# Patient Record
Sex: Female | Born: 1935 | Race: Black or African American | Hispanic: No | Marital: Single | Smoking: Never smoker
Health system: Southern US, Community
[De-identification: ages and names within clinical notes are randomized; demographics above are authoritative.]

## PROBLEM LIST (undated history)

## (undated) DIAGNOSIS — M5430 Sciatica, unspecified side: Secondary | ICD-10-CM

## (undated) DIAGNOSIS — E119 Type 2 diabetes mellitus without complications: Secondary | ICD-10-CM

## (undated) DIAGNOSIS — E785 Hyperlipidemia, unspecified: Secondary | ICD-10-CM

## (undated) DIAGNOSIS — M79606 Pain in leg, unspecified: Secondary | ICD-10-CM

## (undated) DIAGNOSIS — F0390 Unspecified dementia without behavioral disturbance: Secondary | ICD-10-CM

## (undated) DIAGNOSIS — E039 Hypothyroidism, unspecified: Secondary | ICD-10-CM

## (undated) DIAGNOSIS — I10 Essential (primary) hypertension: Secondary | ICD-10-CM

## (undated) DIAGNOSIS — I639 Cerebral infarction, unspecified: Secondary | ICD-10-CM

## (undated) DIAGNOSIS — K219 Gastro-esophageal reflux disease without esophagitis: Secondary | ICD-10-CM

## (undated) DIAGNOSIS — K449 Diaphragmatic hernia without obstruction or gangrene: Secondary | ICD-10-CM

## (undated) DIAGNOSIS — G5600 Carpal tunnel syndrome, unspecified upper limb: Secondary | ICD-10-CM

## (undated) DIAGNOSIS — R1310 Dysphagia, unspecified: Secondary | ICD-10-CM

## (undated) DIAGNOSIS — Z86010 Personal history of colonic polyps: Secondary | ICD-10-CM

## (undated) DIAGNOSIS — R42 Dizziness and giddiness: Secondary | ICD-10-CM

## (undated) DIAGNOSIS — B351 Tinea unguium: Principal | ICD-10-CM

## (undated) HISTORY — DX: Dizziness and giddiness: R42

## (undated) HISTORY — DX: Gastro-esophageal reflux disease without esophagitis: K21.9

## (undated) HISTORY — DX: Hyperlipidemia, unspecified: E78.5

## (undated) HISTORY — DX: Carpal tunnel syndrome, unspecified upper limb: G56.00

## (undated) HISTORY — DX: Pain in leg, unspecified: M79.606

## (undated) HISTORY — DX: Personal history of colonic polyps: Z86.010

## (undated) HISTORY — DX: Unspecified dementia, unspecified severity, without behavioral disturbance, psychotic disturbance, mood disturbance, and anxiety: F03.90

## (undated) HISTORY — DX: Sciatica, unspecified side: M54.30

## (undated) HISTORY — DX: Dysphagia, unspecified: R13.10

## (undated) HISTORY — PX: BLADDER SURGERY: SHX569

## (undated) HISTORY — PX: LARYNX SURGERY: SHX692

## (undated) HISTORY — DX: Tinea unguium: B35.1

## (undated) HISTORY — DX: Hypothyroidism, unspecified: E03.9

## (undated) HISTORY — PX: COLONOSCOPY: SHX174

---

## 1974-01-12 HISTORY — PX: VESICOVAGINAL FISTULA CLOSURE W/ TAH: SUR271

## 1976-01-13 HISTORY — PX: PARTIAL HYSTERECTOMY: SHX80

## 1997-03-29 ENCOUNTER — Ambulatory Visit (HOSPITAL_COMMUNITY): Admission: RE | Admit: 1997-03-29 | Discharge: 1997-03-29 | Payer: Self-pay | Admitting: Internal Medicine

## 1998-11-05 ENCOUNTER — Encounter: Payer: Self-pay | Admitting: Internal Medicine

## 1998-11-05 ENCOUNTER — Ambulatory Visit (HOSPITAL_COMMUNITY): Admission: RE | Admit: 1998-11-05 | Discharge: 1998-11-05 | Payer: Self-pay | Admitting: Internal Medicine

## 2000-07-03 ENCOUNTER — Encounter: Payer: Self-pay | Admitting: Emergency Medicine

## 2000-07-03 ENCOUNTER — Emergency Department (HOSPITAL_COMMUNITY): Admission: EM | Admit: 2000-07-03 | Discharge: 2000-07-03 | Payer: Self-pay

## 2001-02-09 ENCOUNTER — Ambulatory Visit (HOSPITAL_COMMUNITY): Admission: RE | Admit: 2001-02-09 | Discharge: 2001-02-09 | Payer: Self-pay | Admitting: Family Medicine

## 2001-02-09 ENCOUNTER — Encounter: Payer: Self-pay | Admitting: Family Medicine

## 2001-06-08 ENCOUNTER — Encounter: Payer: Self-pay | Admitting: Internal Medicine

## 2001-06-08 ENCOUNTER — Ambulatory Visit: Admission: RE | Admit: 2001-06-08 | Discharge: 2001-06-08 | Payer: Self-pay | Admitting: Internal Medicine

## 2001-07-02 ENCOUNTER — Ambulatory Visit (HOSPITAL_COMMUNITY): Admission: RE | Admit: 2001-07-02 | Discharge: 2001-07-02 | Payer: Self-pay | Admitting: Internal Medicine

## 2001-07-02 ENCOUNTER — Encounter: Payer: Self-pay | Admitting: Internal Medicine

## 2002-01-07 ENCOUNTER — Emergency Department (HOSPITAL_COMMUNITY): Admission: EM | Admit: 2002-01-07 | Discharge: 2002-01-07 | Payer: Self-pay | Admitting: *Deleted

## 2002-01-09 ENCOUNTER — Emergency Department (HOSPITAL_COMMUNITY): Admission: EM | Admit: 2002-01-09 | Discharge: 2002-01-09 | Payer: Self-pay | Admitting: Emergency Medicine

## 2002-01-09 ENCOUNTER — Encounter: Payer: Self-pay | Admitting: Emergency Medicine

## 2002-04-03 ENCOUNTER — Emergency Department (HOSPITAL_COMMUNITY): Admission: EM | Admit: 2002-04-03 | Discharge: 2002-04-03 | Payer: Self-pay | Admitting: Emergency Medicine

## 2002-06-09 ENCOUNTER — Emergency Department (HOSPITAL_COMMUNITY): Admission: EM | Admit: 2002-06-09 | Discharge: 2002-06-09 | Payer: Self-pay | Admitting: Emergency Medicine

## 2003-06-06 ENCOUNTER — Emergency Department (HOSPITAL_COMMUNITY): Admission: EM | Admit: 2003-06-06 | Discharge: 2003-06-07 | Payer: Self-pay | Admitting: Emergency Medicine

## 2003-11-22 ENCOUNTER — Ambulatory Visit: Payer: Self-pay | Admitting: Family Medicine

## 2003-12-03 ENCOUNTER — Ambulatory Visit (HOSPITAL_COMMUNITY): Admission: RE | Admit: 2003-12-03 | Discharge: 2003-12-03 | Payer: Self-pay | Admitting: Internal Medicine

## 2003-12-03 ENCOUNTER — Ambulatory Visit: Payer: Self-pay | Admitting: Family Medicine

## 2004-02-04 ENCOUNTER — Ambulatory Visit: Payer: Self-pay | Admitting: Family Medicine

## 2004-05-19 ENCOUNTER — Ambulatory Visit: Payer: Self-pay | Admitting: Family Medicine

## 2004-06-02 ENCOUNTER — Ambulatory Visit: Payer: Self-pay | Admitting: Family Medicine

## 2004-06-13 ENCOUNTER — Ambulatory Visit: Payer: Self-pay | Admitting: Family Medicine

## 2004-06-26 ENCOUNTER — Ambulatory Visit (HOSPITAL_COMMUNITY): Admission: RE | Admit: 2004-06-26 | Discharge: 2004-06-26 | Payer: Self-pay | Admitting: Family Medicine

## 2004-07-04 ENCOUNTER — Encounter: Admission: RE | Admit: 2004-07-04 | Discharge: 2004-07-04 | Payer: Self-pay | Admitting: Family Medicine

## 2004-08-27 ENCOUNTER — Emergency Department (HOSPITAL_COMMUNITY): Admission: EM | Admit: 2004-08-27 | Discharge: 2004-08-27 | Payer: Self-pay | Admitting: Family Medicine

## 2004-09-10 ENCOUNTER — Ambulatory Visit: Payer: Self-pay | Admitting: Family Medicine

## 2004-09-29 ENCOUNTER — Ambulatory Visit: Payer: Self-pay | Admitting: Nurse Practitioner

## 2004-10-09 ENCOUNTER — Ambulatory Visit: Payer: Self-pay | Admitting: Family Medicine

## 2005-01-27 ENCOUNTER — Ambulatory Visit: Payer: Self-pay | Admitting: Internal Medicine

## 2005-02-26 ENCOUNTER — Encounter: Admission: RE | Admit: 2005-02-26 | Discharge: 2005-02-26 | Payer: Self-pay | Admitting: Family Medicine

## 2005-03-05 ENCOUNTER — Ambulatory Visit: Payer: Self-pay | Admitting: Family Medicine

## 2005-03-30 ENCOUNTER — Ambulatory Visit: Payer: Self-pay | Admitting: Family Medicine

## 2005-06-02 ENCOUNTER — Ambulatory Visit: Payer: Self-pay | Admitting: Family Medicine

## 2005-08-25 ENCOUNTER — Encounter: Admission: RE | Admit: 2005-08-25 | Discharge: 2005-08-25 | Payer: Self-pay | Admitting: Family Medicine

## 2005-10-28 ENCOUNTER — Ambulatory Visit: Payer: Self-pay | Admitting: Family Medicine

## 2005-11-05 ENCOUNTER — Ambulatory Visit: Payer: Self-pay | Admitting: Family Medicine

## 2005-12-01 ENCOUNTER — Emergency Department (HOSPITAL_COMMUNITY): Admission: EM | Admit: 2005-12-01 | Discharge: 2005-12-01 | Payer: Self-pay | Admitting: Family Medicine

## 2006-02-24 ENCOUNTER — Ambulatory Visit: Payer: Self-pay | Admitting: Family Medicine

## 2006-06-23 DIAGNOSIS — K589 Irritable bowel syndrome without diarrhea: Secondary | ICD-10-CM | POA: Insufficient documentation

## 2006-06-23 DIAGNOSIS — E785 Hyperlipidemia, unspecified: Secondary | ICD-10-CM | POA: Insufficient documentation

## 2006-06-23 DIAGNOSIS — E119 Type 2 diabetes mellitus without complications: Secondary | ICD-10-CM | POA: Insufficient documentation

## 2006-06-23 DIAGNOSIS — E039 Hypothyroidism, unspecified: Secondary | ICD-10-CM | POA: Insufficient documentation

## 2006-06-23 DIAGNOSIS — I10 Essential (primary) hypertension: Secondary | ICD-10-CM | POA: Insufficient documentation

## 2006-06-23 DIAGNOSIS — K219 Gastro-esophageal reflux disease without esophagitis: Secondary | ICD-10-CM | POA: Insufficient documentation

## 2006-09-10 ENCOUNTER — Emergency Department (HOSPITAL_COMMUNITY): Admission: EM | Admit: 2006-09-10 | Discharge: 2006-09-10 | Payer: Self-pay | Admitting: Family Medicine

## 2006-09-29 ENCOUNTER — Ambulatory Visit: Payer: Self-pay | Admitting: Internal Medicine

## 2006-10-05 ENCOUNTER — Encounter: Admission: RE | Admit: 2006-10-05 | Discharge: 2006-10-05 | Payer: Self-pay | Admitting: Family Medicine

## 2007-03-15 ENCOUNTER — Emergency Department (HOSPITAL_COMMUNITY): Admission: EM | Admit: 2007-03-15 | Discharge: 2007-03-15 | Payer: Self-pay | Admitting: Emergency Medicine

## 2007-10-06 ENCOUNTER — Encounter: Admission: RE | Admit: 2007-10-06 | Discharge: 2007-10-06 | Payer: Self-pay | Admitting: Internal Medicine

## 2007-10-24 ENCOUNTER — Encounter: Admission: RE | Admit: 2007-10-24 | Discharge: 2007-10-24 | Payer: Self-pay | Admitting: Internal Medicine

## 2008-02-07 ENCOUNTER — Emergency Department (HOSPITAL_BASED_OUTPATIENT_CLINIC_OR_DEPARTMENT_OTHER): Admission: EM | Admit: 2008-02-07 | Discharge: 2008-02-07 | Payer: Self-pay | Admitting: Emergency Medicine

## 2008-02-07 ENCOUNTER — Ambulatory Visit: Payer: Self-pay | Admitting: Radiology

## 2008-09-09 ENCOUNTER — Emergency Department (HOSPITAL_COMMUNITY): Admission: EM | Admit: 2008-09-09 | Discharge: 2008-09-09 | Payer: Self-pay | Admitting: Emergency Medicine

## 2008-09-21 ENCOUNTER — Emergency Department (HOSPITAL_COMMUNITY): Admission: EM | Admit: 2008-09-21 | Discharge: 2008-09-21 | Payer: Self-pay | Admitting: Emergency Medicine

## 2008-10-15 ENCOUNTER — Encounter: Admission: RE | Admit: 2008-10-15 | Discharge: 2008-10-15 | Payer: Self-pay | Admitting: Internal Medicine

## 2009-07-08 ENCOUNTER — Emergency Department (HOSPITAL_COMMUNITY): Admission: EM | Admit: 2009-07-08 | Discharge: 2009-07-08 | Payer: Self-pay | Admitting: Emergency Medicine

## 2009-10-16 ENCOUNTER — Encounter: Admission: RE | Admit: 2009-10-16 | Discharge: 2009-10-16 | Payer: Self-pay | Admitting: Internal Medicine

## 2010-03-08 ENCOUNTER — Emergency Department (HOSPITAL_BASED_OUTPATIENT_CLINIC_OR_DEPARTMENT_OTHER)
Admission: EM | Admit: 2010-03-08 | Discharge: 2010-03-08 | Disposition: A | Discharge status: Home / Self Care | Payer: Medicare Other | Attending: Emergency Medicine | Admitting: Emergency Medicine

## 2010-03-08 DIAGNOSIS — R197 Diarrhea, unspecified: Secondary | ICD-10-CM | POA: Insufficient documentation

## 2010-03-08 DIAGNOSIS — K219 Gastro-esophageal reflux disease without esophagitis: Secondary | ICD-10-CM | POA: Insufficient documentation

## 2010-03-08 DIAGNOSIS — R6883 Chills (without fever): Secondary | ICD-10-CM | POA: Insufficient documentation

## 2010-03-08 DIAGNOSIS — E7800 Pure hypercholesterolemia, unspecified: Secondary | ICD-10-CM | POA: Insufficient documentation

## 2010-03-08 DIAGNOSIS — E039 Hypothyroidism, unspecified: Secondary | ICD-10-CM | POA: Insufficient documentation

## 2010-03-08 DIAGNOSIS — K5289 Other specified noninfective gastroenteritis and colitis: Secondary | ICD-10-CM | POA: Insufficient documentation

## 2010-03-08 DIAGNOSIS — I10 Essential (primary) hypertension: Secondary | ICD-10-CM | POA: Insufficient documentation

## 2010-03-08 DIAGNOSIS — R109 Unspecified abdominal pain: Secondary | ICD-10-CM | POA: Insufficient documentation

## 2010-03-08 DIAGNOSIS — E119 Type 2 diabetes mellitus without complications: Secondary | ICD-10-CM | POA: Insufficient documentation

## 2010-03-08 DIAGNOSIS — R1110 Vomiting, unspecified: Secondary | ICD-10-CM | POA: Insufficient documentation

## 2010-03-08 LAB — GLUCOSE, CAPILLARY: Glucose-Capillary: 201 mg/dL — ABNORMAL HIGH (ref 70–99)

## 2010-03-20 ENCOUNTER — Ambulatory Visit (INDEPENDENT_AMBULATORY_CARE_PROVIDER_SITE_OTHER): Payer: Medicare Other

## 2010-03-20 ENCOUNTER — Inpatient Hospital Stay (INDEPENDENT_AMBULATORY_CARE_PROVIDER_SITE_OTHER)
Admission: RE | Admit: 2010-03-20 | Discharge: 2010-03-20 | Disposition: A | Discharge status: Home / Self Care | Payer: Medicare Other | Source: Ambulatory Visit | Attending: Emergency Medicine | Admitting: Emergency Medicine

## 2010-03-20 DIAGNOSIS — T148XXA Other injury of unspecified body region, initial encounter: Secondary | ICD-10-CM

## 2010-04-15 ENCOUNTER — Ambulatory Visit (HOSPITAL_BASED_OUTPATIENT_CLINIC_OR_DEPARTMENT_OTHER)
Admission: RE | Admit: 2010-04-15 | Discharge: 2010-04-15 | Disposition: A | Discharge status: Home / Self Care | Payer: Medicare Other | Source: Ambulatory Visit | Attending: Internal Medicine | Admitting: Internal Medicine

## 2010-04-15 ENCOUNTER — Other Ambulatory Visit (HOSPITAL_BASED_OUTPATIENT_CLINIC_OR_DEPARTMENT_OTHER): Payer: Self-pay | Admitting: Internal Medicine

## 2010-04-15 DIAGNOSIS — M79669 Pain in unspecified lower leg: Secondary | ICD-10-CM

## 2010-04-15 DIAGNOSIS — R609 Edema, unspecified: Secondary | ICD-10-CM | POA: Insufficient documentation

## 2010-04-15 DIAGNOSIS — M79609 Pain in unspecified limb: Secondary | ICD-10-CM

## 2010-04-15 DIAGNOSIS — R252 Cramp and spasm: Secondary | ICD-10-CM | POA: Insufficient documentation

## 2010-09-22 ENCOUNTER — Inpatient Hospital Stay (INDEPENDENT_AMBULATORY_CARE_PROVIDER_SITE_OTHER)
Admission: RE | Admit: 2010-09-22 | Discharge: 2010-09-22 | Disposition: A | Discharge status: Home / Self Care | Payer: Medicare Other | Source: Ambulatory Visit | Attending: Family Medicine | Admitting: Family Medicine

## 2010-09-22 DIAGNOSIS — L259 Unspecified contact dermatitis, unspecified cause: Secondary | ICD-10-CM

## 2010-09-26 ENCOUNTER — Other Ambulatory Visit: Payer: Self-pay | Admitting: Internal Medicine

## 2010-09-26 DIAGNOSIS — Z1231 Encounter for screening mammogram for malignant neoplasm of breast: Secondary | ICD-10-CM

## 2010-10-21 ENCOUNTER — Ambulatory Visit
Admission: RE | Admit: 2010-10-21 | Discharge: 2010-10-21 | Disposition: A | Discharge status: Home / Self Care | Payer: Medicare Other | Source: Ambulatory Visit | Attending: Internal Medicine | Admitting: Internal Medicine

## 2010-10-21 DIAGNOSIS — Z1231 Encounter for screening mammogram for malignant neoplasm of breast: Secondary | ICD-10-CM

## 2010-11-04 ENCOUNTER — Ambulatory Visit: Payer: Medicare Other | Admitting: *Deleted

## 2010-11-11 ENCOUNTER — Encounter: Payer: Medicare Other | Attending: Internal Medicine | Admitting: *Deleted

## 2010-11-11 ENCOUNTER — Encounter: Payer: Self-pay | Admitting: *Deleted

## 2010-11-11 DIAGNOSIS — Z713 Dietary counseling and surveillance: Secondary | ICD-10-CM | POA: Insufficient documentation

## 2010-11-11 DIAGNOSIS — E119 Type 2 diabetes mellitus without complications: Secondary | ICD-10-CM

## 2010-11-11 NOTE — Progress Notes (Signed)
  Medical Nutrition Therapy:  Appt start time: 1130 end time:  1230.   Assessment:  Primary concerns today: Nutrition counseling to review management options for Type 2 Diabetes. Patient states she has has Diabetes for over 20 years but needs refresher. She has a Primary school teacher but doesn't know how to use it. She has a physically active lifestyle but not an exercise routine. She eats 3 meals/day and snacks occasionally. She takes her diabetes meds in the AM but often omits the evening dose, too busy and kind of afraid to take it when heading to bed.  MEDICATIONS: see list. Diabetes med is Glucovance5-500 mg, 2 tabs BID   DIETARY INTAKE:  Usual eating pattern includes 3 meals and 3 snacks per day.  Everyday foods include good variety of all food groups plus regular soda daily.    24-hr recall:  B ( AM): oats or grits, toast and coffee. Enjoys orange juice  Snk ( AM): applesauce  L ( PM): left overs or cereal with milk and a banana Snk ( PM): sardines with vinegar, regular Pepsi D ( PM): meat, starch, vegetable meal, enjoys fish Snk ( PM): grilled cheese sandwich, fruit, regular Pepsi Beverages: regular Pepsi, water, tea, water  Usual physical activity: daily living such as house and yard work  Estimated energy needs: 1600 calories 180 g carbohydrates 120 g protein 44 g fat  Progress Towards Goal(s):  In progress.   Nutritional Diagnosis:  NI-5.8.2 Excessive carbohydrate intake As related to intake of regular soda and multiple high carb foods.  As evidenced by  A1c of 9.9%.    Intervention:  Nutrition counseling on physiology of diabetes, relationship of carbohydrate and insulin to BG numbers and A1c, value of testing BG daily at various times of day and action of her Glucovance encouraging her to take it as prescribed.  Handouts given during visit include:  Where Do I Begin by ADA  Monitoring/Evaluation:  Dietary intake of 3 Carb Choices per meal, exercise as usual,  avoidance of regular soda, and body weight in 2 week(s).

## 2011-02-18 DIAGNOSIS — B351 Tinea unguium: Secondary | ICD-10-CM | POA: Diagnosis not present

## 2011-02-18 DIAGNOSIS — L608 Other nail disorders: Secondary | ICD-10-CM | POA: Diagnosis not present

## 2011-02-18 DIAGNOSIS — E119 Type 2 diabetes mellitus without complications: Secondary | ICD-10-CM | POA: Diagnosis not present

## 2011-02-18 DIAGNOSIS — M79609 Pain in unspecified limb: Secondary | ICD-10-CM | POA: Diagnosis not present

## 2011-02-20 DIAGNOSIS — R3913 Splitting of urinary stream: Secondary | ICD-10-CM | POA: Diagnosis not present

## 2011-02-20 DIAGNOSIS — N8111 Cystocele, midline: Secondary | ICD-10-CM | POA: Diagnosis not present

## 2011-02-20 DIAGNOSIS — N952 Postmenopausal atrophic vaginitis: Secondary | ICD-10-CM | POA: Diagnosis not present

## 2011-04-02 DIAGNOSIS — E1142 Type 2 diabetes mellitus with diabetic polyneuropathy: Secondary | ICD-10-CM | POA: Diagnosis not present

## 2011-04-02 DIAGNOSIS — E1159 Type 2 diabetes mellitus with other circulatory complications: Secondary | ICD-10-CM | POA: Diagnosis not present

## 2011-04-02 DIAGNOSIS — J309 Allergic rhinitis, unspecified: Secondary | ICD-10-CM | POA: Diagnosis not present

## 2011-04-02 DIAGNOSIS — IMO0001 Reserved for inherently not codable concepts without codable children: Secondary | ICD-10-CM | POA: Diagnosis not present

## 2011-04-02 DIAGNOSIS — K219 Gastro-esophageal reflux disease without esophagitis: Secondary | ICD-10-CM | POA: Diagnosis not present

## 2011-04-02 DIAGNOSIS — E785 Hyperlipidemia, unspecified: Secondary | ICD-10-CM | POA: Diagnosis not present

## 2011-04-02 DIAGNOSIS — E039 Hypothyroidism, unspecified: Secondary | ICD-10-CM | POA: Diagnosis not present

## 2011-04-02 DIAGNOSIS — I10 Essential (primary) hypertension: Secondary | ICD-10-CM | POA: Diagnosis not present

## 2011-04-08 DIAGNOSIS — R339 Retention of urine, unspecified: Secondary | ICD-10-CM | POA: Diagnosis not present

## 2011-04-08 DIAGNOSIS — N8111 Cystocele, midline: Secondary | ICD-10-CM | POA: Diagnosis not present

## 2011-04-08 DIAGNOSIS — E119 Type 2 diabetes mellitus without complications: Secondary | ICD-10-CM | POA: Diagnosis not present

## 2011-04-16 DIAGNOSIS — IMO0001 Reserved for inherently not codable concepts without codable children: Secondary | ICD-10-CM | POA: Diagnosis not present

## 2011-04-16 DIAGNOSIS — E785 Hyperlipidemia, unspecified: Secondary | ICD-10-CM | POA: Diagnosis not present

## 2011-04-16 DIAGNOSIS — E1142 Type 2 diabetes mellitus with diabetic polyneuropathy: Secondary | ICD-10-CM | POA: Diagnosis not present

## 2011-04-16 DIAGNOSIS — E039 Hypothyroidism, unspecified: Secondary | ICD-10-CM | POA: Diagnosis not present

## 2011-04-16 DIAGNOSIS — K219 Gastro-esophageal reflux disease without esophagitis: Secondary | ICD-10-CM | POA: Diagnosis not present

## 2011-04-16 DIAGNOSIS — J309 Allergic rhinitis, unspecified: Secondary | ICD-10-CM | POA: Diagnosis not present

## 2011-04-16 DIAGNOSIS — I10 Essential (primary) hypertension: Secondary | ICD-10-CM | POA: Diagnosis not present

## 2011-05-13 DIAGNOSIS — M79609 Pain in unspecified limb: Secondary | ICD-10-CM | POA: Diagnosis not present

## 2011-05-13 DIAGNOSIS — L608 Other nail disorders: Secondary | ICD-10-CM | POA: Diagnosis not present

## 2011-05-13 DIAGNOSIS — L600 Ingrowing nail: Secondary | ICD-10-CM | POA: Diagnosis not present

## 2011-05-13 DIAGNOSIS — E119 Type 2 diabetes mellitus without complications: Secondary | ICD-10-CM | POA: Diagnosis not present

## 2011-05-22 DIAGNOSIS — N8111 Cystocele, midline: Secondary | ICD-10-CM | POA: Diagnosis not present

## 2011-05-22 DIAGNOSIS — R339 Retention of urine, unspecified: Secondary | ICD-10-CM | POA: Diagnosis not present

## 2011-05-22 DIAGNOSIS — N993 Prolapse of vaginal vault after hysterectomy: Secondary | ICD-10-CM | POA: Diagnosis not present

## 2011-06-04 DIAGNOSIS — Z90710 Acquired absence of both cervix and uterus: Secondary | ICD-10-CM | POA: Diagnosis not present

## 2011-06-04 DIAGNOSIS — R32 Unspecified urinary incontinence: Secondary | ICD-10-CM | POA: Insufficient documentation

## 2011-06-04 DIAGNOSIS — K219 Gastro-esophageal reflux disease without esophagitis: Secondary | ICD-10-CM | POA: Diagnosis not present

## 2011-06-04 DIAGNOSIS — F411 Generalized anxiety disorder: Secondary | ICD-10-CM | POA: Diagnosis not present

## 2011-06-04 DIAGNOSIS — Z01818 Encounter for other preprocedural examination: Secondary | ICD-10-CM | POA: Diagnosis not present

## 2011-06-04 DIAGNOSIS — Z79899 Other long term (current) drug therapy: Secondary | ICD-10-CM | POA: Diagnosis not present

## 2011-06-04 DIAGNOSIS — E119 Type 2 diabetes mellitus without complications: Secondary | ICD-10-CM | POA: Diagnosis not present

## 2011-06-04 DIAGNOSIS — E785 Hyperlipidemia, unspecified: Secondary | ICD-10-CM | POA: Diagnosis not present

## 2011-06-04 DIAGNOSIS — R339 Retention of urine, unspecified: Secondary | ICD-10-CM | POA: Diagnosis not present

## 2011-06-04 DIAGNOSIS — E039 Hypothyroidism, unspecified: Secondary | ICD-10-CM | POA: Diagnosis not present

## 2011-06-04 DIAGNOSIS — F329 Major depressive disorder, single episode, unspecified: Secondary | ICD-10-CM | POA: Diagnosis not present

## 2011-06-04 DIAGNOSIS — I10 Essential (primary) hypertension: Secondary | ICD-10-CM | POA: Diagnosis not present

## 2011-06-04 DIAGNOSIS — K5900 Constipation, unspecified: Secondary | ICD-10-CM | POA: Diagnosis not present

## 2011-06-04 DIAGNOSIS — Z885 Allergy status to narcotic agent status: Secondary | ICD-10-CM | POA: Diagnosis not present

## 2011-06-04 DIAGNOSIS — M5430 Sciatica, unspecified side: Secondary | ICD-10-CM | POA: Diagnosis not present

## 2011-06-04 DIAGNOSIS — Z01810 Encounter for preprocedural cardiovascular examination: Secondary | ICD-10-CM | POA: Diagnosis not present

## 2011-06-04 DIAGNOSIS — N8111 Cystocele, midline: Secondary | ICD-10-CM | POA: Diagnosis not present

## 2011-06-04 DIAGNOSIS — Z880 Allergy status to penicillin: Secondary | ICD-10-CM | POA: Diagnosis not present

## 2011-06-04 DIAGNOSIS — F32A Depression, unspecified: Secondary | ICD-10-CM | POA: Insufficient documentation

## 2011-06-04 DIAGNOSIS — F3289 Other specified depressive episodes: Secondary | ICD-10-CM | POA: Diagnosis not present

## 2011-06-09 DIAGNOSIS — J309 Allergic rhinitis, unspecified: Secondary | ICD-10-CM | POA: Diagnosis not present

## 2011-06-09 DIAGNOSIS — R059 Cough, unspecified: Secondary | ICD-10-CM | POA: Diagnosis not present

## 2011-06-09 DIAGNOSIS — E785 Hyperlipidemia, unspecified: Secondary | ICD-10-CM | POA: Diagnosis not present

## 2011-06-09 DIAGNOSIS — E1142 Type 2 diabetes mellitus with diabetic polyneuropathy: Secondary | ICD-10-CM | POA: Diagnosis not present

## 2011-06-09 DIAGNOSIS — I10 Essential (primary) hypertension: Secondary | ICD-10-CM | POA: Diagnosis not present

## 2011-06-09 DIAGNOSIS — K219 Gastro-esophageal reflux disease without esophagitis: Secondary | ICD-10-CM | POA: Diagnosis not present

## 2011-06-09 DIAGNOSIS — E039 Hypothyroidism, unspecified: Secondary | ICD-10-CM | POA: Diagnosis not present

## 2011-06-09 DIAGNOSIS — R05 Cough: Secondary | ICD-10-CM | POA: Diagnosis not present

## 2011-06-09 DIAGNOSIS — IMO0001 Reserved for inherently not codable concepts without codable children: Secondary | ICD-10-CM | POA: Diagnosis not present

## 2011-06-11 DIAGNOSIS — R339 Retention of urine, unspecified: Secondary | ICD-10-CM | POA: Diagnosis not present

## 2011-06-11 DIAGNOSIS — F3289 Other specified depressive episodes: Secondary | ICD-10-CM | POA: Diagnosis not present

## 2011-06-11 DIAGNOSIS — I10 Essential (primary) hypertension: Secondary | ICD-10-CM | POA: Diagnosis not present

## 2011-06-11 DIAGNOSIS — E785 Hyperlipidemia, unspecified: Secondary | ICD-10-CM | POA: Diagnosis not present

## 2011-06-11 DIAGNOSIS — F411 Generalized anxiety disorder: Secondary | ICD-10-CM | POA: Diagnosis not present

## 2011-06-11 DIAGNOSIS — F329 Major depressive disorder, single episode, unspecified: Secondary | ICD-10-CM | POA: Diagnosis not present

## 2011-06-11 DIAGNOSIS — IMO0002 Reserved for concepts with insufficient information to code with codable children: Secondary | ICD-10-CM | POA: Insufficient documentation

## 2011-06-11 DIAGNOSIS — E039 Hypothyroidism, unspecified: Secondary | ICD-10-CM | POA: Diagnosis not present

## 2011-06-11 DIAGNOSIS — K219 Gastro-esophageal reflux disease without esophagitis: Secondary | ICD-10-CM | POA: Diagnosis not present

## 2011-06-11 DIAGNOSIS — E119 Type 2 diabetes mellitus without complications: Secondary | ICD-10-CM | POA: Diagnosis not present

## 2011-06-11 DIAGNOSIS — N8111 Cystocele, midline: Secondary | ICD-10-CM | POA: Diagnosis not present

## 2011-06-12 DIAGNOSIS — I10 Essential (primary) hypertension: Secondary | ICD-10-CM | POA: Diagnosis not present

## 2011-06-12 DIAGNOSIS — K219 Gastro-esophageal reflux disease without esophagitis: Secondary | ICD-10-CM | POA: Diagnosis not present

## 2011-06-12 DIAGNOSIS — R339 Retention of urine, unspecified: Secondary | ICD-10-CM | POA: Diagnosis not present

## 2011-06-12 DIAGNOSIS — N8111 Cystocele, midline: Secondary | ICD-10-CM | POA: Diagnosis not present

## 2011-06-12 DIAGNOSIS — E119 Type 2 diabetes mellitus without complications: Secondary | ICD-10-CM | POA: Diagnosis not present

## 2011-06-12 DIAGNOSIS — F411 Generalized anxiety disorder: Secondary | ICD-10-CM | POA: Diagnosis not present

## 2011-06-16 DIAGNOSIS — N8111 Cystocele, midline: Secondary | ICD-10-CM | POA: Diagnosis not present

## 2011-06-17 DIAGNOSIS — I10 Essential (primary) hypertension: Secondary | ICD-10-CM | POA: Diagnosis not present

## 2011-06-17 DIAGNOSIS — I119 Hypertensive heart disease without heart failure: Secondary | ICD-10-CM | POA: Diagnosis not present

## 2011-06-30 DIAGNOSIS — I10 Essential (primary) hypertension: Secondary | ICD-10-CM | POA: Diagnosis not present

## 2011-06-30 DIAGNOSIS — E1142 Type 2 diabetes mellitus with diabetic polyneuropathy: Secondary | ICD-10-CM | POA: Diagnosis not present

## 2011-06-30 DIAGNOSIS — J309 Allergic rhinitis, unspecified: Secondary | ICD-10-CM | POA: Diagnosis not present

## 2011-06-30 DIAGNOSIS — IMO0001 Reserved for inherently not codable concepts without codable children: Secondary | ICD-10-CM | POA: Diagnosis not present

## 2011-06-30 DIAGNOSIS — E039 Hypothyroidism, unspecified: Secondary | ICD-10-CM | POA: Diagnosis not present

## 2011-06-30 DIAGNOSIS — E785 Hyperlipidemia, unspecified: Secondary | ICD-10-CM | POA: Diagnosis not present

## 2011-06-30 DIAGNOSIS — K219 Gastro-esophageal reflux disease without esophagitis: Secondary | ICD-10-CM | POA: Diagnosis not present

## 2011-07-20 DIAGNOSIS — R32 Unspecified urinary incontinence: Secondary | ICD-10-CM | POA: Diagnosis not present

## 2011-07-20 DIAGNOSIS — N390 Urinary tract infection, site not specified: Secondary | ICD-10-CM | POA: Diagnosis not present

## 2011-07-28 DIAGNOSIS — E039 Hypothyroidism, unspecified: Secondary | ICD-10-CM | POA: Diagnosis not present

## 2011-07-28 DIAGNOSIS — J309 Allergic rhinitis, unspecified: Secondary | ICD-10-CM | POA: Diagnosis not present

## 2011-07-28 DIAGNOSIS — I10 Essential (primary) hypertension: Secondary | ICD-10-CM | POA: Diagnosis not present

## 2011-07-28 DIAGNOSIS — E785 Hyperlipidemia, unspecified: Secondary | ICD-10-CM | POA: Diagnosis not present

## 2011-07-28 DIAGNOSIS — K219 Gastro-esophageal reflux disease without esophagitis: Secondary | ICD-10-CM | POA: Diagnosis not present

## 2011-07-28 DIAGNOSIS — E1142 Type 2 diabetes mellitus with diabetic polyneuropathy: Secondary | ICD-10-CM | POA: Diagnosis not present

## 2011-07-28 DIAGNOSIS — IMO0001 Reserved for inherently not codable concepts without codable children: Secondary | ICD-10-CM | POA: Diagnosis not present

## 2011-08-12 DIAGNOSIS — R32 Unspecified urinary incontinence: Secondary | ICD-10-CM | POA: Diagnosis not present

## 2011-09-15 ENCOUNTER — Other Ambulatory Visit: Payer: Self-pay | Admitting: Internal Medicine

## 2011-09-15 DIAGNOSIS — Z1231 Encounter for screening mammogram for malignant neoplasm of breast: Secondary | ICD-10-CM

## 2011-09-18 DIAGNOSIS — M79609 Pain in unspecified limb: Secondary | ICD-10-CM | POA: Diagnosis not present

## 2011-09-18 DIAGNOSIS — E109 Type 1 diabetes mellitus without complications: Secondary | ICD-10-CM | POA: Diagnosis not present

## 2011-09-18 DIAGNOSIS — L608 Other nail disorders: Secondary | ICD-10-CM | POA: Diagnosis not present

## 2011-09-30 DIAGNOSIS — R339 Retention of urine, unspecified: Secondary | ICD-10-CM | POA: Diagnosis not present

## 2011-09-30 DIAGNOSIS — R351 Nocturia: Secondary | ICD-10-CM | POA: Diagnosis not present

## 2011-10-01 DIAGNOSIS — J309 Allergic rhinitis, unspecified: Secondary | ICD-10-CM | POA: Diagnosis not present

## 2011-10-01 DIAGNOSIS — E785 Hyperlipidemia, unspecified: Secondary | ICD-10-CM | POA: Diagnosis not present

## 2011-10-01 DIAGNOSIS — E039 Hypothyroidism, unspecified: Secondary | ICD-10-CM | POA: Diagnosis not present

## 2011-10-01 DIAGNOSIS — Z23 Encounter for immunization: Secondary | ICD-10-CM | POA: Diagnosis not present

## 2011-10-01 DIAGNOSIS — K219 Gastro-esophageal reflux disease without esophagitis: Secondary | ICD-10-CM | POA: Diagnosis not present

## 2011-10-01 DIAGNOSIS — IMO0001 Reserved for inherently not codable concepts without codable children: Secondary | ICD-10-CM | POA: Diagnosis not present

## 2011-10-01 DIAGNOSIS — I10 Essential (primary) hypertension: Secondary | ICD-10-CM | POA: Diagnosis not present

## 2011-10-01 DIAGNOSIS — E1142 Type 2 diabetes mellitus with diabetic polyneuropathy: Secondary | ICD-10-CM | POA: Diagnosis not present

## 2011-10-22 ENCOUNTER — Ambulatory Visit
Admission: RE | Admit: 2011-10-22 | Discharge: 2011-10-22 | Disposition: A | Discharge status: Home / Self Care | Payer: Medicare Other | Source: Ambulatory Visit | Attending: Internal Medicine | Admitting: Internal Medicine

## 2011-10-22 DIAGNOSIS — Z1231 Encounter for screening mammogram for malignant neoplasm of breast: Secondary | ICD-10-CM | POA: Diagnosis not present

## 2011-11-19 DIAGNOSIS — E119 Type 2 diabetes mellitus without complications: Secondary | ICD-10-CM | POA: Diagnosis not present

## 2011-11-19 DIAGNOSIS — E039 Hypothyroidism, unspecified: Secondary | ICD-10-CM | POA: Diagnosis not present

## 2011-11-19 DIAGNOSIS — E1142 Type 2 diabetes mellitus with diabetic polyneuropathy: Secondary | ICD-10-CM | POA: Diagnosis not present

## 2011-11-19 DIAGNOSIS — K219 Gastro-esophageal reflux disease without esophagitis: Secondary | ICD-10-CM | POA: Diagnosis not present

## 2011-11-19 DIAGNOSIS — I739 Peripheral vascular disease, unspecified: Secondary | ICD-10-CM | POA: Diagnosis not present

## 2011-11-19 DIAGNOSIS — I150 Renovascular hypertension: Secondary | ICD-10-CM | POA: Diagnosis not present

## 2011-11-19 DIAGNOSIS — E559 Vitamin D deficiency, unspecified: Secondary | ICD-10-CM | POA: Diagnosis not present

## 2011-11-19 DIAGNOSIS — E785 Hyperlipidemia, unspecified: Secondary | ICD-10-CM | POA: Diagnosis not present

## 2011-11-19 DIAGNOSIS — IMO0001 Reserved for inherently not codable concepts without codable children: Secondary | ICD-10-CM | POA: Diagnosis not present

## 2011-11-19 DIAGNOSIS — I10 Essential (primary) hypertension: Secondary | ICD-10-CM | POA: Diagnosis not present

## 2011-11-19 DIAGNOSIS — E1159 Type 2 diabetes mellitus with other circulatory complications: Secondary | ICD-10-CM | POA: Diagnosis not present

## 2011-11-19 DIAGNOSIS — I5030 Unspecified diastolic (congestive) heart failure: Secondary | ICD-10-CM | POA: Diagnosis not present

## 2011-11-26 DIAGNOSIS — IMO0001 Reserved for inherently not codable concepts without codable children: Secondary | ICD-10-CM | POA: Diagnosis not present

## 2011-11-26 DIAGNOSIS — J309 Allergic rhinitis, unspecified: Secondary | ICD-10-CM | POA: Diagnosis not present

## 2011-11-26 DIAGNOSIS — I10 Essential (primary) hypertension: Secondary | ICD-10-CM | POA: Diagnosis not present

## 2011-11-26 DIAGNOSIS — K219 Gastro-esophageal reflux disease without esophagitis: Secondary | ICD-10-CM | POA: Diagnosis not present

## 2011-11-26 DIAGNOSIS — E1142 Type 2 diabetes mellitus with diabetic polyneuropathy: Secondary | ICD-10-CM | POA: Diagnosis not present

## 2011-11-26 DIAGNOSIS — E039 Hypothyroidism, unspecified: Secondary | ICD-10-CM | POA: Diagnosis not present

## 2011-11-26 DIAGNOSIS — E785 Hyperlipidemia, unspecified: Secondary | ICD-10-CM | POA: Diagnosis not present

## 2011-12-07 DIAGNOSIS — R339 Retention of urine, unspecified: Secondary | ICD-10-CM | POA: Diagnosis not present

## 2011-12-13 DIAGNOSIS — R911 Solitary pulmonary nodule: Secondary | ICD-10-CM | POA: Diagnosis not present

## 2011-12-13 DIAGNOSIS — R918 Other nonspecific abnormal finding of lung field: Secondary | ICD-10-CM | POA: Diagnosis not present

## 2011-12-13 DIAGNOSIS — R05 Cough: Secondary | ICD-10-CM | POA: Diagnosis not present

## 2011-12-13 DIAGNOSIS — R0602 Shortness of breath: Secondary | ICD-10-CM | POA: Diagnosis not present

## 2011-12-13 DIAGNOSIS — R1310 Dysphagia, unspecified: Secondary | ICD-10-CM | POA: Diagnosis not present

## 2011-12-13 DIAGNOSIS — R059 Cough, unspecified: Secondary | ICD-10-CM | POA: Diagnosis not present

## 2011-12-16 DIAGNOSIS — H25099 Other age-related incipient cataract, unspecified eye: Secondary | ICD-10-CM | POA: Diagnosis not present

## 2011-12-16 DIAGNOSIS — E119 Type 2 diabetes mellitus without complications: Secondary | ICD-10-CM | POA: Diagnosis not present

## 2011-12-16 DIAGNOSIS — H11159 Pinguecula, unspecified eye: Secondary | ICD-10-CM | POA: Diagnosis not present

## 2011-12-23 ENCOUNTER — Encounter: Payer: Self-pay | Admitting: *Deleted

## 2011-12-24 ENCOUNTER — Ambulatory Visit (INDEPENDENT_AMBULATORY_CARE_PROVIDER_SITE_OTHER): Payer: Medicare Other | Admitting: Internal Medicine

## 2011-12-24 ENCOUNTER — Encounter: Payer: Self-pay | Admitting: Internal Medicine

## 2011-12-24 VITALS — BP 124/74 | HR 88 | Temp 97.8°F | Ht 64.0 in | Wt 159.8 lb

## 2011-12-24 DIAGNOSIS — K219 Gastro-esophageal reflux disease without esophagitis: Secondary | ICD-10-CM | POA: Diagnosis not present

## 2011-12-24 DIAGNOSIS — R918 Other nonspecific abnormal finding of lung field: Secondary | ICD-10-CM | POA: Diagnosis not present

## 2011-12-24 DIAGNOSIS — R9389 Abnormal findings on diagnostic imaging of other specified body structures: Secondary | ICD-10-CM

## 2011-12-24 NOTE — Progress Notes (Signed)
  Subjective:    Patient ID: Allison [name], female    DOB: [birth year]  MRN: [id number]  HPI  6 yobf never smoked no significant resp problems although exposed to second smoke and turned positive skin test and [facility] referred by urgent care for eval of abn cxr taken p spell of sob.   12/24/2011 1st pulmonary eval cc abupt onset Dec 13 2011 of choking p supper despite omeprazole 20 mg with breakfast so increased to 2 daily and some better since. No sob x during choking spell, No obvious daytime variabilty or assoc chronic cough or cp or chest tightness, subjective wheeze overt sinus   symptoms. No unusual exp hx or h/o childhood pna/ asthma or premature birth to her knowledge.   Sleeping ok without nocturnal  or early am exacerbation  of respiratory  c/o's or need for noct saba. Also denies any obvious fluctuation of symptoms with weather or environmental changes or other aggravating or alleviating factors except as outlined above   Review of Systems  Constitutional: Positive for unexpected weight change. Negative for fever and chills.  HENT: Positive for trouble swallowing. Negative for ear pain, nosebleeds, congestion, sore throat, rhinorrhea, sneezing, dental problem, voice change, postnasal drip and sinus pressure.   Eyes: Negative for visual disturbance.  Respiratory: Positive for shortness of breath. Negative for cough and choking.   Cardiovascular: Negative for chest pain and leg swelling.  Gastrointestinal: Positive for abdominal pain. Negative for vomiting and diarrhea.  Genitourinary: Negative for difficulty urinating.  Musculoskeletal: Negative for arthralgias.  Skin: Negative for rash.  Neurological: Negative for tremors, syncope and headaches.  Hematological: Does not bruise/bleed easily.       Objective:   Physical Exam Pleasant amb bf nad Wt Readings from Last 3 Encounters:  12/24/11 159 lb 12.8 oz (72.485 kg)  11/11/10 166 lb (75.297 kg)     HEENT: nl dentition, turbinates, and orophanx. Nl external ear canals without cough reflex   NECK :  without JVD/Nodes/TM/ nl carotid upstrokes bilaterally   LUNGS: no acc muscle use, clear to A and P bilaterally without cough on insp or exp maneuvers   CV:  RRR  no s3 or murmur or increase in P2, no edema   ABD:  soft and nontender with nl excursion in the supine position. No bruits or organomegaly, bowel sounds nl  MS:  warm without deformities, calf tenderness, cyanosis or clubbing  SKIN: warm and dry without lesions    NEURO:  alert, approp, no deficits    12/13/11 cxr report/ reviewed scan Possible small nodules at lateral left lung base     Assessment & Plan:

## 2011-12-24 NOTE — Patient Instructions (Addendum)
Change prilosec to 20 mg and take 2 about 30 min to an hour  Before bfast and if you continue to have any problems with swallowing or clearing your throat take one before supper too  GERD (REFLUX)  is an extremely common cause of respiratory symptoms, many times with no significant heartburn at all.    It can be treated with medication, but also with lifestyle changes including avoidance of late meals, excessive alcohol, smoking cessation, and avoid fatty foods, chocolate, peppermint, colas, red wine, and acidic juices such as orange juice.  NO MINT OR MENTHOL PRODUCTS SO NO COUGH DROPS  USE SUGARLESS CANDY INSTEAD (jolley ranchers or Stover's)  NO OIL BASED VITAMINS - use powdered substitutes.  Please schedule a follow up visit in 3 months but call sooner if needed with cxr Late add:  cxr reviewed, needs f/u in 6 weeks.

## 2011-12-25 DIAGNOSIS — K219 Gastro-esophageal reflux disease without esophagitis: Secondary | ICD-10-CM | POA: Insufficient documentation

## 2011-12-25 DIAGNOSIS — R9389 Abnormal findings on diagnostic imaging of other specified body structures: Secondary | ICD-10-CM | POA: Insufficient documentation

## 2011-12-25 NOTE — Assessment & Plan Note (Addendum)
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  Outside films reviewed with no baseline > she has sev vague denisities in LLL inferiorly near the superior sulcus that will need f/u but certainly don't look worrisome or in need for further immediate eval and are not at all likely related to her spells.  Will place in tickle file for recall in 6 weeks

## 2011-12-25 NOTE — Assessment & Plan Note (Signed)
Her choking spell and other upper airway symptoms are typical of extraesophageal gerd and should be treated aggressively with max PPI and diet and then consider formal GI eval p holidays if not improving.

## 2011-12-29 DIAGNOSIS — M79609 Pain in unspecified limb: Secondary | ICD-10-CM | POA: Diagnosis not present

## 2011-12-29 DIAGNOSIS — E109 Type 1 diabetes mellitus without complications: Secondary | ICD-10-CM | POA: Diagnosis not present

## 2011-12-29 DIAGNOSIS — B351 Tinea unguium: Secondary | ICD-10-CM | POA: Diagnosis not present

## 2012-01-12 ENCOUNTER — Telehealth: Payer: Self-pay | Admitting: *Deleted

## 2012-01-12 NOTE — Telephone Encounter (Signed)
Spoke with pt and scheduled ov with MW of 02/01/12

## 2012-01-12 NOTE — Telephone Encounter (Signed)
Message copied by Christen Butter on Tue Jan 12, 2012  1:49 PM ------      Message from: Sandrea Hughs B      Created: Fri Dec 25, 2011  6:41 AM       Move up her cxr / ov to this week

## 2012-02-01 ENCOUNTER — Ambulatory Visit (INDEPENDENT_AMBULATORY_CARE_PROVIDER_SITE_OTHER)
Admission: RE | Admit: 2012-02-01 | Discharge: 2012-02-01 | Disposition: A | Discharge status: Home / Self Care | Payer: Medicare Other | Source: Ambulatory Visit | Attending: Internal Medicine | Admitting: Internal Medicine

## 2012-02-01 ENCOUNTER — Ambulatory Visit (INDEPENDENT_AMBULATORY_CARE_PROVIDER_SITE_OTHER): Payer: Medicare Other | Admitting: Internal Medicine

## 2012-02-01 DIAGNOSIS — R9389 Abnormal findings on diagnostic imaging of other specified body structures: Secondary | ICD-10-CM

## 2012-02-01 DIAGNOSIS — J8410 Pulmonary fibrosis, unspecified: Secondary | ICD-10-CM | POA: Diagnosis not present

## 2012-02-01 DIAGNOSIS — R918 Other nonspecific abnormal finding of lung field: Secondary | ICD-10-CM

## 2012-02-02 NOTE — Progress Notes (Signed)
Quick Note:  Spoke with pt and notified of results per Dr. Wert. Pt verbalized understanding and denied any questions.  ______ 

## 2012-02-11 ENCOUNTER — Telehealth: Payer: Self-pay | Admitting: Internal Medicine

## 2012-02-11 NOTE — Telephone Encounter (Signed)
Notes Recorded by Christen Butter, CMA on 02/02/2012 at 11:07 AM Spoke with pt and notified of results per Dr. Sherene Sires. Pt verbalized understanding and denied any questions. ------ Notes Recorded by Nyoka Cowden, MD on 02/01/2012 at 9:12 PM Call pt: Reviewed cxr and no acute change so no change in recommendations made at ov ---  Pt does not remember talking with Verlon Au. I spoke with patient about results and she verbalized understanding and had no questions

## 2012-02-22 DIAGNOSIS — R339 Retention of urine, unspecified: Secondary | ICD-10-CM | POA: Diagnosis not present

## 2012-03-01 DIAGNOSIS — E119 Type 2 diabetes mellitus without complications: Secondary | ICD-10-CM | POA: Diagnosis not present

## 2012-03-05 ENCOUNTER — Encounter: Payer: Self-pay | Admitting: Podiatry

## 2012-03-05 DIAGNOSIS — I739 Peripheral vascular disease, unspecified: Secondary | ICD-10-CM | POA: Insufficient documentation

## 2012-03-05 DIAGNOSIS — B351 Tinea unguium: Secondary | ICD-10-CM

## 2012-03-05 DIAGNOSIS — M2040 Other hammer toe(s) (acquired), unspecified foot: Secondary | ICD-10-CM | POA: Insufficient documentation

## 2012-03-05 HISTORY — DX: Tinea unguium: B35.1

## 2012-03-22 NOTE — Assessment & Plan Note (Signed)
No show at 6 weeks

## 2012-03-22 NOTE — Progress Notes (Signed)
  Subjective:    Patient ID: Allison [name], female    DOB: Nov 12, 1934  MRN: [id number]  HPI  78 yobf never smoked no significant resp problems although exposed to second smoke and turned positive skin test and [facility] referred by urgent care for eval of abn cxr taken p spell of sob.   12/24/2011 1st pulmonary eval cc abupt onset Dec 13 2011 of choking p supper despite omeprazole 20 mg with breakfast so increased to 2 daily and some better since. No sob x during choking spell,   02/01/12 no show        Objective:   Physical Exam Pleasant amb bf nad Wt Readings from Last 3 Encounters:  12/24/11 159 lb 12.8 oz (72.485 kg)  11/11/10 166 lb (75.297 kg)    HEENT: nl dentition, turbinates, and orophanx. Nl external ear canals without cough reflex   NECK :  without JVD/Nodes/TM/ nl carotid upstrokes bilaterally   LUNGS: no acc muscle use, clear to A and P bilaterally without cough on insp or exp maneuvers   CV:  RRR  no s3 or murmur or increase in P2, no edema   ABD:  soft and nontender with nl excursion in the supine position. No bruits or organomegaly, bowel sounds nl  MS:  warm without deformities, calf tenderness, cyanosis or clubbing  SKIN: warm and dry without lesions    NEURO:  alert, approp, no deficits    12/13/11 cxr report/ reviewed scan Possible small nodules at lateral left lung base     Assessment & Plan:

## 2012-03-23 ENCOUNTER — Ambulatory Visit: Payer: Medicare Other | Admitting: Internal Medicine

## 2012-03-29 ENCOUNTER — Ambulatory Visit: Payer: Self-pay | Admitting: Podiatry

## 2012-04-06 ENCOUNTER — Encounter: Payer: Self-pay | Admitting: Podiatry

## 2012-04-06 ENCOUNTER — Ambulatory Visit (INDEPENDENT_AMBULATORY_CARE_PROVIDER_SITE_OTHER): Payer: Medicare Other | Admitting: Podiatry

## 2012-04-06 VITALS — Ht 64.0 in | Wt 162.0 lb

## 2012-04-06 DIAGNOSIS — M25579 Pain in unspecified ankle and joints of unspecified foot: Secondary | ICD-10-CM | POA: Diagnosis not present

## 2012-04-06 DIAGNOSIS — L600 Ingrowing nail: Secondary | ICD-10-CM | POA: Diagnosis not present

## 2012-04-06 DIAGNOSIS — M25571 Pain in right ankle and joints of right foot: Secondary | ICD-10-CM

## 2012-04-06 NOTE — Progress Notes (Signed)
Subjective: 78 y.o. year old female patient presents complaining of painful nails. Stated that 4th digit on right foot has been hurting.   Patient Summary List & History reviewed for allergies, medications, medical problems and surgical history.  Review of Systems - General ROS: negative for - chills, fatigue, fever, hot flashes, malaise, night sweats, sleep disturbance, weight gain or weight loss  Objective: Dermatologic:  Skin: Mild dry and scaly skin on both heels. No open lesions. Thick yellow deformed nails on the 1st and 2nd digits bilateral. Deformed, ingrown, and thickened nail on right 4th at lateral 1/2, symptomatic without associated edema or erythema.  Vascular: Warm, good capillary refill and normal DP and PT pulses bilateral.  Orthopedic:  normal exam, no swelling, tenderness, instability; ligaments intact, full range of motion of all ankle/foot joints  Neurologic:  Gait normal. Reflexes normal and symmetric. Sensations vibratory, monofilament testings normal bilaterally.  Assessment: Dystrophic mycotic nails 1st and 2nd bilateral. Ingrown symptomatic nail 4th right.  Treatment: All mycotic nails debrided.  4th digit lateral 1/2 debrided.   Reviewed the benefit of partial nail resection to prevent ingrown nail on 4th right. Return in 3 months or sooner as needed.

## 2012-04-06 NOTE — Patient Instructions (Addendum)
Today trimmed thick toe nails. 4th digit right foot has deformed nail on corner which is pressing against bone and gets painful. It can be trimmed in regular base and alleviate pain and also can be cut out to resolve the problem for good. Return in 3 months for diabetic foot care or sooner as needed.

## 2012-04-07 ENCOUNTER — Ambulatory Visit (INDEPENDENT_AMBULATORY_CARE_PROVIDER_SITE_OTHER): Payer: Medicare Other | Admitting: Internal Medicine

## 2012-04-07 ENCOUNTER — Ambulatory Visit (INDEPENDENT_AMBULATORY_CARE_PROVIDER_SITE_OTHER)
Admission: RE | Admit: 2012-04-07 | Discharge: 2012-04-07 | Disposition: A | Discharge status: Home / Self Care | Payer: Medicare Other | Source: Ambulatory Visit | Attending: Internal Medicine | Admitting: Internal Medicine

## 2012-04-07 ENCOUNTER — Encounter: Payer: Self-pay | Admitting: Internal Medicine

## 2012-04-07 VITALS — BP 150/82 | HR 89 | Temp 97.1°F | Ht 64.0 in | Wt 164.0 lb

## 2012-04-07 DIAGNOSIS — R9389 Abnormal findings on diagnostic imaging of other specified body structures: Secondary | ICD-10-CM

## 2012-04-07 DIAGNOSIS — R918 Other nonspecific abnormal finding of lung field: Secondary | ICD-10-CM

## 2012-04-07 DIAGNOSIS — I10 Essential (primary) hypertension: Secondary | ICD-10-CM | POA: Diagnosis not present

## 2012-04-07 NOTE — Patient Instructions (Addendum)
No further pulmonary follow up needed

## 2012-04-07 NOTE — Progress Notes (Signed)
  Subjective:    Patient ID: Allison [name], female    DOB: 05-Apr-1934  MRN: [id number]  HPI  75 yobf never smoked no significant resp problems although exposed to second smoke and turned positive skin test and [facility] referred by urgent care for eval of abn cxr taken p spell of sob.   12/24/2011 1st pulmonary eval cc abupt onset Dec 13 2011 of choking p supper despite omeprazole 20 mg with breakfast so increased to 2 daily and some better since. No sob x during choking spell,  Change prilosec to 20 mg and take 2 about 30 min to an hour  Before bfast and if you continue to have any problems with swallowing or clearing your throat take one before supper too GERD diet. Please schedule a follow up visit in 3 months but call sooner if needed with cxr Late add:  cxr reviewed, needs f/u in 6 weeks.  02/01/12 no show   04/07/2012 f/u ov/[facility] Seever cc breathing all better, no cough or choking   No obvious daytime variabilty or assoc sob or cp or chest tightness, subjective wheeze overt sinus or hb symptoms. No unusual exp hx or h/o childhood pna/ asthma or premature birth to her knowledge.   Sleeping ok without nocturnal  or early am exacerbation  of respiratory  c/o's or need for noct saba. Also denies any obvious fluctuation of symptoms with weather or environmental changes or other aggravating or alleviating factors except as outlined above   ROS  The following are not active complaints unless bolded sore throat, dysphagia, dental problems, itching, sneezing,  nasal congestion or excess/ purulent secretions, ear ache,   fever, chills, sweats, unintended wt loss, pleuritic or exertional cp, hemoptysis,  orthopnea pnd or leg swelling, presyncope, palpitations, heartburn, abdominal pain, anorexia, nausea, vomiting, diarrhea  or change in bowel or urinary habits, change in stools or urine, dysuria,hematuria,  rash, arthralgias, visual complaints, headache, numbness weakness or  ataxia or problems with walking or coordination,  change in mood/affect or memory.           Objective:   Physical Exam  Pleasant amb bf nad  04/07/2012 164  Wt Readings from Last 3 Encounters:  12/24/11 159 lb 12.8 oz (72.485 kg)  11/11/10 166 lb (75.297 kg)    HEENT: nl dentition, turbinates, and orophanx. Nl external ear canals without cough reflex   NECK :  without JVD/Nodes/TM/ nl carotid upstrokes bilaterally   LUNGS: no acc muscle use, clear to A and P bilaterally without cough on insp or exp maneuvers   CV:  RRR  no s3 or murmur or increase in P2, no edema   ABD:  soft and nontender with nl excursion in the supine position. No bruits or organomegaly, bowel sounds nl  MS:  warm without deformities, calf tenderness, cyanosis or clubbing  SKIN: warm and dry without lesions         12/13/11 cxr report/ reviewed scan Possible small nodules at lateral left lung base  CXR  04/07/2012 :  No acute abnormalities seen      Assessment & Plan:

## 2012-04-08 NOTE — Progress Notes (Signed)
Quick Note:  Spoke with pt and notified of results per Dr. Wert. Pt verbalized understanding and denied any questions.  ______ 

## 2012-04-08 NOTE — Assessment & Plan Note (Addendum)
cxr's reviewed with pt, no evidence of significant nodules or reactivation TB and all symptoms resolved so no further pulmonary f/u planned

## 2012-04-12 DIAGNOSIS — E119 Type 2 diabetes mellitus without complications: Secondary | ICD-10-CM | POA: Diagnosis not present

## 2012-04-14 DIAGNOSIS — J309 Allergic rhinitis, unspecified: Secondary | ICD-10-CM | POA: Diagnosis not present

## 2012-04-14 DIAGNOSIS — E785 Hyperlipidemia, unspecified: Secondary | ICD-10-CM | POA: Diagnosis not present

## 2012-04-14 DIAGNOSIS — IMO0001 Reserved for inherently not codable concepts without codable children: Secondary | ICD-10-CM | POA: Diagnosis not present

## 2012-04-14 DIAGNOSIS — E039 Hypothyroidism, unspecified: Secondary | ICD-10-CM | POA: Diagnosis not present

## 2012-04-14 DIAGNOSIS — E1142 Type 2 diabetes mellitus with diabetic polyneuropathy: Secondary | ICD-10-CM | POA: Diagnosis not present

## 2012-04-14 DIAGNOSIS — K219 Gastro-esophageal reflux disease without esophagitis: Secondary | ICD-10-CM | POA: Diagnosis not present

## 2012-04-14 DIAGNOSIS — I10 Essential (primary) hypertension: Secondary | ICD-10-CM | POA: Diagnosis not present

## 2012-05-18 DIAGNOSIS — E119 Type 2 diabetes mellitus without complications: Secondary | ICD-10-CM | POA: Diagnosis not present

## 2012-06-23 DIAGNOSIS — E1142 Type 2 diabetes mellitus with diabetic polyneuropathy: Secondary | ICD-10-CM | POA: Diagnosis not present

## 2012-06-23 DIAGNOSIS — E039 Hypothyroidism, unspecified: Secondary | ICD-10-CM | POA: Diagnosis not present

## 2012-06-23 DIAGNOSIS — E785 Hyperlipidemia, unspecified: Secondary | ICD-10-CM | POA: Diagnosis not present

## 2012-06-23 DIAGNOSIS — K219 Gastro-esophageal reflux disease without esophagitis: Secondary | ICD-10-CM | POA: Diagnosis not present

## 2012-06-23 DIAGNOSIS — I10 Essential (primary) hypertension: Secondary | ICD-10-CM | POA: Diagnosis not present

## 2012-06-23 DIAGNOSIS — IMO0001 Reserved for inherently not codable concepts without codable children: Secondary | ICD-10-CM | POA: Diagnosis not present

## 2012-06-23 DIAGNOSIS — J309 Allergic rhinitis, unspecified: Secondary | ICD-10-CM | POA: Diagnosis not present

## 2012-06-23 DIAGNOSIS — R21 Rash and other nonspecific skin eruption: Secondary | ICD-10-CM | POA: Diagnosis not present

## 2012-06-25 ENCOUNTER — Emergency Department (INDEPENDENT_AMBULATORY_CARE_PROVIDER_SITE_OTHER)
Admission: EM | Admit: 2012-06-25 | Discharge: 2012-06-25 | Disposition: A | Discharge status: Home / Self Care | Payer: Medicare Other | Source: Home / Self Care | Attending: Emergency Medicine | Admitting: Emergency Medicine

## 2012-06-25 ENCOUNTER — Encounter (HOSPITAL_COMMUNITY): Payer: Self-pay | Admitting: Emergency Medicine

## 2012-06-25 DIAGNOSIS — B86 Scabies: Secondary | ICD-10-CM

## 2012-06-25 MED ORDER — PERMETHRIN 5 % EX CREA: TOPICAL_CREAM | CUTANEOUS | Status: DC

## 2012-06-25 MED ORDER — HYDROCORTISONE 2.5 % EX LOTN: TOPICAL_LOTION | Freq: Two times a day (BID) | CUTANEOUS | Status: DC

## 2012-06-25 NOTE — ED Provider Notes (Signed)
Medical screening examination/treatment/procedure(s) were performed by non-physician practitioner and as supervising physician I was immediately available for consultation/collaboration.  Ahley Bulls, M.D.  Saagar Tortorella C Tian Mcmurtrey, MD 06/25/12 1852 

## 2012-06-25 NOTE — ED Notes (Signed)
Waiting discharge papers 

## 2012-06-25 NOTE — ED Provider Notes (Signed)
History     CSN: [id number]  Arrival date & time 06/25/12  1112   None     Chief Complaint  Patient presents with  . Rash    rash on arms and legs since thursday.     (Consider location/radiation/quality/duration/timing/severity/associated sxs/prior treatment) Patient is a 78 y.o. female presenting with rash. The history is provided by the patient. No language interpreter was used.  Rash Pain location:  Generalized Pain radiates to:  Does not radiate Pain severity:  Moderate Onset quality:  Gradual Timing:  Constant Progression:  Worsening Chronicity:  New Relieved by:  Nothing Worsened by:  Nothing tried Ineffective treatments:  None tried Pt reports rash after staying with a friend who had a death in the family.  Pt reports rash on hands trunk and legs  Past Medical History  Diagnosis Date  . SOB (shortness of breath)   . Cough   . Dysphagia     Past Surgical History  Procedure Laterality Date  . Vesicovaginal fistula closure w/ tah  1976    Family History  Problem Relation Age of Onset  . Asthma Sister   . Sleep apnea Sister   . Heart disease Sister   . Heart disease Father     History  Substance Use Topics  . Smoking status: Never Smoker   . Smokeless tobacco: Never Used  . Alcohol Use: No    OB History   Grav Para Term Preterm Abortions TAB SAB Ect Mult Living                  Review of Systems  Skin: Positive for rash.  All other systems reviewed and are negative.    Allergies  Codeine; Penicillins; and Rofecoxib  Home Medications   Current Outpatient Rx  Name  Route  Sig  Dispense  Refill  . amLODipine-olmesartan (AZOR) 10-40 MG per tablet   Oral   Take 1 tablet by mouth daily.         Marland Kitchen aspirin 81 MG tablet   Oral   Take 81 mg by mouth daily.           . chlorthalidone (HYGROTON) 25 MG tablet   Oral   Take 25 mg by mouth daily.         Marland Kitchen glyBURIDE-metformin (GLUCOVANCE) 5-500 MG per tablet   Oral   Take 2 tablets  by mouth 2 (two) times daily with a meal.           . levothyroxine (SYNTHROID, LEVOTHROID) 150 MCG tablet   Oral   Take 150 mcg by mouth daily.         Marland Kitchen omeprazole (PRILOSEC) 20 MG capsule   Oral   Take 20 mg by mouth daily.          Bertram Gala Glycol-Propyl Glycol (SYSTANE) 0.4-0.3 % SOLN      As needed         . rosuvastatin (CRESTOR) 40 MG tablet   Oral   Take 40 mg by mouth daily.           . vitamin E 1000 UNIT capsule   Oral   Take 1,000 Units by mouth 3 (three) times a week.            BP 137/78  Pulse 108  Temp(Src) 98.8 F (37.1 C) (Oral)  Resp 18  SpO2 97%  Physical Exam  Nursing note and vitals reviewed. Constitutional: She is oriented to person, place, and time. She  appears well-developed and well-nourished.  HENT:  Head: Normocephalic.  Eyes: Pupils are equal, round, and reactive to light.  Neck: Normal range of motion.  Cardiovascular: Normal rate.   Pulmonary/Chest: Effort normal.  Musculoskeletal: Normal range of motion.  Neurological: She is alert and oriented to person, place, and time. She has normal reflexes.  Skin: Rash noted.  Possible burrows fingers and hands,   Large bites trunk and legs,   (insect, possibly bed bugs)  Psychiatric: She has a normal mood and affect.    ED Course  Procedures (including critical care time)  Labs Reviewed - No data to display No results found.   1. Scabies       MDM  Pt advised chesk for bedbugs.   I will cover for scabies     Pt given rx for elemite.   I will treat bug bites with hydrocortisone    Elson Areas, PA-C 06/25/12 1154  Lonia Skinner [place], [place] 06/25/12 1156  Lonia Skinner [place], PA-C 06/25/12 1336  Lonia Skinner [place], [place] 06/25/12 1357

## 2012-06-25 NOTE — ED Notes (Signed)
Pt c/o rash on arms and legs since Thursday.  Pt states that there was a recent change in detergents but everything else still the same. Denies any recent hotel stays.  Pt has been using a anti itch cream with no relief in symptoms.

## 2012-07-02 ENCOUNTER — Emergency Department (HOSPITAL_COMMUNITY)
Admission: EM | Admit: 2012-07-02 | Discharge: 2012-07-02 | Disposition: A | Discharge status: Home / Self Care | Payer: Medicare Other | Attending: Emergency Medicine | Admitting: Emergency Medicine

## 2012-07-02 ENCOUNTER — Encounter (HOSPITAL_COMMUNITY): Payer: Self-pay | Admitting: Emergency Medicine

## 2012-07-02 DIAGNOSIS — Z880 Allergy status to penicillin: Secondary | ICD-10-CM | POA: Diagnosis not present

## 2012-07-02 DIAGNOSIS — Z79899 Other long term (current) drug therapy: Secondary | ICD-10-CM | POA: Insufficient documentation

## 2012-07-02 DIAGNOSIS — Z8709 Personal history of other diseases of the respiratory system: Secondary | ICD-10-CM | POA: Diagnosis not present

## 2012-07-02 DIAGNOSIS — Z8719 Personal history of other diseases of the digestive system: Secondary | ICD-10-CM | POA: Diagnosis not present

## 2012-07-02 DIAGNOSIS — B86 Scabies: Secondary | ICD-10-CM

## 2012-07-02 DIAGNOSIS — Z7982 Long term (current) use of aspirin: Secondary | ICD-10-CM | POA: Insufficient documentation

## 2012-07-02 MED ORDER — PERMETHRIN 5 % EX CREA: TOPICAL_CREAM | CUTANEOUS | Status: DC

## 2012-07-02 NOTE — ED Provider Notes (Signed)
History    This chart was scribed for Glade Nurse, non-physician practitioner working with Raeford Razor, MD by Leone Payor, ED Scribe. This patient was seen in room WTR7/WTR7 and the patient's care was started at 1653.   CSN: [id number]  Arrival date & time 07/02/12  1653   First MD Initiated Contact with Patient 07/02/12 1719      Chief Complaint  Patient presents with  . Rash     Patient is a 78 y.o. female presenting with rash. The history is provided by the patient. No language interpreter was used.  Rash Associated symptoms: no chest pain, no constipation, no diarrhea, no dysuria, no fever, no nausea, no shortness of breath and no vomiting     HPI Comments: Allison [name] is a 78 y.o. female no pertinent past medical history who presents to the Emergency Department complaining of a constant, gradually worsening rash to the back of neck that started about 1 week ago. She came to ED and was prescribed permethrin. Pt states she did not follow up as instructed. States the rash is now moderately itchy and has progressed to between her fingers and toes, back of legs and arms. Pt states no pain, no fever, no nausea, no vomiting.    Past Medical History  Diagnosis Date  . SOB (shortness of breath)   . Cough   . Dysphagia     Past Surgical History  Procedure Laterality Date  . Vesicovaginal fistula closure w/ tah  1976    Family History  Problem Relation Age of Onset  . Asthma Sister   . Sleep apnea Sister   . Heart disease Sister   . Heart disease Father     History  Substance Use Topics  . Smoking status: Never Smoker   . Smokeless tobacco: Never Used  . Alcohol Use: No    OB History   Grav Para Term Preterm Abortions TAB SAB Ect Mult Living                  Review of Systems  Constitutional: Negative for fever and diaphoresis.  HENT: Negative for neck pain and neck stiffness.   Eyes: Negative for visual disturbance.  Respiratory: Negative for apnea,  chest tightness and shortness of breath.   Cardiovascular: Negative for chest pain and palpitations.  Gastrointestinal: Negative for nausea, vomiting, diarrhea and constipation.  Genitourinary: Negative for dysuria.  Musculoskeletal: Negative for gait problem.  Skin: Positive for rash.  Neurological: Negative for dizziness, weakness, light-headedness, numbness and headaches.    Allergies  Codeine; Penicillins; and Rofecoxib  Home Medications   Current Outpatient Rx  Name  Route  Sig  Dispense  Refill  . amLODipine-olmesartan (AZOR) 10-40 MG per tablet   Oral   Take 1 tablet by mouth daily.         Marland Kitchen aspirin 81 MG tablet   Oral   Take 81 mg by mouth daily.           . chlorthalidone (HYGROTON) 25 MG tablet   Oral   Take 25 mg by mouth daily.         Marland Kitchen glyBURIDE-metformin (GLUCOVANCE) 5-500 MG per tablet   Oral   Take 2 tablets by mouth 2 (two) times daily with a meal.           . hydrocortisone 2.5 % lotion   Topical   Apply topically 2 (two) times daily.   59 mL   0   . levothyroxine (SYNTHROID,  LEVOTHROID) 150 MCG tablet   Oral   Take 150 mcg by mouth daily.         Marland Kitchen omeprazole (PRILOSEC) 20 MG capsule   Oral   Take 20 mg by mouth daily.          . permethrin (ELIMITE) 5 % cream      Apply to affected area once   60 g   0   . Polyethyl Glycol-Propyl Glycol (SYSTANE) 0.4-0.3 % SOLN      As needed         . rosuvastatin (CRESTOR) 40 MG tablet   Oral   Take 40 mg by mouth daily.           . vitamin E 1000 UNIT capsule   Oral   Take 1,000 Units by mouth 3 (three) times a week.            There were no vitals taken for this visit.  Physical Exam  Nursing note and vitals reviewed. Constitutional: She is oriented to person, place, and time. She appears well-developed and well-nourished. No distress.  HENT:  Head: Normocephalic and atraumatic.  Eyes: Conjunctivae and EOM are normal.  Neck: Normal range of motion. Neck supple.  No  meningeal signs  Cardiovascular: Normal rate, regular rhythm and normal heart sounds.  Exam reveals no gallop and no friction rub.   No murmur heard. Pulmonary/Chest: Effort normal and breath sounds normal. No respiratory distress. She has no wheezes. She has no rales. She exhibits no tenderness.  Abdominal: Soft. Bowel sounds are normal. She exhibits no distension. There is no tenderness. There is no rebound and no guarding.  Musculoskeletal: Normal range of motion. She exhibits no edema and no tenderness.  Neurological: She is alert and oriented to person, place, and time. No cranial nerve deficit.  Skin: Skin is warm and dry. Rash noted. She is not diaphoretic.  Multiple marks bilateral arms. Multiple marks to lower L leg, between webbing of fingers.     ED Course  Procedures (including critical care time)  DIAGNOSTIC STUDIES: Oxygen Saturation is 97% on RA, adequate by my interpretation.    COORDINATION OF CARE: 5:48 PM Discussed treatment plan with pt at bedside and pt agreed to plan.   Labs Reviewed - No data to display No results found.   1. Scabies       MDM  Pt was seen one week prior for same. Pt did not follow up as discussed at previous visit. Discussed diagnosis & treatment of scabies and reiterated importance of follow up with primary care or dermatologist. Will prescribed premethrin for another application. Directed to use head to toe and to leave on for 8-12 hours before washing. Advised to clean entire household including washing sheets and using R.I.D. spray in the car and on sofa.   Discussed reasons to seek immediate care. Patient expresses understanding and agrees with plan.  I personally performed the services described in this documentation, which was scribed in my presence. The recorded information has been reviewed and is accurate.    Glade Nurse, PA-C 07/02/12 2231

## 2012-07-02 NOTE — ED Notes (Signed)
Pt states she has a rash that started behind her neck. Now rash is itchy and is between her fingers and on the back of her legs. Pt states symptoms started about a week ago. Pt a/o x 4. Skin warm, dry.

## 2012-07-08 ENCOUNTER — Ambulatory Visit (INDEPENDENT_AMBULATORY_CARE_PROVIDER_SITE_OTHER): Payer: Medicare Other | Admitting: Podiatry

## 2012-07-08 DIAGNOSIS — B351 Tinea unguium: Secondary | ICD-10-CM | POA: Diagnosis not present

## 2012-07-08 DIAGNOSIS — M25571 Pain in right ankle and joints of right foot: Secondary | ICD-10-CM

## 2012-07-08 DIAGNOSIS — M25579 Pain in unspecified ankle and joints of unspecified foot: Secondary | ICD-10-CM | POA: Diagnosis not present

## 2012-07-08 NOTE — Progress Notes (Signed)
Subjective: 78 year old female presents requesting toe nails cut. Right foot 5th toe curls under the 4th and nail is pinching the skin. Her blood sugar is under control. Denies having any other problems.  Objective: All pedal pulses are palpable. Thick yellow nails x 10. Pain on 4th and 5th toe right.  Assessment: Onychomycosis x 10. Painful toes. NIDDM under control.  Plan: Debrided all nails. Return as needed.

## 2012-07-09 NOTE — ED Provider Notes (Signed)
Medical screening examination/treatment/procedure(s) were performed by non-physician practitioner and as supervising physician I was immediately available for consultation/collaboration.  Raeford Razor, MD 07/09/12 [phone]

## 2012-07-13 DIAGNOSIS — R339 Retention of urine, unspecified: Secondary | ICD-10-CM | POA: Diagnosis not present

## 2012-07-13 DIAGNOSIS — R351 Nocturia: Secondary | ICD-10-CM | POA: Diagnosis not present

## 2012-09-16 ENCOUNTER — Ambulatory Visit: Payer: Medicare Other | Admitting: Podiatry

## 2012-09-17 ENCOUNTER — Encounter (HOSPITAL_COMMUNITY): Payer: Self-pay | Admitting: Emergency Medicine

## 2012-09-17 ENCOUNTER — Emergency Department (HOSPITAL_COMMUNITY)
Admission: EM | Admit: 2012-09-17 | Discharge: 2012-09-17 | Disposition: A | Discharge status: Home / Self Care | Payer: Medicare Other | Attending: Emergency Medicine | Admitting: Emergency Medicine

## 2012-09-17 ENCOUNTER — Emergency Department (HOSPITAL_COMMUNITY): Payer: Medicare Other

## 2012-09-17 DIAGNOSIS — M25579 Pain in unspecified ankle and joints of unspecified foot: Secondary | ICD-10-CM | POA: Insufficient documentation

## 2012-09-17 DIAGNOSIS — Z79899 Other long term (current) drug therapy: Secondary | ICD-10-CM | POA: Diagnosis not present

## 2012-09-17 DIAGNOSIS — Z7982 Long term (current) use of aspirin: Secondary | ICD-10-CM | POA: Diagnosis not present

## 2012-09-17 DIAGNOSIS — M7989 Other specified soft tissue disorders: Secondary | ICD-10-CM | POA: Diagnosis not present

## 2012-09-17 DIAGNOSIS — Z880 Allergy status to penicillin: Secondary | ICD-10-CM | POA: Insufficient documentation

## 2012-09-17 DIAGNOSIS — M25572 Pain in left ankle and joints of left foot: Secondary | ICD-10-CM

## 2012-09-17 MED ORDER — HYDROCODONE-ACETAMINOPHEN 5-325 MG PO TABS: 1.0000 | ORAL_TABLET | ORAL | Status: DC | PRN

## 2012-09-17 MED ORDER — IBUPROFEN 600 MG PO TABS: 600.0000 mg | ORAL_TABLET | Freq: Three times a day (TID) | ORAL | Status: DC | PRN

## 2012-09-17 MED ORDER — IBUPROFEN 200 MG PO TABS
600.0000 mg | ORAL_TABLET | Freq: Once | ORAL | Status: AC
  Administered 2012-09-17: 600 mg via ORAL
  Filled 2012-09-17: qty 3

## 2012-09-17 NOTE — ED Provider Notes (Signed)
CSN: [id number]     Arrival date & time 09/17/12  1411 History   First MD Initiated Contact with Patient 09/17/12 1508     Chief Complaint  Patient presents with  . Ankle Pain   (Consider location/radiation/quality/duration/timing/severity/associated sxs/prior Treatment) Patient is a 78 y.o. female presenting with ankle pain. The history is provided by the patient.  Ankle Pain Location:  Ankle Ankle location:  L ankle Pain details:    Quality:  Aching   Severity:  Moderate   Onset quality:  Gradual   Duration:  2 days Associated symptoms: no fever   Associated symptoms comment:  Left ankle pain and swelling without known injury. No calf pain, redness or fever. No history of gout in the past. She reports having to use a cane because her balance is unsteady because of the pain in the ankle. No falls.   Past Medical History  Diagnosis Date  . SOB (shortness of breath)   . Cough   . Dysphagia    Past Surgical History  Procedure Laterality Date  . Vesicovaginal fistula closure w/ tah  1976   Family History  Problem Relation Age of Onset  . Asthma Sister   . Sleep apnea Sister   . Heart disease Sister   . Heart disease Father    History  Substance Use Topics  . Smoking status: Never Smoker   . Smokeless tobacco: Never Used  . Alcohol Use: No   OB History   Grav Para Term Preterm Abortions TAB SAB Ect Mult Living                 Review of Systems  Constitutional: Negative for fever and chills.  Respiratory: Negative.  Negative for shortness of breath.   Cardiovascular: Negative.  Negative for chest pain and leg swelling.  Genitourinary: Negative.  Negative for dysuria.  Musculoskeletal:       See HPI.  Skin: Negative.  Negative for color change, rash and wound.  Neurological: Negative.  Negative for weakness and numbness.    Allergies  Pineapple; Codeine; Penicillins; and Rofecoxib  Home Medications   Current Outpatient Rx  Name  Route  Sig  Dispense  Refill   . amLODipine-olmesartan (AZOR) 10-40 MG per tablet   Oral   Take 1 tablet by mouth daily.         Marland Kitchen aspirin 81 MG tablet   Oral   Take 81 mg by mouth every morning.          . glyBURIDE-metformin (GLUCOVANCE) 5-500 MG per tablet   Oral   Take 2 tablets by mouth 2 (two) times daily with a meal.           . levothyroxine (SYNTHROID, LEVOTHROID) 150 MCG tablet   Oral   Take 150 mcg by mouth every morning.          Marland Kitchen omeprazole (PRILOSEC) 20 MG capsule   Oral   Take 20 mg by mouth daily.          . rosuvastatin (CRESTOR) 40 MG tablet   Oral   Take 40 mg by mouth every evening.          . vitamin E 1000 UNIT capsule   Oral   Take 1,000 Units by mouth 3 (three) times a week.          BP 161/73  Pulse 81  Temp(Src) 98 F (36.7 C) (Oral)  Resp 18  SpO2 99% Physical Exam  ED Course  Procedures (  including critical care time) Labs Review Labs Reviewed - No data to display Imaging Review Dg Ankle Complete Left  09/17/2012   *RADIOLOGY REPORT*  Clinical Data: Left ankle pain.  LEFT ANKLE COMPLETE - 3+ VIEW  Comparison: No priors.  Findings: Marked soft tissue swelling overlying the lateral malleolus.  No acute displaced fracture, subluxation or dislocation is noted.  IMPRESSION: 1.  Soft tissue swelling overlying the lateral malleolus without evidence of underlying acute bony trauma.   Original Report Authenticated By: Trudie Reed, M.D.    MDM  No diagnosis found. 1. Arthritis  Negative x-ray for fracture. Suspect isolated arthritis, possible gout given history of diabetes. NO calf tenderness, doubt DVT or vascular source. No redness or warmth, doubt infectious process. Dr. Patria Mane made aware and has seen/evaluated patient.    Arnoldo Hooker, PA-C 09/17/12 1538

## 2012-09-17 NOTE — ED Notes (Signed)
Pt escorted to discharge window. Verbalized understanding discharge instructions. In no acute distress.   

## 2012-09-17 NOTE — ED Provider Notes (Signed)
Medical screening examination/treatment/procedure(s) were conducted as a shared visit with non-physician practitioner(s) and myself.  I personally evaluated the patient during the encounter  Patient with some left ankle pain.  Doubt septic arthritis.  This is likely left ankle sprain/strain.  Could represent gout.  Discharge home in good condition.  Orthopedic followup.  Overall well appearing.  Ambulatory.  Lyanne Co, MD 09/17/12 [phone]

## 2012-09-17 NOTE — ED Notes (Signed)
Pt c/o lt ankle pain x 2 days.  States that she did not do anything to hurt it.  But states that she feels unsteady due to the pain and is afraid of falling.

## 2012-09-20 DIAGNOSIS — I10 Essential (primary) hypertension: Secondary | ICD-10-CM | POA: Diagnosis not present

## 2012-09-20 DIAGNOSIS — K219 Gastro-esophageal reflux disease without esophagitis: Secondary | ICD-10-CM | POA: Diagnosis not present

## 2012-09-20 DIAGNOSIS — E1142 Type 2 diabetes mellitus with diabetic polyneuropathy: Secondary | ICD-10-CM | POA: Diagnosis not present

## 2012-09-20 DIAGNOSIS — Z23 Encounter for immunization: Secondary | ICD-10-CM | POA: Diagnosis not present

## 2012-09-20 DIAGNOSIS — E039 Hypothyroidism, unspecified: Secondary | ICD-10-CM | POA: Diagnosis not present

## 2012-09-20 DIAGNOSIS — IMO0001 Reserved for inherently not codable concepts without codable children: Secondary | ICD-10-CM | POA: Diagnosis not present

## 2012-09-20 DIAGNOSIS — E785 Hyperlipidemia, unspecified: Secondary | ICD-10-CM | POA: Diagnosis not present

## 2012-09-20 DIAGNOSIS — J309 Allergic rhinitis, unspecified: Secondary | ICD-10-CM | POA: Diagnosis not present

## 2012-09-22 DIAGNOSIS — J309 Allergic rhinitis, unspecified: Secondary | ICD-10-CM | POA: Diagnosis not present

## 2012-10-03 ENCOUNTER — Other Ambulatory Visit: Payer: Self-pay

## 2012-10-03 DIAGNOSIS — Z1231 Encounter for screening mammogram for malignant neoplasm of breast: Secondary | ICD-10-CM

## 2012-10-11 ENCOUNTER — Ambulatory Visit (INDEPENDENT_AMBULATORY_CARE_PROVIDER_SITE_OTHER): Payer: Medicare Other | Admitting: Podiatry

## 2012-10-11 ENCOUNTER — Encounter: Payer: Self-pay | Admitting: Podiatry

## 2012-10-11 VITALS — BP 124/68 | HR 90 | Ht 64.0 in | Wt 157.0 lb

## 2012-10-11 DIAGNOSIS — B351 Tinea unguium: Secondary | ICD-10-CM

## 2012-10-11 DIAGNOSIS — M25579 Pain in unspecified ankle and joints of unspecified foot: Secondary | ICD-10-CM

## 2012-10-11 NOTE — Progress Notes (Signed)
Subjective:  78 year old female presents requesting toe nails cut. Her blood sugar is under control.  Stated that her left foot itches along the arch area.  Objective: All pedal pulses are palpable.  Thick yellow nails x 10.  No visible skin lesions or bumps, or discolored lesions noted.  Assessment: Onychomycosis x 10.  NIDDM under control.  Possible Tinea pedis left foot.  Plan: Debrided all nails.  May use Vinegar soak for itch skin and continue with vic's vapor rub as needed. Return as needed.

## 2012-10-11 NOTE — Patient Instructions (Addendum)
Seen for hypertrophic nails. Debrided all nails. Soak in Vinegar water for itch skin and continue to use Vic's vapor rub.  Return as needed.

## 2012-10-18 ENCOUNTER — Encounter (HOSPITAL_COMMUNITY): Payer: Self-pay | Admitting: Emergency Medicine

## 2012-10-18 ENCOUNTER — Emergency Department (HOSPITAL_COMMUNITY)
Admission: EM | Admit: 2012-10-18 | Discharge: 2012-10-18 | Disposition: A | Discharge status: Home / Self Care | Payer: Medicare Other | Attending: Emergency Medicine | Admitting: Emergency Medicine

## 2012-10-18 DIAGNOSIS — J029 Acute pharyngitis, unspecified: Secondary | ICD-10-CM

## 2012-10-18 DIAGNOSIS — Z7982 Long term (current) use of aspirin: Secondary | ICD-10-CM | POA: Insufficient documentation

## 2012-10-18 DIAGNOSIS — Z880 Allergy status to penicillin: Secondary | ICD-10-CM | POA: Diagnosis not present

## 2012-10-18 DIAGNOSIS — Z79899 Other long term (current) drug therapy: Secondary | ICD-10-CM | POA: Insufficient documentation

## 2012-10-18 DIAGNOSIS — E119 Type 2 diabetes mellitus without complications: Secondary | ICD-10-CM | POA: Diagnosis not present

## 2012-10-18 DIAGNOSIS — R059 Cough, unspecified: Secondary | ICD-10-CM | POA: Diagnosis not present

## 2012-10-18 DIAGNOSIS — R05 Cough: Secondary | ICD-10-CM | POA: Insufficient documentation

## 2012-10-18 MED ORDER — HYDROCODONE-ACETAMINOPHEN 7.5-325 MG/15ML PO SOLN
10.0000 mL | Freq: Once | ORAL | Status: AC
  Administered 2012-10-18: 10 mL via ORAL
  Filled 2012-10-18: qty 15

## 2012-10-18 MED ORDER — HYDROCODONE-ACETAMINOPHEN 7.5-325 MG/15ML PO SOLN: 10.0000 mL | Freq: Three times a day (TID) | ORAL | Status: DC | PRN

## 2012-10-18 NOTE — ED Notes (Signed)
Pt c/o of sore throat x3 days, unable to swallow, pain 10/10. Denies fever.

## 2012-10-18 NOTE — ED Provider Notes (Signed)
CSN: [id number]     Arrival date & time 10/18/12  3474 History   First MD Initiated Contact with Patient 10/18/12 1004     Chief Complaint  Patient presents with  . Sore Throat  . Cough   (Consider location/radiation/quality/duration/timing/severity/associated sxs/prior Treatment) HPI  This a 78 year old female who presents with sore throat. Patient has a history of diabetes. Patient presents with 3 days of worsening short-term. Patient reports dysphagia of solids. She endorses good by mouth intake of liquids. She has had no known strep sick contacts.  She endorses course this and a cough productive of white phlegm. She denies any shortness of breath. She denies any fevers. She further denies any symptoms including headache, chest pain, abdominal pain, nausea, vomiting, urinary symptoms, focal weakness or numbness.  Past Medical History  Diagnosis Date  . SOB (shortness of breath)   . Cough   . Dysphagia    Past Surgical History  Procedure Laterality Date  . Vesicovaginal fistula closure w/ tah  1976   Family History  Problem Relation Age of Onset  . Asthma Sister   . Sleep apnea Sister   . Heart disease Sister   . Heart disease Father    History  Substance Use Topics  . Smoking status: Never Smoker   . Smokeless tobacco: Never Used  . Alcohol Use: No   OB History   Grav Para Term Preterm Abortions TAB SAB Ect Mult Living                 Review of Systems  Constitutional: Negative for fever.  HENT: Positive for sore throat and trouble swallowing.   Respiratory: Positive for cough. Negative for chest tightness and shortness of breath.   Cardiovascular: Negative for chest pain.  Gastrointestinal: Negative for nausea, vomiting and abdominal pain.  Genitourinary: Negative for dysuria.  Skin: Negative for rash.  Neurological: Negative for headaches.  All other systems reviewed and are negative.    Allergies  Pineapple; Codeine; Penicillins; and Rofecoxib  Home  Medications   Current Outpatient Rx  Name  Route  Sig  Dispense  Refill  . amLODipine-olmesartan (AZOR) 10-40 MG per tablet   Oral   Take 1 tablet by mouth daily.         Marland Kitchen aspirin 81 MG tablet   Oral   Take 81 mg by mouth every morning.          . chlorthalidone (HYGROTON) 25 MG tablet   Oral   Take 25 mg by mouth daily.          Marland Kitchen glyBURIDE-metformin (GLUCOVANCE) 5-500 MG per tablet   Oral   Take 2 tablets by mouth 2 (two) times daily with a meal.           . ibuprofen (ADVIL,MOTRIN) 600 MG tablet   Oral   Take 1 tablet (600 mg total) by mouth every 8 (eight) hours as needed for pain.   15 tablet   0   . levothyroxine (SYNTHROID, LEVOTHROID) 150 MCG tablet   Oral   Take 150 mcg by mouth every morning.          Marland Kitchen omeprazole (PRILOSEC) 20 MG capsule   Oral   Take 20 mg by mouth daily.          . pseudoephedrine-guaifenesin (TUSSIN PE) 30-100 MG/5ML SYRP   Oral   Take 5 mLs by mouth every 4 (four) hours as needed (cough).         . rosuvastatin (  CRESTOR) 40 MG tablet   Oral   Take 40 mg by mouth every evening.          Marland Kitchen HYDROcodone-acetaminophen (HYCET) 7.5-325 mg/15 ml solution   Oral   Take 10 mLs by mouth every 8 (eight) hours as needed for pain.   120 mL   0    BP 158/87  Pulse 92  Temp(Src) 98.5 F (36.9 C) (Oral)  Resp 20  SpO2 97% Physical Exam  Nursing note and vitals reviewed. Constitutional: She is oriented to person, place, and time. She appears well-developed and well-nourished. No distress.  Mild hoarse voice noted  HENT:  Head: Normocephalic and atraumatic.  Mouth/Throat: Oropharynx is clear and moist. No oropharyngeal exudate.  Non-purulent fluid behind bilateral TMs without erythema and with normal light reflex  Eyes: Pupils are equal, round, and reactive to light.  Neck: Neck supple.  Cardiovascular: Normal rate, regular rhythm and normal heart sounds.   Pulmonary/Chest: Effort normal and breath sounds normal. No  respiratory distress. She has no wheezes.  Abdominal: Soft. There is no tenderness.  Lymphadenopathy:    She has cervical adenopathy.  Neurological: She is alert and oriented to person, place, and time.  Skin: Skin is warm and dry. No rash noted.  Psychiatric: She has a normal mood and affect.    ED Course  Procedures (including critical care time) Labs Review Labs Reviewed  RAPID STREP SCREEN  CULTURE, GROUP A STREP   Imaging Review No results found.  MDM   1. Viral pharyngitis     This a 78 year old female who presents with sore throat, cough, and dysphasia. She is nontoxic-appearing on exam and her vital signs are within normal limits. She's been afebrile but has had known strep sick contacts. Strep swab was sent. She's currently Centor 1/4 positive.  Patient was given Norco elixir for pain management. She has a history of diabetes and hesitate to give her steroids. Patient is tolerating her secretions.  Patient had improvement of pain with norco and was able to PO challenge.  WIll be discharged home.  GIven return precautions.  After history, exam, and medical workup I feel the patient has been appropriately medically screened and is safe for discharge home. Pertinent diagnoses were discussed with the patient. Patient was given return precautions.  Shon Baton, MD 10/18/12 [phone]

## 2012-10-20 LAB — CULTURE, GROUP A STREP

## 2012-10-22 ENCOUNTER — Emergency Department (HOSPITAL_COMMUNITY)
Admission: EM | Admit: 2012-10-22 | Discharge: 2012-10-22 | Disposition: A | Discharge status: Home / Self Care | Payer: Medicare Other | Attending: Emergency Medicine | Admitting: Emergency Medicine

## 2012-10-22 ENCOUNTER — Encounter (HOSPITAL_COMMUNITY): Payer: Self-pay | Admitting: Emergency Medicine

## 2012-10-22 ENCOUNTER — Emergency Department (HOSPITAL_COMMUNITY): Payer: Medicare Other

## 2012-10-22 DIAGNOSIS — I10 Essential (primary) hypertension: Secondary | ICD-10-CM | POA: Insufficient documentation

## 2012-10-22 DIAGNOSIS — Z880 Allergy status to penicillin: Secondary | ICD-10-CM | POA: Insufficient documentation

## 2012-10-22 DIAGNOSIS — E119 Type 2 diabetes mellitus without complications: Secondary | ICD-10-CM | POA: Insufficient documentation

## 2012-10-22 DIAGNOSIS — R141 Gas pain: Secondary | ICD-10-CM | POA: Diagnosis not present

## 2012-10-22 DIAGNOSIS — J029 Acute pharyngitis, unspecified: Secondary | ICD-10-CM | POA: Diagnosis not present

## 2012-10-22 DIAGNOSIS — R05 Cough: Secondary | ICD-10-CM | POA: Diagnosis not present

## 2012-10-22 DIAGNOSIS — Z79899 Other long term (current) drug therapy: Secondary | ICD-10-CM | POA: Diagnosis not present

## 2012-10-22 DIAGNOSIS — R142 Eructation: Secondary | ICD-10-CM | POA: Diagnosis not present

## 2012-10-22 DIAGNOSIS — Z8719 Personal history of other diseases of the digestive system: Secondary | ICD-10-CM | POA: Insufficient documentation

## 2012-10-22 DIAGNOSIS — J209 Acute bronchitis, unspecified: Secondary | ICD-10-CM | POA: Diagnosis not present

## 2012-10-22 DIAGNOSIS — J40 Bronchitis, not specified as acute or chronic: Secondary | ICD-10-CM

## 2012-10-22 DIAGNOSIS — B309 Viral conjunctivitis, unspecified: Secondary | ICD-10-CM

## 2012-10-22 DIAGNOSIS — Z7982 Long term (current) use of aspirin: Secondary | ICD-10-CM | POA: Diagnosis not present

## 2012-10-22 DIAGNOSIS — R059 Cough, unspecified: Secondary | ICD-10-CM | POA: Diagnosis not present

## 2012-10-22 HISTORY — DX: Diaphragmatic hernia without obstruction or gangrene: K44.9

## 2012-10-22 HISTORY — DX: Essential (primary) hypertension: I10

## 2012-10-22 HISTORY — DX: Type 2 diabetes mellitus without complications: E11.9

## 2012-10-22 MED ORDER — DEXAMETHASONE 4 MG PO TABS
4.0000 mg | ORAL_TABLET | Freq: Once | ORAL | Status: AC
  Administered 2012-10-22: 4 mg via ORAL
  Filled 2012-10-22: qty 1

## 2012-10-22 NOTE — ED Provider Notes (Signed)
CSN: [id number]     Arrival date & time 10/22/12  0719 History   First MD Initiated Contact with Patient 10/22/12 [phone]     Chief Complaint  Patient presents with  . Sore Throat  . Eye Drainage   (Consider location/radiation/quality/duration/timing/severity/associated sxs/prior Treatment) HPI   This is a 78 year old female who presents with sore throat, cough, and bilateral eye drainage. Patient was seen 3 days ago by me. At that time she was diagnosed with viral pharyngitis. Patient reports continued sore throat. She now reports cough productive of sputum. She states "things get stuck in my throat." She denies any fevers. This morning she states that she had to use warm compresses to open up her eyes and noted drainage from both. This is new.  Patient had known sick contacts. Patient continues to report difficulty swallowing but states that she is staying hydrated and drinking a lot of Pepsi.  She states the Lortab has been helping.  Patient had a negative strep screen on Wednesday.  Patient denies any shortness of breath or chest pain. Past Medical History  Diagnosis Date  . SOB (shortness of breath)   . Cough   . Dysphagia   . Hiatal hernia   . Diabetes mellitus without complication   . Hypertension    Past Surgical History  Procedure Laterality Date  . Vesicovaginal fistula closure w/ tah  1976   Family History  Problem Relation Age of Onset  . Asthma Sister   . Sleep apnea Sister   . Heart disease Sister   . Heart disease Father    History  Substance Use Topics  . Smoking status: Never Smoker   . Smokeless tobacco: Never Used  . Alcohol Use: No   OB History   Grav Para Term Preterm Abortions TAB SAB Ect Mult Living                 Review of Systems  Constitutional: Negative for fever.  HENT: Positive for sore throat.   Eyes: Positive for discharge, redness and itching.  Respiratory: Positive for cough. Negative for chest tightness and shortness of breath.    Cardiovascular: Negative for chest pain.  Gastrointestinal: Negative for nausea, vomiting and abdominal pain.  Genitourinary: Negative for dysuria.  Musculoskeletal: Negative for back pain and myalgias.  Skin: Negative for rash.  Neurological: Negative for headaches.  All other systems reviewed and are negative.    Allergies  Pineapple; Codeine; Penicillins; and Rofecoxib  Home Medications   Current Outpatient Rx  Name  Route  Sig  Dispense  Refill  . amLODipine-olmesartan (AZOR) 10-40 MG per tablet   Oral   Take 1 tablet by mouth daily.         Marland Kitchen aspirin 81 MG tablet   Oral   Take 81 mg by mouth every morning.          . chlorthalidone (HYGROTON) 25 MG tablet   Oral   Take 25 mg by mouth daily.          Marland Kitchen glyBURIDE-metformin (GLUCOVANCE) 5-500 MG per tablet   Oral   Take 2 tablets by mouth 2 (two) times daily with a meal.           . HYDROcodone-acetaminophen (HYCET) 7.5-325 mg/15 ml solution   Oral   Take 10 mLs by mouth every 8 (eight) hours as needed for pain.   120 mL   0   . levothyroxine (SYNTHROID, LEVOTHROID) 150 MCG tablet   Oral   Take  150 mcg by mouth every morning.          Marland Kitchen omeprazole (PRILOSEC) 20 MG capsule   Oral   Take 20 mg by mouth daily.          . pseudoephedrine-guaifenesin (TUSSIN PE) 30-100 MG/5ML SYRP   Oral   Take 5 mLs by mouth every 4 (four) hours as needed (cough).         . rosuvastatin (CRESTOR) 40 MG tablet   Oral   Take 40 mg by mouth every evening.           BP 181/90  Pulse 90  Temp(Src) 99.4 F (37.4 C) (Oral)  Resp 18  Ht 5\' 4"  (1.626 m)  Wt 159 lb (72.122 kg)  BMI 27.28 kg/m2  SpO2 99% Physical Exam  Nursing note and vitals reviewed. Constitutional: She is oriented to person, place, and time.  Elderly, no acute distress  HENT:  Head: Normocephalic and atraumatic.  Right Ear: External ear normal.  Left Ear: External ear normal.  Mouth/Throat: Oropharynx is clear and moist. No  oropharyngeal exudate.  No trismus, no tonsillar swelling  Eyes: Pupils are equal, round, and reactive to light. Right eye exhibits discharge. Left eye exhibits discharge.  Bilateral injected conjunctiva with drainage noted  Neck: Neck supple.  Cardiovascular: Normal rate, regular rhythm and normal heart sounds.   No murmur heard. Pulmonary/Chest: Effort normal and breath sounds normal. No respiratory distress. She has no wheezes.  Abdominal: Soft. Bowel sounds are normal. She exhibits distension. There is no tenderness.  Neurological: She is alert and oriented to person, place, and time.  Skin: Skin is warm and dry. No rash noted.  Psychiatric: She has a normal mood and affect.    ED Course  Procedures (including critical care time) Labs Review Labs Reviewed - No data to display Imaging Review Dg Chest 2 View  10/22/2012   CLINICAL DATA:  Progressive cough.  EXAM: CHEST  2 VIEW  COMPARISON:  04/07/2012  FINDINGS: There is lung base bronchial wall thickening and reticular opacities, left greater than right, which is similar to the prior exam. This may be chronic bronchitic change. An acute bronchitis or interstitial infiltrate should be considered likely in the proper clinical setting.  The lungs are otherwise clear. No pleural effusion or pneumothorax.  Cardiac silhouette is borderline enlarged. The aorta is mildly uncoiled. No mediastinal or hilar masses.  IMPRESSION: 1. Left greater than right basilar opacity as detailed above may reflect chronic bronchitic change or a more acute bronchitis/interstitial infiltrate. 2. No other evidence of an acute abnormality.   Electronically Signed   By: Amie Portland M.D.   On: 10/22/2012 08:19    EKG Interpretation   None       MDM   1. Viral conjunctivitis   2. Bronchitis    This is a 54 rolled female who presents with sore throat, cough, and bilateral eye drainage. She is nontoxic-appearing on exam. Vital signs are notable for a blood  pressure of 181/90. Physical exam is notable for bilateral conjunctivitis. Oropharynx is clear without exudate or swelling. Patient does appear to have a lot of postnasal drip. X-ray was obtained given cough and chronicity of symptoms. X-ray shows left greater than right basilar opacity which is similar to prior exams and may show acute bronchitis versus infiltrate. My suspicion is this is bronchitis as the patient has been afebrile. Patient was given 4 mg of Decadron for sore throat and instructed to monitor her sugars.  She was encouraged to use saline eyedrops for comfort. She was again given return precautions. She was by mouth challenge prior to discharge. Again I feel her constellation of symptoms are most likely viral in nature. She's to followup with her primary care Dr. on Monday.  After history, exam, and medical workup I feel the patient has been appropriately medically screened and is safe for discharge home. Pertinent diagnoses were discussed with the patient. Patient was given return precautions.     Shon Baton, MD 10/22/12 [phone]

## 2012-10-22 NOTE — ED Notes (Signed)
Pt given 8 ounces of water; pt able to tolerate water.

## 2012-10-22 NOTE — ED Notes (Signed)
Per pt was seen here on Wednesday diagnosed with viral laryngitis. Pt reports sore throat, coughing up yellow sputum, and new onset of eye drainage since yesterday.

## 2012-10-22 NOTE — ED Notes (Signed)
Patient transported to X-ray 

## 2012-11-02 ENCOUNTER — Ambulatory Visit: Payer: Medicare Other

## 2012-11-15 DIAGNOSIS — H16229 Keratoconjunctivitis sicca, not specified as Sjogren's, unspecified eye: Secondary | ICD-10-CM | POA: Diagnosis not present

## 2012-11-15 DIAGNOSIS — H40039 Anatomical narrow angle, unspecified eye: Secondary | ICD-10-CM | POA: Diagnosis not present

## 2012-11-18 ENCOUNTER — Ambulatory Visit
Admission: RE | Admit: 2012-11-18 | Discharge: 2012-11-18 | Disposition: A | Discharge status: Home / Self Care | Payer: Medicare Other | Source: Ambulatory Visit

## 2012-11-18 DIAGNOSIS — Z1231 Encounter for screening mammogram for malignant neoplasm of breast: Secondary | ICD-10-CM | POA: Diagnosis not present

## 2012-12-19 ENCOUNTER — Emergency Department (HOSPITAL_COMMUNITY): Payer: Medicare Other

## 2012-12-19 ENCOUNTER — Encounter (HOSPITAL_COMMUNITY): Payer: Self-pay | Admitting: Emergency Medicine

## 2012-12-19 ENCOUNTER — Emergency Department (HOSPITAL_COMMUNITY)
Admission: EM | Admit: 2012-12-19 | Discharge: 2012-12-19 | Disposition: A | Discharge status: Home / Self Care | Payer: Medicare Other | Attending: Emergency Medicine | Admitting: Emergency Medicine

## 2012-12-19 DIAGNOSIS — S8990XA Unspecified injury of unspecified lower leg, initial encounter: Secondary | ICD-10-CM | POA: Diagnosis not present

## 2012-12-19 DIAGNOSIS — R4789 Other speech disturbances: Secondary | ICD-10-CM | POA: Insufficient documentation

## 2012-12-19 DIAGNOSIS — Z79899 Other long term (current) drug therapy: Secondary | ICD-10-CM | POA: Diagnosis not present

## 2012-12-19 DIAGNOSIS — M79609 Pain in unspecified limb: Secondary | ICD-10-CM | POA: Diagnosis not present

## 2012-12-19 DIAGNOSIS — E119 Type 2 diabetes mellitus without complications: Secondary | ICD-10-CM | POA: Diagnosis not present

## 2012-12-19 DIAGNOSIS — Z8719 Personal history of other diseases of the digestive system: Secondary | ICD-10-CM | POA: Diagnosis not present

## 2012-12-19 DIAGNOSIS — IMO0002 Reserved for concepts with insufficient information to code with codable children: Secondary | ICD-10-CM | POA: Diagnosis not present

## 2012-12-19 DIAGNOSIS — W010XXA Fall on same level from slipping, tripping and stumbling without subsequent striking against object, initial encounter: Secondary | ICD-10-CM | POA: Insufficient documentation

## 2012-12-19 DIAGNOSIS — Z880 Allergy status to penicillin: Secondary | ICD-10-CM | POA: Diagnosis not present

## 2012-12-19 DIAGNOSIS — Z7982 Long term (current) use of aspirin: Secondary | ICD-10-CM | POA: Insufficient documentation

## 2012-12-19 DIAGNOSIS — S8010XA Contusion of unspecified lower leg, initial encounter: Secondary | ICD-10-CM | POA: Insufficient documentation

## 2012-12-19 DIAGNOSIS — M79604 Pain in right leg: Secondary | ICD-10-CM

## 2012-12-19 DIAGNOSIS — R490 Dysphonia: Secondary | ICD-10-CM

## 2012-12-19 DIAGNOSIS — W19XXXA Unspecified fall, initial encounter: Secondary | ICD-10-CM

## 2012-12-19 DIAGNOSIS — I10 Essential (primary) hypertension: Secondary | ICD-10-CM | POA: Diagnosis not present

## 2012-12-19 DIAGNOSIS — Y9389 Activity, other specified: Secondary | ICD-10-CM | POA: Insufficient documentation

## 2012-12-19 DIAGNOSIS — M25569 Pain in unspecified knee: Secondary | ICD-10-CM | POA: Diagnosis not present

## 2012-12-19 DIAGNOSIS — Y92009 Unspecified place in unspecified non-institutional (private) residence as the place of occurrence of the external cause: Secondary | ICD-10-CM | POA: Insufficient documentation

## 2012-12-19 NOTE — ED Provider Notes (Signed)
CSN: [id number]     Arrival date & time 12/19/12  1204 History   First MD Initiated Contact with Patient 12/19/12 1419     Chief Complaint  Patient presents with  . Leg Pain  . Fall   (Consider location/radiation/quality/duration/timing/severity/associated sxs/prior Treatment) The history is provided by the patient and medical records.   This is a 78 year old female with past medical history significant for diabetes, hypertension, presenting to the ED s/p fall at home PTA.  Pt states she tripped over an extension cord on her front porch causing her to fall onto the concrete steps, impact on right shin.  Denies any head trauma or loss of consciousness. Patient has been ambulatory since incident with only minimal pain.  Pt currently on ASA, no other anti-coagulants.  No meds taken PTA.  Pt also complains of hoarseness.  States this has been intermittent for the past several months.  Denies any sore throat, pain or difficulty with swallowing.   No recent sick contacts.  No fevers, sweats, or chills.  States she has talked to PCP about this without plan for further evaluation.   Past Medical History  Diagnosis Date  . SOB (shortness of breath)   . Cough   . Dysphagia   . Hiatal hernia   . Diabetes mellitus without complication   . Hypertension    Past Surgical History  Procedure Laterality Date  . Vesicovaginal fistula closure w/ tah  1976   Family History  Problem Relation Age of Onset  . Asthma Sister   . Sleep apnea Sister   . Heart disease Sister   . Heart disease Father    History  Substance Use Topics  . Smoking status: Never Smoker   . Smokeless tobacco: Never Used  . Alcohol Use: No   OB History   Grav Para Term Preterm Abortions TAB SAB Ect Mult Living                 Review of Systems  Musculoskeletal: Positive for arthralgias.  All other systems reviewed and are negative.    Allergies  Pineapple; Codeine; Penicillins; and Rofecoxib  Home Medications    Current Outpatient Rx  Name  Route  Sig  Dispense  Refill  . amLODipine-olmesartan (AZOR) 10-40 MG per tablet   Oral   Take 1 tablet by mouth daily.         Marland Kitchen aspirin 81 MG tablet   Oral   Take 81 mg by mouth every morning.          . chlorthalidone (HYGROTON) 25 MG tablet   Oral   Take 25 mg by mouth daily.          Marland Kitchen glyBURIDE-metformin (GLUCOVANCE) 5-500 MG per tablet   Oral   Take 2 tablets by mouth 2 (two) times daily with a meal.           . HYDROcodone-acetaminophen (HYCET) 7.5-325 mg/15 ml solution   Oral   Take 10 mLs by mouth every 8 (eight) hours as needed for pain.   120 mL   0   . levothyroxine (SYNTHROID, LEVOTHROID) 150 MCG tablet   Oral   Take 150 mcg by mouth every morning.          Marland Kitchen omeprazole (PRILOSEC) 20 MG capsule   Oral   Take 20 mg by mouth daily.          . pseudoephedrine-guaifenesin (TUSSIN PE) 30-100 MG/5ML SYRP   Oral   Take 5 mLs by  mouth every 4 (four) hours as needed (cough).         . rosuvastatin (CRESTOR) 40 MG tablet   Oral   Take 40 mg by mouth every evening.           BP 145/92  Pulse 88  Temp(Src) 98.4 F (36.9 C) (Oral)  Resp 18  Ht 5\' 4"  (1.626 m)  Wt 158 lb (71.668 kg)  BMI 27.11 kg/m2  SpO2 100%  Physical Exam  Nursing note and vitals reviewed. Constitutional: She is oriented to person, place, and time. She appears well-developed and well-nourished. No distress.  HENT:  Head: Normocephalic and atraumatic. Head is without raccoon's eyes, without Battle's sign, without abrasion, without contusion and without laceration.  Right Ear: Tympanic membrane and ear canal normal.  Left Ear: Tympanic membrane and ear canal normal.  Nose: Nose normal.  Mouth/Throat: Uvula is midline, oropharynx is clear and moist and mucous membranes are normal. No uvula swelling. No oropharyngeal exudate, posterior oropharyngeal edema, posterior oropharyngeal erythema or tonsillar abscesses.  No visible signs of head  trauma  Eyes: Conjunctivae and EOM are normal. Pupils are equal, round, and reactive to light.  Neck: Normal range of motion.  Cardiovascular: Normal rate, regular rhythm and normal heart sounds.   Pulmonary/Chest: Effort normal and breath sounds normal. No respiratory distress. She has no wheezes.  Musculoskeletal: Normal range of motion. She exhibits no edema.  2 cm abrasion to right shin that is TTP with associated hematoma and bruising; strong distal pulse and cap refill; sensation intact; normal gait  Neurological: She is alert and oriented to person, place, and time.  Skin: Skin is warm and dry. She is not diaphoretic.  Psychiatric: She has a normal mood and affect.    ED Course  Procedures (including critical care time) Labs Review Labs Reviewed - No data to display Imaging Review Dg Tibia/fibula Right  12/19/2012   CLINICAL DATA:  Status post fall. Abrasion anterior right lower leg.  EXAM: RIGHT TIBIA AND FIBULA - 2 VIEW  COMPARISON:  Plain films right lower leg 03/20/2010.  FINDINGS: No fracture or foreign body is identified. There appears to be soft tissue swelling anterior to proximal tibia. Soft tissue calcifications anterior aspect of the superior right lower leg are unchanged.  IMPRESSION: Soft tissue swelling without fracture or foreign body.   Electronically Signed   By: Drusilla Kanner M.D.   On: 12/19/2012 14:41    EKG Interpretation   None       MDM   1. Fall, initial encounter   2. Leg pain, anterior, right   3. Hoarseness    X-ray negative for acute fx.  HEENT exam unremarkable today.  Intermittent hoarseness of unknown etiology.  Will refer to ENT for further evaluation.  Instructed to take OTC tylenol/motrin as needed for pain as well as follow RICE routine to help with swelling.  Discussed plan with pt, she agreed.  Return precautions advised.  Garlon Hatchet, PA-C 12/19/12 [phone]

## 2012-12-19 NOTE — ED Provider Notes (Signed)
Medical screening examination/treatment/procedure(s) were conducted as a shared visit with non-physician practitioner(s) and myself.  I personally evaluated the patient during the encounter.  EKG Interpretation   None        Well appearing. Mechanical fall. Imaging negative. Dc home in good condition  Lyanne Co, MD 12/19/12 [phone]

## 2012-12-19 NOTE — ED Notes (Signed)
Patient reports that she tripped on an extension cord on a concrete porch and fell landing on a concrete step. Patient c/o right leg pain and has a hematoma with a small abrasion present. Patient states her right calf is sore as well. Patient denies taking blood thinners.

## 2012-12-23 DIAGNOSIS — E785 Hyperlipidemia, unspecified: Secondary | ICD-10-CM | POA: Diagnosis not present

## 2012-12-23 DIAGNOSIS — IMO0001 Reserved for inherently not codable concepts without codable children: Secondary | ICD-10-CM | POA: Diagnosis not present

## 2012-12-23 DIAGNOSIS — R498 Other voice and resonance disorders: Secondary | ICD-10-CM | POA: Diagnosis not present

## 2012-12-23 DIAGNOSIS — J309 Allergic rhinitis, unspecified: Secondary | ICD-10-CM | POA: Diagnosis not present

## 2012-12-23 DIAGNOSIS — E039 Hypothyroidism, unspecified: Secondary | ICD-10-CM | POA: Diagnosis not present

## 2012-12-23 DIAGNOSIS — L0291 Cutaneous abscess, unspecified: Secondary | ICD-10-CM | POA: Diagnosis not present

## 2012-12-23 DIAGNOSIS — E1142 Type 2 diabetes mellitus with diabetic polyneuropathy: Secondary | ICD-10-CM | POA: Diagnosis not present

## 2012-12-23 DIAGNOSIS — I10 Essential (primary) hypertension: Secondary | ICD-10-CM | POA: Diagnosis not present

## 2013-01-10 ENCOUNTER — Encounter: Payer: Self-pay | Admitting: Podiatry

## 2013-01-10 ENCOUNTER — Ambulatory Visit (INDEPENDENT_AMBULATORY_CARE_PROVIDER_SITE_OTHER): Payer: Medicare Other | Admitting: Podiatry

## 2013-01-10 VITALS — BP 139/74 | HR 84 | Ht 64.0 in | Wt 155.0 lb

## 2013-01-10 DIAGNOSIS — IMO0001 Reserved for inherently not codable concepts without codable children: Secondary | ICD-10-CM | POA: Diagnosis not present

## 2013-01-10 DIAGNOSIS — E039 Hypothyroidism, unspecified: Secondary | ICD-10-CM | POA: Diagnosis not present

## 2013-01-10 DIAGNOSIS — M79609 Pain in unspecified limb: Secondary | ICD-10-CM

## 2013-01-10 DIAGNOSIS — L600 Ingrowing nail: Secondary | ICD-10-CM | POA: Diagnosis not present

## 2013-01-10 DIAGNOSIS — L0291 Cutaneous abscess, unspecified: Secondary | ICD-10-CM | POA: Diagnosis not present

## 2013-01-10 DIAGNOSIS — M79676 Pain in unspecified toe(s): Secondary | ICD-10-CM | POA: Insufficient documentation

## 2013-01-10 DIAGNOSIS — K113 Abscess of salivary gland: Secondary | ICD-10-CM | POA: Diagnosis not present

## 2013-01-10 DIAGNOSIS — E1142 Type 2 diabetes mellitus with diabetic polyneuropathy: Secondary | ICD-10-CM | POA: Diagnosis not present

## 2013-01-10 DIAGNOSIS — J309 Allergic rhinitis, unspecified: Secondary | ICD-10-CM | POA: Diagnosis not present

## 2013-01-10 DIAGNOSIS — B351 Tinea unguium: Secondary | ICD-10-CM

## 2013-01-10 DIAGNOSIS — E785 Hyperlipidemia, unspecified: Secondary | ICD-10-CM | POA: Diagnosis not present

## 2013-01-10 DIAGNOSIS — I10 Essential (primary) hypertension: Secondary | ICD-10-CM | POA: Diagnosis not present

## 2013-01-10 NOTE — Patient Instructions (Signed)
Seen for hypertrophic nails. All nails debrided. May benefit from Vitamin A cream for dry skin.  Return in 3 months or as needed.  

## 2013-01-10 NOTE — Progress Notes (Signed)
Subjective:  78 year old female presents requesting toe nails cut. Her blood sugar is under control.  Patient complained of both heels being crack and dry. Stated that she has had un accident and hurt her right lower limb. There was no bleeding but had many areas with black and blue mark.   Objective: All pedal pulses are palpable.  Thick yellow nails x 10.  No visible skin lesions or bumps, or discolored lesions noted.   Assessment: Onychomycosis x 10.  NIDDM under control.  Xerotic skin without deep fissure.    Plan: Debrided all nails.  Advised to use Vitamin A cream for dry skin.  Return as needed.

## 2013-01-11 DIAGNOSIS — E785 Hyperlipidemia, unspecified: Secondary | ICD-10-CM | POA: Diagnosis not present

## 2013-01-11 DIAGNOSIS — J309 Allergic rhinitis, unspecified: Secondary | ICD-10-CM | POA: Diagnosis not present

## 2013-01-11 DIAGNOSIS — E1142 Type 2 diabetes mellitus with diabetic polyneuropathy: Secondary | ICD-10-CM | POA: Diagnosis not present

## 2013-01-11 DIAGNOSIS — K113 Abscess of salivary gland: Secondary | ICD-10-CM | POA: Diagnosis not present

## 2013-01-11 DIAGNOSIS — E039 Hypothyroidism, unspecified: Secondary | ICD-10-CM | POA: Diagnosis not present

## 2013-01-11 DIAGNOSIS — L0291 Cutaneous abscess, unspecified: Secondary | ICD-10-CM | POA: Diagnosis not present

## 2013-01-11 DIAGNOSIS — I10 Essential (primary) hypertension: Secondary | ICD-10-CM | POA: Diagnosis not present

## 2013-01-11 DIAGNOSIS — IMO0001 Reserved for inherently not codable concepts without codable children: Secondary | ICD-10-CM | POA: Diagnosis not present

## 2013-01-12 LAB — HM DEXA SCAN: HM Dexa Scan: NORMAL

## 2013-01-17 DIAGNOSIS — R498 Other voice and resonance disorders: Secondary | ICD-10-CM | POA: Diagnosis not present

## 2013-01-17 DIAGNOSIS — K219 Gastro-esophageal reflux disease without esophagitis: Secondary | ICD-10-CM | POA: Diagnosis not present

## 2013-01-26 DIAGNOSIS — IMO0001 Reserved for inherently not codable concepts without codable children: Secondary | ICD-10-CM | POA: Diagnosis not present

## 2013-01-26 DIAGNOSIS — L0291 Cutaneous abscess, unspecified: Secondary | ICD-10-CM | POA: Diagnosis not present

## 2013-01-26 DIAGNOSIS — J309 Allergic rhinitis, unspecified: Secondary | ICD-10-CM | POA: Diagnosis not present

## 2013-01-26 DIAGNOSIS — I10 Essential (primary) hypertension: Secondary | ICD-10-CM | POA: Diagnosis not present

## 2013-01-26 DIAGNOSIS — E039 Hypothyroidism, unspecified: Secondary | ICD-10-CM | POA: Diagnosis not present

## 2013-01-26 DIAGNOSIS — E1142 Type 2 diabetes mellitus with diabetic polyneuropathy: Secondary | ICD-10-CM | POA: Diagnosis not present

## 2013-01-26 DIAGNOSIS — L0390 Cellulitis, unspecified: Secondary | ICD-10-CM | POA: Diagnosis not present

## 2013-01-26 DIAGNOSIS — K113 Abscess of salivary gland: Secondary | ICD-10-CM | POA: Diagnosis not present

## 2013-01-26 DIAGNOSIS — E785 Hyperlipidemia, unspecified: Secondary | ICD-10-CM | POA: Diagnosis not present

## 2013-01-27 ENCOUNTER — Other Ambulatory Visit: Payer: Self-pay | Admitting: Otolaryngology

## 2013-01-27 DIAGNOSIS — K219 Gastro-esophageal reflux disease without esophagitis: Secondary | ICD-10-CM | POA: Diagnosis not present

## 2013-01-27 DIAGNOSIS — J383 Other diseases of vocal cords: Secondary | ICD-10-CM | POA: Diagnosis not present

## 2013-01-27 DIAGNOSIS — R1310 Dysphagia, unspecified: Secondary | ICD-10-CM | POA: Diagnosis not present

## 2013-01-27 DIAGNOSIS — J381 Polyp of vocal cord and larynx: Secondary | ICD-10-CM | POA: Diagnosis not present

## 2013-02-17 DIAGNOSIS — IMO0001 Reserved for inherently not codable concepts without codable children: Secondary | ICD-10-CM | POA: Diagnosis not present

## 2013-02-17 DIAGNOSIS — J309 Allergic rhinitis, unspecified: Secondary | ICD-10-CM | POA: Diagnosis not present

## 2013-02-17 DIAGNOSIS — E785 Hyperlipidemia, unspecified: Secondary | ICD-10-CM | POA: Diagnosis not present

## 2013-02-17 DIAGNOSIS — I10 Essential (primary) hypertension: Secondary | ICD-10-CM | POA: Diagnosis not present

## 2013-02-17 DIAGNOSIS — L0390 Cellulitis, unspecified: Secondary | ICD-10-CM | POA: Diagnosis not present

## 2013-02-17 DIAGNOSIS — K219 Gastro-esophageal reflux disease without esophagitis: Secondary | ICD-10-CM | POA: Diagnosis not present

## 2013-02-17 DIAGNOSIS — L0291 Cutaneous abscess, unspecified: Secondary | ICD-10-CM | POA: Diagnosis not present

## 2013-02-17 DIAGNOSIS — E039 Hypothyroidism, unspecified: Secondary | ICD-10-CM | POA: Diagnosis not present

## 2013-02-17 DIAGNOSIS — E1142 Type 2 diabetes mellitus with diabetic polyneuropathy: Secondary | ICD-10-CM | POA: Diagnosis not present

## 2013-04-13 DIAGNOSIS — H00029 Hordeolum internum unspecified eye, unspecified eyelid: Secondary | ICD-10-CM | POA: Diagnosis not present

## 2013-04-14 ENCOUNTER — Ambulatory Visit: Payer: Medicare Other | Admitting: Podiatry

## 2013-04-18 ENCOUNTER — Encounter: Payer: Self-pay | Admitting: Podiatry

## 2013-04-18 ENCOUNTER — Ambulatory Visit (INDEPENDENT_AMBULATORY_CARE_PROVIDER_SITE_OTHER): Payer: Medicare Other | Admitting: Podiatry

## 2013-04-18 VITALS — BP 152/70 | HR 90

## 2013-04-18 DIAGNOSIS — L600 Ingrowing nail: Secondary | ICD-10-CM | POA: Diagnosis not present

## 2013-04-18 DIAGNOSIS — B351 Tinea unguium: Secondary | ICD-10-CM | POA: Diagnosis not present

## 2013-04-18 DIAGNOSIS — M79606 Pain in leg, unspecified: Secondary | ICD-10-CM

## 2013-04-18 DIAGNOSIS — M79609 Pain in unspecified limb: Secondary | ICD-10-CM

## 2013-04-18 HISTORY — DX: Pain in leg, unspecified: M79.606

## 2013-04-18 NOTE — Patient Instructions (Signed)
Seen for hypertrophic nails. All nails debrided. Return in 3 months or as needed.  

## 2013-04-18 NOTE — Progress Notes (Signed)
Subjective:  79 year old female presents requesting toe nails cut.   Objective: All pedal pulses are palpable.  Thick yellow nails x 10.  Severely deformed discolored nails both great toes.  No visible skin lesions or bumps, or discolored lesions noted.   Assessment: Onychomycosis x 10.  NIDDM under control.   Plan: Debrided all nails.  Return as needed

## 2013-04-20 DIAGNOSIS — H00029 Hordeolum internum unspecified eye, unspecified eyelid: Secondary | ICD-10-CM | POA: Diagnosis not present

## 2013-04-25 DIAGNOSIS — H00029 Hordeolum internum unspecified eye, unspecified eyelid: Secondary | ICD-10-CM | POA: Diagnosis not present

## 2013-04-26 DIAGNOSIS — H10439 Chronic follicular conjunctivitis, unspecified eye: Secondary | ICD-10-CM | POA: Diagnosis not present

## 2013-05-16 DIAGNOSIS — M545 Low back pain, unspecified: Secondary | ICD-10-CM | POA: Diagnosis not present

## 2013-05-16 DIAGNOSIS — H1045 Other chronic allergic conjunctivitis: Secondary | ICD-10-CM | POA: Diagnosis not present

## 2013-05-18 ENCOUNTER — Other Ambulatory Visit: Payer: Self-pay | Admitting: Orthopaedic Surgery

## 2013-05-18 DIAGNOSIS — IMO0001 Reserved for inherently not codable concepts without codable children: Secondary | ICD-10-CM | POA: Diagnosis not present

## 2013-05-18 DIAGNOSIS — M545 Low back pain, unspecified: Secondary | ICD-10-CM

## 2013-05-18 DIAGNOSIS — R972 Elevated prostate specific antigen [PSA]: Secondary | ICD-10-CM | POA: Diagnosis not present

## 2013-05-18 DIAGNOSIS — R5383 Other fatigue: Secondary | ICD-10-CM | POA: Diagnosis not present

## 2013-05-18 DIAGNOSIS — J309 Allergic rhinitis, unspecified: Secondary | ICD-10-CM | POA: Diagnosis not present

## 2013-05-18 DIAGNOSIS — R5381 Other malaise: Secondary | ICD-10-CM | POA: Diagnosis not present

## 2013-05-18 DIAGNOSIS — K219 Gastro-esophageal reflux disease without esophagitis: Secondary | ICD-10-CM | POA: Diagnosis not present

## 2013-05-18 DIAGNOSIS — E785 Hyperlipidemia, unspecified: Secondary | ICD-10-CM | POA: Diagnosis not present

## 2013-05-18 DIAGNOSIS — E039 Hypothyroidism, unspecified: Secondary | ICD-10-CM | POA: Diagnosis not present

## 2013-05-18 DIAGNOSIS — E1142 Type 2 diabetes mellitus with diabetic polyneuropathy: Secondary | ICD-10-CM | POA: Diagnosis not present

## 2013-05-18 DIAGNOSIS — I10 Essential (primary) hypertension: Secondary | ICD-10-CM | POA: Diagnosis not present

## 2013-05-27 ENCOUNTER — Ambulatory Visit
Admission: RE | Admit: 2013-05-27 | Discharge: 2013-05-27 | Disposition: A | Discharge status: Home / Self Care | Payer: Medicare Other | Source: Ambulatory Visit | Attending: Orthopaedic Surgery | Admitting: Orthopaedic Surgery

## 2013-05-27 DIAGNOSIS — M5126 Other intervertebral disc displacement, lumbar region: Secondary | ICD-10-CM | POA: Diagnosis not present

## 2013-05-27 DIAGNOSIS — M545 Low back pain, unspecified: Secondary | ICD-10-CM

## 2013-05-27 DIAGNOSIS — M48061 Spinal stenosis, lumbar region without neurogenic claudication: Secondary | ICD-10-CM | POA: Diagnosis not present

## 2013-06-07 ENCOUNTER — Encounter: Payer: Self-pay | Admitting: Podiatry

## 2013-06-07 ENCOUNTER — Ambulatory Visit (INDEPENDENT_AMBULATORY_CARE_PROVIDER_SITE_OTHER): Payer: Medicare Other | Admitting: Podiatry

## 2013-06-07 VITALS — BP 153/80 | HR 90

## 2013-06-07 DIAGNOSIS — M79606 Pain in leg, unspecified: Secondary | ICD-10-CM

## 2013-06-07 DIAGNOSIS — M79609 Pain in unspecified limb: Secondary | ICD-10-CM | POA: Diagnosis not present

## 2013-06-07 DIAGNOSIS — L600 Ingrowing nail: Secondary | ICD-10-CM

## 2013-06-07 DIAGNOSIS — M48061 Spinal stenosis, lumbar region without neurogenic claudication: Secondary | ICD-10-CM | POA: Diagnosis not present

## 2013-06-07 DIAGNOSIS — M4310 Spondylolisthesis, site unspecified: Secondary | ICD-10-CM | POA: Diagnosis not present

## 2013-06-07 NOTE — Progress Notes (Signed)
States that she has Sciatica and is schedules to have surgery on the nerve. Also has ingrown nail on right great toe want to be looked at.  Noted of abnormal thick nail growth through medial border of right great toe nail.   Objective: Ingrown nail right great toe nail without infection.  Assessment: Ingrown mycotic nail right hallux medial border.  Plan: Debrided ingrown nail right hallux.  Return as needed.

## 2013-06-07 NOTE — Patient Instructions (Signed)
Painful ingrown nail right great toe medial border. Nail debrided. No infection or drainage noted. Return as needed.

## 2013-06-13 DIAGNOSIS — E1142 Type 2 diabetes mellitus with diabetic polyneuropathy: Secondary | ICD-10-CM | POA: Diagnosis not present

## 2013-06-13 DIAGNOSIS — M4800 Spinal stenosis, site unspecified: Secondary | ICD-10-CM | POA: Diagnosis not present

## 2013-06-13 DIAGNOSIS — K219 Gastro-esophageal reflux disease without esophagitis: Secondary | ICD-10-CM | POA: Diagnosis not present

## 2013-06-13 DIAGNOSIS — J309 Allergic rhinitis, unspecified: Secondary | ICD-10-CM | POA: Diagnosis not present

## 2013-06-13 DIAGNOSIS — I10 Essential (primary) hypertension: Secondary | ICD-10-CM | POA: Diagnosis not present

## 2013-06-13 DIAGNOSIS — E785 Hyperlipidemia, unspecified: Secondary | ICD-10-CM | POA: Diagnosis not present

## 2013-06-13 DIAGNOSIS — IMO0001 Reserved for inherently not codable concepts without codable children: Secondary | ICD-10-CM | POA: Diagnosis not present

## 2013-06-13 DIAGNOSIS — E039 Hypothyroidism, unspecified: Secondary | ICD-10-CM | POA: Diagnosis not present

## 2013-07-17 DIAGNOSIS — R339 Retention of urine, unspecified: Secondary | ICD-10-CM | POA: Diagnosis not present

## 2013-07-18 ENCOUNTER — Ambulatory Visit: Payer: Medicare Other | Admitting: Podiatry

## 2013-07-26 DIAGNOSIS — E1142 Type 2 diabetes mellitus with diabetic polyneuropathy: Secondary | ICD-10-CM | POA: Diagnosis not present

## 2013-07-26 DIAGNOSIS — M4800 Spinal stenosis, site unspecified: Secondary | ICD-10-CM | POA: Diagnosis not present

## 2013-07-26 DIAGNOSIS — I10 Essential (primary) hypertension: Secondary | ICD-10-CM | POA: Diagnosis not present

## 2013-07-26 DIAGNOSIS — E785 Hyperlipidemia, unspecified: Secondary | ICD-10-CM | POA: Diagnosis not present

## 2013-07-26 DIAGNOSIS — Z01810 Encounter for preprocedural cardiovascular examination: Secondary | ICD-10-CM | POA: Diagnosis not present

## 2013-07-26 DIAGNOSIS — E039 Hypothyroidism, unspecified: Secondary | ICD-10-CM | POA: Diagnosis not present

## 2013-07-26 DIAGNOSIS — IMO0001 Reserved for inherently not codable concepts without codable children: Secondary | ICD-10-CM | POA: Diagnosis not present

## 2013-07-26 DIAGNOSIS — Z01811 Encounter for preprocedural respiratory examination: Secondary | ICD-10-CM | POA: Diagnosis not present

## 2013-08-05 ENCOUNTER — Emergency Department (HOSPITAL_COMMUNITY): Payer: Medicare Other

## 2013-08-05 ENCOUNTER — Observation Stay (HOSPITAL_COMMUNITY)
Admission: EM | Admit: 2013-08-05 | Discharge: 2013-08-07 | Disposition: A | Discharge status: Home / Self Care | Payer: Medicare Other | Attending: Internal Medicine | Admitting: Internal Medicine

## 2013-08-05 ENCOUNTER — Encounter (HOSPITAL_COMMUNITY): Payer: Self-pay | Admitting: Emergency Medicine

## 2013-08-05 DIAGNOSIS — R1084 Generalized abdominal pain: Secondary | ICD-10-CM | POA: Diagnosis present

## 2013-08-05 DIAGNOSIS — Z79899 Other long term (current) drug therapy: Secondary | ICD-10-CM | POA: Insufficient documentation

## 2013-08-05 DIAGNOSIS — E1129 Type 2 diabetes mellitus with other diabetic kidney complication: Secondary | ICD-10-CM | POA: Diagnosis not present

## 2013-08-05 DIAGNOSIS — E119 Type 2 diabetes mellitus without complications: Secondary | ICD-10-CM | POA: Diagnosis not present

## 2013-08-05 DIAGNOSIS — E1165 Type 2 diabetes mellitus with hyperglycemia: Secondary | ICD-10-CM | POA: Diagnosis present

## 2013-08-05 DIAGNOSIS — K219 Gastro-esophageal reflux disease without esophagitis: Secondary | ICD-10-CM | POA: Diagnosis not present

## 2013-08-05 DIAGNOSIS — I129 Hypertensive chronic kidney disease with stage 1 through stage 4 chronic kidney disease, or unspecified chronic kidney disease: Secondary | ICD-10-CM | POA: Diagnosis present

## 2013-08-05 DIAGNOSIS — E785 Hyperlipidemia, unspecified: Secondary | ICD-10-CM | POA: Insufficient documentation

## 2013-08-05 DIAGNOSIS — E039 Hypothyroidism, unspecified: Secondary | ICD-10-CM | POA: Diagnosis not present

## 2013-08-05 DIAGNOSIS — Z7982 Long term (current) use of aspirin: Secondary | ICD-10-CM | POA: Diagnosis not present

## 2013-08-05 DIAGNOSIS — I498 Other specified cardiac arrhythmias: Secondary | ICD-10-CM | POA: Diagnosis not present

## 2013-08-05 DIAGNOSIS — I10 Essential (primary) hypertension: Secondary | ICD-10-CM | POA: Diagnosis present

## 2013-08-05 DIAGNOSIS — K5900 Constipation, unspecified: Secondary | ICD-10-CM | POA: Diagnosis not present

## 2013-08-05 DIAGNOSIS — IMO0002 Reserved for concepts with insufficient information to code with codable children: Secondary | ICD-10-CM | POA: Diagnosis present

## 2013-08-05 DIAGNOSIS — D72829 Elevated white blood cell count, unspecified: Secondary | ICD-10-CM | POA: Insufficient documentation

## 2013-08-05 DIAGNOSIS — E860 Dehydration: Secondary | ICD-10-CM | POA: Diagnosis not present

## 2013-08-05 DIAGNOSIS — N179 Acute kidney failure, unspecified: Secondary | ICD-10-CM | POA: Diagnosis not present

## 2013-08-05 DIAGNOSIS — N058 Unspecified nephritic syndrome with other morphologic changes: Secondary | ICD-10-CM | POA: Insufficient documentation

## 2013-08-05 DIAGNOSIS — E118 Type 2 diabetes mellitus with unspecified complications: Secondary | ICD-10-CM

## 2013-08-05 DIAGNOSIS — R1011 Right upper quadrant pain: Secondary | ICD-10-CM | POA: Diagnosis not present

## 2013-08-05 DIAGNOSIS — K56609 Unspecified intestinal obstruction, unspecified as to partial versus complete obstruction: Secondary | ICD-10-CM | POA: Diagnosis not present

## 2013-08-05 LAB — CBC WITH DIFFERENTIAL/PLATELET
BASOS ABS: 0 10*3/uL (ref 0.0–0.1)
Basophils Relative: 0 % (ref 0–1)
EOS PCT: 1 % (ref 0–5)
Eosinophils Absolute: 0.1 10*3/uL (ref 0.0–0.7)
HEMATOCRIT: 42.8 % (ref 36.0–46.0)
Hemoglobin: 15.1 g/dL — ABNORMAL HIGH (ref 12.0–15.0)
LYMPHS ABS: 2.9 10*3/uL (ref 0.7–4.0)
LYMPHS PCT: 25 % (ref 12–46)
MCH: 27.9 pg (ref 26.0–34.0)
MCHC: 35.3 g/dL (ref 30.0–36.0)
MCV: 79.1 fL (ref 78.0–100.0)
MONOS PCT: 10 % (ref 3–12)
Monocytes Absolute: 1.2 10*3/uL — ABNORMAL HIGH (ref 0.1–1.0)
Neutro Abs: 7.4 10*3/uL (ref 1.7–7.7)
Neutrophils Relative %: 64 % (ref 43–77)
PLATELETS: 305 10*3/uL (ref 150–400)
RBC: 5.41 MIL/uL — AB (ref 3.87–5.11)
RDW: 13.1 % (ref 11.5–15.5)
WBC: 11.6 10*3/uL — AB (ref 4.0–10.5)

## 2013-08-05 LAB — URINALYSIS, ROUTINE W REFLEX MICROSCOPIC
Glucose, UA: >1000 mg/dL — AB
HGB URINE DIPSTICK: NEGATIVE
Ketones, ur: 15 mg/dL — AB
Nitrite: NEGATIVE
PH: 5 (ref 5.0–8.0)
Protein, ur: NEGATIVE mg/dL
Specific Gravity, Urine: 1.025 (ref 1.005–1.030)
UROBILINOGEN UA: 0.2 mg/dL (ref 0.0–1.0)

## 2013-08-05 LAB — COMPREHENSIVE METABOLIC PANEL
ALBUMIN: 4.4 g/dL (ref 3.5–5.2)
ALT: 15 U/L (ref 0–35)
ANION GAP: 22 — AB (ref 5–15)
AST: 15 U/L (ref 0–37)
Alkaline Phosphatase: 80 U/L (ref 39–117)
BUN: 36 mg/dL — AB (ref 6–23)
CALCIUM: 10.7 mg/dL — AB (ref 8.4–10.5)
CO2: 25 mEq/L (ref 19–32)
CREATININE: 3.15 mg/dL — AB (ref 0.50–1.10)
Chloride: 87 mEq/L — ABNORMAL LOW (ref 96–112)
GFR calc Af Amer: 15 mL/min — ABNORMAL LOW (ref >90)
GFR, EST NON AFRICAN AMERICAN: 13 mL/min — AB (ref >90)
Glucose, Bld: 334 mg/dL — ABNORMAL HIGH (ref 70–99)
Potassium: 3.9 mEq/L (ref 3.7–5.3)
Sodium: 134 mEq/L — ABNORMAL LOW (ref 137–147)
Total Bilirubin: 0.6 mg/dL (ref 0.3–1.2)
Total Protein: 8.4 g/dL — ABNORMAL HIGH (ref 6.0–8.3)

## 2013-08-05 LAB — URINE MICROSCOPIC-ADD ON

## 2013-08-05 LAB — GLUCOSE, CAPILLARY
GLUCOSE-CAPILLARY: 293 mg/dL — AB (ref 70–99)
Glucose-Capillary: 382 mg/dL — ABNORMAL HIGH (ref 70–99)

## 2013-08-05 LAB — LIPASE, BLOOD: Lipase: 37 U/L (ref 11–59)

## 2013-08-05 MED ORDER — SODIUM CHLORIDE 0.9 % IV SOLN
INTRAVENOUS | Status: DC
  Administered 2013-08-05 – 2013-08-06 (×2): 125 mL/h via INTRAVENOUS
  Administered 2013-08-06: 07:00:00 via INTRAVENOUS

## 2013-08-05 MED ORDER — VITAMIN E 45 MG (100 UNIT) PO CAPS
1000.0000 [IU] | ORAL_CAPSULE | Freq: Every day | ORAL | Status: DC
  Administered 2013-08-05 – 2013-08-07 (×3): 1000 [IU] via ORAL
  Filled 2013-08-05 (×3): qty 2

## 2013-08-05 MED ORDER — ASPIRIN EC 81 MG PO TBEC
81.0000 mg | DELAYED_RELEASE_TABLET | Freq: Every morning | ORAL | Status: DC
  Administered 2013-08-05 – 2013-08-07 (×3): 81 mg via ORAL
  Filled 2013-08-05 (×3): qty 1

## 2013-08-05 MED ORDER — HEPARIN SODIUM (PORCINE) 5000 UNIT/ML IJ SOLN
5000.0000 [IU] | Freq: Three times a day (TID) | INTRAMUSCULAR | Status: DC
  Administered 2013-08-05 – 2013-08-07 (×6): 5000 [IU] via SUBCUTANEOUS
  Filled 2013-08-05 (×8): qty 1

## 2013-08-05 MED ORDER — ONDANSETRON HCL 4 MG/2ML IJ SOLN: 4.0000 mg | Freq: Four times a day (QID) | INTRAMUSCULAR | Status: DC | PRN

## 2013-08-05 MED ORDER — INSULIN ASPART 100 UNIT/ML ~~LOC~~ SOLN
0.0000 [IU] | SUBCUTANEOUS | Status: DC
  Administered 2013-08-05: 5 [IU] via SUBCUTANEOUS

## 2013-08-05 MED ORDER — PANTOPRAZOLE SODIUM 40 MG PO TBEC
40.0000 mg | DELAYED_RELEASE_TABLET | Freq: Every day | ORAL | Status: DC
  Administered 2013-08-05 – 2013-08-07 (×3): 40 mg via ORAL
  Filled 2013-08-05 (×3): qty 1

## 2013-08-05 MED ORDER — ONDANSETRON HCL 4 MG PO TABS: 4.0000 mg | ORAL_TABLET | Freq: Four times a day (QID) | ORAL | Status: DC | PRN

## 2013-08-05 MED ORDER — SODIUM CHLORIDE 0.9 % IV BOLUS (SEPSIS)
750.0000 mL | Freq: Once | INTRAVENOUS | Status: AC
  Administered 2013-08-06: 750 mL via INTRAVENOUS

## 2013-08-05 NOTE — H&P (Addendum)
Hospitalist Admission History and Physical  Patient name: Allison [name] Medical record number: [id number] Date of birth: [birth year] Age: 79 y.o. Gender: female  Primary Care Provider: Benito Mccreedy, MD  Chief Complaint: abd pain, dehydration, AKI   History of Present Illness:This is a 79 y.o. year old female with significant past medical history of type 2 DM, HTN, GERD presenting with abd pain, dehydration, AKI. Pt states that she was recently started on invokana for improved diabetic control. Was started 4-5 days ago. Pt states that she took 2 tablets and since this point, has had persisent generalized abd pain, decreased po intake and malaise. Denies any fever or chills. States that she was in otherwise normal state of health prior to starting new medication. Denies any CP. Had some mild intermittent SOB that self resolved.  Presented to ER because of worsening sxs. Afebrile. Hemodynamically stable. WBC 11.6, Hgb 15, Cr 3.15, BUN 36, bicarb 25, Ca 10.7, Glu 334. UA pending. Lipase WNL. KUB w/ chest WNL. F/u RUQ abd u/s also WNL. No GB disease seen.   Assessment and Plan: Allison [name] is a 79 y.o. year old female presenting with abdominal pain, AKI, dehydration   Active Problems:   Abdominal pain, generalized   AKI (acute kidney injury)   Dehydration   Hyperglycemia   Hypercalcemia   Leukocytosis, unspecified   Abd pain: broad ddx for sxs including GB disease, pancreatitis, gastritis among others. In review of adverse SE w/ invokana, nausea, constipation, pancreatitis among common reactions. Suspect medication as predominant culprit in otherwise relatively healthy pt. Check lactate. Consider CT abd and pelvis if pain worsens. Hold offending agent. Fairly benign exam today. Hydrate. Continue to follow.   AKI: likely prerenal with concominant medication induced etiology. Renal impairment also on invokana adverse reaction profile. Hydrate pt. Avoid nephrotoxic agents. Renal u/s  if not improved w/in 24 hours.   DM: hyperglycemic today in setting of above. No metabolic acidosis. UA pending for ? Ketones. SSI. A1C. Hydrate. Continue to follow.   Leukocytosis: Suspect elevated WBC 2/2 dehydration. Majority of cell lines elevated/ULN. No overt signs of infection currently. Pan culture. Continue to follow.   HTN: Stable. Continue to follow. Holding thiazide in setting of elevated calcium and AKI.   Hypercalcemia: ? Dehydration and thiazide use. Hold offending agent. Hydrate. Ionized Ca, intact PTH, vit D.   FEN/GI: heart healthty carb modified diet.  Prophylaxis: sub heparin Disposition: pending further evaluatin Code Status: Full code   Patient Active Problem List   Diagnosis Date Noted  . Abdominal pain, generalized 08/05/2013  . AKI (acute kidney injury) 08/05/2013  . Dehydration 08/05/2013  . Hyperglycemia 08/05/2013  . Hypercalcemia 08/05/2013  . Pain in lower limb 04/18/2013  . Ingrown nail 01/10/2013  . Pain of toe 01/10/2013  . Pain in joint, ankle and foot 07/08/2012  . PVD (peripheral vascular disease) 03/05/2012  . Onychomycosis 03/05/2012  . Other hammer toe (acquired) 03/05/2012  . GERD (gastroesophageal reflux disease) 12/25/2011  . Abnormal CXR 12/25/2011  . HYPOTHYROIDISM 06/23/2006  . DM 06/23/2006  . DYSLIPIDEMIA 06/23/2006  . HYPERTENSION, ESSENTIAL NOS 06/23/2006  . GERD 06/23/2006  . IBS 06/23/2006   Past Medical History: Past Medical History  Diagnosis Date  . SOB (shortness of breath)   . Cough   . Dysphagia   . Hiatal hernia   . Diabetes mellitus without complication   . Hypertension     Past Surgical History: Past Surgical History  Procedure Laterality Date  . Vesicovaginal fistula  closure w/ tah  1976    Social History: History   Social History  . Marital Status: Single    Spouse Name: N/A    Number of Children: 2  . Years of Education: N/A   Occupational History  . Retired    Social History Main Topics   . Smoking status: Never Smoker   . Smokeless tobacco: Never Used  . Alcohol Use: No  . Drug Use: No  . Sexual Activity: No   Other Topics Concern  . None   Social History Narrative  . None    Family History: Family History  Problem Relation Age of Onset  . Asthma Sister   . Sleep apnea Sister   . Heart disease Sister   . Heart disease Father     Allergies: Allergies  Allergen Reactions  . Pineapple Anaphylaxis  . Codeine Other (See Comments)    unknown  . Invokana [Canagliflozin] Other (See Comments)    Constipation, nightmares  . Penicillins Other (See Comments)    Unknown   . Rofecoxib Other (See Comments)    unknown    Current Facility-Administered Medications  Medication Dose Route Frequency Provider Last Rate Last Dose  . 0.9 %  sodium chloride infusion   Intravenous Continuous Shanda Howells, MD      . Derrill Memo ON 08/06/2013] aspirin tablet 81 mg  81 mg Oral q morning - 10a Shanda Howells, MD      . heparin injection 5,000 Units  5,000 Units Subcutaneous 3 times per day Shanda Howells, MD      . insulin aspart (novoLOG) injection 0-9 Units  0-9 Units Subcutaneous 6 times per day Shanda Howells, MD      . ondansetron [facility]) tablet 4 mg  4 mg Oral Q6H PRN Shanda Howells, MD       Or  . ondansetron [facility]) injection 4 mg  4 mg Intravenous Q6H PRN Shanda Howells, MD      . pantoprazole (PROTONIX) EC tablet 40 mg  40 mg Oral Daily Shanda Howells, MD      . sodium chloride 0.9 % bolus 750 mL  750 mL Intravenous Once Shanda Howells, MD      . vitamin E capsule 1,000 Units  1,000 Units Oral Daily Shanda Howells, MD       Current Outpatient Prescriptions  Medication Sig Dispense Refill  . aspirin 81 MG tablet Take 81 mg by mouth every morning.       . bisacodyl (DULCOLAX) 5 MG EC tablet Take 5 mg by mouth daily as needed for moderate constipation.      . chlorthalidone (HYGROTON) 25 MG tablet Take 25 mg by mouth daily.       . cyclobenzaprine (FLEXERIL) 5 MG tablet Take 5  mg by mouth 3 (three) times daily as needed for muscle spasms.      . diphenhydrAMINE (BENADRYL) 25 MG tablet Take 25 mg by mouth daily as needed for allergies.      Marland Kitchen glyBURIDE-metformin (GLUCOVANCE) 5-500 MG per tablet Take 2 tablets by mouth 2 (two) times daily with a meal.        . HYDROcodone-acetaminophen (NORCO/VICODIN) 5-325 MG per tablet Take 1 tablet by mouth every 8 (eight) hours as needed for moderate pain.      Marland Kitchen levothyroxine (SYNTHROID, LEVOTHROID) 150 MCG tablet Take 150 mcg by mouth every morning.       Marland Kitchen omeprazole (PRILOSEC) 40 MG capsule Take 40 mg by mouth daily.      Marland Kitchen  RESTASIS 0.05 % ophthalmic emulsion Place 1 drop into both eyes 2 (two) times daily.       . vitamin E (VITAMIN E) 1000 UNIT capsule Take 1,000 Units by mouth daily.       Review Of Systems: 12 point ROS negative except as noted above in HPI.  Physical Exam: Filed Vitals:   08/05/13 1800  BP: 125/71  Pulse:   Temp:   Resp: 19    General: alert and cooperative HEENT: PERRLA and extra ocular movement intact Heart: S1, S2 normal, no murmur, rub or gallop, regular rate and rhythm Lungs: clear to auscultation, no wheezes or rales and unlabored breathing Abdomen: abdomen is soft without significant tenderness, masses, organomegaly or guarding Extremities: extremities normal, atraumatic, no cyanosis or edema Skin:no rashes, no ecchymoses Neurology: normal without focal findings  Labs and Imaging: Lab Results  Component Value Date/Time   NA 134* 08/05/2013  3:40 PM   K 3.9 08/05/2013  3:40 PM   CL 87* 08/05/2013  3:40 PM   CO2 25 08/05/2013  3:40 PM   BUN 36* 08/05/2013  3:40 PM   CREATININE 3.15* 08/05/2013  3:40 PM   GLUCOSE 334* 08/05/2013  3:40 PM   Lab Results  Component Value Date   WBC 11.6* 08/05/2013   HGB 15.1* 08/05/2013   HCT 42.8 08/05/2013   MCV 79.1 08/05/2013   PLT 305 08/05/2013    US Abdomen Complete  08/05/2013   CLINICAL DATA:  Right upper quadrant abdominal pain.  EXAM:  ULTRASOUND ABDOMEN COMPLETE  COMPARISON:  No priors.  FINDINGS: Gallbladder:  No gallstones or wall thickening visualized. No sonographic Murphy sign noted.  Common bile duct:  Diameter: 3.1 mm in the porta hepatis.  Liver:  No focal lesion identified. Within normal limits in parenchymal echogenicity.  IVC:  No abnormality visualized.  Pancreas:  Visualized portion unremarkable.  Spleen:  Size and appearance within normal limits.  6.1 cm in length.  Right Kidney:  Length: 12.2 cm. Echogenicity within normal limits. No mass or hydronephrosis visualized.  Left Kidney:  Length: 12.6 cm. Echogenicity within normal limits. No mass or hydronephrosis visualized.  Abdominal aorta:  No aneurysm visualized.  Other findings:  None.  IMPRESSION: 1. No acute findings to account for the patient's symptoms. 2. Specifically, no evidence of gallstones and no findings to suggest an acute cholecystitis at this time.   Electronically Signed   By: Vinnie Langton M.D.   On: 08/05/2013 17:30   Dg Abd Acute W/chest  08/05/2013   CLINICAL DATA:  79 year old female with abdominal pain, constipation, nausea and vomiting.  EXAM: ACUTE ABDOMEN SERIES (ABDOMEN 2 VIEW & CHEST 1 VIEW)  COMPARISON:  10/22/2012 and prior radiographs  FINDINGS: The cardiomediastinal silhouette is unremarkable.  The lungs are clear.  There is no evidence of airspace disease, pleural effusion or pneumothorax.  The bowel gas pattern is unremarkable.  There is no evidence of bowel obstruction or pneumoperitoneum.  No acute bony abnormalities are present.  No suspicious calcifications are identified.  IMPRESSION: No acute abnormalities.  Unremarkable bowel gas pattern.   Electronically Signed   By: Hassan Rowan M.D.   On: 08/05/2013 17:13           Shanda Howells MD  Pager: [phone]

## 2013-08-05 NOTE — ED Notes (Signed)
Pt in stating she a new medication, Invokana for her diabetes, and since that time she has been having trouble swallowing, constipation, abd pain and distention- states she doesn't have an appetite, also reports vomiting, pt thinks she is having a reaction to this new medication. No distress noted at this time.

## 2013-08-05 NOTE — ED Notes (Signed)
Pt remains in XR

## 2013-08-05 NOTE — Progress Notes (Signed)
Received report from Megan, RN

## 2013-08-05 NOTE — ED Provider Notes (Signed)
CSN: [id number]     Arrival date & time 08/05/13  1348 History   First MD Initiated Contact with Patient 08/05/13 1434     Chief Complaint  Patient presents with  . Medication Reaction  . Abdominal Pain      HPI Pt in stating she a new medication, Invokana for her diabetes, and since that time she has been having trouble swallowing, constipation, abd pain and distention- states she doesn't have an appetite, also reports vomiting, pt thinks she is having a reaction to this new medication. No distress noted at this time.  Past Medical History  Diagnosis Date  . SOB (shortness of breath)   . Cough   . Dysphagia   . Hiatal hernia   . Diabetes mellitus without complication   . Hypertension    Past Surgical History  Procedure Laterality Date  . Vesicovaginal fistula closure w/ tah  1976   Family History  Problem Relation Age of Onset  . Asthma Sister   . Sleep apnea Sister   . Heart disease Sister   . Heart disease Father    History  Substance Use Topics  . Smoking status: Never Smoker   . Smokeless tobacco: Never Used  . Alcohol Use: No   OB History   Grav Para Term Preterm Abortions TAB SAB Ect Mult Living                 Review of Systems  All other systems reviewed and are negative  Allergies  Pineapple; Codeine; Invokana; Penicillins; and Rofecoxib  Home Medications   Prior to Admission medications   Medication Sig Start Date End Date Taking? Authorizing Provider  aspirin 81 MG tablet Take 81 mg by mouth every morning.    Yes Historical Provider, MD  bisacodyl (DULCOLAX) 5 MG EC tablet Take 5 mg by mouth daily as needed for moderate constipation.   Yes Historical Provider, MD  chlorthalidone (HYGROTON) 25 MG tablet Take 25 mg by mouth daily.  10/10/12  Yes Historical Provider, MD  cyclobenzaprine (FLEXERIL) 5 MG tablet Take 5 mg by mouth 3 (three) times daily as needed for muscle spasms.   Yes Historical Provider, MD  diphenhydrAMINE (BENADRYL) 25 MG tablet  Take 25 mg by mouth daily as needed for allergies.   Yes Historical Provider, MD  glyBURIDE-metformin (GLUCOVANCE) 5-500 MG per tablet Take 2 tablets by mouth 2 (two) times daily with a meal.     Yes Historical Provider, MD  HYDROcodone-acetaminophen (NORCO/VICODIN) 5-325 MG per tablet Take 1 tablet by mouth every 8 (eight) hours as needed for moderate pain.   Yes Historical Provider, MD  levothyroxine (SYNTHROID, LEVOTHROID) 150 MCG tablet Take 150 mcg by mouth every morning.    Yes Historical Provider, MD  omeprazole (PRILOSEC) 40 MG capsule Take 40 mg by mouth daily.   Yes Historical Provider, MD  RESTASIS 0.05 % ophthalmic emulsion Place 1 drop into both eyes 2 (two) times daily.  01/09/13  Yes Historical Provider, MD  vitamin E (VITAMIN E) 1000 UNIT capsule Take 1,000 Units by mouth daily.   Yes Historical Provider, MD   BP 134/80  Pulse 98  Temp(Src) 97.7 F (36.5 C) (Oral)  Resp 16  Ht 5\' 4"  (1.626 m)  Wt 152 lb 9.6 oz (69.219 kg)  BMI 26.18 kg/m2  SpO2 98% Physical Exam Physical Exam  Nursing note and vitals reviewed. Constitutional: She is oriented to person, place, and time. She appears well-developed and well-nourished. No distress.  HENT:  Head: Normocephalic and atraumatic.  Eyes: Pupils are equal, round, and reactive to light.  Neck: Normal range of motion.  Cardiovascular: Normal rate and intact distal pulses.   Pulmonary/Chest: No respiratory distress.  Abdominal: Normal appearance. She exhibits no distension.  some mild tenderness to palpation right upper quadrant noted.  No masses.  No splenomegaly.  No rebound no guarding tenderness.  Bowel sounds active. Musculoskeletal: Normal range of motion.  Neurological: She is alert and oriented to person, place, and time. No cranial nerve deficit.  Skin: Skin is warm and dry. No rash noted.  Psychiatric: She has a normal mood and affect. Her behavior is normal.   ED Course  Procedures (including critical care time) Labs  Review Labs Reviewed  COMPREHENSIVE METABOLIC PANEL - Abnormal; Notable for the following:    Sodium 134 (*)    Chloride 87 (*)    Glucose, Bld 334 (*)    BUN 36 (*)    Creatinine, Ser 3.15 (*)    Calcium 10.7 (*)    Total Protein 8.4 (*)    GFR calc non Af Amer 13 (*)    GFR calc Af Amer 15 (*)    Anion gap 22 (*)    All other components within normal limits  CBC WITH DIFFERENTIAL - Abnormal; Notable for the following:    WBC 11.6 (*)    RBC 5.41 (*)    Hemoglobin 15.1 (*)    Monocytes Absolute 1.2 (*)    All other components within normal limits  COMPREHENSIVE METABOLIC PANEL - Abnormal; Notable for the following:    Glucose, Bld 100 (*)    BUN 35 (*)    Creatinine, Ser 2.20 (*)    GFR calc non Af Amer 21 (*)    GFR calc Af Amer 24 (*)    Anion gap 17 (*)    All other components within normal limits  CBC WITH DIFFERENTIAL - Abnormal; Notable for the following:    WBC 11.5 (*)    Lymphs Abs 4.1 (*)    Monocytes Absolute 1.2 (*)    All other components within normal limits  URINALYSIS, ROUTINE W REFLEX MICROSCOPIC - Abnormal; Notable for the following:    APPearance CLOUDY (*)    Glucose, UA >1000 (*)    Bilirubin Urine SMALL (*)    Ketones, ur 15 (*)    Leukocytes, UA MODERATE (*)    All other components within normal limits  URINE MICROSCOPIC-ADD ON - Abnormal; Notable for the following:    Squamous Epithelial / LPF FEW (*)    All other components within normal limits  GLUCOSE, CAPILLARY - Abnormal; Notable for the following:    Glucose-Capillary 382 (*)    All other components within normal limits  GLUCOSE, CAPILLARY - Abnormal; Notable for the following:    Glucose-Capillary 293 (*)    All other components within normal limits  GLUCOSE, CAPILLARY - Abnormal; Notable for the following:    Glucose-Capillary 113 (*)    All other components within normal limits  URINE CULTURE  CULTURE, BLOOD (ROUTINE X 2)  CULTURE, BLOOD (ROUTINE X 2)  LIPASE, BLOOD  GLUCOSE,  CAPILLARY  HEMOGLOBIN A1C  PTH, INTACT AND CALCIUM  SODIUM, URINE, RANDOM  CREATININE, URINE, RANDOM    Imaging Review US Abdomen Complete  08/05/2013   CLINICAL DATA:  Right upper quadrant abdominal pain.  EXAM: ULTRASOUND ABDOMEN COMPLETE  COMPARISON:  No priors.  FINDINGS: Gallbladder:  No gallstones or wall thickening visualized. No sonographic  Murphy sign noted.  Common bile duct:  Diameter: 3.1 mm in the porta hepatis.  Liver:  No focal lesion identified. Within normal limits in parenchymal echogenicity.  IVC:  No abnormality visualized.  Pancreas:  Visualized portion unremarkable.  Spleen:  Size and appearance within normal limits.  6.1 cm in length.  Right Kidney:  Length: 12.2 cm. Echogenicity within normal limits. No mass or hydronephrosis visualized.  Left Kidney:  Length: 12.6 cm. Echogenicity within normal limits. No mass or hydronephrosis visualized.  Abdominal aorta:  No aneurysm visualized.  Other findings:  None.  IMPRESSION: 1. No acute findings to account for the patient's symptoms. 2. Specifically, no evidence of gallstones and no findings to suggest an acute cholecystitis at this time.   Electronically Signed   By: Vinnie Langton M.D.   On: 08/05/2013 17:30   Dg Abd Acute W/chest  08/05/2013   CLINICAL DATA:  79 year old female with abdominal pain, constipation, nausea and vomiting.  EXAM: ACUTE ABDOMEN SERIES (ABDOMEN 2 VIEW & CHEST 1 VIEW)  COMPARISON:  10/22/2012 and prior radiographs  FINDINGS: The cardiomediastinal silhouette is unremarkable.  The lungs are clear.  There is no evidence of airspace disease, pleural effusion or pneumothorax.  The bowel gas pattern is unremarkable.  There is no evidence of bowel obstruction or pneumoperitoneum.  No acute bony abnormalities are present.  No suspicious calcifications are identified.  IMPRESSION: No acute abnormalities.  Unremarkable bowel gas pattern.   Electronically Signed   By: Hassan Rowan M.D.   On: 08/05/2013 17:13     EKG  Interpretation   Date/Time:  Saturday August 05 2013 14:05:40 EDT Ventricular Rate:  114 PR Interval:  148 QRS Duration: 62 QT Interval:  332 QTC Calculation: 457 R Axis:   -10 Text Interpretation:  Sinus tachycardia Possible Left atrial enlargement  Borderline ECG Confirmed by Aalani Aikens  MD, Juell Radney ([id number]) on 08/05/2013  4:05:16 PM      MDM   Final diagnoses:  Acute renal failure, unspecified acute renal failure type  Type 2 diabetes mellitus with complication        Dot Lanes, MD 08/06/13 1122

## 2013-08-06 ENCOUNTER — Encounter (HOSPITAL_COMMUNITY): Payer: Self-pay | Admitting: *Deleted

## 2013-08-06 DIAGNOSIS — E1165 Type 2 diabetes mellitus with hyperglycemia: Secondary | ICD-10-CM

## 2013-08-06 DIAGNOSIS — E1129 Type 2 diabetes mellitus with other diabetic kidney complication: Secondary | ICD-10-CM | POA: Diagnosis not present

## 2013-08-06 DIAGNOSIS — R1084 Generalized abdominal pain: Secondary | ICD-10-CM | POA: Diagnosis not present

## 2013-08-06 DIAGNOSIS — E860 Dehydration: Secondary | ICD-10-CM | POA: Diagnosis not present

## 2013-08-06 DIAGNOSIS — N179 Acute kidney failure, unspecified: Secondary | ICD-10-CM | POA: Diagnosis not present

## 2013-08-06 LAB — CBC WITH DIFFERENTIAL/PLATELET
BASOS PCT: 1 % (ref 0–1)
Basophils Absolute: 0.1 10*3/uL (ref 0.0–0.1)
EOS PCT: 3 % (ref 0–5)
Eosinophils Absolute: 0.3 10*3/uL (ref 0.0–0.7)
HEMATOCRIT: 38.9 % (ref 36.0–46.0)
HEMOGLOBIN: 13.2 g/dL (ref 12.0–15.0)
LYMPHS ABS: 4.1 10*3/uL — AB (ref 0.7–4.0)
Lymphocytes Relative: 36 % (ref 12–46)
MCH: 26.8 pg (ref 26.0–34.0)
MCHC: 33.9 g/dL (ref 30.0–36.0)
MCV: 79.1 fL (ref 78.0–100.0)
Monocytes Absolute: 1.2 10*3/uL — ABNORMAL HIGH (ref 0.1–1.0)
Monocytes Relative: 10 % (ref 3–12)
Neutro Abs: 5.8 10*3/uL (ref 1.7–7.7)
Neutrophils Relative %: 50 % (ref 43–77)
Platelets: 258 10*3/uL (ref 150–400)
RBC: 4.92 MIL/uL (ref 3.87–5.11)
RDW: 13.2 % (ref 11.5–15.5)
WBC: 11.5 10*3/uL — AB (ref 4.0–10.5)

## 2013-08-06 LAB — GLUCOSE, CAPILLARY
GLUCOSE-CAPILLARY: 135 mg/dL — AB (ref 70–99)
Glucose-Capillary: 113 mg/dL — ABNORMAL HIGH (ref 70–99)
Glucose-Capillary: 94 mg/dL (ref 70–99)
Glucose-Capillary: 161 mg/dL — ABNORMAL HIGH (ref 70–99)
Glucose-Capillary: 178 mg/dL — ABNORMAL HIGH (ref 70–99)

## 2013-08-06 LAB — COMPREHENSIVE METABOLIC PANEL
ALBUMIN: 3.6 g/dL (ref 3.5–5.2)
ALT: 11 U/L (ref 0–35)
AST: 13 U/L (ref 0–37)
Alkaline Phosphatase: 64 U/L (ref 39–117)
Anion gap: 17 — ABNORMAL HIGH (ref 5–15)
BUN: 35 mg/dL — ABNORMAL HIGH (ref 6–23)
CALCIUM: 9.3 mg/dL (ref 8.4–10.5)
CO2: 25 mEq/L (ref 19–32)
CREATININE: 2.2 mg/dL — AB (ref 0.50–1.10)
Chloride: 99 mEq/L (ref 96–112)
GFR calc Af Amer: 24 mL/min — ABNORMAL LOW (ref >90)
GFR calc non Af Amer: 21 mL/min — ABNORMAL LOW (ref >90)
Glucose, Bld: 100 mg/dL — ABNORMAL HIGH (ref 70–99)
Potassium: 3.7 mEq/L (ref 3.7–5.3)
SODIUM: 141 meq/L (ref 137–147)
TOTAL PROTEIN: 6.9 g/dL (ref 6.0–8.3)
Total Bilirubin: 0.7 mg/dL (ref 0.3–1.2)

## 2013-08-06 LAB — HEMOGLOBIN A1C
HEMOGLOBIN A1C: 10.8 % — AB (ref <5.7)
Mean Plasma Glucose: 263 mg/dL — ABNORMAL HIGH (ref <117)

## 2013-08-06 LAB — CREATININE, URINE, RANDOM: Creatinine, Urine: 126.64 mg/dL

## 2013-08-06 LAB — SODIUM, URINE, RANDOM: Sodium, Ur: 67 mEq/L

## 2013-08-06 MED ORDER — INSULIN ASPART 100 UNIT/ML ~~LOC~~ SOLN
0.0000 [IU] | Freq: Three times a day (TID) | SUBCUTANEOUS | Status: DC
  Administered 2013-08-06: 1 [IU] via SUBCUTANEOUS
  Administered 2013-08-06: 2 [IU] via SUBCUTANEOUS
  Administered 2013-08-07: 3 [IU] via SUBCUTANEOUS
  Administered 2013-08-07: 2 [IU] via SUBCUTANEOUS

## 2013-08-06 MED ORDER — SENNA 8.6 MG PO TABS
2.0000 | ORAL_TABLET | Freq: Every day | ORAL | Status: DC
  Administered 2013-08-06: 17.2 mg via ORAL
  Filled 2013-08-06 (×2): qty 2

## 2013-08-06 MED ORDER — INSULIN ASPART 100 UNIT/ML ~~LOC~~ SOLN: 0.0000 [IU] | Freq: Every day | SUBCUTANEOUS | Status: DC

## 2013-08-06 MED ORDER — ACETAMINOPHEN 325 MG PO TABS: 650.0000 mg | ORAL_TABLET | Freq: Four times a day (QID) | ORAL | Status: DC | PRN

## 2013-08-06 MED ORDER — POLYETHYLENE GLYCOL 3350 17 G PO PACK: 17.0000 g | PACK | Freq: Every day | ORAL | Status: DC

## 2013-08-06 MED ORDER — SODIUM CHLORIDE 0.9 % IV SOLN
INTRAVENOUS | Status: AC
  Administered 2013-08-06 – 2013-08-07 (×2): via INTRAVENOUS

## 2013-08-06 MED ORDER — BISACODYL 5 MG PO TBEC
10.0000 mg | DELAYED_RELEASE_TABLET | Freq: Once | ORAL | Status: AC
  Administered 2013-08-06: 10 mg via ORAL
  Filled 2013-08-06: qty 2

## 2013-08-06 MED ORDER — BISACODYL 10 MG RE SUPP: 10.0000 mg | Freq: Every day | RECTAL | Status: DC | PRN

## 2013-08-06 NOTE — Progress Notes (Signed)
Utilization review completed.  

## 2013-08-06 NOTE — Progress Notes (Signed)
2000 insulin not given. Orders acknowledged by ED RN prior to patient's arrival on unit. Unit RN did not see insulin orders. Resume q 4 h scheduled at 0000 dose.  Will continue to monitor patient.

## 2013-08-06 NOTE — Progress Notes (Signed)
Patient admitted to the unit at 2011. Patient is alert, verbal, and oriented x 4. Patient oriented to room, staff, and call bell. Skin is intact. Full assessment charted in [facility]. Call bell within reach. Visitor guidelines reviewed with patient and/or family.  Fall prevention discussed with patient.  Medications and valuables sent home with family.

## 2013-08-06 NOTE — Progress Notes (Signed)
PROGRESS NOTE    Allison [name] UEA:[id number] DOB: September 02, 1934 DOA: 08/05/2013 PCP: Benito Mccreedy, MD  HPI/Brief narrative 79 year old female patient with history of type II DM, HTN, GERD, recently started on Invokana for uncontrolled DM (A1c 11). According to patient, she took 2 tablets and since then has had generalized abdominal pain, poor oral intake and malaise. Denies fever or chills. Has constipation without p.m. for 4 days. Presented to ER because of worsening sxs. Afebrile. Hemodynamically stable. WBC 11.6, Hgb 15, Cr 3.15, BUN 36, bicarb 25, Ca 10.7, Glu 334. UA not suggestive of UTI.. Lipase WNL. KUB w/ chest WNL. F/u RUQ abd u/s also WNL. No GB disease seen.    Assessment/Plan:  1. Abdominal pain: Unclear etiology.? Secondary to Invokana. Abdominal x-ray, RUQ ultrasound, lipase and UA unremarkable. Constipation may also be contributing. Benign abdominal exam. Clinically improved. Monitor with diet. Bowel regimen. 2. Acute on chronic kidney disease: Baseline creatinine is unknown. Possibly from dehydration. Creatinine is improved from 3.15 > 2.2 with IV hydration. Continue IV fluids and follow BMP. Not on ACE inhibitors or ARB. May consider renal ultrasound if creatinine doesn't continue to improve. 3. Dehydration: IV fluids. 4. Uncontrolled type II DM with renal complications: Hold oral medications. Continue sliding scale insulin. Will need alternative agent as an outpatient versus insulin. 5. Hypertension: She controlled off medications currently. HCTZ held secondary to acute renal failure and hypercalcemia. 6. Mild hypercalcemia: Resolved  Code Status: Full Family Communication: None at bedside Disposition Plan: Home possibly 7/27.   Consultants:  None  Procedures:  None  Antibiotics:  None   Subjective: Feels much better. Abdominal pain minimal and intermittent. No N/V. Constipation- no BM in 4 days.  Objective: Filed Vitals:   08/05/13 1930 08/05/13  2014 08/05/13 2027 08/06/13 0439  BP: 135/71  134/80   Pulse: 92  98   Temp:   97.7 F (36.5 C)   TempSrc:   Oral   Resp: 15  16   Height:  5\' 4"  (1.626 m)    Weight:  67.087 kg (147 lb 14.4 oz)  69.219 kg (152 lb 9.6 oz)  SpO2: 99%  98%     Intake/Output Summary (Last 24 hours) at 08/06/13 1013 Last data filed at 08/06/13 4782  Gross per 24 hour  Intake 1243.75 ml  Output      0 ml  Net 1243.75 ml   Filed Weights   08/05/13 2014 08/06/13 0439  Weight: 67.087 kg (147 lb 14.4 oz) 69.219 kg (152 lb 9.6 oz)     Exam:  General exam: Pleasant elderly female lying comfortably in bed.  Respiratory system: Clear. No increased work of breathing. Cardiovascular system: S1 & S2 heard, RRR. No JVD, murmurs, gallops, clicks or pedal edema. Gastrointestinal system: Abdomen is nondistended, soft and nontender. Normal bowel sounds heard. Central nervous system: Alert and oriented. No focal neurological deficits. Extremities: Symmetric 5 x 5 power.   Data Reviewed: Basic Metabolic Panel:  Recent Labs Lab 08/05/13 1540 08/06/13 0449  NA 134* 141  K 3.9 3.7  CL 87* 99  CO2 25 25  GLUCOSE 334* 100*  BUN 36* 35*  CREATININE 3.15* 2.20*  CALCIUM 10.7* 9.3   Liver Function Tests:  Recent Labs Lab 08/05/13 1540 08/06/13 0449  AST 15 13  ALT 15 11  ALKPHOS 80 64  BILITOT 0.6 0.7  PROT 8.4* 6.9  ALBUMIN 4.4 3.6    Recent Labs Lab 08/05/13 1540  LIPASE 37   No results found  for this basename: AMMONIA,  in the last 168 hours CBC:  Recent Labs Lab 08/05/13 1540 08/06/13 0449  WBC 11.6* 11.5*  NEUTROABS 7.4 5.8  HGB 15.1* 13.2  HCT 42.8 38.9  MCV 79.1 79.1  PLT 305 258   Cardiac Enzymes: No results found for this basename: CKTOTAL, CKMB, CKMBINDEX, TROPONINI,  in the last 168 hours BNP (last 3 results) No results found for this basename: PROBNP,  in the last 8760 hours CBG:  Recent Labs Lab 08/05/13 2024 08/05/13 2329 08/06/13 0423 08/06/13 0801    GLUCAP 382* 293* 113* 94    No results found for this or any previous visit (from the past 240 hour(s)).      Studies: US Abdomen Complete  08/05/2013   CLINICAL DATA:  Right upper quadrant abdominal pain.  EXAM: ULTRASOUND ABDOMEN COMPLETE  COMPARISON:  No priors.  FINDINGS: Gallbladder:  No gallstones or wall thickening visualized. No sonographic Murphy sign noted.  Common bile duct:  Diameter: 3.1 mm in the porta hepatis.  Liver:  No focal lesion identified. Within normal limits in parenchymal echogenicity.  IVC:  No abnormality visualized.  Pancreas:  Visualized portion unremarkable.  Spleen:  Size and appearance within normal limits.  6.1 cm in length.  Right Kidney:  Length: 12.2 cm. Echogenicity within normal limits. No mass or hydronephrosis visualized.  Left Kidney:  Length: 12.6 cm. Echogenicity within normal limits. No mass or hydronephrosis visualized.  Abdominal aorta:  No aneurysm visualized.  Other findings:  None.  IMPRESSION: 1. No acute findings to account for the patient's symptoms. 2. Specifically, no evidence of gallstones and no findings to suggest an acute cholecystitis at this time.   Electronically Signed   By: Vinnie Langton M.D.   On: 08/05/2013 17:30   Dg Abd Acute W/chest  08/05/2013   CLINICAL DATA:  79 year old female with abdominal pain, constipation, nausea and vomiting.  EXAM: ACUTE ABDOMEN SERIES (ABDOMEN 2 VIEW & CHEST 1 VIEW)  COMPARISON:  10/22/2012 and prior radiographs  FINDINGS: The cardiomediastinal silhouette is unremarkable.  The lungs are clear.  There is no evidence of airspace disease, pleural effusion or pneumothorax.  The bowel gas pattern is unremarkable.  There is no evidence of bowel obstruction or pneumoperitoneum.  No acute bony abnormalities are present.  No suspicious calcifications are identified.  IMPRESSION: No acute abnormalities.  Unremarkable bowel gas pattern.   Electronically Signed   By: Hassan Rowan M.D.   On: 08/05/2013 17:13         Scheduled Meds: . aspirin EC  81 mg Oral q morning - 10a  . heparin  5,000 Units Subcutaneous 3 times per day  . insulin aspart  0-5 Units Subcutaneous QHS  . insulin aspart  0-9 Units Subcutaneous TID WC  . pantoprazole  40 mg Oral Daily  . senna  2 tablet Oral Daily  . vitamin E  1,000 Units Oral Daily   Continuous Infusions: . sodium chloride 100 mL/hr at 08/06/13 0737    Active Problems:   Abdominal pain, generalized   AKI (acute kidney injury)   Dehydration   Hyperglycemia   Hypercalcemia   Leukocytosis, unspecified    Time spent: 25 minutes.    Vernell Leep, MD, FACP, FHM. [facility] Pager [phone]  If 7PM-7AM, please contact night-coverage [email or web address] Password [email or web address] 08/06/2013, 10:13 AM    LOS: 1 day

## 2013-08-07 ENCOUNTER — Emergency Department (HOSPITAL_COMMUNITY)
Admission: EM | Admit: 2013-08-07 | Discharge: 2013-08-08 | Disposition: A | Discharge status: Home / Self Care | Payer: Medicare Other | Attending: Emergency Medicine | Admitting: Emergency Medicine

## 2013-08-07 ENCOUNTER — Encounter (HOSPITAL_COMMUNITY): Payer: Self-pay | Admitting: Emergency Medicine

## 2013-08-07 DIAGNOSIS — R1084 Generalized abdominal pain: Secondary | ICD-10-CM | POA: Diagnosis not present

## 2013-08-07 DIAGNOSIS — Z7982 Long term (current) use of aspirin: Secondary | ICD-10-CM | POA: Insufficient documentation

## 2013-08-07 DIAGNOSIS — IMO0002 Reserved for concepts with insufficient information to code with codable children: Secondary | ICD-10-CM | POA: Insufficient documentation

## 2013-08-07 DIAGNOSIS — E119 Type 2 diabetes mellitus without complications: Secondary | ICD-10-CM | POA: Insufficient documentation

## 2013-08-07 DIAGNOSIS — Y838 Other surgical procedures as the cause of abnormal reaction of the patient, or of later complication, without mention of misadventure at the time of the procedure: Secondary | ICD-10-CM | POA: Diagnosis not present

## 2013-08-07 DIAGNOSIS — Z79899 Other long term (current) drug therapy: Secondary | ICD-10-CM | POA: Insufficient documentation

## 2013-08-07 DIAGNOSIS — E1129 Type 2 diabetes mellitus with other diabetic kidney complication: Secondary | ICD-10-CM | POA: Diagnosis not present

## 2013-08-07 DIAGNOSIS — R58 Hemorrhage, not elsewhere classified: Secondary | ICD-10-CM

## 2013-08-07 DIAGNOSIS — E860 Dehydration: Secondary | ICD-10-CM | POA: Diagnosis not present

## 2013-08-07 DIAGNOSIS — N179 Acute kidney failure, unspecified: Secondary | ICD-10-CM | POA: Diagnosis not present

## 2013-08-07 DIAGNOSIS — I10 Essential (primary) hypertension: Secondary | ICD-10-CM | POA: Diagnosis not present

## 2013-08-07 DIAGNOSIS — Z880 Allergy status to penicillin: Secondary | ICD-10-CM | POA: Diagnosis not present

## 2013-08-07 DIAGNOSIS — Z8719 Personal history of other diseases of the digestive system: Secondary | ICD-10-CM | POA: Insufficient documentation

## 2013-08-07 DIAGNOSIS — E1165 Type 2 diabetes mellitus with hyperglycemia: Secondary | ICD-10-CM | POA: Diagnosis not present

## 2013-08-07 LAB — URINE CULTURE: Colony Count: >=100000

## 2013-08-07 LAB — PTH, INTACT AND CALCIUM
Calcium, Total (PTH): 10.7 mg/dL — ABNORMAL HIGH (ref 8.4–10.5)
PTH: 71.8 pg/mL (ref 14.0–72.0)

## 2013-08-07 LAB — CBC
HCT: 35.5 % — ABNORMAL LOW (ref 36.0–46.0)
HEMOGLOBIN: 12.1 g/dL (ref 12.0–15.0)
MCH: 26.9 pg (ref 26.0–34.0)
MCHC: 34.1 g/dL (ref 30.0–36.0)
MCV: 79.1 fL (ref 78.0–100.0)
Platelets: 218 10*3/uL (ref 150–400)
RBC: 4.49 MIL/uL (ref 3.87–5.11)
RDW: 13.3 % (ref 11.5–15.5)
WBC: 6.4 10*3/uL (ref 4.0–10.5)

## 2013-08-07 LAB — BASIC METABOLIC PANEL
ANION GAP: 13 (ref 5–15)
BUN: 18 mg/dL (ref 6–23)
CALCIUM: 9 mg/dL (ref 8.4–10.5)
CO2: 24 meq/L (ref 19–32)
Chloride: 103 mEq/L (ref 96–112)
Creatinine, Ser: 0.94 mg/dL (ref 0.50–1.10)
GFR, EST AFRICAN AMERICAN: 67 mL/min — AB (ref >90)
GFR, EST NON AFRICAN AMERICAN: 57 mL/min — AB (ref >90)
Glucose, Bld: 159 mg/dL — ABNORMAL HIGH (ref 70–99)
Potassium: 3.7 mEq/L (ref 3.7–5.3)
SODIUM: 140 meq/L (ref 137–147)

## 2013-08-07 LAB — GLUCOSE, CAPILLARY
Glucose-Capillary: 158 mg/dL — ABNORMAL HIGH (ref 70–99)
Glucose-Capillary: 203 mg/dL — ABNORMAL HIGH (ref 70–99)

## 2013-08-07 MED ORDER — MILK AND MOLASSES ENEMA
1.0000 | Freq: Once | RECTAL | Status: DC
  Filled 2013-08-07: qty 250

## 2013-08-07 NOTE — ED Notes (Signed)
D/c'd today from hospital. Was given a heparin injection in LLQ. C/o still bleeding at the sight. Scant blood noted, minimal bruising noted. Pt alert, NAD, calm, steady gait.

## 2013-08-07 NOTE — Discharge Summary (Addendum)
Physician Discharge Summary  MARRIANNE SICA LYY:[id number] DOB: 12-26-1934 DOA: 08/05/2013  PCP: Benito Mccreedy, MD  Admit date: 08/05/2013 Discharge date: 08/07/2013  Time spent: Less than 30 minutes  Recommendations for Outpatient Follow-up:  1. Dr. Benito Mccreedy, PCP in 1 week with repeat labs (BMP). Follow final blood culture results- sent from hospital.  Discharge Diagnoses:  Active Problems:   HYPERTENSION, ESSENTIAL NOS   Abdominal pain, generalized   AKI (acute kidney injury)   Dehydration   DM type 2, uncontrolled, with renal complications   Hypercalcemia   Leukocytosis, unspecified   Discharge Condition: Improved & Stable  Diet recommendation: Heart Healthy & Diabetic diet.  Filed Weights   08/05/13 2014 08/06/13 0439 08/07/13 0539  Weight: 67.087 kg (147 lb 14.4 oz) 69.219 kg (152 lb 9.6 oz) 70.58 kg (155 lb 9.6 oz)    History of present illness:  79 year old female patient with history of type II DM, HTN, GERD, recently started on Invokana for uncontrolled DM (A1c 11). According to patient, she took 2 tablets and since then has had generalized abdominal pain, poor oral intake and malaise. Denies fever or chills. Has constipation without p.m. for 4 days.  Presented to ER because of worsening sxs. Afebrile. Hemodynamically stable. WBC 11.6, Hgb 15, Cr 3.15, BUN 36, bicarb 25, Ca 10.7, Glu 334. UA not suggestive of UTI.. Lipase WNL. KUB w/ chest WNL. F/u RUQ abd u/s also WNL. No GB disease seen.    Hospital Course:  1. Abdominal pain: Unclear etiology.? Secondary to Invokana. Abdominal x-ray, RUQ ultrasound, lipase and UA unremarkable. Constipation may also be contributing. Benign abdominal exam. Resolved.  2. Acute on chronic kidney disease: Baseline creatinine is unknown. Possibly from dehydration. Not on ACE inhibitors or ARB. Resolved after IV hydration. 3. Dehydration: IV fluids. 4. Uncontrolled type II DM with renal complications: Held oral medications in  patient. Treated with sliding scale insulin. Invokana DC'ed. Continue other PTA PO meds. FU with PCP re further Mx. 5. Hypertension: diuretics were held- resume at DC. 6. Mild hypercalcemia: Resolved. Likely related to dehydration.   Consultations:  None  Procedures:  None    Discharge Exam:  Complaints:  2 large BM's this am. Denies abdominal pain, nausea or vomiting. Tolerating diet.  Filed Vitals:   08/06/13 1300 08/06/13 1417 08/06/13 2043 08/07/13 0539  BP:  99/61 139/80 134/78  Pulse:  84 89 78  Temp: 97.7 F (36.5 C) 97.7 F (36.5 C) 97.7 F (36.5 C) 97.8 F (36.6 C)  TempSrc:  Oral Oral Oral  Resp:  17 18 20   Height:      Weight:    70.58 kg (155 lb 9.6 oz)  SpO2:  100% 98% 98%    General exam: Pleasant elderly female lying comfortably in bed.  Respiratory system: Clear. No increased work of breathing.  Cardiovascular system: S1 & S2 heard, RRR. No JVD, murmurs, gallops, clicks or pedal edema.  Gastrointestinal system: Abdomen is nondistended, soft and nontender. Normal bowel sounds heard.  Central nervous system: Alert and oriented. No focal neurological deficits.  Extremities: Symmetric 5 x 5 power.   Discharge Instructions      Discharge Instructions   Call MD for:  persistant nausea and vomiting    Complete by:  As directed      Call MD for:  severe uncontrolled pain    Complete by:  As directed      Diet - low sodium heart healthy    Complete by:  As directed  Diet Carb Modified    Complete by:  As directed      Increase activity slowly    Complete by:  As directed             Medication List         aspirin 81 MG tablet  Take 81 mg by mouth every morning.     bisacodyl 5 MG EC tablet  Commonly known as:  DULCOLAX  Take 5 mg by mouth daily as needed for moderate constipation.     chlorthalidone 25 MG tablet  Commonly known as:  HYGROTON  Take 25 mg by mouth daily.     cyclobenzaprine 5 MG tablet  Commonly known as:   FLEXERIL  Take 5 mg by mouth 3 (three) times daily as needed for muscle spasms.     diphenhydrAMINE 25 MG tablet  Commonly known as:  BENADRYL  Take 25 mg by mouth daily as needed for allergies.     glyBURIDE-metformin 5-500 MG per tablet  Commonly known as:  GLUCOVANCE  Take 2 tablets by mouth 2 (two) times daily with a meal.     HYDROcodone-acetaminophen 5-325 MG per tablet  Commonly known as:  NORCO/VICODIN  Take 1 tablet by mouth every 8 (eight) hours as needed for moderate pain.     levothyroxine 150 MCG tablet  Commonly known as:  SYNTHROID, LEVOTHROID  Take 150 mcg by mouth every morning.     omeprazole 40 MG capsule  Commonly known as:  PRILOSEC  Take 40 mg by mouth daily.     RESTASIS 0.05 % ophthalmic emulsion  Generic drug:  cycloSPORINE  Place 1 drop into both eyes 2 (two) times daily.     vitamin E 1000 UNIT capsule  Generic drug:  vitamin E  Take 1,000 Units by mouth daily.       Follow-up Information   Follow up with OSEI-BONSU,GEORGE, MD. Schedule an appointment as soon as possible for a visit in 1 week. (To be seen with repeat labs (BMP).)    Specialty:  Internal Medicine   Contact information:   [address] [place] [address] [phone]        The results of significant diagnostics from this hospitalization (including imaging, microbiology, ancillary and laboratory) are listed below for reference.    Significant Diagnostic Studies: US Abdomen Complete  08/05/2013   CLINICAL DATA:  Right upper quadrant abdominal pain.  EXAM: ULTRASOUND ABDOMEN COMPLETE  COMPARISON:  No priors.  FINDINGS: Gallbladder:  No gallstones or wall thickening visualized. No sonographic Murphy sign noted.  Common bile duct:  Diameter: 3.1 mm in the porta hepatis.  Liver:  No focal lesion identified. Within normal limits in parenchymal echogenicity.  IVC:  No abnormality visualized.  Pancreas:  Visualized portion unremarkable.  Spleen:  Size and appearance  within normal limits.  6.1 cm in length.  Right Kidney:  Length: 12.2 cm. Echogenicity within normal limits. No mass or hydronephrosis visualized.  Left Kidney:  Length: 12.6 cm. Echogenicity within normal limits. No mass or hydronephrosis visualized.  Abdominal aorta:  No aneurysm visualized.  Other findings:  None.  IMPRESSION: 1. No acute findings to account for the patient's symptoms. 2. Specifically, no evidence of gallstones and no findings to suggest an acute cholecystitis at this time.   Electronically Signed   By: Vinnie Langton M.D.   On: 08/05/2013 17:30   Dg Abd Acute W/chest  08/05/2013   CLINICAL DATA:  79 year old female with  abdominal pain, constipation, nausea and vomiting.  EXAM: ACUTE ABDOMEN SERIES (ABDOMEN 2 VIEW & CHEST 1 VIEW)  COMPARISON:  10/22/2012 and prior radiographs  FINDINGS: The cardiomediastinal silhouette is unremarkable.  The lungs are clear.  There is no evidence of airspace disease, pleural effusion or pneumothorax.  The bowel gas pattern is unremarkable.  There is no evidence of bowel obstruction or pneumoperitoneum.  No acute bony abnormalities are present.  No suspicious calcifications are identified.  IMPRESSION: No acute abnormalities.  Unremarkable bowel gas pattern.   Electronically Signed   By: Hassan Rowan M.D.   On: 08/05/2013 17:13    Microbiology: Recent Results (from the past 240 hour(s))  URINE CULTURE     Status: None   Collection Time    08/05/13  7:20 PM      Result Value Ref Range Status   Specimen Description URINE, CLEAN CATCH   Final   Special Requests NONE   Final   Culture  Setup Time     Final   Value: 08/06/2013 15:52     Performed at [facility]     Final   Value: >=100,000 COLONIES/ML     Performed at [facility]   Culture     Final   Value: Multiple bacterial morphotypes present, none predominant. Suggest appropriate recollection if clinically indicated.     Performed at [facility]    Report Status 08/07/2013 FINAL   Final  CULTURE, BLOOD (ROUTINE X 2)     Status: None   Collection Time    08/05/13  8:53 PM      Result Value Ref Range Status   Specimen Description BLOOD RIGHT ARM   Final   Special Requests     Final   Value: BOTTLES DRAWN AEROBIC AND ANAEROBIC 10CC BLUE,5CC RED   Culture  Setup Time     Final   Value: 08/06/2013 14:49     Performed at [facility]   Culture     Final   Value:        BLOOD CULTURE RECEIVED NO GROWTH TO DATE CULTURE WILL BE HELD FOR 5 DAYS BEFORE ISSUING A FINAL NEGATIVE REPORT     Performed at [facility]   Report Status PENDING   Incomplete  CULTURE, BLOOD (ROUTINE X 2)     Status: None   Collection Time    08/05/13  9:07 PM      Result Value Ref Range Status   Specimen Description BLOOD RIGHT FOREARM   Final   Special Requests     Final   Value: BOTTLES DRAWN AEROBIC AND ANAEROBIC 10CC BLUE,5CC RED   Culture  Setup Time     Final   Value: 08/06/2013 14:49     Performed at [facility]   Culture     Final   Value:        BLOOD CULTURE RECEIVED NO GROWTH TO DATE CULTURE WILL BE HELD FOR 5 DAYS BEFORE ISSUING A FINAL NEGATIVE REPORT     Performed at [facility]   Report Status PENDING   Incomplete     Labs: Basic Metabolic Panel:  Recent Labs Lab 08/05/13 1540 08/05/13 2053 08/06/13 0449 08/07/13 0750  NA 134*  --  141 140  K 3.9  --  3.7 3.7  CL 87*  --  99 103  CO2 25  --  25 24  GLUCOSE 334*  --  100* 159*  BUN 36*  --  35* 18  CREATININE 3.15*  --  2.20* 0.94  CALCIUM 10.7* 10.7* 9.3 9.0   Liver Function Tests:  Recent Labs Lab 08/05/13 1540 08/06/13 0449  AST 15 13  ALT 15 11  ALKPHOS 80 64  BILITOT 0.6 0.7  PROT 8.4* 6.9  ALBUMIN 4.4 3.6    Recent Labs Lab 08/05/13 1540  LIPASE 37   No results found for this basename: AMMONIA,  in the last 168 hours CBC:  Recent Labs Lab 08/05/13 1540 08/06/13 0449 08/07/13 0626  WBC 11.6* 11.5* 6.4  NEUTROABS 7.4  5.8  --   HGB 15.1* 13.2 12.1  HCT 42.8 38.9 35.5*  MCV 79.1 79.1 79.1  PLT 305 258 218   Cardiac Enzymes: No results found for this basename: CKTOTAL, CKMB, CKMBINDEX, TROPONINI,  in the last 168 hours BNP: BNP (last 3 results) No results found for this basename: PROBNP,  in the last 8760 hours CBG:  Recent Labs Lab 08/06/13 1155 08/06/13 1742 08/06/13 2125 08/07/13 0755 08/07/13 1219  GLUCAP 161* 135* 178* 158* 203*    Additional labs: 1. A1C: 10.8 2. PTH 71.8   Signed:  Vernell Leep, MD, FACP, FHM. [facility] Pager [phone]  If 7PM-7AM, please contact night-coverage [email or web address] Password [email or web address] 08/07/2013, 1:46 PM

## 2013-08-07 NOTE — Progress Notes (Signed)
Utilization review completed.  

## 2013-08-07 NOTE — Progress Notes (Signed)
Patient discharge teaching given, including activity, diet, follow-up appoints, and medications. Patient verbalized understanding of all discharge instructions. IV access was d/c'd. Vitals are stable. Skin is intact except as charted in most recent assessments. Pt to be escorted out by RN, to be driven home by family.  

## 2013-08-07 NOTE — Progress Notes (Signed)
PT Cancellation Note  Patient Details Name: Allison [name] MRN: [id number] DOB: 1934/11/18   Cancelled Treatment:    Reason Eval/Treat Not Completed: PT screened, no needs identified, will sign off (pt moving in room independently, walking and performing ADLs and denies any deficits or needs at present.)   Melford Aase 08/07/2013, 11:07 AM Elwyn Reach, [place]

## 2013-08-08 NOTE — ED Provider Notes (Signed)
Medical screening examination/treatment/procedure(s) were performed by non-physician practitioner and as supervising physician I was immediately available for consultation/collaboration.   EKG Interpretation None       Kalman Drape, MD 08/08/13 [phone]

## 2013-08-08 NOTE — ED Provider Notes (Signed)
CSN: [id number]     Arrival date & time 08/07/13  2351 History   First MD Initiated Contact with Patient 08/08/13 0105     Chief Complaint  Patient presents with  . Wound Check     (Consider location/radiation/quality/duration/timing/severity/associated sxs/prior Treatment) Patient is a 79 y.o. female presenting with wound check. The history is provided by the patient and medical records. No language interpreter was used.  Wound Check Pertinent negatives include no abdominal pain, chest pain, coughing, diaphoresis, fatigue, fever, headaches, nausea, rash or vomiting.    Allison [name] is a 79 y.o. female  with a hx of niddm, HTN, sciatica presents to the Emergency Department complaining of gradual, persistent, bleeding from the site of her heparin injection onset this after.  Record review shows that pt was admitted this weekend for AKI and she reports she was given a heparin around 3pm today before discharge at 5pm.  She reports that it has been bleeding since she got home.  Nothing makes it better and nothing makes it worse.  Pt denies fever, chills, headache, neck pain, chest pain, SOB, abd pain, N/V/D, weakness, dizziness, syncope, dysuria, hematuria.     Past Medical History  Diagnosis Date  . SOB (shortness of breath)   . Cough   . Dysphagia   . Hiatal hernia   . Diabetes mellitus without complication   . Hypertension    Past Surgical History  Procedure Laterality Date  . Vesicovaginal fistula closure w/ tah  1976   Family History  Problem Relation Age of Onset  . Asthma Sister   . Sleep apnea Sister   . Heart disease Sister   . Heart disease Father    History  Substance Use Topics  . Smoking status: Never Smoker   . Smokeless tobacco: Never Used  . Alcohol Use: No   OB History   Grav Para Term Preterm Abortions TAB SAB Ect Mult Living                 Review of Systems  Constitutional: Negative for fever, diaphoresis, appetite change, fatigue and unexpected  weight change.  HENT: Negative for mouth sores.   Eyes: Negative for visual disturbance.  Respiratory: Negative for cough, chest tightness, shortness of breath and wheezing.   Cardiovascular: Negative for chest pain.  Gastrointestinal: Negative for nausea, vomiting, abdominal pain, diarrhea and constipation.  Endocrine: Negative for polydipsia, polyphagia and polyuria.  Genitourinary: Negative for dysuria, urgency, frequency and hematuria.  Musculoskeletal: Negative for back pain and neck stiffness.  Skin: Negative for rash.       Bleeding from heparin injection site  Allergic/Immunologic: Negative for immunocompromised state.  Neurological: Negative for syncope, light-headedness and headaches.  Hematological: Does not bruise/bleed easily.  Psychiatric/Behavioral: Negative for sleep disturbance. The patient is not nervous/anxious.       Allergies  Pineapple; Codeine; Invokana; Penicillins; and Rofecoxib  Home Medications   Prior to Admission medications   Medication Sig Start Date End Date Taking? Authorizing Provider  aspirin 81 MG tablet Take 81 mg by mouth every morning.    Yes Historical Provider, MD  chlorthalidone (HYGROTON) 25 MG tablet Take 25 mg by mouth daily.  10/10/12  Yes Historical Provider, MD  glyBURIDE-metformin (GLUCOVANCE) 5-500 MG per tablet Take 2 tablets by mouth 2 (two) times daily with a meal.     Yes Historical Provider, MD  levothyroxine (SYNTHROID, LEVOTHROID) 150 MCG tablet Take 150 mcg by mouth every morning.    Yes Historical Provider, MD  omeprazole (PRILOSEC) 40 MG capsule Take 40 mg by mouth daily.   Yes Historical Provider, MD  vitamin E (VITAMIN E) 1000 UNIT capsule Take 1,000 Units by mouth daily.   Yes Historical Provider, MD  bisacodyl (DULCOLAX) 5 MG EC tablet Take 5 mg by mouth daily as needed for moderate constipation.    Historical Provider, MD  cyclobenzaprine (FLEXERIL) 5 MG tablet Take 5 mg by mouth 3 (three) times daily as needed for  muscle spasms.    Historical Provider, MD  diphenhydrAMINE (BENADRYL) 25 MG tablet Take 25 mg by mouth daily as needed for allergies.    Historical Provider, MD  HYDROcodone-acetaminophen (NORCO/VICODIN) 5-325 MG per tablet Take 1 tablet by mouth every 8 (eight) hours as needed for moderate pain.    Historical Provider, MD  RESTASIS 0.05 % ophthalmic emulsion Place 1 drop into both eyes 2 (two) times daily.  01/09/13   Historical Provider, MD   BP 157/82  Pulse 97  Temp(Src) 98.3 F (36.8 C) (Oral)  Resp 16  Wt 155 lb (70.308 kg)  SpO2 100% Physical Exam  Nursing note and vitals reviewed. Constitutional: She appears well-developed and well-nourished. No distress.  Awake, alert, nontoxic appearance  HENT:  Head: Normocephalic and atraumatic.  Mouth/Throat: Oropharynx is clear and moist. No oropharyngeal exudate.  Eyes: Conjunctivae are normal. No scleral icterus.  Neck: Normal range of motion. Neck supple.  Cardiovascular: Normal rate, regular rhythm, normal heart sounds and intact distal pulses.   No murmur heard. Pulmonary/Chest: Effort normal and breath sounds normal. No respiratory distress. She has no wheezes.  Abdominal: Soft. Bowel sounds are normal. She exhibits no mass. There is no tenderness. There is no rebound and no guarding.  Musculoskeletal: Normal range of motion. She exhibits no edema.  Neurological: She is alert.  Speech is clear and goal oriented Moves extremities without ataxia  Skin: Skin is warm and dry. She is not diaphoretic. No erythema.  Quarter sized hematoma on the skin in the RLQ of the abd with bleeding from the injection site - punctate lesion with bleeding.  Psychiatric: She has a normal mood and affect.    ED Course  LACERATION REPAIR Date/Time: 08/08/2013 1:49 AM Performed by: Abigail Butts Authorized by: Abigail Butts Consent: Verbal consent obtained. Risks and benefits: risks, benefits and alternatives were discussed Consent  given by: patient Patient understanding: patient states understanding of the procedure being performed Patient consent: the patient's understanding of the procedure matches consent given Procedure consent: procedure consent matches procedure scheduled Relevant documents: relevant documents present and verified Site marked: the operative site was marked Required items: required blood products, implants, devices, and special equipment available Patient identity confirmed: verbally with patient and arm band Time out: Immediately prior to procedure a "time out" was called to verify the correct patient, procedure, equipment, support staff and site/side marked as required. Body area: trunk Location details: abdomen Wound length (cm): punctate. Tendon involvement: none Nerve involvement: none Vascular damage: no Anesthesia: local infiltration Local anesthetic: lidocaine 2% with epinephrine Anesthetic total: 2 ml Patient sedated: no Skin closure: glue Comments: Punctate hole noted to the RLQ where SQ heparin injection was give; persistent blood leakage from the site.  Bleeding controlled with Lidocaine with Epi and sealed with dermabond.  Pt tolerated procedure without complication and with hemostasis.     (including critical care time) Labs Review Labs Reviewed - No data to display  Imaging Review No results found.   EKG Interpretation None  MDM   Final diagnoses:  Bleeding   Helmut Muster presents with persistently bleeding SQ site of heparin injection earlier today.  Bleeding controlled with with Lidocaine with Epi injection and sealed with dermabond.  Pt tolerated procedure without complication and with hemostasis.  Record review shows that pt was only receiving heparin in the hospital and will not be taking it at home.    She is to return to the ED for persistent bleeding and should follow-up with her PCP in 3 days.  BP 157/82  Pulse 97  Temp(Src) 98.3 F (36.8 C)  (Oral)  Resp 16  Wt 155 lb (70.308 kg)  SpO2 100%   Abigail Butts, PA-C 08/08/13 0210

## 2013-08-08 NOTE — Discharge Instructions (Signed)
1. Medications: usual home medications 2. Treatment: rest, drink plenty of fluids,  3. Follow Up: Please followup with your primary doctor for discussion of your diagnoses and further evaluation after today's visit; if you do not have a primary care doctor use the resource guide provided to find one;    Anticoagulation, Generic Anticoagulants are medicines used to prevent clots from developing in your veins. These medicine are also known as blood thinners. If blood clots are untreated, they could travel to your lungs. This is called a pulmonary embolus. A blood clot in your lungs can be fatal.  Health care providers often use anticoagulants to prevent clots following surgery. Anticoagulants are also used along with aspirin when the heart is not getting enough blood. Another anticoagulant called warfarin is started 2 to 3 days after a rapid-acting injectable anticoagulant is started. The rapid-acting anticoagulants are usually continued until warfarin has begun to work. Your health care provider will judge this length of time by blood tests known as the prothrombin time (PT) and International Normalization Ratio (INR). This means that your blood is at the necessary and best level to prevent clots. RISKS AND COMPLICATIONS  If you have received recent epidural anesthesia, spinal anesthesia, or a spinal tap while receiving anticoagulants, you are at risk for developing a blood clot in or around the spine. This condition could result in long-term or permanent paralysis.  Because anticoagulants thin your blood, severe bleeding may occur from any tissue or organ. Symptoms of the blood being too thin may include:  Bleeding from the nose or gums that does not stop quickly.  Blood in bowel movements which may appear as bright red, dark, or black tarry stools.  Blood in the urine which may appear as pink, red, or brown urine.  Unusual bruising or bruising easily.  A cut that does not stop bleeding within  10 minutes.  Vomiting blood or continuous nausea for more than 1 day.  Coughing up blood.  Broken blood vessels in your eye (subconjunctival hemorrhage).  Abdominal or back pain with or without flank bruising.  Sudden, severe headache.  Sudden weakness or numbness of the face, arm, or leg, especially on one side of the body.  Sudden confusion.  Trouble speaking (aphasia) or understanding.  Sudden trouble seeing in one or both eyes.  Sudden trouble walking.  Dizziness.  Loss of balance or coordination.  Vaginal bleeding.  Swelling or pain at an injection site.  Superficial fat tissue death (necrosis) which may cause skin scarring. This is more common in women and may first present as pain in the waist, thighs, or buttocks.  Fever.  Too little anticoagulation continues to allow the risk for blood clots. HOME CARE INSTRUCTIONS   Due to the complications of anticoagulants, it is very important that you take your anticoagulant as directed by your health care provider. Anticoagulants need to be taken exactly as instructed. Be sure you understand all your anticoagulant instructions.  Keep all follow-up appointments with your health care provider as directed. It is very important to keep your appointments. Not keeping appointments could result in a chronic or permanent injury, pain, or disability.  Warfarin. Your health care provider will advise you on the length of treatment (usually 3-6 months, sometimes lifelong).  Take warfarin exactly as directed by your health care provider. It is recommended that you take your warfarin dose at the same time of the day. It is preferred that you take warfarin in the late afternoon. If you have  been told to stop taking warfarin, do not resume taking warfarin until directed to do so by your health care provider. Follow your health care provider's instructions if you accidentally take an extra dose or miss a dose of warfarin. It is very important  to take warfarin as directed since bleeding or blood clots could result in chronic or permanent injury, pain, or disability.  Too much and too little warfarin are both dangerous. Too much warfarin increases the risk of bleeding. Too little warfarin continues to allow the risk for blood clots. While taking warfarin, you will need to have regular blood tests to measure your blood clotting time. These blood tests usually include both the prothrombin time (PT) and International Normalized Ratio (INR) tests. The PT and INR results allow your health care provider to adjust your dose of warfarin. The dose can change for many reasons. It is critically important that you have your PT and INR levels drawn exactly as directed. Your warfarin dose may stay the same or change depending on what the PT and INR results are. Be sure to follow up with your health care provider regarding your PT and INR test results and what your warfarin dosage should be.  Many medicines can interfere with warfarin and affect the PT and INR results. You must tell your health care provider about any and all medicines you take, this includes all vitamins and supplements. Ask your health care provider before taking these. Prescription and over-the-counter medicine consistency is critical to warfarin management. It is important that potential interactions are checked before you start a new medicine. Be especially cautious with aspirin and anti-inflammatory medicines. Ask your health care provider before taking these. Medicines such as antibiotics and acid-reducing medicine can interact with warfarin and can cause an increased warfarin effect. Warfarin can also interfere with the effectiveness of medicines you are taking. Do not take or discontinue any prescribed or over-the-counter medicine except on the advice of your health care provider or pharmacist.  Some vitamins, supplements, and herbal products interfere with the effectiveness of warfarin.  Vitamin E may increase the anticoagulant effects of warfarin. Vitamin K may can cause warfarin to be less effective. Do not take or discontinue any vitamin, supplement, or herbal product except on the advice of your health care provider or pharmacist.  Eat what you normally eat and keep the vitamin K content of your diet consistent. Avoid major changes in your diet, or notify your health care provider before changing your diet. Suddenly getting a lot more vitamin K could cause your blood to clot too quickly. A sudden decrease in vitamin K intake could cause your blood to clot too slowly. These changes in vitamin K intake could lead to dangerous blood clotsor to bleeding. To keep your vitamin K intake consistent, you must be aware of which foods contain moderate or high amounts of vitamin K. Some foods high in vitamin K include spinach, kale, broccoli, cabbage, greens, Brussels sprouts, asparagus, [place]ok [place]oy, coleslaw, parsley, and green tea. Arrange a visit with a dietitian to answer your questions.  If you have a loss of appetite or get the stomach flu (viral gastroenteritis), talk to your health care provider as soon as possible. A decrease in your normal vitamin K intake can make you more sensitive to your usual dose of warfarin.  Some medical conditions may increase your risk for bleeding while you are taking warfarin. A fever, diarrhea lasting more than a day, worsening heart failure, or worsening liver function  are some medical conditions that could affect warfarin. Contact your health care provider if you have any of these medical conditions.  Alcohol can change the body's ability to handle warfarin. It is best to avoid alcoholic drinks or consume only very small amounts while taking warfarin. Notify your health care provider if you change your alcohol intake. A sudden increase in alcohol use can increase your risk of bleeding. Chronic alcohol use can cause warfarin to be less effective.  Be careful  not to cut yourself when using sharp objects or while shaving.  Inform all your health care providers and your dentist that you take an anticoagulant.  Limit physical activities or sports that could result in a fall or cause injury. Avoid contact sports.  Wear medical alert jewelry or carry a medical alert card. SEEK IMMEDIATE MEDICAL CARE IF:  You cough up blood.  You have dark or black stools or there is bright red blood coming from your rectum.  You vomit blood or have nausea for more than 1 day.  You have blood in the urine or pink colored urine.  You have unusual bruising or have increased bruising.  You have bleeding from the nose or gums that does not stop quickly.  You have a cut that does not stop bleeding within a 2-3 minutes.  You have sudden weakness or numbness of the face, arm, or leg, especially on one side of the body.  You have sudden confusion.  You have trouble speaking (aphasia) or understanding.  You have sudden trouble seeing in one or both eyes.  You have sudden trouble walking.  You have dizziness.  You have a loss of balance or coordination.  You have a sudden, severe headache.  You have a serious fall or head injury, even if you are not bleeding.  You have swelling or pain at an injection site.  You have unexplained tenderness or pain in the abdomen, back, waist, thighs or buttocks.  You have a fever. Any of these symptoms may represent a serious problem that is an emergency. Do not wait to see if the symptoms will go away. Get medical help right away. Call your local emergency services (911 in [place] Do not drive yourself to the hospital. Document Released: 12/29/2004 Document Revised: 01/03/2013 Document Reviewed: 08/03/2007 [facility] Patient Information 2015 [place], [place]. This information is not intended to replace advice given to you by your health care provider. Make sure you discuss any questions you have with your health care  provider.

## 2013-08-08 NOTE — ED Notes (Signed)
Patient discharged with all personal belongings. 

## 2013-08-12 LAB — CULTURE, BLOOD (ROUTINE X 2)
CULTURE: NO GROWTH
Culture: NO GROWTH

## 2013-09-08 ENCOUNTER — Ambulatory Visit (INDEPENDENT_AMBULATORY_CARE_PROVIDER_SITE_OTHER): Payer: Medicare Other | Admitting: Podiatry

## 2013-09-08 ENCOUNTER — Encounter: Payer: Self-pay | Admitting: Podiatry

## 2013-09-08 VITALS — BP 146/77 | HR 91 | Ht 64.0 in | Wt 152.0 lb

## 2013-09-08 DIAGNOSIS — B351 Tinea unguium: Secondary | ICD-10-CM | POA: Diagnosis not present

## 2013-09-08 DIAGNOSIS — M79609 Pain in unspecified limb: Secondary | ICD-10-CM | POA: Diagnosis not present

## 2013-09-08 DIAGNOSIS — M79606 Pain in leg, unspecified: Secondary | ICD-10-CM

## 2013-09-08 NOTE — Progress Notes (Signed)
Subjective: 79 year old female presents stating her toes are painful from thick long toe nails.   Objective: Ingrown nail right great toe without infection.  Thick dystrophic nails x 10.  Pedal pulses are not palpable.  No edema or erythema noted. No abnormal skin lesions noted.   Assessment: Ingrown mycotic nail right hallux medial border.   Plan: Debrided ingrown nail right hallux.  Return as needed.

## 2013-09-08 NOTE — Patient Instructions (Signed)
Seen for hypertrophic nails. All nails debrided. Return in 3 months or as needed.  

## 2013-09-27 ENCOUNTER — Telehealth: Payer: Self-pay | Admitting: *Deleted

## 2013-09-27 MED ORDER — CLOTRIMAZOLE-BETAMETHASONE 1-0.05 % EX CREA: 1.0000 "application " | TOPICAL_CREAM | Freq: Two times a day (BID) | CUTANEOUS | Status: DC

## 2013-09-27 NOTE — Telephone Encounter (Signed)
Patient called and stated her feet are extremely itchy and dry. Requested any topical cream or ointment to call in to her pharmacy.

## 2013-10-04 ENCOUNTER — Encounter: Payer: Self-pay | Admitting: Internal Medicine

## 2013-10-04 ENCOUNTER — Ambulatory Visit (INDEPENDENT_AMBULATORY_CARE_PROVIDER_SITE_OTHER): Payer: Medicare Other | Admitting: Internal Medicine

## 2013-10-04 VITALS — BP 140/86 | HR 86 | Temp 97.9°F | Resp 10 | Ht 63.5 in | Wt 159.0 lb

## 2013-10-04 DIAGNOSIS — E1129 Type 2 diabetes mellitus with other diabetic kidney complication: Secondary | ICD-10-CM

## 2013-10-04 DIAGNOSIS — I10 Essential (primary) hypertension: Secondary | ICD-10-CM

## 2013-10-04 DIAGNOSIS — E039 Hypothyroidism, unspecified: Secondary | ICD-10-CM | POA: Diagnosis not present

## 2013-10-04 DIAGNOSIS — K219 Gastro-esophageal reflux disease without esophagitis: Secondary | ICD-10-CM

## 2013-10-04 DIAGNOSIS — E785 Hyperlipidemia, unspecified: Secondary | ICD-10-CM

## 2013-10-04 DIAGNOSIS — IMO0002 Reserved for concepts with insufficient information to code with codable children: Secondary | ICD-10-CM

## 2013-10-04 DIAGNOSIS — E1165 Type 2 diabetes mellitus with hyperglycemia: Secondary | ICD-10-CM

## 2013-10-04 NOTE — Progress Notes (Signed)
Patient ID: Allison [name], female   DOB: 07-21-1934, 79 y.o.   MRN: [id number]    Chief Complaint  Patient presents with  . Establish Care    New patient establish care   . Medication Management    Patient would like to discuss medications. Patient has 3 pill bottles for Glimepiride 4 mg  BID   Allergies  Allergen Reactions  . Pineapple Anaphylaxis  . Codeine Other (See Comments)    unknown  . Invokana [Canagliflozin] Other (See Comments)    Constipation, nightmares  . Penicillins Other (See Comments)    Unknown   . Rofecoxib Other (See Comments)    unknown   HPI 79 y/o female patient is here to establish care. She was being seen at [facility] by Dr Doristine Section Bonsu - last seen in [place] 2015. She has history of dm type 2, HTN, GERD, PVD, hypothyroidism, hyperlipidemia and hypercalcemia Blood pressure has been stable on lotrel, chlorthalidone Patient has some confusion about her medications with her having multiple pill bottles of same medication She was in the hospital from 08/05/13- 08/07/13 with abdominal pain and constipation though to be from invokana  Wt Readings from Last 3 Encounters:  10/04/13 159 lb (72.122 kg)  09/08/13 152 lb (68.947 kg)  08/07/13 155 lb (70.308 kg)    Review of Systems  Constitutional: Negative for fever, chills, weight loss, diaphoresis. gets tired sometimes as she is taking care of her aunt who is under hospice care HENT: Negative for congestion, hearing loss and sore throat.   Eyes: Negative for blurred vision, double vision and discharge. wears glasses. Last eye exam 12/14/12 Respiratory: Negative for cough, sputum production, shortness of breath and wheezing.   Cardiovascular: Negative for chest pain, palpitations, leg swelling.  Gastrointestinal: has heartburn. Negative for nausea, vomiting, abdominal pain, diarrhea, rectal bleeed or melena and constipation.  Genitourinary: Negative for dysuria, urgency, frequency and flank  pain.  Musculoskeletal: Negative for back pain, falls. History of sciatica and mobic helps. Sees a podiatry Skin: Negative for itching and rash.  Neurological: negative for dizziness, tingling, focal weakness and headaches.  Psychiatric/Behavioral: Negative for depression and memory loss. The patient is not nervous/anxious.     Past Medical History  Diagnosis Date  . SOB (shortness of breath)   . Cough   . Dysphagia   . Hiatal hernia   . Diabetes mellitus without complication   . Hypertension   . Onychomycosis 03/05/2012  . Pain in lower limb 04/18/2013  . Vertigo   . Sciatic nerve pain    Past Surgical History  Procedure Laterality Date  . Vesicovaginal fistula closure w/ tah  1976  . Partial hysterectomy  1978   Family History  Problem Relation Age of Onset  . Asthma Sister   . Sleep apnea Sister   . Heart disease Sister   . Heart disease Father   . Allergies Sister   . Allergies Sister   . Alzheimer's disease Mother   . Alzheimer's disease Father    History   Social History  . Marital Status: Single    Spouse Name: N/A    Number of Children: 2  . Years of Education: N/A   Occupational History  . Retired    Social History Main Topics  . Smoking status: Never Smoker   . Smokeless tobacco: Never Used  . Alcohol Use: No  . Drug Use: No  . Sexual Activity: No   Other Topics Concern  . Not on  file   Social History Narrative  . No narrative on file   Current Outpatient Prescriptions on File Prior to Visit  Medication Sig Dispense Refill  . aspirin 81 MG tablet Take 81 mg by mouth every other day.       . chlorthalidone (HYGROTON) 25 MG tablet Take 25 mg by mouth daily.       . diphenhydrAMINE (BENADRYL) 25 MG tablet Take 25 mg by mouth daily as needed for allergies.      Marland Kitchen glyBURIDE-metformin (GLUCOVANCE) 5-500 MG per tablet Take 2 tablets by mouth 2 (two) times daily with a meal.        . levothyroxine (SYNTHROID, LEVOTHROID) 150 MCG tablet Take 150 mcg by  mouth every morning.       Marland Kitchen omeprazole (PRILOSEC) 40 MG capsule Take 40 mg by mouth daily. For Acid Reflux      . RESTASIS 0.05 % ophthalmic emulsion Place 1 drop into both eyes 2 (two) times daily.       . vitamin E (VITAMIN E) 1000 UNIT capsule Take 1,000 Units by mouth daily.       No current facility-administered medications on file prior to visit.   Physical exam BP 140/86  Pulse 86  Temp(Src) 97.9 F (36.6 C) (Oral)  Resp 10  Ht 5' 3.5" (1.613 m)  Wt 159 lb (72.122 kg)  BMI 27.72 kg/m2  SpO2 99%  General- elderly female in no acute distress Head- atraumatic, normocephalic Eyes- PERRLA, EOMI, no pallor, no icterus, no discharge Neck- no lymphadenopathy, no thyromegaly Cardiovascular- normal s1,s2, no murmurs Respiratory- bilateral clear to auscultation, no wheeze, no rhonchi, no crackles Abdomen- bowel sounds present, soft, non tender Musculoskeletal- able to move all 4 extremities, normal range of motion, no leg edema Neurological- no focal deficit Skin- warm and dry Psychiatry- alert and oriented to person, place and time, normal mood and affect  Labs- Lab Results  Component Value Date   WBC 6.4 08/07/2013   HGB 12.1 08/07/2013   HCT 35.5* 08/07/2013   MCV 79.1 08/07/2013   PLT 218 08/07/2013   CMP     Component Value Date/Time   NA 140 08/07/2013 0750   K 3.7 08/07/2013 0750   CL 103 08/07/2013 0750   CO2 24 08/07/2013 0750   GLUCOSE 159* 08/07/2013 0750   BUN 18 08/07/2013 0750   CREATININE 0.94 08/07/2013 0750   CALCIUM 9.0 08/07/2013 0750   CALCIUM 10.7* 08/05/2013 2053   PROT 6.9 08/06/2013 0449   ALBUMIN 3.6 08/06/2013 0449   AST 13 08/06/2013 0449   ALT 11 08/06/2013 0449   ALKPHOS 64 08/06/2013 0449   BILITOT 0.7 08/06/2013 0449   GFRNONAA 57* 08/07/2013 0750   GFRAA 67* 08/07/2013 0750   Lab Results  Component Value Date   HGBA1C 10.8* 08/05/2013   Lab Results  Component Value Date   PTH 71.8 08/05/2013   CALCIUM 9.0 08/07/2013   Imaging US Abdomen  Complete  08/05/2013   CLINICAL DATA:  Right upper quadrant abdominal pain.  EXAM: ULTRASOUND ABDOMEN COMPLETE  COMPARISON:  No priors.  FINDINGS: Gallbladder:  No gallstones or wall thickening visualized. No sonographic Murphy sign noted.  Common bile duct:  Diameter: 3.1 mm in the porta hepatis.  Liver:  No focal lesion identified. Within normal limits in parenchymal echogenicity.  IVC:  No abnormality visualized.  Pancreas:  Visualized portion unremarkable.  Spleen:  Size and appearance within normal limits.  6.1 cm in length.  Right Kidney:  Length: 12.2 cm. Echogenicity within normal limits. No mass or hydronephrosis visualized.  Left Kidney:  Length: 12.6 cm. Echogenicity within normal limits. No mass or hydronephrosis visualized.  Abdominal aorta:  No aneurysm visualized.  Other findings:  None.  IMPRESSION: 1. No acute findings to account for the patient's symptoms. 2. Specifically, no evidence of gallstones and no findings to suggest an acute cholecystitis at this time.   Electronically Signed   By: Vinnie Langton M.D.   On: 08/05/2013 17:30   Dg Abd Acute W/chest  08/05/2013   CLINICAL DATA:  79 year old female with abdominal pain, constipation, nausea and vomiting.  EXAM: ACUTE ABDOMEN SERIES (ABDOMEN 2 VIEW & CHEST 1 VIEW)  COMPARISON:  10/22/2012 and prior radiographs  FINDINGS: The cardiomediastinal silhouette is unremarkable.  The lungs are clear.  There is no evidence of airspace disease, pleural effusion or pneumothorax.  The bowel gas pattern is unremarkable.  There is no evidence of bowel obstruction or pneumoperitoneum.  No acute bony abnormalities are present.  No suspicious calcifications are identified.  IMPRESSION: No acute abnormalities.  Unremarkable bowel gas pattern.   Electronically Signed   By: Hassan Rowan M.D.   On: 08/05/2013 17:13    Assessment/plan  1. HYPERTENSION, ESSENTIAL NOS Stable bp reading, continue lotrel 10-40 mg daily and chlorthalidone - CBC with Differential;  Future - Lipid Panel; Future - Vitamin D, 1,25-dihydroxy; Future  2. Gastroesophageal reflux disease, esophagitis presence not specified Continue prilosec 40 mg daily  3. DM type 2, uncontrolled, with renal complications Continue glyburide- metformin 5-500 mg 2 tab bid. glimeperide was not in her prior med list or in hospital, advised pt not totake it for now until next a1c check. Monitor cbg. Continue benazepril and asa. Not on any statin, check lipid panel next visit - Hemoglobin A1c; Future  4. HYPOTHYROIDISM Continue synthroid, current regmine - CMP; Future - TSH; Future - Hemoglobin A1c; Future  5. DYSLIPIDEMIA Not on any statin - Lipid Panel; Future  6. Hypercalcemia Check vit d and pth level  - Vitamin D, 1,25-dihydroxy; Future - PTH, Intact and Calcium; Future

## 2013-10-16 ENCOUNTER — Other Ambulatory Visit: Payer: Self-pay

## 2013-10-16 DIAGNOSIS — Z1231 Encounter for screening mammogram for malignant neoplasm of breast: Secondary | ICD-10-CM

## 2013-11-14 DIAGNOSIS — H40033 Anatomical narrow angle, bilateral: Secondary | ICD-10-CM | POA: Diagnosis not present

## 2013-11-14 DIAGNOSIS — E119 Type 2 diabetes mellitus without complications: Secondary | ICD-10-CM | POA: Diagnosis not present

## 2013-11-15 ENCOUNTER — Other Ambulatory Visit: Payer: Medicare Other

## 2013-11-15 DIAGNOSIS — E1165 Type 2 diabetes mellitus with hyperglycemia: Secondary | ICD-10-CM | POA: Diagnosis not present

## 2013-11-15 DIAGNOSIS — E039 Hypothyroidism, unspecified: Secondary | ICD-10-CM | POA: Diagnosis not present

## 2013-11-15 DIAGNOSIS — K589 Irritable bowel syndrome without diarrhea: Secondary | ICD-10-CM | POA: Diagnosis not present

## 2013-11-15 DIAGNOSIS — I10 Essential (primary) hypertension: Secondary | ICD-10-CM

## 2013-11-15 DIAGNOSIS — E1129 Type 2 diabetes mellitus with other diabetic kidney complication: Secondary | ICD-10-CM | POA: Diagnosis not present

## 2013-11-15 DIAGNOSIS — E785 Hyperlipidemia, unspecified: Secondary | ICD-10-CM | POA: Diagnosis not present

## 2013-11-15 DIAGNOSIS — E119 Type 2 diabetes mellitus without complications: Secondary | ICD-10-CM | POA: Diagnosis not present

## 2013-11-15 DIAGNOSIS — IMO0002 Reserved for concepts with insufficient information to code with codable children: Secondary | ICD-10-CM

## 2013-11-16 LAB — COMPREHENSIVE METABOLIC PANEL
ALT: 14 IU/L (ref 0–32)
AST: 13 IU/L (ref 0–40)
Albumin/Globulin Ratio: 1.7 (ref 1.1–2.5)
Albumin: 4.2 g/dL (ref 3.5–4.8)
Alkaline Phosphatase: 63 IU/L (ref 39–117)
BUN/Creatinine Ratio: 12 (ref 11–26)
BUN: 9 mg/dL (ref 8–27)
CALCIUM: 9.9 mg/dL (ref 8.7–10.3)
CO2: 28 mmol/L (ref 18–29)
CREATININE: 0.75 mg/dL (ref 0.57–1.00)
Chloride: 98 mmol/L (ref 97–108)
GFR calc Af Amer: 90 mL/min/{1.73_m2} (ref >59)
GFR calc non Af Amer: 78 mL/min/{1.73_m2} (ref >59)
GLOBULIN, TOTAL: 2.5 g/dL (ref 1.5–4.5)
GLUCOSE: 120 mg/dL — AB (ref 65–99)
Potassium: 3.6 mmol/L (ref 3.5–5.2)
Sodium: 140 mmol/L (ref 134–144)
Total Bilirubin: 0.6 mg/dL (ref 0.0–1.2)
Total Protein: 6.7 g/dL (ref 6.0–8.5)

## 2013-11-16 LAB — CBC WITH DIFFERENTIAL/PLATELET
BASOS ABS: 0.1 10*3/uL (ref 0.0–0.2)
Basos: 1 %
EOS ABS: 0.3 10*3/uL (ref 0.0–0.4)
Eos: 4 %
HEMATOCRIT: 36.7 % (ref 34.0–46.6)
Hemoglobin: 12.4 g/dL (ref 11.1–15.9)
IMMATURE GRANULOCYTES: 0 %
Immature Grans (Abs): 0 10*3/uL (ref 0.0–0.1)
LYMPHS ABS: 2.3 10*3/uL (ref 0.7–3.1)
Lymphs: 26 %
MCH: 27.3 pg (ref 26.6–33.0)
MCHC: 33.8 g/dL (ref 31.5–35.7)
MCV: 81 fL (ref 79–97)
MONOCYTES: 8 %
Monocytes Absolute: 0.7 10*3/uL (ref 0.1–0.9)
Neutrophils Absolute: 5.4 10*3/uL (ref 1.4–7.0)
Neutrophils Relative %: 61 %
RBC: 4.54 x10E6/uL (ref 3.77–5.28)
RDW: 15.1 % (ref 12.3–15.4)
WBC: 8.9 10*3/uL (ref 3.4–10.8)

## 2013-11-16 LAB — VITAMIN D 1,25 DIHYDROXY: Vit D, 1,25-Dihydroxy: 29.4 pg/mL (ref 19.9–79.3)

## 2013-11-16 LAB — LIPID PANEL
Chol/HDL Ratio: 5.8 ratio units — ABNORMAL HIGH (ref 0.0–4.4)
Cholesterol, Total: 282 mg/dL — ABNORMAL HIGH (ref 100–199)
HDL: 49 mg/dL (ref >39)
LDL Calculated: 205 mg/dL — ABNORMAL HIGH (ref 0–99)
Triglycerides: 141 mg/dL (ref 0–149)
VLDL Cholesterol Cal: 28 mg/dL (ref 5–40)

## 2013-11-16 LAB — PTH, INTACT AND CALCIUM: PTH: 27 pg/mL (ref 15–65)

## 2013-11-16 LAB — HEMOGLOBIN A1C
ESTIMATED AVERAGE GLUCOSE: 171 mg/dL
Hgb A1c MFr Bld: 7.6 % — ABNORMAL HIGH (ref 4.8–5.6)

## 2013-11-16 LAB — TSH: TSH: 6.54 u[IU]/mL — AB (ref 0.450–4.500)

## 2013-11-20 ENCOUNTER — Ambulatory Visit
Admission: RE | Admit: 2013-11-20 | Discharge: 2013-11-20 | Disposition: A | Discharge status: Home / Self Care | Payer: Medicare Other | Source: Ambulatory Visit

## 2013-11-20 DIAGNOSIS — Z1231 Encounter for screening mammogram for malignant neoplasm of breast: Secondary | ICD-10-CM | POA: Diagnosis not present

## 2013-11-21 DIAGNOSIS — H43813 Vitreous degeneration, bilateral: Secondary | ICD-10-CM | POA: Diagnosis not present

## 2013-11-29 ENCOUNTER — Telehealth: Payer: Self-pay | Admitting: *Deleted

## 2013-11-29 NOTE — Telephone Encounter (Signed)
Patient called and stated that she wants her A1C number for Dr. Lorin Mercy because she is having a time with her Siatica and Dr. Lorin Mercy wants to know what her lab number for A1C is. Gave her her lab results.

## 2013-12-05 ENCOUNTER — Encounter: Payer: Self-pay | Admitting: Internal Medicine

## 2013-12-05 ENCOUNTER — Ambulatory Visit: Payer: Medicare Other | Admitting: *Deleted

## 2013-12-05 ENCOUNTER — Ambulatory Visit (INDEPENDENT_AMBULATORY_CARE_PROVIDER_SITE_OTHER): Payer: Medicare Other

## 2013-12-05 ENCOUNTER — Ambulatory Visit (INDEPENDENT_AMBULATORY_CARE_PROVIDER_SITE_OTHER): Payer: Medicare Other | Admitting: Internal Medicine

## 2013-12-05 VITALS — BP 160/84 | HR 86 | Temp 97.9°F | Resp 18 | Ht 63.5 in | Wt 156.2 lb

## 2013-12-05 DIAGNOSIS — D72829 Elevated white blood cell count, unspecified: Secondary | ICD-10-CM

## 2013-12-05 DIAGNOSIS — K219 Gastro-esophageal reflux disease without esophagitis: Secondary | ICD-10-CM

## 2013-12-05 DIAGNOSIS — Z1211 Encounter for screening for malignant neoplasm of colon: Secondary | ICD-10-CM

## 2013-12-05 DIAGNOSIS — Z23 Encounter for immunization: Secondary | ICD-10-CM

## 2013-12-05 DIAGNOSIS — E1165 Type 2 diabetes mellitus with hyperglycemia: Secondary | ICD-10-CM

## 2013-12-05 DIAGNOSIS — E039 Hypothyroidism, unspecified: Secondary | ICD-10-CM | POA: Diagnosis not present

## 2013-12-05 DIAGNOSIS — I10 Essential (primary) hypertension: Secondary | ICD-10-CM | POA: Diagnosis not present

## 2013-12-05 DIAGNOSIS — IMO0002 Reserved for concepts with insufficient information to code with codable children: Secondary | ICD-10-CM

## 2013-12-05 DIAGNOSIS — Z0000 Encounter for general adult medical examination without abnormal findings: Secondary | ICD-10-CM

## 2013-12-05 DIAGNOSIS — E1129 Type 2 diabetes mellitus with other diabetic kidney complication: Secondary | ICD-10-CM

## 2013-12-05 DIAGNOSIS — E785 Hyperlipidemia, unspecified: Secondary | ICD-10-CM | POA: Diagnosis not present

## 2013-12-05 MED ORDER — LEVOTHYROXINE SODIUM 175 MCG PO TABS: 175.0000 ug | ORAL_TABLET | Freq: Every morning | ORAL | Status: DC

## 2013-12-05 MED ORDER — ATORVASTATIN CALCIUM 10 MG PO TABS: 10.0000 mg | ORAL_TABLET | Freq: Every day | ORAL | Status: DC

## 2013-12-05 NOTE — Progress Notes (Signed)
Patient ID: Allison [name], female   DOB: 1934/02/06, 79 y.o.   MRN: [id number]     Allergies  Allergen Reactions  . Pineapple Anaphylaxis  . Codeine Other (See Comments)    unknown  . Invokana [Canagliflozin] Other (See Comments)    Constipation, nightmares  . Penicillins Other (See Comments)    Unknown   . Rofecoxib Other (See Comments)    unknown    Chief Complaint  Patient presents with  . Annual Exam     HPI:  79 y/o female patient is here for annual exam.  She has history of dm type 2, HTN, GERD, PVD, hypothyroidism, hyperlipidemia and hypercalcemia She sees urology, orthopedics, podiatry among others  Health maintenance Normal mammogram 11/15/2013 Colonoscopy: normal almost 10 years back.  Due for influenza vaccine Has never had pneumococcal vaccine  Immunization History  Administered Date(s) Administered  . Influenza Whole 09/13/2011  . Td 06/13/2003    Review of Systems:  Constitutional: Negative for fever, chills, weight loss, diaphoresis. Energy level good HENT: Negative for congestion, hearing loss and sore throat.   Eyes: Negative for blurred vision, double vision and discharge. wears glasses. Last eye exam 10/15 Respiratory: Negative for cough, sputum production, shortness of breath and wheezing.   Cardiovascular: Negative for chest pain, palpitations, orthopnea, leg swelling.  Gastrointestinal: has occasional heartburn. Negative for nausea, vomiting, abdominal pain, diarrhea, rectal bleeed or melena. Has occasional constipation with her being on tramadol. Takes OTC stool softener occasionally and prunes Genitourinary: Negative for dysuria, urgency, frequency and flank pain. Bladder repair with mesh for bladder prolapse in 2014. Follows with Dr Davis Gourd from [facility] Musculoskeletal: Negative for back pain, falls. History of sciatica, follows with orthopedics Dr Lorin Mercy and tramadol helps. Sees a podiatry. Uses a cane Skin: Negative for itching and rash.   Neurological: negative for dizziness, focal weakness and headaches. Has occasional numbness in both her hands Psychiatric/Behavioral: Negative for depression and memory loss. The patient is not nervous/anxious.  Wt Readings from Last 3 Encounters:  12/05/13 156 lb 3.2 oz (70.852 kg)  10/04/13 159 lb (72.122 kg)  09/08/13 152 lb (68.947 kg)      Past Medical History  Diagnosis Date  . SOB (shortness of breath)   . Cough   . Dysphagia   . Hiatal hernia   . Diabetes mellitus without complication   . Hypertension   . Onychomycosis 03/05/2012  . Pain in lower limb 04/18/2013  . Vertigo   . Sciatic nerve pain    Past Surgical History  Procedure Laterality Date  . Vesicovaginal fistula closure w/ tah  1976  . Partial hysterectomy  1978   Social History:   reports that she has never smoked. She has never used smokeless tobacco. She reports that she does not drink alcohol or use illicit drugs.  Family History  Problem Relation Age of Onset  . Asthma Sister   . Sleep apnea Sister   . Heart disease Sister   . Heart disease Father   . Allergies Sister   . Allergies Sister   . Alzheimer's disease Mother   . Alzheimer's disease Father     Medications: Patient's Medications  New Prescriptions   No medications on file  Previous Medications   AMLODIPINE-BENAZEPRIL (LOTREL) 10-40 MG PER CAPSULE    Take 1 capsule by mouth daily. For elevated blood pressure   ASPIRIN 81 MG TABLET    Take 81 mg by mouth every other day.    CHLORTHALIDONE (HYGROTON) 25 MG TABLET  Take 25 mg by mouth daily.    DIPHENHYDRAMINE (BENADRYL) 25 MG TABLET    Take 25 mg by mouth daily as needed for allergies.   GLYBURIDE-METFORMIN (GLUCOVANCE) 5-500 MG PER TABLET    Take 2 tablets by mouth 2 (two) times daily with a meal.     LEVOTHYROXINE (SYNTHROID, LEVOTHROID) 150 MCG TABLET    Take 150 mcg by mouth every morning.    MELOXICAM (MOBIC) 15 MG TABLET    Take 15 mg by mouth daily.   OMEPRAZOLE (PRILOSEC) 40  MG CAPSULE    Take 40 mg by mouth daily. For Acid Reflux   RESTASIS 0.05 % OPHTHALMIC EMULSION    Place 1 drop into both eyes 2 (two) times daily.    TRAMADOL (ULTRAM) 50 MG TABLET    Take 50 mg by mouth 2 (two) times daily.    VITAMIN E (VITAMIN E) 1000 UNIT CAPSULE    Take 1,000 Units by mouth daily.  Modified Medications   No medications on file  Discontinued Medications   No medications on file     Physical Exam:  Filed Vitals:   12/05/13 1158  BP: 160/84  Pulse: 86  Temp: 97.9 F (36.6 C)  TempSrc: Oral  Resp: 18  Height: 5' 3.5" (1.613 m)  Weight: 156 lb 3.2 oz (70.852 kg)  SpO2: 98%    General- elderly female in no acute distress Head- atraumatic, normocephalic Eyes- PERRLA, EOMI, no pallor, no icterus, no discharge Neck- no cervical lymphadenopathy, no thyromegaly, no jugular vein distension, no carotid bruit Ears- left ear normal tympanic membrane and normal external ear canal , right ear normal tympanic membrane and normal external ear canal Throat- moist mucus membrane, normal oropharynx, upper and lower dentures  Nose- normal nasal mucosa, no maxillary or frontal sinus tenderness Chest- no chest wall deformities, no chest wall tenderness Breast- uptodate Cardiovascular- normal s1,s2, no murmurs/ rubs/ gallops, dorsalis pedis palpable Respiratory- bilateral clear to auscultation, no wheeze, no rhonchi, no crackles, no use of accessory muscles Abdomen- bowel sounds present, soft, non tender, no organomegaly, no abdominal bruits, no guarding or rigidity, no CVA tenderness Pelvic exam- refused Musculoskeletal- able to move all 4 extremities, no spinal and paraspinal tenderness, steady gait, no use of assistive device, normal range of motion, no leg edema Neurological- no focal deficit, normal reflexes, normal muscle strength, normal sensation to fine touch and vibration Skin- warm and dry Psychiatry- alert and oriented to person, place and time, normal mood and  affect    Labs reviewed: Basic Metabolic Panel:  Recent Labs  08/06/13 0449 08/07/13 0750 11/15/13 0812  NA 141 140 140  K 3.7 3.7 3.6  CL 99 103 98  CO2 25 24 28   GLUCOSE 100* 159* 120*  BUN 35* 18 9  CREATININE 2.20* 0.94 0.75  CALCIUM 9.3 9.0 9.9   Liver Function Tests:  Recent Labs  08/05/13 1540 08/06/13 0449 11/15/13 0812  AST 15 13 13   ALT 15 11 14   ALKPHOS 80 64 63  BILITOT 0.6 0.7 0.6  PROT 8.4* 6.9 6.7  ALBUMIN 4.4 3.6  --     Recent Labs  08/05/13 1540  LIPASE 37   No results for input(s): AMMONIA in the last 8760 hours. CBC:  Recent Labs  08/05/13 1540 08/06/13 0449 08/07/13 0626 11/15/13 0812  WBC 11.6* 11.5* 6.4 8.9  NEUTROABS 7.4 5.8  --  5.4  HGB 15.1* 13.2 12.1 12.4  HCT 42.8 38.9 35.5* 36.7  MCV 79.1 79.1  79.1 81  PLT 305 258 218  --    CBG:  Recent Labs  08/06/13 2125 08/07/13 0755 08/07/13 1219  GLUCAP 178* 158* 203*   Lab Results  Component Value Date   HGBA1C 7.6* 11/15/2013   Lipid Panel     Component Value Date/Time   TRIG 141 11/15/2013 0812   HDL 49 11/15/2013 0812   CHOLHDL 5.8* 11/15/2013 0812   LDLCALC 205* 11/15/2013 0812   Lab Results  Component Value Date   TSH 6.540* 11/15/2013   Normal vitamin d  PTH    Component Value Date/Time   PTH 27 11/15/2013 0812   PTH Comment 11/15/2013 0812   12/05/13 mmse 29/30, passed clock draw  Assessment/Plan  1. Essential hypertension Stable, continue chlorthalidone and lotrel. Monitor bp. Continue aspirin - CMP; Future - Lipid Panel; Future - Hemoglobin A1c; Future  2. Gastroesophageal reflux disease, esophagitis presence not specified Continue prilosec for now  3. DM type 2, uncontrolled, with renal complications Continue current regimen of glyburide- metformin with aspirin. On benazepril. Started statin today. Normal foot exam. uptodate on eye exam - Microalbumin/Creatinine Ratio, Urine - Lipid Panel; Future - Hemoglobin A1c; Future  4.  Hypothyroidism, unspecified hypothyroidism type With deranged tsh, increase levothyroxine to 175 mcg daily for now - TSH; Future  5. Hypercalcemia Repeat check has normal calcium level  6. Leukocytosis Resolved, normal wbc count  7. Hyperlipidemia LDL goal <130 Start lipitor 10 mg daily and recheck lipid panel in 3 months.  - Lipid Panel; Future  8. Annual exam the patient was counseled regarding regular self-examination of the breasts on a monthly basis, prevention of dental and periodontal disease, diet, regular sustained exercise for at least 30 minutes 5 times per week, routine screening interval for mammogram as recommended by the [place] and ACOG, the proper use of sunscreen and protective clothing, tobacco use, and recommended schedule for GI hemoccult testing, colonoscopy, cholesterol, thyroid and diabetes screening. FOBT card provided. Copy of lab work provided for her records. Reading material on advance directive provided. MMSE score 29/30, independent with her ADLs. Monitor clinically   Blanchie Serve, MD  [facility] [phone] (Monday-Friday 8 am - 5 pm) [phone] (afterhours)

## 2013-12-06 LAB — MICROALBUMIN / CREATININE URINE RATIO
Creatinine, Ur: 65.2 mg/dL (ref 15.0–278.0)
MICROALB/CREAT RATIO: 26.1 mg/g creat (ref 0.0–30.0)
Microalbumin, Urine: 17 ug/mL (ref 0.0–17.0)

## 2013-12-12 ENCOUNTER — Encounter: Payer: Self-pay | Admitting: Podiatry

## 2013-12-12 ENCOUNTER — Ambulatory Visit (INDEPENDENT_AMBULATORY_CARE_PROVIDER_SITE_OTHER): Payer: Medicare Other | Admitting: Podiatry

## 2013-12-12 VITALS — BP 161/81 | HR 92

## 2013-12-12 DIAGNOSIS — B351 Tinea unguium: Secondary | ICD-10-CM

## 2013-12-12 DIAGNOSIS — M79606 Pain in leg, unspecified: Secondary | ICD-10-CM | POA: Diagnosis not present

## 2013-12-12 NOTE — Progress Notes (Signed)
Subjective: 79 year old female presents stating her toes are painful from thick long toe nails.   Objective: Ingrown nail right great toe without infection.  Thick dystrophic nails x 10.  Pedal pulses are not palpable.  No edema or erythema noted. No abnormal skin lesions noted.   Assessment: Ingrown mycotic nail right hallux medial border.   Plan: Debrided ingrown nail right hallux.  Return as needed.

## 2013-12-12 NOTE — Patient Instructions (Signed)
Seen for hypertrophic nails. All nails debrided. Return in 3 months or as needed.  

## 2013-12-13 DIAGNOSIS — M5137 Other intervertebral disc degeneration, lumbosacral region: Secondary | ICD-10-CM | POA: Diagnosis not present

## 2013-12-13 DIAGNOSIS — M5431 Sciatica, right side: Secondary | ICD-10-CM | POA: Diagnosis not present

## 2013-12-13 DIAGNOSIS — M9904 Segmental and somatic dysfunction of sacral region: Secondary | ICD-10-CM | POA: Diagnosis not present

## 2013-12-13 DIAGNOSIS — M5415 Radiculopathy, thoracolumbar region: Secondary | ICD-10-CM | POA: Diagnosis not present

## 2013-12-13 DIAGNOSIS — M9903 Segmental and somatic dysfunction of lumbar region: Secondary | ICD-10-CM | POA: Diagnosis not present

## 2013-12-13 DIAGNOSIS — M9902 Segmental and somatic dysfunction of thoracic region: Secondary | ICD-10-CM | POA: Diagnosis not present

## 2013-12-14 ENCOUNTER — Other Ambulatory Visit: Payer: Medicare Other

## 2013-12-14 DIAGNOSIS — Z1211 Encounter for screening for malignant neoplasm of colon: Secondary | ICD-10-CM

## 2013-12-17 LAB — FECAL OCCULT BLOOD, IMMUNOCHEMICAL: Fecal Occult Bld: POSITIVE — AB

## 2013-12-21 ENCOUNTER — Telehealth: Payer: Self-pay

## 2013-12-21 DIAGNOSIS — M9903 Segmental and somatic dysfunction of lumbar region: Secondary | ICD-10-CM | POA: Diagnosis not present

## 2013-12-21 DIAGNOSIS — R195 Other fecal abnormalities: Secondary | ICD-10-CM

## 2013-12-21 DIAGNOSIS — M5137 Other intervertebral disc degeneration, lumbosacral region: Secondary | ICD-10-CM | POA: Diagnosis not present

## 2013-12-21 DIAGNOSIS — M9904 Segmental and somatic dysfunction of sacral region: Secondary | ICD-10-CM | POA: Diagnosis not present

## 2013-12-21 DIAGNOSIS — M9902 Segmental and somatic dysfunction of thoracic region: Secondary | ICD-10-CM | POA: Diagnosis not present

## 2013-12-21 DIAGNOSIS — M5431 Sciatica, right side: Secondary | ICD-10-CM | POA: Diagnosis not present

## 2013-12-21 DIAGNOSIS — M5415 Radiculopathy, thoracolumbar region: Secondary | ICD-10-CM | POA: Diagnosis not present

## 2013-12-21 NOTE — Telephone Encounter (Signed)
-----   Message from Blanchie Serve, MD sent at 12/20/2013 10:06 AM EST ----- Your stool test is positive for blood. i will need you to see GI doctor for colonoscopy to evaluate further. Please schedule an appointment for pt with gi

## 2013-12-21 NOTE — Telephone Encounter (Signed)
Spoke with patient's son, ok to place GI Referral

## 2013-12-22 ENCOUNTER — Telehealth: Payer: Self-pay | Admitting: Internal Medicine

## 2013-12-22 NOTE — Telephone Encounter (Signed)
Pt called the office regarding the lab test discussed with her son.  Told pt blood was in stool, and Dr. Bubba Camp is referring her to a GI Spec for a colonscophic. Told pt someone would be in contact with her.  Says its ok to contact her son at [phone]..Carolin Coy

## 2013-12-23 DIAGNOSIS — M9902 Segmental and somatic dysfunction of thoracic region: Secondary | ICD-10-CM | POA: Diagnosis not present

## 2013-12-23 DIAGNOSIS — M5415 Radiculopathy, thoracolumbar region: Secondary | ICD-10-CM | POA: Diagnosis not present

## 2013-12-23 DIAGNOSIS — M5137 Other intervertebral disc degeneration, lumbosacral region: Secondary | ICD-10-CM | POA: Diagnosis not present

## 2013-12-23 DIAGNOSIS — M5431 Sciatica, right side: Secondary | ICD-10-CM | POA: Diagnosis not present

## 2013-12-23 DIAGNOSIS — M9904 Segmental and somatic dysfunction of sacral region: Secondary | ICD-10-CM | POA: Diagnosis not present

## 2013-12-23 DIAGNOSIS — M9903 Segmental and somatic dysfunction of lumbar region: Secondary | ICD-10-CM | POA: Diagnosis not present

## 2013-12-25 DIAGNOSIS — M9904 Segmental and somatic dysfunction of sacral region: Secondary | ICD-10-CM | POA: Diagnosis not present

## 2013-12-25 DIAGNOSIS — M9903 Segmental and somatic dysfunction of lumbar region: Secondary | ICD-10-CM | POA: Diagnosis not present

## 2013-12-25 DIAGNOSIS — M5137 Other intervertebral disc degeneration, lumbosacral region: Secondary | ICD-10-CM | POA: Diagnosis not present

## 2013-12-25 DIAGNOSIS — M9902 Segmental and somatic dysfunction of thoracic region: Secondary | ICD-10-CM | POA: Diagnosis not present

## 2013-12-25 DIAGNOSIS — M5431 Sciatica, right side: Secondary | ICD-10-CM | POA: Diagnosis not present

## 2013-12-25 DIAGNOSIS — M5415 Radiculopathy, thoracolumbar region: Secondary | ICD-10-CM | POA: Diagnosis not present

## 2013-12-27 ENCOUNTER — Telehealth: Payer: Self-pay | Admitting: *Deleted

## 2013-12-27 DIAGNOSIS — M5137 Other intervertebral disc degeneration, lumbosacral region: Secondary | ICD-10-CM | POA: Diagnosis not present

## 2013-12-27 DIAGNOSIS — M5415 Radiculopathy, thoracolumbar region: Secondary | ICD-10-CM | POA: Diagnosis not present

## 2013-12-27 DIAGNOSIS — M9904 Segmental and somatic dysfunction of sacral region: Secondary | ICD-10-CM | POA: Diagnosis not present

## 2013-12-27 DIAGNOSIS — M5431 Sciatica, right side: Secondary | ICD-10-CM | POA: Diagnosis not present

## 2013-12-27 DIAGNOSIS — M9902 Segmental and somatic dysfunction of thoracic region: Secondary | ICD-10-CM | POA: Diagnosis not present

## 2013-12-27 DIAGNOSIS — M9903 Segmental and somatic dysfunction of lumbar region: Secondary | ICD-10-CM | POA: Diagnosis not present

## 2013-12-27 NOTE — Telephone Encounter (Signed)
Patient dropped off Handicap Placard. Placard filled out and Dr. Bubba Camp signed and mailed to patient. Copy sent for scanning.

## 2013-12-30 DIAGNOSIS — M5415 Radiculopathy, thoracolumbar region: Secondary | ICD-10-CM | POA: Diagnosis not present

## 2013-12-30 DIAGNOSIS — M9903 Segmental and somatic dysfunction of lumbar region: Secondary | ICD-10-CM | POA: Diagnosis not present

## 2013-12-30 DIAGNOSIS — M9902 Segmental and somatic dysfunction of thoracic region: Secondary | ICD-10-CM | POA: Diagnosis not present

## 2013-12-30 DIAGNOSIS — M5431 Sciatica, right side: Secondary | ICD-10-CM | POA: Diagnosis not present

## 2013-12-30 DIAGNOSIS — M9904 Segmental and somatic dysfunction of sacral region: Secondary | ICD-10-CM | POA: Diagnosis not present

## 2013-12-30 DIAGNOSIS — M5137 Other intervertebral disc degeneration, lumbosacral region: Secondary | ICD-10-CM | POA: Diagnosis not present

## 2014-01-01 DIAGNOSIS — M9903 Segmental and somatic dysfunction of lumbar region: Secondary | ICD-10-CM | POA: Diagnosis not present

## 2014-01-01 DIAGNOSIS — M5137 Other intervertebral disc degeneration, lumbosacral region: Secondary | ICD-10-CM | POA: Diagnosis not present

## 2014-01-01 DIAGNOSIS — M9902 Segmental and somatic dysfunction of thoracic region: Secondary | ICD-10-CM | POA: Diagnosis not present

## 2014-01-01 DIAGNOSIS — M9904 Segmental and somatic dysfunction of sacral region: Secondary | ICD-10-CM | POA: Diagnosis not present

## 2014-01-01 DIAGNOSIS — M5431 Sciatica, right side: Secondary | ICD-10-CM | POA: Diagnosis not present

## 2014-01-01 DIAGNOSIS — M5415 Radiculopathy, thoracolumbar region: Secondary | ICD-10-CM | POA: Diagnosis not present

## 2014-01-03 DIAGNOSIS — M9902 Segmental and somatic dysfunction of thoracic region: Secondary | ICD-10-CM | POA: Diagnosis not present

## 2014-01-03 DIAGNOSIS — M9903 Segmental and somatic dysfunction of lumbar region: Secondary | ICD-10-CM | POA: Diagnosis not present

## 2014-01-03 DIAGNOSIS — M9904 Segmental and somatic dysfunction of sacral region: Secondary | ICD-10-CM | POA: Diagnosis not present

## 2014-01-03 DIAGNOSIS — M5415 Radiculopathy, thoracolumbar region: Secondary | ICD-10-CM | POA: Diagnosis not present

## 2014-01-03 DIAGNOSIS — M5137 Other intervertebral disc degeneration, lumbosacral region: Secondary | ICD-10-CM | POA: Diagnosis not present

## 2014-01-03 DIAGNOSIS — M5431 Sciatica, right side: Secondary | ICD-10-CM | POA: Diagnosis not present

## 2014-01-15 DIAGNOSIS — M9902 Segmental and somatic dysfunction of thoracic region: Secondary | ICD-10-CM | POA: Diagnosis not present

## 2014-01-15 DIAGNOSIS — M5431 Sciatica, right side: Secondary | ICD-10-CM | POA: Diagnosis not present

## 2014-01-15 DIAGNOSIS — M5415 Radiculopathy, thoracolumbar region: Secondary | ICD-10-CM | POA: Diagnosis not present

## 2014-01-15 DIAGNOSIS — M5137 Other intervertebral disc degeneration, lumbosacral region: Secondary | ICD-10-CM | POA: Diagnosis not present

## 2014-01-15 DIAGNOSIS — M9903 Segmental and somatic dysfunction of lumbar region: Secondary | ICD-10-CM | POA: Diagnosis not present

## 2014-01-15 DIAGNOSIS — M9904 Segmental and somatic dysfunction of sacral region: Secondary | ICD-10-CM | POA: Diagnosis not present

## 2014-01-19 DIAGNOSIS — M5431 Sciatica, right side: Secondary | ICD-10-CM | POA: Diagnosis not present

## 2014-01-19 DIAGNOSIS — M9904 Segmental and somatic dysfunction of sacral region: Secondary | ICD-10-CM | POA: Diagnosis not present

## 2014-01-19 DIAGNOSIS — M5415 Radiculopathy, thoracolumbar region: Secondary | ICD-10-CM | POA: Diagnosis not present

## 2014-01-19 DIAGNOSIS — M9903 Segmental and somatic dysfunction of lumbar region: Secondary | ICD-10-CM | POA: Diagnosis not present

## 2014-01-19 DIAGNOSIS — M9902 Segmental and somatic dysfunction of thoracic region: Secondary | ICD-10-CM | POA: Diagnosis not present

## 2014-01-19 DIAGNOSIS — M5137 Other intervertebral disc degeneration, lumbosacral region: Secondary | ICD-10-CM | POA: Diagnosis not present

## 2014-02-10 DIAGNOSIS — M5137 Other intervertebral disc degeneration, lumbosacral region: Secondary | ICD-10-CM | POA: Diagnosis not present

## 2014-02-10 DIAGNOSIS — M5415 Radiculopathy, thoracolumbar region: Secondary | ICD-10-CM | POA: Diagnosis not present

## 2014-02-10 DIAGNOSIS — M9904 Segmental and somatic dysfunction of sacral region: Secondary | ICD-10-CM | POA: Diagnosis not present

## 2014-02-10 DIAGNOSIS — M5431 Sciatica, right side: Secondary | ICD-10-CM | POA: Diagnosis not present

## 2014-02-10 DIAGNOSIS — M9903 Segmental and somatic dysfunction of lumbar region: Secondary | ICD-10-CM | POA: Diagnosis not present

## 2014-02-10 DIAGNOSIS — M9902 Segmental and somatic dysfunction of thoracic region: Secondary | ICD-10-CM | POA: Diagnosis not present

## 2014-02-14 ENCOUNTER — Encounter: Payer: Medicare Other | Admitting: Internal Medicine

## 2014-02-14 NOTE — Patient Instructions (Signed)
Opened in error, patient no showed today.

## 2014-02-15 DIAGNOSIS — M9904 Segmental and somatic dysfunction of sacral region: Secondary | ICD-10-CM | POA: Diagnosis not present

## 2014-02-15 DIAGNOSIS — M5415 Radiculopathy, thoracolumbar region: Secondary | ICD-10-CM | POA: Diagnosis not present

## 2014-02-15 DIAGNOSIS — M9902 Segmental and somatic dysfunction of thoracic region: Secondary | ICD-10-CM | POA: Diagnosis not present

## 2014-02-15 DIAGNOSIS — M5431 Sciatica, right side: Secondary | ICD-10-CM | POA: Diagnosis not present

## 2014-02-15 DIAGNOSIS — M5137 Other intervertebral disc degeneration, lumbosacral region: Secondary | ICD-10-CM | POA: Diagnosis not present

## 2014-02-15 DIAGNOSIS — M9903 Segmental and somatic dysfunction of lumbar region: Secondary | ICD-10-CM | POA: Diagnosis not present

## 2014-02-20 DIAGNOSIS — M5415 Radiculopathy, thoracolumbar region: Secondary | ICD-10-CM | POA: Diagnosis not present

## 2014-02-20 DIAGNOSIS — M5431 Sciatica, right side: Secondary | ICD-10-CM | POA: Diagnosis not present

## 2014-02-20 DIAGNOSIS — M9902 Segmental and somatic dysfunction of thoracic region: Secondary | ICD-10-CM | POA: Diagnosis not present

## 2014-02-20 DIAGNOSIS — M9904 Segmental and somatic dysfunction of sacral region: Secondary | ICD-10-CM | POA: Diagnosis not present

## 2014-02-20 DIAGNOSIS — M5137 Other intervertebral disc degeneration, lumbosacral region: Secondary | ICD-10-CM | POA: Diagnosis not present

## 2014-02-20 DIAGNOSIS — M9903 Segmental and somatic dysfunction of lumbar region: Secondary | ICD-10-CM | POA: Diagnosis not present

## 2014-02-22 DIAGNOSIS — M9904 Segmental and somatic dysfunction of sacral region: Secondary | ICD-10-CM | POA: Diagnosis not present

## 2014-02-22 DIAGNOSIS — M5415 Radiculopathy, thoracolumbar region: Secondary | ICD-10-CM | POA: Diagnosis not present

## 2014-02-22 DIAGNOSIS — M9902 Segmental and somatic dysfunction of thoracic region: Secondary | ICD-10-CM | POA: Diagnosis not present

## 2014-02-22 DIAGNOSIS — M5137 Other intervertebral disc degeneration, lumbosacral region: Secondary | ICD-10-CM | POA: Diagnosis not present

## 2014-02-22 DIAGNOSIS — M5431 Sciatica, right side: Secondary | ICD-10-CM | POA: Diagnosis not present

## 2014-02-22 DIAGNOSIS — M9903 Segmental and somatic dysfunction of lumbar region: Secondary | ICD-10-CM | POA: Diagnosis not present

## 2014-02-27 ENCOUNTER — Ambulatory Visit: Payer: Medicare Other | Admitting: Internal Medicine

## 2014-02-28 ENCOUNTER — Encounter: Payer: Self-pay | Admitting: Internal Medicine

## 2014-02-28 DIAGNOSIS — R3919 Other difficulties with micturition: Secondary | ICD-10-CM | POA: Diagnosis not present

## 2014-02-28 DIAGNOSIS — R339 Retention of urine, unspecified: Secondary | ICD-10-CM | POA: Diagnosis not present

## 2014-02-28 NOTE — Progress Notes (Unsigned)
Patient ID: Allison [name], female   DOB: July 10, 1934, 80 y.o.   MRN: [id number] The patient's chart has been reviewed by Dr. Carlean Purl  and the recommendations are noted below.     Follow-up advised. Schedule patient for next available appointment. Outcome of communication with the patient: Letter mailed

## 2014-03-03 DIAGNOSIS — M5415 Radiculopathy, thoracolumbar region: Secondary | ICD-10-CM | POA: Diagnosis not present

## 2014-03-03 DIAGNOSIS — M9903 Segmental and somatic dysfunction of lumbar region: Secondary | ICD-10-CM | POA: Diagnosis not present

## 2014-03-03 DIAGNOSIS — M5431 Sciatica, right side: Secondary | ICD-10-CM | POA: Diagnosis not present

## 2014-03-03 DIAGNOSIS — M5137 Other intervertebral disc degeneration, lumbosacral region: Secondary | ICD-10-CM | POA: Diagnosis not present

## 2014-03-03 DIAGNOSIS — M9904 Segmental and somatic dysfunction of sacral region: Secondary | ICD-10-CM | POA: Diagnosis not present

## 2014-03-03 DIAGNOSIS — M9902 Segmental and somatic dysfunction of thoracic region: Secondary | ICD-10-CM | POA: Diagnosis not present

## 2014-03-05 ENCOUNTER — Ambulatory Visit (INDEPENDENT_AMBULATORY_CARE_PROVIDER_SITE_OTHER): Payer: Medicare Other | Admitting: Internal Medicine

## 2014-03-05 ENCOUNTER — Encounter: Payer: Self-pay | Admitting: Internal Medicine

## 2014-03-05 VITALS — BP 152/88 | HR 85 | Temp 97.5°F | Ht 64.0 in | Wt 157.0 lb

## 2014-03-05 DIAGNOSIS — G5601 Carpal tunnel syndrome, right upper limb: Secondary | ICD-10-CM | POA: Diagnosis not present

## 2014-03-05 DIAGNOSIS — E1165 Type 2 diabetes mellitus with hyperglycemia: Secondary | ICD-10-CM

## 2014-03-05 DIAGNOSIS — G5603 Carpal tunnel syndrome, bilateral upper limbs: Secondary | ICD-10-CM

## 2014-03-05 DIAGNOSIS — M5431 Sciatica, right side: Secondary | ICD-10-CM | POA: Diagnosis not present

## 2014-03-05 DIAGNOSIS — R195 Other fecal abnormalities: Secondary | ICD-10-CM

## 2014-03-05 DIAGNOSIS — IMO0002 Reserved for concepts with insufficient information to code with codable children: Secondary | ICD-10-CM

## 2014-03-05 DIAGNOSIS — G5602 Carpal tunnel syndrome, left upper limb: Secondary | ICD-10-CM

## 2014-03-05 DIAGNOSIS — E1129 Type 2 diabetes mellitus with other diabetic kidney complication: Secondary | ICD-10-CM | POA: Diagnosis not present

## 2014-03-05 MED ORDER — GABAPENTIN 100 MG PO CAPS: 100.0000 mg | ORAL_CAPSULE | Freq: Three times a day (TID) | ORAL | Status: DC

## 2014-03-05 NOTE — Progress Notes (Signed)
Patient ID: Allison [name], female   DOB: 09-28-1934, 80 y.o.   MRN: [id number]   Location:  [facility]  Allergies  Allergen Reactions  . Pineapple Anaphylaxis  . Codeine Other (See Comments)    unknown  . Invokana [Canagliflozin] Other (See Comments)    Constipation, nightmares  . Penicillins Other (See Comments)    Unknown   . Rofecoxib Other (See Comments)    unknown    Chief Complaint  Patient presents with  . Acute Visit    Bilateral numbness. left hand is off and on. right hand has been constant for 6-8 weeks    HPI: Patient is a 80 y.o. black female seen in the office today for acute visit due to numbness and tingling of hands.    Is trying to get over sciatica.  Says it will jump on her at times.  Does not want surgery.  Carrying a cane.  Dr. Lorin Mercy follows her.  She is going to a [employer], fast food.    Had allergic reaction to invokana.  Was in hospital 3-4 days.    Hands are going to sleep all of the time.  Doesn't want to lose her hand sensation.  Right middle finger dead at time.  Right one feels like grit in finger tips.  Also some numbness in left.  Sugars have been up and down.  Last hba1c 3 mos ago was 7.6.  Does not have numbness or tingling in her feet.  Sees podiatry regularly every 2-3 mos.  She has upcoming labs 3/11 before her appt with Dr. Bubba Camp.  Says she once used carpal tunnel braces, but thinks she gave them away.    Worked a [employer], then went back to school for education.  Didn't do a lot of typing or small hand work.    Forgot her appt with Dr. Carlean Purl about her blood in her stool.  Thinks it's from cranberry juice and beets.    Review of Systems:  Review of Systems  Musculoskeletal: Positive for back pain.       Sciatica on right  Neurological: Positive for tingling and sensory change.       Bilateral hands left worse than right see hpi     Past Medical History  Diagnosis Date  . SOB (shortness of  breath)   . Cough   . Dysphagia   . Hiatal hernia   . Diabetes mellitus without complication   . Hypertension   . Onychomycosis 03/05/2012  . Pain in lower limb 04/18/2013  . Vertigo   . Sciatic nerve pain     Past Surgical History  Procedure Laterality Date  . Vesicovaginal fistula closure w/ tah  1976  . Partial hysterectomy  1978    Social History:   reports that she has never smoked. She has never used smokeless tobacco. She reports that she does not drink alcohol or use illicit drugs.  Family History  Problem Relation Age of Onset  . Asthma Sister   . Sleep apnea Sister   . Heart disease Sister   . Heart disease Father   . Allergies Sister   . Allergies Sister   . Alzheimer's disease Mother   . Alzheimer's disease Father     Medications: Patient's Medications  New Prescriptions   No medications on file  Previous Medications   AMLODIPINE-BENAZEPRIL (LOTREL) 10-40 MG PER CAPSULE    Take 1 capsule by mouth daily. For elevated blood pressure   ASPIRIN  81 MG TABLET    Take 81 mg by mouth every other day.    ATORVASTATIN (LIPITOR) 10 MG TABLET    Take 1 tablet (10 mg total) by mouth daily.   CHLORTHALIDONE (HYGROTON) 25 MG TABLET    Take 25 mg by mouth daily.    GLYBURIDE-METFORMIN (GLUCOVANCE) 5-500 MG PER TABLET    Take 2 tablets by mouth 2 (two) times daily with a meal.     LEVOTHYROXINE (SYNTHROID, LEVOTHROID) 175 MCG TABLET    Take 1 tablet (175 mcg total) by mouth every morning.   OMEPRAZOLE (PRILOSEC) 40 MG CAPSULE    Take 40 mg by mouth daily. For Acid Reflux   RESTASIS 0.05 % OPHTHALMIC EMULSION    Place 1 drop into both eyes 2 (two) times daily.    VITAMIN E (VITAMIN E) 1000 UNIT CAPSULE    Take 1,000 Units by mouth daily.  Modified Medications   No medications on file  Discontinued Medications   DIPHENHYDRAMINE (BENADRYL) 25 MG TABLET    Take 25 mg by mouth daily as needed for allergies.   TRAMADOL (ULTRAM) 50 MG TABLET    Take 50 mg by mouth 2 (two) times  daily.      Physical Exam: Filed Vitals:   03/05/14 1110  BP: 152/88  Pulse: 85  Temp: 97.5 F (36.4 C)  TempSrc: Oral  Height: 5\' 4"  (1.626 m)  Weight: 157 lb (71.215 kg)  SpO2: 99%  Physical Exam  Constitutional: She is oriented to person, place, and time. She appears well-developed and well-nourished.  Cardiovascular: Normal rate, regular rhythm, normal heart sounds and intact distal pulses.   Pulmonary/Chest: Effort normal and breath sounds normal.  Neurological: She is alert and oriented to person, place, and time.  Positive prayer sign, positive tinel, positive phalen sign    Labs reviewed: Basic Metabolic Panel:  Recent Labs  08/06/13 0449 08/07/13 0750 11/15/13 0812  NA 141 140 140  K 3.7 3.7 3.6  CL 99 103 98  CO2 25 24 28   GLUCOSE 100* 159* 120*  BUN 35* 18 9  CREATININE 2.20* 0.94 0.75  CALCIUM 9.3 9.0 9.9  TSH  --   --  6.540*   Liver Function Tests:  Recent Labs  08/05/13 1540 08/06/13 0449 11/15/13 0812  AST 15 13 13   ALT 15 11 14   ALKPHOS 80 64 63  BILITOT 0.6 0.7 0.6  PROT 8.4* 6.9 6.7  ALBUMIN 4.4 3.6  --     Recent Labs  08/05/13 1540  LIPASE 37   No results for input(s): AMMONIA in the last 8760 hours. CBC:  Recent Labs  08/05/13 1540 08/06/13 0449 08/07/13 0626 11/15/13 0812  WBC 11.6* 11.5* 6.4 8.9  NEUTROABS 7.4 5.8  --  5.4  HGB 15.1* 13.2 12.1 12.4  HCT 42.8 38.9 35.5* 36.7  MCV 79.1 79.1 79.1 81  PLT 305 258 218  --    Lipid Panel:  Recent Labs  11/15/13 0812  HDL 49  LDLCALC 205*  TRIG 141  CHOLHDL 5.8*   Lab Results  Component Value Date   HGBA1C 7.6* 11/15/2013    Assessment/Plan: 1. Bilateral carpal tunnel syndrome -previously used braces years back -suspect this has worsened with poorly controlled diabetes--need hba1c recheck as planned next month -will try her on some gabapentin -does not want nerve conduction study (hates needles) and does not want surgery -may need to get new braces if  ineffective  2. DM type 2, uncontrolled, with renal  complications -cont ace, asa, glucovance, atorvastatin -await labs  3. Positive occult stool blood test -is to reschedule her GI appt with Dr. Carlean Purl that she missed when there was a death in her family  4. Sciatica, right -see if gabapentin helps this  Labs/tests ordered: keep labs as scheduled before appt with Dr. Bubba Camp  Next appt:  Keep in March   Louvenia Golomb L. Alenna Russell, D.O. [facility] [address], [place] [address] Cell Phone (Mon-Fri 8am-5pm):  [phone] On Call:  [phone] & follow prompts after 5pm & weekends Office Phone:  [phone] Office Fax:  [phone]

## 2014-03-05 NOTE — Patient Instructions (Signed)
Carpal Tunnel Syndrome The carpal tunnel is a narrow area located on the palm side of your wrist. The tunnel is formed by the wrist bones and ligaments. Nerves, blood vessels, and tendons pass through the carpal tunnel. Repeated wrist motion or certain diseases may cause swelling within the tunnel. This swelling pinches the main nerve in the wrist (median nerve) and causes the painful hand and arm condition called carpal tunnel syndrome. CAUSES   Repeated wrist motions.  Wrist injuries.  Certain diseases like arthritis, diabetes, alcoholism, hyperthyroidism, and kidney failure.  Obesity.  Pregnancy. SYMPTOMS   A "pins and needles" feeling in your fingers or hand, especially in your thumb, index and middle fingers.  Tingling or numbness in your fingers or hand.  An aching feeling in your entire arm, especially when your wrist and elbow are bent for long periods of time.  Wrist pain that goes up your arm to your shoulder.  Pain that goes down into your palm or fingers.  A weak feeling in your hands. DIAGNOSIS  Your health care provider will take your history and perform a physical exam. An electromyography test may be needed. This test measures electrical signals sent out by your nerves into the muscles. The electrical signals are usually slowed by carpal tunnel syndrome. You may also need X-rays. TREATMENT  Carpal tunnel syndrome may clear up by itself. Your health care provider may recommend a wrist splint or medicine such as a nonsteroidal anti-inflammatory medicine. Cortisone injections may help. Sometimes, surgery may be needed to free the pinched nerve.  HOME CARE INSTRUCTIONS   Take all medicine as directed by your health care provider. Only take over-the-counter or prescription medicines for pain, discomfort, or fever as directed by your health care provider.  If you were given a splint to keep your wrist from bending, wear it as directed. It is important to wear the splint at  night. Wear the splint for as long as you have pain or numbness in your hand, arm, or wrist. This may take 1 to 2 months.  Rest your wrist from any activity that may be causing your pain. If your symptoms are work-related, you may need to talk to your employer about changing to a job that does not require using your wrist.  Put ice on your wrist after long periods of wrist activity.  Put ice in a plastic bag.  Place a towel between your skin and the bag.  Leave the ice on for 15-20 minutes, 03-04 times a day.  Keep all follow-up visits as directed by your health care provider. This includes any orthopedic referrals, physical therapy, and rehabilitation. Any delay in getting necessary care could result in a delay or failure of your condition to heal. SEEK IMMEDIATE MEDICAL CARE IF:   You have new, unexplained symptoms.  Your symptoms get worse and are not helped or controlled with medicines. MAKE SURE YOU:   Understand these instructions.  Will watch your condition.  Will get help right away if you are not doing well or get worse. Document Released: 12/27/1999 Document Revised: 05/15/2013 Document Reviewed: 11/14/2010 ExitCare Patient Information 2015 [employer]. This information is not intended to replace advice given to you by your health care provider. Make sure you discuss any questions you have with your health care provider.  

## 2014-03-08 ENCOUNTER — Encounter: Payer: Self-pay | Admitting: Internal Medicine

## 2014-03-13 ENCOUNTER — Encounter: Payer: Self-pay | Admitting: Podiatry

## 2014-03-13 ENCOUNTER — Ambulatory Visit (INDEPENDENT_AMBULATORY_CARE_PROVIDER_SITE_OTHER): Payer: Medicare Other | Admitting: Podiatry

## 2014-03-13 VITALS — BP 180/95 | HR 82

## 2014-03-13 DIAGNOSIS — M79606 Pain in leg, unspecified: Secondary | ICD-10-CM

## 2014-03-13 DIAGNOSIS — M79673 Pain in unspecified foot: Secondary | ICD-10-CM

## 2014-03-13 DIAGNOSIS — B351 Tinea unguium: Secondary | ICD-10-CM | POA: Diagnosis not present

## 2014-03-13 NOTE — Progress Notes (Signed)
Subjective: 80 year old female presents stating her toes are painful from thick long toe nails.  Right 3,4,5 digits painful and numb. Having problem with Carpal tunnel and Sciatica. Also having spinal problem.   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 Thick dystrophic nails x 10.  Pedal pulses are not palpable.  No edema or erythema noted. No abnormal skin lesions noted.   Assessment: Mycotic nails bilateral. Diabetic Neuropathy. 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 PVD.  Plan: Debrided all nails.  May benefit from diabetic shoes. Return as needed.

## 2014-03-13 NOTE — Patient Instructions (Signed)
Debrided all nails. No new problems noted. Return in 3 months.

## 2014-03-17 DIAGNOSIS — M9903 Segmental and somatic dysfunction of lumbar region: Secondary | ICD-10-CM | POA: Diagnosis not present

## 2014-03-17 DIAGNOSIS — M5415 Radiculopathy, thoracolumbar region: Secondary | ICD-10-CM | POA: Diagnosis not present

## 2014-03-17 DIAGNOSIS — M5431 Sciatica, right side: Secondary | ICD-10-CM | POA: Diagnosis not present

## 2014-03-17 DIAGNOSIS — M9902 Segmental and somatic dysfunction of thoracic region: Secondary | ICD-10-CM | POA: Diagnosis not present

## 2014-03-17 DIAGNOSIS — M5137 Other intervertebral disc degeneration, lumbosacral region: Secondary | ICD-10-CM | POA: Diagnosis not present

## 2014-03-17 DIAGNOSIS — M9904 Segmental and somatic dysfunction of sacral region: Secondary | ICD-10-CM | POA: Diagnosis not present

## 2014-03-23 ENCOUNTER — Other Ambulatory Visit: Payer: Medicare Other

## 2014-03-23 DIAGNOSIS — I10 Essential (primary) hypertension: Secondary | ICD-10-CM

## 2014-03-23 DIAGNOSIS — E1129 Type 2 diabetes mellitus with other diabetic kidney complication: Secondary | ICD-10-CM | POA: Diagnosis not present

## 2014-03-23 DIAGNOSIS — E1165 Type 2 diabetes mellitus with hyperglycemia: Secondary | ICD-10-CM | POA: Diagnosis not present

## 2014-03-23 DIAGNOSIS — E039 Hypothyroidism, unspecified: Secondary | ICD-10-CM

## 2014-03-23 DIAGNOSIS — E785 Hyperlipidemia, unspecified: Secondary | ICD-10-CM | POA: Diagnosis not present

## 2014-03-23 DIAGNOSIS — IMO0002 Reserved for concepts with insufficient information to code with codable children: Secondary | ICD-10-CM

## 2014-03-24 DIAGNOSIS — M5431 Sciatica, right side: Secondary | ICD-10-CM | POA: Diagnosis not present

## 2014-03-24 DIAGNOSIS — M9903 Segmental and somatic dysfunction of lumbar region: Secondary | ICD-10-CM | POA: Diagnosis not present

## 2014-03-24 DIAGNOSIS — M9904 Segmental and somatic dysfunction of sacral region: Secondary | ICD-10-CM | POA: Diagnosis not present

## 2014-03-24 DIAGNOSIS — M5415 Radiculopathy, thoracolumbar region: Secondary | ICD-10-CM | POA: Diagnosis not present

## 2014-03-24 DIAGNOSIS — M5137 Other intervertebral disc degeneration, lumbosacral region: Secondary | ICD-10-CM | POA: Diagnosis not present

## 2014-03-24 DIAGNOSIS — M9902 Segmental and somatic dysfunction of thoracic region: Secondary | ICD-10-CM | POA: Diagnosis not present

## 2014-03-24 LAB — COMPREHENSIVE METABOLIC PANEL
ALT: 11 IU/L (ref 0–32)
AST: 13 IU/L (ref 0–40)
Albumin/Globulin Ratio: 1.7 (ref 1.1–2.5)
Albumin: 4.3 g/dL (ref 3.5–4.8)
Alkaline Phosphatase: 74 IU/L (ref 39–117)
BILIRUBIN TOTAL: 0.5 mg/dL (ref 0.0–1.2)
BUN / CREAT RATIO: 14 (ref 11–26)
BUN: 12 mg/dL (ref 8–27)
CALCIUM: 9.7 mg/dL (ref 8.7–10.3)
CHLORIDE: 98 mmol/L (ref 97–108)
CO2: 25 mmol/L (ref 18–29)
CREATININE: 0.85 mg/dL (ref 0.57–1.00)
GFR, EST AFRICAN AMERICAN: 76 mL/min/{1.73_m2} (ref >59)
GFR, EST NON AFRICAN AMERICAN: 66 mL/min/{1.73_m2} (ref >59)
GLOBULIN, TOTAL: 2.6 g/dL (ref 1.5–4.5)
Glucose: 187 mg/dL — ABNORMAL HIGH (ref 65–99)
POTASSIUM: 3.6 mmol/L (ref 3.5–5.2)
Sodium: 141 mmol/L (ref 134–144)
TOTAL PROTEIN: 6.9 g/dL (ref 6.0–8.5)

## 2014-03-24 LAB — LIPID PANEL
CHOL/HDL RATIO: 6.2 ratio — AB (ref 0.0–4.4)
Cholesterol, Total: 297 mg/dL — ABNORMAL HIGH (ref 100–199)
HDL: 48 mg/dL (ref >39)
LDL Calculated: 215 mg/dL — ABNORMAL HIGH (ref 0–99)
Triglycerides: 168 mg/dL — ABNORMAL HIGH (ref 0–149)
VLDL CHOLESTEROL CAL: 34 mg/dL (ref 5–40)

## 2014-03-24 LAB — HEMOGLOBIN A1C
Est. average glucose Bld gHb Est-mCnc: 217 mg/dL
Hgb A1c MFr Bld: 9.2 % — ABNORMAL HIGH (ref 4.8–5.6)

## 2014-03-24 LAB — TSH: TSH: 4.96 u[IU]/mL — AB (ref 0.450–4.500)

## 2014-03-27 ENCOUNTER — Ambulatory Visit (INDEPENDENT_AMBULATORY_CARE_PROVIDER_SITE_OTHER): Payer: Medicare Other | Admitting: Internal Medicine

## 2014-03-27 ENCOUNTER — Encounter: Payer: Self-pay | Admitting: Internal Medicine

## 2014-03-27 VITALS — BP 126/68 | HR 97 | Temp 97.8°F | Ht 64.0 in | Wt 152.4 lb

## 2014-03-27 DIAGNOSIS — I10 Essential (primary) hypertension: Secondary | ICD-10-CM

## 2014-03-27 DIAGNOSIS — M792 Neuralgia and neuritis, unspecified: Secondary | ICD-10-CM | POA: Diagnosis not present

## 2014-03-27 DIAGNOSIS — E039 Hypothyroidism, unspecified: Secondary | ICD-10-CM

## 2014-03-27 DIAGNOSIS — E1140 Type 2 diabetes mellitus with diabetic neuropathy, unspecified: Secondary | ICD-10-CM

## 2014-03-27 DIAGNOSIS — E1165 Type 2 diabetes mellitus with hyperglycemia: Secondary | ICD-10-CM | POA: Insufficient documentation

## 2014-03-27 DIAGNOSIS — G5602 Carpal tunnel syndrome, left upper limb: Secondary | ICD-10-CM

## 2014-03-27 DIAGNOSIS — K219 Gastro-esophageal reflux disease without esophagitis: Secondary | ICD-10-CM

## 2014-03-27 DIAGNOSIS — Z23 Encounter for immunization: Secondary | ICD-10-CM | POA: Diagnosis not present

## 2014-03-27 DIAGNOSIS — IMO0002 Reserved for concepts with insufficient information to code with codable children: Secondary | ICD-10-CM

## 2014-03-27 DIAGNOSIS — E785 Hyperlipidemia, unspecified: Secondary | ICD-10-CM | POA: Diagnosis not present

## 2014-03-27 DIAGNOSIS — G5601 Carpal tunnel syndrome, right upper limb: Secondary | ICD-10-CM | POA: Diagnosis not present

## 2014-03-27 DIAGNOSIS — G5603 Carpal tunnel syndrome, bilateral upper limbs: Secondary | ICD-10-CM

## 2014-03-27 MED ORDER — ATORVASTATIN CALCIUM 20 MG PO TABS: 20.0000 mg | ORAL_TABLET | Freq: Every day | ORAL | Status: DC

## 2014-03-27 MED ORDER — ASPIRIN EC 81 MG PO TBEC: 81.0000 mg | DELAYED_RELEASE_TABLET | Freq: Every day | ORAL | Status: DC

## 2014-03-27 MED ORDER — AMBULATORY NON FORMULARY MEDICATION: Status: DC

## 2014-03-27 MED ORDER — LINAGLIPTIN 5 MG PO TABS: 5.0000 mg | ORAL_TABLET | Freq: Every day | ORAL | Status: DC

## 2014-03-27 MED ORDER — OMEPRAZOLE 20 MG PO CPDR: 20.0000 mg | DELAYED_RELEASE_CAPSULE | Freq: Every day | ORAL | Status: DC

## 2014-03-27 NOTE — Progress Notes (Signed)
Patient ID: Allison [name], female   DOB: 01-15-1934, 80 y.o.   MRN: [id number]    Chief Complaint  Patient presents with  . Medical Management of Chronic Issues    3 Month follow-up, discuss labs (copy printed). Disucss Chlorthalidone (question if she can d/c)   . Carpal Tunnel    Bilateral, ongoing carpal tunnel concerns    Allergies  Allergen Reactions  . Pineapple Anaphylaxis  . Codeine Other (See Comments)    unknown  . Invokana [Canagliflozin] Other (See Comments)    Constipation, nightmares  . Penicillins Other (See Comments)    Unknown   . Rofecoxib Other (See Comments)    unknown   HPI 80 y/o female patient is here for routine visit.  DM- Did not check her blood sugar this morning. Last checked by her last week was 140. Does not remember her sugar reading but feels her reading are mostly under 200. Taking her tradjenta and glucovance  HTN- complaint with her lotrel and chlorthalidone  gerd- symptom controlled. On prilosec  Hypothyroidism- on levothyroxine and taking it as directed  Carpal tunnel- numbness somewhat better but not resolved, not using braces at present  ROS Constitutional: Negative for fever, chills, weight loss, diaphoresis. Energy level good HENT: Negative for congestion, hearing loss and sore throat.    Eyes: Negative for blurred vision, double vision and discharge. wears glasses. Respiratory: Negative for cough, sputum production, shortness of breath and wheezing.    Cardiovascular: Negative for chest pain, palpitations, orthopnea, leg swelling.   Gastrointestinal: has occasional heartburn. Negative for nausea, vomiting, abdominal pain Genitourinary: Negative for dysuria, urgency, frequency and flank pain. Bladder repair with mesh for bladder prolapse in 2014. Follows with Dr Davis Gourd from [facility] Musculoskeletal: Negative for back pain, falls. History of sciatica, follows with orthopedics Dr Lorin Mercy and tramadol helps.  Skin: Negative for  itching and rash.   Neurological: negative for dizziness, focal weakness and headaches.  Psychiatric/Behavioral: Negative for depression and memory loss. The patient is not nervous/anxious.  Past Medical History  Diagnosis Date  . SOB (shortness of breath)   . Cough   . Dysphagia   . Hiatal hernia   . Diabetes mellitus without complication   . Hypertension   . Onychomycosis 03/05/2012  . Pain in lower limb 04/18/2013  . Vertigo   . Sciatic nerve pain    Current Outpatient Prescriptions on File Prior to Visit  Medication Sig Dispense Refill  . amLODipine-benazepril (LOTREL) 10-40 MG per capsule Take 1 capsule by mouth daily. For elevated blood pressure    . chlorthalidone (HYGROTON) 25 MG tablet Take 25 mg by mouth daily.     Marland Kitchen gabapentin (NEURONTIN) 100 MG capsule Take 1 capsule (100 mg total) by mouth 3 (three) times daily. 90 capsule 3  . glyBURIDE-metformin (GLUCOVANCE) 5-500 MG per tablet Take 2 tablets by mouth 2 (two) times daily with a meal.      . levothyroxine (SYNTHROID, LEVOTHROID) 175 MCG tablet Take 1 tablet (175 mcg total) by mouth every morning. 30 tablet 3  . RESTASIS 0.05 % ophthalmic emulsion Place 1 drop into both eyes 2 (two) times daily.     . vitamin E (VITAMIN E) 1000 UNIT capsule Take 1,000 Units by mouth daily.     No current facility-administered medications on file prior to visit.    Physical exam BP 126/68 mmHg  Pulse 97  Temp(Src) 97.8 F (36.6 C) (Oral)  Ht 5\' 4"  (1.626 m)  Wt 152 lb 6.4 oz (  69.128 kg)  BMI 26.15 kg/m2  SpO2 97%  Wt Readings from Last 3 Encounters:  03/27/14 152 lb 6.4 oz (69.128 kg)  03/05/14 157 lb (71.215 kg)  12/05/13 156 lb 3.2 oz (70.852 kg)   General- elderly female in no acute distress Head- atraumatic, normocephalic Eyes- PERRLA, EOMI, no pallor, no icterus, no discharge Neck- no cervical lymphadenopathy Throat- moist mucus membrane, normal oropharynx, upper and lower dentures   Nose- normal nasal mucosa, no  maxillary or frontal sinus tenderness Cardiovascular- normal s1,s2, no murmurs/ rubs/ gallops, dorsalis pedis palpable Respiratory- bilateral clear to auscultation, no wheeze, no rhonchi, no crackles, no use of accessory muscles Abdomen- bowel sounds present, soft, non tender, no organomegaly, no abdominal bruits, no guarding or rigidity, no CVA tenderness Musculoskeletal- able to move all 4 extremities, no spinal and paraspinal tenderness, steady gait, no use of assistive device, normal range of motion, no leg edema Neurological- no focal deficit, normal foot exam, normal pinprick and vibration Skin- warm and dry Psychiatry- alert and oriented to person, place and time, normal mood and affect  Lab Results  Component Value Date   HGBA1C 9.2* 03/23/2014   CMP Latest Ref Rng 03/23/2014 11/15/2013 08/07/2013  Glucose 65 - 99 mg/dL 187(H) 120(H) 159(H)  BUN 8 - 27 mg/dL 12 9 18   Creatinine 0.57 - 1.00 mg/dL 0.85 0.75 0.94  Sodium 134 - 144 mmol/L 141 140 140  Potassium 3.5 - 5.2 mmol/L 3.6 3.6 3.7  Chloride 97 - 108 mmol/L 98 98 103  CO2 18 - 29 mmol/L 25 28 24   Calcium 8.7 - 10.3 mg/dL 9.7 9.9 9.0  Total Protein 6.0 - 8.5 g/dL 6.9 6.7 -  Albumin 3.5 - 4.8 g/dL 4.3 4.2 -  Total Bilirubin 0.0 - 1.2 mg/dL 0.5 0.6 -  Alkaline Phos 39 - 117 IU/L 74 63 -  AST 0 - 40 IU/L 13 13 -  ALT 0 - 32 IU/L 11 14 -   Lipid Panel     Component Value Date/Time   CHOL 297* 03/23/2014 0851   TRIG 168* 03/23/2014 0851   HDL 48 03/23/2014 0851   CHOLHDL 6.2* 03/23/2014 0851   LDLCALC 215* 03/23/2014 0851   Lab Results  Component Value Date   TSH 4.960* 03/23/2014    Assessment/plan  1. Essential hypertension Continue lotrel 10-40 mg daily and chlorthalidone 25 mg daily. on baby aspirin 81 mg qod, change to EC aspirin 81 mg daily for now - CMP; Future - Hemoglobin A1c; Future  2. Gastroesophageal reflux disease, esophagitis presence not specified Controlled, decrease prilosec to 20 mg daily.  Pending gi appointment for guaiac positive stool for screening colonoscopy in april  3. Hypothyroidism, unspecified hypothyroidism type Improved tsh, continue levothyroxine 175 mcg daily  4. Uncontrolled type 2 diabetes with neuropathy Continue glyburide- metformin 10-998 bid. Add tradjenta 5 mg daily, advised on higher risk of hypoglycemia and thus need to monitor cbg closely. Advised to come to office with her log book for glucose next visit. Continue lipitor increased dosing and aspirin. Negative for microalbuminuria. uptodate on foot exam. uptodate on eye exam - CMP; Future - Hemoglobin A1c; Future  5. Neuropathic pain Gabapentin has been helpful, continue 100 mg tid  6. Hyperlipidemia LDL goal <100 Worsened cholesterol level. Change lipitor to 20 mg daily  7. Carpel tunnel syndrome Discomfort and numbness better with gabapentin, encouraged to use wrist brace. Concern of some from diabetic neuropathy

## 2014-03-27 NOTE — Patient Instructions (Signed)
Check your blood sugar twice a day  You have to call GI office and make your appointment for blood test being positive in your stool  Take your cholesterol pill lipitor every night  Take new diabetes medication once a day  Take baby aspirin every day  Make sure you see your eye doctor once a year for diabetic eye exam

## 2014-03-29 ENCOUNTER — Telehealth: Payer: Self-pay

## 2014-03-29 ENCOUNTER — Emergency Department (HOSPITAL_COMMUNITY)
Admission: EM | Admit: 2014-03-29 | Discharge: 2014-03-29 | Disposition: A | Discharge status: Home / Self Care | Payer: Medicare Other | Attending: Emergency Medicine | Admitting: Emergency Medicine

## 2014-03-29 ENCOUNTER — Ambulatory Visit: Payer: Medicare Other | Admitting: Nurse Practitioner

## 2014-03-29 ENCOUNTER — Encounter (HOSPITAL_COMMUNITY): Payer: Self-pay | Admitting: *Deleted

## 2014-03-29 DIAGNOSIS — I10 Essential (primary) hypertension: Secondary | ICD-10-CM | POA: Diagnosis not present

## 2014-03-29 DIAGNOSIS — B9689 Other specified bacterial agents as the cause of diseases classified elsewhere: Secondary | ICD-10-CM | POA: Diagnosis not present

## 2014-03-29 DIAGNOSIS — K1379 Other lesions of oral mucosa: Secondary | ICD-10-CM | POA: Insufficient documentation

## 2014-03-29 DIAGNOSIS — Z7982 Long term (current) use of aspirin: Secondary | ICD-10-CM | POA: Diagnosis not present

## 2014-03-29 DIAGNOSIS — N390 Urinary tract infection, site not specified: Secondary | ICD-10-CM | POA: Diagnosis not present

## 2014-03-29 DIAGNOSIS — Z0289 Encounter for other administrative examinations: Secondary | ICD-10-CM

## 2014-03-29 DIAGNOSIS — Z880 Allergy status to penicillin: Secondary | ICD-10-CM | POA: Diagnosis not present

## 2014-03-29 DIAGNOSIS — Z79899 Other long term (current) drug therapy: Secondary | ICD-10-CM | POA: Insufficient documentation

## 2014-03-29 DIAGNOSIS — Z8619 Personal history of other infectious and parasitic diseases: Secondary | ICD-10-CM | POA: Diagnosis not present

## 2014-03-29 DIAGNOSIS — K1370 Unspecified lesions of oral mucosa: Secondary | ICD-10-CM

## 2014-03-29 DIAGNOSIS — Z8719 Personal history of other diseases of the digestive system: Secondary | ICD-10-CM | POA: Insufficient documentation

## 2014-03-29 DIAGNOSIS — E119 Type 2 diabetes mellitus without complications: Secondary | ICD-10-CM | POA: Insufficient documentation

## 2014-03-29 LAB — URINE MICROSCOPIC-ADD ON

## 2014-03-29 LAB — URINALYSIS, ROUTINE W REFLEX MICROSCOPIC
Bilirubin Urine: NEGATIVE
Glucose, UA: NEGATIVE mg/dL
Hgb urine dipstick: NEGATIVE
KETONES UR: NEGATIVE mg/dL
Nitrite: NEGATIVE
Protein, ur: NEGATIVE mg/dL
Specific Gravity, Urine: 1.014 (ref 1.005–1.030)
Urobilinogen, UA: 0.2 mg/dL (ref 0.0–1.0)
pH: 5 (ref 5.0–8.0)

## 2014-03-29 MED ORDER — CEPHALEXIN 500 MG PO CAPS: 500.0000 mg | ORAL_CAPSULE | Freq: Two times a day (BID) | ORAL | Status: DC

## 2014-03-29 MED ORDER — DIPHENHYDRAMINE HCL 25 MG PO CAPS
25.0000 mg | ORAL_CAPSULE | Freq: Once | ORAL | Status: AC
  Administered 2014-03-29: 25 mg via ORAL
  Filled 2014-03-29: qty 1

## 2014-03-29 NOTE — ED Notes (Signed)
PT placed in gown and in bed. Pt monitored by pulse ox and bp cuff. 

## 2014-03-29 NOTE — Discharge Instructions (Signed)
Please follow up with your primary care physician in 1-2 days. If you do not have one please call the [facility] number listed above. Please take your antibiotic until completion. Please read all discharge instructions and return precautions.   Urinary Tract Infection Urinary tract infections (UTIs) can develop anywhere along your urinary tract. Your urinary tract is your body's drainage system for removing wastes and extra water. Your urinary tract includes two kidneys, two ureters, a bladder, and a urethra. Your kidneys are a pair of bean-shaped organs. Each kidney is about the size of your fist. They are located below your ribs, one on each side of your spine. CAUSES Infections are caused by microbes, which are microscopic organisms, including fungi, viruses, and bacteria. These organisms are so small that they can only be seen through a microscope. Bacteria are the microbes that most commonly cause UTIs. SYMPTOMS  Symptoms of UTIs may vary by age and gender of the patient and by the location of the infection. Symptoms in young women typically include a frequent and intense urge to urinate and a painful, burning feeling in the bladder or urethra during urination. Older women and men are more likely to be tired, shaky, and weak and have muscle aches and abdominal pain. A fever may mean the infection is in your kidneys. Other symptoms of a kidney infection include pain in your back or sides below the ribs, nausea, and vomiting. DIAGNOSIS To diagnose a UTI, your caregiver will ask you about your symptoms. Your caregiver also will ask to provide a urine sample. The urine sample will be tested for bacteria and white blood cells. White blood cells are made by your body to help fight infection. TREATMENT  Typically, UTIs can be treated with medication. Because most UTIs are caused by a bacterial infection, they usually can be treated with the use of antibiotics. The choice of antibiotic  and length of treatment depend on your symptoms and the type of bacteria causing your infection. HOME CARE INSTRUCTIONS  If you were prescribed antibiotics, take them exactly as your caregiver instructs you. Finish the medication even if you feel better after you have only taken some of the medication.  Drink enough water and fluids to keep your urine clear or pale yellow.  Avoid caffeine, tea, and carbonated beverages. They tend to irritate your bladder.  Empty your bladder often. Avoid holding urine for long periods of time.  Empty your bladder before and after sexual intercourse.  After a bowel movement, women should cleanse from front to back. Use each tissue only once. SEEK MEDICAL CARE IF:   You have back pain.  You develop a fever.  Your symptoms do not begin to resolve within 3 days. SEEK IMMEDIATE MEDICAL CARE IF:   You have severe back pain or lower abdominal pain.  You develop chills.  You have nausea or vomiting.  You have continued burning or discomfort with urination. MAKE SURE YOU:   Understand these instructions.  Will watch your condition.  Will get help right away if you are not doing well or get worse. Document Released: 10/08/2004 Document Revised: 06/30/2011 Document Reviewed: 02/06/2011 [facility] Patient Information 2015 [place], [place]. This information is not intended to replace advice given to you by your health care provider. Make sure you discuss any questions you have with your health care provider. Sore or Dry Mouth Care A sore or dry mouth may happen for many different reasons. Sometimes, treatment for other health problems may  have to stop until your sore or dry mouth gets better.  HOME CARE  Do not smoke or chew tobacco.  Use fake (artificial) saliva when your mouth feels dry.  Use a humidifier in your bedroom at night.  Eat small meals and snacks.  Eat food cold or at room temperature.  Suck on ice-chips or try frozen ice pops or  juice bars, ice-cream, and watermelon. Do not have citrus flavors.  Suck on hard, sugarless, sour candy, or chew sugarless gum to help make more saliva.  Eat soft foods such as yogurt, bananas, canned fruit, mashed potatoes, oatmeal, rice, eggs, cottage cheese, macaroni and cheese, jello, and pudding.  Microwave vegetables and fruits to soften them.  Puree cooked food in a blender if needed.  Make dry food moist by using olive oil, gravy, or mild sauces. Dip foods in liquids.  Keep a glass of water or squirt bottle nearby. Take sips often throughout the day.  Limit caffeine.  Avoid:  Pop or fizzy drinks.  Alcohol.  Citrus juices.  Acidic food.  Salty or spicy food.  Foods or drinks that are very hot.  Hard or crunchy food. Mouth Care  Wash your hands well with soap and water before doing mouth care.  Use fake saliva as told by your doctor.  Use medicine on the sore places.  Brush your teeth at least 2 times a day. Brush after each meal if possible. Rinse your mouth with water after each meal and after drinking a sweet drink.  Brush slowly and gently in small circles. Do not brush side-to-side.  Use regular toothpastes, but stay away from ones that have sodium laurel sulfate in them.  Gargle with a baking soda mouthwash ( teaspoon baking soda mixed in with 4 cups of water).  Gargle with medicated mouthwash.  Use dental floss or dental tape to clean between your teeth every day.  Use a lanolin-based lip balm to keep your lips from getting dry.  If you wear dentures or bridges:  You may need to leave them out until your doctor tells you to start wearing them again.  Take them out at night if you wear them daily. Soak them in warm water or denture solution. Take your dentures out as much as you can during the day. Take them out when you use mouthwash.  After each meal, brush your gums gently with a soft brush and rinse your mouth with water.  If your dentures  rub on your gums and cause a sore spot, have your dentist check and fix your dentures right away. GET HELP RIGHT AWAY IF:   Your mouth gets more painful or dry.  You have questions. MAKE SURE YOU:  Understand these instructions.  Will watch your condition.  Will get help right away if you are not doing well or get worse. Document Released: 10/26/2008 Document Revised: 03/23/2011 Document Reviewed: 10/26/2008 [facility] Patient Information 2015 [place], [place]. This information is not intended to replace advice given to you by your health care provider. Make sure you discuss any questions you have with your health care provider.

## 2014-03-29 NOTE — Telephone Encounter (Signed)
Patient arrived at the office 30 minutes late for her appointment today, patient was told she could still be seen, there would be a wait due to her late arrival, patient refused to wait and be seen.   Patient was offered a later appointment at 2:00 pm, urgent care or emergency room.  Patient indicated she will take benadryl and go home.   This situation was handled by Rudene Re (front desk registrar) and Sherrie Mustache, NP

## 2014-03-29 NOTE — ED Notes (Signed)
Pt in from home c/o L lower lip swelling & dry mouth onset today, pt reports taking new DM oral medicine unable to state name of medicine, pt reports receiving pna vx x 2 days ago, pt denies SOB, airway intact, pt speaking in complete sentences, NAD

## 2014-03-29 NOTE — ED Notes (Signed)
Wheelchair brought to room for pt's discharge

## 2014-03-29 NOTE — ED Provider Notes (Signed)
CSN: [id number]     Arrival date & time 03/29/14  6606 History   First MD Initiated Contact with Patient 03/29/14 [phone]     Chief Complaint  Patient presents with  . Oral Swelling  . Allergic Reaction     (Consider location/radiation/quality/duration/timing/severity/associated sxs/prior Treatment) HPI Comments: Patient is a 80 yo F presenting to the ED for two complaints from [facility]. Patient states she awoke this morning at 4:47AM and noticed her left lower lip was sore and swollen. She was unsure if this was from starting her new diabetic medication or perhaps from receiving her pneumococcal vaccination two days ago. She did not take any medications for her symptoms. Patient is also complaining about dark and smelling urine after the last few days. She denies any SOB, sensation her throat is closing, tongue swelling, nausea, vomiting, diarrhea, abdominal pain, CP.   Patient is a 80 y.o. female presenting with allergic reaction.  Allergic Reaction   Past Medical History  Diagnosis Date  . SOB (shortness of breath)   . Cough   . Dysphagia   . Hiatal hernia   . Diabetes mellitus without complication   . Hypertension   . Onychomycosis 03/05/2012  . Pain in lower limb 04/18/2013  . Vertigo   . Sciatic nerve pain    Past Surgical History  Procedure Laterality Date  . Vesicovaginal fistula closure w/ tah  1976  . Partial hysterectomy  1978   Family History  Problem Relation Age of Onset  . Asthma Sister   . Sleep apnea Sister   . Heart disease Sister   . Heart disease Father   . Allergies Sister   . Allergies Sister   . Alzheimer's disease Mother   . Alzheimer's disease Father    History  Substance Use Topics  . Smoking status: Never Smoker   . Smokeless tobacco: Never Used  . Alcohol Use: No   OB History    No data available     Review of Systems  HENT: Positive for facial swelling.   Genitourinary:       Dark urine  All other systems reviewed and are  negative.     Allergies  Pineapple; Codeine; Invokana; Penicillins; and Rofecoxib  Home Medications   Prior to Admission medications   Medication Sig Start Date End Date Taking? Authorizing Provider  AMBULATORY NON FORMULARY MEDICATION Apply to your wrist daily 03/27/14   Blanchie Serve, MD  amLODipine-benazepril (LOTREL) 10-40 MG per capsule Take 1 capsule by mouth daily. For elevated blood pressure    Historical Provider, MD  aspirin EC 81 MG tablet Take 1 tablet (81 mg total) by mouth daily. 03/27/14   Blanchie Serve, MD  atorvastatin (LIPITOR) 20 MG tablet Take 1 tablet (20 mg total) by mouth daily. 03/27/14   Blanchie Serve, MD  cephALEXin (KEFLEX) 500 MG capsule Take 1 capsule (500 mg total) by mouth 2 (two) times daily. 03/29/14   Esdras Delair, PA-C  chlorthalidone (HYGROTON) 25 MG tablet Take 25 mg by mouth daily.  10/10/12   Historical Provider, MD  gabapentin (NEURONTIN) 100 MG capsule Take 1 capsule (100 mg total) by mouth 3 (three) times daily. 03/05/14   [name] Reed, DO  glyBURIDE-metformin (GLUCOVANCE) 5-500 MG per tablet Take 2 tablets by mouth 2 (two) times daily with a meal.      Historical Provider, MD  levothyroxine (SYNTHROID, LEVOTHROID) 175 MCG tablet Take 1 tablet (175 mcg total) by mouth every morning. 12/05/13   Blanchie Serve, MD  linagliptin (TRADJENTA) 5 MG TABS tablet Take 1 tablet (5 mg total) by mouth daily. 03/27/14   Blanchie Serve, MD  omeprazole (PRILOSEC) 20 MG capsule Take 1 capsule (20 mg total) by mouth daily. 03/27/14   Mahima Pandey, MD  RESTASIS 0.05 % ophthalmic emulsion Place 1 drop into both eyes 2 (two) times daily.  01/09/13   Historical Provider, MD  vitamin E (VITAMIN E) 1000 UNIT capsule Take 1,000 Units by mouth daily.    Historical Provider, MD   BP 122/64 mmHg  Pulse 68  Temp(Src) 97.7 F (36.5 C) (Oral)  Resp 18  Ht 5\' 4"  (1.626 m)  Wt 152 lb (68.947 kg)  BMI 26.08 kg/m2  SpO2 98% Physical Exam  Constitutional: She is oriented to  person, place, and time. She appears well-developed and well-nourished. No distress.  HENT:  Head: Normocephalic and atraumatic.  Right Ear: External ear normal.  Left Ear: External ear normal.  Nose: Nose normal.  Mouth/Throat: Uvula is midline and oropharynx is clear and moist. No uvula swelling. No oropharyngeal exudate.    Eyes: Conjunctivae are normal.  Neck: Normal range of motion. Neck supple.  Cardiovascular: Normal rate, regular rhythm and normal heart sounds.   Pulmonary/Chest: Effort normal and breath sounds normal. No respiratory distress. She has no wheezes.  Abdominal: Soft.  Musculoskeletal: Normal range of motion.  Neurological: She is alert and oriented to person, place, and time.  Skin: Skin is warm and dry. She is not diaphoretic.  Psychiatric: She has a normal mood and affect.  Nursing note and vitals reviewed.   ED Course  Procedures (including critical care time) Medications  diphenhydrAMINE (BENADRYL) capsule 25 mg (25 mg Oral Given 03/29/14 1043)    Labs Review Labs Reviewed  URINALYSIS, ROUTINE W REFLEX MICROSCOPIC - Abnormal; Notable for the following:    APPearance CLOUDY (*)    Leukocytes, UA MODERATE (*)    All other components within normal limits  URINE MICROSCOPIC-ADD ON    Imaging Review No results found.   EKG Interpretation None      Patient requesting benadryl.  MDM   Final diagnoses:  Oral lesion  UTI (lower urinary tract infection)    Filed Vitals:   03/29/14 1154  BP:   Pulse:   Temp: 97.7 F (36.5 C)  Resp:    Afebrile, NAD, non-toxic appearing, AAOx4.   1) Oral lesion: Patient presenting with left lower lip swelling. Ulceration noted on mucosa of left lower lip, mildly swollen, mildly TTP. No bleeding or drainage. Patient re-evaluated prior to dc, is hemodynamically stable, in no respiratory distress, and denies the feeling of throat closing.   2) UTI: Pt has been diagnosed with a UTI. Pt is afebrile, no CVA  tenderness, normotensive, and denies N/V. Pt to be dc home with antibiotics.  Return precautions discussed. Pt is to follow up with their PCP. Pt is agreeable with plan & verbalizes understanding.  Patient d/w with Dr. Johnney Killian, agrees with plan.     Baron Sane, PA-C 03/29/14 [place], MD 03/31/14 1544

## 2014-04-03 ENCOUNTER — Telehealth: Payer: Self-pay

## 2014-04-03 NOTE — Telephone Encounter (Signed)
Patient mailed in letter addressed to Dr.Pandey:  " I want to know if I am suppose to take these diabetic medicines"  1.) Glyburide-metformin 5-500 mg (currently taking) 2.) Glimepiride 4 mg (not taking) 3.) Tradjenta (currently taking)  Last A1c 9.2 (03/23/14), Last OV 03/27/14  I spoke with patient, patient aware per medication list she is to take #1 and  #3, patient verbalized understanding.

## 2014-04-03 NOTE — Telephone Encounter (Signed)
Reviewed chart, to take glyburide-metformin 5-500 mg 2 tab twice a day. Is patient taking it this way?

## 2014-04-04 NOTE — Telephone Encounter (Signed)
Yes patient is taking correctly she stated.

## 2014-04-16 DIAGNOSIS — H40033 Anatomical narrow angle, bilateral: Secondary | ICD-10-CM | POA: Diagnosis not present

## 2014-04-16 DIAGNOSIS — H2513 Age-related nuclear cataract, bilateral: Secondary | ICD-10-CM | POA: Diagnosis not present

## 2014-04-30 ENCOUNTER — Encounter: Payer: Self-pay | Admitting: Internal Medicine

## 2014-04-30 ENCOUNTER — Ambulatory Visit (INDEPENDENT_AMBULATORY_CARE_PROVIDER_SITE_OTHER): Payer: Medicare Other | Admitting: Internal Medicine

## 2014-04-30 VITALS — BP 128/70 | HR 100 | Ht 62.25 in | Wt 150.5 lb

## 2014-04-30 DIAGNOSIS — R195 Other fecal abnormalities: Secondary | ICD-10-CM

## 2014-04-30 NOTE — Patient Instructions (Signed)
You have been scheduled for a colonoscopy. Please follow written instructions given to you at your visit today.  Please pick up your prep supplies at the pharmacy within the next 1-3 days. If you use inhalers (even only as needed), please bring them with you on the day of your procedure. Your physician has requested that you go to www.startemmi.com and enter the access code given to you at your visit today. This web site gives a general overview about your procedure. However, you should still follow specific instructions given to you by our office regarding your preparation for the procedure. CC:  Mahima Bubba Camp

## 2014-04-30 NOTE — Progress Notes (Signed)
  Referred by Dr. Bubba Camp Subjective:    Patient ID: Allison [name], female    DOB: 09/21/1934, 80 y.o.   MRN: [id number] Cc: heme + stool HPI The patient is here to be evaluated because of iFOBT + stool. She says stools are sometimes dark after eating greens but no clear melena and no hematochezia.  Takes omeprazole for GERD with good relief. She has a hx of hoarseness relieved by vocal cord surgery. She was unable to shout BINGO and lost out on a $400 prize before that surgery. GI ROS o/w negative.  Medications, allergies, past medical history, past surgical history, family history and social history are reviewed and updated in the EMR.  Review of Systems As per HPI + sciatica, occasional night sweats, bilateral wrist pain from carpal tunnel syndrome. All other ROS negative    Objective:   Physical Exam @BP  128/70 mmHg  Pulse 100  Ht 5' 2.25" (1.581 m)  Wt 150 lb 8 oz (68.266 kg)  BMI 27.31 kg/m2@  General:  Well-developed, well-nourished and in no acute distress Eyes:  anicteric.  Lungs: Clear to auscultation bilaterally. Heart:  S1S2, no rubs, murmurs, gallops. Abdomen:  soft, non-tender, no hepatosplenomegaly, hernia, or mass and BS+.  Rectal: Deferred until colonoscopy Extremities:   no edema, cyanosis or clubbing. Psych:  appropriate mood and  Affect.   Data Reviewed:  PCP [phone] Labs in EMR 2015-16 NL CBC 11/2013 Hgb A1c 9.2%      Assessment & Plan:  Heme + stool - Plan: Ambulatory referral to Gastroenterology   Will schedule colonoscopy to evaluate iFOBT + stool. The risks and benefits as well as alternatives of endoscopic procedure(s) have been discussed and reviewed. All questions answered. The patient agrees to proceed.  I appreciate the opportunity to care for her. OP:FYTWKM, MAHIMA, MD

## 2014-05-01 ENCOUNTER — Encounter: Payer: Self-pay | Admitting: Internal Medicine

## 2014-05-08 ENCOUNTER — Encounter: Payer: Self-pay | Admitting: Internal Medicine

## 2014-05-11 ENCOUNTER — Encounter: Payer: Self-pay | Admitting: Internal Medicine

## 2014-05-11 ENCOUNTER — Ambulatory Visit (INDEPENDENT_AMBULATORY_CARE_PROVIDER_SITE_OTHER): Payer: Medicare Other | Admitting: Internal Medicine

## 2014-05-11 VITALS — BP 138/84 | HR 87 | Temp 97.7°F | Resp 20 | Ht 62.0 in | Wt 150.0 lb

## 2014-05-11 DIAGNOSIS — J209 Acute bronchitis, unspecified: Secondary | ICD-10-CM

## 2014-05-11 DIAGNOSIS — E1129 Type 2 diabetes mellitus with other diabetic kidney complication: Secondary | ICD-10-CM | POA: Diagnosis not present

## 2014-05-11 DIAGNOSIS — IMO0002 Reserved for concepts with insufficient information to code with codable children: Secondary | ICD-10-CM

## 2014-05-11 DIAGNOSIS — E1165 Type 2 diabetes mellitus with hyperglycemia: Secondary | ICD-10-CM

## 2014-05-11 DIAGNOSIS — J301 Allergic rhinitis due to pollen: Secondary | ICD-10-CM

## 2014-05-11 MED ORDER — ALBUTEROL SULFATE (2.5 MG/3ML) 0.083% IN NEBU
2.5000 mg | INHALATION_SOLUTION | Freq: Once | RESPIRATORY_TRACT | Status: AC
  Administered 2014-05-11: 2.5 mg via RESPIRATORY_TRACT

## 2014-05-11 MED ORDER — AZITHROMYCIN 250 MG PO TABS: 250.0000 mg | ORAL_TABLET | Freq: Every day | ORAL | Status: DC

## 2014-05-11 MED ORDER — ALBUTEROL SULFATE HFA 108 (90 BASE) MCG/ACT IN AERS: 2.0000 | INHALATION_SPRAY | RESPIRATORY_TRACT | Status: DC | PRN

## 2014-05-11 NOTE — Progress Notes (Signed)
Patient ID: Allison [name], female   DOB: [birth year], 80 y.o.   MRN: [id number]    Location:     [facility]   Place of Service:   OFFICE    Chief Complaint  Patient presents with  . Acute Visit    Paitent c/osore throat, discuss labs ( copy printed)            HPI:  80 yo female seen today for sore throat. She had associated ear pain and chest congestion. She has cough with thick white sputum pdtn. (+) rhinorrhea, sinus pressure, hot flashes at night, interruption of sleep. No f/c, HA, dizziness, SOB, change inappetite. Sick contacts with similar sx's. She has grandchildren with URI.  She has hx vertigo but sx's are different  She has DM. Last A1c 9.2% in March 2016. Takes metformin. invokana caused allergic reactions. She has a fear of needles and refuses insulin  Past Medical History  Diagnosis Date  . SOB (shortness of breath)   . Cough   . Dysphagia   . Hiatal hernia   . Diabetes mellitus without complication   . Hypertension   . Onychomycosis 03/05/2012  . Pain in lower limb 04/18/2013  . Vertigo   . Sciatic nerve pain   . GERD (gastroesophageal reflux disease)   . HLD (hyperlipidemia)   . Hypothyroidism   . Carpal tunnel syndrome     Past Surgical History  Procedure Laterality Date  . Vesicovaginal fistula closure w/ tah  1976  . Partial hysterectomy  1978  . Bladder surgery      Mesh implant  . Colonoscopy    . Larynx surgery      vocal cords    Patient Care Team: Gildardo Cranker, DO as PCP - General (Internal Medicine)  History   Social History  . Marital Status: Single    Spouse Name: N/A  . Number of Children: 2  . Years of Education: N/A   Occupational History  . Retired    Social History Main Topics  . Smoking status: Never Smoker   . Smokeless tobacco: Never Used  . Alcohol Use: No  . Drug Use: No  . Sexual Activity: No   Other Topics Concern  . Not on file   Social History Narrative   Single   2 sons   Retired   2 cups caffeine daily     05/01/2014        reports that she has never smoked. She has never used smokeless tobacco. She reports that she does not drink alcohol or use illicit drugs.  Allergies  Allergen Reactions  . Pineapple Anaphylaxis  . Codeine Other (See Comments)    unknown  . Invokana [Canagliflozin] Other (See Comments)    Constipation, nightmares  . Penicillins Other (See Comments)    Unknown   . Vioxx [Rofecoxib] Other (See Comments)    unknown    Medications: Patient's Medications  New Prescriptions   No medications on file  Previous Medications   AMBULATORY NON FORMULARY MEDICATION    Apply to your wrist daily   AMLODIPINE-BENAZEPRIL (LOTREL) 10-40 MG PER CAPSULE    Take 1 capsule by mouth daily. For elevated blood pressure   ASPIRIN EC 81 MG TABLET    Take 1 tablet (81 mg total) by mouth daily.   ATORVASTATIN (LIPITOR) 20 MG TABLET    Take 1 tablet (20 mg total) by mouth daily.   CHLORTHALIDONE (HYGROTON) 25 MG TABLET    Take 25 mg by  mouth daily.    GABAPENTIN (NEURONTIN) 100 MG CAPSULE    Take 1 capsule (100 mg total) by mouth 3 (three) times daily.   GLYBURIDE-METFORMIN (GLUCOVANCE) 5-500 MG PER TABLET    Take 2 tablets by mouth 2 (two) times daily with a meal.     LEVOTHYROXINE (SYNTHROID, LEVOTHROID) 175 MCG TABLET    Take 1 tablet (175 mcg total) by mouth every morning.   LINAGLIPTIN (TRADJENTA) 5 MG TABS TABLET    Take 1 tablet (5 mg total) by mouth daily.   OMEPRAZOLE (PRILOSEC) 20 MG CAPSULE    Take 1 capsule (20 mg total) by mouth daily.   RESTASIS 0.05 % OPHTHALMIC EMULSION    Place 1 drop into both eyes 2 (two) times daily.    VITAMIN E (VITAMIN E) 1000 UNIT CAPSULE    Take 1,000 Units by mouth daily.  Modified Medications   No medications on file  Discontinued Medications   CEPHALEXIN (KEFLEX) 500 MG CAPSULE    Take 1 capsule (500 mg total) by mouth 2 (two) times daily.    Review of Systems  Constitutional: Negative for fever, chills, diaphoresis, activity change,  appetite change and fatigue.  HENT: Positive for ear pain, facial swelling, postnasal drip, rhinorrhea, sinus pressure, sneezing and voice change. Negative for sore throat.   Eyes: Negative for visual disturbance.  Respiratory: Positive for cough. Negative for chest tightness, shortness of breath and wheezing.   Cardiovascular: Negative for chest pain, palpitations and leg swelling.  Gastrointestinal: Negative for nausea, vomiting, abdominal pain, diarrhea, constipation and blood in stool.  Genitourinary: Negative for dysuria.  Musculoskeletal: Negative for arthralgias.  Neurological: Negative for dizziness, tremors, numbness and headaches.  Psychiatric/Behavioral: Negative for sleep disturbance. The patient is not nervous/anxious.     Filed Vitals:   05/11/14 1515  BP: 138/84  Pulse: 87  Temp: 97.7 F (36.5 C)  TempSrc: Oral  Resp: 20  Height: 5\' 2"  (1.575 m)  Weight: 150 lb (68.04 kg)  SpO2: 99%   Body mass index is 27.43 kg/(m^2).  Physical Exam  Constitutional: She appears well-developed and well-nourished. No distress.  Looks ill in NAD  HENT:  TMs intact b/l but no redness or bulging. No sinus TTP. Nares with enlarged grey dry turbinates. Oropharynx cobblestoning and red but no exudate  Eyes: Pupils are equal, round, and reactive to light. Right eye exhibits no discharge. Left eye exhibits no discharge. No scleral icterus.  Neck: Neck supple.   NT  Cardiovascular: Normal rate, regular rhythm and intact distal pulses.   Murmur (1/6SEM) heard. Pulmonary/Chest: She has wheezes (end expiratory  with cough).  Lymphadenopathy:       Head (right side): No submandibular, no posterior auricular and no occipital adenopathy present.       Head (left side): No submandibular, no posterior auricular and no occipital adenopathy present.    She has cervical adenopathy (R>L).       Right: No supraclavicular adenopathy present.       Left: No supraclavicular adenopathy present.      Labs reviewed: Admission on 03/29/2014, Discharged on 03/29/2014  Component Date Value Ref Range Status  . Color, Urine 03/29/2014 YELLOW  YELLOW Final  . APPearance 03/29/2014 CLOUDY* CLEAR Final  . Specific Gravity, Urine 03/29/2014 1.014  1.005 - 1.030 Final  . pH 03/29/2014 5.0  5.0 - 8.0 Final  . Glucose, UA 03/29/2014 NEGATIVE  NEGATIVE mg/dL Final  . Hgb urine dipstick 03/29/2014 NEGATIVE  NEGATIVE Final  . Bilirubin  Urine 03/29/2014 NEGATIVE  NEGATIVE Final  . Ketones, ur 03/29/2014 NEGATIVE  NEGATIVE mg/dL Final  . Protein, ur 03/29/2014 NEGATIVE  NEGATIVE mg/dL Final  . Urobilinogen, UA 03/29/2014 0.2  0.0 - 1.0 mg/dL Final  . Nitrite 03/29/2014 NEGATIVE  NEGATIVE Final  . Leukocytes, UA 03/29/2014 MODERATE* NEGATIVE Final  . Squamous Epithelial / LPF 03/29/2014 RARE  RARE Final  . WBC, UA 03/29/2014 11-20  <3 WBC/hpf Final  . Bacteria, UA 03/29/2014 RARE  RARE Final  Appointment on 03/23/2014  Component Date Value Ref Range Status  . Glucose 03/23/2014 187* 65 - 99 mg/dL Final  . BUN 03/23/2014 12  8 - 27 mg/dL Final  . Creatinine, Ser 03/23/2014 0.85  0.57 - 1.00 mg/dL Final  . GFR calc non Af Amer 03/23/2014 66  >59 mL/min/1.73 Final  . GFR calc Af Amer 03/23/2014 76  >59 mL/min/1.73 Final  . BUN/Creatinine Ratio 03/23/2014 14  11 - 26 Final  . Sodium 03/23/2014 141  134 - 144 mmol/L Final  . Potassium 03/23/2014 3.6  3.5 - 5.2 mmol/L Final  . Chloride 03/23/2014 98  97 - 108 mmol/L Final  . CO2 03/23/2014 25  18 - 29 mmol/L Final  . Calcium 03/23/2014 9.7  8.7 - 10.3 mg/dL Final  . Total Protein 03/23/2014 6.9  6.0 - 8.5 g/dL Final  . Albumin 03/23/2014 4.3  3.5 - 4.8 g/dL Final  . Globulin, Total 03/23/2014 2.6  1.5 - 4.5 g/dL Final  . Albumin/Globulin Ratio 03/23/2014 1.7  1.1 - 2.5 Final  . Bilirubin Total 03/23/2014 0.5  0.0 - 1.2 mg/dL Final  . Alkaline Phosphatase 03/23/2014 74  39 - 117 IU/L Final  . AST 03/23/2014 13  0 - 40 IU/L Final  . ALT  03/23/2014 11  0 - 32 IU/L Final  . Cholesterol, Total 03/23/2014 297* 100 - 199 mg/dL Final  . Triglycerides 03/23/2014 168* 0 - 149 mg/dL Final  . HDL 03/23/2014 48  >39 mg/dL Final   Comment: According to ATP-III Guidelines, HDL-C >59 mg/dL is considered a negative risk factor for CHD.   Marland Kitchen VLDL Cholesterol Cal 03/23/2014 34  5 - 40 mg/dL Final  . LDL Calculated 03/23/2014 215* 0 - 99 mg/dL Final  . Chol/HDL Ratio 03/23/2014 6.2* 0.0 - 4.4 ratio units Final   Comment:                                   T. Chol/HDL Ratio                                             Men  Women                               1/2 Avg.Risk  3.4    3.3                                   Avg.Risk  5.0    4.4                                2X Avg.Risk  9.6  7.1                                3X Avg.Risk 23.4   11.0   . Hgb A1c MFr Bld 03/23/2014 9.2* 4.8 - 5.6 % Final   Comment:          Pre-diabetes: 5.7 - 6.4          Diabetes: >6.4          Glycemic control for adults with diabetes: <7.0   . Est. average glucose Bld gHb Est-m* 03/23/2014 217   Final  . TSH 03/23/2014 4.960* 0.450 - 4.500 uIU/mL Final    No results found.   Assessment/Plan   ICD-9-CM ICD-10-CM   1. Acute bronchitis, unspecified organism 466.0 J20.9 albuterol (PROVENTIL) (2.5 MG/3ML) 0.083% nebulizer solution 2.5 mg  2. DM type 2, uncontrolled, with renal complications 110.21 R17.35     E11.65   3. Allergic rhinitis due to pollen 477.0 J30.1      --Albuterol neb x 1   --Recommend plain OTC claritin, zyrtec or allegra daily for seasonal allergy  --Use saline nasal spray as needed to keep nose moist.  --Push fluids and rest  --Follow up as scheduled  Orley Lawry S. Perlie Gold  [facility] [address] [place], [place] [address] [phone] Cell (Monday-Friday 8 AM - 5 PM) [phone] After 5 PM and follow prompts

## 2014-05-11 NOTE — Patient Instructions (Signed)
Recommend plain OTC claritin, zyrtec or allegra daily for seasonal allergy  Use saline nasal spray as needed to keep nose moist.  Push fluids and rest  Follow up as scheduled

## 2014-06-01 ENCOUNTER — Other Ambulatory Visit: Payer: Self-pay | Admitting: *Deleted

## 2014-06-03 ENCOUNTER — Emergency Department (HOSPITAL_COMMUNITY)
Admission: EM | Admit: 2014-06-03 | Discharge: 2014-06-03 | Disposition: A | Discharge status: Home / Self Care | Payer: Medicare Other | Attending: Emergency Medicine | Admitting: Emergency Medicine

## 2014-06-03 ENCOUNTER — Emergency Department (HOSPITAL_COMMUNITY): Payer: Medicare Other

## 2014-06-03 ENCOUNTER — Encounter (HOSPITAL_COMMUNITY): Payer: Self-pay | Admitting: Emergency Medicine

## 2014-06-03 ENCOUNTER — Emergency Department (HOSPITAL_BASED_OUTPATIENT_CLINIC_OR_DEPARTMENT_OTHER)
Admit: 2014-06-03 | Discharge: 2014-06-03 | Disposition: A | Discharge status: Home / Self Care | Payer: Medicare Other | Attending: Emergency Medicine | Admitting: Emergency Medicine

## 2014-06-03 DIAGNOSIS — I10 Essential (primary) hypertension: Secondary | ICD-10-CM | POA: Diagnosis not present

## 2014-06-03 DIAGNOSIS — M2550 Pain in unspecified joint: Secondary | ICD-10-CM | POA: Diagnosis not present

## 2014-06-03 DIAGNOSIS — Z792 Long term (current) use of antibiotics: Secondary | ICD-10-CM | POA: Diagnosis not present

## 2014-06-03 DIAGNOSIS — M79661 Pain in right lower leg: Secondary | ICD-10-CM | POA: Diagnosis not present

## 2014-06-03 DIAGNOSIS — K219 Gastro-esophageal reflux disease without esophagitis: Secondary | ICD-10-CM | POA: Diagnosis not present

## 2014-06-03 DIAGNOSIS — Z8709 Personal history of other diseases of the respiratory system: Secondary | ICD-10-CM | POA: Insufficient documentation

## 2014-06-03 DIAGNOSIS — Z87440 Personal history of urinary (tract) infections: Secondary | ICD-10-CM | POA: Diagnosis not present

## 2014-06-03 DIAGNOSIS — M79609 Pain in unspecified limb: Secondary | ICD-10-CM | POA: Diagnosis not present

## 2014-06-03 DIAGNOSIS — Z79899 Other long term (current) drug therapy: Secondary | ICD-10-CM | POA: Insufficient documentation

## 2014-06-03 DIAGNOSIS — Z880 Allergy status to penicillin: Secondary | ICD-10-CM | POA: Diagnosis not present

## 2014-06-03 DIAGNOSIS — E785 Hyperlipidemia, unspecified: Secondary | ICD-10-CM | POA: Diagnosis not present

## 2014-06-03 DIAGNOSIS — E039 Hypothyroidism, unspecified: Secondary | ICD-10-CM | POA: Insufficient documentation

## 2014-06-03 DIAGNOSIS — Z8619 Personal history of other infectious and parasitic diseases: Secondary | ICD-10-CM | POA: Diagnosis not present

## 2014-06-03 DIAGNOSIS — M79662 Pain in left lower leg: Secondary | ICD-10-CM | POA: Diagnosis not present

## 2014-06-03 DIAGNOSIS — E119 Type 2 diabetes mellitus without complications: Secondary | ICD-10-CM | POA: Insufficient documentation

## 2014-06-03 DIAGNOSIS — Z7982 Long term (current) use of aspirin: Secondary | ICD-10-CM | POA: Diagnosis not present

## 2014-06-03 DIAGNOSIS — M79605 Pain in left leg: Secondary | ICD-10-CM

## 2014-06-03 LAB — BASIC METABOLIC PANEL
Anion gap: 8 (ref 5–15)
BUN: 7 mg/dL (ref 6–20)
CALCIUM: 9.6 mg/dL (ref 8.9–10.3)
CO2: 27 mmol/L (ref 22–32)
CREATININE: 0.77 mg/dL (ref 0.44–1.00)
Chloride: 104 mmol/L (ref 101–111)
GFR calc Af Amer: >60 mL/min (ref >60)
GFR calc non Af Amer: >60 mL/min (ref >60)
Glucose, Bld: 193 mg/dL — ABNORMAL HIGH (ref 65–99)
Potassium: 3.7 mmol/L (ref 3.5–5.1)
Sodium: 139 mmol/L (ref 135–145)

## 2014-06-03 LAB — CBC
HCT: 38.8 % (ref 36.0–46.0)
HEMOGLOBIN: 12.9 g/dL (ref 12.0–15.0)
MCH: 26.2 pg (ref 26.0–34.0)
MCHC: 33.2 g/dL (ref 30.0–36.0)
MCV: 78.7 fL (ref 78.0–100.0)
Platelets: 266 10*3/uL (ref 150–400)
RBC: 4.93 MIL/uL (ref 3.87–5.11)
RDW: 13.7 % (ref 11.5–15.5)
WBC: 8.4 10*3/uL (ref 4.0–10.5)

## 2014-06-03 MED ORDER — TRAMADOL HCL 50 MG PO TABS: 50.0000 mg | ORAL_TABLET | Freq: Four times a day (QID) | ORAL | Status: DC | PRN

## 2014-06-03 NOTE — Discharge Instructions (Signed)

## 2014-06-03 NOTE — ED Notes (Signed)
Pt c/o pain to right lower leg since January. Pt has history of sciatica but this pain is different. Pt also c/o wheezing, pt is currently being treated for bronchitis.

## 2014-06-03 NOTE — ED Provider Notes (Signed)
CSN: [id number]     Arrival date & time 06/03/14  1223 History   First MD Initiated Contact with Patient 06/03/14 1234     Chief Complaint  Patient presents with  . Leg Pain     (Consider location/radiation/quality/duration/timing/severity/associated sxs/prior Treatment) HPI Comments: Patient presents with right lower leg pain. She states it's been going on for about the last 2 months. It's worse when she is walking on it. It's on the lateral aspect of her right calf. She states it's a throbbing pain. She does have a history of sciatica on the right side but the pain in her leg does not seem to radiate from her sciatic pain. She denies any injury to the area other than several years ago she had a hematoma to the anterior portion of that leg that was drained and she has some scar tissue overlying the anterior lower leg. She denies any swelling of the leg. She denies any numbness or weakness to the leg. She denies any recent injuries. She takes Neurontin at home for pain but says it's not helping this pain. She's also recently been treated for bronchitis and UTI but says the symptoms have improved and she only wants to be evaluated for her leg pain.   Past Medical History  Diagnosis Date  . SOB (shortness of breath)   . Cough   . Dysphagia   . Hiatal hernia   . Diabetes mellitus without complication   . Hypertension   . Onychomycosis 03/05/2012  . Pain in lower limb 04/18/2013  . Vertigo   . Sciatic nerve pain   . GERD (gastroesophageal reflux disease)   . HLD (hyperlipidemia)   . Hypothyroidism   . Carpal tunnel syndrome    Past Surgical History  Procedure Laterality Date  . Vesicovaginal fistula closure w/ tah  1976  . Partial hysterectomy  1978  . Bladder surgery      Mesh implant  . Colonoscopy    . Larynx surgery      vocal cords   Family History  Problem Relation Age of Onset  . Asthma Sister   . Sleep apnea Sister   . Heart disease Sister   . Heart disease Father   .  Allergies Sister   . Allergies Sister   . Alzheimer's disease Mother   . Alzheimer's disease Father   . Breast cancer Maternal Aunt   . Pancreatic cancer Maternal Aunt   . Prostate cancer Maternal Uncle   . Prostate cancer Paternal Uncle   . Alcoholism Maternal Uncle   . Glaucoma Mother   . Kidney disease Maternal Aunt   . Heart disease Maternal Aunt    History  Substance Use Topics  . Smoking status: Never Smoker   . Smokeless tobacco: Never Used  . Alcohol Use: No   OB History    No data available     Review of Systems  Constitutional: Negative for fever.  Gastrointestinal: Negative for nausea and vomiting.  Musculoskeletal: Positive for myalgias and arthralgias. Negative for back pain, joint swelling and neck pain.  Skin: Negative for wound.  Neurological: Negative for weakness, numbness and headaches.      Allergies  Pineapple; Codeine; Invokana; Penicillins; and Vioxx  Home Medications   Prior to Admission medications   Medication Sig Start Date End Date Taking? Authorizing Provider  albuterol (PROVENTIL HFA;VENTOLIN HFA) 108 (90 BASE) MCG/ACT inhaler Inhale 2 puffs into the lungs every 4 (four) hours as needed for wheezing or shortness of breath.  05/11/14   Gildardo Cranker, DO  AMBULATORY NON FORMULARY MEDICATION Apply to your wrist daily 03/27/14   Blanchie Serve, MD  amLODipine-benazepril (LOTREL) 10-40 MG per capsule Take 1 capsule by mouth daily. For elevated blood pressure    Historical Provider, MD  aspirin EC 81 MG tablet Take 1 tablet (81 mg total) by mouth daily. 03/27/14   Blanchie Serve, MD  atorvastatin (LIPITOR) 20 MG tablet Take 1 tablet (20 mg total) by mouth daily. 03/27/14   Blanchie Serve, MD  azithromycin (ZITHROMAX) 250 MG tablet Take 1 tablet (250 mg total) by mouth daily. Take 2 tabs po on day 1 then 1 tab po daily on days 2-5 05/11/14   Gildardo Cranker, DO  chlorthalidone (HYGROTON) 25 MG tablet Take 25 mg by mouth daily.  10/10/12   Historical Provider,  MD  gabapentin (NEURONTIN) 100 MG capsule Take 1 capsule (100 mg total) by mouth 3 (three) times daily. 03/05/14   [name] Reed, DO  glyBURIDE-metformin (GLUCOVANCE) 5-500 MG per tablet Take 2 tablets by mouth 2 (two) times daily with a meal.      Historical Provider, MD  levothyroxine (SYNTHROID, LEVOTHROID) 175 MCG tablet Take 1 tablet (175 mcg total) by mouth every morning. 12/05/13   Blanchie Serve, MD  linagliptin (TRADJENTA) 5 MG TABS tablet Take 1 tablet (5 mg total) by mouth daily. 03/27/14   Blanchie Serve, MD  omeprazole (PRILOSEC) 20 MG capsule Take 1 capsule (20 mg total) by mouth daily. 03/27/14   Mahima Pandey, MD  RESTASIS 0.05 % ophthalmic emulsion Place 1 drop into both eyes 2 (two) times daily.  01/09/13   Historical Provider, MD  traMADol (ULTRAM) 50 MG tablet Take 1 tablet (50 mg total) by mouth every 6 (six) hours as needed. 06/03/14   Malvin Johns, MD  vitamin E (VITAMIN E) 1000 UNIT capsule Take 1,000 Units by mouth daily.    Historical Provider, MD   BP 158/70 mmHg  Pulse 74  Temp(Src) 97.9 F (36.6 C) (Oral)  Resp 16  Ht 5\' 2"  (1.575 m)  Wt 149 lb (67.586 kg)  BMI 27.25 kg/m2  SpO2 100% Physical Exam  Constitutional: She is oriented to person, place, and time. She appears well-developed and well-nourished.  HENT:  Head: Normocephalic and atraumatic.  Neck: Normal range of motion. Neck supple.  Cardiovascular: Normal rate.   Pulmonary/Chest: Effort normal.  Musculoskeletal: She exhibits tenderness. She exhibits no edema.  Patient has tenderness to the lateral aspect of her right lower leg. It's overlying the fibula area. She also has some mild tenderness to the posterior calf area. She has no pain to the knee or ankle. She has normal sensation in the foot. Normal motor function in the foot. Pedal pulses are intact. No wounds are noted. No warmth or erythema.  Neurological: She is alert and oriented to person, place, and time.  Skin: Skin is warm and dry.   Psychiatric: She has a normal mood and affect.    ED Course  Procedures (including critical care time) Labs Review Labs Reviewed  BASIC METABOLIC PANEL - Abnormal; Notable for the following:    Glucose, Bld 193 (*)    All other components within normal limits  CBC    Imaging Review Dg Tibia/fibula Right  06/03/2014   CLINICAL DATA:  Anterior mid to lower right leg pain since February. Pain is worsening. No known injury.  EXAM: RIGHT TIBIA AND FIBULA - 2 VIEW  COMPARISON:  12/19/2012  FINDINGS: No acute bony  abnormality. Specifically, no fracture, subluxation, or dislocation. Soft tissues are intact. Small soft tissue calcifications noted in the anterior soft tissues along the proximal tibia. Soft tissues otherwise unremarkable.  IMPRESSION: No bony abnormality.   Electronically Signed   By: Rolm Baptise M.D.   On: 06/03/2014 14:09     EKG Interpretation None      MDM   Final diagnoses:  Pain of left lower extremity    Patient has no evidence of bony injuries. There is no evidence of DVT on vascular ultrasound. This is likely musculoskeletal type pain. It does not seem to be radicular from her back. She was given a prescription for tramadol and advised to follow-up with her orthopedist.    Malvin Johns, MD 06/03/14 1446

## 2014-06-03 NOTE — Progress Notes (Signed)
*  Preliminary Results* Right lower extremity venous duplex completed. Right lower extremity is negative for deep vein thrombosis. There is no evidence of right Baker's cyst.  06/03/2014 1:35 PM  Maudry Mayhew, RVT, RDCS, RDMS

## 2014-06-05 ENCOUNTER — Other Ambulatory Visit: Payer: Self-pay | Admitting: Internal Medicine

## 2014-06-07 ENCOUNTER — Ambulatory Visit (AMBULATORY_SURGERY_CENTER): Payer: Medicare Other | Admitting: Internal Medicine

## 2014-06-07 ENCOUNTER — Encounter: Payer: Self-pay | Admitting: Internal Medicine

## 2014-06-07 VITALS — BP 136/80 | HR 63 | Temp 97.6°F | Resp 16 | Ht 62.0 in | Wt 150.0 lb

## 2014-06-07 DIAGNOSIS — R195 Other fecal abnormalities: Secondary | ICD-10-CM | POA: Diagnosis not present

## 2014-06-07 DIAGNOSIS — D124 Benign neoplasm of descending colon: Secondary | ICD-10-CM

## 2014-06-07 DIAGNOSIS — D125 Benign neoplasm of sigmoid colon: Secondary | ICD-10-CM | POA: Diagnosis not present

## 2014-06-07 DIAGNOSIS — E669 Obesity, unspecified: Secondary | ICD-10-CM | POA: Diagnosis not present

## 2014-06-07 DIAGNOSIS — K5730 Diverticulosis of large intestine without perforation or abscess without bleeding: Secondary | ICD-10-CM

## 2014-06-07 DIAGNOSIS — R42 Dizziness and giddiness: Secondary | ICD-10-CM | POA: Diagnosis not present

## 2014-06-07 DIAGNOSIS — E039 Hypothyroidism, unspecified: Secondary | ICD-10-CM | POA: Diagnosis not present

## 2014-06-07 DIAGNOSIS — E119 Type 2 diabetes mellitus without complications: Secondary | ICD-10-CM | POA: Diagnosis not present

## 2014-06-07 DIAGNOSIS — I10 Essential (primary) hypertension: Secondary | ICD-10-CM | POA: Diagnosis not present

## 2014-06-07 LAB — GLUCOSE, CAPILLARY
Glucose-Capillary: 201 mg/dL — ABNORMAL HIGH (ref 65–99)
Glucose-Capillary: 176 mg/dL — ABNORMAL HIGH (ref 65–99)

## 2014-06-07 MED ORDER — SODIUM CHLORIDE 0.9 % IV SOLN: 500.0000 mL | INTRAVENOUS | Status: DC

## 2014-06-07 NOTE — Op Note (Signed)
[place]  [employer]. [place], [id number]   COLONOSCOPY PROCEDURE REPORT  PATIENT: Allison, [name]  MR#: [id number] BIRTHDATE: [birth year] , 79  yrs. old GENDER: female ENDOSCOPIST: Gatha Mayer, MD, [facility] PROCEDURE DATE:  06/07/2014 PROCEDURE:   Colonoscopy, diagnostic and Colonoscopy with snare polypectomy First Screening Colonoscopy - Avg.  risk and is 50 yrs.  old or older - No.  Prior Negative Screening - Now for repeat screening. N/A  History of Adenoma - Now for follow-up colonoscopy & has been > or = to 3 yrs.  N/A  Polyps removed today? Yes ASA CLASS:   Class III INDICATIONS:Evaluation of unexplained GI bleeding and Patient is not applicable for Colorectal Neoplasm Risk Assessment for this procedure. MEDICATIONS: Propofol 160 mg IV and Monitored anesthesia care  DESCRIPTION OF PROCEDURE:   After the risks benefits and alternatives of the procedure were thoroughly explained, informed consent was obtained.  The digital rectal exam revealed no abnormalities of the rectum.   The LB [id number]  endoscope was introduced through the anus and advanced to the cecum, which was identified by both the appendix and ileocecal valve. No adverse events experienced.   The quality of the prep was excellent. (MiraLax was used)  The instrument was then slowly withdrawn as the colon was fully examined. Estimated blood loss is zero unless otherwise noted in this procedure report.   COLON FINDINGS: Three sessile polyps ranging from 4 to 48mm in size were found in the descending colon and sigmoid colon. Polypectomies were performed with a cold snare.  The resection was complete, the polyp tissue was completely retrieved and sent to histology.   There was mild diverticulosis noted in the sigmoid colon.   The examination was otherwise normal.  Retroflexed views revealed no abnormalities. The time to cecum = 2.8 Withdrawal time = 12.1   The scope was withdrawn and  the procedure completed. COMPLICATIONS: There were no immediate complications.  ENDOSCOPIC IMPRESSION: 1.   Three sessile polyps ranging from 4 to 22mm in size were found in the descending colon and sigmoid colon; polypectomies were performed with a cold snare 2.   Mild diverticulosis was noted in the sigmoid colon 3.   The examination was otherwise normal- excellent prep  RECOMMENDATIONS: Routine repeat colonoscopy screening not necessary.  See me/GI as needed.  eSigned:  Gatha Mayer, MD, [facility] 06/07/2014 3:26 PM   cc: The Patient and Gildardo Cranker, DO

## 2014-06-07 NOTE — Progress Notes (Signed)
Called to room to assist during endoscopic procedure.  Patient ID and intended procedure confirmed with present staff. Received instructions for my participation in the procedure from the performing physician.  

## 2014-06-07 NOTE — Patient Instructions (Addendum)
I found and removed 3 polyps - all look benign.  You also have a condition called diverticulosis - common and not usually a problem. Please read the handout provided.  You should not need any more testing.  Hope you win big at [facility].  I appreciate the opportunity to care for you. Gatha Mayer, MD, FACG   YOU HAD AN ENDOSCOPIC PROCEDURE TODAY AT [facility]:   Refer to the procedure report that was given to you for any specific questions about what was found during the examination.  If the procedure report does not answer your questions, please call your gastroenterologist to clarify.  If you requested that your care partner not be given the details of your procedure findings, then the procedure report has been included in a sealed envelope for you to review at your convenience later.  YOU SHOULD EXPECT: Some feelings of bloating in the abdomen. Passage of more gas than usual.  Walking can help get rid of the air that was put into your GI tract during the procedure and reduce the bloating. If you had a lower endoscopy (such as a colonoscopy or flexible sigmoidoscopy) you may notice spotting of blood in your stool or on the toilet paper. If you underwent a bowel prep for your procedure, you may not have a normal bowel movement for a few days.  Please Note:  You might notice some irritation and congestion in your nose or some drainage.  This is from the oxygen used during your procedure.  There is no need for concern and it should clear up in a day or so.  SYMPTOMS TO REPORT IMMEDIATELY:   Following lower endoscopy (colonoscopy or flexible sigmoidoscopy):  Excessive amounts of blood in the stool  Significant tenderness or worsening of abdominal pains  Swelling of the abdomen that is new, acute  Fever of 100F or higher   For urgent or emergent issues, a gastroenterologist can be reached at any hour by calling [phone].   DIET: Your first meal following  the procedure should be a small meal and then it is ok to progress to your normal diet. Heavy or fried foods are harder to digest and may make you feel nauseous or bloated.  Likewise, meals heavy in dairy and vegetables can increase bloating.  Drink plenty of fluids but you should avoid alcoholic beverages for 24 hours.  ACTIVITY:  You should plan to take it easy for the rest of today and you should NOT DRIVE or use heavy machinery until tomorrow (because of the sedation medicines used during the test).    FOLLOW UP: Our staff will call the number listed on your records the next business day following your procedure to check on you and address any questions or concerns that you may have regarding the information given to you following your procedure. If we do not reach you, we will leave a message.  However, if you are feeling well and you are not experiencing any problems, there is no need to return our call.  We will assume that you have returned to your regular daily activities without incident.  If any biopsies were taken you will be contacted by phone or by letter within the next 1-3 weeks.  Please call us at [phone] if you have not heard about the biopsies in 3 weeks.    SIGNATURES/CONFIDENTIALITY: You and/or your care partner have signed paperwork which will be entered into your electronic medical record.  These  signatures attest to the fact that that the information above on your After Visit Summary has been reviewed and is understood.  Full responsibility of the confidentiality of this discharge information lies with you and/or your care-partner. 

## 2014-06-07 NOTE — Progress Notes (Signed)
Report to PACU, RN, vss, BBS= Clear.  

## 2014-06-08 ENCOUNTER — Telehealth: Payer: Self-pay | Admitting: *Deleted

## 2014-06-08 NOTE — Telephone Encounter (Signed)
  Follow up Call-  Call back number 06/07/2014  Post procedure Call Back phone  # [phone]     No answer at # given.  Left message on voicemail.

## 2014-06-13 ENCOUNTER — Ambulatory Visit (INDEPENDENT_AMBULATORY_CARE_PROVIDER_SITE_OTHER): Payer: Medicare Other | Admitting: Podiatry

## 2014-06-13 ENCOUNTER — Other Ambulatory Visit: Payer: Self-pay | Admitting: *Deleted

## 2014-06-13 ENCOUNTER — Encounter: Payer: Self-pay | Admitting: Podiatry

## 2014-06-13 DIAGNOSIS — M79606 Pain in leg, unspecified: Secondary | ICD-10-CM | POA: Diagnosis not present

## 2014-06-13 DIAGNOSIS — B351 Tinea unguium: Secondary | ICD-10-CM

## 2014-06-13 MED ORDER — LEVOTHYROXINE SODIUM 175 MCG PO TABS: 175.0000 ug | ORAL_TABLET | Freq: Every morning | ORAL | Status: DC

## 2014-06-13 MED ORDER — AMLODIPINE BESY-BENAZEPRIL HCL 10-40 MG PO CAPS: ORAL_CAPSULE | ORAL | Status: DC

## 2014-06-13 MED ORDER — CHLORTHALIDONE 25 MG PO TABS: 25.0000 mg | ORAL_TABLET | Freq: Every day | ORAL | Status: DC

## 2014-06-13 MED ORDER — OMEPRAZOLE 20 MG PO CPDR: 20.0000 mg | DELAYED_RELEASE_CAPSULE | Freq: Every day | ORAL | Status: DC

## 2014-06-13 MED ORDER — GLYBURIDE-METFORMIN 5-500 MG PO TABS: 2.0000 | ORAL_TABLET | Freq: Two times a day (BID) | ORAL | Status: DC

## 2014-06-13 MED ORDER — ATORVASTATIN CALCIUM 20 MG PO TABS: 20.0000 mg | ORAL_TABLET | Freq: Every day | ORAL | Status: DC

## 2014-06-13 NOTE — Patient Instructions (Addendum)
Seen for hypertrophic nails. All nails debrided. Return in 3 months or as needed.    m

## 2014-06-13 NOTE — Progress Notes (Signed)
Subjective: 80 year old female presents stating her toes are painful from thick long toe nails.  Having itching sensation on left back of heel.   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 Thick dystrophic nails x 10.  Pedal pulses are not palpable.  No edema or erythema noted. No abnormal skin lesions noted.   Assessment: Mycotic nails bilateral. Diabetic Neuropathy. 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 PVD.  Plan: Debrided all nails.  May benefit from diabetic shoes. Return as needed.

## 2014-06-13 NOTE — Telephone Encounter (Signed)
Patient requested refills. Faxed to pharmacy.  

## 2014-06-19 ENCOUNTER — Encounter: Payer: Self-pay | Admitting: Internal Medicine

## 2014-06-19 DIAGNOSIS — Z860101 Personal history of adenomatous and serrated colon polyps: Secondary | ICD-10-CM | POA: Insufficient documentation

## 2014-06-19 DIAGNOSIS — Z86010 Personal history of colonic polyps: Secondary | ICD-10-CM

## 2014-06-19 HISTORY — DX: Personal history of adenomatous and serrated colon polyps: Z86.0101

## 2014-06-19 HISTORY — DX: Personal history of colonic polyps: Z86.010

## 2014-06-19 NOTE — Progress Notes (Signed)
Quick Note:  3 diminutive adenomas No recall due to age ______

## 2014-06-20 ENCOUNTER — Other Ambulatory Visit: Payer: Self-pay | Admitting: *Deleted

## 2014-06-25 ENCOUNTER — Other Ambulatory Visit: Payer: Medicare Other

## 2014-06-26 ENCOUNTER — Telehealth: Payer: Self-pay | Admitting: *Deleted

## 2014-06-26 ENCOUNTER — Other Ambulatory Visit: Payer: Medicare Other

## 2014-06-26 ENCOUNTER — Other Ambulatory Visit: Payer: Self-pay | Admitting: *Deleted

## 2014-06-26 DIAGNOSIS — E1165 Type 2 diabetes mellitus with hyperglycemia: Secondary | ICD-10-CM

## 2014-06-26 DIAGNOSIS — I10 Essential (primary) hypertension: Secondary | ICD-10-CM

## 2014-06-26 DIAGNOSIS — IMO0002 Reserved for concepts with insufficient information to code with codable children: Secondary | ICD-10-CM

## 2014-06-26 DIAGNOSIS — E1140 Type 2 diabetes mellitus with diabetic neuropathy, unspecified: Secondary | ICD-10-CM

## 2014-06-26 DIAGNOSIS — E118 Type 2 diabetes mellitus with unspecified complications: Secondary | ICD-10-CM

## 2014-06-26 MED ORDER — METFORMIN HCL 500 MG PO TABS: 1000.0000 mg | ORAL_TABLET | Freq: Two times a day (BID) | ORAL | Status: DC

## 2014-06-26 MED ORDER — GLYBURIDE 5 MG PO TABS: 10.0000 mg | ORAL_TABLET | Freq: Two times a day (BID) | ORAL | Status: DC

## 2014-06-26 NOTE — Telephone Encounter (Signed)
Called patient to inform her that Dr. Eulas Post ordered her medication separately, to please call the office if she has any problems or questions.

## 2014-06-27 ENCOUNTER — Ambulatory Visit (INDEPENDENT_AMBULATORY_CARE_PROVIDER_SITE_OTHER): Payer: Medicare Other | Admitting: Internal Medicine

## 2014-06-27 ENCOUNTER — Encounter: Payer: Self-pay | Admitting: Internal Medicine

## 2014-06-27 VITALS — BP 138/80 | HR 88 | Temp 97.9°F | Resp 20 | Ht 62.0 in | Wt 154.4 lb

## 2014-06-27 DIAGNOSIS — E1165 Type 2 diabetes mellitus with hyperglycemia: Secondary | ICD-10-CM | POA: Diagnosis not present

## 2014-06-27 DIAGNOSIS — E039 Hypothyroidism, unspecified: Secondary | ICD-10-CM

## 2014-06-27 DIAGNOSIS — I10 Essential (primary) hypertension: Secondary | ICD-10-CM | POA: Diagnosis not present

## 2014-06-27 DIAGNOSIS — E785 Hyperlipidemia, unspecified: Secondary | ICD-10-CM

## 2014-06-27 DIAGNOSIS — M5431 Sciatica, right side: Secondary | ICD-10-CM | POA: Diagnosis not present

## 2014-06-27 DIAGNOSIS — IMO0002 Reserved for concepts with insufficient information to code with codable children: Secondary | ICD-10-CM

## 2014-06-27 DIAGNOSIS — E1129 Type 2 diabetes mellitus with other diabetic kidney complication: Secondary | ICD-10-CM

## 2014-06-27 LAB — COMPREHENSIVE METABOLIC PANEL
ALBUMIN: 3.9 g/dL (ref 3.5–4.8)
ALT: 14 IU/L (ref 0–32)
AST: 16 IU/L (ref 0–40)
Albumin/Globulin Ratio: 1.5 (ref 1.1–2.5)
Alkaline Phosphatase: 67 IU/L (ref 39–117)
BILIRUBIN TOTAL: 0.4 mg/dL (ref 0.0–1.2)
BUN/Creatinine Ratio: 10 — ABNORMAL LOW (ref 11–26)
BUN: 8 mg/dL (ref 8–27)
CHLORIDE: 99 mmol/L (ref 97–108)
CO2: 22 mmol/L (ref 18–29)
Calcium: 9.6 mg/dL (ref 8.7–10.3)
Creatinine, Ser: 0.83 mg/dL (ref 0.57–1.00)
GFR calc non Af Amer: 68 mL/min/{1.73_m2} (ref >59)
GFR, EST AFRICAN AMERICAN: 79 mL/min/{1.73_m2} (ref >59)
GLOBULIN, TOTAL: 2.6 g/dL (ref 1.5–4.5)
Glucose: 171 mg/dL — ABNORMAL HIGH (ref 65–99)
Potassium: 4.1 mmol/L (ref 3.5–5.2)
Sodium: 140 mmol/L (ref 134–144)
TOTAL PROTEIN: 6.5 g/dL (ref 6.0–8.5)

## 2014-06-27 LAB — HEMOGLOBIN A1C
ESTIMATED AVERAGE GLUCOSE: 194 mg/dL
Hgb A1c MFr Bld: 8.4 % — ABNORMAL HIGH (ref 4.8–5.6)

## 2014-06-27 NOTE — Patient Instructions (Addendum)
Watch complex carbs to reduce blood sugars. Will recheck in 3 months  Continue current medications as ordered  follow up in 3 mos for routine visit

## 2014-06-27 NOTE — Progress Notes (Signed)
Patient ID: Helmut Muster, female   DOB: [birth year], 80 y.o.   MRN: [id number]    Location:    [facility]   Place of Service:   OFFICE  Chief Complaint  Patient presents with  . Medical Management of Chronic Issues    HPI:  80 yo female seen today for f/u. she had her colonoscopy last week and one polyp removed that was benign. She also had diverticular disease. No need for future colonoscopies. She has not picked up new rx for glyburide and metformin yet. She is completing glucovance bottle. Dietary choices poor recently due to attending various graduation parties/dinners. She has increased pepsi intake. Noticed she gained a few pounds. Last eye exam at [facility] in Jan 2016. No falls  BP elevated due to poor dietary choices. She does not take chlorthalidone as she thought the med was too old. She is taking lotrel as Rx  Omeprazole controls GERD sx's  She continues to have right sciatic pain and takes tramadol prn  She is taking lipitor for cholesterol. Also on an ASA daily  Thyroid stable on levothyroxine   Past Medical History  Diagnosis Date  . SOB (shortness of breath)   . Cough   . Dysphagia   . Hiatal hernia   . Diabetes mellitus without complication   . Hypertension   . Onychomycosis 03/05/2012  . Pain in lower limb 04/18/2013  . Vertigo   . Sciatic nerve pain   . GERD (gastroesophageal reflux disease)   . HLD (hyperlipidemia)   . Hypothyroidism   . Carpal tunnel syndrome   . Hx of adenomatous colonic polyps 06/19/2014    Past Surgical History  Procedure Laterality Date  . Vesicovaginal fistula closure w/ tah  1976  . Partial hysterectomy  1978  . Bladder surgery      Mesh implant  . Colonoscopy    . Larynx surgery      vocal cords    Patient Care Team: Gildardo Cranker, DO as PCP - General (Internal Medicine)  History   Social History  . Marital Status: Single    Spouse Name: N/A  . Number of Children: 2  . Years of Education: N/A   Occupational History    . Retired    Social History Main Topics  . Smoking status: Never Smoker   . Smokeless tobacco: Never Used  . Alcohol Use: No  . Drug Use: No  . Sexual Activity: No   Other Topics Concern  . Not on file   Social History Narrative   Single   2 sons   Retired   2 cups caffeine daily   05/01/2014        reports that she has never smoked. She has never used smokeless tobacco. She reports that she does not drink alcohol or use illicit drugs.  Allergies  Allergen Reactions  . Pineapple Anaphylaxis  . Codeine Other (See Comments)    unknown  . Invokana [Canagliflozin] Other (See Comments)    Constipation, nightmares  . Penicillins Other (See Comments)    Unknown   . Vioxx [Rofecoxib] Other (See Comments)    unknown    Medications: Patient's Medications  New Prescriptions   No medications on file  Previous Medications   ALBUTEROL (PROVENTIL HFA;VENTOLIN HFA) 108 (90 BASE) MCG/ACT INHALER    Inhale 2 puffs into the lungs every 4 (four) hours as needed for wheezing or shortness of breath.   AMBULATORY NON FORMULARY MEDICATION    Apply to your  wrist daily   AMLODIPINE-BENAZEPRIL (LOTREL) 10-40 MG PER CAPSULE    Take one capsule by mouth once daily for blood pressure   ASPIRIN EC 81 MG TABLET    Take 1 tablet (81 mg total) by mouth daily.   ATORVASTATIN (LIPITOR) 20 MG TABLET    Take 1 tablet (20 mg total) by mouth daily.   CHLORTHALIDONE (HYGROTON) 25 MG TABLET    Take 1 tablet (25 mg total) by mouth daily.   GABAPENTIN (NEURONTIN) 100 MG CAPSULE    Take 1 capsule (100 mg total) by mouth 3 (three) times daily.   GLYBURIDE (DIABETA) 5 MG TABLET    Take 2 tablets (10 mg total) by mouth 2 (two) times daily with a meal.   LEVOTHYROXINE (SYNTHROID, LEVOTHROID) 175 MCG TABLET    Take 1 tablet (175 mcg total) by mouth every morning.   METFORMIN (GLUCOPHAGE) 500 MG TABLET    Take 2 tablets (1,000 mg total) by mouth 2 (two) times daily with a meal.   OMEPRAZOLE (PRILOSEC) 20 MG  CAPSULE    Take 1 capsule (20 mg total) by mouth daily.   RESTASIS 0.05 % OPHTHALMIC EMULSION    Place 1 drop into both eyes 2 (two) times daily.    TRAMADOL (ULTRAM) 50 MG TABLET    Take 1 tablet (50 mg total) by mouth every 6 (six) hours as needed.   VITAMIN E (VITAMIN E) 1000 UNIT CAPSULE    Take 1,000 Units by mouth daily.  Modified Medications   No medications on file  Discontinued Medications   GLYBURIDE-METFORMIN (GLUCOVANCE) 5-500 MG PER TABLET    Take 2 tablets by mouth 2 (two) times daily with a meal.    Review of Systems  Constitutional: Negative for fever, chills, diaphoresis, activity change, appetite change and fatigue.  HENT: Negative for ear pain and sore throat.   Eyes: Negative for visual disturbance.  Respiratory: Negative for cough, chest tightness and shortness of breath.   Cardiovascular: Negative for chest pain, palpitations and leg swelling.  Gastrointestinal: Negative for nausea, vomiting, abdominal pain, diarrhea, constipation and blood in stool.  Genitourinary: Negative for dysuria.  Musculoskeletal: Positive for back pain, arthralgias and gait problem.       Right sciatica pain  Neurological: Positive for numbness (right wrist/hand). Negative for dizziness, tremors and headaches.  Psychiatric/Behavioral: Negative for sleep disturbance. The patient is not nervous/anxious.     Filed Vitals:   06/27/14 1052  BP: 138/80  Pulse: 88  Temp: 97.9 F (36.6 C)  TempSrc: Oral  Resp: 20  Height: '5\' 2"'  (1.575 m)  Weight: 154 lb 6.4 oz (70.035 kg)  SpO2: 96%   Body mass index is 28.23 kg/(m^2).  Physical Exam  Constitutional: She is oriented to person, place, and time. She appears well-developed and well-nourished.  HENT:  Mouth/Throat: Oropharynx is clear and moist. No oropharyngeal exudate.  Eyes: Pupils are equal, round, and reactive to light. No scleral icterus.  Neck: Neck supple. Carotid bruit is not present. No tracheal deviation present. No thyromegaly  present.  Cardiovascular: Normal rate, regular rhythm, normal heart sounds and intact distal pulses.  Exam reveals no gallop and no friction rub.   No murmur heard. No LE edema b/l. no calf TTP.   Pulmonary/Chest: Effort normal and breath sounds normal. No stridor. No respiratory distress. She has no wheezes. She has no rales.  Abdominal: Soft. Bowel sounds are normal. She exhibits no distension and no mass. There is no tenderness. There is no  rebound and no guarding.  Musculoskeletal: She exhibits tenderness.  Lymphadenopathy:    She has no cervical adenopathy.  Neurological: She is alert and oriented to person, place, and time. She has normal reflexes.  Skin: Skin is warm and dry. No rash noted.  Psychiatric: She has a normal mood and affect. Her behavior is normal. Judgment and thought content normal.   Diabetic Foot Exam - Simple   Simple Foot Form  Diabetic Foot exam was performed with the following findings:  Yes 06/27/2014 11:55 AM  Visual Inspection  See comments:  Yes  Sensation Testing  Intact to touch and monofilament testing bilaterally:  Yes  Pulse Check  Posterior Tibialis and Dorsalis pulse intact bilaterally:  Yes  Comments  Bunion b/l. No calluses/ulcerations       Labs reviewed: Appointment on 06/26/2014  Component Date Value Ref Range Status  . Glucose 06/26/2014 171* 65 - 99 mg/dL Final  . BUN 06/26/2014 8  8 - 27 mg/dL Final  . Creatinine, Ser 06/26/2014 0.83  0.57 - 1.00 mg/dL Final  . GFR calc non Af Amer 06/26/2014 68  >59 mL/min/1.73 Final  . GFR calc Af Amer 06/26/2014 79  >59 mL/min/1.73 Final  . BUN/Creatinine Ratio 06/26/2014 10* 11 - 26 Final  . Sodium 06/26/2014 140  134 - 144 mmol/L Final  . Potassium 06/26/2014 4.1  3.5 - 5.2 mmol/L Final  . Chloride 06/26/2014 99  97 - 108 mmol/L Final  . CO2 06/26/2014 22  18 - 29 mmol/L Final  . Calcium 06/26/2014 9.6  8.7 - 10.3 mg/dL Final  . Total Protein 06/26/2014 6.5  6.0 - 8.5 g/dL Final  . Albumin  06/26/2014 3.9  3.5 - 4.8 g/dL Final  . Globulin, Total 06/26/2014 2.6  1.5 - 4.5 g/dL Final  . Albumin/Globulin Ratio 06/26/2014 1.5  1.1 - 2.5 Final  . Bilirubin Total 06/26/2014 0.4  0.0 - 1.2 mg/dL Final  . Alkaline Phosphatase 06/26/2014 67  39 - 117 IU/L Final  . AST 06/26/2014 16  0 - 40 IU/L Final  . ALT 06/26/2014 14  0 - 32 IU/L Final  . Hgb A1c MFr Bld 06/26/2014 8.4* 4.8 - 5.6 % Final   Comment:          Pre-diabetes: 5.7 - 6.4          Diabetes: >6.4          Glycemic control for adults with diabetes: <7.0   . Est. average glucose Bld gHb Est-m* 06/26/2014 194   Final  Procedure visit on 06/07/2014  Component Date Value Ref Range Status  . Glucose-Capillary 06/07/2014 201* 65 - 99 mg/dL Final  . Glucose-Capillary 06/07/2014 176* 65 - 99 mg/dL Final  Admission on 06/03/2014, Discharged on 06/03/2014  Component Date Value Ref Range Status  . WBC 06/03/2014 8.4  4.0 - 10.5 K/uL Final  . RBC 06/03/2014 4.93  3.87 - 5.11 MIL/uL Final  . Hemoglobin 06/03/2014 12.9  12.0 - 15.0 g/dL Final  . HCT 06/03/2014 38.8  36.0 - 46.0 % Final  . MCV 06/03/2014 78.7  78.0 - 100.0 fL Final  . MCH 06/03/2014 26.2  26.0 - 34.0 pg Final  . MCHC 06/03/2014 33.2  30.0 - 36.0 g/dL Final  . RDW 06/03/2014 13.7  11.5 - 15.5 % Final  . Platelets 06/03/2014 266  150 - 400 K/uL Final  . Sodium 06/03/2014 139  135 - 145 mmol/L Final  . Potassium 06/03/2014 3.7  3.5 - 5.1  mmol/L Final  . Chloride 06/03/2014 104  101 - 111 mmol/L Final  . CO2 06/03/2014 27  22 - 32 mmol/L Final  . Glucose, Bld 06/03/2014 193* 65 - 99 mg/dL Final  . BUN 06/03/2014 7  6 - 20 mg/dL Final  . Creatinine, Ser 06/03/2014 0.77  0.44 - 1.00 mg/dL Final  . Calcium 06/03/2014 9.6  8.9 - 10.3 mg/dL Final  . GFR calc non Af Amer 06/03/2014 >60  >60 mL/min Final  . GFR calc Af Amer 06/03/2014 >60  >60 mL/min Final   Comment: (NOTE) The eGFR has been calculated using the CKD EPI equation. This calculation has not been validated  in all clinical situations. eGFR's persistently <60 mL/min signify possible Chronic Kidney Disease.   . Anion gap 06/03/2014 8  5 - 15 Final  Admission on 03/29/2014, Discharged on 03/29/2014  Component Date Value Ref Range Status  . Color, Urine 03/29/2014 YELLOW  YELLOW Final  . APPearance 03/29/2014 CLOUDY* CLEAR Final  . Specific Gravity, Urine 03/29/2014 1.014  1.005 - 1.030 Final  . pH 03/29/2014 5.0  5.0 - 8.0 Final  . Glucose, UA 03/29/2014 NEGATIVE  NEGATIVE mg/dL Final  . Hgb urine dipstick 03/29/2014 NEGATIVE  NEGATIVE Final  . Bilirubin Urine 03/29/2014 NEGATIVE  NEGATIVE Final  . Ketones, ur 03/29/2014 NEGATIVE  NEGATIVE mg/dL Final  . Protein, ur 03/29/2014 NEGATIVE  NEGATIVE mg/dL Final  . Urobilinogen, UA 03/29/2014 0.2  0.0 - 1.0 mg/dL Final  . Nitrite 03/29/2014 NEGATIVE  NEGATIVE Final  . Leukocytes, UA 03/29/2014 MODERATE* NEGATIVE Final  . Squamous Epithelial / LPF 03/29/2014 RARE  RARE Final  . WBC, UA 03/29/2014 11-20  <3 WBC/hpf Final  . Bacteria, UA 03/29/2014 RARE  RARE Final    Dg Tibia/fibula Right  06/03/2014   CLINICAL DATA:  Anterior mid to lower right leg pain since February. Pain is worsening. No known injury.  EXAM: RIGHT TIBIA AND FIBULA - 2 VIEW  COMPARISON:  12/19/2012  FINDINGS: No acute bony abnormality. Specifically, no fracture, subluxation, or dislocation. Soft tissues are intact. Small soft tissue calcifications noted in the anterior soft tissues along the proximal tibia. Soft tissues otherwise unremarkable.  IMPRESSION: No bony abnormality.   Electronically Signed   By: Rolm Baptise M.D.   On: 06/03/2014 14:09     Assessment/Plan   ICD-9-CM ICD-10-CM   1. DM type 2, uncontrolled, with renal complications - improving BS 250.42 V74.82 Basic Metabolic Panel    L07.86 Hemoglobin A1c     ALT  2. Essential hypertension - stable 401.9 I10   3. Hypothyroidism, unspecified hypothyroidism type - stable 244.9 E03.9 TSH  4. Sciatica, right - stable  724.3 M54.31   5. Hyperlipidemia LDL goal <100 - stable 272.4 E78.5 Lipid Panel   --resume chlorthalidone for better BP control  --Watch complex carbs to reduce blood sugars. Will recheck in 3 months  --Continue current medications as ordered  --follow up in 3 mos for routine visit  Tyjanae Bartek S. Perlie Gold  [facility] [address] [place], [place] [address] [phone] Cell (Monday-Friday 8 AM - 5 PM) [phone] After 5 PM and follow prompts

## 2014-07-27 DIAGNOSIS — N390 Urinary tract infection, site not specified: Secondary | ICD-10-CM | POA: Diagnosis not present

## 2014-07-27 DIAGNOSIS — R339 Retention of urine, unspecified: Secondary | ICD-10-CM | POA: Diagnosis not present

## 2014-08-09 ENCOUNTER — Encounter (HOSPITAL_COMMUNITY): Payer: Self-pay | Admitting: Vascular Surgery

## 2014-08-09 ENCOUNTER — Emergency Department (HOSPITAL_COMMUNITY)
Admission: EM | Admit: 2014-08-09 | Discharge: 2014-08-09 | Disposition: A | Discharge status: Home / Self Care | Payer: Medicare Other | Attending: Emergency Medicine | Admitting: Emergency Medicine

## 2014-08-09 ENCOUNTER — Emergency Department (HOSPITAL_COMMUNITY): Payer: Medicare Other

## 2014-08-09 DIAGNOSIS — Z79899 Other long term (current) drug therapy: Secondary | ICD-10-CM | POA: Insufficient documentation

## 2014-08-09 DIAGNOSIS — E119 Type 2 diabetes mellitus without complications: Secondary | ICD-10-CM | POA: Diagnosis not present

## 2014-08-09 DIAGNOSIS — K219 Gastro-esophageal reflux disease without esophagitis: Secondary | ICD-10-CM | POA: Insufficient documentation

## 2014-08-09 DIAGNOSIS — I10 Essential (primary) hypertension: Secondary | ICD-10-CM | POA: Insufficient documentation

## 2014-08-09 DIAGNOSIS — M25441 Effusion, right hand: Secondary | ICD-10-CM

## 2014-08-09 DIAGNOSIS — M79644 Pain in right finger(s): Secondary | ICD-10-CM

## 2014-08-09 DIAGNOSIS — Z8659 Personal history of other mental and behavioral disorders: Secondary | ICD-10-CM | POA: Diagnosis not present

## 2014-08-09 DIAGNOSIS — Z8669 Personal history of other diseases of the nervous system and sense organs: Secondary | ICD-10-CM | POA: Insufficient documentation

## 2014-08-09 DIAGNOSIS — M2548 Effusion, other site: Secondary | ICD-10-CM | POA: Diagnosis not present

## 2014-08-09 DIAGNOSIS — M898X4 Other specified disorders of bone, hand: Secondary | ICD-10-CM | POA: Diagnosis not present

## 2014-08-09 DIAGNOSIS — E039 Hypothyroidism, unspecified: Secondary | ICD-10-CM | POA: Diagnosis not present

## 2014-08-09 DIAGNOSIS — Z880 Allergy status to penicillin: Secondary | ICD-10-CM | POA: Diagnosis not present

## 2014-08-09 DIAGNOSIS — Z7982 Long term (current) use of aspirin: Secondary | ICD-10-CM | POA: Insufficient documentation

## 2014-08-09 DIAGNOSIS — Z8619 Personal history of other infectious and parasitic diseases: Secondary | ICD-10-CM | POA: Diagnosis not present

## 2014-08-09 DIAGNOSIS — E785 Hyperlipidemia, unspecified: Secondary | ICD-10-CM | POA: Diagnosis not present

## 2014-08-09 DIAGNOSIS — M7989 Other specified soft tissue disorders: Secondary | ICD-10-CM | POA: Diagnosis not present

## 2014-08-09 DIAGNOSIS — Z8719 Personal history of other diseases of the digestive system: Secondary | ICD-10-CM | POA: Diagnosis not present

## 2014-08-09 DIAGNOSIS — Z86018 Personal history of other benign neoplasm: Secondary | ICD-10-CM | POA: Diagnosis not present

## 2014-08-09 DIAGNOSIS — M79641 Pain in right hand: Secondary | ICD-10-CM | POA: Diagnosis not present

## 2014-08-09 LAB — CBG MONITORING, ED: Glucose-Capillary: 185 mg/dL — ABNORMAL HIGH (ref 65–99)

## 2014-08-09 MED ORDER — ACETAMINOPHEN 500 MG PO TABS: 500.0000 mg | ORAL_TABLET | Freq: Four times a day (QID) | ORAL | Status: DC | PRN

## 2014-08-09 MED ORDER — ACETAMINOPHEN 500 MG PO TABS
1000.0000 mg | ORAL_TABLET | Freq: Once | ORAL | Status: AC
  Administered 2014-08-09: 1000 mg via ORAL
  Filled 2014-08-09: qty 2

## 2014-08-09 NOTE — ED Notes (Signed)
Pt reports to the ED for eval of right ring finger pain x 3-4 days. Swelling, erythema, and increased pain noted to finger. She has hx of DM and carpal tunnel. Denies any injury to the finger. Pt A&Ox4, resp e/u, and skin warm and dry.

## 2014-08-09 NOTE — Discharge Instructions (Signed)
1. Medications: Tylenol, usual home medications 2. Treatment: rest, drink plenty of fluids, ice finger 3. Follow Up: Please followup with Dr. Fredna Dow of Hand Surgery in 3 days for discussion of your diagnoses and further evaluation after today's visit; if you do not have a primary care doctor use the resource guide provided to find one; Please return to the ER for worsening swelling, redness or pain

## 2014-08-09 NOTE — ED Provider Notes (Signed)
CSN: [id number]     Arrival date & time 08/09/14  1453 History  This chart is scribed for non-physician practitioner, Abigail Butts, PA-C, working with Dorie Rank, MD by Chester Holstein, ED Scribe.  This patient was seen in room TR07C/TR07C and the patient's care was started 4:16 PM.        Chief Complaint  Patient presents with  . Hand Pain     The history is provided by the patient and medical records. No language interpreter was used.  HPI Comments: Allison [name] is a 80 y.o. female with PMHx of sciatica, HTN, DM, HLD, carpal tunnel, and hypothyroidism who presents to the Emergency Department complaining of right ring finger pain with onset >1 week ago, which she reports has partially resolved today.  She reports finger is swollen, red, and crooked at the DIP joint.  She reports months of the "crooked" but the pain and swelling began 1.5 weeks ago. She is able to bend finger. She states she drinks a lot of regular Pepsi, but has not drank any today. She denies h/o gout. She has not taken any medication for relief. She denies known injury, numbness and tingling. Pt denies fever, chills, nausea, vomiting. Pt's PCP is Dr. Eulas Post.   4:21 PM CBG= 185   Past Medical History  Diagnosis Date  . SOB (shortness of breath)   . Cough   . Dysphagia   . Hiatal hernia   . Diabetes mellitus without complication   . Hypertension   . Onychomycosis 03/05/2012  . Pain in lower limb 04/18/2013  . Vertigo   . Sciatic nerve pain   . GERD (gastroesophageal reflux disease)   . HLD (hyperlipidemia)   . Hypothyroidism   . Carpal tunnel syndrome   . Hx of adenomatous colonic polyps 06/19/2014   Past Surgical History  Procedure Laterality Date  . Vesicovaginal fistula closure w/ tah  1976  . Partial hysterectomy  1978  . Bladder surgery      Mesh implant  . Colonoscopy    . Larynx surgery      vocal cords   Family History  Problem Relation Age of Onset  . Asthma Sister   . Sleep apnea Sister    . Heart disease Sister   . Heart disease Father   . Allergies Sister   . Allergies Sister   . Alzheimer's disease Mother   . Alzheimer's disease Father   . Breast cancer Maternal Aunt   . Pancreatic cancer Maternal Aunt   . Prostate cancer Maternal Uncle   . Prostate cancer Paternal Uncle   . Alcoholism Maternal Uncle   . Glaucoma Mother   . Kidney disease Maternal Aunt   . Heart disease Maternal Aunt    History  Substance Use Topics  . Smoking status: Never Smoker   . Smokeless tobacco: Never Used  . Alcohol Use: No   OB History    No data available     Review of Systems  Constitutional: Negative for fever and chills.  Gastrointestinal: Negative for nausea and vomiting.  Musculoskeletal: Positive for joint swelling and arthralgias. Negative for back pain, neck pain and neck stiffness.  Skin: Negative for wound.  Neurological: Negative for weakness and numbness.  Hematological: Does not bruise/bleed easily.  Psychiatric/Behavioral: The patient is not nervous/anxious.   All other systems reviewed and are negative.     Allergies  Pineapple; Codeine; Invokana; Penicillins; and Vioxx  Home Medications   Prior to Admission medications   Medication  Sig Start Date End Date Taking? Authorizing Provider  albuterol (PROVENTIL HFA;VENTOLIN HFA) 108 (90 BASE) MCG/ACT inhaler Inhale 2 puffs into the lungs every 4 (four) hours as needed for wheezing or shortness of breath. 05/11/14  Yes Gildardo Cranker, DO  amLODipine-benazepril (LOTREL) 10-40 MG per capsule Take one capsule by mouth once daily for blood pressure 06/13/14  Yes Gildardo Cranker, DO  aspirin EC 81 MG tablet Take 1 tablet (81 mg total) by mouth daily. 03/27/14  Yes Mahima Pandey, MD  atorvastatin (LIPITOR) 20 MG tablet Take 1 tablet (20 mg total) by mouth daily. 06/13/14  Yes Gildardo Cranker, DO  chlorthalidone (HYGROTON) 25 MG tablet Take 1 tablet (25 mg total) by mouth daily. 06/13/14  Yes Gildardo Cranker, DO  gabapentin  (NEURONTIN) 100 MG capsule Take 1 capsule (100 mg total) by mouth 3 (three) times daily. 03/05/14  Yes [name] Reed, DO  glyBURIDE (DIABETA) 5 MG tablet Take 2 tablets (10 mg total) by mouth 2 (two) times daily with a meal. 06/26/14  Yes Gildardo Cranker, DO  levothyroxine (SYNTHROID, LEVOTHROID) 175 MCG tablet Take 1 tablet (175 mcg total) by mouth every morning. 06/13/14  Yes Gildardo Cranker, DO  metFORMIN (GLUCOPHAGE) 500 MG tablet Take 2 tablets (1,000 mg total) by mouth 2 (two) times daily with a meal. 06/26/14  Yes Gildardo Cranker, DO  omeprazole (PRILOSEC) 20 MG capsule Take 1 capsule (20 mg total) by mouth daily. 06/13/14  Yes Gildardo Cranker, DO  vitamin E (VITAMIN E) 1000 UNIT capsule Take 1,000 Units by mouth daily.   Yes Historical Provider, MD  acetaminophen (TYLENOL) 500 MG tablet Take 1-2 tablets (500-1,000 mg total) by mouth every 6 (six) hours as needed (pain in your finger). 08/09/14   Gerianne Simonet, PA-C  AMBULATORY NON FORMULARY MEDICATION Apply to your wrist daily 03/27/14   Blanchie Serve, MD  traMADol (ULTRAM) 50 MG tablet Take 1 tablet (50 mg total) by mouth every 6 (six) hours as needed. Patient not taking: Reported on 08/09/2014 06/03/14   Malvin Johns, MD   BP 182/82 mmHg  Pulse 72  Temp(Src) 97.6 F (36.4 C) (Oral)  Resp 16  SpO2 100% Physical Exam  Constitutional: She appears well-developed and well-nourished. No distress.  HENT:  Head: Normocephalic and atraumatic.  Eyes: Conjunctivae are normal.  Neck: Normal range of motion.  Cardiovascular: Normal rate, regular rhythm, normal heart sounds and intact distal pulses.   No murmur heard. Capillary refill < 3 sec  Pulmonary/Chest: Effort normal and breath sounds normal.  Musculoskeletal: She exhibits tenderness. She exhibits no edema.  ROM: slightly decreased of the DIP of the right ring finger with mild erythema and TTP of the joint lines Full ROM of PIP and MCP no paronychia, pad of the finger is soft, non-tender and  without erythema  Neurological: She is alert. Coordination normal.  Sensation intact to dull and sharp Strength 5/5 in the right ring finger Strong grip strength  Skin: Skin is warm and dry. She is not diaphoretic.  No tenting of the skin  Psychiatric: She has a normal mood and affect.  Nursing note and vitals reviewed.   ED Course  Procedures (including critical care time) DIAGNOSTIC STUDIES: Oxygen Saturation is 95% on room air, normal by my interpretation.    COORDINATION OF CARE: 4:21 PM Discussed treatment plan with patient at beside, the patient agrees with the plan and has no further questions at this time.   Labs Review Labs Reviewed  CBG MONITORING, ED - Abnormal;  Notable for the following:    Glucose-Capillary 185 (*)    All other components within normal limits    Imaging Review Dg Finger Ring Right  08/09/2014   CLINICAL DATA:  Right ring finger pain and purple color for the past week. Small bump on the ventral aspect of the finger just proximal to the DIP joint.  EXAM: RIGHT RING FINGER 2+V  COMPARISON:  Right hand dated 03/15/2007.  FINDINGS: Interval multiple calcifications along the ulnar aspect of the fourth DIP joint. No bone destruction seen.  IMPRESSION: Periarticular calcifications along the long ulnar aspect of the fourth DIP joint. 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   Electronically Signed   By: Claudie Revering M.D.   On: 08/09/2014 15:39     EKG Interpretation None      MDM   Final diagnoses:  Pain in finger of right hand  Type 2 diabetes mellitus without complication  Finger joint swelling, right    Helmut Muster presents with right ring finger DIP joint pain for > 1 week.  Patient X-Ray negative for obvious fracture or dislocation. Periarticular calcifications noted.  Mild erythema of the joint and TTP noted but no increased warmth.  No evidence of paronychia or felon.  No discrete cellulitis.  Pain managed in ED. Pt advised to follow up  with Hand surgery for further evaluation of symptoms. Conservative therapy recommended and discussed. Patient will be dc home & is agreeable with above plan.  BP 182/82 mmHg  Pulse 72  Temp(Src) 97.6 F (36.4 C) (Oral)  Resp 16  SpO2 100%  The patient was discussed with and seen by Dr. Dorie Rank who agrees with the treatment plan.    Jarrett Soho Asher Torpey, PA-C 08/09/14 1928  Dorie Rank, MD 08/11/14 [phone]

## 2014-08-30 ENCOUNTER — Other Ambulatory Visit: Payer: Self-pay | Admitting: *Deleted

## 2014-09-14 ENCOUNTER — Ambulatory Visit: Payer: Medicare Other | Admitting: Podiatry

## 2014-09-19 ENCOUNTER — Ambulatory Visit (INDEPENDENT_AMBULATORY_CARE_PROVIDER_SITE_OTHER): Payer: Medicare Other | Admitting: Podiatry

## 2014-09-19 ENCOUNTER — Encounter: Payer: Self-pay | Admitting: Podiatry

## 2014-09-19 VITALS — BP 153/74 | HR 88

## 2014-09-19 DIAGNOSIS — B351 Tinea unguium: Secondary | ICD-10-CM

## 2014-09-19 DIAGNOSIS — M79606 Pain in leg, unspecified: Secondary | ICD-10-CM | POA: Diagnosis not present

## 2014-09-19 NOTE — Patient Instructions (Signed)
Seen for hypertrophic nails. All nails debrided. Return in 3 months or as needed.  

## 2014-09-19 NOTE — Progress Notes (Signed)
Subjective: 80 year old female presents complaining of painful toe nails.  Having itching sensation on left back of heel with dry scaly skin.   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 Thick dystrophic nails x 10.  Pedal pulses are not palpable.  No edema or erythema noted. No abnormal skin lesions noted.  Dry peeling skin plantar heels bilateral.   Assessment: Mycotic nails bilateral. Diabetic Neuropathy. 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 PVD. Tinea pedis bilateral.   Plan: Debrided all nails.  Reviewed scrubbing dry skin with Salsun blue shampoo.  Return in 3 months or as needed.

## 2014-09-28 ENCOUNTER — Ambulatory Visit: Payer: Medicare Other | Admitting: Internal Medicine

## 2014-10-03 ENCOUNTER — Ambulatory Visit: Payer: Medicare Other | Admitting: Internal Medicine

## 2014-10-08 ENCOUNTER — Ambulatory Visit (INDEPENDENT_AMBULATORY_CARE_PROVIDER_SITE_OTHER): Payer: Medicare Other | Admitting: Internal Medicine

## 2014-10-08 ENCOUNTER — Encounter: Payer: Self-pay | Admitting: Internal Medicine

## 2014-10-08 VITALS — BP 118/70 | HR 89 | Temp 97.4°F | Resp 20 | Ht 62.0 in | Wt 150.8 lb

## 2014-10-08 DIAGNOSIS — L22 Diaper dermatitis: Secondary | ICD-10-CM

## 2014-10-08 DIAGNOSIS — K0889 Other specified disorders of teeth and supporting structures: Secondary | ICD-10-CM

## 2014-10-08 DIAGNOSIS — R1310 Dysphagia, unspecified: Secondary | ICD-10-CM

## 2014-10-08 DIAGNOSIS — Z972 Presence of dental prosthetic device (complete) (partial): Secondary | ICD-10-CM

## 2014-10-08 NOTE — Patient Instructions (Signed)
I recommend you use a Barrier cream on your vaginal area and buttocks.  An example is aloe vesta cream.  They should have this or a similar cream at the pharmacy.  The redness of your gums may have been yeast also, but what remains, appears to be irritation from your dentures.

## 2014-10-08 NOTE — Progress Notes (Signed)
Patient ID: Allison [name], female   DOB: [birth year], 80 y.o.   MRN: [id number]   Location:  [facility]   Chief Complaint  Patient presents with  . Acute Visit    yeast infection, itching, pain x 1 week, fungus in her mouth x 1 week    HPI: Patient is a 80 y.o. black female seen in the office today for an acute visit with a couple of concerns.   1) irritation, redness in her vaginal area for a couple of days.  Has treated some with neosporin.  Thinks it came from wearing a depend that was too large for her.  Didn't realize how damp it had gotten until she took it off 12 hrs later.  Has not had any odor or discharge.  Not sexually active.  Is diabetic.  2)  Irritation, redness of gums, lips for several days.  Wears dentures and notes this has improved on its own, but the area in the inner aspect of her lower lip is still sore and she could hardly wear them.  She has not had any white film that she's noticed.    Review of Systems:  Review of Systems  Constitutional: Positive for weight loss. Negative for fever, chills and malaise/fatigue.  HENT:       Sore mouth, gums  Gastrointestinal: Negative for abdominal pain.  Genitourinary: Positive for dysuria and urgency.       Urinary incontinence, wears depends at times  Skin: Positive for itching and rash.       Of vaginal area, buttocks  Neurological: Negative for weakness.    Past Medical History  Diagnosis Date  . SOB (shortness of breath)   . Cough   . Dysphagia   . Hiatal hernia   . Diabetes mellitus without complication   . Hypertension   . Onychomycosis 03/05/2012  . Pain in lower limb 04/18/2013  . Vertigo   . Sciatic nerve pain   . GERD (gastroesophageal reflux disease)   . HLD (hyperlipidemia)   . Hypothyroidism   . Carpal tunnel syndrome   . Hx of adenomatous colonic polyps 06/19/2014    Past Surgical History  Procedure Laterality Date  . Vesicovaginal fistula closure w/  [place]  1976  . Partial hysterectomy  1978  . Bladder surgery      Mesh implant  . Colonoscopy    . Larynx surgery      vocal cords    Allergies  Allergen Reactions  . Pineapple Anaphylaxis  . Codeine Other (See Comments)    unknown  . Invokana [Canagliflozin] Other (See Comments)    Constipation, nightmares  . Penicillins Other (See Comments)    Unknown   . Vioxx [Rofecoxib] Other (See Comments)    unknown   Medications: Patient's Medications  New Prescriptions   No medications on file  Previous Medications   ACETAMINOPHEN (TYLENOL) 500 MG TABLET    Take 1-2 tablets (500-1,000 mg total) by mouth every 6 (six) hours as needed (pain in your finger).   ALBUTEROL (PROVENTIL HFA;VENTOLIN HFA) 108 (90 BASE) MCG/ACT INHALER    Inhale 2 puffs into the lungs every 4 (four) hours as needed for wheezing or shortness of breath.   AMBULATORY NON FORMULARY MEDICATION    Apply to your wrist daily   AMLODIPINE-BENAZEPRIL (LOTREL) 10-40 MG PER CAPSULE    Take one capsule by mouth once daily for blood pressure   ASPIRIN EC 81 MG TABLET  Take 1 tablet (81 mg total) by mouth daily.   ATORVASTATIN (LIPITOR) 20 MG TABLET    Take 1 tablet (20 mg total) by mouth daily.   CHLORTHALIDONE (HYGROTON) 25 MG TABLET    Take 1 tablet (25 mg total) by mouth daily.   GABAPENTIN (NEURONTIN) 100 MG CAPSULE    Take 1 capsule (100 mg total) by mouth 3 (three) times daily.   GLYBURIDE (DIABETA) 5 MG TABLET    Take 2 tablets (10 mg total) by mouth 2 (two) times daily with a meal.   LEVOTHYROXINE (SYNTHROID, LEVOTHROID) 175 MCG TABLET    Take 1 tablet (175 mcg total) by mouth every morning.   METFORMIN (GLUCOPHAGE) 500 MG TABLET    Take 2 tablets (1,000 mg total) by mouth 2 (two) times daily with a meal.   OMEPRAZOLE (PRILOSEC) 20 MG CAPSULE    Take 1 capsule (20 mg total) by mouth daily.   TRAMADOL (ULTRAM) 50 MG TABLET    Take 1 tablet (50 mg total) by mouth every 6 (six) hours as needed.   VITAMIN E (VITAMIN E)  1000 UNIT CAPSULE    Take 1,000 Units by mouth daily.  Modified Medications   No medications on file  Discontinued Medications   No medications on file    Physical Exam: Filed Vitals:   10/08/14 0931  BP: 118/70  Pulse: 89  Temp: 97.4 F (36.3 C)  TempSrc: Oral  Resp: 20  Height: 5\' 2"  (1.575 m)  Weight: 150 lb 12.8 oz (68.402 kg)  SpO2: 98%   Physical Exam  Constitutional: She is oriented to person, place, and time. She appears well-developed and well-nourished. No distress.  HENT:  Lower inner lip with erythema where dentures rub  Abdominal: Bowel sounds are normal. She exhibits no distension. There is no tenderness.  Musculoskeletal: Normal range of motion. She exhibits no tenderness.  Neurological: She is alert and oriented to person, place, and time.  Skin: Skin is warm and dry. Rash noted. There is erythema.  Vaginal area, labia, inner gluteal fold all with shiny erythema, no discharge, is tender, no odor  Psychiatric: She has a normal mood and affect.    Labs reviewed: Basic Metabolic Panel:  Recent Labs  11/15/13 0812 03/23/14 0851 06/03/14 1244 06/26/14 0841  NA 140 141 139 140  K 3.6 3.6 3.7 4.1  CL 98 98 104 99  CO2 28 25 27 22   GLUCOSE 120* 187* 193* 171*  BUN 9 12 7 8   CREATININE 0.75 0.85 0.77 0.83  CALCIUM 9.9 9.7 9.6 9.6  TSH 6.540* 4.960*  --   --    Liver Function Tests:  Recent Labs  11/15/13 0812 03/23/14 0851 06/26/14 0841  AST 13 13 16   ALT 14 11 14   ALKPHOS 63 74 67  BILITOT 0.6 0.5 0.4  PROT 6.7 6.9 6.5   No results for input(s): LIPASE, AMYLASE in the last 8760 hours. No results for input(s): AMMONIA in the last 8760 hours. CBC:  Recent Labs  11/15/13 0812 06/03/14 1244  WBC 8.9 8.4  NEUTROABS 5.4  --   HGB 12.4 12.9  HCT 36.7 38.8  MCV 81 78.7  PLT  --  266   Lipid Panel:  Recent Labs  11/15/13 0812 03/23/14 0851  CHOL 282* 297*  HDL 49 48  LDLCALC 205* 215*  TRIG 141 168*  CHOLHDL 5.8* 6.2*   Lab  Results  Component Value Date   HGBA1C 8.4* 06/26/2014   Assessment/Plan 1.  Diaper rash -due to depends with urinary incontinence -advised to change them regularly, use the proper size and apply barrier cream currently to treat erythematous areas  -also may use when wearing depends  2. Ill-fitting dentures -seems to be cause of erythema that remain in her lower lip -mentions she's lost a lot of weight so may need new dentures  Labs/tests ordered:  No new Next appt:  Keep as scheduled with Dr. Eulas Post next month  Yoshiko Keleher L. Sharyah Bostwick, D.O. [facility] [address],  [address] Cell Phone (Mon-Fri 8am-5pm):  [phone] On Call:  [phone] & follow prompts after 5pm & weekends Office Phone:  [phone] Office Fax:  [phone]

## 2014-10-19 ENCOUNTER — Encounter: Payer: Self-pay | Admitting: Internal Medicine

## 2014-10-19 ENCOUNTER — Ambulatory Visit (INDEPENDENT_AMBULATORY_CARE_PROVIDER_SITE_OTHER): Payer: Medicare Other | Admitting: Internal Medicine

## 2014-10-19 VITALS — BP 110/76 | HR 84 | Temp 98.0°F | Resp 20 | Ht 62.0 in | Wt 151.8 lb

## 2014-10-19 DIAGNOSIS — E1121 Type 2 diabetes mellitus with diabetic nephropathy: Secondary | ICD-10-CM | POA: Diagnosis not present

## 2014-10-19 DIAGNOSIS — E039 Hypothyroidism, unspecified: Secondary | ICD-10-CM | POA: Diagnosis not present

## 2014-10-19 DIAGNOSIS — K219 Gastro-esophageal reflux disease without esophagitis: Secondary | ICD-10-CM | POA: Diagnosis not present

## 2014-10-19 DIAGNOSIS — E1129 Type 2 diabetes mellitus with other diabetic kidney complication: Secondary | ICD-10-CM | POA: Diagnosis not present

## 2014-10-19 DIAGNOSIS — E1140 Type 2 diabetes mellitus with diabetic neuropathy, unspecified: Secondary | ICD-10-CM

## 2014-10-19 DIAGNOSIS — I10 Essential (primary) hypertension: Secondary | ICD-10-CM

## 2014-10-19 DIAGNOSIS — E1165 Type 2 diabetes mellitus with hyperglycemia: Secondary | ICD-10-CM | POA: Diagnosis not present

## 2014-10-19 DIAGNOSIS — E785 Hyperlipidemia, unspecified: Secondary | ICD-10-CM | POA: Diagnosis not present

## 2014-10-19 DIAGNOSIS — IMO0002 Reserved for concepts with insufficient information to code with codable children: Secondary | ICD-10-CM

## 2014-10-19 MED ORDER — GABAPENTIN 100 MG PO CAPS: 100.0000 mg | ORAL_CAPSULE | Freq: Three times a day (TID) | ORAL | Status: DC

## 2014-10-19 NOTE — Patient Instructions (Signed)
Needs flu shot in November (pt preference)  Restart gabapentin 3 caps daily for neuropathy  Continue other medications as ordered  Follow up in 4 mos for routine visit. Fasting labs 2-3 days prior to appt

## 2014-10-19 NOTE — Progress Notes (Signed)
Patient ID: Allison [name], female   DOB: August 27, 1934, 80 y.o.   MRN: [id number]    Location:   [facility]   Place of Service:  OFFICE  Chief Complaint  Patient presents with  . Medical Management of Chronic Issues    3 month folow-up for DM, Hyperlipidemia, Hypercalcemia  . OTHER    discuss Avanced Directive    HPI:  80 yo female seen today for f/u. She c/o numbness in finger tips. Se has not taken any gabapentin in several mos. Her sciatica resolved and she stopped taking it  She had intermittent vaginal itching x several  But none since weather cooled down and she increased water intake and reduced pepsi use. No d/c. She has occasional urinary leakage and sees urology. She saw Dr Mariea Clonts for vaginal sx's and was instructed to change depends more often and apply barrier cream which has helped.  HTN - stable on lotrel.   Hyperlipidemia - stable on statin. No myalgias  Thyroid - stable on levothyroxine. No constipation, diarrhea, increased anxiety/depression, palpitations or wt fluctuations  DM - BS stable at home (100s). She takes glyburide and metformin. Numbness in fingers  GERD - reflux sx's stable on omeprazole   Past Medical History  Diagnosis Date  . SOB (shortness of breath)   . Cough   . Dysphagia   . Hiatal hernia   . Diabetes mellitus without complication ([place])   . Hypertension   . Onychomycosis 03/05/2012  . Pain in lower limb 04/18/2013  . Vertigo   . Sciatic nerve pain   . GERD (gastroesophageal reflux disease)   . HLD (hyperlipidemia)   . Hypothyroidism   . Carpal tunnel syndrome   . Hx of adenomatous colonic polyps 06/19/2014    Past Surgical History  Procedure Laterality Date  . Vesicovaginal fistula closure w/ [place]  1976  . Partial hysterectomy  1978  . Bladder surgery      Mesh implant  . Colonoscopy    . Larynx surgery      vocal cords    Patient Care Team: Gildardo Cranker, DO as PCP - General (Internal Medicine)  Social History   Social History    . Marital Status: Single    Spouse Name: N/A  . Number of Children: 2  . Years of Education: N/A   Occupational History  . Retired    Social History Main Topics  . Smoking status: Never Smoker   . Smokeless tobacco: Never Used  . Alcohol Use: No  . Drug Use: No  . Sexual Activity: No   Other Topics Concern  . Not on file   Social History Narrative   Single   2 sons   Retired   2 cups caffeine daily   05/01/2014        reports that she has never smoked. She has never used smokeless tobacco. She reports that she does not drink alcohol or use illicit drugs.  Allergies  Allergen Reactions  . Pineapple Anaphylaxis  . Codeine Other (See Comments)    unknown  . Invokana [Canagliflozin] Other (See Comments)    Constipation, nightmares  . Penicillins Other (See Comments)    Unknown   . Vioxx [Rofecoxib] Other (See Comments)    unknown    Medications: Patient's Medications  New Prescriptions   No medications on file  Previous Medications   ACETAMINOPHEN (TYLENOL) 500 MG TABLET    Take 1-2 tablets (500-1,000 mg total) by mouth every 6 (six) hours as needed (  pain in your finger).   ALBUTEROL (PROVENTIL HFA;VENTOLIN HFA) 108 (90 BASE) MCG/ACT INHALER    Inhale 2 puffs into the lungs every 4 (four) hours as needed for wheezing or shortness of breath.   AMBULATORY NON FORMULARY MEDICATION    Apply to your wrist daily   AMLODIPINE-BENAZEPRIL (LOTREL) 10-40 MG PER CAPSULE    Take one capsule by mouth once daily for blood pressure   ASPIRIN EC 81 MG TABLET    Take 1 tablet (81 mg total) by mouth daily.   ATORVASTATIN (LIPITOR) 20 MG TABLET    Take 1 tablet (20 mg total) by mouth daily.   CHLORTHALIDONE (HYGROTON) 25 MG TABLET    Take 1 tablet (25 mg total) by mouth daily.   GABAPENTIN (NEURONTIN) 100 MG CAPSULE    Take 1 capsule (100 mg total) by mouth 3 (three) times daily.   GLYBURIDE (DIABETA) 5 MG TABLET    Take 2 tablets (10 mg total) by mouth 2 (two) times daily with a  meal.   LEVOTHYROXINE (SYNTHROID, LEVOTHROID) 175 MCG TABLET    Take 1 tablet (175 mcg total) by mouth every morning.   METFORMIN (GLUCOPHAGE) 500 MG TABLET    Take 2 tablets (1,000 mg total) by mouth 2 (two) times daily with a meal.   OMEPRAZOLE (PRILOSEC) 20 MG CAPSULE    Take 1 capsule (20 mg total) by mouth daily.   VITAMIN E (VITAMIN E) 1000 UNIT CAPSULE    Take 1,000 Units by mouth daily.  Modified Medications   No medications on file  Discontinued Medications   TRAMADOL (ULTRAM) 50 MG TABLET    Take 1 tablet (50 mg total) by mouth every 6 (six) hours as needed.    Review of Systems  Constitutional: Negative for fever, chills, diaphoresis, activity change, appetite change and fatigue.  HENT: Negative for ear pain and sore throat.   Eyes: Negative for visual disturbance.  Respiratory: Negative for cough, chest tightness and shortness of breath.   Cardiovascular: Negative for chest pain, palpitations and leg swelling.  Gastrointestinal: Negative for nausea, vomiting, abdominal pain, diarrhea, constipation and blood in stool.  Genitourinary: Negative for dysuria.  Musculoskeletal: Positive for arthralgias.  Neurological: Positive for numbness. Negative for dizziness, tremors and headaches.  Psychiatric/Behavioral: Negative for sleep disturbance. The patient is not nervous/anxious.     Filed Vitals:   10/19/14 1023  BP: 110/76  Pulse: 84  Temp: 98 F (36.7 C)  TempSrc: Oral  Resp: 20  Height: 5\' 2"  (1.575 m)  Weight: 151 lb 12.8 oz (68.856 kg)  SpO2: 98%   Body mass index is 27.76 kg/(m^2).  Physical Exam  Constitutional: She is oriented to person, place, and time. She appears well-developed and well-nourished.  HENT:  Mouth/Throat: Oropharynx is clear and moist. No oropharyngeal exudate.  Eyes: Pupils are equal, round, and reactive to light. No scleral icterus.  Neck: Neck supple. Carotid bruit is not present. No tracheal deviation present. No thyromegaly present.    Cardiovascular: Normal rate, regular rhythm, normal heart sounds and intact distal pulses.  Exam reveals no gallop and no friction rub.   No murmur heard. No LE edema b/l. no calf TTP.   Pulmonary/Chest: Effort normal and breath sounds normal. No stridor. No respiratory distress. She has no wheezes. She has no rales.  Abdominal: Soft. Bowel sounds are normal. She exhibits no distension and no mass. There is no hepatomegaly. There is tenderness (epigastric TTP). There is no rebound and no guarding.  Musculoskeletal:  She exhibits edema and tenderness.  Neg Tinel's test b/l  Lymphadenopathy:    She has no cervical adenopathy.  Neurological: She is alert and oriented to person, place, and time. She has normal reflexes.  Skin: Skin is warm and dry. No rash noted.  No foot lesions  Psychiatric: She has a normal mood and affect. Her behavior is normal. Judgment and thought content normal.     Labs reviewed: Admission on 08/09/2014, Discharged on 08/09/2014  Component Date Value Ref Range Status  . Glucose-Capillary 08/09/2014 185* 65 - 99 mg/dL Final   Lab Results  Component Value Date   HGBA1C 8.4* 06/26/2014   CBC Latest Ref Rng 06/03/2014 11/15/2013 08/07/2013  WBC 4.0 - 10.5 K/uL 8.4 8.9 6.4  Hemoglobin 12.0 - 15.0 g/dL 12.9 12.4 12.1  Hematocrit 36.0 - 46.0 % 38.8 36.7 35.5(L)  Platelets 150 - 400 K/uL 266 - 218    CMP Latest Ref Rng 06/26/2014 06/03/2014 03/23/2014  Glucose 65 - 99 mg/dL 171(H) 193(H) 187(H)  BUN 8 - 27 mg/dL 8 7 12   Creatinine 0.57 - 1.00 mg/dL 0.83 0.77 0.85  Sodium 134 - 144 mmol/L 140 139 141  Potassium 3.5 - 5.2 mmol/L 4.1 3.7 3.6  Chloride 97 - 108 mmol/L 99 104 98  CO2 18 - 29 mmol/L 22 27 25   Calcium 8.7 - 10.3 mg/dL 9.6 9.6 9.7  Total Protein 6.0 - 8.5 g/dL 6.5 - 6.9  Total Bilirubin 0.0 - 1.2 mg/dL 0.4 - 0.5  Alkaline Phos 39 - 117 IU/L 67 - 74  AST 0 - 40 IU/L 16 - 13  ALT 0 - 32 IU/L 14 - 11   Lipid Panel     Component Value Date/Time   CHOL  297* 03/23/2014 0851   TRIG 168* 03/23/2014 0851   HDL 48 03/23/2014 0851   CHOLHDL 6.2* 03/23/2014 0851   LDLCALC 215* 03/23/2014 0851       No results found.   Assessment/Plan   ICD-9-CM ICD-10-CM   1. DM type 2, uncontrolled, with renal complications ([place]) - improved home BS 250.42 N82.95 Basic Metabolic Panel    A21.30 Hemoglobin A1c     ALT  2. Hyperlipidemia LDL goal <100 - not at goal 272.4 E78.5 Lipid Panel  3. Hypothyroidism, unspecified hypothyroidism type - stable 244.9 E03.9 TSH  4. Uncontrolled type 2 diabetes with neuropathy ([facility]) 250.62 E11.40 gabapentin (NEURONTIN) 100 MG capsule   357.2 E11.65   5. Essential hypertension - stable 401.9 I10   6. Gastroesophageal reflux disease, esophagitis presence not specified - stable 530.81 K21.9     Needs flu shot in November (pt preference)  Restart gabapentin 3 caps daily for neuropathy  Continue other medications as ordered  Follow up in 4 mos for routine visit. Fasting labs 2-3 days prior to appt  [place] S. Perlie Gold  [facility] and [facility] [address] [place], [place] [address] [phone] Cell (Monday-Friday 8 AM - 5 PM) [phone] After 5 PM and follow prompts

## 2014-10-20 LAB — LIPID PANEL
CHOL/HDL RATIO: 4.8 ratio — AB (ref 0.0–4.4)
Cholesterol, Total: 261 mg/dL — ABNORMAL HIGH (ref 100–199)
HDL: 54 mg/dL (ref >39)
LDL Calculated: 186 mg/dL — ABNORMAL HIGH (ref 0–99)
Triglycerides: 105 mg/dL (ref 0–149)
VLDL Cholesterol Cal: 21 mg/dL (ref 5–40)

## 2014-10-20 LAB — BASIC METABOLIC PANEL
BUN / CREAT RATIO: 11 (ref 11–26)
BUN: 11 mg/dL (ref 8–27)
CO2: 27 mmol/L (ref 18–29)
Calcium: 9.9 mg/dL (ref 8.7–10.3)
Chloride: 98 mmol/L (ref 97–108)
Creatinine, Ser: 0.98 mg/dL (ref 0.57–1.00)
GFR calc Af Amer: 64 mL/min/{1.73_m2} (ref >59)
GFR calc non Af Amer: 56 mL/min/{1.73_m2} — ABNORMAL LOW (ref >59)
Glucose: 114 mg/dL — ABNORMAL HIGH (ref 65–99)
Potassium: 4.1 mmol/L (ref 3.5–5.2)
SODIUM: 143 mmol/L (ref 134–144)

## 2014-10-20 LAB — HEMOGLOBIN A1C
Est. average glucose Bld gHb Est-mCnc: 263 mg/dL
HEMOGLOBIN A1C: 10.8 % — AB (ref 4.8–5.6)

## 2014-10-20 LAB — ALT: ALT: 14 IU/L (ref 0–32)

## 2014-10-20 LAB — TSH: TSH: 6.95 u[IU]/mL — AB (ref 0.450–4.500)

## 2014-10-24 ENCOUNTER — Encounter: Payer: Self-pay | Admitting: *Deleted

## 2014-10-24 ENCOUNTER — Other Ambulatory Visit: Payer: Self-pay | Admitting: *Deleted

## 2014-10-24 MED ORDER — ATORVASTATIN CALCIUM 40 MG PO TABS: 40.0000 mg | ORAL_TABLET | Freq: Every day | ORAL | Status: DC

## 2014-10-29 ENCOUNTER — Ambulatory Visit (INDEPENDENT_AMBULATORY_CARE_PROVIDER_SITE_OTHER): Payer: Medicare Other | Admitting: Pharmacotherapy

## 2014-10-29 ENCOUNTER — Encounter: Payer: Self-pay | Admitting: Pharmacotherapy

## 2014-10-29 VITALS — BP 118/62 | HR 92 | Temp 97.8°F | Resp 98 | Ht 62.0 in | Wt 152.2 lb

## 2014-10-29 DIAGNOSIS — I10 Essential (primary) hypertension: Secondary | ICD-10-CM

## 2014-10-29 DIAGNOSIS — IMO0002 Reserved for concepts with insufficient information to code with codable children: Secondary | ICD-10-CM

## 2014-10-29 DIAGNOSIS — E1165 Type 2 diabetes mellitus with hyperglycemia: Secondary | ICD-10-CM | POA: Diagnosis not present

## 2014-10-29 DIAGNOSIS — E1121 Type 2 diabetes mellitus with diabetic nephropathy: Secondary | ICD-10-CM

## 2014-10-29 MED ORDER — INSULIN GLARGINE 100 UNIT/ML SOLOSTAR PEN: 10.0000 [IU] | PEN_INJECTOR | Freq: Every day | SUBCUTANEOUS | Status: DC

## 2014-10-29 MED ORDER — INSULIN PEN NEEDLE 32G X 4 MM MISC: 1.0000 | Freq: Every day | Status: DC

## 2014-10-29 NOTE — Patient Instructions (Signed)
Stop Glyburide Continue Metformin Start Lantus 10 units once daily

## 2014-10-29 NOTE — Progress Notes (Signed)
  Subjective:    Allison [name] is a 80 y.o.African American female who presents for follow-up of Type 2 diabetes mellitus.   Her DM is poorly controlled with an A1C of 10.8%. Needs to start insulin therapy - but very reluctant. She is scared of injections - dates back to getting vaccinations as a child.  Her peak weight was 190lb She has had DM x 10 years or more.  Trying to eat healthy, but she will not give up [facility] for exercise. Wears corrective lenses. Sees a podiatrist regularly.  Has peripheral neuropathy. Nocturia at least twice per night. No peripheral edema.   Review of Systems A comprehensive review of systems was negative except for: Eyes: positive for contacts/glasses Genitourinary: positive for nocturia Integument/breast: positive for dry skin and hair Endocrine: positive for diabetic symptoms including increased fatigue, skin dryness, weight loss and peipheral neuropathy    Objective:    BP 118/62 mmHg  Pulse 92  Temp(Src) 97.8 F (36.6 C) (Oral)  Resp 98  Ht 5' 2"$  (1.575 m)  Wt 152 lb 3.2 oz (69.037 kg)  BMI 27.83 kg/m2  SpO2 98%  General:  alert, cooperative and no distress  Oropharynx: normal findings: lips normal without lesions and gums healthy   Eyes:  negative findings: lids and lashes normal and conjunctivae and sclerae normal   Ears:  external ears normal        Lung: clear to auscultation bilaterally  Heart:  regular rate and rhythm     Extremities: no edema, but dry skin  Skin: dry     Neuro: mental status, speech normal, alert and oriented x3 and gait and station normal   Lab Review GLUCOSE (mg/dL)  Date Value  10/19/2014 114*  06/26/2014 171*  03/23/2014 187*   GLUCOSE, BLD (mg/dL)  Date Value  06/03/2014 193*  08/07/2013 159*  08/06/2013 100*   CO2 (mmol/L)  Date Value  10/19/2014 27  06/26/2014 22  06/03/2014 27   BUN (mg/dL)  Date Value  10/19/2014 11  06/26/2014 8  06/03/2014 7  03/23/2014 12   08/07/2013 18  08/06/2013 35*   CREATININE, SER (mg/dL)  Date Value  10/19/2014 0.98  06/26/2014 0.83  06/03/2014 0.77       Assessment:    Diabetes Mellitus type II, under poor control.   BP at goal <140/90   Plan:    1.  Rx changes: stop glyburide - doubt it is working.  Start Lantus 10 units daily.  2.  Counseled on risk / benefit of insulin.  Taught her how to self inject.  First dose given in office. 3.  Counseled on progression of DM and need for exogenous insulin. 4.  Counseled on meal planning and nutrition goals.  Written information given to supplement education. 5.  Counseled on benefit of routine exercise.  Goal is 30-45 minutes 5 x week. 6.  Counseled on foot care. 7.  Needs to SMBG daily and bring BGM to each OV. 8.  Continue Metformin. 9. BP at goal <140/90 10. RTC in 1 month

## 2014-11-19 ENCOUNTER — Ambulatory Visit: Payer: Medicare Other | Admitting: Pharmacotherapy

## 2014-11-26 ENCOUNTER — Other Ambulatory Visit: Payer: Self-pay

## 2014-11-26 DIAGNOSIS — Z1231 Encounter for screening mammogram for malignant neoplasm of breast: Secondary | ICD-10-CM

## 2014-12-03 ENCOUNTER — Encounter: Payer: Self-pay | Admitting: Pharmacotherapy

## 2014-12-03 ENCOUNTER — Ambulatory Visit (INDEPENDENT_AMBULATORY_CARE_PROVIDER_SITE_OTHER): Payer: Medicare Other | Admitting: Pharmacotherapy

## 2014-12-03 VITALS — BP 110/72 | HR 91 | Temp 97.6°F | Resp 20 | Ht 62.0 in | Wt 145.0 lb

## 2014-12-03 DIAGNOSIS — IMO0002 Reserved for concepts with insufficient information to code with codable children: Secondary | ICD-10-CM

## 2014-12-03 DIAGNOSIS — I10 Essential (primary) hypertension: Secondary | ICD-10-CM

## 2014-12-03 DIAGNOSIS — E1121 Type 2 diabetes mellitus with diabetic nephropathy: Secondary | ICD-10-CM | POA: Diagnosis not present

## 2014-12-03 DIAGNOSIS — E1165 Type 2 diabetes mellitus with hyperglycemia: Secondary | ICD-10-CM

## 2014-12-03 MED ORDER — INSULIN GLARGINE 100 UNIT/ML SOLOSTAR PEN: 20.0000 [IU] | PEN_INJECTOR | Freq: Every day | SUBCUTANEOUS | Status: DC

## 2014-12-03 NOTE — Progress Notes (Signed)
  Subjective:    Allison [name] is a 80 y.o.African American female who presents for follow-up of Type 2 diabetes mellitus.   She was started on Lantus last month, but has allowed the sample to run out and did not get it filled at the drug store.  SMBG:  236-373m/dl NO hypoglycemia  Has what looks like bug bites on her arms.  She has polyphagia She is complaining of a vaginal yeast infection. Walks for exercise. Wears corrective lenses. No peripheral edema. Nocturia at least once per night. Denies problems with feet.  Has dry skin.  Review of Systems A comprehensive review of systems was negative except for: Eyes: positive for contacts/glasses Gastrointestinal: positive for diarrhea Genitourinary: positive for nocturia Integument/breast: positive for dryness and pruritus Endocrine: positive for diabetic symptoms including polyphagia    Objective:    BP 110/72 mmHg  Pulse 91  Temp(Src) 97.6 F (36.4 C) (Oral)  Resp 20  Ht 5' 2"$  (1.575 m)  Wt 145 lb (65.772 kg)  BMI 26.51 kg/m2  SpO2 98%  General:  alert, cooperative and no distress  Oropharynx: normal findings: lips normal without lesions and gums healthy   Eyes:  negative findings: lids and lashes normal and conjunctivae and sclerae normal   Ears:  external ears normal        Lung: clear to auscultation bilaterally  Heart:  regular rate and rhythm     Extremities: extremities normal, atraumatic, no cyanosis or edema  Skin: dry     Neuro: mental status, speech normal, alert and oriented x3 and gait and station normal   Lab Review GLUCOSE (mg/dL)  Date Value  10/19/2014 114*  06/26/2014 171*  03/23/2014 187*   GLUCOSE, BLD (mg/dL)  Date Value  06/03/2014 193*  08/07/2013 159*  08/06/2013 100*   CO2 (mmol/L)  Date Value  10/19/2014 27  06/26/2014 22  06/03/2014 27   BUN (mg/dL)  Date Value  10/19/2014 11  06/26/2014 8  06/03/2014 7  03/23/2014 12  08/07/2013 18  08/06/2013 35*    CREATININE, SER (mg/dL)  Date Value  10/19/2014 0.98  06/26/2014 0.83  06/03/2014 0.77       Assessment:    Diabetes Mellitus type II, under poor control.   BP at goal <140/90    Plan:    1.  Rx changes: Increase Lantus 20 units daily 2.  Counseled on nutrition goals.  Stop with the daily cranberry juice. 3.  Continue metformin. 4.  Try OTC Monistat for vaginal yeast.  If no improvement, call back. 5.  Exercise goal is 30-45 minutes 5 x week. 6.  Counseled on skin care. 7.  BP at goal <140/90

## 2014-12-03 NOTE — Patient Instructions (Signed)
Increase Lantus 20 units daily Try over the counter Monistat for yeast infection.  If no improvement, call back.

## 2014-12-04 ENCOUNTER — Ambulatory Visit: Payer: Medicare Other | Admitting: Nurse Practitioner

## 2014-12-12 ENCOUNTER — Ambulatory Visit (INDEPENDENT_AMBULATORY_CARE_PROVIDER_SITE_OTHER): Payer: Medicare Other | Admitting: Internal Medicine

## 2014-12-12 ENCOUNTER — Encounter: Payer: Self-pay | Admitting: Internal Medicine

## 2014-12-12 VITALS — BP 150/80 | HR 81 | Temp 97.6°F | Resp 20 | Ht 62.0 in | Wt 145.4 lb

## 2014-12-12 DIAGNOSIS — E1165 Type 2 diabetes mellitus with hyperglycemia: Secondary | ICD-10-CM

## 2014-12-12 DIAGNOSIS — E1140 Type 2 diabetes mellitus with diabetic neuropathy, unspecified: Secondary | ICD-10-CM | POA: Diagnosis not present

## 2014-12-12 DIAGNOSIS — Z794 Long term (current) use of insulin: Secondary | ICD-10-CM

## 2014-12-12 DIAGNOSIS — E1101 Type 2 diabetes mellitus with hyperosmolarity with coma: Secondary | ICD-10-CM

## 2014-12-12 DIAGNOSIS — I10 Essential (primary) hypertension: Secondary | ICD-10-CM | POA: Diagnosis not present

## 2014-12-12 DIAGNOSIS — B029 Zoster without complications: Secondary | ICD-10-CM | POA: Diagnosis not present

## 2014-12-12 DIAGNOSIS — IMO0002 Reserved for concepts with insufficient information to code with codable children: Secondary | ICD-10-CM

## 2014-12-12 MED ORDER — VALACYCLOVIR HCL 1 G PO TABS: 1000.0000 mg | ORAL_TABLET | Freq: Three times a day (TID) | ORAL | Status: DC

## 2014-12-12 MED ORDER — GABAPENTIN 100 MG PO CAPS: 200.0000 mg | ORAL_CAPSULE | Freq: Three times a day (TID) | ORAL | Status: DC

## 2014-12-12 NOTE — Patient Instructions (Addendum)
Increase gabapentin to 2 tabs 3 times daily  May use cool compresses as needed for itching  Start valtrex 1 gram 3 times daily for 7 days  Follow up as scheduled

## 2014-12-12 NOTE — Progress Notes (Signed)
Patient ID: Allison [name], female   DOB: 05/10/1934, 80 y.o.   MRN: [id number]    Location:    [facility]   Place of Service:  OFFICE   Chief Complaint  Patient presents with  . Acute Visit    Patient has a painful ichy rash on her right  arm and on her left arm and swelling on both        HPI:  80 yo female seen today for painful itchy rash x few weeks. Rash waxing and waning and appearing in b/l upper and lower extremity, ear lobes and neck. blisters ooze occasionally. She reports increased stressors at home and has insomnia.  DM - CBGs 200-400s. She is followed by Tivis Ringer. She is on insulin. Last A1c 10.8%. Neuropathy worse due to rash   Past Medical History  Diagnosis Date  . SOB (shortness of breath)   . Cough   . Dysphagia   . Hiatal hernia   . Diabetes mellitus without complication ([place])   . Hypertension   . Onychomycosis 03/05/2012  . Pain in lower limb 04/18/2013  . Vertigo   . Sciatic nerve pain   . GERD (gastroesophageal reflux disease)   . HLD (hyperlipidemia)   . Hypothyroidism   . Carpal tunnel syndrome   . Hx of adenomatous colonic polyps 06/19/2014    Past Surgical History  Procedure Laterality Date  . Vesicovaginal fistula closure w/ tah  1976  . Partial hysterectomy  1978  . Bladder surgery      Mesh implant  . Colonoscopy    . Larynx surgery      vocal cords    Patient Care Team: Gildardo Cranker, DO as PCP - General (Internal Medicine)  Social History   Social History  . Marital Status: Single    Spouse Name: N/A  . Number of Children: 2  . Years of Education: N/A   Occupational History  . Retired    Social History Main Topics  . Smoking status: Never Smoker   . Smokeless tobacco: Never Used  . Alcohol Use: No  . Drug Use: No  . Sexual Activity: No   Other Topics Concern  . Not on file   Social History Narrative   Single   2 sons   Retired   2 cups caffeine daily   05/01/2014        reports that she has never smoked. She  has never used smokeless tobacco. She reports that she does not drink alcohol or use illicit drugs.  Allergies  Allergen Reactions  . Pineapple Anaphylaxis  . Codeine Other (See Comments)    unknown  . Invokana [Canagliflozin] Other (See Comments)    Constipation, nightmares  . Penicillins Other (See Comments)    Unknown   . Tradjenta [Linagliptin] Other (See Comments)  . Vioxx [Rofecoxib] Other (See Comments)    unknown    Medications: Patient's Medications  New Prescriptions   No medications on file  Previous Medications   ALBUTEROL (PROVENTIL HFA;VENTOLIN HFA) 108 (90 BASE) MCG/ACT INHALER    Inhale 2 puffs into the lungs every 4 (four) hours as needed for wheezing or shortness of breath.   AMBULATORY NON FORMULARY MEDICATION    Apply to your wrist daily   AMLODIPINE-BENAZEPRIL (LOTREL) 10-40 MG PER CAPSULE    Take one capsule by mouth once daily for blood pressure   ASPIRIN EC 81 MG TABLET    Take 1 tablet (81 mg total) by mouth daily.  ATORVASTATIN (LIPITOR) 40 MG TABLET    Take 1 tablet (40 mg total) by mouth at bedtime.   CHLORTHALIDONE (HYGROTON) 25 MG TABLET    Take 1 tablet (25 mg total) by mouth daily.   GABAPENTIN (NEURONTIN) 100 MG CAPSULE    Take 1 capsule (100 mg total) by mouth 3 (three) times daily. For neuropathy   GLYBURIDE (DIABETA) 5 MG TABLET    Take 2 tablets (10 mg total) by mouth 2 (two) times daily with a meal.   INSULIN GLARGINE (LANTUS SOLOSTAR) 100 UNIT/ML SOLOSTAR PEN    Inject 20 Units into the skin daily at 10 pm.   INSULIN PEN NEEDLE 32G X 4 MM MISC    1 each by Does not apply route daily.   LEVOTHYROXINE (SYNTHROID, LEVOTHROID) 175 MCG TABLET    Take 1 tablet (175 mcg total) by mouth every morning.   METFORMIN (GLUCOPHAGE) 500 MG TABLET    Take 2 tablets (1,000 mg total) by mouth 2 (two) times daily with a meal.   OMEPRAZOLE (PRILOSEC) 20 MG CAPSULE    Take 1 capsule (20 mg total) by mouth daily.   VITAMIN E (VITAMIN E) 1000 UNIT CAPSULE    Take  1,000 Units by mouth daily.  Modified Medications   No medications on file  Discontinued Medications   No medications on file    Review of Systems  Skin: Positive for rash.  Neurological: Positive for numbness.  Psychiatric/Behavioral: Positive for sleep disturbance. The patient is nervous/anxious.     Filed Vitals:   12/12/14 1128  BP: 150/80  Pulse: 81  Temp: 97.6 F (36.4 C)  TempSrc: Oral  Resp: 20  Height: 5\' 2"  (1.575 m)  Weight: 145 lb 6.4 oz (65.953 kg)  SpO2: 98%   Body mass index is 26.59 kg/(m^2).  Physical Exam  Constitutional: She is oriented to person, place, and time. She appears well-developed and well-nourished. No distress.  Neurological: She is alert and oriented to person, place, and time.  Skin: Skin is warm, dry and intact. Rash (vesicular rash with red base, b/l upper extremity, posterior neck, scabbing  on lower extremity. no secondary signs of infection. ) noted.  Psychiatric: She has a normal mood and affect. Her behavior is normal.     Labs reviewed: Office Visit on 10/19/2014  Component Date Value Ref Range Status  . Glucose 10/19/2014 114* 65 - 99 mg/dL Final  . BUN 10/19/2014 11  8 - 27 mg/dL Final  . Creatinine, Ser 10/19/2014 0.98  0.57 - 1.00 mg/dL Final  . GFR calc non Af Amer 10/19/2014 56* >59 mL/min/1.73 Final  . GFR calc Af Amer 10/19/2014 64  >59 mL/min/1.73 Final  . BUN/Creatinine Ratio 10/19/2014 11  11 - 26 Final  . Sodium 10/19/2014 143  134 - 144 mmol/L Final   Comment: **Effective October 29, 2014 the reference interval**   for Sodium, Serum will be changing to:                                             136 - 144   . Potassium 10/19/2014 4.1  3.5 - 5.2 mmol/L Final   Comment: **Effective October 29, 2014 the reference interval**   for Potassium, Serum will be changing to:  0 -  7 days        3.7 - 5.2                          8 - 30 days        3.7 - 6.4                          1 -  6 months       3.8 - 6.0                   7 months -  1 year        3.8 - 5.3                              >1 year        3.5 - 5.2   . Chloride 10/19/2014 98  97 - 108 mmol/L Final   Comment: **Effective October 29, 2014 the reference interval**   for Chloride, Serum will be changing to:                                              97 - 106   . CO2 10/19/2014 27  18 - 29 mmol/L Final  . Calcium 10/19/2014 9.9  8.7 - 10.3 mg/dL Final  . Hgb A1c MFr Bld 10/19/2014 10.8* 4.8 - 5.6 % Final   Comment:          Pre-diabetes: 5.7 - 6.4          Diabetes: >6.4          Glycemic control for adults with diabetes: <7.0   . Est. average glucose Bld gHb Est-m* 10/19/2014 263   Final  . Cholesterol, Total 10/19/2014 261* 100 - 199 mg/dL Final  . Triglycerides 10/19/2014 105  0 - 149 mg/dL Final  . HDL 10/19/2014 54  >39 mg/dL Final   Comment: According to ATP-III Guidelines, HDL-C >59 mg/dL is considered a negative risk factor for CHD.   Marland Kitchen VLDL Cholesterol Cal 10/19/2014 21  5 - 40 mg/dL Final  . LDL Calculated 10/19/2014 186* 0 - 99 mg/dL Final  . Chol/HDL Ratio 10/19/2014 4.8* 0.0 - 4.4 ratio units Final   Comment:                                   T. Chol/HDL Ratio                                             Men  Women                               1/2 Avg.Risk  3.4    3.3                                   Avg.Risk  5.0    4.4  2X Avg.Risk  9.6    7.1                                3X Avg.Risk 23.4   11.0   . TSH 10/19/2014 6.950* 0.450 - 4.500 uIU/mL Final  . ALT 10/19/2014 14  0 - 32 IU/L Final    No results found.   Assessment/Plan   ICD-9-CM ICD-10-CM   1. Herpes zoster - recurrent; failing to change as expected 053.9 B02.9 valACYclovir (VALTREX) 1000 MG tablet  2. Uncontrolled type 2 diabetes mellitus with hyperosmolar coma, with long-term current use of insulin (HCC) 250.22 E11.01    V58.67 Z79.4   3. Essential hypertension - BP elevated due to #1 401.9  I10   4. Uncontrolled type 2 diabetes with neuropathy (HCC) 250.62 E11.40 gabapentin (NEURONTIN) 100 MG capsule   357.2 E11.65    Increase gabapentin to 2 tabs 3 times daily  May use cool compresses as needed for itching  Start valtrex 1 gram 3 times daily for 7 days as rash is generalized and she is having frequent eruptions  Follow up as scheduled  Angel Weedon S. Perlie Gold  [facility] [address] [place], [place] [address] [phone] Cell (Monday-Friday 8 AM - 5 PM) [phone] After 5 PM and follow prompts

## 2014-12-13 ENCOUNTER — Ambulatory Visit: Payer: Medicare Other

## 2014-12-17 ENCOUNTER — Other Ambulatory Visit: Payer: Self-pay | Admitting: Internal Medicine

## 2014-12-19 ENCOUNTER — Ambulatory Visit (INDEPENDENT_AMBULATORY_CARE_PROVIDER_SITE_OTHER): Payer: Medicare Other | Admitting: Podiatry

## 2014-12-19 ENCOUNTER — Encounter: Payer: Self-pay | Admitting: Podiatry

## 2014-12-19 VITALS — BP 166/76 | HR 83

## 2014-12-19 DIAGNOSIS — M79606 Pain in leg, unspecified: Secondary | ICD-10-CM | POA: Diagnosis not present

## 2014-12-19 DIAGNOSIS — B351 Tinea unguium: Secondary | ICD-10-CM

## 2014-12-19 DIAGNOSIS — H04123 Dry eye syndrome of bilateral lacrimal glands: Secondary | ICD-10-CM | POA: Diagnosis not present

## 2014-12-19 NOTE — Progress Notes (Signed)
Subjective: 80 year old female presents complaining of painful toe nails.  Stated that she is taking insulin now.  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 Thick dystrophic nails x 10.  Pedal pulses are not palpable.  No edema or erythema noted. No abnormal skin lesions noted.  Dry peeling skin plantar heels bilateral.   Assessment: Mycotic nails bilateral. Diabetic Neuropathy. 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 PVD. Tinea pedis bilateral.   Plan: Debrided all nails.  Return in 3 months or as needed.

## 2014-12-19 NOTE — Patient Instructions (Signed)
Seen for hypertrophic nails. All nails debrided. Return in 3 months or as needed.  

## 2014-12-28 ENCOUNTER — Ambulatory Visit: Payer: Medicare Other

## 2014-12-31 ENCOUNTER — Ambulatory Visit (INDEPENDENT_AMBULATORY_CARE_PROVIDER_SITE_OTHER): Payer: Medicare Other | Admitting: Pharmacotherapy

## 2014-12-31 ENCOUNTER — Encounter: Payer: Self-pay | Admitting: Pharmacotherapy

## 2014-12-31 VITALS — BP 130/82 | HR 81 | Temp 97.6°F | Resp 20 | Ht 62.0 in | Wt 149.6 lb

## 2014-12-31 DIAGNOSIS — Z794 Long term (current) use of insulin: Secondary | ICD-10-CM | POA: Diagnosis not present

## 2014-12-31 DIAGNOSIS — E1165 Type 2 diabetes mellitus with hyperglycemia: Secondary | ICD-10-CM

## 2014-12-31 DIAGNOSIS — Z23 Encounter for immunization: Secondary | ICD-10-CM | POA: Diagnosis not present

## 2014-12-31 DIAGNOSIS — E1121 Type 2 diabetes mellitus with diabetic nephropathy: Secondary | ICD-10-CM

## 2014-12-31 DIAGNOSIS — E1101 Type 2 diabetes mellitus with hyperosmolarity with coma: Secondary | ICD-10-CM | POA: Diagnosis not present

## 2014-12-31 DIAGNOSIS — I10 Essential (primary) hypertension: Secondary | ICD-10-CM

## 2014-12-31 DIAGNOSIS — IMO0002 Reserved for concepts with insufficient information to code with codable children: Secondary | ICD-10-CM

## 2014-12-31 MED ORDER — INSULIN GLARGINE 100 UNIT/ML SOLOSTAR PEN: 30.0000 [IU] | PEN_INJECTOR | Freq: Every day | SUBCUTANEOUS | Status: DC

## 2014-12-31 NOTE — Progress Notes (Signed)
  Subjective:    Allison [name] is a 80 y.o.African American female who presents for follow-up of Type 2 diabetes mellitus.   Current A1C is 10.8% Continues to miss doses of insulin.  Says she falls asleep and forgets to take it.  BG: 152-402 No hypoglycemia.  Trying to make healthy food choices. Drinking water. She is walking for exercise. Some blurry vision.  Has dry eyes.  Wears glasses. Denies problems with feet.  Saw podiatrist. Nocturia each night. No peripheral edema. Complains of vaginal itching   Review of Systems A comprehensive review of systems was negative except for: Eyes: positive for contacts/glasses and dry eyes Genitourinary: positive for vaginal itching and nocturia Endocrine: positive for diabetic symptoms including blurry vision, increased fatigue, pruritus and skin dryness    Objective:    BP 130/82 mmHg  Pulse 81  Temp(Src) 97.6 F (36.4 C) (Oral)  Resp 20  Ht 5\' 2"  (1.575 m)  Wt 149 lb 9.6 oz (67.858 kg)  BMI 27.36 kg/m2  SpO2 98%  General:  alert, cooperative and no distress  Oropharynx: normal findings: lips normal without lesions and gums healthy   Eyes:  negative findings: lids and lashes normal and conjunctivae and sclerae normal   Ears:  external ears normal        Lung: clear to auscultation bilaterally  Heart:  regular rate and rhythm     Extremities: extremities normal, atraumatic, no cyanosis or edema  Skin: dry     Neuro: mental status, speech normal, alert and oriented x3 and gait and station normal   Lab Review GLUCOSE (mg/dL)  Date Value  10/19/2014 114*  06/26/2014 171*  03/23/2014 187*   GLUCOSE, BLD (mg/dL)  Date Value  06/03/2014 193*  08/07/2013 159*  08/06/2013 100*   CO2 (mmol/L)  Date Value  10/19/2014 27  06/26/2014 22  06/03/2014 27   BUN (mg/dL)  Date Value  10/19/2014 11  06/26/2014 8  06/03/2014 7  03/23/2014 12  08/07/2013 18  08/06/2013 35*   CREATININE, SER (mg/dL)  Date Value   10/19/2014 0.98  06/26/2014 0.83  06/03/2014 0.77       Assessment:    Diabetes Mellitus type II, under poor control.   BP at goal <140/90    Plan:    1.  Rx changes: Increase Lantus 30 units daily  2.  Continue metformin.  3.  Counseled on complications of uncontrolled DM and importance of taking insulin as prescribed. 4.  Exercise goal is 30-45 minutes 5 x week 5.  BP at goal <140/90

## 2014-12-31 NOTE — Patient Instructions (Signed)
Increase Lantus 30 units daily

## 2015-01-09 ENCOUNTER — Telehealth: Payer: Self-pay | Admitting: *Deleted

## 2015-01-09 MED ORDER — ONETOUCH ULTRASOFT LANCETS MISC: Status: DC

## 2015-01-09 MED ORDER — GLUCOSE BLOOD VI STRP: ORAL_STRIP | Status: DC

## 2015-01-09 NOTE — Telephone Encounter (Signed)
Called and spoke with pharmacist and called in test supplies

## 2015-01-09 NOTE — Telephone Encounter (Signed)
Patient called requesting Diabetic supplies for blood sugar machine. Called and LMOM needing to know what type of Machine and how often she test blood sugar.

## 2015-01-11 ENCOUNTER — Other Ambulatory Visit: Payer: Self-pay

## 2015-01-11 MED ORDER — GLUCOSE BLOOD VI STRP: ORAL_STRIP | Status: DC

## 2015-01-11 MED ORDER — ONETOUCH ULTRASOFT LANCETS MISC: Status: DC

## 2015-01-15 ENCOUNTER — Telehealth: Payer: Self-pay | Admitting: *Deleted

## 2015-01-15 NOTE — Telephone Encounter (Signed)
Received fax from [facility] for One Touch Ultrasoft Lancets. Filled out and given to Dr. Eulas Post to sign. To be faxed back to [facility]

## 2015-02-11 ENCOUNTER — Encounter: Payer: Self-pay | Admitting: Internal Medicine

## 2015-02-11 ENCOUNTER — Ambulatory Visit: Payer: Medicare Other | Admitting: Pharmacotherapy

## 2015-02-14 ENCOUNTER — Other Ambulatory Visit: Payer: Self-pay | Admitting: *Deleted

## 2015-02-14 DIAGNOSIS — E118 Type 2 diabetes mellitus with unspecified complications: Secondary | ICD-10-CM

## 2015-02-14 MED ORDER — LEVOTHYROXINE SODIUM 175 MCG PO TABS: 175.0000 ug | ORAL_TABLET | Freq: Every morning | ORAL | Status: DC

## 2015-02-14 MED ORDER — METFORMIN HCL 500 MG PO TABS: 1000.0000 mg | ORAL_TABLET | Freq: Two times a day (BID) | ORAL | Status: DC

## 2015-02-14 MED ORDER — AMLODIPINE BESY-BENAZEPRIL HCL 10-40 MG PO CAPS: ORAL_CAPSULE | ORAL | Status: DC

## 2015-02-18 ENCOUNTER — Encounter: Payer: Self-pay | Admitting: Internal Medicine

## 2015-02-18 ENCOUNTER — Ambulatory Visit: Payer: Medicare Other | Admitting: Pharmacotherapy

## 2015-02-20 ENCOUNTER — Other Ambulatory Visit: Payer: Medicare Other

## 2015-02-20 DIAGNOSIS — E785 Hyperlipidemia, unspecified: Secondary | ICD-10-CM | POA: Diagnosis not present

## 2015-02-20 DIAGNOSIS — E1165 Type 2 diabetes mellitus with hyperglycemia: Secondary | ICD-10-CM

## 2015-02-20 DIAGNOSIS — E039 Hypothyroidism, unspecified: Secondary | ICD-10-CM | POA: Diagnosis not present

## 2015-02-20 DIAGNOSIS — IMO0002 Reserved for concepts with insufficient information to code with codable children: Secondary | ICD-10-CM

## 2015-02-20 DIAGNOSIS — I10 Essential (primary) hypertension: Secondary | ICD-10-CM

## 2015-02-20 DIAGNOSIS — E1140 Type 2 diabetes mellitus with diabetic neuropathy, unspecified: Secondary | ICD-10-CM

## 2015-02-21 LAB — CBC WITH DIFFERENTIAL/PLATELET
BASOS ABS: 0.1 10*3/uL (ref 0.0–0.2)
Basos: 1 %
EOS (ABSOLUTE): 0.4 10*3/uL (ref 0.0–0.4)
Eos: 5 %
HEMOGLOBIN: 12.6 g/dL (ref 11.1–15.9)
Hematocrit: 38.2 % (ref 34.0–46.6)
Immature Grans (Abs): 0 10*3/uL (ref 0.0–0.1)
Immature Granulocytes: 0 %
LYMPHS ABS: 2.8 10*3/uL (ref 0.7–3.1)
Lymphs: 35 %
MCH: 26.7 pg (ref 26.6–33.0)
MCHC: 33 g/dL (ref 31.5–35.7)
MCV: 81 fL (ref 79–97)
MONOCYTES: 9 %
Monocytes Absolute: 0.7 10*3/uL (ref 0.1–0.9)
NEUTROS ABS: 4.1 10*3/uL (ref 1.4–7.0)
Neutrophils: 50 %
Platelets: 251 10*3/uL (ref 150–379)
RBC: 4.72 x10E6/uL (ref 3.77–5.28)
RDW: 14.2 % (ref 12.3–15.4)
WBC: 8.1 10*3/uL (ref 3.4–10.8)

## 2015-02-21 LAB — COMPREHENSIVE METABOLIC PANEL
ALBUMIN: 3.8 g/dL (ref 3.5–4.8)
ALK PHOS: 81 IU/L (ref 39–117)
ALT: 10 IU/L (ref 0–32)
AST: 9 IU/L (ref 0–40)
Albumin/Globulin Ratio: 1.4 (ref 1.1–2.5)
BUN / CREAT RATIO: 15 (ref 11–26)
BUN: 12 mg/dL (ref 8–27)
Bilirubin Total: 0.3 mg/dL (ref 0.0–1.2)
CHLORIDE: 99 mmol/L (ref 96–106)
CO2: 26 mmol/L (ref 18–29)
Calcium: 9.7 mg/dL (ref 8.7–10.3)
Creatinine, Ser: 0.8 mg/dL (ref 0.57–1.00)
GFR calc Af Amer: 82 mL/min/{1.73_m2} (ref >59)
GFR calc non Af Amer: 71 mL/min/{1.73_m2} (ref >59)
GLOBULIN, TOTAL: 2.7 g/dL (ref 1.5–4.5)
GLUCOSE: 266 mg/dL — AB (ref 65–99)
Potassium: 3.7 mmol/L (ref 3.5–5.2)
SODIUM: 141 mmol/L (ref 134–144)
Total Protein: 6.5 g/dL (ref 6.0–8.5)

## 2015-02-21 LAB — LIPID PANEL
CHOL/HDL RATIO: 5.4 ratio — AB (ref 0.0–4.4)
Cholesterol, Total: 260 mg/dL — ABNORMAL HIGH (ref 100–199)
HDL: 48 mg/dL (ref >39)
LDL CALC: 189 mg/dL — AB (ref 0–99)
TRIGLYCERIDES: 117 mg/dL (ref 0–149)
VLDL Cholesterol Cal: 23 mg/dL (ref 5–40)

## 2015-02-21 LAB — TSH: TSH: 3.17 u[IU]/mL (ref 0.450–4.500)

## 2015-02-21 LAB — HEMOGLOBIN A1C
ESTIMATED AVERAGE GLUCOSE: 283 mg/dL
HEMOGLOBIN A1C: 11.5 % — AB (ref 4.8–5.6)

## 2015-02-22 ENCOUNTER — Encounter: Payer: Self-pay | Admitting: Internal Medicine

## 2015-02-22 ENCOUNTER — Ambulatory Visit (INDEPENDENT_AMBULATORY_CARE_PROVIDER_SITE_OTHER): Payer: Medicare Other | Admitting: Internal Medicine

## 2015-02-22 VITALS — BP 118/76 | HR 81 | Temp 97.8°F | Resp 20 | Wt 146.8 lb

## 2015-02-22 DIAGNOSIS — Z91148 Patient's other noncompliance with medication regimen for other reason: Secondary | ICD-10-CM

## 2015-02-22 DIAGNOSIS — E039 Hypothyroidism, unspecified: Secondary | ICD-10-CM | POA: Diagnosis not present

## 2015-02-22 DIAGNOSIS — E785 Hyperlipidemia, unspecified: Secondary | ICD-10-CM | POA: Diagnosis not present

## 2015-02-22 DIAGNOSIS — E1121 Type 2 diabetes mellitus with diabetic nephropathy: Secondary | ICD-10-CM | POA: Diagnosis not present

## 2015-02-22 DIAGNOSIS — E1165 Type 2 diabetes mellitus with hyperglycemia: Secondary | ICD-10-CM | POA: Diagnosis not present

## 2015-02-22 DIAGNOSIS — K219 Gastro-esophageal reflux disease without esophagitis: Secondary | ICD-10-CM | POA: Diagnosis not present

## 2015-02-22 DIAGNOSIS — I10 Essential (primary) hypertension: Secondary | ICD-10-CM | POA: Diagnosis not present

## 2015-02-22 DIAGNOSIS — IMO0002 Reserved for concepts with insufficient information to code with codable children: Secondary | ICD-10-CM

## 2015-02-22 DIAGNOSIS — Z9114 Patient's other noncompliance with medication regimen: Secondary | ICD-10-CM

## 2015-02-22 DIAGNOSIS — Z794 Long term (current) use of insulin: Secondary | ICD-10-CM

## 2015-02-22 MED ORDER — INSULIN GLARGINE 100 UNIT/ML SOLOSTAR PEN: 35.0000 [IU] | PEN_INJECTOR | Freq: Every morning | SUBCUTANEOUS | Status: DC

## 2015-02-22 NOTE — Patient Instructions (Addendum)
Increase lantus 35 units. Inject in AM  Check blood sugars 3 times daily  Take atorvastatin at bedtime  Continue other medications as ordered  Follow up with Tivis Ringer as scheduled  Follow up in 3 mos for routine visit. Fasting labs prior to appt

## 2015-02-22 NOTE — Progress Notes (Signed)
Patient ID: Allison [name], female   DOB: 1934/09/17, 81 y.o.   MRN: [id number]    Location:    [facility]   Place of Service:   OFFICE  Chief Complaint  Patient presents with  . Medical Management of Chronic Issues    4 month follow-up for routine visit, Labs printed    HPI:  81 yo female seen today for f/u. She admits to noncompliance with medication and diet. Has polydipsia. She drinks pepsi's frequently along with russian tea and coffee. She has increased frequency at night most nights. Followed by [facility]rologist at [facility]  HTN - stable on lotrel.   Hyperlipidemia - LDL 189 (not at goal) on current dose of statin. No myalgias  Thyroid - stable on levothyroxine. No constipation, diarrhea, increased anxiety/depression, palpitations or wt fluctuations  DM - uncontrolled with A1c 11.5%. BS fluctuating 300-400s . She takes lantus 20 units and metformin. Numbness in fingers. Takes gabapentin. She has an eye appt next week. 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 Uses magnifying glass to read at times  GERD - reflux sx's stable on omeprazole  Past Medical History  Diagnosis Date  . SOB (shortness of breath)   . Cough   . Dysphagia   . Hiatal hernia   . Diabetes mellitus without complication ([place])   . Hypertension   . Onychomycosis 03/05/2012  . Pain in lower limb 04/18/2013  . Vertigo   . Sciatic nerve pain   . GERD (gastroesophageal reflux disease)   . HLD (hyperlipidemia)   . Hypothyroidism   . Carpal tunnel syndrome   . Hx of adenomatous colonic polyps 06/19/2014    Past Surgical History  Procedure Laterality Date  . Vesicovaginal fistula closure w/ tah  1976  . Partial hysterectomy  1978  . Bladder surgery      Mesh implant  . Colonoscopy    . Larynx surgery      vocal cords    Patient Care Team: Gildardo Cranker, DO as PCP - General (Internal Medicine)  Social History   Social History  . Marital Status: Single    Spouse Name: N/A  . Number of Children: 2  . Years of Education: N/A    Occupational History  . Retired    Social History Main Topics  . Smoking status: Never Smoker   . Smokeless tobacco: Never Used  . Alcohol Use: No  . Drug Use: No  . Sexual Activity: No   Other Topics Concern  . Not on file   Social History Narrative   Single   2 sons   Retired   2 cups caffeine daily   05/01/2014        reports that she has never smoked. She has never used smokeless tobacco. She reports that she does not drink alcohol or use illicit drugs.  Allergies  Allergen Reactions  . Pineapple Anaphylaxis  . Codeine Other (See Comments)    unknown  . Invokana [Canagliflozin] Other (See Comments)    Constipation, nightmares  . Penicillins Other (See Comments)    Unknown   . Tradjenta [Linagliptin] Other (See Comments)  . Vioxx [Rofecoxib] Other (See Comments)    unknown    Medications: Patient's Medications  New Prescriptions   No medications on file  Previous Medications   ALBUTEROL (PROVENTIL HFA;VENTOLIN HFA) 108 (90 BASE) MCG/ACT INHALER    Inhale 2 puffs into the lungs every 4 (four) hours as needed for wheezing or shortness of breath.   AMBULATORY NON FORMULARY MEDICATION  Apply to your wrist daily   AMLODIPINE-BENAZEPRIL (LOTREL) 10-40 MG CAPSULE    Take one capsule by mouth once daily for blood pressure   ASPIRIN EC 81 MG TABLET    Take 1 tablet (81 mg total) by mouth daily.   ATORVASTATIN (LIPITOR) 40 MG TABLET    Take 1 tablet (40 mg total) by mouth at bedtime.   CHLORTHALIDONE (HYGROTON) 25 MG TABLET    take 1 tablet by mouth once daily   GABAPENTIN (NEURONTIN) 100 MG CAPSULE    Take 2 capsules (200 mg total) by mouth 3 (three) times daily. For neuropathy   GLUCOSE BLOOD TEST STRIP    Use to test blood sugar three times daily.  Dx: E11.21   INSULIN GLARGINE (LANTUS SOLOSTAR) 100 UNIT/ML SOLOSTAR PEN    Inject 30 Units into the skin daily at 10 pm.   INSULIN PEN NEEDLE 32G X 4 MM MISC    1 each by Does not apply route daily.   LANCETS  (ONETOUCH ULTRASOFT) LANCETS    Use to test blood sugar three times daily. Dx: E11.21   LEVOTHYROXINE (SYNTHROID, LEVOTHROID) 175 MCG TABLET    Take 1 tablet (175 mcg total) by mouth every morning.   METFORMIN (GLUCOPHAGE) 500 MG TABLET    Take 2 tablets (1,000 mg total) by mouth 2 (two) times daily with a meal.   OMEPRAZOLE (PRILOSEC) 20 MG CAPSULE    Take 1 capsule (20 mg total) by mouth daily.   VITAMIN E (VITAMIN E) 1000 UNIT CAPSULE    Take 1,000 Units by mouth daily.  Modified Medications   No medications on file  Discontinued Medications   No medications on file    Review of Systems  Endocrine: Positive for polydipsia and polyuria.  Neurological: Positive for numbness.  All other systems reviewed and are negative.   Filed Vitals:   02/22/15 0820  BP: 118/76  Pulse: 81  Temp: 97.8 F (36.6 C)  TempSrc: Oral  Resp: 20  Weight: 146 lb 12.8 oz (66.588 kg)  SpO2: 98%   Body mass index is 26.84 kg/(m^2).  Physical Exam  Constitutional: She is oriented to person, place, and time. She appears well-developed and well-nourished. No distress.  HENT:  Mouth/Throat: Oropharynx is clear and moist. No oropharyngeal exudate.  Eyes: Pupils are equal, round, and reactive to light. No scleral icterus.  Neck: Neck supple. Carotid bruit is not present. No tracheal deviation present. No thyromegaly present.  Cardiovascular: Normal rate, regular rhythm, normal heart sounds and intact distal pulses.  Exam reveals no gallop and no friction rub.   No murmur heard. No LE edema b/l. no calf TTP.   Pulmonary/Chest: Effort normal and breath sounds normal. No stridor. No respiratory distress. She has no wheezes. She has no rales.  Abdominal: Soft. Bowel sounds are normal. She exhibits no distension and no mass. There is no hepatomegaly. There is no tenderness. There is no rebound and no guarding.  Musculoskeletal: She exhibits edema.  Lymphadenopathy:    She has no cervical adenopathy.   Neurological: She is alert and oriented to person, place, and time.  Skin: Skin is warm and dry. No rash noted.  Psychiatric: She has a normal mood and affect. Her behavior is normal. Judgment and thought content normal.     Labs reviewed: Lab on 02/20/2015  Component Date Value Ref Range Status  . Cholesterol, Total 02/20/2015 260* 100 - 199 mg/dL Final  . Triglycerides 02/20/2015 117  0 - 149 mg/dL  Final  . HDL 02/20/2015 48  >39 mg/dL Final  . VLDL Cholesterol Cal 02/20/2015 23  5 - 40 mg/dL Final  . LDL Calculated 02/20/2015 189* 0 - 99 mg/dL Final  . Chol/HDL Ratio 02/20/2015 5.4* 0.0 - 4.4 ratio units Final   Comment:                                   T. Chol/HDL Ratio                                             Men  Women                               1/2 Avg.Risk  3.4    3.3                                   Avg.Risk  5.0    4.4                                2X Avg.Risk  9.6    7.1                                3X Avg.Risk 23.4   11.0   . Hgb A1c MFr Bld 02/20/2015 11.5* 4.8 - 5.6 % Final   Comment:          Pre-diabetes: 5.7 - 6.4          Diabetes: >6.4          Glycemic control for adults with diabetes: <7.0   . Est. average glucose Bld gHb Est-m* 02/20/2015 283   Final  . TSH 02/20/2015 3.170  0.450 - 4.500 uIU/mL Final  . WBC 02/20/2015 8.1  3.4 - 10.8 x10E3/uL Final  . RBC 02/20/2015 4.72  3.77 - 5.28 x10E6/uL Final  . Hemoglobin 02/20/2015 12.6  11.1 - 15.9 g/dL Final  . Hematocrit 02/20/2015 38.2  34.0 - 46.6 % Final  . MCV 02/20/2015 81  79 - 97 fL Final  . MCH 02/20/2015 26.7  26.6 - 33.0 pg Final  . MCHC 02/20/2015 33.0  31.5 - 35.7 g/dL Final  . RDW 02/20/2015 14.2  12.3 - 15.4 % Final  . Platelets 02/20/2015 251  150 - 379 x10E3/uL Final  . Neutrophils 02/20/2015 50   Final  . Lymphs 02/20/2015 35   Final  . Monocytes 02/20/2015 9   Final  . Eos 02/20/2015 5   Final  . Basos 02/20/2015 1   Final  . Neutrophils Absolute 02/20/2015 4.1  1.4 - 7.0  x10E3/uL Final  . Lymphocytes Absolute 02/20/2015 2.8  0.7 - 3.1 x10E3/uL Final  . Monocytes Absolute 02/20/2015 0.7  0.1 - 0.9 x10E3/uL Final  . EOS (ABSOLUTE) 02/20/2015 0.4  0.0 - 0.4 x10E3/uL Final  . Basophils Absolute 02/20/2015 0.1  0.0 - 0.2 x10E3/uL Final  . Immature Granulocytes 02/20/2015 0   Final  . Immature Grans (Abs) 02/20/2015 0.0  0.0 - 0.1 x10E3/uL Final  . Glucose 02/20/2015 266* 65 - 99  mg/dL Final  . BUN 02/20/2015 12  8 - 27 mg/dL Final  . Creatinine, Ser 02/20/2015 0.80  0.57 - 1.00 mg/dL Final  . GFR calc non Af Amer 02/20/2015 71  >59 mL/min/1.73 Final  . GFR calc Af Amer 02/20/2015 82  >59 mL/min/1.73 Final  . BUN/Creatinine Ratio 02/20/2015 15  11 - 26 Final  . Sodium 02/20/2015 141  134 - 144 mmol/L Final  . Potassium 02/20/2015 3.7  3.5 - 5.2 mmol/L Final  . Chloride 02/20/2015 99  96 - 106 mmol/L Final  . CO2 02/20/2015 26  18 - 29 mmol/L Final  . Calcium 02/20/2015 9.7  8.7 - 10.3 mg/dL Final  . Total Protein 02/20/2015 6.5  6.0 - 8.5 g/dL Final  . Albumin 02/20/2015 3.8  3.5 - 4.8 g/dL Final  . Globulin, Total 02/20/2015 2.7  1.5 - 4.5 g/dL Final  . Albumin/Globulin Ratio 02/20/2015 1.4  1.1 - 2.5 Final  . Bilirubin Total 02/20/2015 0.3  0.0 - 1.2 mg/dL Final  . Alkaline Phosphatase 02/20/2015 81  39 - 117 IU/L Final  . AST 02/20/2015 9  0 - 40 IU/L Final  . ALT 02/20/2015 10  0 - 32 IU/L Final    No results found.   Assessment/Plan   ICD-9-CM ICD-10-CM   1. Uncontrolled type 2 diabetes mellitus with diabetic nephropathy, with long-term current use of insulin (HCC) 250.42 E11.21    583.81 E11.65    V58.67 Z79.4   2. Hyperlipidemia LDL goal <100 272.4 E78.5   3. Essential hypertension 401.9 I10   4. Hypothyroidism, unspecified hypothyroidism type 244.9 E03.9   5. Gastroesophageal reflux disease, esophagitis presence not specified 530.81 K21.9   6. Uncontrolled type 2 diabetes mellitus with diabetic nephropathy, without long-term current use of  insulin (HCC) 250.42 E11.21 Insulin Glargine (LANTUS SOLOSTAR) 100 UNIT/ML Solostar Pen   583.81 E11.65   7       Noncompliance with medications   Increase lantus 35 units. Inject in AM  Check blood sugars 3 times daily  Avoid complex CHO  Take atorvastatin at bedtime  Continue other medications as ordered  Follow up with Tivis Ringer as scheduled  Follow up in 3 mos for routine visit. Fasting labs prior to appt   Morton S. Perlie Gold  [facility] and [facility] [address] [place], [place] [address] [phone] Cell (Monday-Friday 8 AM - 5 PM) [phone] After 5 PM and follow prompts

## 2015-03-04 ENCOUNTER — Ambulatory Visit: Payer: Medicare Other

## 2015-03-11 DIAGNOSIS — Z87448 Personal history of other diseases of urinary system: Secondary | ICD-10-CM | POA: Diagnosis not present

## 2015-03-11 DIAGNOSIS — R339 Retention of urine, unspecified: Secondary | ICD-10-CM | POA: Diagnosis not present

## 2015-03-11 DIAGNOSIS — R350 Frequency of micturition: Secondary | ICD-10-CM | POA: Diagnosis not present

## 2015-03-15 ENCOUNTER — Ambulatory Visit
Admission: RE | Admit: 2015-03-15 | Discharge: 2015-03-15 | Disposition: A | Discharge status: Home / Self Care | Payer: Medicare Other | Source: Ambulatory Visit

## 2015-03-15 DIAGNOSIS — Z1231 Encounter for screening mammogram for malignant neoplasm of breast: Secondary | ICD-10-CM

## 2015-03-18 ENCOUNTER — Ambulatory Visit (INDEPENDENT_AMBULATORY_CARE_PROVIDER_SITE_OTHER): Payer: Medicare Other | Admitting: Pharmacotherapy

## 2015-03-18 ENCOUNTER — Encounter: Payer: Self-pay | Admitting: Pharmacotherapy

## 2015-03-18 VITALS — BP 138/60 | HR 73 | Temp 96.9°F | Wt 148.0 lb

## 2015-03-18 DIAGNOSIS — Z794 Long term (current) use of insulin: Secondary | ICD-10-CM | POA: Diagnosis not present

## 2015-03-18 DIAGNOSIS — E1165 Type 2 diabetes mellitus with hyperglycemia: Secondary | ICD-10-CM | POA: Diagnosis not present

## 2015-03-18 DIAGNOSIS — IMO0002 Reserved for concepts with insufficient information to code with codable children: Secondary | ICD-10-CM

## 2015-03-18 DIAGNOSIS — E1121 Type 2 diabetes mellitus with diabetic nephropathy: Secondary | ICD-10-CM

## 2015-03-18 DIAGNOSIS — I10 Essential (primary) hypertension: Secondary | ICD-10-CM | POA: Diagnosis not present

## 2015-03-18 NOTE — Progress Notes (Signed)
  Subjective:    Allison [name] is a 81 y.o. African American  female who presents for follow-up of Type 2 diabetes mellitus.  Her A1C is up to 11.5%.  Dr. Eulas Post advised her to increase her insulin to 35 units daily.  She says when she does take it - she only takes 20 units. Says she can't take her insulin when she gets busy. She admits to missing some doses.  Looking at her medications, her insulin was last filled 10/30/14.  She still has 2 pens left. She has not been taking insulin.  Trying to make healthy food choices.  Drinks Ginger Ale . No routine exercise.  "I do a lot of moving". Wears glasses.  Has blurry vision, dry eyes. Some peripheral edema. Her feet are dry. Nocturia at least twice a night.  Her average BG is >400  Review of Systems A comprehensive review of systems was negative except for: Eyes: positive for contacts/glasses and dry eyes Cardiovascular: positive for lower extremity edema Genitourinary: positive for nocturia Integument/breast: positive for dryness Endocrine: positive for diabetic symptoms including blurry vision, polydipsia, polyuria and skin dryness    Objective:    BP 138/60 mmHg  Pulse 73  Temp(Src) 96.9 F (36.1 C) (Oral)  Wt 148 lb (67.132 kg)  SpO2 99%  General:  alert, cooperative and no distress  Oropharynx: normal findings: lips normal without lesions and gums healthy   Eyes:  negative findings: lids and lashes normal and conjunctivae and sclerae normal   Ears:  external ears normal        Lung: clear to auscultation bilaterally  Heart:  regular rate and rhythm     Extremities: edema trace bilaterally  Skin: dry     Neuro: mental status, speech normal, alert and oriented x3 and gait and station normal   Lab Review GLUCOSE (mg/dL)  Date Value  02/20/2015 266*  10/19/2014 114*  06/26/2014 171*   GLUCOSE, BLD (mg/dL)  Date Value  06/03/2014 193*  08/07/2013 159*  08/06/2013 100*   CO2 (mmol/L)  Date Value   02/20/2015 26  10/19/2014 27  06/26/2014 22   BUN (mg/dL)  Date Value  02/20/2015 12  10/19/2014 11  06/26/2014 8  06/03/2014 7  08/07/2013 18  08/06/2013 35*   CREATININE, SER (mg/dL)  Date Value  02/20/2015 0.80  10/19/2014 0.98  06/26/2014 0.83       Assessment:    Diabetes Mellitus type II, under poor control. A1C above goal <7%.  A1C actually higher than before - now 11.4% BP at goal <140/90   Plan:    1.  Rx changes: Needs to take Lantus DAILY at 35 units 2.  Counseled at length on why she needs to take medication exactly as prescribed.  Had her give herself an injection of Lantus in the office today.  Observed technique and advised her to hold it in for a 5 count before removing the needle from her abdomen. 3.  Counseled on complications of uncontrolled DM.  Explained that many of her complaints - dry mouth, dry eyes, dry skin, polyuria, nocturia, peripheral neuropathy, "foggy brain" are all a result of hyperglycemia. 4.  Continue Metformin 1000mg  BID. 5.  BP at goal <140/90. 6.  RTC in 6 weeks.

## 2015-03-18 NOTE — Patient Instructions (Signed)
You MUST take your insulin - 35 units every morning.

## 2015-03-19 ENCOUNTER — Encounter: Payer: Self-pay | Admitting: Podiatry

## 2015-03-19 ENCOUNTER — Ambulatory Visit (INDEPENDENT_AMBULATORY_CARE_PROVIDER_SITE_OTHER): Payer: Medicare Other | Admitting: Podiatry

## 2015-03-19 VITALS — BP 159/75 | HR 86

## 2015-03-19 DIAGNOSIS — M79606 Pain in leg, unspecified: Secondary | ICD-10-CM | POA: Diagnosis not present

## 2015-03-19 DIAGNOSIS — B351 Tinea unguium: Secondary | ICD-10-CM | POA: Diagnosis not present

## 2015-03-19 NOTE — Patient Instructions (Signed)
Seen for hypertrophic nails. All nails debrided. Return in 3 months or as needed.  

## 2015-03-19 NOTE — Progress Notes (Signed)
Subjective: 81 year old female presents complaining of painful toe nails.  Stated that she is taking insulin now.  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 Thick dystrophic nails x 10.  Pedal pulses are not palpable.  No edema or erythema noted. No abnormal skin lesions noted.  Dry peeling skin plantar heels bilateral.   Assessment: Mycotic nails bilateral. Diabetic Neuropathy. 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 PVD. Tinea pedis bilateral.   Plan: Debrided all nails.  Return in 3 months or as needed.

## 2015-04-08 ENCOUNTER — Encounter: Payer: Self-pay | Admitting: Pharmacotherapy

## 2015-04-08 ENCOUNTER — Ambulatory Visit (INDEPENDENT_AMBULATORY_CARE_PROVIDER_SITE_OTHER): Payer: Medicare Other | Admitting: Pharmacotherapy

## 2015-04-08 VITALS — BP 110/76 | HR 90 | Temp 97.6°F | Resp 20 | Ht 62.0 in | Wt 151.0 lb

## 2015-04-08 DIAGNOSIS — E1121 Type 2 diabetes mellitus with diabetic nephropathy: Secondary | ICD-10-CM | POA: Diagnosis not present

## 2015-04-08 DIAGNOSIS — IMO0002 Reserved for concepts with insufficient information to code with codable children: Secondary | ICD-10-CM

## 2015-04-08 DIAGNOSIS — E1165 Type 2 diabetes mellitus with hyperglycemia: Secondary | ICD-10-CM | POA: Diagnosis not present

## 2015-04-08 DIAGNOSIS — I10 Essential (primary) hypertension: Secondary | ICD-10-CM | POA: Diagnosis not present

## 2015-04-08 DIAGNOSIS — Z794 Long term (current) use of insulin: Secondary | ICD-10-CM

## 2015-04-08 NOTE — Progress Notes (Signed)
  Subjective:    Allison [name] is a 81 y.o.African American female who presents for follow-up of Type 2 diabetes mellitus.  Her last A1C was high at 11.5% She has poor glycemic control due to long standing non-compliance with her medications.  She needs to take insulin, but finds reasons why she cannot.  She has exhausted any and all oral agents.  We have had many discussions on complications of uncontrolled DM.  Today her complaint is that she "bends the needle" every time she attempts to give herself an injection.  Her injection technique is poor.  She is hesitating actually putting the needle in the skin - bounces several times before she actually injects the insulin.  Logbook shows BG:  98-308.  When she actually takes her Lantus, has a fasting BG of <156m/dl No hypoglycemia noted.   Review of Systems A comprehensive review of systems was negative except for: Eyes: positive for contacts/glasses Cardiovascular: positive for lower extremity edema Genitourinary: positive for nocturia    Objective:    BP 110/76 mmHg  Pulse 90  Temp(Src) 97.6 F (36.4 C) (Oral)  Resp 20  Ht 5' 2"$  (1.575 m)  Wt 151 lb (68.493 kg)  BMI 27.61 kg/m2  SpO2 98%  General:  alert, cooperative and no distress  Oropharynx: normal findings: lips normal without lesions and gums healthy   Eyes:  negative findings: lids and lashes normal and conjunctivae and sclerae normal   Ears:  external ears normal        Lung: clear to auscultation bilaterally  Heart:  regular rate and rhythm     Extremities: edema trace edema both lower extremities  Skin: dry     Neuro: mental status, speech normal, alert and oriented x3 and gait and station normal   Lab Review GLUCOSE (mg/dL)  Date Value  02/20/2015 266*  10/19/2014 114*  06/26/2014 171*   GLUCOSE, BLD (mg/dL)  Date Value  06/03/2014 193*  08/07/2013 159*  08/06/2013 100*   CO2 (mmol/L)  Date Value  02/20/2015 26  10/19/2014 27  06/26/2014 22    BUN (mg/dL)  Date Value  02/20/2015 12  10/19/2014 11  06/26/2014 8  06/03/2014 7  08/07/2013 18  08/06/2013 35*   CREATININE, SER (mg/dL)  Date Value  02/20/2015 0.80  10/19/2014 0.98  06/26/2014 0.83       Assessment:    Diabetes Mellitus type II, under poor control. A1C above goal <7% BP at goal <140/90   Plan:    1.  Rx changes: none 2.  Education:  Instructed on proper injection technique.  She was able to deliver a dose of Lantus in office without problems. 3.  Continue Lantus 35 units daily. 4.  Continue metformin 10055mdaily. 5.  Counseled on hypoglycemia. 6.  Counseled on complications of uncontrolled DM. 7.  Keep scheduled routine follow up.

## 2015-04-15 DIAGNOSIS — H1013 Acute atopic conjunctivitis, bilateral: Secondary | ICD-10-CM | POA: Diagnosis not present

## 2015-04-29 ENCOUNTER — Ambulatory Visit: Payer: Medicare Other | Admitting: Pharmacotherapy

## 2015-05-06 ENCOUNTER — Encounter: Payer: Self-pay | Admitting: Pharmacotherapy

## 2015-05-06 ENCOUNTER — Ambulatory Visit (INDEPENDENT_AMBULATORY_CARE_PROVIDER_SITE_OTHER): Payer: Medicare Other | Admitting: Pharmacotherapy

## 2015-05-06 VITALS — BP 140/80 | HR 89 | Temp 97.9°F | Ht 62.0 in | Wt 153.0 lb

## 2015-05-06 DIAGNOSIS — Z794 Long term (current) use of insulin: Secondary | ICD-10-CM

## 2015-05-06 DIAGNOSIS — I10 Essential (primary) hypertension: Secondary | ICD-10-CM | POA: Diagnosis not present

## 2015-05-06 DIAGNOSIS — IMO0002 Reserved for concepts with insufficient information to code with codable children: Secondary | ICD-10-CM

## 2015-05-06 DIAGNOSIS — E1121 Type 2 diabetes mellitus with diabetic nephropathy: Secondary | ICD-10-CM

## 2015-05-06 DIAGNOSIS — E1165 Type 2 diabetes mellitus with hyperglycemia: Secondary | ICD-10-CM | POA: Diagnosis not present

## 2015-05-06 NOTE — Progress Notes (Signed)
Subjective:    Allison [name] is a 81 y.o.African American female who presents for follow-up of Type 2 diabetes mellitus.   Her last A1C was 11.5%. Last month, again, we reviewed how to take insulin. She has not taken her insulin in 2 days "because my sugar was good in the morning" We are having a very difficult time in understanding the importance of DAILY insulin.  She continues to develop reasons why she doesn't take her medications as prescribed.  I have explained, multiple times, why she needs to take her medications as prescribed.  She has exhausted any and all oral medications for her diabetes mgmt.  She did not bring her blood glucose meter. She stopped taking the insulin if she saw a "good" blood sugar. She says, when she does take it, she does "fine" with taking her insulin.  Had 1 episode of night sweats.  Did not check her blood glucose. Current dose of Lantus is 35 units daily. Making healthier food choices.  Drinking less Pepsi. No routine exercise.  "I walk all the time" Complains of some nasal congestion.  Wears glasses.  Has dry eyes. Some peripheral edema. Some stasis dermatitis on ankles. Nocturia each night.   Review of Systems A comprehensive review of systems was negative except for: Eyes: positive for contacts/glasses and dry eyes Ears, nose, mouth, throat, and face: positive for nasal congestion Cardiovascular: positive for lower extremity edema Genitourinary: positive for nocturia Integument/breast: positive for dry skin Endocrine: positive for diabetic symptoms including blurry vision, pruritus and skin dryness    Objective:    BP 140/80 mmHg  Pulse 89  Temp(Src) 97.9 F (36.6 C) (Oral)  Ht 5' 2"$  (1.575 m)  Wt 153 lb (69.4 kg)  BMI 27.98 kg/m2  SpO2 98%  General:  alert, cooperative and no distress  Oropharynx: normal findings: lips normal without lesions and gums healthy   Eyes:  negative findings: lids and lashes normal and conjunctivae  and sclerae normal   Ears:  external ears normal        Lung: clear to auscultation bilaterally  Heart:  regular rate and rhythm     Extremities: edema bilateral lower extremities  Skin: dry and stasis dermatitis around ankles     Neuro: mental status, speech normal, alert and oriented x3 and sensation grossly normal   Lab Review GLUCOSE (mg/dL)  Date Value  02/20/2015 266*  10/19/2014 114*  06/26/2014 171*   GLUCOSE, BLD (mg/dL)  Date Value  06/03/2014 193*  08/07/2013 159*  08/06/2013 100*   CO2 (mmol/L)  Date Value  02/20/2015 26  10/19/2014 27  06/26/2014 22   BUN (mg/dL)  Date Value  02/20/2015 12  10/19/2014 11  06/26/2014 8  06/03/2014 7  08/07/2013 18  08/06/2013 35*   CREATININE, SER (mg/dL)  Date Value  02/20/2015 0.80  10/19/2014 0.98  06/26/2014 0.83       Assessment:    Diabetes Mellitus type II, under poor control.   BP at goal <140/90    Plan:    1.  Rx changes: none  2.  Explained, again,  Why she must take her insulin as prescribed.  IF she has fasting BG <70 she is to call office for dosage adjustment. 3.  Lantus 35 units daily. 4.  Counseled on complications of uncontrolled DM. 5.  Counseled on hypoglycemia. 6.  Counseled on skin care. 7.  Reviewed nutrition goals. 8.  Exercise goal is 30-45 minutes 5 x week. 9.  BP  at goal <140/90 10.  F/U A1C in 6 weeks.

## 2015-05-06 NOTE — Telephone Encounter (Signed)
This encounter was created in error - please disregard.

## 2015-05-06 NOTE — Patient Instructions (Signed)
Must take insulin every day. Call for dosage adjustment if sugar <70

## 2015-05-08 ENCOUNTER — Telehealth: Payer: Self-pay | Admitting: *Deleted

## 2015-05-08 MED ORDER — INSULIN DETEMIR 100 UNIT/ML FLEXPEN: PEN_INJECTOR | SUBCUTANEOUS | Status: DC

## 2015-05-08 NOTE — Telephone Encounter (Signed)
She probably is having a reaction to Lantus - recommend change to levemir same dose. F/u as scheduled

## 2015-05-08 NOTE — Telephone Encounter (Signed)
Patient called and stated that she saw Oretha Ellis on 05/06/2015 and she is thinking she is having an allergic reaction to the insulin prescribed. She moved her insulin from 20 to 35. Stated that she is sweating at night and changing cloths, going through 3 shirts a night. Blood sugar has been running fine between 100-112. Her hair is coming out by the hand fulls. Patient thinks she is on too much insulin. Please advise.

## 2015-05-08 NOTE — Telephone Encounter (Signed)
Patient notified and agreed. Medication list/Allergy List updated and medication faxed to pharmacy.

## 2015-05-22 ENCOUNTER — Other Ambulatory Visit: Payer: Medicare Other

## 2015-05-22 DIAGNOSIS — E039 Hypothyroidism, unspecified: Secondary | ICD-10-CM | POA: Diagnosis not present

## 2015-05-22 DIAGNOSIS — E1121 Type 2 diabetes mellitus with diabetic nephropathy: Secondary | ICD-10-CM

## 2015-05-22 DIAGNOSIS — IMO0002 Reserved for concepts with insufficient information to code with codable children: Secondary | ICD-10-CM

## 2015-05-22 DIAGNOSIS — E1165 Type 2 diabetes mellitus with hyperglycemia: Principal | ICD-10-CM

## 2015-05-22 DIAGNOSIS — Z794 Long term (current) use of insulin: Principal | ICD-10-CM

## 2015-05-22 DIAGNOSIS — E785 Hyperlipidemia, unspecified: Secondary | ICD-10-CM | POA: Diagnosis not present

## 2015-05-23 LAB — COMPREHENSIVE METABOLIC PANEL
ALT: 10 IU/L (ref 0–32)
AST: 11 IU/L (ref 0–40)
Albumin/Globulin Ratio: 1.3 (ref 1.2–2.2)
Albumin: 4 g/dL (ref 3.5–4.8)
Alkaline Phosphatase: 83 IU/L (ref 39–117)
BUN/Creatinine Ratio: 13 (ref 12–28)
BUN: 10 mg/dL (ref 8–27)
Bilirubin Total: 0.5 mg/dL (ref 0.0–1.2)
CO2: 27 mmol/L (ref 18–29)
Calcium: 9.7 mg/dL (ref 8.7–10.3)
Chloride: 96 mmol/L (ref 96–106)
Creatinine, Ser: 0.79 mg/dL (ref 0.57–1.00)
GFR calc Af Amer: 83 mL/min/{1.73_m2} (ref >59)
GFR calc non Af Amer: 72 mL/min/{1.73_m2} (ref >59)
Globulin, Total: 3 g/dL (ref 1.5–4.5)
Glucose: 231 mg/dL — ABNORMAL HIGH (ref 65–99)
Potassium: 4.3 mmol/L (ref 3.5–5.2)
Sodium: 139 mmol/L (ref 134–144)
Total Protein: 7 g/dL (ref 6.0–8.5)

## 2015-05-23 LAB — HEMOGLOBIN A1C
Est. average glucose Bld gHb Est-mCnc: 220 mg/dL
Hgb A1c MFr Bld: 9.3 % — ABNORMAL HIGH (ref 4.8–5.6)

## 2015-05-24 ENCOUNTER — Encounter: Payer: Self-pay | Admitting: Internal Medicine

## 2015-05-24 ENCOUNTER — Ambulatory Visit (INDEPENDENT_AMBULATORY_CARE_PROVIDER_SITE_OTHER): Payer: Medicare Other | Admitting: Internal Medicine

## 2015-05-24 VITALS — BP 134/78 | HR 91 | Temp 97.5°F | Resp 20 | Ht 62.0 in | Wt 152.0 lb

## 2015-05-24 DIAGNOSIS — L309 Dermatitis, unspecified: Secondary | ICD-10-CM

## 2015-05-24 DIAGNOSIS — I10 Essential (primary) hypertension: Secondary | ICD-10-CM | POA: Diagnosis not present

## 2015-05-24 DIAGNOSIS — E785 Hyperlipidemia, unspecified: Secondary | ICD-10-CM

## 2015-05-24 DIAGNOSIS — Z794 Long term (current) use of insulin: Secondary | ICD-10-CM | POA: Diagnosis not present

## 2015-05-24 DIAGNOSIS — E1142 Type 2 diabetes mellitus with diabetic polyneuropathy: Secondary | ICD-10-CM | POA: Diagnosis not present

## 2015-05-24 DIAGNOSIS — IMO0002 Reserved for concepts with insufficient information to code with codable children: Secondary | ICD-10-CM

## 2015-05-24 DIAGNOSIS — K219 Gastro-esophageal reflux disease without esophagitis: Secondary | ICD-10-CM

## 2015-05-24 DIAGNOSIS — E039 Hypothyroidism, unspecified: Secondary | ICD-10-CM

## 2015-05-24 DIAGNOSIS — D1724 Benign lipomatous neoplasm of skin and subcutaneous tissue of left leg: Secondary | ICD-10-CM

## 2015-05-24 DIAGNOSIS — E1165 Type 2 diabetes mellitus with hyperglycemia: Secondary | ICD-10-CM

## 2015-05-24 DIAGNOSIS — E1121 Type 2 diabetes mellitus with diabetic nephropathy: Secondary | ICD-10-CM

## 2015-05-24 MED ORDER — GLIPIZIDE ER 10 MG PO TB24: 10.0000 mg | ORAL_TABLET | Freq: Every day | ORAL | Status: DC

## 2015-05-24 MED ORDER — CLOTRIMAZOLE-BETAMETHASONE 1-0.05 % EX CREA: 1.0000 "application " | TOPICAL_CREAM | Freq: Two times a day (BID) | CUTANEOUS | Status: DC

## 2015-05-24 NOTE — Progress Notes (Signed)
Patient ID: Allison [name], female   DOB: 07-28-1934, 81 y.o.   MRN: [id number]    Location:   [facility]    Place of Service:   OFFICE  Chief Complaint  Patient presents with  . Medical Management of Chronic Issues    check feet for itching and pain,    HPI:  81 yo female seen today for f/u. She is having trouble with insulin and has had reaction to both levemir and lantus (swelling in lips/mouth, fatigue and itching at injection site). She has taken glipizide and actos in the past without a problem. She would like to try one of them again.  HTN - stable on lotrel.   Hyperlipidemia - LDL 189 (not at goal) on current dose of statin. No myalgias  Thyroid - stable on levothyroxine. No constipation, diarrhea, increased anxiety/depression, palpitations or wt fluctuations  DM - uncontrolled with A1c 9.3% (improved from 11.5%). BS fluctuating 200-300s. She takes lantus 35 units and metformin. Numbness in fingers. Takes gabapentin. She saw eye specialist in Feb 2017. No diabetic changes seen. 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 Uses magnifying glass to read at times  GERD - reflux sx's stable on omeprazole  Past Medical History  Diagnosis Date  . SOB (shortness of breath)   . Cough   . Dysphagia   . Hiatal hernia   . Diabetes mellitus without complication ([place])   . Hypertension   . Onychomycosis 03/05/2012  . Pain in lower limb 04/18/2013  . Vertigo   . Sciatic nerve pain   . GERD (gastroesophageal reflux disease)   . HLD (hyperlipidemia)   . Hypothyroidism   . Carpal tunnel syndrome   . Hx of adenomatous colonic polyps 06/19/2014    Past Surgical History  Procedure Laterality Date  . Vesicovaginal fistula closure w/ tah  1976  . Partial hysterectomy  1978  . Bladder surgery      Mesh implant  . Colonoscopy    . Larynx surgery      vocal cords    Patient Care Team: Gildardo Cranker, DO as PCP - General (Internal Medicine)  Social History   Social History  . Marital Status: Single   Spouse Name: N/A  . Number of Children: 2  . Years of Education: N/A   Occupational History  . Retired    Social History Main Topics  . Smoking status: Never Smoker   . Smokeless tobacco: Never Used  . Alcohol Use: No  . Drug Use: No  . Sexual Activity: No   Other Topics Concern  . Not on file   Social History Narrative   Single   2 sons   Retired   2 cups caffeine daily   05/01/2014        reports that she has never smoked. She has never used smokeless tobacco. She reports that she does not drink alcohol or use illicit drugs.  Allergies  Allergen Reactions  . Pineapple Anaphylaxis  . Codeine Other (See Comments)    unknown  . Invokana [Canagliflozin] Other (See Comments)    Constipation, nightmares  . Lantus [Insulin Glargine]   . Penicillins Other (See Comments)    Unknown   . Tradjenta [Linagliptin] Other (See Comments)  . Vioxx [Rofecoxib] Other (See Comments)    unknown    Medications: Patient's Medications  New Prescriptions   No medications on file  Previous Medications   ALBUTEROL (PROVENTIL HFA;VENTOLIN HFA) 108 (90 BASE) MCG/ACT INHALER    Inhale 2 puffs into  the lungs every 4 (four) hours as needed for wheezing or shortness of breath.   AMBULATORY NON FORMULARY MEDICATION    Apply to your wrist daily   AMLODIPINE-BENAZEPRIL (LOTREL) 10-40 MG CAPSULE    Take one capsule by mouth once daily for blood pressure   ASPIRIN EC 81 MG TABLET    Take 1 tablet (81 mg total) by mouth daily.   CHLORTHALIDONE (HYGROTON) 25 MG TABLET    take 1 tablet by mouth once daily   GABAPENTIN (NEURONTIN) 100 MG CAPSULE    Take 2 capsules (200 mg total) by mouth 3 (three) times daily. For neuropathy   GLUCOSE BLOOD TEST STRIP    Use to test blood sugar three times daily.  Dx: E11.21   INSULIN DETEMIR (LEVEMIR FLEXPEN) 100 UNIT/ML PEN    Inject 35 units into skin daily   INSULIN PEN NEEDLE 32G X 4 MM MISC    1 each by Does not apply route daily.   LANCETS (ONETOUCH ULTRASOFT)  LANCETS    Use to test blood sugar three times daily. Dx: E11.21   LEVOTHYROXINE (SYNTHROID, LEVOTHROID) 175 MCG TABLET    Take 1 tablet (175 mcg total) by mouth every morning.   METFORMIN (GLUCOPHAGE) 500 MG TABLET    Take 2 tablets (1,000 mg total) by mouth 2 (two) times daily with a meal.   OMEPRAZOLE (PRILOSEC) 20 MG CAPSULE    Take 1 capsule (20 mg total) by mouth daily.   VITAMIN E (VITAMIN E) 1000 UNIT CAPSULE    Take 1,000 Units by mouth daily.  Modified Medications   No medications on file  Discontinued Medications   No medications on file    Review of Systems  Constitutional: Positive for fatigue.  Musculoskeletal: Positive for arthralgias.  Skin: Positive for rash (on right foot x unknown duration, itchy).  Neurological: Positive for numbness.  All other systems reviewed and are negative.   Filed Vitals:   05/24/15 0924  BP: 134/78  Pulse: 91  Temp: 97.5 F (36.4 C)  TempSrc: Oral  Resp: 20  Height: 5\' 2"  (1.575 m)  Weight: 152 lb (68.947 kg)  SpO2: 97%   Body mass index is 27.79 kg/(m^2).  Physical Exam  Constitutional: She is oriented to person, place, and time. She appears well-developed and well-nourished. No distress.  HENT:  Mouth/Throat: Oropharynx is clear and moist. No oropharyngeal exudate.  Eyes: Pupils are equal, round, and reactive to light. No scleral icterus.  Neck: Neck supple. Carotid bruit is not present. No tracheal deviation present. No thyromegaly present.  Cardiovascular: Normal rate, regular rhythm, normal heart sounds and intact distal pulses.  Exam reveals no gallop and no friction rub.   No murmur heard. No LE edema b/l. no calf TTP.   Pulmonary/Chest: Effort normal and breath sounds normal. No stridor. No respiratory distress. She has no wheezes. She has no rales.  Abdominal: Soft. Bowel sounds are normal. She exhibits no distension and no mass. There is no hepatomegaly. There is no tenderness. There is no rebound and no guarding.    Musculoskeletal: She exhibits edema.  Lymphadenopathy:    She has no cervical adenopathy.  Neurological: She is alert and oriented to person, place, and time.  Skin: Skin is warm and dry. Rash (R>L lateral heel patchy papulosquamous rash. no vesicular formation) noted.  Psychiatric: She has a normal mood and affect. Her behavior is normal. Thought content normal.   Diabetic Foot Exam - Simple   Simple Foot Form  Diabetic Foot exam was performed with the following findings:  Yes 05/24/2015 10:21 AM  Visual Inspection  See comments:  Yes  Sensation Testing  Intact to touch and monofilament testing bilaterally:  Yes  Pulse Check  Posterior Tibialis and Dorsalis pulse intact bilaterally:  Yes  Comments  Grape sized freely mobile lipoma on right medial foot; toenail dystrophic changes; eczematous rash on heel b/l. No ulcerations.       Labs reviewed: Appointment on 05/22/2015  Component Date Value Ref Range Status  . Hgb A1c MFr Bld 05/22/2015 9.3* 4.8 - 5.6 % Final   Comment:          Pre-diabetes: 5.7 - 6.4          Diabetes: >6.4          Glycemic control for adults with diabetes: <7.0   . Est. average glucose Bld gHb Est-m* 05/22/2015 220   Final  . Glucose 05/22/2015 231* 65 - 99 mg/dL Final  . BUN 05/22/2015 10  8 - 27 mg/dL Final  . Creatinine, Ser 05/22/2015 0.79  0.57 - 1.00 mg/dL Final  . GFR calc non Af Amer 05/22/2015 72  >59 mL/min/1.73 Final  . GFR calc Af Amer 05/22/2015 83  >59 mL/min/1.73 Final  . BUN/Creatinine Ratio 05/22/2015 13  12 - 28 Final  . Sodium 05/22/2015 139  134 - 144 mmol/L Final  . Potassium 05/22/2015 4.3  3.5 - 5.2 mmol/L Final  . Chloride 05/22/2015 96  96 - 106 mmol/L Final  . CO2 05/22/2015 27  18 - 29 mmol/L Final  . Calcium 05/22/2015 9.7  8.7 - 10.3 mg/dL Final  . Total Protein 05/22/2015 7.0  6.0 - 8.5 g/dL Final  . Albumin 05/22/2015 4.0  3.5 - 4.8 g/dL Final  . Globulin, Total 05/22/2015 3.0  1.5 - 4.5 g/dL Final  . Albumin/Globulin  Ratio 05/22/2015 1.3  1.2 - 2.2 Final  . Bilirubin Total 05/22/2015 0.5  0.0 - 1.2 mg/dL Final  . Alkaline Phosphatase 05/22/2015 83  39 - 117 IU/L Final  . AST 05/22/2015 11  0 - 40 IU/L Final  . ALT 05/22/2015 10  0 - 32 IU/L Final    No results found.   Assessment/Plan   ICD-9-CM ICD-10-CM   1. Eczematous dermatitis 692.9 L30.9 clotrimazole-betamethasone (LOTRISONE) cream  2. Uncontrolled type 2 diabetes mellitus with diabetic nephropathy, with long-term current use of insulin (HCC) 250.42 E11.21 glipiZIDE (GLUCOTROL XL) 10 MG 24 hr tablet   583.81 E11.65    V58.67 Z79.4   3. Hyperlipidemia LDL goal <100 272.4 E78.5   4. Essential hypertension 401.9 I10   5. Hypothyroidism, unspecified hypothyroidism type 244.9 E03.9   6. Gastroesophageal reflux disease, esophagitis presence not specified 530.81 K21.9   7. Diabetic polyneuropathy associated with type 2 diabetes mellitus (HCC) 250.60 E11.42    357.2    8. Lipoma of left lower extremity 214.1 D17.24    grape sized on foot; benign   Reassurance given for lipoma. She is asymptomatic   Will add lipid panel and TSH to recent lab draw  STOP insulin due to side effects. lantus and levemir added to allergy list  START glipizide ER daily  Continue other medications as ordered  Reduce complex carbohydrates  Follow up in 3 mos for routine visit. Check fasting labs prior to appt (BMP, ALT, lipid panel, a1c, TSH)  [facility] [address] [place], [place] [address] [phone] Cell (Monday-Friday 8 AM - 5 PM) [phone] After 5 PM and follow prompts  

## 2015-05-24 NOTE — Patient Instructions (Addendum)
STOP insulin due to side effects  START glipizide ER daily  Continue other medications as ordered  Reduce complex carbohydrates  Follow up in 3 mos for routine visit. Check fasting labs prior to appt

## 2015-05-25 LAB — LIPID PANEL W/O CHOL/HDL RATIO
Cholesterol, Total: 273 mg/dL — ABNORMAL HIGH (ref 100–199)
HDL: 54 mg/dL (ref >39)
LDL Calculated: 198 mg/dL — ABNORMAL HIGH (ref 0–99)
Triglycerides: 103 mg/dL (ref 0–149)
VLDL Cholesterol Cal: 21 mg/dL (ref 5–40)

## 2015-05-25 LAB — SPECIMEN STATUS REPORT

## 2015-05-25 LAB — TSH: TSH: 3.47 u[IU]/mL (ref 0.450–4.500)

## 2015-05-30 ENCOUNTER — Telehealth: Payer: Self-pay | Admitting: *Deleted

## 2015-05-30 NOTE — Telephone Encounter (Signed)
Patient called and stated that the Lotrisone Cream you gave her for her feet cost too much $61.19 and she needs something cheaper. Please advise.

## 2015-05-30 NOTE — Telephone Encounter (Signed)
Called pharmacist at [facility] and he stated that there is not alternative listed just a phone number to call: #[phone] Member ID: [id number] Called and spoke with pharmacist with the insurance company and she stated they will cover Clotirimazole or Cetamethasone bipriopionate Cream

## 2015-05-30 NOTE — Telephone Encounter (Signed)
What will insurance cover that is similar?

## 2015-05-30 NOTE — Telephone Encounter (Signed)
Ok. Rx nystatin cream to rash BID #30gm with 1rf  AND betamethasone cream 0.05% #30gm to rash BID with 1RF

## 2015-05-31 MED ORDER — NYSTATIN 100000 UNIT/GM EX CREA: 1.0000 "application " | TOPICAL_CREAM | Freq: Two times a day (BID) | CUTANEOUS | Status: DC

## 2015-05-31 MED ORDER — BETAMETHASONE DIPROPIONATE 0.05 % EX CREA: TOPICAL_CREAM | Freq: Two times a day (BID) | CUTANEOUS | Status: DC

## 2015-05-31 NOTE — Telephone Encounter (Deleted)
Patient Notified and Rx's fa

## 2015-05-31 NOTE — Telephone Encounter (Signed)
Deleted previous note due to [facility] logged me out in mid sentence.  Patient notified and Rx's faxed to pharmacy.

## 2015-06-17 ENCOUNTER — Encounter: Payer: Self-pay | Admitting: Internal Medicine

## 2015-06-17 ENCOUNTER — Ambulatory Visit: Payer: Medicare Other | Admitting: Pharmacotherapy

## 2015-06-19 ENCOUNTER — Encounter: Payer: Self-pay | Admitting: Podiatry

## 2015-06-19 ENCOUNTER — Ambulatory Visit (INDEPENDENT_AMBULATORY_CARE_PROVIDER_SITE_OTHER): Payer: Medicare Other | Admitting: Podiatry

## 2015-06-19 VITALS — BP 155/78 | HR 87

## 2015-06-19 DIAGNOSIS — M79606 Pain in leg, unspecified: Secondary | ICD-10-CM

## 2015-06-19 DIAGNOSIS — B351 Tinea unguium: Secondary | ICD-10-CM

## 2015-06-19 NOTE — Progress Notes (Signed)
Subjective: 81 year old female presents complaining of painful toe nails.  Stated that she is not on insulin now. Using Betamethasone .05% Cream and Mycostatin(Nystatin) Cream for her itch skin condition.   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 Thick dystrophic nails x 10 with yellow fungal debris under nail plate of both great toes.  Pedal pulses are not palpable.  No edema or erythema noted. No abnormal skin lesions noted.  Dry peeling skin plantar heels bilateral.   Assessment: Mycotic nails bilateral. Diabetic Neuropathy. 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 PVD. Tinea pedis bilateral.   Plan: Debrided all nails.  Return in 3 months or as needed.

## 2015-06-19 NOTE — Patient Instructions (Signed)
Seen for hypertrophic nails. All nails debrided. May use Vitamin A cream for dry skin.  Return in 3 months or as needed.

## 2015-07-11 ENCOUNTER — Other Ambulatory Visit: Payer: Self-pay | Admitting: Internal Medicine

## 2015-08-21 NOTE — Addendum Note (Signed)
Addended by: Logan Bores on: 08/21/2015 02:53 PM   Modules accepted: Orders

## 2015-08-26 ENCOUNTER — Other Ambulatory Visit: Payer: Medicare Other

## 2015-09-03 ENCOUNTER — Other Ambulatory Visit: Payer: Medicare Other

## 2015-09-03 DIAGNOSIS — E785 Hyperlipidemia, unspecified: Secondary | ICD-10-CM

## 2015-09-03 DIAGNOSIS — E1165 Type 2 diabetes mellitus with hyperglycemia: Principal | ICD-10-CM

## 2015-09-03 DIAGNOSIS — E039 Hypothyroidism, unspecified: Secondary | ICD-10-CM | POA: Diagnosis not present

## 2015-09-03 DIAGNOSIS — IMO0002 Reserved for concepts with insufficient information to code with codable children: Secondary | ICD-10-CM

## 2015-09-03 DIAGNOSIS — E1121 Type 2 diabetes mellitus with diabetic nephropathy: Secondary | ICD-10-CM | POA: Diagnosis not present

## 2015-09-03 DIAGNOSIS — Z794 Long term (current) use of insulin: Secondary | ICD-10-CM | POA: Diagnosis not present

## 2015-09-03 LAB — ALT: ALT: 14 U/L (ref 6–29)

## 2015-09-03 LAB — LIPID PANEL
CHOL/HDL RATIO: 5.9 ratio — AB (ref <=5.0)
CHOLESTEROL: 266 mg/dL — AB (ref 125–200)
HDL: 45 mg/dL — AB (ref >=46)
LDL Cholesterol: 178 mg/dL — ABNORMAL HIGH (ref <130)
TRIGLYCERIDES: 217 mg/dL — AB (ref <150)
VLDL: 43 mg/dL — AB (ref <30)

## 2015-09-03 LAB — BASIC METABOLIC PANEL
BUN: 12 mg/dL (ref 7–25)
CHLORIDE: 100 mmol/L (ref 98–110)
CO2: 28 mmol/L (ref 20–31)
Calcium: 9.5 mg/dL (ref 8.6–10.4)
Creat: 0.8 mg/dL (ref 0.60–0.93)
Glucose, Bld: 216 mg/dL — ABNORMAL HIGH (ref 65–99)
POTASSIUM: 4.1 mmol/L (ref 3.5–5.3)
SODIUM: 138 mmol/L (ref 135–146)

## 2015-09-04 ENCOUNTER — Encounter: Payer: Self-pay | Admitting: Internal Medicine

## 2015-09-04 ENCOUNTER — Ambulatory Visit (INDEPENDENT_AMBULATORY_CARE_PROVIDER_SITE_OTHER): Payer: Medicare Other | Admitting: Internal Medicine

## 2015-09-04 VITALS — BP 138/84 | HR 98 | Temp 97.5°F | Ht 62.0 in | Wt 148.2 lb

## 2015-09-04 DIAGNOSIS — F411 Generalized anxiety disorder: Secondary | ICD-10-CM

## 2015-09-04 DIAGNOSIS — E1165 Type 2 diabetes mellitus with hyperglycemia: Secondary | ICD-10-CM

## 2015-09-04 DIAGNOSIS — Z9114 Patient's other noncompliance with medication regimen: Secondary | ICD-10-CM

## 2015-09-04 DIAGNOSIS — E1142 Type 2 diabetes mellitus with diabetic polyneuropathy: Secondary | ICD-10-CM

## 2015-09-04 DIAGNOSIS — K219 Gastro-esophageal reflux disease without esophagitis: Secondary | ICD-10-CM | POA: Diagnosis not present

## 2015-09-04 DIAGNOSIS — E034 Atrophy of thyroid (acquired): Secondary | ICD-10-CM | POA: Diagnosis not present

## 2015-09-04 DIAGNOSIS — E038 Other specified hypothyroidism: Secondary | ICD-10-CM

## 2015-09-04 DIAGNOSIS — I10 Essential (primary) hypertension: Secondary | ICD-10-CM | POA: Diagnosis not present

## 2015-09-04 DIAGNOSIS — E1121 Type 2 diabetes mellitus with diabetic nephropathy: Secondary | ICD-10-CM | POA: Diagnosis not present

## 2015-09-04 DIAGNOSIS — E785 Hyperlipidemia, unspecified: Secondary | ICD-10-CM | POA: Diagnosis not present

## 2015-09-04 DIAGNOSIS — IMO0002 Reserved for concepts with insufficient information to code with codable children: Secondary | ICD-10-CM

## 2015-09-04 LAB — TSH: TSH: 0.35 mIU/L — ABNORMAL LOW

## 2015-09-04 LAB — HEMOGLOBIN A1C
Hgb A1c MFr Bld: 10.5 % — ABNORMAL HIGH (ref <5.7)
Mean Plasma Glucose: 255 mg/dL

## 2015-09-04 MED ORDER — CITALOPRAM HYDROBROMIDE 10 MG PO TABS: 5.0000 mg | ORAL_TABLET | Freq: Every day | ORAL | 3 refills | Status: DC

## 2015-09-04 MED ORDER — AMLODIPINE BESYLATE 10 MG PO TABS: 10.0000 mg | ORAL_TABLET | Freq: Every day | ORAL | 3 refills | Status: DC

## 2015-09-04 MED ORDER — LEVOTHYROXINE SODIUM 150 MCG PO TABS: 150.0000 ug | ORAL_TABLET | Freq: Every morning | ORAL | 3 refills | Status: DC

## 2015-09-04 MED ORDER — PIOGLITAZONE HCL 15 MG PO TABS: 15.0000 mg | ORAL_TABLET | Freq: Every day | ORAL | 3 refills | Status: DC

## 2015-09-04 MED ORDER — LOSARTAN POTASSIUM 100 MG PO TABS: 100.0000 mg | ORAL_TABLET | Freq: Every day | ORAL | 3 refills | Status: DC

## 2015-09-04 NOTE — Progress Notes (Signed)
Patient ID: Allison [name], female   DOB: January 18, 1934, 81 y.o.   MRN: [id number]    Location:  [facility] Place of Service: OFFICE  Chief Complaint  Patient presents with  . Medical Management of Chronic Issues    3 month follow up  . Medication Management    patient stated that a medication is making lips swell    HPI:  81 yo female seen today for f/u. She reports increased stressors at home. She does not sleep well at night as she has been sleeping in the den on a love seat because there is a TV in there. She is eating out 3 meals per day.  HTN - BP stable on lotrel and chlorthalidone. BP 147/90 yesterday at home.  Hyperlipidemia - LDL 178 (not at goal)/Tchol 266/HDL 45. She stopped taking her statin as it caused interruption of sleep. No myalgias  hypothyroid - she takes levothyroxine.TSH 0.35. No constipation or diarrhea but does have increased anxiety/depression, palpitations. She reports weight loss 4 lbs since last OV  DM - uncontrolled with A1c 10.5%. BS fluctuating high 200 - upper 300s. She takes glipizide ER and metformin. Numbness in fingers. Takes gabapentin. She saw eye specialist in Feb 2017. No diabetic changes seen. 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 Uses magnifying glass to read at times. She reported angioedema with lantus and levemir  GERD - reflux sx's stable on omeprazole   Past Medical History:  Diagnosis Date  . Carpal tunnel syndrome   . Cough   . Diabetes mellitus without complication ([place])   . Dysphagia   . GERD (gastroesophageal reflux disease)   . Hiatal hernia   . HLD (hyperlipidemia)   . Hx of adenomatous colonic polyps 06/19/2014  . Hypertension   . Hypothyroidism   . Onychomycosis 03/05/2012  . Pain in lower limb 04/18/2013  . Sciatic nerve pain   . SOB (shortness of breath)   . Vertigo     Past Surgical History:  Procedure Laterality Date  . BLADDER SURGERY     Mesh implant  . COLONOSCOPY    . LARYNX SURGERY     vocal cords  . PARTIAL HYSTERECTOMY   1978  . VESICOVAGINAL FISTULA CLOSURE W/ TAH  1976    Patient Care Team: Gildardo Cranker, DO as PCP - General (Internal Medicine)  Social History   Social History  . Marital status: Single    Spouse name: N/A  . Number of children: 2  . Years of education: N/A   Occupational History  . Retired    Social History Main Topics  . Smoking status: Never Smoker  . Smokeless tobacco: Never Used  . Alcohol use No  . Drug use: No  . Sexual activity: No   Other Topics Concern  . Not on file   Social History Narrative   Single   2 sons   Retired   2 cups caffeine daily   05/01/2014        reports that she has never smoked. She has never used smokeless tobacco. She reports that she does not drink alcohol or use drugs.  Family History  Problem Relation Age of Onset  . Alzheimer's disease Mother   . Glaucoma Mother   . Heart disease Father   . Alzheimer's disease Father   . Asthma Sister   . Allergies Sister   . Allergies Sister   . Sleep apnea Sister   . Heart disease Sister   . Breast cancer Maternal Aunt   .  Pancreatic cancer Maternal Aunt   . Prostate cancer Maternal Uncle   . Prostate cancer Paternal Uncle   . Alcoholism Maternal Uncle   . Kidney disease Maternal Aunt   . Heart disease Maternal Aunt    Family Status  Relation Status  . Mother Deceased  . Father Deceased  . Sister Alive  . Sister Alive  . Son Alive  . Son Alive  . Sister   . Sister   . Maternal Aunt   . Maternal Aunt   . Maternal Uncle   . Paternal Uncle   . Maternal Uncle   . Maternal Aunt   . Maternal Aunt      Allergies  Allergen Reactions  . Pineapple Anaphylaxis  . Codeine Other (See Comments)    unknown  . Invokana [Canagliflozin] Other (See Comments)    Constipation, nightmares  . Lantus [Insulin Glargine] Itching and Swelling    angioedema  . Levemir [Insulin Detemir] Itching and Swelling    angioedema  . Penicillins Other (See Comments)    Unknown   . Tradjenta  [Linagliptin] Other (See Comments)  . Vioxx [Rofecoxib] Other (See Comments)    unknown    Medications: Patient's Medications  New Prescriptions   No medications on file  Previous Medications   ALBUTEROL (PROVENTIL HFA;VENTOLIN HFA) 108 (90 BASE) MCG/ACT INHALER    Inhale 2 puffs into the lungs every 4 (four) hours as needed for wheezing or shortness of breath.   AMBULATORY NON FORMULARY MEDICATION    Apply to your wrist daily   AMLODIPINE-BENAZEPRIL (LOTREL) 10-40 MG CAPSULE    Take one capsule by mouth once daily for blood pressure   ASPIRIN EC 81 MG TABLET    Take 1 tablet (81 mg total) by mouth daily.   ATORVASTATIN (LIPITOR) 40 MG TABLET    take 1 tablet by mouth at bedtime   BETAMETHASONE DIPROPIONATE (DIPROLENE) 0.05 % CREAM    Apply topically 2 (two) times daily.   CHLORTHALIDONE (HYGROTON) 25 MG TABLET    take 1 tablet by mouth once daily   GABAPENTIN (NEURONTIN) 100 MG CAPSULE    Take 2 capsules (200 mg total) by mouth 3 (three) times daily. For neuropathy   GLIPIZIDE (GLUCOTROL XL) 10 MG 24 HR TABLET    Take 1 tablet (10 mg total) by mouth daily with breakfast.   GLUCOSE BLOOD TEST STRIP    Use to test blood sugar three times daily.  Dx: E11.21   LANCETS (ONETOUCH ULTRASOFT) LANCETS    Use to test blood sugar three times daily. Dx: E11.21   LEVOTHYROXINE (SYNTHROID, LEVOTHROID) 175 MCG TABLET    Take 1 tablet (175 mcg total) by mouth every morning.   METFORMIN (GLUCOPHAGE) 500 MG TABLET    Take 2 tablets (1,000 mg total) by mouth 2 (two) times daily with a meal.   NYSTATIN CREAM (MYCOSTATIN)    Apply 1 application topically 2 (two) times daily.   OMEPRAZOLE (PRILOSEC) 20 MG CAPSULE    take 1 capsule by mouth once daily   VITAMIN E (VITAMIN E) 1000 UNIT CAPSULE    Take 1,000 Units by mouth daily.  Modified Medications   No medications on file  Discontinued Medications   No medications on file    Review of Systems  Constitutional: Positive for unexpected weight change.    HENT: Positive for drooling.   Cardiovascular: Positive for palpitations.  Musculoskeletal: Positive for arthralgias and joint swelling.  Skin: Positive for rash.  itchy dry scalp  Psychiatric/Behavioral: Positive for dysphoric mood and sleep disturbance. The patient is nervous/anxious.   All other systems reviewed and are negative.   Vitals:   09/04/15 0927  BP: 138/84  Pulse: 98  Temp: 97.5 F (36.4 C)  TempSrc: Oral  SpO2: 98%  Weight: 148 lb 3.2 oz (67.2 kg)  Height: 5\' 2"  (1.575 m)   Body mass index is 27.11 kg/m.  Physical Exam  Constitutional: She is oriented to person, place, and time. She appears well-developed and well-nourished. No distress.  HENT:  Mouth/Throat: Oropharynx is clear and moist. No oropharyngeal exudate.  Eyes: Pupils are equal, round, and reactive to light. No scleral icterus.  Neck: Neck supple. Carotid bruit is not present. No tracheal deviation present. No thyromegaly present.  Cardiovascular: Regular rhythm, normal heart sounds and intact distal pulses.  Tachycardia present.  Exam reveals no gallop and no friction rub.   No murmur heard. Trace LE edema b/l. no calf TTP.   Pulmonary/Chest: Effort normal and breath sounds normal. No stridor. No respiratory distress. She has no wheezes. She has no rales.  Abdominal: Soft. Bowel sounds are normal. She exhibits no distension and no mass. There is no hepatomegaly. There is no tenderness. There is no rebound and no guarding.  Musculoskeletal: She exhibits edema.  Lymphadenopathy:    She has no cervical adenopathy.  Neurological: She is alert and oriented to person, place, and time.  Skin: Skin is warm and dry. Rash (R>L lateral heel patchy papulosquamous rash. no vesicular formation) noted.  Left lateral malleolus lipoma egg sized, NT  Psychiatric: She has a normal mood and affect. Her behavior is normal. Thought content normal.   Diabetic Foot Exam - Simple   Simple Foot Form Diabetic Foot  exam was performed with the following findings:  Yes 09/04/2015 10:20 AM  Visual Inspection See comments:  Yes Sensation Testing Intact to touch and monofilament testing bilaterally:  Yes Pulse Check Posterior Tibialis and Dorsalis pulse intact bilaterally:  Yes Comments Thick dystrophic appearing toenails. No calluses or ulceration. Skin hyperpigmentation at heels      Labs reviewed: Appointment on 09/03/2015  Component Date Value Ref Range Status  . Sodium 09/03/2015 138  135 - 146 mmol/L Final  . Potassium 09/03/2015 4.1  3.5 - 5.3 mmol/L Final  . Chloride 09/03/2015 100  98 - 110 mmol/L Final  . CO2 09/03/2015 28  20 - 31 mmol/L Final  . Glucose, Bld 09/03/2015 216* 65 - 99 mg/dL Final  . BUN 09/03/2015 12  7 - 25 mg/dL Final  . Creat 09/03/2015 0.80  0.60 - 0.93 mg/dL Final   Comment:   For patients > or = 79 years of age: The upper reference limit for Creatinine is approximately 13% higher for people identified as African-American.     . Calcium 09/03/2015 9.5  8.6 - 10.4 mg/dL Final  . ALT 09/03/2015 14  6 - 29 U/L Final  . Cholesterol 09/03/2015 266* 125 - 200 mg/dL Final  . Triglycerides 09/03/2015 217* <150 mg/dL Final  . HDL 09/03/2015 45* >=46 mg/dL Final  . Total CHOL/HDL Ratio 09/03/2015 5.9* <=5.0 Ratio Final  . VLDL 09/03/2015 43* <30 mg/dL Final  . LDL Cholesterol 09/03/2015 178* <130 mg/dL Final   Comment:   Total Cholesterol/HDL Ratio:CHD Risk                        Coronary Heart Disease Risk Table  Men       Women          1/2 Average Risk              3.4        3.3              Average Risk              5.0        4.4           2X Average Risk              9.6        7.1           3X Average Risk             23.4       11.0 Use the calculated Patient Ratio above and the CHD Risk table  to determine the patient's CHD Risk.   . Hgb A1c MFr Bld 09/04/2015 10.5* <5.7 % Final   Comment:   For someone without  known diabetes, a hemoglobin A1c value of 6.5% or greater indicates that they may have diabetes and this should be confirmed with a follow-up test.   For someone with known diabetes, a value <7% indicates that their diabetes is well controlled and a value greater than or equal to 7% indicates suboptimal control. A1c targets should be individualized based on duration of diabetes, age, comorbid conditions, and other considerations.   Currently, no consensus exists for use of hemoglobin A1c for diagnosis of diabetes for children.     . Mean Plasma Glucose 09/04/2015 255  mg/dL Final  . TSH 09/04/2015 0.35* mIU/L Final   Comment:   Reference Range   > or = 20 Years  0.40-4.50   Pregnancy Range First trimester  0.26-2.66 Second trimester 0.55-2.73 Third trimester  0.43-2.91       No results found.   Assessment/Plan   ICD-9-CM ICD-10-CM   1. Anxiety state 300.00 F41.1 citalopram (CELEXA) 10 MG tablet  2. Uncontrolled type 2 diabetes mellitus with diabetic nephropathy, without long-term current use of insulin (HCC) 250.42 E11.21 pioglitazone (ACTOS) 15 MG tablet   583.81 E11.65 Ambulatory referral to Endocrinology  3. Essential hypertension 401.9 I10 amLODipine (NORVASC) 10 MG tablet     losartan (COZAAR) 100 MG tablet  4. Diabetic polyneuropathy associated with type 2 diabetes mellitus (HCC) 250.60 E11.42    357.2    5. Noncompliance with medication regimen V15.81 Z91.14   6. Hypothyroidism due to acquired atrophy of thyroid 244.8 E03.8 levothyroxine (SYNTHROID, LEVOTHROID) 150 MCG tablet   246.8 E03.4 TSH  7. Hyperlipidemia LDL goal <100 272.4 E78.5   8. Gastroesophageal reflux disease, esophagitis presence not specified 530.81 K21.9    Resume cholesterol medication. May take in the AM if unable to tolerate night dosing  Continue glipizide ER, metformin. START Actos daily for uncontrolled blood sugars  Follow up for lab in 4 weeks and will call with results  Will call  with referral to Endocrinology (diabetes specialist)  STOP LOTREL (amlodipine/benazepril). Start new blood pressure medication (losartan) daily and amlodipine daily  Take mood pill daily  Continue other medications as ordered  Reduce soda intake. Push water intake.  Follow up in 6 weeks for anxiety.    Jalexa Pifer S. Perlie Gold  [facility] [address] [place], [place] [address] [phone] Cell (Monday-Friday 8 AM - 5  PM) [phone] After 5 PM and follow prompts

## 2015-09-04 NOTE — Patient Instructions (Addendum)
Resume cholesterol medication. May take in the AM if unable to tolerate night dosing  Continue glipizide ER, metformin. START Actos daily for uncontrolled blood sugars  Follow up for lab in 4 weeks and will call with results  Will call with referral to Endocrinology (diabetes specialist)  STOP LOTREL (amlodipine/benazepril). Start new blood pressure medication (losartan) daily and amlodipine daily  Take mood pill daily  Continue other medications as ordered  Reduce soda intake. Push water intake.  Follow up in 6 weeks for anxiety.

## 2015-09-19 ENCOUNTER — Ambulatory Visit (INDEPENDENT_AMBULATORY_CARE_PROVIDER_SITE_OTHER): Payer: Medicare Other | Admitting: Podiatry

## 2015-09-19 ENCOUNTER — Encounter: Payer: Self-pay | Admitting: Podiatry

## 2015-09-19 VITALS — BP 160/79 | HR 86

## 2015-09-19 DIAGNOSIS — B351 Tinea unguium: Secondary | ICD-10-CM

## 2015-09-19 DIAGNOSIS — M79606 Pain in leg, unspecified: Secondary | ICD-10-CM

## 2015-09-19 NOTE — Progress Notes (Signed)
Subjective: 81 year old female presents complaining of painful toe nails.  Concerned with dark and itch skin on right heel at posterior surface.   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 Thick dystrophic nails x 10 with yellow fungal debris under nail plate of both great toes.  Pedal pulses are not palpable.  No edema or erythema noted. No abnormal skin lesions noted.  Dry peeling skin posterior right heel.  Assessment: Mycotic nails bilateral. Diabetic Neuropathy. 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 PVD. Tinea pedis right posterior heel.  Plan: Debrided all nails.  Advised to use antifungal cream for right itch heel. Stay off of closed in shoes till area clears off.  Return in 3 months or as needed.

## 2015-09-19 NOTE — Patient Instructions (Signed)
Seen for hypertrophic nails. Noted of mild fungal infection on right posterior heel. All nails debrided. May benefit from OTC antifungal cream. Return in 3 months or as needed.

## 2015-10-02 ENCOUNTER — Other Ambulatory Visit: Payer: Medicare Other

## 2015-10-04 ENCOUNTER — Other Ambulatory Visit: Payer: Self-pay | Admitting: *Deleted

## 2015-10-04 MED ORDER — ONETOUCH ULTRASOFT LANCETS MISC: 12 refills | Status: DC

## 2015-10-04 NOTE — Telephone Encounter (Signed)
Patient requested to be faxed to pharmacy 

## 2015-10-06 ENCOUNTER — Emergency Department (HOSPITAL_COMMUNITY)
Admission: EM | Admit: 2015-10-06 | Discharge: 2015-10-06 | Disposition: A | Discharge status: Home / Self Care | Payer: Medicare Other | Attending: Emergency Medicine | Admitting: Emergency Medicine

## 2015-10-06 ENCOUNTER — Encounter (HOSPITAL_COMMUNITY): Payer: Self-pay | Admitting: *Deleted

## 2015-10-06 DIAGNOSIS — I10 Essential (primary) hypertension: Secondary | ICD-10-CM | POA: Diagnosis not present

## 2015-10-06 DIAGNOSIS — E1140 Type 2 diabetes mellitus with diabetic neuropathy, unspecified: Secondary | ICD-10-CM | POA: Diagnosis not present

## 2015-10-06 DIAGNOSIS — IMO0001 Reserved for inherently not codable concepts without codable children: Secondary | ICD-10-CM

## 2015-10-06 DIAGNOSIS — E039 Hypothyroidism, unspecified: Secondary | ICD-10-CM | POA: Insufficient documentation

## 2015-10-06 DIAGNOSIS — Z79899 Other long term (current) drug therapy: Secondary | ICD-10-CM | POA: Insufficient documentation

## 2015-10-06 DIAGNOSIS — R42 Dizziness and giddiness: Secondary | ICD-10-CM | POA: Diagnosis present

## 2015-10-06 DIAGNOSIS — R739 Hyperglycemia, unspecified: Secondary | ICD-10-CM

## 2015-10-06 DIAGNOSIS — E1129 Type 2 diabetes mellitus with other diabetic kidney complication: Secondary | ICD-10-CM | POA: Insufficient documentation

## 2015-10-06 DIAGNOSIS — E1165 Type 2 diabetes mellitus with hyperglycemia: Secondary | ICD-10-CM | POA: Diagnosis not present

## 2015-10-06 DIAGNOSIS — Z7984 Long term (current) use of oral hypoglycemic drugs: Secondary | ICD-10-CM | POA: Insufficient documentation

## 2015-10-06 DIAGNOSIS — Z7982 Long term (current) use of aspirin: Secondary | ICD-10-CM | POA: Insufficient documentation

## 2015-10-06 DIAGNOSIS — R030 Elevated blood-pressure reading, without diagnosis of hypertension: Secondary | ICD-10-CM

## 2015-10-06 LAB — BASIC METABOLIC PANEL WITH GFR
Anion gap: 10 (ref 5–15)
BUN: <5 mg/dL — ABNORMAL LOW (ref 6–20)
CO2: 28 mmol/L (ref 22–32)
Calcium: 9.5 mg/dL (ref 8.9–10.3)
Chloride: 101 mmol/L (ref 101–111)
Creatinine, Ser: 0.74 mg/dL (ref 0.44–1.00)
GFR calc Af Amer: >60 mL/min
GFR calc non Af Amer: >60 mL/min
Glucose, Bld: 306 mg/dL — ABNORMAL HIGH (ref 65–99)
Potassium: 3.9 mmol/L (ref 3.5–5.1)
Sodium: 139 mmol/L (ref 135–145)

## 2015-10-06 LAB — CBC
HCT: 40 % (ref 36.0–46.0)
HEMOGLOBIN: 13.2 g/dL (ref 12.0–15.0)
MCH: 26.8 pg (ref 26.0–34.0)
MCHC: 33 g/dL (ref 30.0–36.0)
MCV: 81.3 fL (ref 78.0–100.0)
PLATELETS: 284 10*3/uL (ref 150–400)
RBC: 4.92 MIL/uL (ref 3.87–5.11)
RDW: 13.4 % (ref 11.5–15.5)
WBC: 7.3 10*3/uL (ref 4.0–10.5)

## 2015-10-06 LAB — CBG MONITORING, ED
GLUCOSE-CAPILLARY: 269 mg/dL — AB (ref 65–99)
Glucose-Capillary: 286 mg/dL — ABNORMAL HIGH (ref 65–99)

## 2015-10-06 NOTE — ED Notes (Signed)
Pt's CBG result: 269, informed PA.

## 2015-10-06 NOTE — ED Triage Notes (Signed)
Pt has multiple complaints. Reports unable to check her cbg in several days. Reports recent cold/URI, feeling "off balance" since then, occ dizziness. Reports feeling like her blood sugar and blood pressure are high.

## 2015-10-06 NOTE — ED Notes (Signed)
Pt refuses iv at this time. Sitting on bed. MAEW,. REsp even ans non  Labored.

## 2015-10-06 NOTE — ED Provider Notes (Signed)
[facility] DEPT Provider Note   CSN: [id number] Arrival date & time: 10/06/15  1119     History   Chief Complaint Chief Complaint  Patient presents with  . Dizziness    HPI Allison [name] is a 81 y.o. female who presents with feeling "off balance". PMH significant for DM Type 2 on oral meds, HTN, HLD, hypothyroidism, vertigo. She states that she has been out of her diabetic lancets for the past 4 days and has been unable to test her BG. This morning she felt off balance and was upset she couldn't test her sugar. She is also worried about her blood pressure being high but hasn't taken any of her meds today. She has been having night sweats and chills for a couple of weeks. Currently she denies any symptoms and is asking to go home. Denies fever, chills, HA, syncope, near syncope, unilateral weakness, acute vision change, chest pain, SOB, cough, abdominal pain, N/V, dysuria.  HPI  Past Medical History:  Diagnosis Date  . Carpal tunnel syndrome   . Diabetes mellitus without complication ([place])   . Dysphagia   . GERD (gastroesophageal reflux disease)   . Hiatal hernia   . HLD (hyperlipidemia)   . Hx of adenomatous colonic polyps 06/19/2014  . Hypertension   . Hypothyroidism   . Onychomycosis 03/05/2012  . Pain in lower limb 04/18/2013  . Sciatic nerve pain   . Vertigo     Patient Active Problem List   Diagnosis Date Noted  . Hx of adenomatous colonic polyps 06/19/2014  . Neuropathic pain 03/27/2014  . Uncontrolled type 2 diabetes with neuropathy ([place]) 03/27/2014  . Bilateral carpal tunnel syndrome 03/27/2014  . AKI (acute kidney injury) ([place]) 08/05/2013  . DM type 2, uncontrolled, with renal complications ([place]) 0000000  . Hypercalcemia 08/05/2013  . PVD (peripheral vascular disease) ([place]) 03/05/2012  . Onychomycosis 03/05/2012  . Other hammer toe (acquired) 03/05/2012  . Abnormal CXR 12/25/2011  . Hypothyroidism 06/23/2006  . DYSLIPIDEMIA 06/23/2006  . Essential  hypertension 06/23/2006  . GERD 06/23/2006  . IBS 06/23/2006    Past Surgical History:  Procedure Laterality Date  . BLADDER SURGERY     Mesh implant  . COLONOSCOPY    . LARYNX SURGERY     vocal cords  . PARTIAL HYSTERECTOMY  1978  . VESICOVAGINAL FISTULA CLOSURE W/ TAH  1976    OB History    No data available       Home Medications    Prior to Admission medications   Medication Sig Start Date End Date Taking? Authorizing Provider  AMBULATORY NON FORMULARY MEDICATION Apply to your wrist daily 03/27/14  Yes Mahima Bubba Camp, MD  amLODipine (NORVASC) 10 MG tablet Take 1 tablet (10 mg total) by mouth daily. For blood pressure 09/04/15  Yes Gildardo Cranker, DO  aspirin EC 81 MG tablet Take 1 tablet (81 mg total) by mouth daily. 03/27/14  Yes Mahima Bubba Camp, MD  atorvastatin (LIPITOR) 40 MG tablet take 1 tablet by mouth at bedtime 07/11/15  Yes Gildardo Cranker, DO  betamethasone dipropionate (DIPROLENE) 0.05 % cream Apply topically 2 (two) times daily. 05/31/15  Yes Gildardo Cranker, DO  chlorthalidone (HYGROTON) 25 MG tablet take 1 tablet by mouth once daily 12/17/14  Yes Gildardo Cranker, DO  gabapentin (NEURONTIN) 100 MG capsule Take 2 capsules (200 mg total) by mouth 3 (three) times daily. For neuropathy 12/12/14  Yes Gildardo Cranker, DO  glipiZIDE (GLUCOTROL XL) 10 MG 24 hr tablet Take 1 tablet (10  mg total) by mouth daily with breakfast. 05/24/15  Yes Gildardo Cranker, DO  levothyroxine (SYNTHROID, LEVOTHROID) 150 MCG tablet Take 1 tablet (150 mcg total) by mouth every morning. For thyroid 09/04/15  Yes Gildardo Cranker, DO  losartan (COZAAR) 100 MG tablet Take 1 tablet (100 mg total) by mouth daily. For blood pressure 09/04/15  Yes Gildardo Cranker, DO  metFORMIN (GLUCOPHAGE) 500 MG tablet Take 2 tablets (1,000 mg total) by mouth 2 (two) times daily with a meal. 02/14/15  Yes Gildardo Cranker, DO  nystatin cream (MYCOSTATIN) Apply 1 application topically 2 (two) times daily. 05/31/15  Yes Gildardo Cranker, DO    omeprazole (PRILOSEC) 20 MG capsule take 1 capsule by mouth once daily 07/11/15  Yes Gildardo Cranker, DO  vitamin E (VITAMIN E) 1000 UNIT capsule Take 1,000 Units by mouth daily.   Yes Historical Provider, MD  albuterol (PROVENTIL HFA;VENTOLIN HFA) 108 (90 BASE) MCG/ACT inhaler Inhale 2 puffs into the lungs every 4 (four) hours as needed for wheezing or shortness of breath. 05/11/14   Gildardo Cranker, DO  citalopram (CELEXA) 10 MG tablet Take 0.5 tablets (5 mg total) by mouth daily. For mood Patient not taking: Reported on 10/06/2015 09/04/15   Gildardo Cranker, DO  glucose blood test strip Use to test blood sugar three times daily.  Dx: E11.21 Patient not taking: Reported on 10/06/2015 01/11/15   Gildardo Cranker, DO  Lancets [facility] ULTRASOFT) lancets Use to test blood sugar three times daily. Dx: E11.21 Patient not taking: Reported on 10/06/2015 10/04/15   Gildardo Cranker, DO  pioglitazone (ACTOS) 15 MG tablet Take 1 tablet (15 mg total) by mouth daily. For diabetes Patient not taking: Reported on 10/06/2015 09/04/15   Gildardo Cranker, DO    Family History Family History  Problem Relation Age of Onset  . Alzheimer's disease Mother   . Glaucoma Mother   . Heart disease Father   . Alzheimer's disease Father   . Asthma Sister   . Allergies Sister   . Allergies Sister   . Sleep apnea Sister   . Heart disease Sister   . Breast cancer Maternal Aunt   . Pancreatic cancer Maternal Aunt   . Prostate cancer Maternal Uncle   . Prostate cancer Paternal Uncle   . Alcoholism Maternal Uncle   . Kidney disease Maternal Aunt   . Heart disease Maternal Aunt     Social History Social History  Substance Use Topics  . Smoking status: Never Smoker  . Smokeless tobacco: Never Used  . Alcohol use No     Allergies   Pineapple; Codeine; Invokana [canagliflozin]; Lantus [insulin glargine]; Levemir [insulin detemir]; Penicillins; Tradjenta [linagliptin]; and Vioxx [rofecoxib]   Review of Systems Review of  Systems  Constitutional: Negative for chills and fever.  Respiratory: Negative for shortness of breath.   Cardiovascular: Negative for chest pain.  Gastrointestinal: Negative for abdominal pain, nausea and vomiting.  Genitourinary: Negative for dysuria.  Neurological: Negative for dizziness, syncope, weakness, light-headedness, numbness and headaches.       "Off balance"     Physical Exam Updated Vital Signs BP 171/94 (BP Location: Left Arm)   Pulse 68   Temp 97.9 F (36.6 C) (Oral)   Resp 18   SpO2 98%   Physical Exam  Constitutional: She is oriented to person, place, and time. She appears well-developed and well-nourished. No distress.  HENT:  Head: Normocephalic and atraumatic.  Eyes: Conjunctivae are normal. Pupils are equal, round, and reactive to light. Right eye exhibits no  discharge. Left eye exhibits no discharge. No scleral icterus.  Neck: Normal range of motion. Neck supple.  Cardiovascular: Normal rate and regular rhythm.  Exam reveals no gallop and no friction rub.   No murmur heard. Pulmonary/Chest: Effort normal and breath sounds normal. No respiratory distress. She has no wheezes. She has no rales. She exhibits no tenderness.  Abdominal: Soft. She exhibits no distension.  Musculoskeletal: She exhibits no edema.  Neurological: She is alert and oriented to person, place, and time.  Mental Status:  Alert, oriented, thought content appropriate, able to give a coherent history. Speech fluent without evidence of aphasia. Able to follow 2 step commands without difficulty.  Cranial Nerves:  II:  Peripheral visual fields grossly normal, pupils equal, round, reactive to light III,IV, VI: ptosis not present, extra-ocular motions intact bilaterally  V,VII: smile symmetric, facial light touch sensation equal VIII: hearing grossly normal to voice  X: uvula elevates symmetrically  XI: bilateral shoulder shrug symmetric and strong XII: midline tongue extension without  fassiculations Motor:  Normal tone. 5/5 in upper and lower extremities bilaterally including strong and equal grip strength and dorsiflexion/plantar flexion Sensory: Pinprick and light touch normal in all extremities.  Deep Tendon Reflexes: 2+ and symmetric in the biceps and patella Cerebellar: normal finger-to-nose with bilateral upper extremities Gait: normal gait and balance CV: distal pulses palpable throughout    Skin: Skin is warm and dry.  Psychiatric: She has a normal mood and affect. Her behavior is normal.  Nursing note and vitals reviewed.    ED Treatments / Results  Labs (all labs ordered are listed, but only abnormal results are displayed) Labs Reviewed  BASIC METABOLIC PANEL - Abnormal; Notable for the following:       Result Value   Glucose, Bld 306 (*)    BUN <5 (*)    All other components within normal limits  CBG MONITORING, ED - Abnormal; Notable for the following:    Glucose-Capillary 286 (*)    All other components within normal limits  CBG MONITORING, ED - Abnormal; Notable for the following:    Glucose-Capillary 269 (*)    All other components within normal limits  CBC  URINALYSIS, ROUTINE W REFLEX MICROSCOPIC (NOT AT [facility])    EKG  EKG Interpretation None       Radiology No results found.  Procedures Procedures (including critical care time)  Medications Ordered in ED Medications - No data to display   Initial Impression / Assessment and Plan / ED Course  I have reviewed the triage vital signs and the nursing notes.  Pertinent labs & imaging results that were available during my care of the patient were reviewed by me and considered in my medical decision making (see chart for details).  Clinical Course   81 year old female presents with intermittent feeling off balance. Her BP is noted to be elevated today. She has not taken her meds and endorses that her BP is usually high. All other vitals are WNL and stable. She is asymptomatic  presently in the ED. Neuro exam is without ataxia and she has a normal finger to nose. Additionally her glucose is elevated as well. CBG is 286. A1c on 8/22 was 10.5. She is currently on Metformin and Glipizide and is unable to tolerate insulin. CBC is unremarkable. BMP is unremarkable other than glucose. EKG is NSR. Shared visit with Dr. Leonette Monarch. Will have her follow up with PCP. Patient is NAD, non-toxic, with stable VS. Patient is informed of  clinical course, understands medical decision making process, and agrees with plan. Opportunity for questions provided and all questions answered. Return precautions given.   Final Clinical Impressions(s) / ED Diagnoses   Final diagnoses:  Hyperglycemia  Elevated blood pressure    New Prescriptions New Prescriptions   No medications on file     Recardo Evangelist, PA-C 10/06/15 [place], MD 10/11/15 [phone]

## 2015-10-08 ENCOUNTER — Other Ambulatory Visit: Payer: Self-pay | Admitting: *Deleted

## 2015-10-08 MED ORDER — ONETOUCH DELICA LANCETS FINE MISC: 3 refills | Status: DC

## 2015-10-08 NOTE — Telephone Encounter (Signed)
Patient requested to be faxed to pharmacy. Will call back to schedule an appointment.

## 2015-10-16 ENCOUNTER — Ambulatory Visit: Payer: Medicare Other | Admitting: Internal Medicine

## 2015-10-22 DIAGNOSIS — H40033 Anatomical narrow angle, bilateral: Secondary | ICD-10-CM | POA: Diagnosis not present

## 2015-10-22 DIAGNOSIS — E119 Type 2 diabetes mellitus without complications: Secondary | ICD-10-CM | POA: Diagnosis not present

## 2015-10-26 ENCOUNTER — Encounter (HOSPITAL_BASED_OUTPATIENT_CLINIC_OR_DEPARTMENT_OTHER): Payer: Self-pay | Admitting: *Deleted

## 2015-10-26 ENCOUNTER — Emergency Department (HOSPITAL_BASED_OUTPATIENT_CLINIC_OR_DEPARTMENT_OTHER)
Admission: EM | Admit: 2015-10-26 | Discharge: 2015-10-26 | Disposition: A | Discharge status: Home / Self Care | Payer: Medicare Other | Source: Home / Self Care | Attending: Emergency Medicine | Admitting: Emergency Medicine

## 2015-10-26 DIAGNOSIS — R1310 Dysphagia, unspecified: Secondary | ICD-10-CM | POA: Diagnosis not present

## 2015-10-26 DIAGNOSIS — I638 Other cerebral infarction: Secondary | ICD-10-CM | POA: Diagnosis not present

## 2015-10-26 DIAGNOSIS — R51 Headache: Secondary | ICD-10-CM | POA: Diagnosis not present

## 2015-10-26 DIAGNOSIS — I10 Essential (primary) hypertension: Secondary | ICD-10-CM | POA: Insufficient documentation

## 2015-10-26 DIAGNOSIS — R443 Hallucinations, unspecified: Secondary | ICD-10-CM | POA: Diagnosis not present

## 2015-10-26 DIAGNOSIS — E039 Hypothyroidism, unspecified: Secondary | ICD-10-CM

## 2015-10-26 DIAGNOSIS — E1129 Type 2 diabetes mellitus with other diabetic kidney complication: Secondary | ICD-10-CM | POA: Diagnosis not present

## 2015-10-26 DIAGNOSIS — Z7982 Long term (current) use of aspirin: Secondary | ICD-10-CM | POA: Insufficient documentation

## 2015-10-26 DIAGNOSIS — E1165 Type 2 diabetes mellitus with hyperglycemia: Secondary | ICD-10-CM

## 2015-10-26 DIAGNOSIS — R739 Hyperglycemia, unspecified: Secondary | ICD-10-CM

## 2015-10-26 DIAGNOSIS — Z7984 Long term (current) use of oral hypoglycemic drugs: Secondary | ICD-10-CM

## 2015-10-26 DIAGNOSIS — I6501 Occlusion and stenosis of right vertebral artery: Secondary | ICD-10-CM | POA: Diagnosis not present

## 2015-10-26 DIAGNOSIS — E1121 Type 2 diabetes mellitus with diabetic nephropathy: Secondary | ICD-10-CM | POA: Diagnosis not present

## 2015-10-26 DIAGNOSIS — Z79899 Other long term (current) drug therapy: Secondary | ICD-10-CM

## 2015-10-26 DIAGNOSIS — R260 Ataxic gait: Secondary | ICD-10-CM | POA: Diagnosis not present

## 2015-10-26 LAB — CBC WITH DIFFERENTIAL/PLATELET
BASOS PCT: 1 %
Basophils Absolute: 0 10*3/uL (ref 0.0–0.1)
EOS ABS: 0.2 10*3/uL (ref 0.0–0.7)
Eosinophils Relative: 3 %
HCT: 40.8 % (ref 36.0–46.0)
HEMOGLOBIN: 13.9 g/dL (ref 12.0–15.0)
Lymphocytes Relative: 30 %
Lymphs Abs: 2.3 10*3/uL (ref 0.7–4.0)
MCH: 27.3 pg (ref 26.0–34.0)
MCHC: 34.1 g/dL (ref 30.0–36.0)
MCV: 80.2 fL (ref 78.0–100.0)
Monocytes Absolute: 0.9 10*3/uL (ref 0.1–1.0)
Monocytes Relative: 12 %
NEUTROS PCT: 54 %
Neutro Abs: 4.2 10*3/uL (ref 1.7–7.7)
Platelets: 248 10*3/uL (ref 150–400)
RBC: 5.09 MIL/uL (ref 3.87–5.11)
RDW: 13.8 % (ref 11.5–15.5)
WBC: 7.7 10*3/uL (ref 4.0–10.5)

## 2015-10-26 LAB — URINALYSIS, ROUTINE W REFLEX MICROSCOPIC
BILIRUBIN URINE: NEGATIVE
Glucose, UA: >1000 mg/dL — AB
Hgb urine dipstick: NEGATIVE
KETONES UR: NEGATIVE mg/dL
Leukocytes, UA: NEGATIVE
NITRITE: NEGATIVE
PROTEIN: NEGATIVE mg/dL
SPECIFIC GRAVITY, URINE: 1.028 (ref 1.005–1.030)
pH: 5.5 (ref 5.0–8.0)

## 2015-10-26 LAB — I-STAT VENOUS BLOOD GAS, ED
ACID-BASE EXCESS: 8 mmol/L — AB (ref 0.0–2.0)
Bicarbonate: 34.9 mmol/L — ABNORMAL HIGH (ref 20.0–28.0)
O2 SAT: 28 %
PCO2 VEN: 58 mmHg (ref 44.0–60.0)
PH VEN: 7.386 (ref 7.250–7.430)
TCO2: 37 mmol/L (ref 0–100)
pO2, Ven: 19 mmHg — CL (ref 32.0–45.0)

## 2015-10-26 LAB — URINE MICROSCOPIC-ADD ON

## 2015-10-26 LAB — BASIC METABOLIC PANEL
Anion gap: 9 (ref 5–15)
BUN: 12 mg/dL (ref 6–20)
CHLORIDE: 94 mmol/L — AB (ref 101–111)
CO2: 30 mmol/L (ref 22–32)
CREATININE: 1.16 mg/dL — AB (ref 0.44–1.00)
Calcium: 9.8 mg/dL (ref 8.9–10.3)
GFR calc non Af Amer: 44 mL/min — ABNORMAL LOW (ref >60)
GFR, EST AFRICAN AMERICAN: 51 mL/min — AB (ref >60)
Glucose, Bld: 600 mg/dL (ref 65–99)
Potassium: 4.1 mmol/L (ref 3.5–5.1)
SODIUM: 133 mmol/L — AB (ref 135–145)

## 2015-10-26 LAB — CBG MONITORING, ED: Glucose-Capillary: 459 mg/dL — ABNORMAL HIGH (ref 65–99)

## 2015-10-26 MED ORDER — LACTATED RINGERS IV BOLUS (SEPSIS)
2000.0000 mL | Freq: Once | INTRAVENOUS | Status: AC
  Administered 2015-10-26: 2000 mL via INTRAVENOUS

## 2015-10-26 NOTE — Discharge Instructions (Signed)
If you are unable to insulin what you eat is very important.  Having elevated blood sugar puts you at increased risk for heart attacks, strokes and infections.  Discuss with your family doctor your current blood sugar regimen.

## 2015-10-26 NOTE — ED Notes (Signed)
Pts blood glucose was checked in triage. It was 563.

## 2015-10-26 NOTE — ED Triage Notes (Addendum)
Pt states she has not eaten like she was supposed to today and does not remember if she took her meds. Also states she was drooling about a week ago for 4-5 days and then stopped and it did it again. Staggering. "Mind wasn't right."

## 2015-10-26 NOTE — ED Provider Notes (Signed)
[place] [facility] DEPT [facility] Provider Note   CSN: [id number] Arrival date & time: 10/26/15  2101  By signing my name below, I, Reola Mosher, attest that this documentation has been prepared under the direction and in the presence of Deno Etienne, DO. Electronically Signed: Reola Mosher, ED Scribe. 10/26/15. 9:49 PM.  History   Chief Complaint Chief Complaint  Patient presents with  . Hyperglycemia   The history is provided by the patient. No language interpreter was used.  Hyperglycemia  Blood sugar level PTA:  High Severity:  Moderate Onset quality:  Gradual Timing:  Constant Progression:  Worsening Chronicity:  Recurrent Diabetes status:  Controlled with insulin and controlled with oral medications Current diabetic therapy:  Metformin and Glipizide Context: noncompliance   Relieved by:  Nothing Ineffective treatments:  Insulin and oral agents Associated symptoms: no chest pain, no dizziness, no dysuria, no fever, no nausea, no shortness of breath and no vomiting    HPI Comments: Allison [name] is a 81 y.o. female with a PMHx of DM, who presents to the Emergency Department complaining of hyperglycemia over the past several days. Pt states that she has been taking her CBG at home which she notes has been reading as high, and that she has been unable to control this. She reports associated difficulty with ambulation secondary to her her current episode. Pt notes that she has had difficulty with PO intake and has recently been noncompliant w/ her rx'd DM medication since the onset of her episode. She reports that she is unable to tolerate insulin recently secondary to complications. Pt has also been taking Metformin and Glipizide at home as well with minimal control of her blood sugar levels. Denies fever, cough, congestion, CP, SOB, or any other associated symptoms.   Past Medical History:  Diagnosis Date  . Carpal tunnel syndrome   . Diabetes mellitus without  complication ([place])   . Dysphagia   . GERD (gastroesophageal reflux disease)   . Hiatal hernia   . HLD (hyperlipidemia)   . Hx of adenomatous colonic polyps 06/19/2014  . Hypertension   . Hypothyroidism   . Onychomycosis 03/05/2012  . Pain in lower limb 04/18/2013  . Sciatic nerve pain   . Vertigo    Patient Active Problem List   Diagnosis Date Noted  . Hx of adenomatous colonic polyps 06/19/2014  . Neuropathic pain 03/27/2014  . Uncontrolled type 2 diabetes with neuropathy ([place]) 03/27/2014  . Bilateral carpal tunnel syndrome 03/27/2014  . AKI (acute kidney injury) ([place]) 08/05/2013  . DM type 2, uncontrolled, with renal complications ([place]) 0000000  . Hypercalcemia 08/05/2013  . PVD (peripheral vascular disease) ([place]) 03/05/2012  . Onychomycosis 03/05/2012  . Other hammer toe (acquired) 03/05/2012  . Abnormal CXR 12/25/2011  . Hypothyroidism 06/23/2006  . DYSLIPIDEMIA 06/23/2006  . Essential hypertension 06/23/2006  . GERD 06/23/2006  . IBS 06/23/2006   Past Surgical History:  Procedure Laterality Date  . BLADDER SURGERY     Mesh implant  . COLONOSCOPY    . LARYNX SURGERY     vocal cords  . PARTIAL HYSTERECTOMY  1978  . VESICOVAGINAL FISTULA CLOSURE W/ TAH  1976   OB History    No data available     Home Medications    Prior to Admission medications   Medication Sig Start Date End Date Taking? Authorizing Provider  AMBULATORY NON FORMULARY MEDICATION Apply to your wrist daily 03/27/14   Blanchie Serve, MD  amLODipine (NORVASC) 10 MG tablet Take 1  tablet (10 mg total) by mouth daily. For blood pressure 09/04/15   Gildardo Cranker, DO  aspirin EC 81 MG tablet Take 1 tablet (81 mg total) by mouth daily. 03/27/14   Blanchie Serve, MD  atorvastatin (LIPITOR) 40 MG tablet take 1 tablet by mouth at bedtime 07/11/15   Gildardo Cranker, DO  betamethasone dipropionate (DIPROLENE) 0.05 % cream Apply topically 2 (two) times daily. 05/31/15   Gildardo Cranker, DO  chlorthalidone (HYGROTON) 25  MG tablet take 1 tablet by mouth once daily 12/17/14   Gildardo Cranker, DO  gabapentin (NEURONTIN) 100 MG capsule Take 2 capsules (200 mg total) by mouth 3 (three) times daily. For neuropathy 12/12/14   Gildardo Cranker, DO  glipiZIDE (GLUCOTROL XL) 10 MG 24 hr tablet Take 1 tablet (10 mg total) by mouth daily with breakfast. 05/24/15   Gildardo Cranker, DO  levothyroxine (SYNTHROID, LEVOTHROID) 150 MCG tablet Take 1 tablet (150 mcg total) by mouth every morning. For thyroid 09/04/15   Gildardo Cranker, DO  losartan (COZAAR) 100 MG tablet Take 1 tablet (100 mg total) by mouth daily. For blood pressure 09/04/15   Gildardo Cranker, DO  metFORMIN (GLUCOPHAGE) 500 MG tablet Take 2 tablets (1,000 mg total) by mouth 2 (two) times daily with a meal. 02/14/15   Gildardo Cranker, DO  nystatin cream (MYCOSTATIN) Apply 1 application topically 2 (two) times daily. 05/31/15   Gildardo Cranker, DO  omeprazole (PRILOSEC) 20 MG capsule take 1 capsule by mouth once daily 07/11/15   Gildardo Cranker, DO  [facility] DELICA LANCETS FINE MISC Use to test blood sugar once daily. Dx: E11.9 10/08/15   Gildardo Cranker, DO  vitamin E (VITAMIN E) 1000 UNIT capsule Take 1,000 Units by mouth daily.    Historical Provider, MD   Family History Family History  Problem Relation Age of Onset  . Alzheimer's disease Mother   . Glaucoma Mother   . Heart disease Father   . Alzheimer's disease Father   . Asthma Sister   . Allergies Sister   . Allergies Sister   . Sleep apnea Sister   . Heart disease Sister   . Breast cancer Maternal Aunt   . Pancreatic cancer Maternal Aunt   . Prostate cancer Maternal Uncle   . Prostate cancer Paternal Uncle   . Alcoholism Maternal Uncle   . Kidney disease Maternal Aunt   . Heart disease Maternal Aunt    Social History Social History  Substance Use Topics  . Smoking status: Never Smoker  . Smokeless tobacco: Never Used  . Alcohol use No   Allergies   Pineapple; Codeine; Invokana [canagliflozin]; Lantus [insulin  glargine]; Levemir [insulin detemir]; Penicillins; Tradjenta [linagliptin]; and Vioxx [rofecoxib]  Review of Systems Review of Systems  Constitutional: Positive for appetite change. Negative for chills and fever.  HENT: Negative for congestion and rhinorrhea.   Eyes: Negative for redness and visual disturbance.  Respiratory: Negative for cough, shortness of breath and wheezing.   Cardiovascular: Negative for chest pain and palpitations.  Gastrointestinal: Negative for nausea and vomiting.  Genitourinary: Negative for dysuria and urgency.  Musculoskeletal: Positive for gait problem. Negative for arthralgias and myalgias.  Skin: Negative for pallor and wound.  Neurological: Negative for dizziness and headaches.  All other systems reviewed and are negative.  Physical Exam Updated Vital Signs BP 163/88 (BP Location: Left Arm)   Pulse 88   Temp 98 F (36.7 C) (Oral)   Resp 18   Ht 5\' 4"  (1.626 m)   Wt 146 lb  1.6 oz (66.3 kg)   SpO2 99%   BMI 25.08 kg/m   Physical Exam  Constitutional: She is oriented to person, place, and time. She appears well-developed and well-nourished. No distress.  HENT:  Head: Normocephalic and atraumatic.  Eyes: EOM are normal. Pupils are equal, round, and reactive to light.  Neck: Normal range of motion. Neck supple.  Cardiovascular: Normal rate and regular rhythm.  Exam reveals no gallop and no friction rub.   No murmur heard. Pulmonary/Chest: Effort normal. She has no wheezes. She has no rales.  Abdominal: Soft. She exhibits no distension. There is no tenderness.  Musculoskeletal: She exhibits no edema or tenderness.  Neurological: She is alert and oriented to person, place, and time.  Skin: Skin is warm and dry. She is not diaphoretic.  Psychiatric: She has a normal mood and affect. Her behavior is normal.  Nursing note and vitals reviewed.  ED Treatments / Results  DIAGNOSTIC STUDIES: Oxygen Saturation is 99% on RA, normal by my interpretation.    COORDINATION OF CARE: 9:49 PM-Discussed next steps with pt. Pt verbalized understanding and is agreeable with the plan.   Labs (all labs ordered are listed, but only abnormal results are displayed) Labs Reviewed  BASIC METABOLIC PANEL - Abnormal; Notable for the following:       Result Value   Sodium 133 (*)    Chloride 94 (*)    Glucose, Bld 600 (*)    Creatinine, Ser 1.16 (*)    GFR calc non Af Amer 44 (*)    GFR calc Af Amer 51 (*)    All other components within normal limits  URINALYSIS, ROUTINE W REFLEX MICROSCOPIC (NOT AT [facility]) - Abnormal; Notable for the following:    Glucose, UA >1000 (*)    All other components within normal limits  URINE MICROSCOPIC-ADD ON - Abnormal; Notable for the following:    Squamous Epithelial / LPF 0-5 (*)    Bacteria, UA RARE (*)    All other components within normal limits  I-STAT VENOUS BLOOD GAS, ED - Abnormal; Notable for the following:    pO2, Ven 19.0 (*)    Bicarbonate 34.9 (*)    Acid-Base Excess 8.0 (*)    All other components within normal limits  CBC WITH DIFFERENTIAL/PLATELET   EKG  EKG Interpretation None      Radiology No results found.  Procedures Procedures   Medications Ordered in ED Medications  lactated ringers bolus 2,000 mL (2,000 mLs Intravenous New Bag/Given 10/26/15 2206)   Initial Impression / Assessment and Plan / ED Course  I have reviewed the triage vital signs and the nursing notes.  Pertinent labs & imaging results that were available during my care of the patient were reviewed by me and considered in my medical decision making (see chart for details).  Clinical Course   81 yo F With a chief complaint of hyperglycemia. It sounds likely diet related secondary to history. Patient is actively chewing gum that is not sugar-free. She also has been gaining a lot of Pepsi today. Shoulders that she also has a coconut cream pie at home.  Will give fluids, rule out dka.   Labs with hyperglycemia  without dka.  D/c home.   11:06 PM:  I have discussed the diagnosis/risks/treatment options with the patient and family and believe the pt to be eligible for discharge home to follow-up with PCP. We also discussed returning to the ED immediately if new or worsening sx occur. We discussed  the sx which are most concerning (e.g., sudden worsening pain, fever, inability to tolerate by mouth) that necessitate immediate return. Medications administered to the patient during their visit and any new prescriptions provided to the patient are listed below.  Medications given during this visit Medications  lactated ringers bolus 2,000 mL (2,000 mLs Intravenous New Bag/Given 10/26/15 2206)     The patient appears reasonably screen and/or stabilized for discharge and I doubt any other medical condition or other [facility] requiring further screening, evaluation, or treatment in the ED at this time prior to discharge.    Final Clinical Impressions(s) / ED Diagnoses   Final diagnoses:  Hyperglycemia   New Prescriptions New Prescriptions   No medications on file   I personally performed the services described in this documentation, which was scribed in my presence. The recorded information has been reviewed and is accurate.     Deno Etienne, DO 10/26/15 2306

## 2015-10-27 LAB — CBG MONITORING, ED: Glucose-Capillary: 563 mg/dL (ref 65–99)

## 2015-10-28 ENCOUNTER — Inpatient Hospital Stay (HOSPITAL_COMMUNITY): Payer: Medicare Other

## 2015-10-28 ENCOUNTER — Inpatient Hospital Stay (HOSPITAL_COMMUNITY)
Admission: EM | Admit: 2015-10-28 | Discharge: 2015-11-01 | DRG: 066 | Disposition: A | Discharge status: Home Health | Payer: Medicare Other | Attending: Family Medicine | Admitting: Family Medicine

## 2015-10-28 ENCOUNTER — Emergency Department (HOSPITAL_COMMUNITY): Payer: Medicare Other

## 2015-10-28 ENCOUNTER — Encounter (HOSPITAL_COMMUNITY): Payer: Self-pay

## 2015-10-28 DIAGNOSIS — I6501 Occlusion and stenosis of right vertebral artery: Secondary | ICD-10-CM | POA: Diagnosis present

## 2015-10-28 DIAGNOSIS — I639 Cerebral infarction, unspecified: Secondary | ICD-10-CM

## 2015-10-28 DIAGNOSIS — Z880 Allergy status to penicillin: Secondary | ICD-10-CM

## 2015-10-28 DIAGNOSIS — I638 Other cerebral infarction: Principal | ICD-10-CM

## 2015-10-28 DIAGNOSIS — Z79899 Other long term (current) drug therapy: Secondary | ICD-10-CM

## 2015-10-28 DIAGNOSIS — E118 Type 2 diabetes mellitus with unspecified complications: Secondary | ICD-10-CM

## 2015-10-28 DIAGNOSIS — E785 Hyperlipidemia, unspecified: Secondary | ICD-10-CM | POA: Diagnosis not present

## 2015-10-28 DIAGNOSIS — R441 Visual hallucinations: Secondary | ICD-10-CM | POA: Diagnosis not present

## 2015-10-28 DIAGNOSIS — I10 Essential (primary) hypertension: Secondary | ICD-10-CM

## 2015-10-28 DIAGNOSIS — R1310 Dysphagia, unspecified: Secondary | ICD-10-CM | POA: Diagnosis present

## 2015-10-28 DIAGNOSIS — Z888 Allergy status to other drugs, medicaments and biological substances status: Secondary | ICD-10-CM

## 2015-10-28 DIAGNOSIS — R2990 Unspecified symptoms and signs involving the nervous system: Secondary | ICD-10-CM

## 2015-10-28 DIAGNOSIS — E1129 Type 2 diabetes mellitus with other diabetic kidney complication: Secondary | ICD-10-CM | POA: Diagnosis present

## 2015-10-28 DIAGNOSIS — R51 Headache: Secondary | ICD-10-CM | POA: Diagnosis not present

## 2015-10-28 DIAGNOSIS — R260 Ataxic gait: Secondary | ICD-10-CM | POA: Diagnosis present

## 2015-10-28 DIAGNOSIS — I63011 Cerebral infarction due to thrombosis of right vertebral artery: Secondary | ICD-10-CM | POA: Diagnosis not present

## 2015-10-28 DIAGNOSIS — R443 Hallucinations, unspecified: Secondary | ICD-10-CM

## 2015-10-28 DIAGNOSIS — R410 Disorientation, unspecified: Secondary | ICD-10-CM | POA: Diagnosis not present

## 2015-10-28 DIAGNOSIS — Z8249 Family history of ischemic heart disease and other diseases of the circulatory system: Secondary | ICD-10-CM | POA: Diagnosis not present

## 2015-10-28 DIAGNOSIS — K219 Gastro-esophageal reflux disease without esophagitis: Secondary | ICD-10-CM | POA: Diagnosis present

## 2015-10-28 DIAGNOSIS — Z9114 Patient's other noncompliance with medication regimen: Secondary | ICD-10-CM | POA: Diagnosis not present

## 2015-10-28 DIAGNOSIS — Z7984 Long term (current) use of oral hypoglycemic drugs: Secondary | ICD-10-CM | POA: Diagnosis not present

## 2015-10-28 DIAGNOSIS — IMO0002 Reserved for concepts with insufficient information to code with codable children: Secondary | ICD-10-CM | POA: Diagnosis present

## 2015-10-28 DIAGNOSIS — I63031 Cerebral infarction due to thrombosis of right carotid artery: Secondary | ICD-10-CM | POA: Diagnosis not present

## 2015-10-28 DIAGNOSIS — E1165 Type 2 diabetes mellitus with hyperglycemia: Secondary | ICD-10-CM | POA: Diagnosis present

## 2015-10-28 DIAGNOSIS — Z794 Long term (current) use of insulin: Secondary | ICD-10-CM

## 2015-10-28 DIAGNOSIS — E1121 Type 2 diabetes mellitus with diabetic nephropathy: Secondary | ICD-10-CM

## 2015-10-28 DIAGNOSIS — R29700 NIHSS score 0: Secondary | ICD-10-CM | POA: Diagnosis present

## 2015-10-28 DIAGNOSIS — E039 Hypothyroidism, unspecified: Secondary | ICD-10-CM | POA: Diagnosis present

## 2015-10-28 DIAGNOSIS — I6300 Cerebral infarction due to thrombosis of unspecified precerebral artery: Secondary | ICD-10-CM | POA: Diagnosis not present

## 2015-10-28 DIAGNOSIS — Z91018 Allergy to other foods: Secondary | ICD-10-CM

## 2015-10-28 DIAGNOSIS — I63019 Cerebral infarction due to thrombosis of unspecified vertebral artery: Secondary | ICD-10-CM | POA: Diagnosis not present

## 2015-10-28 DIAGNOSIS — H5316 Psychophysical visual disturbances: Secondary | ICD-10-CM | POA: Diagnosis not present

## 2015-10-28 DIAGNOSIS — I739 Peripheral vascular disease, unspecified: Secondary | ICD-10-CM | POA: Diagnosis not present

## 2015-10-28 HISTORY — DX: Unspecified symptoms and signs involving the nervous system: R29.90

## 2015-10-28 HISTORY — DX: Cerebral infarction, unspecified: I63.9

## 2015-10-28 LAB — BASIC METABOLIC PANEL
Anion gap: 10 (ref 5–15)
BUN: 9 mg/dL (ref 6–20)
CALCIUM: 9.9 mg/dL (ref 8.9–10.3)
CHLORIDE: 98 mmol/L — AB (ref 101–111)
CO2: 27 mmol/L (ref 22–32)
CREATININE: 0.77 mg/dL (ref 0.44–1.00)
GFR calc Af Amer: >60 mL/min (ref >60)
GFR calc non Af Amer: >60 mL/min (ref >60)
GLUCOSE: 293 mg/dL — AB (ref 65–99)
Potassium: 3.5 mmol/L (ref 3.5–5.1)
Sodium: 135 mmol/L (ref 135–145)

## 2015-10-28 LAB — URINALYSIS, ROUTINE W REFLEX MICROSCOPIC
BILIRUBIN URINE: NEGATIVE
HGB URINE DIPSTICK: NEGATIVE
Ketones, ur: NEGATIVE mg/dL
Nitrite: NEGATIVE
PROTEIN: NEGATIVE mg/dL
Specific Gravity, Urine: 1.02 (ref 1.005–1.030)
pH: 7.5 (ref 5.0–8.0)

## 2015-10-28 LAB — CBC
HCT: 38.5 % (ref 36.0–46.0)
Hemoglobin: 13.1 g/dL (ref 12.0–15.0)
MCH: 27 pg (ref 26.0–34.0)
MCHC: 34 g/dL (ref 30.0–36.0)
MCV: 79.4 fL (ref 78.0–100.0)
PLATELETS: 252 10*3/uL (ref 150–400)
RBC: 4.85 MIL/uL (ref 3.87–5.11)
RDW: 13.4 % (ref 11.5–15.5)
WBC: 10.1 10*3/uL (ref 4.0–10.5)

## 2015-10-28 LAB — VITAMIN B12: VITAMIN B 12: 630 pg/mL (ref 180–914)

## 2015-10-28 LAB — URINE MICROSCOPIC-ADD ON

## 2015-10-28 LAB — CBG MONITORING, ED
GLUCOSE-CAPILLARY: 304 mg/dL — AB (ref 65–99)
Glucose-Capillary: 244 mg/dL — ABNORMAL HIGH (ref 65–99)
Glucose-Capillary: 277 mg/dL — ABNORMAL HIGH (ref 65–99)
Glucose-Capillary: 295 mg/dL — ABNORMAL HIGH (ref 65–99)

## 2015-10-28 LAB — GLUCOSE, CAPILLARY
GLUCOSE-CAPILLARY: 218 mg/dL — AB (ref 65–99)
Glucose-Capillary: 314 mg/dL — ABNORMAL HIGH (ref 65–99)

## 2015-10-28 LAB — TSH: TSH: 1.885 u[IU]/mL (ref 0.350–4.500)

## 2015-10-28 MED ORDER — SENNOSIDES-DOCUSATE SODIUM 8.6-50 MG PO TABS: 1.0000 | ORAL_TABLET | Freq: Every evening | ORAL | Status: DC | PRN

## 2015-10-28 MED ORDER — PANTOPRAZOLE SODIUM 40 MG PO TBEC
40.0000 mg | DELAYED_RELEASE_TABLET | Freq: Every day | ORAL | Status: DC
  Administered 2015-10-28 – 2015-11-01 (×5): 40 mg via ORAL
  Filled 2015-10-28 (×5): qty 1

## 2015-10-28 MED ORDER — VITAMIN E 45 MG (100 UNIT) PO CAPS
1000.0000 [IU] | ORAL_CAPSULE | Freq: Every day | ORAL | Status: DC
  Administered 2015-10-29 – 2015-11-01 (×4): 1000 [IU] via ORAL
  Filled 2015-10-28 (×6): qty 2

## 2015-10-28 MED ORDER — LIVING WELL WITH DIABETES BOOK
Freq: Once | Status: AC
  Administered 2015-10-28: 11:00:00
  Filled 2015-10-28: qty 1

## 2015-10-28 MED ORDER — LEVOTHYROXINE SODIUM 75 MCG PO TABS
150.0000 ug | ORAL_TABLET | Freq: Every day | ORAL | Status: DC
  Administered 2015-10-29 – 2015-11-01 (×4): 150 ug via ORAL
  Filled 2015-10-28 (×5): qty 2

## 2015-10-28 MED ORDER — CLOPIDOGREL BISULFATE 75 MG PO TABS
75.0000 mg | ORAL_TABLET | Freq: Every day | ORAL | Status: DC
  Administered 2015-10-28 – 2015-11-01 (×5): 75 mg via ORAL
  Filled 2015-10-28 (×5): qty 1

## 2015-10-28 MED ORDER — INSULIN ASPART 100 UNIT/ML ~~LOC~~ SOLN
0.0000 [IU] | Freq: Three times a day (TID) | SUBCUTANEOUS | Status: DC
  Administered 2015-10-29: 5 [IU] via SUBCUTANEOUS
  Administered 2015-10-29 (×2): 8 [IU] via SUBCUTANEOUS
  Administered 2015-10-30 (×2): 5 [IU] via SUBCUTANEOUS
  Administered 2015-10-30: 3 [IU] via SUBCUTANEOUS
  Administered 2015-10-31: 8 [IU] via SUBCUTANEOUS
  Administered 2015-10-31 (×2): 5 [IU] via SUBCUTANEOUS
  Administered 2015-11-01: 3 [IU] via SUBCUTANEOUS
  Administered 2015-11-01: 5 [IU] via SUBCUTANEOUS

## 2015-10-28 MED ORDER — ACETAMINOPHEN 650 MG RE SUPP: 650.0000 mg | RECTAL | Status: DC | PRN

## 2015-10-28 MED ORDER — SODIUM CHLORIDE 0.9 % IV SOLN: INTRAVENOUS | Status: AC

## 2015-10-28 MED ORDER — GABAPENTIN 100 MG PO CAPS
200.0000 mg | ORAL_CAPSULE | Freq: Three times a day (TID) | ORAL | Status: DC
  Administered 2015-10-28 – 2015-10-29 (×3): 200 mg via ORAL
  Filled 2015-10-28 (×4): qty 2

## 2015-10-28 MED ORDER — ENOXAPARIN SODIUM 40 MG/0.4ML ~~LOC~~ SOLN
40.0000 mg | SUBCUTANEOUS | Status: DC
  Administered 2015-10-28 – 2015-10-31 (×4): 40 mg via SUBCUTANEOUS
  Filled 2015-10-28 (×4): qty 0.4

## 2015-10-28 MED ORDER — ATORVASTATIN CALCIUM 40 MG PO TABS
40.0000 mg | ORAL_TABLET | Freq: Every day | ORAL | Status: DC
  Administered 2015-10-28 – 2015-10-31 (×4): 40 mg via ORAL
  Filled 2015-10-28 (×4): qty 1

## 2015-10-28 MED ORDER — INSULIN ASPART 100 UNIT/ML ~~LOC~~ SOLN
0.0000 [IU] | Freq: Every day | SUBCUTANEOUS | Status: DC
  Administered 2015-10-28: 4 [IU] via SUBCUTANEOUS
  Administered 2015-10-31: 2 [IU] via SUBCUTANEOUS

## 2015-10-28 MED ORDER — LEVOTHYROXINE SODIUM 150 MCG PO TABS: 150.0000 ug | ORAL_TABLET | Freq: Every morning | ORAL | Status: DC

## 2015-10-28 MED ORDER — STROKE: EARLY STAGES OF RECOVERY BOOK
Freq: Once | Status: AC
  Administered 2015-10-28: 12:00:00
  Filled 2015-10-28: qty 1

## 2015-10-28 MED ORDER — ACETAMINOPHEN 325 MG PO TABS: 650.0000 mg | ORAL_TABLET | ORAL | Status: DC | PRN

## 2015-10-28 NOTE — Progress Notes (Signed)
Patient gave permission to give her earring to her son.  Earrings removed and given to her son.

## 2015-10-28 NOTE — ED Notes (Signed)
Pt taken to MRI  

## 2015-10-28 NOTE — ED Triage Notes (Signed)
Pt states seen at hospital yesterday for glucose > 600. Pt states sent home today. Has been "seeing her mother all day long." States mother has been dead for many years. Pt denies any verbal or auditory hallucinations at this time. Pt denies any pain. CBG = 266 at triage.

## 2015-10-28 NOTE — ED Notes (Signed)
Pt taken to EEG  

## 2015-10-28 NOTE — Progress Notes (Signed)
Inpatient Diabetes Program Recommendations  AACE/ADA: New Consensus Statement on Inpatient Glycemic Control (2015)  Target Ranges:  Prepandial:   less than 140 mg/dL      Peak postprandial:   less than 180 mg/dL (1-2 hours)      Critically ill patients:  140 - 180 mg/dL   Lab Results  Component Value Date   GLUCAP 304 (H) 10/28/2015   HGBA1C 10.5 (H) 09/03/2015   Review of Glycemic Control  Diabetes history: DM 2 Outpatient Diabetes medications: Metformin 1000 mg BID, Glipizide 10 mg Daily Current orders for Inpatient glycemic control: None  A1c 10.5% on 09/03/15 uncontrolled  Inpatient Diabetes Program Recommendations:   Glucose elevated while here. Consider Novolog Moderate Correction TID + HS scale. May also want to order A1c level while here. Ordered Living well with DM booklet, DM educational videos while waiting in ED, Dietitian consult. Patient has insurance, if appropriate please order Ambulatory refferal for DM education at the [facility] (has to be ordered and signed by MD and select DM diagnosis when placing order).  Thanks,  Tama Headings RN, MSN, [facility] Inpatient Diabetes Coordinator Team Pager [phone] (8a-5p)

## 2015-10-28 NOTE — ED Notes (Signed)
Pt passed swallow screen this morning, took some meds. Tray ordered for her. Pt in MRI now. VSS, alert, a&ox4, denies pain

## 2015-10-28 NOTE — H&P (Signed)
History and Physical    Allison [name] [id number] DOB: 08-23-1934 DOA: 10/28/2015  PCP: Gildardo Cranker, DO Patient coming from: home  Chief Complaint: abnormal gait/hallucinations  HPI: Allison [name] is a very pleasant 81 y.o. female with medical history significant for diabetes, hyperlipidemia, hypertension, hypothyroidism, GERD, noncompliance presents emergency Department chief complaint mid and visual hallucinations. Initial workup in the emergency department includes a CT of the head revealing right lacunar infarct at called at head.  Information is obtained from the patient and the sister who is at the bedside and with whom the patient resides. Patient reports about a week ago she had sudden onset headache and drooling. Sister reports patient also demonstrated slurred speech and some difficulty swallowing. She describes the headache as sharp constant located across "the top of my head". She reports she took some Tylenol and went to bed. The symptoms resolved but the sister reports she has demonstrated an unsteady gait since that time. Since reports she appeared to be "tripping over her right foot". Stated with symptoms during this timeframe is under mitten visual hallucinations. The patient reports seeing her mother in the kitchen and her mother died in 05-10-09. The hallucinations persisted the sister decided to bring the patient to the emergency department. Of note patient was in the emergency department 2 days ago with an elevated serum glucose. She was treated and discharged. Patient denies having seen her mother since she came to the emergency department. She denies any visual disturbances numbness tingling of extremities perceived weakness of her arms or legs. She denies chest pain palpitations shortness of breath abdominal pain nausea vomiting. She denies any fever chills dysuria hematuria frequency. She does endorse some urinary urgency. She denies any lower extremity edema or  orthopnea    ED Course: Emergency department she's afebrile hemodynamically stable and not hypoxic.  Review of Systems: As per HPI otherwise 10 point review of systems negative.   Ambulatory Status: Prior to onset of symptoms patient ambulated independently with a steady gait no recent falls  Past Medical History:  Diagnosis Date  . Carpal tunnel syndrome   . Diabetes mellitus without complication ([place])   . Dysphagia   . GERD (gastroesophageal reflux disease)   . Hiatal hernia   . HLD (hyperlipidemia)   . Hx of adenomatous colonic polyps 06/19/2014  . Hypertension   . Hypothyroidism   . Onychomycosis 03/05/2012  . Pain in lower limb 04/18/2013  . Sciatic nerve pain   . Vertigo     Past Surgical History:  Procedure Laterality Date  . BLADDER SURGERY     Mesh implant  . COLONOSCOPY    . LARYNX SURGERY     vocal cords  . PARTIAL HYSTERECTOMY  1978  . VESICOVAGINAL FISTULA CLOSURE W/ TAH  1976    Social History   Social History  . Marital status: Single    Spouse name: N/A  . Number of children: 2  . Years of education: N/A   Occupational History  . Retired    Social History Main Topics  . Smoking status: Never Smoker  . Smokeless tobacco: Never Used  . Alcohol use No  . Drug use: No  . Sexual activity: No   Other Topics Concern  . Not on file   Social History Narrative   Single   2 sons   Retired   2 cups caffeine daily   05/01/2014       Allergies  Allergen Reactions  . Pineapple Anaphylaxis  .  Codeine Other (See Comments)    unknown  . Invokana [Canagliflozin] Other (See Comments)    Constipation, nightmares  . Lantus [Insulin Glargine] Itching and Swelling    angioedema  . Levemir [Insulin Detemir] Itching and Swelling    angioedema  . Penicillins Other (See Comments)    Unknown   . Tradjenta [Linagliptin] Other (See Comments)  . Vioxx [Rofecoxib] Other (See Comments)    unknown    Family History  Problem Relation Age of Onset  .  Alzheimer's disease Mother   . Glaucoma Mother   . Heart disease Father   . Alzheimer's disease Father   . Asthma Sister   . Allergies Sister   . Allergies Sister   . Sleep apnea Sister   . Heart disease Sister   . Breast cancer Maternal Aunt   . Pancreatic cancer Maternal Aunt   . Prostate cancer Maternal Uncle   . Prostate cancer Paternal Uncle   . Alcoholism Maternal Uncle   . Kidney disease Maternal Aunt   . Heart disease Maternal Aunt     Prior to Admission medications   Medication Sig Start Date End Date Taking? Authorizing Provider  AMBULATORY NON FORMULARY MEDICATION Apply to your wrist daily 03/27/14   Blanchie Serve, MD  amLODipine (NORVASC) 10 MG tablet Take 1 tablet (10 mg total) by mouth daily. For blood pressure 09/04/15   Gildardo Cranker, DO  aspirin EC 81 MG tablet Take 1 tablet (81 mg total) by mouth daily. 03/27/14   Blanchie Serve, MD  atorvastatin (LIPITOR) 40 MG tablet take 1 tablet by mouth at bedtime 07/11/15   Gildardo Cranker, DO  betamethasone dipropionate (DIPROLENE) 0.05 % cream Apply topically 2 (two) times daily. 05/31/15   Gildardo Cranker, DO  chlorthalidone (HYGROTON) 25 MG tablet take 1 tablet by mouth once daily 12/17/14   Gildardo Cranker, DO  gabapentin (NEURONTIN) 100 MG capsule Take 2 capsules (200 mg total) by mouth 3 (three) times daily. For neuropathy 12/12/14   Gildardo Cranker, DO  glipiZIDE (GLUCOTROL XL) 10 MG 24 hr tablet Take 1 tablet (10 mg total) by mouth daily with breakfast. 05/24/15   Gildardo Cranker, DO  levothyroxine (SYNTHROID, LEVOTHROID) 150 MCG tablet Take 1 tablet (150 mcg total) by mouth every morning. For thyroid 09/04/15   Gildardo Cranker, DO  losartan (COZAAR) 100 MG tablet Take 1 tablet (100 mg total) by mouth daily. For blood pressure 09/04/15   Gildardo Cranker, DO  metFORMIN (GLUCOPHAGE) 500 MG tablet Take 2 tablets (1,000 mg total) by mouth 2 (two) times daily with a meal. 02/14/15   Gildardo Cranker, DO  nystatin cream (MYCOSTATIN) Apply 1 application  topically 2 (two) times daily. 05/31/15   Gildardo Cranker, DO  omeprazole (PRILOSEC) 20 MG capsule take 1 capsule by mouth once daily 07/11/15   Gildardo Cranker, DO  [facility] DELICA LANCETS FINE MISC Use to test blood sugar once daily. Dx: E11.9 10/08/15   Gildardo Cranker, DO  vitamin E (VITAMIN E) 1000 UNIT capsule Take 1,000 Units by mouth daily.    Historical Provider, MD    Physical Exam: Vitals:   10/28/15 0930 10/28/15 0945 10/28/15 1000 10/28/15 1015  BP: 119/61 105/57 117/61 120/64  Pulse: 92 88 89 90  Resp:      Temp:      TempSrc:      SpO2: 99% 100% 99% 99%     General:  Appears calm and comfortable, no acute distress Eyes:  PERRL, EOMI, normal lids, iris ENT:  grossly normal hearing, lips & tongue, mucous membranes of her mouth are moist and pink Neck:  no LAD, masses or thyromegaly Cardiovascular:  RRR, no m/r/g. No LE edema. Pedal pulses present and palpable just nontender to palpation Respiratory:  CTA bilaterally, no w/r/r. Normal respiratory effort. Abdomen:  soft, ntnd, positive bowel sounds throughout no guarding or rebounding Skin:  no rash or induration seen on limited exam Musculoskeletal:  grossly normal tone BUE/BLE, good ROM, no bony abnormality Psychiatric:  grossly normal mood and affect, speech fluent and appropriate, AOx3 Neurologic:  CN 2-12 grossly intact, moves all extremities in coordinated fashion, sensation intact. Speech clear facial symmetry. Lateral grip 5 out of 5 lower extremity strength 5 out of 5 bilaterally tongue midline  Labs on Admission: I have personally reviewed following labs and imaging studies  CBC:  Recent Labs Lab 10/26/15 2157 10/28/15 0029  WBC 7.7 10.1  NEUTROABS 4.2  --   HGB 13.9 13.1  HCT 40.8 38.5  MCV 80.2 79.4  PLT 248 [id number]   Basic Metabolic Panel:  Recent Labs Lab 10/26/15 2157 10/28/15 0029  NA 133* 135  K 4.1 3.5  CL 94* 98*  CO2 30 27  GLUCOSE 600* 293*  BUN 12 9  CREATININE 1.16* 0.77  CALCIUM 9.8 9.9     GFR: Estimated Creatinine Clearance: 54.3 mL/min (by C-G formula based on SCr of 0.77 mg/dL). Liver Function Tests: No results for input(s): AST, ALT, ALKPHOS, BILITOT, PROT, ALBUMIN in the last 168 hours. No results for input(s): LIPASE, AMYLASE in the last 168 hours. No results for input(s): AMMONIA in the last 168 hours. Coagulation Profile: No results for input(s): INR, PROTIME in the last 168 hours. Cardiac Enzymes: No results for input(s): CKTOTAL, CKMB, CKMBINDEX, TROPONINI in the last 168 hours. BNP (last 3 results) No results for input(s): PROBNP in the last 8760 hours. HbA1C: No results for input(s): HGBA1C in the last 72 hours. CBG:  Recent Labs Lab 10/26/15 2109 10/26/15 2334 10/28/15 0025 10/28/15 0603 10/28/15 1005  GLUCAP 563* 459* 277* 304* 295*   Lipid Profile: No results for input(s): CHOL, HDL, LDLCALC, TRIG, CHOLHDL, LDLDIRECT in the last 72 hours. Thyroid Function Tests:  Recent Labs  10/28/15 0721  TSH 1.885   Anemia Panel: No results for input(s): VITAMINB12, FOLATE, FERRITIN, TIBC, IRON, RETICCTPCT in the last 72 hours. Urine analysis:    Component Value Date/Time   COLORURINE AMBER (A) 10/28/2015 0021   APPEARANCEUR CLEAR 10/28/2015 0021   LABSPEC 1.020 10/28/2015 0021   PHURINE 7.5 10/28/2015 0021   GLUCOSEU >1000 (A) 10/28/2015 0021   HGBUR NEGATIVE 10/28/2015 0021   BILIRUBINUR NEGATIVE 10/28/2015 0021   KETONESUR NEGATIVE 10/28/2015 0021   PROTEINUR NEGATIVE 10/28/2015 0021   UROBILINOGEN 0.2 03/29/2014 1055   NITRITE NEGATIVE 10/28/2015 0021   LEUKOCYTESUR SMALL (A) 10/28/2015 0021    Creatinine Clearance: Estimated Creatinine Clearance: 54.3 mL/min (by C-G formula based on SCr of 0.77 mg/dL).  Sepsis Labs: @LABRCNTIP (procalcitonin:4,lacticidven:4) )No results found for this or any previous visit (from the past 240 hour(s)).   Radiological Exams on Admission: Ct Head Wo Contrast  Result Date: 10/28/2015 CLINICAL DATA:   81 year old female with history of headache for the past 3 months. Recent hallucinations. EXAM: CT HEAD WITHOUT CONTRAST TECHNIQUE: Contiguous axial images were obtained from the base of the skull through the vertex without intravenous contrast. COMPARISON:  Head CT 06/06/2003. FINDINGS: Brain: Relatively well-defined area of low attenuation involving imaging of the right internal capsule as well  as the posterior aspect of the head of the right caudate nucleus, new compared to the prior examination; this is relatively well-defined, favored to represent an old lacunar infarct, however, lateral aspect of the lesion is slightly indistinct (best appreciated on image 12 of series 201). Patchy and confluent areas of decreased attenuation are noted throughout the deep and periventricular white matter of the cerebral hemispheres bilaterally, compatible with chronic microvascular ischemic disease. No acute intracranial abnormalities. Specifically, no acute intracranial hemorrhage, no mass, mass effect, hydrocephalus or abnormal intra or extra-axial fluid collections. Vascular: No hyperdense vessel or unexpected calcification. Skull: Normal. Negative for fracture or focal lesion. Sinuses/Orbits: No acute finding. Other: None. IMPRESSION: 1. New infarct involving the new right genu of the internal capsule and posterior aspect of the head of the right caudate nucleus. This is predominantly well-defined and very low-attenuation, suggesting that this is been present for a while and is likely old, however, the lateral borders of the lesion are slightly indistinct. The possibility of a late subacute infarction is not excluded. 2. Mild chronic microvascular ischemic changes in the cerebral white matter. These results were called by telephone at the time of interpretation on 10/28/2015 at 8:16 am to [place], who verbally acknowledged these results. Electronically Signed   By: Vinnie Langton M.D.   On: 10/28/2015 08:19     EKG: pending  Assessment/Plan Principal Problem:   CVA (cerebral vascular accident) [facility]) Active Problems:   Hypothyroidism   Dyslipidemia   Essential hypertension   GERD   PVD (peripheral vascular disease) ([place])   DM type 2, uncontrolled, with renal complications ([place])   Hallucinations   #1. CVA. Patient with slurred speech and drooling unsteady gait for 5 days. Also intermittent visual hallucinations. No History of psych issues. Risk factors include diabetes, hypertension, hyperlipidemia, noncompliance CT of the head right lacunar infarct at the caudate head. CT report indicates infarct well-defined likely old however concerned that something might be subacute. Evaluated by neurology who recommended admission for stroke workup -Admit to telemetry -Obtain a hemoglobin A1c fasting lipid panel -MRI/MRA of the brain -Echocardiogram -Rodded Dopplers -Plavix 75 mg per neuro -Continue statin -Home meds include low-dose aspirin will continue for now for dual therapy.  -Frequent neuro checks -Nothing by mouth until she passes the stroke swallow screen and then carb modified -EEG -PT and OT  #2. Hallucinations. Etiology unclear. no history of psych diagnosis. Medications same benign for this. No further hallucinations while in the emergency department. May be related to #1 -EEG per neuro -Appreciate neuro assistance -If continues and no physiologic etiology discovered, consider BH consult  #3. Diabetes. Poor control. Serum glucose 293 on admission. Of note she was in the emergency department 2 days ago with serum glucose of 600. Family reports history of noncompliance with diet and medications. Home medications include glipizide and metformin -Obtain a hemoglobin A1c -Hold oral agents for now -Use sliding scale insulin for optimal control  #4. Hypertension. Controlled in the emergency department. Home medications include amlodipine, [place], losartan -We'll hold this for  now -Monitor closely -We'll resume home meds as indicated  5. Hypothyroidism. Home medications include Synthroid -TSH -Continue home meds  #6. GERD. Appears stable at baseline. -Continue PPI    DVT prophylaxis: scd  Code Status: full  Family Communication: sister and son at bedside  Disposition Plan: home hopefully 24 hours  Consults called:  Neuro Dr Shon Hale Admission status: inpatient    Radene Gunning MD [facility]  If 7PM-7AM, please  contact night-coverage [email or web address] Password [email or web address]  10/28/2015, 10:57 AM

## 2015-10-28 NOTE — Consult Note (Signed)
Requesting Physician: Dr. Ashok Cordia    Chief Complaint: stroke on CT  History obtained from:  Patient    HPI:                                                                                                                                         Allison [name] is an 81 y.o. female with stroke risk factors of hypertension diabetes. Patient has no previous head CTs dating back to 2011. Patient presents to the ED with a one-week history of intermittent visual hallucinations. Per notes patient reports that she has been seeing her mother in the kitchen he's been deceased for many years. Patient has visual but denies any auditory hallucinations. Although patient's blood glucose has been elevated with need to be seen at [facility] [place] on 10/26/2015 patient has had no other medical issues. She states that although her blood glucoses have been managed patient continues to have visual hallucinations but denies auditory hallucinations. While in the ED patient obtain CT of head. CT of head returned with right lacunar infarct at the caudate head. This lacunar infarct is well-defined and likely very old however there is some areas that concerned radiologist that some might be subacute.  Patient denies any symptoms of stroke such as numbness tingling, weakness, lack of vision, word finding problems. She clearly states that she sees her mother in her kitchen but knows that she is not there. Her son who is next to her states that he does not feel that she has any neurocognitive decline. Patient still drives, cooks, does her own bills. Patient denies ever being lost with driving or having any problems with her bills.  Date last known well: Unable to determine Time last known well: Unable to determine tPA Given: No: no symptoms   Past Medical History:  Diagnosis Date  . Carpal tunnel syndrome   . Diabetes mellitus without complication ([place])   . Dysphagia   . GERD (gastroesophageal reflux disease)   . Hiatal  hernia   . HLD (hyperlipidemia)   . Hx of adenomatous colonic polyps 06/19/2014  . Hypertension   . Hypothyroidism   . Onychomycosis 03/05/2012  . Pain in lower limb 04/18/2013  . Sciatic nerve pain   . Vertigo     Past Surgical History:  Procedure Laterality Date  . BLADDER SURGERY     Mesh implant  . COLONOSCOPY    . LARYNX SURGERY     vocal cords  . PARTIAL HYSTERECTOMY  1978  . VESICOVAGINAL FISTULA CLOSURE W/ TAH  1976    Family History  Problem Relation Age of Onset  . Alzheimer's disease Mother   . Glaucoma Mother   . Heart disease Father   . Alzheimer's disease Father   . Asthma Sister   . Allergies Sister   . Allergies Sister   . Sleep apnea Sister   . Heart disease  Sister   . Breast cancer Maternal Aunt   . Pancreatic cancer Maternal Aunt   . Prostate cancer Maternal Uncle   . Prostate cancer Paternal Uncle   . Alcoholism Maternal Uncle   . Kidney disease Maternal Aunt   . Heart disease Maternal Aunt    Social History:  reports that she has never smoked. She has never used smokeless tobacco. She reports that she does not drink alcohol or use drugs.  Allergies:  Allergies  Allergen Reactions  . Pineapple Anaphylaxis  . Codeine Other (See Comments)    unknown  . Invokana [Canagliflozin] Other (See Comments)    Constipation, nightmares  . Lantus [Insulin Glargine] Itching and Swelling    angioedema  . Levemir [Insulin Detemir] Itching and Swelling    angioedema  . Penicillins Other (See Comments)    Unknown   . Tradjenta [Linagliptin] Other (See Comments)  . Vioxx [Rofecoxib] Other (See Comments)    unknown    Medications:                                                                                                                          No current facility-administered medications for this encounter.    Current Outpatient Prescriptions  Medication Sig Dispense Refill  . AMBULATORY NON FORMULARY MEDICATION Apply to your wrist daily 2 each 0   . amLODipine (NORVASC) 10 MG tablet Take 1 tablet (10 mg total) by mouth daily. For blood pressure 30 tablet 3  . aspirin EC 81 MG tablet Take 1 tablet (81 mg total) by mouth daily. 30 tablet 3  . atorvastatin (LIPITOR) 40 MG tablet take 1 tablet by mouth at bedtime 30 tablet 3  . betamethasone dipropionate (DIPROLENE) 0.05 % cream Apply topically 2 (two) times daily. 30 g 1  . chlorthalidone (HYGROTON) 25 MG tablet take 1 tablet by mouth once daily 30 tablet 3  . gabapentin (NEURONTIN) 100 MG capsule Take 2 capsules (200 mg total) by mouth 3 (three) times daily. For neuropathy 180 capsule 6  . glipiZIDE (GLUCOTROL XL) 10 MG 24 hr tablet Take 1 tablet (10 mg total) by mouth daily with breakfast. 30 tablet 6  . levothyroxine (SYNTHROID, LEVOTHROID) 150 MCG tablet Take 1 tablet (150 mcg total) by mouth every morning. For thyroid 30 tablet 3  . losartan (COZAAR) 100 MG tablet Take 1 tablet (100 mg total) by mouth daily. For blood pressure 30 tablet 3  . metFORMIN (GLUCOPHAGE) 500 MG tablet Take 2 tablets (1,000 mg total) by mouth 2 (two) times daily with a meal. 180 tablet 3  . nystatin cream (MYCOSTATIN) Apply 1 application topically 2 (two) times daily. 30 g 1  . omeprazole (PRILOSEC) 20 MG capsule take 1 capsule by mouth once daily 30 capsule 3  . ONETOUCH DELICA LANCETS FINE MISC Use to test blood sugar once daily. Dx: E11.9 100 each 3  . vitamin E (VITAMIN E) 1000 UNIT capsule Take 1,000 Units by mouth daily.  ROS:                                                                                                                                       History obtained from the patient  General ROS: negative for - chills, fatigue, fever, night sweats, weight gain or weight loss Psychological ROS: negative for - behavioral disorder, hallucinations, memory difficulties, mood swings or suicidal ideation Ophthalmic ROS: negative for - blurry vision, double vision, eye pain or loss of vision ENT  ROS: negative for - epistaxis, nasal discharge, oral lesions, sore throat, tinnitus or vertigo Allergy and Immunology ROS: negative for - hives or itchy/watery eyes Hematological and Lymphatic ROS: negative for - bleeding problems, bruising or swollen lymph nodes Endocrine ROS: negative for - galactorrhea, hair pattern changes, polydipsia/polyuria or temperature intolerance Respiratory ROS: negative for - cough, hemoptysis, shortness of breath or wheezing Cardiovascular ROS: negative for - chest pain, dyspnea on exertion, edema or irregular heartbeat Gastrointestinal ROS: negative for - abdominal pain, diarrhea, hematemesis, nausea/vomiting or stool incontinence Genito-Urinary ROS: negative for - dysuria, hematuria, incontinence or urinary frequency/urgency Musculoskeletal ROS: negative for - joint swelling or muscular weakness Neurological ROS: as noted in HPI Dermatological ROS: negative for rash and skin lesion changes  Neurologic Examination:                                                                                                      Blood pressure 144/78, pulse 63, temperature 97.9 F (36.6 C), temperature source Oral, resp. rate 18, SpO2 99 %.  HEENT-  Normocephalic, no lesions, without obvious abnormality.  Normal external eye and conjunctiva.  Normal TM's bilaterally.  Normal auditory canals and external ears. Normal external nose, mucus membranes and septum.  Normal pharynx. Cardiovascular- S1, S2 normal, pulses palpable throughout   Lungs- chest clear, no wheezing, rales, normal symmetric air entry Abdomen- normal findings: bowel sounds normal Extremities- no edema Lymph-no adenopathy palpable Musculoskeletal-no joint tenderness, deformity or swelling Skin-warm and dry, no hyperpigmentation, vitiligo, or suspicious lesions  Neurological Examination Mental Status: Alert, oriented, thought content appropriate.  Speech fluent without evidence of aphasia.  Able to follow 3  step commands without difficulty. Cranial Nerves: II: Discs flat bilaterally; Visual fields grossly normal, pupils equal, round, reactive to light and accommodation III,IV, VI: ptosis not present, extra-ocular motions intact bilaterally V,VII: smile symmetric, facial light touch sensation normal bilaterally VIII: hearing normal bilaterally IX,X: uvula rises symmetrically XI: bilateral shoulder shrug XII: midline tongue extension Motor:  Right : Upper extremity   5/5    Left:     Upper extremity   4/5  Lower extremity   5/5     Lower extremity   5/5 --Mild pronation drift on the left Tone and bulk:normal tone throughout; no atrophy noted Sensory: Pinprick and light touch intact throughout, bilaterally Deep Tendon Reflexes: 2+ and symmetric throughout in the upper extremities with 1+ knee jerk and no ankle jerk Plantars: Right: downgoing   Left: downgoing Cerebellar: normal finger-to-nose, and normal heel-to-shin test Gait: Not tested       Lab Results: Basic Metabolic Panel:  Recent Labs Lab 10/26/15 2157 10/28/15 0029  NA 133* 135  K 4.1 3.5  CL 94* 98*  CO2 30 27  GLUCOSE 600* 293*  BUN 12 9  CREATININE 1.16* 0.77  CALCIUM 9.8 9.9    Liver Function Tests: No results for input(s): AST, ALT, ALKPHOS, BILITOT, PROT, ALBUMIN in the last 168 hours. No results for input(s): LIPASE, AMYLASE in the last 168 hours. No results for input(s): AMMONIA in the last 168 hours.  CBC:  Recent Labs Lab 10/26/15 2157 10/28/15 0029  WBC 7.7 10.1  NEUTROABS 4.2  --   HGB 13.9 13.1  HCT 40.8 38.5  MCV 80.2 79.4  PLT 248 252    Cardiac Enzymes: No results for input(s): CKTOTAL, CKMB, CKMBINDEX, TROPONINI in the last 168 hours.  Lipid Panel: No results for input(s): CHOL, TRIG, HDL, CHOLHDL, VLDL, LDLCALC in the last 168 hours.  CBG:  Recent Labs Lab 10/26/15 2109 10/26/15 2334 10/28/15 0025 10/28/15 0603  GLUCAP 563* 459* 277* 304*    Microbiology: Results for  orders placed or performed in visit on 12/14/13  Fecal occult blood, imunochemical     Status: Abnormal   Collection Time: 12/14/13  9:34 AM  Result Value Ref Range Status   Fecal Occult Bld Positive (A) Negative Final    Coagulation Studies: No results for input(s): LABPROT, INR in the last 72 hours.  Imaging: Ct Head Wo Contrast  Result Date: 10/28/2015 CLINICAL DATA:  80 year old female with history of headache for the past 3 months. Recent hallucinations. EXAM: CT HEAD WITHOUT CONTRAST TECHNIQUE: Contiguous axial images were obtained from the base of the skull through the vertex without intravenous contrast. COMPARISON:  Head CT 06/06/2003. FINDINGS: Brain: Relatively well-defined area of low attenuation involving imaging of the right internal capsule as well as the posterior aspect of the head of the right caudate nucleus, new compared to the prior examination; this is relatively well-defined, favored to represent an old lacunar infarct, however, lateral aspect of the lesion is slightly indistinct (best appreciated on image 12 of series 201). Patchy and confluent areas of decreased attenuation are noted throughout the deep and periventricular white matter of the cerebral hemispheres bilaterally, compatible with chronic microvascular ischemic disease. No acute intracranial abnormalities. Specifically, no acute intracranial hemorrhage, no mass, mass effect, hydrocephalus or abnormal intra or extra-axial fluid collections. Vascular: No hyperdense vessel or unexpected calcification. Skull: Normal. Negative for fracture or focal lesion. Sinuses/Orbits: No acute finding. Other: None. IMPRESSION: 1. New infarct involving the new right genu of the internal capsule and posterior aspect of the head of the right caudate nucleus. This is predominantly well-defined and very low-attenuation, suggesting that this is been present for a while and is likely old, however, the lateral borders of the lesion are  slightly indistinct. The possibility of a late subacute infarction is not excluded. 2. Mild chronic microvascular ischemic changes  in the cerebral white matter. These results were called by telephone at the time of interpretation on 10/28/2015 at 8:16 am to [place], who verbally acknowledged these results. Electronically Signed   By: Vinnie Langton M.D.   On: 10/28/2015 08:19       Assessment and plan discussed with with attending physician and they are in agreement.    Etta Quill PA-C [facility] [phone]  10/28/2015, 8:49 AM   Assessment: 81 y.o. female with 1 week history of visual hallucinations with no auditory component. In speaking with patient's son and patient she has no history of neurocognitive decline. Patient does have anxiety but no other psych issues. Patient denies any abnormal taste or sensations while this is occurring. Patient came to the emergency department for further evaluation and CT of head showed punctate old right lacunar infarct with concern for possible subacute areas. Neuro was consultation for stroke.  1. HgbA1c, fasting lipid panel 2. MRI, MRA  of the brain without contrast 3. PT consult, OT consult, Speech consult 4. Echocardiogram 5. Carotid dopplers 6. Prophylactic therapy-Antiplatelet med: Plavix - dose 75 mg daily 7. Risk factor modification 8. Telemetry monitoring 9. Frequent neuro checks 10 NPO until passes stroke swallow screen 11. EEG to evaluate for possible seizures causing visual hallucinations 12 please page stroke NP  Or  PA  Or MD from 8am -4 pm  as this patient from this time will be  followed by the stroke.   You can look them up on [email or web address]  Password [email or web address]      Stroke Risk Factors - diabetes mellitus and hypertension    Personally examined patient and images, and have participated in and made any corrections needed to history, physical, neuro exam,assessment and plan as stated above.  I have personally  obtained the history, evaluated lab date, reviewed imaging studies and agree with radiology interpretations.    Sarina Ill, MD [facility] [phone]

## 2015-10-28 NOTE — Progress Notes (Signed)
EEG completed, results pending. 

## 2015-10-28 NOTE — ED Provider Notes (Signed)
[facility] DEPT Provider Note   CSN: [id number] Arrival date & time: 10/28/15  0001     History   Chief Complaint Chief Complaint  Patient presents with  . Hallucinations  . Hyperglycemia    HPI Allison [name] is a 81 y.o. female with history of diabetes, hypothyroidism who presents with a one-week history of intermittent visual hallucinations. Patient reports that she has been seeing her mother in her kitchen who has been deceased for many years. Patient denies auditory hallucinations. Patient has had elevated blood glucose over the past week and was seen at [facility] on 10/26/14 for hyperglycemia, given fluids and discharged. This was after eating many foods high in sugar. Patient has been improving her diet over the past 2 days. She has had improving blood sugars. However, patient continues to have visual hallucinations. She denies any at this time. She denies SI/HI. She denies taking any medications outside her normal regimen. However, she does state that many of her pills look similar. Patient denies any pain at this time. She states that she has had intermittent headache over the past 2-3 days. She denies any headache at this time. She states that it would become stronger and then resolves. She also denies any chest pain, shortness of breath, abdominal pain, nausea, vomiting, urinary symptoms.  HPI  Past Medical History:  Diagnosis Date  . Carpal tunnel syndrome   . Diabetes mellitus without complication ([place])   . Dysphagia   . GERD (gastroesophageal reflux disease)   . Hiatal hernia   . HLD (hyperlipidemia)   . Hx of adenomatous colonic polyps 06/19/2014  . Hypertension   . Hypothyroidism   . Onychomycosis 03/05/2012  . Pain in lower limb 04/18/2013  . Sciatic nerve pain   . Vertigo     Patient Active Problem List   Diagnosis Date Noted  . Hallucinations 10/28/2015  . Stroke-like symptoms 10/28/2015  . Hx of adenomatous colonic polyps 06/19/2014  .  Neuropathic pain 03/27/2014  . Uncontrolled type 2 diabetes with neuropathy ([place]) 03/27/2014  . Bilateral carpal tunnel syndrome 03/27/2014  . AKI (acute kidney injury) ([place]) 08/05/2013  . DM type 2, uncontrolled, with renal complications ([place]) 0000000  . Hypercalcemia 08/05/2013  . PVD (peripheral vascular disease) ([place]) 03/05/2012  . Onychomycosis 03/05/2012  . Other hammer toe (acquired) 03/05/2012  . Abnormal CXR 12/25/2011  . Hypothyroidism 06/23/2006  . Dyslipidemia 06/23/2006  . Essential hypertension 06/23/2006  . GERD 06/23/2006  . IBS 06/23/2006    Past Surgical History:  Procedure Laterality Date  . BLADDER SURGERY     Mesh implant  . COLONOSCOPY    . LARYNX SURGERY     vocal cords  . PARTIAL HYSTERECTOMY  1978  . VESICOVAGINAL FISTULA CLOSURE W/ TAH  1976    OB History    No data available       Home Medications    Prior to Admission medications   Medication Sig Start Date End Date Taking? Authorizing Provider  AMBULATORY NON FORMULARY MEDICATION Apply to your wrist daily 03/27/14   Blanchie Serve, MD  amLODipine (NORVASC) 10 MG tablet Take 1 tablet (10 mg total) by mouth daily. For blood pressure 09/04/15   Gildardo Cranker, DO  aspirin EC 81 MG tablet Take 1 tablet (81 mg total) by mouth daily. 03/27/14   Blanchie Serve, MD  atorvastatin (LIPITOR) 40 MG tablet take 1 tablet by mouth at bedtime 07/11/15   Gildardo Cranker, DO  betamethasone dipropionate (DIPROLENE) 0.05 % cream  Apply topically 2 (two) times daily. 05/31/15   Gildardo Cranker, DO  chlorthalidone (HYGROTON) 25 MG tablet take 1 tablet by mouth once daily 12/17/14   Gildardo Cranker, DO  gabapentin (NEURONTIN) 100 MG capsule Take 2 capsules (200 mg total) by mouth 3 (three) times daily. For neuropathy 12/12/14   Gildardo Cranker, DO  glipiZIDE (GLUCOTROL XL) 10 MG 24 hr tablet Take 1 tablet (10 mg total) by mouth daily with breakfast. 05/24/15   Gildardo Cranker, DO  levothyroxine (SYNTHROID, LEVOTHROID) 150 MCG tablet  Take 1 tablet (150 mcg total) by mouth every morning. For thyroid 09/04/15   Gildardo Cranker, DO  losartan (COZAAR) 100 MG tablet Take 1 tablet (100 mg total) by mouth daily. For blood pressure 09/04/15   Gildardo Cranker, DO  metFORMIN (GLUCOPHAGE) 500 MG tablet Take 2 tablets (1,000 mg total) by mouth 2 (two) times daily with a meal. 02/14/15   Gildardo Cranker, DO  nystatin cream (MYCOSTATIN) Apply 1 application topically 2 (two) times daily. 05/31/15   Gildardo Cranker, DO  omeprazole (PRILOSEC) 20 MG capsule take 1 capsule by mouth once daily 07/11/15   Gildardo Cranker, DO  [facility] DELICA LANCETS FINE MISC Use to test blood sugar once daily. Dx: E11.9 10/08/15   Gildardo Cranker, DO  vitamin E (VITAMIN E) 1000 UNIT capsule Take 1,000 Units by mouth daily.    Historical Provider, MD    Family History Family History  Problem Relation Age of Onset  . Alzheimer's disease Mother   . Glaucoma Mother   . Heart disease Father   . Alzheimer's disease Father   . Asthma Sister   . Allergies Sister   . Allergies Sister   . Sleep apnea Sister   . Heart disease Sister   . Breast cancer Maternal Aunt   . Pancreatic cancer Maternal Aunt   . Prostate cancer Maternal Uncle   . Prostate cancer Paternal Uncle   . Alcoholism Maternal Uncle   . Kidney disease Maternal Aunt   . Heart disease Maternal Aunt     Social History Social History  Substance Use Topics  . Smoking status: Never Smoker  . Smokeless tobacco: Never Used  . Alcohol use No     Allergies   Pineapple; Codeine; Invokana [canagliflozin]; Lantus [insulin glargine]; Levemir [insulin detemir]; Penicillins; Tradjenta [linagliptin]; and Vioxx [rofecoxib]   Review of Systems Review of Systems  Constitutional: Negative for chills and fever.  HENT: Negative for facial swelling and sore throat.   Respiratory: Negative for shortness of breath.   Cardiovascular: Negative for chest pain.  Gastrointestinal: Negative for abdominal pain, nausea and  vomiting.  Genitourinary: Negative for dysuria.  Musculoskeletal: Negative for back pain.  Skin: Negative for rash and wound.  Neurological: Positive for headaches.  Psychiatric/Behavioral: Positive for hallucinations. The patient is not nervous/anxious.      Physical Exam Updated Vital Signs BP 120/64   Pulse 90   Temp 97.9 F (36.6 C) (Oral)   Resp 18   SpO2 99%   Physical Exam  Constitutional: She appears well-developed and well-nourished. No distress.  HENT:  Head: Normocephalic and atraumatic.  Mouth/Throat: Oropharynx is clear and moist. No oropharyngeal exudate.  Eyes: Conjunctivae and EOM are normal. Pupils are equal, round, and reactive to light. Right eye exhibits no discharge. Left eye exhibits no discharge. No scleral icterus.  Neck: Normal range of motion. Neck supple. No thyromegaly present.  Cardiovascular: Normal rate, regular rhythm, normal heart sounds and intact distal pulses.  Exam reveals no  gallop and no friction rub.   No murmur heard. Pulmonary/Chest: Effort normal and breath sounds normal. No stridor. No respiratory distress. She has no wheezes. She has no rales.  Abdominal: Soft. Bowel sounds are normal. She exhibits no distension. There is no tenderness. There is no rebound and no guarding.  Musculoskeletal: She exhibits no edema.  Lymphadenopathy:    She has no cervical adenopathy.  Neurological: She is alert. Coordination normal.  CN 3-12 intact; normal sensation throughout; 5/5 strength in all 4 extremities; equal bilateral grip strength; no ataxia on finger to nose   Skin: Skin is warm and dry. No rash noted. She is not diaphoretic. No pallor.  Psychiatric: She has a normal mood and affect. Her speech is normal and behavior is normal. She is not actively hallucinating. She expresses no homicidal and no suicidal ideation.  Nursing note and vitals reviewed.    ED Treatments / Results  Labs (all labs ordered are listed, but only abnormal results  are displayed) Labs Reviewed  BASIC METABOLIC PANEL - Abnormal; Notable for the following:       Result Value   Chloride 98 (*)    Glucose, Bld 293 (*)    All other components within normal limits  URINALYSIS, ROUTINE W REFLEX MICROSCOPIC (NOT AT [facility]) - Abnormal; Notable for the following:    Color, Urine AMBER (*)    Glucose, UA >1000 (*)    Leukocytes, UA SMALL (*)    All other components within normal limits  URINE MICROSCOPIC-ADD ON - Abnormal; Notable for the following:    Squamous Epithelial / LPF 0-5 (*)    Bacteria, UA RARE (*)    All other components within normal limits  CBG MONITORING, ED - Abnormal; Notable for the following:    Glucose-Capillary 277 (*)    All other components within normal limits  CBG MONITORING, ED - Abnormal; Notable for the following:    Glucose-Capillary 304 (*)    All other components within normal limits  CBG MONITORING, ED - Abnormal; Notable for the following:    Glucose-Capillary 295 (*)    All other components within normal limits  CBC  TSH    EKG  EKG Interpretation None       Radiology Ct Head Wo Contrast  Result Date: 10/28/2015 CLINICAL DATA:  81 year old female with history of headache for the past 3 months. Recent hallucinations. EXAM: CT HEAD WITHOUT CONTRAST TECHNIQUE: Contiguous axial images were obtained from the base of the skull through the vertex without intravenous contrast. COMPARISON:  Head CT 06/06/2003. FINDINGS: Brain: Relatively well-defined area of low attenuation involving imaging of the right internal capsule as well as the posterior aspect of the head of the right caudate nucleus, new compared to the prior examination; this is relatively well-defined, favored to represent an old lacunar infarct, however, lateral aspect of the lesion is slightly indistinct (best appreciated on image 12 of series 201). Patchy and confluent areas of decreased attenuation are noted throughout the deep and periventricular white  matter of the cerebral hemispheres bilaterally, compatible with chronic microvascular ischemic disease. No acute intracranial abnormalities. Specifically, no acute intracranial hemorrhage, no mass, mass effect, hydrocephalus or abnormal intra or extra-axial fluid collections. Vascular: No hyperdense vessel or unexpected calcification. Skull: Normal. Negative for fracture or focal lesion. Sinuses/Orbits: No acute finding. Other: None. IMPRESSION: 1. New infarct involving the new right genu of the internal capsule and posterior aspect of the head of the right caudate nucleus. This is predominantly well-defined  and very low-attenuation, suggesting that this is been present for a while and is likely old, however, the lateral borders of the lesion are slightly indistinct. The possibility of a late subacute infarction is not excluded. 2. Mild chronic microvascular ischemic changes in the cerebral white matter. These results were called by telephone at the time of interpretation on 10/28/2015 at 8:16 am to [place], who verbally acknowledged these results. Electronically Signed   By: Vinnie Langton M.D.   On: 10/28/2015 08:19    Procedures Procedures (including critical care time)  Medications Ordered in ED Medications  living well with diabetes book MISC (not administered)     Initial Impression / Assessment and Plan / ED Course  I have reviewed the triage vital signs and the nursing notes.  Pertinent labs & imaging results that were available during my care of the patient were reviewed by me and considered in my medical decision making (see chart for details).  Clinical Course    CBC Unremarkable. BMP shows chloride 98, glucose 293. TSH 1.885. UA shows small leukocytes, glucose >1000, rare bacteria. CT head shows 1. New infarct involving the new right genu of the internal capsule and posterior aspect of the head of the right caudate nucleus. This is predominantly well-defined and very  low-attenuation, suggesting that this is been present for a while and is likely old, however, the lateral borders of the lesion are slightly indistinct. The possibility of a late subacute infarction is not excluded. 2. Mild chronic microvascular ischemic changes in the cerebral white matter. I consulted neurology to evaluate the patient and would like admission to hospitalist. I spoke with Dyanne Carrel, who will admit the patient to Dr. Marily Memos of [facility] for further evaluation and treatment. I discussed patient case with Dr. Ashok Cordia who guided the patient's management.  Final Clinical Impressions(s) / ED Diagnoses   Final diagnoses:  Hallucinations    New Prescriptions New Prescriptions   No medications on file     Frederica Kuster, PA-C 10/28/15 [place], MD 10/28/15 1445

## 2015-10-28 NOTE — Procedures (Signed)
HPI:  81 y/o with visual hallucinations  TECHNICAL SUMMARY:  A multichannel referential and bipolar montage EEG using the standard international 10-20 system was performed on the patient described as awake, drowsy and asleep.  The dominant background activity consists of 9 hertz activity seen most prominantly over the posterior head region.  The backgound activity is nonreactive to eye opening and closing procedures.  Low voltage fast (beta) activity is distributed symmetrically and maximally over the anterior head regions.  ACTIVATION:  Stepwise photic stimulation and hyperventilation are not performed.  EPILEPTIFORM ACTIVITY:  There were no spikes, sharp waves or paroxysmal activity.  SLEEP:  Stage 1 and 2 sleep architecture are noted.  CARDIAC:  The EKG lead revealed a regular sinus rhythm.  IMPRESSION:  This is a normal EEG for the patients stated age.  There were no focal, hemispheric or lateralizing features.  No epileptiform activity was recorded.  A normal EEG does not exclude the diagnosis of a seizure disorder and if seizure remains high on the list of differential diagnosis, an ambulatory EEG may be of value.  Clinical correlation is required.

## 2015-10-29 ENCOUNTER — Inpatient Hospital Stay (HOSPITAL_COMMUNITY): Payer: Medicare Other

## 2015-10-29 DIAGNOSIS — I639 Cerebral infarction, unspecified: Secondary | ICD-10-CM

## 2015-10-29 DIAGNOSIS — I63031 Cerebral infarction due to thrombosis of right carotid artery: Secondary | ICD-10-CM

## 2015-10-29 DIAGNOSIS — Z794 Long term (current) use of insulin: Secondary | ICD-10-CM

## 2015-10-29 DIAGNOSIS — R443 Hallucinations, unspecified: Secondary | ICD-10-CM

## 2015-10-29 DIAGNOSIS — H5316 Psychophysical visual disturbances: Secondary | ICD-10-CM

## 2015-10-29 DIAGNOSIS — E1165 Type 2 diabetes mellitus with hyperglycemia: Secondary | ICD-10-CM

## 2015-10-29 HISTORY — DX: Cerebral infarction, unspecified: I63.9

## 2015-10-29 LAB — LIPID PANEL
Cholesterol: 290 mg/dL — ABNORMAL HIGH (ref 0–200)
HDL: 41 mg/dL (ref >40)
LDL CALC: 213 mg/dL — AB (ref 0–99)
TRIGLYCERIDES: 180 mg/dL — AB (ref <150)
Total CHOL/HDL Ratio: 7.1 RATIO
VLDL: 36 mg/dL (ref 0–40)

## 2015-10-29 LAB — FOLATE RBC
FOLATE, HEMOLYSATE: 426.8 ng/mL
Folate, RBC: 1036 ng/mL (ref >498)
HEMATOCRIT: 41.2 % (ref 34.0–46.6)

## 2015-10-29 LAB — GLUCOSE, CAPILLARY
GLUCOSE-CAPILLARY: 242 mg/dL — AB (ref 65–99)
GLUCOSE-CAPILLARY: 261 mg/dL — AB (ref 65–99)
Glucose-Capillary: 272 mg/dL — ABNORMAL HIGH (ref 65–99)
Glucose-Capillary: 150 mg/dL — ABNORMAL HIGH (ref 65–99)

## 2015-10-29 LAB — HEMOGLOBIN A1C
HEMOGLOBIN A1C: 11.2 % — AB (ref 4.8–5.6)
Mean Plasma Glucose: 275 mg/dL

## 2015-10-29 MED ORDER — INSULIN ASPART PROT & ASPART (70-30 MIX) 100 UNIT/ML ~~LOC~~ SUSP
6.0000 [IU] | Freq: Two times a day (BID) | SUBCUTANEOUS | Status: DC
  Administered 2015-10-29 – 2015-11-01 (×6): 6 [IU] via SUBCUTANEOUS
  Filled 2015-10-29 (×2): qty 10

## 2015-10-29 MED ORDER — INSULIN STARTER KIT- PEN NEEDLES (ENGLISH)
1.0000 | Freq: Once | Status: AC
  Administered 2015-10-31: 1
  Filled 2015-10-29 (×2): qty 1

## 2015-10-29 MED ORDER — SODIUM CHLORIDE 0.9 % IV BOLUS (SEPSIS)
1000.0000 mL | Freq: Once | INTRAVENOUS | Status: AC
  Administered 2015-10-29: 1000 mL via INTRAVENOUS

## 2015-10-29 MED ORDER — LIVING WELL WITH DIABETES BOOK
Freq: Once | Status: AC
  Administered 2015-10-29: 12:00:00
  Filled 2015-10-29: qty 1

## 2015-10-29 NOTE — Progress Notes (Signed)
  RD consulted for nutrition education regarding diabetes.   Lab Results  Component Value Date   HGBA1C 11.2 (H) 10/28/2015    RD provided "Type 2 Diabetes Nutrition Therapy" handout from the Academy of [employer] Dietetics. Reviewed pt's diet recall. Sometimes she's eating balanced meals and sometimes eating high carb meals with additional 20+ ounces of Pepsi. Discussed different food groups and their effects on blood sugar, emphasizing carbohydrate-containing foods. Provided list of carbohydrates and recommended serving sizes of common foods.  Discussed importance of controlled and consistent carbohydrate intake throughout the day. Provided examples of ways to balance meals/snacks and encouraged intake of high-fiber, whole grain complex carbohydrates. Teach back method used.  Expect good compliance. Pt states that this stroke helped her realize she needs to make a change. She reports plans to decrease carb intake, eating more fruits and vegetables, and decrease intake of Pepsi.   Body mass index is 24.53 kg/m. Pt meets criteria for Normal Weight based on current BMI.  Current diet order is Carb Modified, patient is consuming approximately 75% of meals at this time. Labs and medications reviewed. No further nutrition interventions warranted at this time. RD contact information provided. If additional nutrition issues arise, please re-consult RD.  Scarlette Ar RD, CSP, LDN Inpatient Clinical Dietitian Pager: [phone] After Hours Pager: [phone]

## 2015-10-29 NOTE — Evaluation (Signed)
Occupational Therapy Evaluation Patient Details Name: Allison [name] MRN: [id number] DOB: September 28, 1934 Today's Date: 10/29/2015    History of Present Illness  Allison [name] is a very pleasant 81 y.o. female with medical history significant for diabetes, hyperlipidemia, hypertension, hypothyroidism, GERD, noncompliance presents emergency Department chief complaint mid and visual hallucinations. MRI:  Acute right internal capsule lacunar infarct   Clinical Impression   This 81 yo female admitted with above presents to acute OT with deficits below (see OT problem list) thus affecting her PLOF of Independent with basic ADLs, IADLs and driving. She will benefit from acute OT with follow up OPOT for vision.    Follow Up Recommendations  Outpatient OT    Equipment Recommendations  None recommended by OT       Precautions / Restrictions Precautions Precautions: Fall Restrictions Weight Bearing Restrictions: No      Mobility Bed Mobility Overal bed mobility: Independent                Transfers Overall transfer level: Needs assistance Equipment used: None Transfers: Sit to/from Stand Sit to Stand: Min guard                   ADL Overall ADL's : Needs assistance/impaired Eating/Feeding: Independent;Sitting   Grooming: Min guard;Standing   Upper Body Bathing: Set up;Sitting   Lower Body Bathing: Min guard;Sit to/from stand   Upper Body Dressing : Set up;Supervision/safety;Sitting   Lower Body Dressing: Min guard;Sit to/from stand   Toilet Transfer: Min guard;Ambulation;BSC (over toilet)   Toileting- Clothing Manipulation and Hygiene: Min guard;Sit to/from stand         General ADL Comments: Advised pt and son that pt does not need to drive until cleared by MD     Vision Vision Assessment?: Yes Eye Alignment: Within Functional Limits Ocular Range of Motion: Within Functional Limits Alignment/Gaze Preference: Within Defined  Limits Tracking/Visual Pursuits: Other (comment) (decreased overall tracking) Saccades: Additional eye shifts occurred during testing Convergence: Within functional limits Visual Fields: No apparent deficits          Pertinent Vitals/Pain Pain Assessment: No/denies pain     Hand Dominance Right   Extremity/Trunk Assessment Upper Extremity Assessment Upper Extremity Assessment: Overall WFL for tasks assessed (pt does report she has intermittent tremors of both UEs)           Communication Communication Communication: No difficulties   Cognition Arousal/Alertness: Lethargic ("tired") Behavior During Therapy: WFL for tasks assessed/performed Overall Cognitive Status: Within Functional Limits for tasks assessed                                Home Living Family/patient expects to be discharged to:: Private residence Living Arrangements: Children;Other relatives Available Help at Discharge: Family;Available 24 hours/day Type of Home: House Home Access: Stairs to enter [employer] of Steps: 1 Entrance Stairs-Rails: None Home Layout: One level     Bathroom Shower/Tub: Tub/shower unit;Curtain Shower/tub characteristics: Architectural technologist: Handicapped height     Home Equipment: None          Prior Functioning/Environment Level of Independence: Independent                 OT Problem List: Impaired vision/perception;Impaired balance (sitting and/or standing)   OT Treatment/Interventions: Balance training;Patient/family education;Visual/perceptual remediation/compensation    OT Goals(Current goals can be found in the care plan section) Acute Rehab OT Goals Patient Stated Goal: to get  back home OT Goal Formulation: With patient Time For Goal Achievement: 11/05/15 Potential to Achieve Goals: Good  OT Frequency: Min 2X/week              End of Session Equipment Utilized During Treatment: Gait belt Nurse Communication:  (RN  OK'd for pt to have a cup of coffee)  Activity Tolerance: Patient tolerated treatment well Patient left: in bed;with call bell/phone within reach;with bed alarm set;with family/visitor present   Time: [id number] OT Time Calculation (min): 23 min Charges:  OT General Charges $OT Visit: 1 Procedure OT Evaluation $OT Eval Moderate Complexity: 1 Procedure OT Treatments $Self Care/Home Management : 8-22 mins  Almon Register [id number] 10/29/2015, 2:31 PM

## 2015-10-29 NOTE — Evaluation (Signed)
Physical Therapy Evaluation Patient Details Name: STEFANI GRITTER MRN: [id number] DOB: [birth year] Today's Date: 10/29/2015   History of Present Illness   Allison [name] is a very pleasant 81 y.o. female with medical history significant for diabetes, hyperlipidemia, hypertension, hypothyroidism, GERD, noncompliance presents emergency Department chief complaint mid and visual hallucinations. MRI:  Acute right internal capsule lacunar infarct  Clinical Impression  Pt admitted with above. Pt presenting with mild balance impairment and LE weakness. Pt to benefit from outpt to address these deficits to minimize fall risk.    Follow Up Recommendations Outpatient PT;Supervision/Assistance - 24 hour    Equipment Recommendations  None recommended by PT    Recommendations for Other Services       Precautions / Restrictions Precautions Precautions: Fall Restrictions Weight Bearing Restrictions: No      Mobility  Bed Mobility Overal bed mobility: Independent                Transfers Overall transfer level: Needs assistance Equipment used: None Transfers: Sit to/from Stand Sit to Stand: Min guard         General transfer comment: definite use of hands  Ambulation/Gait Ambulation/Gait assistance: Min guard Ambulation Distance (Feet): 150 Feet Assistive device: None Gait Pattern/deviations: Step-through pattern;Decreased stride length Gait velocity: slow Gait velocity interpretation: Below normal speed for age/gender General Gait Details: mild instability, decreased L LE step length and heigh  Stairs Stairs: Yes Stairs assistance: Min guard Stair Management: Two rails;Alternating pattern Number of Stairs: 2 General stair comments: pt doesn't have railing at home  Wheelchair Mobility    Modified Rankin (Stroke Patients Only) Modified Rankin (Stroke Patients Only) Pre-Morbid Rankin Score: No significant disability Modified Rankin: No significant disability     Balance                                 Standardized Balance Assessment Standardized Balance Assessment : Dynamic Gait Index   Dynamic Gait Index Level Surface: Mild Impairment Change in Gait Speed: Mild Impairment Gait with Horizontal Head Turns: Mild Impairment Gait with Vertical Head Turns: Mild Impairment Gait and Pivot Turn: Mild Impairment Step Over Obstacle: Mild Impairment Step Around Obstacles: Mild Impairment Steps: Mild Impairment Total Score: 16       Pertinent Vitals/Pain Pain Assessment: No/denies pain    Home Living Family/patient expects to be discharged to:: Private residence Living Arrangements: Children;Other relatives Available Help at Discharge: Family;Available 24 hours/day Type of Home: House Home Access: Stairs to enter Entrance Stairs-Rails: None Entrance Stairs-Number of Steps: 1 Home Layout: One level Home Equipment: None      Prior Function Level of Independence: Independent               Hand Dominance   Dominant Hand: Right    Extremity/Trunk Assessment   Upper Extremity Assessment: Defer to OT evaluation           Lower Extremity Assessment: Generalized weakness      Cervical / Trunk Assessment: Normal  Communication   Communication: No difficulties  Cognition Arousal/Alertness: Awake/alert ("tired") Behavior During Therapy: WFL for tasks assessed/performed Overall Cognitive Status: Within Functional Limits for tasks assessed                      General Comments      Exercises     Assessment/Plan    PT Assessment Patient needs continued PT services  PT Problem List Decreased  strength;Decreased range of motion;Decreased activity tolerance;Decreased balance;Decreased mobility          PT Treatment Interventions DME instruction;Gait training;Stair training;Functional mobility training;Therapeutic activities;Therapeutic exercise;Balance training    PT Goals (Current goals can be  found in the Care Plan section)  Acute Rehab PT Goals Patient Stated Goal: home PT Goal Formulation: With patient/family Time For Goal Achievement: 11/05/15 Potential to Achieve Goals: Good Additional Goals Additional Goal #1: Pt to score >19 on DGI to indicate minimal falls risk.    Frequency Min 3X/week   Barriers to discharge        Co-evaluation               End of Session Equipment Utilized During Treatment: Gait belt Activity Tolerance: Patient tolerated treatment well Patient left: in bed;with call bell/phone within reach;with family/visitor present Nurse Communication: Mobility status         Time: 1422-1436 PT Time Calculation (min) (ACUTE ONLY): 14 min   Charges:   PT Evaluation $PT Eval Moderate Complexity: 1 Procedure     PT G CodesKingsley Callander 10/29/2015, 2:56 PM   Kittie Plater, PT, DPT Pager #: [phone] Office #: [phone]

## 2015-10-29 NOTE — Progress Notes (Signed)
STROKE TEAM PROGRESS NOTE   HISTORY OF PRESENT ILLNESS (per record) Allison [name] is an 81 y.o. female with stroke risk factors of hypertension diabetes. Patient has no previous head CTs dating back to 2011. Patient presents to the ED with a one-week history of intermittent visual hallucinations. Per notes patient reports that she has been seeing her mother in the kitchen he's been deceased for many years. Patient has visual but denies any auditory hallucinations. Although patient's blood glucose has been elevated with need to be seen at [facility] [place] on 10/26/2015 patient has had no other medical issues. She states that although her blood glucoses have been managed patient continues to have visual hallucinations but denies auditory hallucinations. While in the ED patient obtain CT of head. CT of head returned with right lacunar infarct at the caudate head. This lacunar infarct is well-defined and likely very old however there is some areas that concerned radiologist that some might be subacute.  Patient denies any symptoms of stroke such as numbness tingling, weakness, lack of vision, word finding problems. She clearly states that she sees her mother in her kitchen but knows that she is not there. Her son who is next to her states that he does not feel that she has any neurocognitive decline. Patient still drives, cooks, does her own bills. Patient denies ever being lost with driving or having any problems with her bills.  Her last known well is unable to be determined. Patient was not administered IV t-PA secondary to Symptom resolution. She was admitted for further evaluation and treatment.   SUBJECTIVE (INTERVAL HISTORY) No one is at the bedside.  Overall she feels her condition is stable. Patient admits to hallucinations (seeing her mohter) for the past 2 weeks. Realized they were abnormal.   OBJECTIVE Temp:  [97.7 F (36.5 C)-98.5 F (36.9 C)] 97.7 F (36.5 C) (10/17 0951) Pulse  Rate:  [73-87] 87 (10/17 0951) Cardiac Rhythm: Normal sinus rhythm (10/17 0742) Resp:  [16-18] 18 (10/17 0951) BP: (108-129)/(59-88) 108/59 (10/17 0951) SpO2:  [92 %-100 %] 99 % (10/17 0951) Weight:  [64.8 kg (142 lb 14.4 oz)] 64.8 kg (142 lb 14.4 oz) (10/16 1630)  CBC:  Recent Labs Lab 10/26/15 2157 10/28/15 0029  WBC 7.7 10.1  NEUTROABS 4.2  --   HGB 13.9 13.1  HCT 40.8 38.5  MCV 80.2 79.4  PLT 248 [id number]    Basic Metabolic Panel:  Recent Labs Lab 10/26/15 2157 10/28/15 0029  NA 133* 135  K 4.1 3.5  CL 94* 98*  CO2 30 27  GLUCOSE 600* 293*  BUN 12 9  CREATININE 1.16* 0.77  CALCIUM 9.8 9.9    Lipid Panel:    Component Value Date/Time   CHOL 290 (H) 10/29/2015 0352   CHOL 273 (H) 05/22/2015 0845   TRIG 180 (H) 10/29/2015 0352   HDL 41 10/29/2015 0352   HDL 54 05/22/2015 0845   CHOLHDL 7.1 10/29/2015 0352   VLDL 36 10/29/2015 0352   LDLCALC 213 (H) 10/29/2015 0352   LDLCALC 198 (H) 05/22/2015 0845   HgbA1c:  Lab Results  Component Value Date   HGBA1C 11.2 (H) 10/28/2015   Urine Drug Screen: No results found for: LABOPIA, COCAINSCRNUR, LABBENZ, AMPHETMU, THCU, LABBARB    IMAGING  Dg Chest 2 View  Result Date: 10/28/2015 CLINICAL DATA:  Patient with history of CVA.  Confusion. EXAM: CHEST  2 VIEW COMPARISON:  Chest radiograph 10/22/2012. FINDINGS: Normal cardiac and mediastinal contours. No consolidative  pulmonary opacities. No pleural effusion or pneumothorax. Mid thoracic spine degenerative changes. IMPRESSION: No active cardiopulmonary disease. Electronically Signed   By: Lovey Newcomer M.D.   On: 10/28/2015 12:21   Ct Head Wo Contrast  Result Date: 10/28/2015 CLINICAL DATA:  81 year old female with history of headache for the past 3 months. Recent hallucinations. EXAM: CT HEAD WITHOUT CONTRAST TECHNIQUE: Contiguous axial images were obtained from the base of the skull through the vertex without intravenous contrast. COMPARISON:  Head CT 06/06/2003.  FINDINGS: Brain: Relatively well-defined area of low attenuation involving imaging of the right internal capsule as well as the posterior aspect of the head of the right caudate nucleus, new compared to the prior examination; this is relatively well-defined, favored to represent an old lacunar infarct, however, lateral aspect of the lesion is slightly indistinct (best appreciated on image 12 of series 201). Patchy and confluent areas of decreased attenuation are noted throughout the deep and periventricular white matter of the cerebral hemispheres bilaterally, compatible with chronic microvascular ischemic disease. No acute intracranial abnormalities. Specifically, no acute intracranial hemorrhage, no mass, mass effect, hydrocephalus or abnormal intra or extra-axial fluid collections. Vascular: No hyperdense vessel or unexpected calcification. Skull: Normal. Negative for fracture or focal lesion. Sinuses/Orbits: No acute finding. Other: None. IMPRESSION: 1. New infarct involving the new right genu of the internal capsule and posterior aspect of the head of the right caudate nucleus. This is predominantly well-defined and very low-attenuation, suggesting that this is been present for a while and is likely old, however, the lateral borders of the lesion are slightly indistinct. The possibility of a late subacute infarction is not excluded. 2. Mild chronic microvascular ischemic changes in the cerebral white matter. These results were called by telephone at the time of interpretation on 10/28/2015 at 8:16 am to [place], who verbally acknowledged these results. Electronically Signed   By: Vinnie Langton M.D.   On: 10/28/2015 08:19   Mr Brain Wo Contrast  Result Date: 10/28/2015 CLINICAL DATA:  81 year old female with headache and recent hallucinations. Right internal capsule lacunar infarct which is new compared to 2005 on head CT today. Initial encounter. EXAM: MRI HEAD WITHOUT CONTRAST MRA HEAD WITHOUT  CONTRAST TECHNIQUE: Multiplanar, multiecho pulse sequences of the brain and surrounding structures were obtained without intravenous contrast. Angiographic images of the head were obtained using MRA technique without contrast. COMPARISON:  Head CT 0801 hours today. FINDINGS: MRI HEAD FINDINGS Brain: 1.7 cm of confluent restricted diffusion along the right genu and posterior limb internal capsule and tracking to the right cauda thalamic groove corresponding to the noncontrast head CT finding today. Associated T2 and FLAIR hyperintensity. Fairly isointense T1 appearance of the lesion. No associated hemorrhage. No significant mass effect. No other restricted diffusion or acute infarction. No midline shift, mass effect, evidence of mass lesion, ventriculomegaly, extra-axial collection or acute intracranial hemorrhage. Cervicomedullary junction and pituitary are within normal limits. Scattered nonspecific cerebral white matter T2 and FLAIR hyperintensity in both hemispheres. Central T2 hyperintensity in the pons, mild. No cortical encephalomalacia. No chronic cerebral blood products identified. Outside of the acute findings, the deep gray matter nuclei are largely unremarkable. Vascular: Appear normal. Skull and upper cervical spine: Widespread cervical spine chronic disc and endplate degeneration. Multilevel mild degenerative cervical spinal stenosis suspected. Normal bone marrow signal. Sinuses/Orbits: Visualized paranasal sinuses and mastoids are stable and well pneumatized. Normal orbits soft tissues. Negative scalp soft tissues. Other: Visible internal auditory structures appear normal. MRA HEAD FINDINGS Antegrade flow in  the posterior circulation. However, there is moderate to severe distal right vertebral artery stenosis beyond the right PICA origin. The distal left vertebral artery has a more normal appearance. The left AICA appears dominant. There is moderate basilar artery irregularity and stenosis, worst in  the distal basilar segment between the left AICA and SCA origins (series 7, image 53). Both SCA origins remain patent. The right PCA origin is patent. Fetal type left PCA. The right posterior communicating artery also is present. There is tandem moderate severe irregularity and stenosis in the right P1 and P2 segments. There is preserved distal right PCA flow. There is only mild left PCA irregularity and stenosis. Antegrade flow in both ICA siphons. There is moderate irregularity in both cavernous ICA segments and in the left supraclinoid ICA. There is mild to moderate stenosis in the distal left ICA just proximal to the terminus (series 703, image 7). No other hemodynamically significant siphon stenosis identified. Probable atherosclerotic pseudo lesion of the posterior aspect of the distal vertical petrous segment on the right (image 10). Patent bilateral MCA and ACA origins. Moderate irregularity and stenosis at the right MCA origin. Mild irregularity and stenosis in the right M1 segment. Early left MCA branching. No left MCA branch occlusion identified. Mild to moderate left ACA origin stenosis. Anterior communicating artery and visualized ACA branches are within normal limits. IMPRESSION: 1. Acute right internal capsule lacunar infarct, corresponding to that on the CT today. No associated hemorrhage or mass effect. 2. Negative for emergent large vessel occlusion. Extensive intracranial atherosclerosis. Up to severe stenosis of the proximal right PCA which could be associated with the acute infarct in #1 (right velum striate artery territory). 3. Severe distal right vertebral artery stenosis. Up to moderate stenoses also in the basilar artery, left ICA supraclinoid segment, left MCA origin, left ACA origin. 4. Comparatively mild for age signal changes elsewhere in the cerebral white matter and brainstem compatible with underlying chronic small vessel disease. Electronically Signed   By: Genevie Ann M.D.   On:  10/28/2015 16:06   Mr Jodene Nam Head/brain [id number] Cm  Result Date: 10/28/2015 CLINICAL DATA:  81 year old female with headache and recent hallucinations. Right internal capsule lacunar infarct which is new compared to 2005 on head CT today. Initial encounter. EXAM: MRI HEAD WITHOUT CONTRAST MRA HEAD WITHOUT CONTRAST TECHNIQUE: Multiplanar, multiecho pulse sequences of the brain and surrounding structures were obtained without intravenous contrast. Angiographic images of the head were obtained using MRA technique without contrast. COMPARISON:  Head CT 0801 hours today. FINDINGS: MRI HEAD FINDINGS Brain: 1.7 cm of confluent restricted diffusion along the right genu and posterior limb internal capsule and tracking to the right cauda thalamic groove corresponding to the noncontrast head CT finding today. Associated T2 and FLAIR hyperintensity. Fairly isointense T1 appearance of the lesion. No associated hemorrhage. No significant mass effect. No other restricted diffusion or acute infarction. No midline shift, mass effect, evidence of mass lesion, ventriculomegaly, extra-axial collection or acute intracranial hemorrhage. Cervicomedullary junction and pituitary are within normal limits. Scattered nonspecific cerebral white matter T2 and FLAIR hyperintensity in both hemispheres. Central T2 hyperintensity in the pons, mild. No cortical encephalomalacia. No chronic cerebral blood products identified. Outside of the acute findings, the deep gray matter nuclei are largely unremarkable. Vascular: Appear normal. Skull and upper cervical spine: Widespread cervical spine chronic disc and endplate degeneration. Multilevel mild degenerative cervical spinal stenosis suspected. Normal bone marrow signal. Sinuses/Orbits: Visualized paranasal sinuses and mastoids are stable and well pneumatized. Normal orbits  soft tissues. Negative scalp soft tissues. Other: Visible internal auditory structures appear normal. MRA HEAD FINDINGS Antegrade  flow in the posterior circulation. However, there is moderate to severe distal right vertebral artery stenosis beyond the right PICA origin. The distal left vertebral artery has a more normal appearance. The left AICA appears dominant. There is moderate basilar artery irregularity and stenosis, worst in the distal basilar segment between the left AICA and SCA origins (series 7, image 53). Both SCA origins remain patent. The right PCA origin is patent. Fetal type left PCA. The right posterior communicating artery also is present. There is tandem moderate severe irregularity and stenosis in the right P1 and P2 segments. There is preserved distal right PCA flow. There is only mild left PCA irregularity and stenosis. Antegrade flow in both ICA siphons. There is moderate irregularity in both cavernous ICA segments and in the left supraclinoid ICA. There is mild to moderate stenosis in the distal left ICA just proximal to the terminus (series 703, image 7). No other hemodynamically significant siphon stenosis identified. Probable atherosclerotic pseudo lesion of the posterior aspect of the distal vertical petrous segment on the right (image 10). Patent bilateral MCA and ACA origins. Moderate irregularity and stenosis at the right MCA origin. Mild irregularity and stenosis in the right M1 segment. Early left MCA branching. No left MCA branch occlusion identified. Mild to moderate left ACA origin stenosis. Anterior communicating artery and visualized ACA branches are within normal limits. IMPRESSION: 1. Acute right internal capsule lacunar infarct, corresponding to that on the CT today. No associated hemorrhage or mass effect. 2. Negative for emergent large vessel occlusion. Extensive intracranial atherosclerosis. Up to severe stenosis of the proximal right PCA which could be associated with the acute infarct in #1 (right velum striate artery territory). 3. Severe distal right vertebral artery stenosis. Up to moderate  stenoses also in the basilar artery, left ICA supraclinoid segment, left MCA origin, left ACA origin. 4. Comparatively mild for age signal changes elsewhere in the cerebral white matter and brainstem compatible with underlying chronic small vessel disease. Electronically Signed   By: Genevie Ann M.D.   On: 10/28/2015 16:06   Carotid Doppler   There is 1-39% bilateral ICA stenosis. Vertebral artery flow is antegrade.    EEG This is a normal EEG for the patients stated age.  There were no focal, hemispheric or lateralizing features.  No epileptiform activity was recorded.  A normal EEG does not exclude the diagnosis of a seizure disorder and if seizure remains high on the list of differential diagnosis, an ambulatory EEG may be of value.  Clinical correlation is required.  PHYSICAL EXAM Frail elderly african american lady not in distress. . Afebrile. Head is nontraumatic. Neck is supple without bruit.    Cardiac exam no murmur or gallop. Lungs are clear to auscultation. Distal pulses are well felt. Neurological Exam ;  Awake  Alert oriented x 3. Normal speech and language.eye movements full without nystagmus.fundi were not visualized. Vision acuity and fields appear normal. Hearing is normal. Palatal movements are normal. Face symmetric. Tongue midline. Normal strength, tone, reflexes and coordination. Normal sensation. Gait deferred.  ASSESSMENT/PLAN Ms. Allison [name] is a 81 y.o. female with history of hypertension and diabetes presenting with visual hallucinations. She did not receive IV t-PA due to delay in arrival.   Stroke:  Right internal capsule infarct, secondary to small vessel disease  from uncontrolled risk factors  MRI  R internal capsule infarct   MRA  no emergent large vessel occlusion. Severe stenosis proximal R PCA, R VA. Moderate stenosis BA, L ICA, L MCA, L ACA.  Carotid Doppler  no significant stenosis  2D Echo  pending   EEG No seizure activity  LDL 213  HgbA1c  11.2  Lovenox 40 mg sq daily for VTE prophylaxis  Diet heart healthy/carb modified Room service appropriate? Yes; Fluid consistency: Thin  aspirin 81 mg daily prior to admission, changed to clopidogrel 75 mg daily  Patient counseled to be compliant with her antithrombotic medications  Ongoing aggressive stroke risk factor management  Therapy recommendations:  OP PT, OT  Disposition:  pending   Hypertension  Stable  Permissive hypertension (OK if < 220/120) but gradually normalize in 5-7 days  Long-term BP goal normotensive  Hyperlipidemia  Home meds:  lipitor 40 - admits to only taking once a week, resumed in hospital  LDL 213, goal < 70  Continue Lipitor daily. Patient educated.   Continue statin at discharge  Diabetes type II  HgbA1c 11.2, goal < 7.0  Uncontrolled  Other Stroke Risk Factors  Advanced age  Other Active Problems  Visual hallucinations  Hypothyroidism  Hospital day # [place] for Pager information 10/29/2015 4:48 PM  I have personally examined this patient, reviewed notes, independently viewed imaging studies, participated in medical decision making and plan of care.ROS completed by me personally and pertinent positives fully documented  I have made any additions or clarifications directly to the above note. Agree with note above. Interesting presentation with hallucinations and confusion without any focal findings but MRI shows scan shows a fairly large 1.5 cm right basal ganglia infarct from small vessel disease. She needs ongoing stroke evaluation and remains at risk for recurrent stroke or TIAs. Greater than 50% time during this 25 minute visit was spent on counseling and coordination of care code stroke risk prevention and treatment  Antony Contras, MD Medical Director [place] Pager: [phone] 10/29/2015 5:18 PM  To contact Stroke Continuity provider, please refer to  [email or web address] After hours, contact General Neurology

## 2015-10-29 NOTE — Progress Notes (Signed)
OT Cancellation Note  Patient Details Name: Allison [name] MRN: [id number] DOB: [birth year]   Cancelled Treatment:    Reason Eval/Treat Not Completed: Patient at procedure or test/ unavailable. Pt currently out of room for test, will check back as schedule allows for evaluation.  Almon Register [id number] 10/29/2015, 12:53 PM

## 2015-10-29 NOTE — Evaluation (Signed)
Speech Language Pathology Evaluation Patient Details Name: Allison [name] MRN: [id number] DOB: 1934/05/20 Today's Date: 10/29/2015 Time: [id number] SLP Time Calculation (min) (ACUTE ONLY): 18 min  Problem List:  Patient Active Problem List   Diagnosis Date Noted  . Visual hallucinations 10/28/2015  . Stroke-like symptoms 10/28/2015  . CVA (cerebral vascular accident) ([place]) 10/28/2015  . Ischemic stroke ([place])   . Diabetes mellitus with complication ([place])   . Hx of adenomatous colonic polyps 06/19/2014  . Neuropathic pain 03/27/2014  . Uncontrolled type 2 diabetes with neuropathy ([place]) 03/27/2014  . Bilateral carpal tunnel syndrome 03/27/2014  . AKI (acute kidney injury) ([place]) 08/05/2013  . DM type 2, uncontrolled, with renal complications ([place]) 0000000  . Hypercalcemia 08/05/2013  . PVD (peripheral vascular disease) ([place]) 03/05/2012  . Onychomycosis 03/05/2012  . Other hammer toe (acquired) 03/05/2012  . Abnormal CXR 12/25/2011  . Hypothyroidism 06/23/2006  . Dyslipidemia 06/23/2006  . Essential hypertension 06/23/2006  . GERD 06/23/2006  . IBS 06/23/2006   Past Medical History:  Past Medical History:  Diagnosis Date  . Carpal tunnel syndrome   . Diabetes mellitus without complication ([place])   . Dysphagia   . GERD (gastroesophageal reflux disease)   . Hiatal hernia   . HLD (hyperlipidemia)   . Hx of adenomatous colonic polyps 06/19/2014  . Hypertension   . Hypothyroidism   . Onychomycosis 03/05/2012  . Pain in lower limb 04/18/2013  . Sciatic nerve pain   . Vertigo    Past Surgical History:  Past Surgical History:  Procedure Laterality Date  . BLADDER SURGERY     Mesh implant  . COLONOSCOPY    . LARYNX SURGERY     vocal cords  . PARTIAL HYSTERECTOMY  1978  . VESICOVAGINAL FISTULA CLOSURE W/ TAH  1978   HPI:  81 year old female with a history of diabetes mellitus, hyperlipidemia, hypertension, hypothyroidism, medical noncompliance presented with sudden onset  of headache and visual hallucinations on 10/28/2015. In addition, the patient's sister states that the patient has had some slurred speech and gait instability for the past week. Sister states that the patient has been "tripping over her right foot". The patient reports seeing her mother in the kitchen. [name] died in 04-26-09. The hallucinations persisted. As a result, the sister decided to bring the patient to the emergency department. CT of the brain showed possible right internal capsule infarct involving the right caudate nucleus. As a result, the patient was admitted for a full stroke workup and neurology was consulted to assist with management.  Of note, patient with recent admission to the emergency department on 10/26/2015 with complaints of dizziness. The patient was found to have serum glucose of 600 withnormal anion gap. The patient was discharged home from the emergency department in stable condition.   Assessment / Plan / Recommendation Clinical Impression  Cognitive-linguistic evaluation complete.  Patient demonstrates impaired sustained attention versus decreased arousal due to fatigue, which resulted in inability to complete the [facility] Cognitive Assessment (MOCA, 8.1).  Patient disoriented to date (off by 2 days) and was unable to initially store 5 given words.  However, with extra reps was able to store 2 words, but following a brief ~1 minute delay she was unable to recall.  Patient required freqent verbal and tactile cuing to maintain arousal and sustain attention to tasks; she reported that she was tired from not sleeping last night.  Expressive and receptive language appear in tact.  Intelligibility was mildly reduce, which SLP suspects was also related  to arousal versus true cognitive deficits.  Recommend acute follow up to further differentiate and make follow up recommendations.       SLP Assessment  Patient needs continued Speech Lanaguage Pathology Services    Follow Up  Recommendations  Other (comment) (TBD)    Frequency and Duration min 2x/week  1 week      SLP Evaluation Cognition  Overall Cognitive Status: Impaired/Different from baseline Arousal/Alertness: Lethargic Orientation Level: Disoriented to time;Oriented to person;Oriented to place;Oriented to situation (stated date was the 15th) Attention: Sustained Sustained Attention: Impaired Sustained Attention Impairment: Verbal basic;Functional basic Memory: Impaired Memory Impairment: Storage deficit (due to arousal vs. impaired sustained attention ) Awareness: Impaired Awareness Impairment: Emergent impairment Problem Solving: Impaired Problem Solving Impairment: Functional basic Executive Function:  (all impaired due to lower level deficits) Safety/Judgment: Impaired       Comprehension  Auditory Comprehension Overall Auditory Comprehension: Appears within functional limits for tasks assessed    Expression Expression Primary Mode of Expression: Verbal Verbal Expression Overall Verbal Expression: Appears within functional limits for tasks assessed Written Expression Dominant Hand: Right Written Expression: Within Functional Limits   Oral / Motor  Oral Motor/Sensory Function Overall Oral Motor/Sensory Function: Generalized oral weakness Motor Speech Overall Motor Speech: Appears within functional limits for tasks assessed Respiration: Within functional limits Phonation: Low vocal intensity Resonance: Within functional limits Articulation: Within functional limitis Intelligibility: Intelligibility reduced Word: 75-100% accurate Phrase: 50-74% accurate Motor Speech Errors: Not applicable Effective Techniques: Increased vocal intensity   GO                   Carmelia Roller., CCC-SLP [phone]  Joanthony Hamza 10/29/2015, 10:03 AM

## 2015-10-29 NOTE — Progress Notes (Signed)
*  PRELIMINARY RESULTS* Vascular Ultrasound Carotid Duplex (Doppler) has been completed.   Findings suggest 1-39% internal carotid artery stenosis bilaterally. Vertebral arteries are patent with antegrade flow.  10/29/2015 1:13 PM Maudry Mayhew, BS, RVT, RDCS, RDMS

## 2015-10-29 NOTE — Progress Notes (Signed)
PROGRESS NOTE  Allison [name] [id number] DOB: 1934/06/18 DOA: 10/28/2015 PCP: Gildardo Cranker, DO  Brief History:  81 year old female with a history of diabetes mellitus, hyperlipidemia, hypertension, hypothyroidism, medical noncompliance presented with sudden onset of headache and visual hallucinations on 10/28/2015.  In addition, the patient's sister states that the patient has had some slurred speech and gait instability for the past week. Sister states that the patient has been "tripping over her right foot".  The patient reports seeing her mother in the kitchen.  Her mother died in 05/09/09. The hallucinations persisted.  As a result,  the sister decided to bring the patient to the emergency department.  In the emergency department, the patient was afebrile and hemodynamically stable. CT of the brain showed possible right internal capsule infarct involving the right caudate nucleus.  As a result, the patient was admitted for a full stroke workup and neurology was consulted to assist with management.  Notably, the patient wasn't emergency department on 10/26/2015 with complaints of dizziness. The patient was found to have serum glucose of 600 with normal anion gap.  The patient was discharged home from the emergency department in stable condition.  Assessment/Plan: Acute Ischemic Stroke -Appreciate Neurology Consult -PT/OT evaluation -Speech therapy eval -CT brain--acute Right IC infarct -MRI brain--acute right internal capsule lacunar infarct -MRA brain--no large vessel occlusion, severe distal right vertebral artery stenosis, moderate basal artery stenosis -Carotid Duplex--pending -Echo--pending -LDL--213 -HbA1C--pending -Antiplatelet--Plavix 75 mg daily  Diabetes mellitus type 2, uncontrolled -10/28/2015 hemoglobin A1c 11.2 -Hold oral agents -NovoLog sliding scale -Patient will need to go home on insulin -??itching to previous lantus per patient but tolerating  novolog -start 70/30 insulin   Visual hallucinations -EEG unremarkable -?uncontrolled DM -Discontinue gabapentin -TSH 1.885 -Serum B12 630 -UA neg for pyuria  Hypertension -Permissive hypertension in the setting of stroke -holding amlodipine, losartan, chlorthalidone -BP remains well controlled  Hypothyroidism -continue synthroid -TSH 1.885  Hyperlipidemia -continue lipitor -LDL 213, but pt noncompliant with meds    Disposition Plan:   Home in 1-2 days (lives with son) Family Communication:   Son updated at bedside--Total time spent 35 minutes.  Greater than 50% spent face to face counseling and coordinating care.   Consultants:  Neurology  Code Status:  FULL  DVT Prophylaxis:   Alpha Lovenox   Procedures: As Listed in Progress Note Above  Antibiotics: None    Subjective: Patient denies fevers, chills, headache, chest pain, dyspnea, nausea, vomiting, diarrhea, abdominal pain, dysuria, hematuria, hematochezia, and melena. Pt states speech is better but remains unsteady on her feet.  Objective: Vitals:   10/29/15 0030 10/29/15 0230 10/29/15 0429 10/29/15 0951  BP: 114/69 123/65 129/67 (!) 108/59  Pulse: 85 81 86 87  Resp: 16 16 16 18   Temp:  98.5 F (36.9 C) 97.8 F (36.6 C) 97.7 F (36.5 C)  TempSrc:  Oral Oral Oral  SpO2: 99% 100% 98% 99%  Weight:      Height:        Intake/Output Summary (Last 24 hours) at 10/29/15 1047 Last data filed at 10/29/15 0900  Gross per 24 hour  Intake              240 ml  Output                0 ml  Net              240 ml   Weight change:  Exam:  General:  Pt is alert, follows commands appropriately, not in acute distress  HEENT: No icterus, No thrush, No neck mass, [place]/AT  Cardiovascular: RRR, S1/S2, no rubs, no gallops  Respiratory: CTA bilaterally, no wheezing, no crackles, no rhonchi  Abdomen: Soft/+BS, non tender, non distended, no guarding  Extremities: No edema, No lymphangitis, No petechiae, No  rashes, no synovitis   Data Reviewed: I have personally reviewed following labs and imaging studies Basic Metabolic Panel:  Recent Labs Lab 10/26/15 2157 10/28/15 0029  NA 133* 135  K 4.1 3.5  CL 94* 98*  CO2 30 27  GLUCOSE 600* 293*  BUN 12 9  CREATININE 1.16* 0.77  CALCIUM 9.8 9.9   Liver Function Tests: No results for input(s): AST, ALT, ALKPHOS, BILITOT, PROT, ALBUMIN in the last 168 hours. No results for input(s): LIPASE, AMYLASE in the last 168 hours. No results for input(s): AMMONIA in the last 168 hours. Coagulation Profile: No results for input(s): INR, PROTIME in the last 168 hours. CBC:  Recent Labs Lab 10/26/15 2157 10/28/15 0029  WBC 7.7 10.1  NEUTROABS 4.2  --   HGB 13.9 13.1  HCT 40.8 38.5  MCV 80.2 79.4  PLT 248 252   Cardiac Enzymes: No results for input(s): CKTOTAL, CKMB, CKMBINDEX, TROPONINI in the last 168 hours. BNP: Invalid input(s): POCBNP CBG:  Recent Labs Lab 10/28/15 1005 10/28/15 1410 10/28/15 1723 10/28/15 2247 10/29/15 0638  GLUCAP 295* 244* 218* 314* 242*   HbA1C:  Recent Labs  10/28/15 1042  HGBA1C 11.2*   Urine analysis:    Component Value Date/Time   COLORURINE AMBER (A) 10/28/2015 0021   APPEARANCEUR CLEAR 10/28/2015 0021   LABSPEC 1.020 10/28/2015 0021   PHURINE 7.5 10/28/2015 0021   GLUCOSEU >1000 (A) 10/28/2015 0021   HGBUR NEGATIVE 10/28/2015 0021   BILIRUBINUR NEGATIVE 10/28/2015 0021   KETONESUR NEGATIVE 10/28/2015 0021   PROTEINUR NEGATIVE 10/28/2015 0021   UROBILINOGEN 0.2 03/29/2014 1055   NITRITE NEGATIVE 10/28/2015 0021   LEUKOCYTESUR SMALL (A) 10/28/2015 0021   Sepsis Labs: @LABRCNTIP (procalcitonin:4,lacticidven:4) )No results found for this or any previous visit (from the past 240 hour(s)).   Scheduled Meds: . atorvastatin  40 mg Oral QHS  . clopidogrel  75 mg Oral Daily  . enoxaparin (LOVENOX) injection  40 mg Subcutaneous Q24H  . insulin aspart  0-15 Units Subcutaneous TID WC  .  insulin aspart  0-5 Units Subcutaneous QHS  . insulin aspart protamine- aspart  6 Units Subcutaneous BID WC  . levothyroxine  150 mcg Oral QAC breakfast  . pantoprazole  40 mg Oral Daily  . vitamin E  1,000 Units Oral Daily   Continuous Infusions:   Procedures/Studies: Dg Chest 2 View  Result Date: 10/28/2015 CLINICAL DATA:  Patient with history of CVA.  Confusion. EXAM: CHEST  2 VIEW COMPARISON:  Chest radiograph 10/22/2012. FINDINGS: Normal cardiac and mediastinal contours. No consolidative pulmonary opacities. No pleural effusion or pneumothorax. Mid thoracic spine degenerative changes. IMPRESSION: No active cardiopulmonary disease. Electronically Signed   By: Lovey Newcomer M.D.   On: 10/28/2015 12:21   Ct Head Wo Contrast  Result Date: 10/28/2015 CLINICAL DATA:  81 year old female with history of headache for the past 3 months. Recent hallucinations. EXAM: CT HEAD WITHOUT CONTRAST TECHNIQUE: Contiguous axial images were obtained from the base of the skull through the vertex without intravenous contrast. COMPARISON:  Head CT 06/06/2003. FINDINGS: Brain: Relatively well-defined area of low attenuation involving imaging of the right internal capsule as well as the  posterior aspect of the head of the right caudate nucleus, new compared to the prior examination; this is relatively well-defined, favored to represent an old lacunar infarct, however, lateral aspect of the lesion is slightly indistinct (best appreciated on image 12 of series 201). Patchy and confluent areas of decreased attenuation are noted throughout the deep and periventricular white matter of the cerebral hemispheres bilaterally, compatible with chronic microvascular ischemic disease. No acute intracranial abnormalities. Specifically, no acute intracranial hemorrhage, no mass, mass effect, hydrocephalus or abnormal intra or extra-axial fluid collections. Vascular: No hyperdense vessel or unexpected calcification. Skull: Normal.  Negative for fracture or focal lesion. Sinuses/Orbits: No acute finding. Other: None. IMPRESSION: 1. New infarct involving the new right genu of the internal capsule and posterior aspect of the head of the right caudate nucleus. This is predominantly well-defined and very low-attenuation, suggesting that this is been present for a while and is likely old, however, the lateral borders of the lesion are slightly indistinct. The possibility of a late subacute infarction is not excluded. 2. Mild chronic microvascular ischemic changes in the cerebral white matter. These results were called by telephone at the time of interpretation on 10/28/2015 at 8:16 am to [place], who verbally acknowledged these results. Electronically Signed   By: Vinnie Langton M.D.   On: 10/28/2015 08:19   Mr Brain Wo Contrast  Result Date: 10/28/2015 CLINICAL DATA:  81 year old female with headache and recent hallucinations. Right internal capsule lacunar infarct which is new compared to 2005 on head CT today. Initial encounter. EXAM: MRI HEAD WITHOUT CONTRAST MRA HEAD WITHOUT CONTRAST TECHNIQUE: Multiplanar, multiecho pulse sequences of the brain and surrounding structures were obtained without intravenous contrast. Angiographic images of the head were obtained using MRA technique without contrast. COMPARISON:  Head CT 0801 hours today. FINDINGS: MRI HEAD FINDINGS Brain: 1.7 cm of confluent restricted diffusion along the right genu and posterior limb internal capsule and tracking to the right cauda thalamic groove corresponding to the noncontrast head CT finding today. Associated T2 and FLAIR hyperintensity. Fairly isointense T1 appearance of the lesion. No associated hemorrhage. No significant mass effect. No other restricted diffusion or acute infarction. No midline shift, mass effect, evidence of mass lesion, ventriculomegaly, extra-axial collection or acute intracranial hemorrhage. Cervicomedullary junction and pituitary are  within normal limits. Scattered nonspecific cerebral white matter T2 and FLAIR hyperintensity in both hemispheres. Central T2 hyperintensity in the pons, mild. No cortical encephalomalacia. No chronic cerebral blood products identified. Outside of the acute findings, the deep gray matter nuclei are largely unremarkable. Vascular: Appear normal. Skull and upper cervical spine: Widespread cervical spine chronic disc and endplate degeneration. Multilevel mild degenerative cervical spinal stenosis suspected. Normal bone marrow signal. Sinuses/Orbits: Visualized paranasal sinuses and mastoids are stable and well pneumatized. Normal orbits soft tissues. Negative scalp soft tissues. Other: Visible internal auditory structures appear normal. MRA HEAD FINDINGS Antegrade flow in the posterior circulation. However, there is moderate to severe distal right vertebral artery stenosis beyond the right PICA origin. The distal left vertebral artery has a more normal appearance. The left AICA appears dominant. There is moderate basilar artery irregularity and stenosis, worst in the distal basilar segment between the left AICA and SCA origins (series 7, image 53). Both SCA origins remain patent. The right PCA origin is patent. Fetal type left PCA. The right posterior communicating artery also is present. There is tandem moderate severe irregularity and stenosis in the right P1 and P2 segments. There is preserved distal right PCA flow.  There is only mild left PCA irregularity and stenosis. Antegrade flow in both ICA siphons. There is moderate irregularity in both cavernous ICA segments and in the left supraclinoid ICA. There is mild to moderate stenosis in the distal left ICA just proximal to the terminus (series 703, image 7). No other hemodynamically significant siphon stenosis identified. Probable atherosclerotic pseudo lesion of the posterior aspect of the distal vertical petrous segment on the right (image 10). Patent bilateral  MCA and ACA origins. Moderate irregularity and stenosis at the right MCA origin. Mild irregularity and stenosis in the right M1 segment. Early left MCA branching. No left MCA branch occlusion identified. Mild to moderate left ACA origin stenosis. Anterior communicating artery and visualized ACA branches are within normal limits. IMPRESSION: 1. Acute right internal capsule lacunar infarct, corresponding to that on the CT today. No associated hemorrhage or mass effect. 2. Negative for emergent large vessel occlusion. Extensive intracranial atherosclerosis. Up to severe stenosis of the proximal right PCA which could be associated with the acute infarct in #1 (right velum striate artery territory). 3. Severe distal right vertebral artery stenosis. Up to moderate stenoses also in the basilar artery, left ICA supraclinoid segment, left MCA origin, left ACA origin. 4. Comparatively mild for age signal changes elsewhere in the cerebral white matter and brainstem compatible with underlying chronic small vessel disease. Electronically Signed   By: Genevie Ann M.D.   On: 10/28/2015 16:06   Mr Jodene Nam Head/brain [id number] Cm  Result Date: 10/28/2015 CLINICAL DATA:  81 year old female with headache and recent hallucinations. Right internal capsule lacunar infarct which is new compared to 2005 on head CT today. Initial encounter. EXAM: MRI HEAD WITHOUT CONTRAST MRA HEAD WITHOUT CONTRAST TECHNIQUE: Multiplanar, multiecho pulse sequences of the brain and surrounding structures were obtained without intravenous contrast. Angiographic images of the head were obtained using MRA technique without contrast. COMPARISON:  Head CT 0801 hours today. FINDINGS: MRI HEAD FINDINGS Brain: 1.7 cm of confluent restricted diffusion along the right genu and posterior limb internal capsule and tracking to the right cauda thalamic groove corresponding to the noncontrast head CT finding today. Associated T2 and FLAIR hyperintensity. Fairly isointense T1  appearance of the lesion. No associated hemorrhage. No significant mass effect. No other restricted diffusion or acute infarction. No midline shift, mass effect, evidence of mass lesion, ventriculomegaly, extra-axial collection or acute intracranial hemorrhage. Cervicomedullary junction and pituitary are within normal limits. Scattered nonspecific cerebral white matter T2 and FLAIR hyperintensity in both hemispheres. Central T2 hyperintensity in the pons, mild. No cortical encephalomalacia. No chronic cerebral blood products identified. Outside of the acute findings, the deep gray matter nuclei are largely unremarkable. Vascular: Appear normal. Skull and upper cervical spine: Widespread cervical spine chronic disc and endplate degeneration. Multilevel mild degenerative cervical spinal stenosis suspected. Normal bone marrow signal. Sinuses/Orbits: Visualized paranasal sinuses and mastoids are stable and well pneumatized. Normal orbits soft tissues. Negative scalp soft tissues. Other: Visible internal auditory structures appear normal. MRA HEAD FINDINGS Antegrade flow in the posterior circulation. However, there is moderate to severe distal right vertebral artery stenosis beyond the right PICA origin. The distal left vertebral artery has a more normal appearance. The left AICA appears dominant. There is moderate basilar artery irregularity and stenosis, worst in the distal basilar segment between the left AICA and SCA origins (series 7, image 53). Both SCA origins remain patent. The right PCA origin is patent. Fetal type left PCA. The right posterior communicating artery also is present. There is tandem  moderate severe irregularity and stenosis in the right P1 and P2 segments. There is preserved distal right PCA flow. There is only mild left PCA irregularity and stenosis. Antegrade flow in both ICA siphons. There is moderate irregularity in both cavernous ICA segments and in the left supraclinoid ICA. There is mild to  moderate stenosis in the distal left ICA just proximal to the terminus (series 703, image 7). No other hemodynamically significant siphon stenosis identified. Probable atherosclerotic pseudo lesion of the posterior aspect of the distal vertical petrous segment on the right (image 10). Patent bilateral MCA and ACA origins. Moderate irregularity and stenosis at the right MCA origin. Mild irregularity and stenosis in the right M1 segment. Early left MCA branching. No left MCA branch occlusion identified. Mild to moderate left ACA origin stenosis. Anterior communicating artery and visualized ACA branches are within normal limits. IMPRESSION: 1. Acute right internal capsule lacunar infarct, corresponding to that on the CT today. No associated hemorrhage or mass effect. 2. Negative for emergent large vessel occlusion. Extensive intracranial atherosclerosis. Up to severe stenosis of the proximal right PCA which could be associated with the acute infarct in #1 (right velum striate artery territory). 3. Severe distal right vertebral artery stenosis. Up to moderate stenoses also in the basilar artery, left ICA supraclinoid segment, left MCA origin, left ACA origin. 4. Comparatively mild for age signal changes elsewhere in the cerebral white matter and brainstem compatible with underlying chronic small vessel disease. Electronically Signed   By: Genevie Ann M.D.   On: 10/28/2015 16:06    Kue Fox, DO  [facility] Pager [phone]  If 7PM-7AM, please contact night-coverage [email or web address] Password [email or web address] 10/29/2015, 10:47 AM   LOS: 1 day

## 2015-10-30 ENCOUNTER — Other Ambulatory Visit (HOSPITAL_COMMUNITY): Payer: Medicare Other

## 2015-10-30 DIAGNOSIS — I739 Peripheral vascular disease, unspecified: Secondary | ICD-10-CM

## 2015-10-30 DIAGNOSIS — I6300 Cerebral infarction due to thrombosis of unspecified precerebral artery: Secondary | ICD-10-CM

## 2015-10-30 DIAGNOSIS — E1121 Type 2 diabetes mellitus with diabetic nephropathy: Secondary | ICD-10-CM

## 2015-10-30 LAB — GLUCOSE, CAPILLARY
GLUCOSE-CAPILLARY: 151 mg/dL — AB (ref 65–99)
Glucose-Capillary: 155 mg/dL — ABNORMAL HIGH (ref 65–99)
Glucose-Capillary: 234 mg/dL — ABNORMAL HIGH (ref 65–99)
Glucose-Capillary: 208 mg/dL — ABNORMAL HIGH (ref 65–99)

## 2015-10-30 LAB — CBC
HCT: 38.8 % (ref 36.0–46.0)
Hemoglobin: 12.9 g/dL (ref 12.0–15.0)
MCH: 26.3 pg (ref 26.0–34.0)
MCHC: 33.2 g/dL (ref 30.0–36.0)
MCV: 79 fL (ref 78.0–100.0)
Platelets: 242 10*3/uL (ref 150–400)
RBC: 4.91 MIL/uL (ref 3.87–5.11)
RDW: 13.7 % (ref 11.5–15.5)
WBC: 10.1 10*3/uL (ref 4.0–10.5)

## 2015-10-30 LAB — BASIC METABOLIC PANEL
Anion gap: 9 (ref 5–15)
BUN: 11 mg/dL (ref 6–20)
CALCIUM: 9.8 mg/dL (ref 8.9–10.3)
CHLORIDE: 103 mmol/L (ref 101–111)
CO2: 26 mmol/L (ref 22–32)
CREATININE: 0.96 mg/dL (ref 0.44–1.00)
GFR calc Af Amer: >60 mL/min (ref >60)
GFR calc non Af Amer: 55 mL/min — ABNORMAL LOW (ref >60)
Glucose, Bld: 130 mg/dL — ABNORMAL HIGH (ref 65–99)
Potassium: 3.3 mmol/L — ABNORMAL LOW (ref 3.5–5.1)
SODIUM: 138 mmol/L (ref 135–145)

## 2015-10-30 MED ORDER — WHITE PETROLATUM GEL
Status: AC
  Filled 2015-10-30: qty 1

## 2015-10-30 NOTE — Progress Notes (Signed)
Inpatient Diabetes Program Recommendations  AACE/ADA: New Consensus Statement on Inpatient Glycemic Control (2015)  Target Ranges:  Prepandial:   less than 140 mg/dL      Peak postprandial:   less than 180 mg/dL (1-2 hours)      Critically ill patients:  140 - 180 mg/dL   Lab Results  Component Value Date   GLUCAP 155 (H) 10/30/2015   HGBA1C 11.2 (H) 10/28/2015                                                LATE ENTRY Review of Glycemic Control Met with pt, son and family members regarding insulin administration. Pt prefers insulin pen for use at home. Demonstrated how insulin pen works, importance of rotating sites, storage of pens, glucose monitoring, hypoglycemia s/s and treatment. Pt has good support system with son and extended family. States she has given herself insulin with vial and syringe in the past. Also discussed importance of diet, exercise and stress management for controlling blood sugars. Pt to f/u with PCP and take sugar log for MD to make any needed adjustments. Blood sugars excellent this am. Discussed HgbA1C results and goals. Pt voiced understanding. Inpatient Diabetes Program Recommendations:    Will f/u with insulin pen and allow pt/son to demonstrate. Will need insulin pen needles along with insulin pens at discharge.  Thank you.  , RD, LDN, CDE Inpatient Diabetes Coordinator [phone]    

## 2015-10-30 NOTE — Consult Note (Signed)
[facility] CM Primary Care Navigator  10/30/2015  Allison [name] May 11, 1934 [id number]  Met with patient at the bedside to identify possible discharge needs. Patient shared having headaches, drooling from mouth, confusion and visual hallucination which led to this admission. She endorses Gildardo Cranker, DO with [facility] as the primary care provider.    Patient shared using [facility] in [place] to obtain medications without difficulty. She states managing her own medicines at home using "pill box" system.   Patient verbalized being able to drive prior to admission. Sister Marzella Schlein) and 2 sons Dellis Filbert and [name]) are able to provide transportation to her doctors' appointments when needed. Her sister and son [name]) lives with patient and are going to be her primary caregivers at home as stated.    Patient's recent A1c is 11.2 with elevated blood sugar results. Patient mentioned daily monitoring and recording of blood sugars at home. She shared being on oral medications/ insulin for diabetes. She states "there's not much food that I don't like to eat and not eating what I am suppose to".  Inpatient DM coordinator started to see patient to assist with diabetes education and management. DM educational materials at bedside were provided by diabetes coordinator according to patient      Patient voiced understanding to call primary care provider's office for a post discharge follow-up appointment within a week or sooner if needs arise, once she discharge home. Patient letter provided as a reminder.  For additional questions please contact:  Edwena Felty A. Sabriyah Wilcher, BSN, RN-BC [facility] PRIMARY CARE Navigator Cell: [phone]

## 2015-10-30 NOTE — Progress Notes (Signed)
PROGRESS NOTE    Allison [name]  WUX:[id number] DOB: [birth year] DOA: 10/28/2015 PCP: Gildardo Cranker, DO   Brief Narrative:  81 year old female with a history of diabetes mellitus, hyperlipidemia, hypertension, hypothyroidism, medical noncompliance presented with sudden onset of headache and visual hallucinations on 10/28/2015.  In addition, the patient's sister states that the patient has had some slurred speech and gait instability for the past week. Sister states that the patient has been "tripping over her right foot".  The patient reports seeing her mother in the kitchen.  Her mother died in March 30, 2009. The hallucinations persisted.  As a result,  the sister decided to bring the patient to the emergency department.  In the emergency department, the patient was afebrile and hemodynamically stable. CT of the brain showed possible right internal capsule infarct involving the right caudate nucleus.  As a result, the patient was admitted for a full stroke workup and neurology was consulted to assist with management.  Notably, the patient wasn't emergency department on 10/26/2015 with complaints of dizziness. The patient was found to have serum glucose of 600 with normal anion gap.  The patient was discharged home from the emergency department in stable condition.  Patient underwent CVA workup.  Official results of carotid ultrasound pending.  Echocardiogram pending.    Assessment & Plan:   Principal Problem:   CVA (cerebral vascular accident) ([place]) Active Problems:   Hypothyroidism   Dyslipidemia   Essential hypertension   GERD   PVD (peripheral vascular disease) ([place])   DM type 2, uncontrolled, with renal complications (HCC)   Visual hallucinations   Hallucinations   Uncontrolled type 2 diabetes mellitus with hyperglycemia, with long-term current use of insulin (HCC)   Acute ischemic stroke ([place])   Acute Ischemic Stroke -Appreciate Neurology Consult -PT/OT evaluation -Speech therapy  eval -CT brain--acute Right IC infarct -MRI brain--acute right internal capsule lacunar infarct -MRA brain--no large vessel occlusion, severe distal right vertebral artery stenosis, moderate basal artery stenosis -Carotid Duplex--pending -Echo--pending -LDL--213 -HgA1C-- 11.2 -Antiplatelet--Plavix 75 mg daily  Diabetes mellitus type 2, uncontrolled -10/28/2015 hemoglobin A1c 11.2 -Hold oral agents -NovoLog sliding scale -Patient will need to go home on insulin -started 70/30 insulin  - patient requested insulin pens  - appreciate diabetes coordinator input  Visual hallucinations -EEG unremarkable - uncontrolled DM -Discontinue gabapentin -TSH 1.885 -Serum B12 630 -UA neg for pyuria  Hypertension -Permissive hypertension in the setting of stroke -holding amlodipine, losartan, chlorthalidone -BP remains well controlled  Hypothyroidism -continue synthroid -TSH 1.885  Hyperlipidemia -continue lipitor -LDL 213, but pt noncompliant with meds    Disposition Plan:   Home in 1-2 days (lives with son) Family Communication: no family bedside Code Status:  FULL DVT Prophylaxis:   [place] Lovenox    Consultants:   Neurology  Procedures:   None  Antimicrobials:   none    Subjective: Patient was laying in bed talking with hospital personal.  She voices that she feels this was a wake up call to needing to take better care of her health.  She understands that she no longer can just take her medications once a week or even less frequently.  She says she is ready to make big changes in terms of medication management as well as compliance.  Has not gotten echocardiogram yet.  She says she has been eating well, drinking well and no chest pain, chest pressure, shortness of breath.  No abdominal pain.  Normal bowel movements and urination. Patient voices that she wants to have  a solid plan for discharge.  Objective: Vitals:   10/29/15 2139 10/30/15 0238 10/30/15 0652  10/30/15 1021  BP: 113/65 (!) 120/56 117/72 (!) 102/59  Pulse: 82 81 86 70  Resp: '18 19 20 20  ' Temp: 98.2 F (36.8 C) 97.3 F (36.3 C) 98 F (36.7 C) 98.2 F (36.8 C)  TempSrc: Oral Oral Oral Oral  SpO2: 99% 100% 100% 100%  Weight:      Height:        Intake/Output Summary (Last 24 hours) at 10/30/15 1433 Last data filed at 10/29/15 1532  Gross per 24 hour  Intake             1000 ml  Output                0 ml  Net             1000 ml   Filed Weights   10/28/15 1630  Weight: 64.8 kg (142 lb 14.4 oz)    Examination:  General exam: Appears calm and comfortable  Respiratory system: Clear to auscultation. Respiratory effort normal. Cardiovascular system: S1 & S2 heard, RRR. No JVD, murmurs, rubs, gallops or clicks. No pedal edema. Gastrointestinal system: Abdomen is nondistended, soft and nontender. No organomegaly or masses felt. Normal bowel sounds heard. Central nervous system: Alert and oriented. No focal neurological deficits. Extremities: Symmetric 5 x 5 power. Skin: No rashes, lesions or ulcers Psychiatry: Judgement and insight appear normal. Mood & affect appropriate.     Data Reviewed: I have personally reviewed following labs and imaging studies  CBC:  Recent Labs Lab 10/26/15 2157 10/28/15 0029 10/28/15 1213 10/30/15 0428  WBC 7.7 10.1  --  10.1  NEUTROABS 4.2  --   --   --   HGB 13.9 13.1  --  12.9  HCT 40.8 38.5 41.2 38.8  MCV 80.2 79.4  --  79.0  PLT 248 252  --  449   Basic Metabolic Panel:  Recent Labs Lab 10/26/15 2157 10/28/15 0029 10/30/15 0428  NA 133* 135 138  K 4.1 3.5 3.3*  CL 94* 98* 103  CO2 '30 27 26  ' GLUCOSE 600* 293* 130*  BUN '12 9 11  ' CREATININE 1.16* 0.77 0.96  CALCIUM 9.8 9.9 9.8   GFR: Estimated Creatinine Clearance: 41.7 mL/min (by C-G formula based on SCr of 0.96 mg/dL). Liver Function Tests: No results for input(s): AST, ALT, ALKPHOS, BILITOT, PROT, ALBUMIN in the last 168 hours. No results for input(s):  LIPASE, AMYLASE in the last 168 hours. No results for input(s): AMMONIA in the last 168 hours. Coagulation Profile: No results for input(s): INR, PROTIME in the last 168 hours. Cardiac Enzymes: No results for input(s): CKTOTAL, CKMB, CKMBINDEX, TROPONINI in the last 168 hours. BNP (last 3 results) No results for input(s): PROBNP in the last 8760 hours. HbA1C:  Recent Labs  10/28/15 1042  HGBA1C 11.2*   CBG:  Recent Labs Lab 10/29/15 1120 10/29/15 1618 10/29/15 2131 10/30/15 0650 10/30/15 1138  GLUCAP 261* 272* 150* 155* 234*   Lipid Profile:  Recent Labs  10/29/15 0352  CHOL 290*  HDL 41  LDLCALC 213*  TRIG 180*  CHOLHDL 7.1   Thyroid Function Tests:  Recent Labs  10/28/15 0721  TSH 1.885   Anemia Panel:  Recent Labs  10/28/15 0721  VITAMINB12 630   Sepsis Labs: No results for input(s): PROCALCITON, LATICACIDVEN in the last 168 hours.  No results found for this or any previous visit (  from the past 240 hour(s)).       Radiology Studies: Mr Brain 57 Contrast  Result Date: 10/28/2015 CLINICAL DATA:  81 year old female with headache and recent hallucinations. Right internal capsule lacunar infarct which is new compared to 2005 on head CT today. Initial encounter. EXAM: MRI HEAD WITHOUT CONTRAST MRA HEAD WITHOUT CONTRAST TECHNIQUE: Multiplanar, multiecho pulse sequences of the brain and surrounding structures were obtained without intravenous contrast. Angiographic images of the head were obtained using MRA technique without contrast. COMPARISON:  Head CT 0801 hours today. FINDINGS: MRI HEAD FINDINGS Brain: 1.7 cm of confluent restricted diffusion along the right genu and posterior limb internal capsule and tracking to the right cauda thalamic groove corresponding to the noncontrast head CT finding today. Associated T2 and FLAIR hyperintensity. Fairly isointense T1 appearance of the lesion. No associated hemorrhage. No significant mass effect. No other  restricted diffusion or acute infarction. No midline shift, mass effect, evidence of mass lesion, ventriculomegaly, extra-axial collection or acute intracranial hemorrhage. Cervicomedullary junction and pituitary are within normal limits. Scattered nonspecific cerebral white matter T2 and FLAIR hyperintensity in both hemispheres. Central T2 hyperintensity in the pons, mild. No cortical encephalomalacia. No chronic cerebral blood products identified. Outside of the acute findings, the deep gray matter nuclei are largely unremarkable. Vascular: Appear normal. Skull and upper cervical spine: Widespread cervical spine chronic disc and endplate degeneration. Multilevel mild degenerative cervical spinal stenosis suspected. Normal bone marrow signal. Sinuses/Orbits: Visualized paranasal sinuses and mastoids are stable and well pneumatized. Normal orbits soft tissues. Negative scalp soft tissues. Other: Visible internal auditory structures appear normal. MRA HEAD FINDINGS Antegrade flow in the posterior circulation. However, there is moderate to severe distal right vertebral artery stenosis beyond the right PICA origin. The distal left vertebral artery has a more normal appearance. The left AICA appears dominant. There is moderate basilar artery irregularity and stenosis, worst in the distal basilar segment between the left AICA and SCA origins (series 7, image 53). Both SCA origins remain patent. The right PCA origin is patent. Fetal type left PCA. The right posterior communicating artery also is present. There is tandem moderate severe irregularity and stenosis in the right P1 and P2 segments. There is preserved distal right PCA flow. There is only mild left PCA irregularity and stenosis. Antegrade flow in both ICA siphons. There is moderate irregularity in both cavernous ICA segments and in the left supraclinoid ICA. There is mild to moderate stenosis in the distal left ICA just proximal to the terminus (series 703,  image 7). No other hemodynamically significant siphon stenosis identified. Probable atherosclerotic pseudo lesion of the posterior aspect of the distal vertical petrous segment on the right (image 10). Patent bilateral MCA and ACA origins. Moderate irregularity and stenosis at the right MCA origin. Mild irregularity and stenosis in the right M1 segment. Early left MCA branching. No left MCA branch occlusion identified. Mild to moderate left ACA origin stenosis. Anterior communicating artery and visualized ACA branches are within normal limits. IMPRESSION: 1. Acute right internal capsule lacunar infarct, corresponding to that on the CT today. No associated hemorrhage or mass effect. 2. Negative for emergent large vessel occlusion. Extensive intracranial atherosclerosis. Up to severe stenosis of the proximal right PCA which could be associated with the acute infarct in #1 (right velum striate artery territory). 3. Severe distal right vertebral artery stenosis. Up to moderate stenoses also in the basilar artery, left ICA supraclinoid segment, left MCA origin, left ACA origin. 4. Comparatively mild for age signal changes  elsewhere in the cerebral white matter and brainstem compatible with underlying chronic small vessel disease. Electronically Signed   By: Genevie Ann M.D.   On: 10/28/2015 16:06   Mr Jodene Nam Head/brain JI Cm  Result Date: 10/28/2015 CLINICAL DATA:  81 year old female with headache and recent hallucinations. Right internal capsule lacunar infarct which is new compared to 2005 on head CT today. Initial encounter. EXAM: MRI HEAD WITHOUT CONTRAST MRA HEAD WITHOUT CONTRAST TECHNIQUE: Multiplanar, multiecho pulse sequences of the brain and surrounding structures were obtained without intravenous contrast. Angiographic images of the head were obtained using MRA technique without contrast. COMPARISON:  Head CT 0801 hours today. FINDINGS: MRI HEAD FINDINGS Brain: 1.7 cm of confluent restricted diffusion along the  right genu and posterior limb internal capsule and tracking to the right cauda thalamic groove corresponding to the noncontrast head CT finding today. Associated T2 and FLAIR hyperintensity. Fairly isointense T1 appearance of the lesion. No associated hemorrhage. No significant mass effect. No other restricted diffusion or acute infarction. No midline shift, mass effect, evidence of mass lesion, ventriculomegaly, extra-axial collection or acute intracranial hemorrhage. Cervicomedullary junction and pituitary are within normal limits. Scattered nonspecific cerebral white matter T2 and FLAIR hyperintensity in both hemispheres. Central T2 hyperintensity in the pons, mild. No cortical encephalomalacia. No chronic cerebral blood products identified. Outside of the acute findings, the deep gray matter nuclei are largely unremarkable. Vascular: Appear normal. Skull and upper cervical spine: Widespread cervical spine chronic disc and endplate degeneration. Multilevel mild degenerative cervical spinal stenosis suspected. Normal bone marrow signal. Sinuses/Orbits: Visualized paranasal sinuses and mastoids are stable and well pneumatized. Normal orbits soft tissues. Negative scalp soft tissues. Other: Visible internal auditory structures appear normal. MRA HEAD FINDINGS Antegrade flow in the posterior circulation. However, there is moderate to severe distal right vertebral artery stenosis beyond the right PICA origin. The distal left vertebral artery has a more normal appearance. The left AICA appears dominant. There is moderate basilar artery irregularity and stenosis, worst in the distal basilar segment between the left AICA and SCA origins (series 7, image 53). Both SCA origins remain patent. The right PCA origin is patent. Fetal type left PCA. The right posterior communicating artery also is present. There is tandem moderate severe irregularity and stenosis in the right P1 and P2 segments. There is preserved distal right  PCA flow. There is only mild left PCA irregularity and stenosis. Antegrade flow in both ICA siphons. There is moderate irregularity in both cavernous ICA segments and in the left supraclinoid ICA. There is mild to moderate stenosis in the distal left ICA just proximal to the terminus (series 703, image 7). No other hemodynamically significant siphon stenosis identified. Probable atherosclerotic pseudo lesion of the posterior aspect of the distal vertical petrous segment on the right (image 10). Patent bilateral MCA and ACA origins. Moderate irregularity and stenosis at the right MCA origin. Mild irregularity and stenosis in the right M1 segment. Early left MCA branching. No left MCA branch occlusion identified. Mild to moderate left ACA origin stenosis. Anterior communicating artery and visualized ACA branches are within normal limits. IMPRESSION: 1. Acute right internal capsule lacunar infarct, corresponding to that on the CT today. No associated hemorrhage or mass effect. 2. Negative for emergent large vessel occlusion. Extensive intracranial atherosclerosis. Up to severe stenosis of the proximal right PCA which could be associated with the acute infarct in #1 (right velum striate artery territory). 3. Severe distal right vertebral artery stenosis. Up to moderate stenoses also in the  basilar artery, left ICA supraclinoid segment, left MCA origin, left ACA origin. 4. Comparatively mild for age signal changes elsewhere in the cerebral white matter and brainstem compatible with underlying chronic small vessel disease. Electronically Signed   By: Genevie Ann M.D.   On: 10/28/2015 16:06        Scheduled Meds: . white petrolatum      . atorvastatin  40 mg Oral QHS  . clopidogrel  75 mg Oral Daily  . enoxaparin (LOVENOX) injection  40 mg Subcutaneous Q24H  . insulin aspart  0-15 Units Subcutaneous TID WC  . insulin aspart  0-5 Units Subcutaneous QHS  . insulin aspart protamine- aspart  6 Units Subcutaneous BID  WC  . insulin starter kit- pen needles  1 kit Other Once  . levothyroxine  150 mcg Oral QAC breakfast  . pantoprazole  40 mg Oral Daily  . vitamin E  1,000 Units Oral Daily   Continuous Infusions:    LOS: 2 days    Time spent: 35 minutes    Newman Pies, MD [facility] Pager [phone]  If 7PM-7AM, please contact night-coverage [email or web address] Password [email or web address] 10/30/2015, 2:33 PM

## 2015-10-30 NOTE — Progress Notes (Signed)
STROKE TEAM PROGRESS NOTE   SUBJECTIVE (INTERVAL HISTORY) No one is at the bedside.  She states he mind is clear. She thinks she is in the hospital, she knows the date.    OBJECTIVE Temp:  [97.3 F (36.3 C)-98.2 F (36.8 C)] 98 F (36.7 C) (10/18 0652) Pulse Rate:  [81-102] 86 (10/18 0652) Cardiac Rhythm: Normal sinus rhythm (10/17 1900) Resp:  [18-20] 20 (10/18 0652) BP: (76-137)/(41-72) 117/72 (10/18 0652) SpO2:  [99 %-100 %] 100 % (10/18 0652)  CBC:   Recent Labs Lab 10/26/15 2157 10/28/15 0029 10/28/15 1213 10/30/15 0428  WBC 7.7 10.1  --  10.1  NEUTROABS 4.2  --   --   --   HGB 13.9 13.1  --  12.9  HCT 40.8 38.5 41.2 38.8  MCV 80.2 79.4  --  79.0  PLT 248 252  --  [id number]    Basic Metabolic Panel:   Recent Labs Lab 10/28/15 0029 10/30/15 0428  NA 135 138  K 3.5 3.3*  CL 98* 103  CO2 27 26  GLUCOSE 293* 130*  BUN 9 11  CREATININE 0.77 0.96  CALCIUM 9.9 9.8    Lipid Panel:     Component Value Date/Time   CHOL 290 (H) 10/29/2015 0352   CHOL 273 (H) 05/22/2015 0845   TRIG 180 (H) 10/29/2015 0352   HDL 41 10/29/2015 0352   HDL 54 05/22/2015 0845   CHOLHDL 7.1 10/29/2015 0352   VLDL 36 10/29/2015 0352   LDLCALC 213 (H) 10/29/2015 0352   LDLCALC 198 (H) 05/22/2015 0845   HgbA1c:  Lab Results  Component Value Date   HGBA1C 11.2 (H) 10/28/2015   Urine Drug Screen: No results found for: LABOPIA, COCAINSCRNUR, LABBENZ, AMPHETMU, THCU, LABBARB    IMAGING  Dg Chest 2 View  Result Date: 10/28/2015 CLINICAL DATA:  Patient with history of CVA.  Confusion. EXAM: CHEST  2 VIEW COMPARISON:  Chest radiograph 10/22/2012. FINDINGS: Normal cardiac and mediastinal contours. No consolidative pulmonary opacities. No pleural effusion or pneumothorax. Mid thoracic spine degenerative changes. IMPRESSION: No active cardiopulmonary disease. Electronically Signed   By: Lovey Newcomer M.D.   On: 10/28/2015 12:21   Mr Brain Wo Contrast  Result Date: 10/28/2015 CLINICAL  DATA:  81 year old female with headache and recent hallucinations. Right internal capsule lacunar infarct which is new compared to 2005 on head CT today. Initial encounter. EXAM: MRI HEAD WITHOUT CONTRAST MRA HEAD WITHOUT CONTRAST TECHNIQUE: Multiplanar, multiecho pulse sequences of the brain and surrounding structures were obtained without intravenous contrast. Angiographic images of the head were obtained using MRA technique without contrast. COMPARISON:  Head CT 0801 hours today. FINDINGS: MRI HEAD FINDINGS Brain: 1.7 cm of confluent restricted diffusion along the right genu and posterior limb internal capsule and tracking to the right cauda thalamic groove corresponding to the noncontrast head CT finding today. Associated T2 and FLAIR hyperintensity. Fairly isointense T1 appearance of the lesion. No associated hemorrhage. No significant mass effect. No other restricted diffusion or acute infarction. No midline shift, mass effect, evidence of mass lesion, ventriculomegaly, extra-axial collection or acute intracranial hemorrhage. Cervicomedullary junction and pituitary are within normal limits. Scattered nonspecific cerebral white matter T2 and FLAIR hyperintensity in both hemispheres. Central T2 hyperintensity in the pons, mild. No cortical encephalomalacia. No chronic cerebral blood products identified. Outside of the acute findings, the deep gray matter nuclei are largely unremarkable. Vascular: Appear normal. Skull and upper cervical spine: Widespread cervical spine chronic disc and endplate degeneration. Multilevel mild degenerative  cervical spinal stenosis suspected. Normal bone marrow signal. Sinuses/Orbits: Visualized paranasal sinuses and mastoids are stable and well pneumatized. Normal orbits soft tissues. Negative scalp soft tissues. Other: Visible internal auditory structures appear normal. MRA HEAD FINDINGS Antegrade flow in the posterior circulation. However, there is moderate to severe distal right  vertebral artery stenosis beyond the right PICA origin. The distal left vertebral artery has a more normal appearance. The left AICA appears dominant. There is moderate basilar artery irregularity and stenosis, worst in the distal basilar segment between the left AICA and SCA origins (series 7, image 53). Both SCA origins remain patent. The right PCA origin is patent. Fetal type left PCA. The right posterior communicating artery also is present. There is tandem moderate severe irregularity and stenosis in the right P1 and P2 segments. There is preserved distal right PCA flow. There is only mild left PCA irregularity and stenosis. Antegrade flow in both ICA siphons. There is moderate irregularity in both cavernous ICA segments and in the left supraclinoid ICA. There is mild to moderate stenosis in the distal left ICA just proximal to the terminus (series 703, image 7). No other hemodynamically significant siphon stenosis identified. Probable atherosclerotic pseudo lesion of the posterior aspect of the distal vertical petrous segment on the right (image 10). Patent bilateral MCA and ACA origins. Moderate irregularity and stenosis at the right MCA origin. Mild irregularity and stenosis in the right M1 segment. Early left MCA branching. No left MCA branch occlusion identified. Mild to moderate left ACA origin stenosis. Anterior communicating artery and visualized ACA branches are within normal limits. IMPRESSION: 1. Acute right internal capsule lacunar infarct, corresponding to that on the CT today. No associated hemorrhage or mass effect. 2. Negative for emergent large vessel occlusion. Extensive intracranial atherosclerosis. Up to severe stenosis of the proximal right PCA which could be associated with the acute infarct in #1 (right velum striate artery territory). 3. Severe distal right vertebral artery stenosis. Up to moderate stenoses also in the basilar artery, left ICA supraclinoid segment, left MCA origin, left  ACA origin. 4. Comparatively mild for age signal changes elsewhere in the cerebral white matter and brainstem compatible with underlying chronic small vessel disease. Electronically Signed   By: Genevie Ann M.D.   On: 10/28/2015 16:06   Mr Jodene Nam Head/brain [id number] Cm  Result Date: 10/28/2015 CLINICAL DATA:  81 year old female with headache and recent hallucinations. Right internal capsule lacunar infarct which is new compared to 2005 on head CT today. Initial encounter. EXAM: MRI HEAD WITHOUT CONTRAST MRA HEAD WITHOUT CONTRAST TECHNIQUE: Multiplanar, multiecho pulse sequences of the brain and surrounding structures were obtained without intravenous contrast. Angiographic images of the head were obtained using MRA technique without contrast. COMPARISON:  Head CT 0801 hours today. FINDINGS: MRI HEAD FINDINGS Brain: 1.7 cm of confluent restricted diffusion along the right genu and posterior limb internal capsule and tracking to the right cauda thalamic groove corresponding to the noncontrast head CT finding today. Associated T2 and FLAIR hyperintensity. Fairly isointense T1 appearance of the lesion. No associated hemorrhage. No significant mass effect. No other restricted diffusion or acute infarction. No midline shift, mass effect, evidence of mass lesion, ventriculomegaly, extra-axial collection or acute intracranial hemorrhage. Cervicomedullary junction and pituitary are within normal limits. Scattered nonspecific cerebral white matter T2 and FLAIR hyperintensity in both hemispheres. Central T2 hyperintensity in the pons, mild. No cortical encephalomalacia. No chronic cerebral blood products identified. Outside of the acute findings, the deep gray matter nuclei are largely unremarkable.  Vascular: Appear normal. Skull and upper cervical spine: Widespread cervical spine chronic disc and endplate degeneration. Multilevel mild degenerative cervical spinal stenosis suspected. Normal bone marrow signal. Sinuses/Orbits:  Visualized paranasal sinuses and mastoids are stable and well pneumatized. Normal orbits soft tissues. Negative scalp soft tissues. Other: Visible internal auditory structures appear normal. MRA HEAD FINDINGS Antegrade flow in the posterior circulation. However, there is moderate to severe distal right vertebral artery stenosis beyond the right PICA origin. The distal left vertebral artery has a more normal appearance. The left AICA appears dominant. There is moderate basilar artery irregularity and stenosis, worst in the distal basilar segment between the left AICA and SCA origins (series 7, image 53). Both SCA origins remain patent. The right PCA origin is patent. Fetal type left PCA. The right posterior communicating artery also is present. There is tandem moderate severe irregularity and stenosis in the right P1 and P2 segments. There is preserved distal right PCA flow. There is only mild left PCA irregularity and stenosis. Antegrade flow in both ICA siphons. There is moderate irregularity in both cavernous ICA segments and in the left supraclinoid ICA. There is mild to moderate stenosis in the distal left ICA just proximal to the terminus (series 703, image 7). No other hemodynamically significant siphon stenosis identified. Probable atherosclerotic pseudo lesion of the posterior aspect of the distal vertical petrous segment on the right (image 10). Patent bilateral MCA and ACA origins. Moderate irregularity and stenosis at the right MCA origin. Mild irregularity and stenosis in the right M1 segment. Early left MCA branching. No left MCA branch occlusion identified. Mild to moderate left ACA origin stenosis. Anterior communicating artery and visualized ACA branches are within normal limits. IMPRESSION: 1. Acute right internal capsule lacunar infarct, corresponding to that on the CT today. No associated hemorrhage or mass effect. 2. Negative for emergent large vessel occlusion. Extensive intracranial  atherosclerosis. Up to severe stenosis of the proximal right PCA which could be associated with the acute infarct in #1 (right velum striate artery territory). 3. Severe distal right vertebral artery stenosis. Up to moderate stenoses also in the basilar artery, left ICA supraclinoid segment, left MCA origin, left ACA origin. 4. Comparatively mild for age signal changes elsewhere in the cerebral white matter and brainstem compatible with underlying chronic small vessel disease. Electronically Signed   By: Genevie Ann M.D.   On: 10/28/2015 16:06   Carotid Doppler   There is 1-39% bilateral ICA stenosis. Vertebral artery flow is antegrade.    EEG This is a normal EEG for the patients stated age.  There were no focal, hemispheric or lateralizing features.  No epileptiform activity was recorded.  A normal EEG does not exclude the diagnosis of a seizure disorder and if seizure remains high on the list of differential diagnosis, an ambulatory EEG may be of value.  Clinical correlation is required.   PHYSICAL EXAM Frail elderly african american lady not in distress. . Afebrile. Head is nontraumatic. Neck is supple without bruit.    Cardiac exam no murmur or gallop. Lungs are clear to auscultation. Distal pulses are well felt. Neurological Exam ;  Awake  Alert oriented x 3. Normal speech and language.eye movements full without nystagmus.fundi were not visualized. Vision acuity and fields appear normal. Hearing is normal. Palatal movements are normal. Face symmetric. Tongue midline. Normal strength, tone, reflexes and coordination. Normal sensation. Gait deferred.   ASSESSMENT/PLAN Allison [name] is a 81 y.o. female with history of hypertension and diabetes presenting  with visual hallucinations. She did not receive IV t-PA due to delay in arrival.   Stroke:  Right internal capsule infarct, secondary to small vessel disease  from uncontrolled risk factors  MRI  R internal capsule infarct   MRA  no  emergent large vessel occlusion. Severe stenosis proximal R PCA, R VA. Moderate stenosis BA, L ICA, L MCA, L ACA.  Carotid Doppler  no significant stenosis  2D Echo  Remains pending   EEG No seizure activity  LDL 213  HgbA1c 11.2  Lovenox 40 mg sq daily for VTE prophylaxis Diet heart healthy/carb modified Room service appropriate? Yes; Fluid consistency: Thin  aspirin 81 mg daily prior to admission, changed to clopidogrel 75 mg daily  Patient counseled to be compliant with her antithrombotic medications  Ongoing aggressive stroke risk factor management  Therapy recommendations:  OP PT, OT  Disposition:  Return home with OP therapies  Ok for discharge once stroke workup completed  Follow up with NP at [facility]. Order written.  Hypertension  Stable  Permissive hypertension (OK if < 220/120) but gradually normalize in 5-7 days  Long-term BP goal normotensive  Hyperlipidemia  Home meds:  lipitor 40 - admits to only taking once a week, resumed in hospital  LDL 213, goal < 70  Continue Lipitor daily. Patient educated.   Continue statin at discharge  Diabetes type II  HgbA1c 11.2, goal < 7.0  Uncontrolled  Other Stroke Risk Factors  Advanced age  Other Active Problems  Visual hallucinations  Hypothyroidism  Hospital day # [place] for Pager information 10/30/2015 9:51 AM  I have personally examined this patient, reviewed notes, independently viewed imaging studies, participated in medical decision making and plan of care.ROS completed by me personally and pertinent positives fully documented  I have made any additions or clarifications directly to the above note. Agree with note above. Interesting presentation with hallucinations and confusion without any focal findings but MRI shows scan shows a fairly large 1.5 cm right basal ganglia infarct from small vessel disease.  Await echo  results.  Antony Contras, MD Medical Director [facility] Pager: [phone] 10/30/2015 9:51 AM  To contact Stroke Continuity provider, please refer to [email or web address] After hours, contact General Neurology

## 2015-10-30 NOTE — Progress Notes (Signed)
Speech Language Pathology Treatment: Cognitive-Linquistic  Patient Details Name: Allison [name] MRN: [id number] DOB: Mar 26, 1934 Today's Date: 10/30/2015 Time: [id number] SLP Time Calculation (min) (ACUTE ONLY): 17 min  Assessment / Plan / Recommendation Clinical Impression  Cognitive intervention this afternoon with son present with whom she lives in addition to her daughter-in-law. Session focused on problem solving and anticipatory awareness once home. Pt acknowledged that she needs to take all her meds, not just the ones "I want to take." Educated pt to utilize her pill box at home and have son check for accuracy. For memory pt states she uses her calendar and writes important appointments. SLP encouraged her to continue to use written information for recall and place in visible space. Pt has family support at home. Discharge from [place] services.    HPI HPI: 81 year old female with a history of diabetes mellitus, hyperlipidemia, hypertension, hypothyroidism, medical noncompliance presented with sudden onset of headache and visual hallucinations on 10/28/2015. In addition, the patient's sister states that the patient has had some slurred speech and gait instability for the past week. Sister states that the patient has been "tripping over her right foot". The patient reports seeing her mother in the kitchen. [name] died in 04-21-2009. The hallucinations persisted. As a result, the sister decided to bring the patient to the emergency department. CT of the brain showed possible right internal capsule infarct involving the right caudate nucleus. As a result, the patient was admitted for a full stroke workup and neurology was consulted to assist with management.  Of note, patient with recent admission to the emergency department on 10/26/2015 with complaints of dizziness. The patient was found to have serum glucose of 600 withnormal anion gap. The patient was discharged home from the emergency  department in stable condition.      SLP Plan  Continue with current plan of care     Recommendations                   Oral Care Recommendations: Oral care BID Follow up Recommendations: None (due to good family support) Plan: Continue with current plan of care       GO                Allison [name] 10/30/2015, 3:54 PM  Allison [name].Ed [employer] [phone]

## 2015-10-31 ENCOUNTER — Inpatient Hospital Stay (HOSPITAL_COMMUNITY): Payer: Medicare Other

## 2015-10-31 DIAGNOSIS — I63019 Cerebral infarction due to thrombosis of unspecified vertebral artery: Secondary | ICD-10-CM

## 2015-10-31 LAB — VAS US CAROTID
LCCADDIAS: 16 cm/s
LEFT ECA DIAS: -7 cm/s
LEFT VERTEBRAL DIAS: 8 cm/s
LICADDIAS: -18 cm/s
LICADSYS: -72 cm/s
LICAPDIAS: -17 cm/s
LICAPSYS: -70 cm/s
Left CCA dist sys: 79 cm/s
Left CCA prox dias: 13 cm/s
Left CCA prox sys: 83 cm/s
RIGHT ECA DIAS: -1 cm/s
RIGHT VERTEBRAL DIAS: 7 cm/s
Right CCA prox dias: 11 cm/s
Right CCA prox sys: 78 cm/s
Right cca dist sys: -81 cm/s

## 2015-10-31 LAB — GLUCOSE, CAPILLARY
GLUCOSE-CAPILLARY: 211 mg/dL — AB (ref 65–99)
GLUCOSE-CAPILLARY: 203 mg/dL — AB (ref 65–99)
Glucose-Capillary: 230 mg/dL — ABNORMAL HIGH (ref 65–99)
Glucose-Capillary: 273 mg/dL — ABNORMAL HIGH (ref 65–99)

## 2015-10-31 LAB — ECHOCARDIOGRAM COMPLETE
HEIGHTINCHES: 64 in
WEIGHTICAEL: 2286.4 [oz_av]

## 2015-10-31 MED ORDER — PERFLUTREN LIPID MICROSPHERE
INTRAVENOUS | Status: AC
  Administered 2015-10-31: 1 mL via INTRAVENOUS
  Filled 2015-10-31: qty 10

## 2015-10-31 MED ORDER — PERFLUTREN LIPID MICROSPHERE
1.0000 mL | INTRAVENOUS | Status: AC | PRN
  Administered 2015-10-31: 1 mL via INTRAVENOUS
  Filled 2015-10-31: qty 10

## 2015-10-31 NOTE — Progress Notes (Signed)
Occupational Therapy Treatment Patient Details Name: Allison [name] MRN: [id number] DOB: 10-19-1934 Today's Date: 10/31/2015    History of present illness  Allison [name] is a very pleasant 81 y.o. female with medical history significant for diabetes, hyperlipidemia, hypertension, hypothyroidism, GERD, noncompliance presents emergency Department chief complaint mid and visual hallucinations. MRI:  Acute right internal capsule lacunar infarct   OT comments  Pt making progress toward OT goals this session. Able to perform tub transfer with supervision for safety. Pt able to participate in visual scanning/attention activity in therapy gym with min verbal cues. Continue to recommend outpatient OT for follow up. Will continue to follow acutely.   Follow Up Recommendations  Outpatient OT    Equipment Recommendations  None recommended by OT    Recommendations for Other Services      Precautions / Restrictions Precautions Precautions: Fall Restrictions Weight Bearing Restrictions: No       Mobility Bed Mobility Overal bed mobility: Modified Independent             General bed mobility comments: Pt sitting EOB upon arrival.  Transfers Overall transfer level: Needs assistance Equipment used: None Transfers: Sit to/from Stand Sit to Stand: Modified independent (Device/Increase time)         General transfer comment: No LOB or unsteadiness noted in standing.    Balance Overall balance assessment: No apparent balance deficits (not formally assessed)                                 ADL Overall ADL's : Needs assistance/impaired                                 Tub/ Shower Transfer: Supervision/safety;Tub transfer;Ambulation;Grab bars;Shower Scientist, research (medical) Details (indicate cue type and reason): Pt able to perform tub transfer without physical assist. Pt reports she typically sits in bottom of tub and takes a bath. Pt able to sit in  bottom of tub and get back up. Requried seated rest break and cues for hand placement. Functional mobility during ADLs: Supervision/safety General ADL Comments: Pt able to perfom visual scanning/attention activity in therapy gym with min verabl cues. Continue to feel outpt OT would be appropriate for cognitive and visual deficits; pt and son verbalized understanding. Educated pt and son on home safety and fall prevention strategies.      Vision                     Perception     Praxis      Cognition   Behavior During Therapy: [facility] for tasks assessed/performed Overall Cognitive Status: Impaired/Different from baseline Area of Impairment: Memory     Memory: Decreased short-term memory          General Comments: After stating pt having tests to be done prior to discharge, asks if she is going home at end of session. Not able to recall discussion had in beginning of session initated by patient.    Extremity/Trunk Assessment               Exercises     Shoulder Instructions       General Comments      Pertinent Vitals/ Pain       Pain Assessment: No/denies pain  Home Living  Prior Functioning/Environment              Frequency  Min 2X/week        Progress Toward Goals  OT Goals(current goals can now be found in the care plan section)  Progress towards OT goals: Progressing toward goals  Acute Rehab OT Goals Patient Stated Goal: home OT Goal Formulation: With patient  Plan Discharge plan remains appropriate    Co-evaluation                 End of Session     Activity Tolerance Patient tolerated treatment well   Patient Left in bed;with call bell/phone within reach;with family/visitor present   Nurse Communication          Time: 1052-1110 OT Time Calculation (min): 18 min  Charges: OT General Charges $OT Visit: 1 Procedure OT Treatments $Self Care/Home  Management : 8-22 mins  Binnie Kand M.S., OTR/L Pager: [phone]  10/31/2015, 11:18 AM

## 2015-10-31 NOTE — Progress Notes (Signed)
STROKE TEAM PROGRESS NOTE   SUBJECTIVE (INTERVAL HISTORY) No family present. Awaiting 2D result. No new complaints.   OBJECTIVE Temp:  [97.7 F (36.5 C)-98.2 F (36.8 C)] 97.9 F (36.6 C) (10/19 0924) Pulse Rate:  [68-97] 97 (10/19 0924) Cardiac Rhythm: Normal sinus rhythm (10/18 2300) Resp:  [18-20] 20 (10/19 0924) BP: (102-139)/(59-76) 117/69 (10/19 0924) SpO2:  [97 %-100 %] 100 % (10/19 0924)  CBC:   Recent Labs Lab 10/26/15 2157 10/28/15 0029 10/28/15 1213 10/30/15 0428  WBC 7.7 10.1  --  10.1  NEUTROABS 4.2  --   --   --   HGB 13.9 13.1  --  12.9  HCT 40.8 38.5 41.2 38.8  MCV 80.2 79.4  --  79.0  PLT 248 252  --  [id number]    Basic Metabolic Panel:   Recent Labs Lab 10/28/15 0029 10/30/15 0428  NA 135 138  K 3.5 3.3*  CL 98* 103  CO2 27 26  GLUCOSE 293* 130*  BUN 9 11  CREATININE 0.77 0.96  CALCIUM 9.9 9.8     IMAGING  No results found.  2D Echocardiogram  pending    PHYSICAL EXAM Frail elderly african american lady not in distress. . Afebrile. Head is nontraumatic. Neck is supple without bruit.    Cardiac exam no murmur or gallop. Lungs are clear to auscultation. Distal pulses are well felt. Neurological Exam ;  Awake  Alert oriented x 3. Normal speech and language.eye movements full without nystagmus.fundi were not visualized. Vision acuity and fields appear normal. Hearing is normal. Palatal movements are normal. Face symmetric. Tongue midline. Normal strength, tone, reflexes and coordination. Normal sensation. Gait deferred.   ASSESSMENT/PLAN Ms. Allison [name] is a 81 y.o. female with history of hypertension and diabetes presenting with visual hallucinations. She did not receive IV t-PA due to delay in arrival.   Stroke:  Right internal capsule infarct, secondary to small vessel disease  from uncontrolled risk factors  MRI  R internal capsule infarct   MRA  no emergent large vessel occlusion. Severe stenosis proximal R PCA, R VA. Moderate  stenosis BA, L ICA, L MCA, L ACA.  Carotid Doppler  no significant stenosis  2D Echo  Remains pending   EEG No seizure activity  LDL 213  HgbA1c 11.2  Lovenox 40 mg sq daily for VTE prophylaxis Diet heart healthy/carb modified Room service appropriate? Yes; Fluid consistency: Thin  aspirin 81 mg daily prior to admission, changed to clopidogrel 75 mg daily  Patient counseled to be compliant with her antithrombotic medications  Ongoing aggressive stroke risk factor management  Therapy recommendations:  OP PT, OT  Disposition:  Return home with OP therapies  Ok for discharge once stroke workup completed  Follow up with NP at [facility]. Order in place  Hypertension  normal Long-term BP goal normotensive  Hyperlipidemia  Home meds:  lipitor 40 - admits to only taking once a week, resumed in hospital  LDL 213, goal < 70  Continue Lipitor daily. Patient educated.   Continue statin at discharge  Diabetes type II  HgbA1c 11.2, goal < 7.0  Uncontrolled  Other Stroke Risk Factors  Advanced age  Other Active Problems  Visual hallucinations  Hypothyroidism  Hospital day # [place] for Pager information 10/31/2015 9:34 AM  I have personally examined this patient, reviewed notes, independently viewed imaging studies, participated in medical decision making and plan of care.ROS completed by me  personally and pertinent positives fully documented  I have made any additions or clarifications directly to the above note.  Recommendations: After echocardiogram results. Follow-up as outpatient in stroke clinic in 6 weeks. Stroke team will sign off. Kindly call for questions. Antony Contras, MD Medical Director [facility] Pager: [phone] 10/31/2015 2:54 PM    To contact Stroke Continuity provider, please refer to [email or web address] After hours, contact General Neurology

## 2015-10-31 NOTE — Progress Notes (Signed)
Physical Therapy Treatment Patient Details Name: Allison [name] MRN: [id number] DOB: [birth year] Today's Date: 10/31/2015    History of Present Illness  Allison [name] is a very pleasant 81 y.o. female with medical history significant for diabetes, hyperlipidemia, hypertension, hypothyroidism, GERD, noncompliance presents emergency Department chief complaint mid and visual hallucinations. MRI:  Acute right internal capsule lacunar infarct    PT Comments    Patient progressing well with mobility. Improved DGI score to 20/24 from previous session. Tolerated stair training with use of rails for support. Education with pt and son about signs/symptoms of CVA. Pt can still benefit from higher level balance training at [place]. Pt does not require further skilled therapy in acute setting as pt functioning close to baseline. Recommend ambulation multiple times per day with family. All goals met and education completed. Discharge from therapy.   Follow Up Recommendations  Outpatient PT;Supervision/Assistance - 24 hour     Equipment Recommendations  None recommended by PT    Recommendations for Other Services       Precautions / Restrictions Precautions Precautions: Fall Restrictions Weight Bearing Restrictions: No    Mobility  Bed Mobility Overal bed mobility: Modified Independent             General bed mobility comments: HOB flat, no use of rails to simulate home. Initiated long sitting to get to EOB.  Transfers Overall transfer level: Needs assistance Equipment used: None Transfers: Sit to/from Stand Sit to Stand: Modified independent (Device/Increase time)         General transfer comment: Stood from EOB without assist. NO dizziness.   Ambulation/Gait Ambulation/Gait assistance: Modified independent (Device/Increase time) Ambulation Distance (Feet): 300 Feet Assistive device: None Gait Pattern/deviations: Step-through pattern;Decreased stride length Gait velocity:  slow Gait velocity interpretation: Below normal speed for age/gender General Gait Details: Slow, steady gait. No evidence of LLE weakness/instability.    Stairs Stairs: Yes Stairs assistance: Modified independent (Device/Increase time) Stair Management: Two rails;Alternating pattern Number of Stairs: 3 (+ 2 steps x2 bouts) General stair comments: Used rails for support. Discussed having son present when negotiating steps at home due to not having a rail.  Wheelchair Mobility    Modified Rankin (Stroke Patients Only)       Balance                                    Cognition Arousal/Alertness: Awake/alert Behavior During Therapy: WFL for tasks assessed/performed Overall Cognitive Status: Impaired/Different from baseline Area of Impairment: Memory     Memory: Decreased short-term memory (may be baseline)         General Comments: After stating pt having tests to be done prior to discharge, asks if she is going home at end of session. Not able to recall discussion had in beginning of session initated by patient.    Exercises      General Comments        Pertinent Vitals/Pain Pain Assessment: No/denies pain    Home Living                      Prior Function            PT Goals (current goals can now be found in the care plan section) Progress towards PT goals: Goals met/education completed, patient discharged from PT    Frequency    Min 3X/week      PT Plan Current  plan remains appropriate    Co-evaluation             End of Session Equipment Utilized During Treatment: Gait belt Activity Tolerance: Patient tolerated treatment well Patient left: in bed;with call bell/phone within reach;with family/visitor present     Time: 0931-0951 PT Time Calculation (min) (ACUTE ONLY): 20 min  Charges:  $Neuromuscular Re-education: 8-22 mins                    G Codes:       A  10/31/2015, 10:07 AM    , PT, DPT [phone]     

## 2015-10-31 NOTE — Care Management Note (Signed)
Case Management Note  Patient Details  Name: Allison [name] MRN: [id number] Date of Birth: July 14, 1934  Subjective/Objective:   Pt admitted with CVA. She is from home with family.  Action/Plan: PT/OT recommending outpatient therapy. CM following for d/c needs.   Expected Discharge Date:   (Pending)               Expected Discharge Plan:  Home/Self Care  In-House Referral:     Discharge planning Services     Post Acute Care Choice:    Choice offered to:     DME Arranged:    DME Agency:     HH Arranged:    HH Agency:     Status of Service:  In process, will continue to follow  If discussed at Long Length of Stay Meetings, dates discussed:    Additional Comments:  [name], RN 10/31/2015, 10:58 AM

## 2015-10-31 NOTE — Progress Notes (Signed)
PROGRESS NOTE    Allison [name]  ZSM:[id number] DOB: [birth year] DOA: 10/28/2015 PCP: Gildardo Cranker, DO   Brief Narrative:  81 year old female with a history of diabetes mellitus, hyperlipidemia, hypertension, hypothyroidism, medical noncompliance presented with sudden onset of headache and visual hallucinations on 10/28/2015.  In addition, the patient's sister states that the patient has had some slurred speech and gait instability for the past week. Sister states that the patient has been "tripping over her right foot".  The patient reports seeing her mother in the kitchen.  Her mother died in March 12, 2009. The hallucinations persisted.  As a result,  the sister decided to bring the patient to the emergency department.  In the emergency department, the patient was afebrile and hemodynamically stable. CT of the brain showed possible right internal capsule infarct involving the right caudate nucleus.  As a result, the patient was admitted for a full stroke workup and neurology was consulted to assist with management.  Notably, the patient wasn't emergency department on 10/26/2015 with complaints of dizziness. The patient was found to have serum glucose of 600 with normal anion gap.  The patient was discharged home from the emergency department in stable condition.  Patient underwent CVA workup.  Official results of carotid ultrasound pending.  Echocardiogram pending.    Assessment & Plan:   Principal Problem:   CVA (cerebral vascular accident) ([place]) Active Problems:   Hypothyroidism   Dyslipidemia   Essential hypertension   GERD   PVD (peripheral vascular disease) ([place])   DM type 2, uncontrolled, with renal complications (HCC)   Visual hallucinations   Hallucinations   Uncontrolled type 2 diabetes mellitus with hyperglycemia, with long-term current use of insulin (HCC)   Acute ischemic stroke ([place])   Acute Ischemic Stroke -Appreciate Neurology Consult -PT/OT evaluation -Speech therapy  eval -CT brain--acute Right IC infarct -MRI brain--acute right internal capsule lacunar infarct -MRA brain--no large vessel occlusion, severe distal right vertebral artery stenosis, moderate basal artery stenosis -Carotid Duplex--1-39% internal carotid artery stenosis bilaterally. Vertebral arteries are patent with antegrade flow. The right vertebral artery exhibits an atypical waveform -Echo--pending, will be able to discharge when echocardiogram performed and resulted -LDL--213 -HgA1C-- 11.2 -Antiplatelet--Plavix 75 mg daily  Diabetes mellitus type 2, uncontrolled -10/28/2015 hemoglobin A1c 11.2 -Hold oral agents -NovoLog sliding scale -Patient will need to go home on insulin -started 70/30 insulin  - patient requested insulin pens  - appreciate diabetes coordinator input - son educated on how to use insulin pen  Visual hallucinations -EEG unremarkable - uncontrolled DM -Discontinue gabapentin -TSH 1.885 -Serum B12 630 -UA neg for pyuria  Hypertension -Permissive hypertension in the setting of stroke -holding amlodipine, losartan, chlorthalidone -BP remains well controlled  Hypothyroidism -continue synthroid -TSH 1.885  Hyperlipidemia -continue lipitor -LDL 213, but pt noncompliant with meds    Disposition Plan:   Home in 1-2 days (lives with son) Family Communication: son is bedside Code Status:  FULL DVT Prophylaxis:   [place] Lovenox    Consultants:   Neurology  Procedures:   None  Antimicrobials:   none    Subjective: Patient seen and evaluated.  Son is bedside.  Discussed importance of using insulin at home as directed as patient diabetes is not controlled.  Son and patient voice understanding.  Awaiting echocardiogram.  Objective: Vitals:   10/31/15 0629 10/31/15 0924 10/31/15 1346 10/31/15 1727  BP: 136/71 117/69 (!) 108/54 125/75  Pulse: 82 97 94 92  Resp: _0 Temp: 98.1 F (36.7 C)  97.9 F (36.6 C) 98 F (36.7 C) 97.8  F (36.6 C)  TempSrc: Oral Oral Oral Oral  SpO2: 99% 100% 100% 98%  Weight:      Height:        Intake/Output Summary (Last 24 hours) at 10/31/15 1741 Last data filed at 10/31/15 0900  Gross per 24 hour  Intake              600 ml  Output                0 ml  Net              600 ml   Filed Weights   10/28/15 1630  Weight: 64.8 kg (142 lb 14.4 oz)    Examination:  General exam: Appears calm and comfortable  Respiratory system: Clear to auscultation. Respiratory effort normal. Cardiovascular system: S1 & S2 heard, RRR. No JVD, murmurs, rubs, gallops or clicks. No pedal edema. Gastrointestinal system: Abdomen is nondistended, soft and nontender. No organomegaly or masses felt. Normal bowel sounds heard. Central nervous system: Alert and oriented. No focal neurological deficits. Extremities: Symmetric 5 x 5 power. Skin: No rashes, lesions or ulcers Psychiatry: Judgement and insight appear normal. Mood & affect appropriate.     Data Reviewed: I have personally reviewed following labs and imaging studies  CBC:  Recent Labs Lab 10/26/15 2157 10/28/15 0029 10/28/15 1213 10/30/15 0428  WBC 7.7 10.1  --  10.1  NEUTROABS 4.2  --   --   --   HGB 13.9 13.1  --  12.9  HCT 40.8 38.5 41.2 38.8  MCV 80.2 79.4  --  79.0  PLT 248 252  --  242   Basic Metabolic Panel:  Recent Labs Lab 10/26/15 2157 10/28/15 0029 10/30/15 0428  NA 133* 135 138  K 4.1 3.5 3.3*  CL 94* 98* 103  CO2 30 27 26  GLUCOSE 600* 293* 130*  BUN 12 9 11  CREATININE 1.16* 0.77 0.96  CALCIUM 9.8 9.9 9.8   GFR: Estimated Creatinine Clearance: 41.7 mL/min (by C-G formula based on SCr of 0.96 mg/dL). Liver Function Tests: No results for input(s): AST, ALT, ALKPHOS, BILITOT, PROT, ALBUMIN in the last 168 hours. No results for input(s): LIPASE, AMYLASE in the last 168 hours. No results for input(s): AMMONIA in the last 168 hours. Coagulation Profile: No results for input(s): INR, PROTIME in the last  168 hours. Cardiac Enzymes: No results for input(s): CKTOTAL, CKMB, CKMBINDEX, TROPONINI in the last 168 hours. BNP (last 3 results) No results for input(s): PROBNP in the last 8760 hours. HbA1C: No results for input(s): HGBA1C in the last 72 hours. CBG:  Recent Labs Lab 10/30/15 1732 10/30/15 2032 10/31/15 0614 10/31/15 1112 10/31/15 1654  GLUCAP 208* 151* 230* 273* 211*   Lipid Profile:  Recent Labs  10/29/15 0352  CHOL 290*  HDL 41  LDLCALC 213*  TRIG 180*  CHOLHDL 7.1   Thyroid Function Tests: No results for input(s): TSH, T4TOTAL, FREET4, T3FREE, THYROIDAB in the last 72 hours. Anemia Panel: No results for input(s): VITAMINB12, FOLATE, FERRITIN, TIBC, IRON, RETICCTPCT in the last 72 hours. Sepsis Labs: No results for input(s): PROCALCITON, LATICACIDVEN in the last 168 hours.  No results found for this or any previous visit (from the past 240 hour(s)).       Radiology Studies: No results found.      Scheduled Meds: . atorvastatin  40 mg Oral QHS  . clopidogrel  75 mg   Oral Daily  . enoxaparin (LOVENOX) injection  40 mg Subcutaneous Q24H  . insulin aspart  0-15 Units Subcutaneous TID WC  . insulin aspart  0-5 Units Subcutaneous QHS  . insulin aspart protamine- aspart  6 Units Subcutaneous BID WC  . insulin starter kit- pen needles  1 kit Other Once  . levothyroxine  150 mcg Oral QAC breakfast  . pantoprazole  40 mg Oral Daily  . vitamin E  1,000 Units Oral Daily   Continuous Infusions:    LOS: 3 days    Time spent: 35 minutes    Newman Pies, MD [facility] Pager [phone]  If 7PM-7AM, please contact night-coverage [email or web address] Password [email or web address] 10/31/2015, 5:41 PM

## 2015-10-31 NOTE — Progress Notes (Signed)
  Echocardiogram 2D Echocardiogram with Definity has been performed.  Darlina Sicilian M 10/31/2015, 3:43 PM

## 2015-10-31 NOTE — Progress Notes (Signed)
Inpatient Diabetes Program Recommendations  AACE/ADA: New Consensus Statement on Inpatient Glycemic Control (2015)  Target Ranges:  Prepandial:   less than 140 mg/dL      Peak postprandial:   less than 180 mg/dL (1-2 hours)      Critically ill patients:  140 - 180 mg/dL   Lab Results  Component Value Date   GLUCAP 230 (H) 10/31/2015   HGBA1C 11.2 (H) 10/28/2015    Review of Glycemic Control  FBS elevated this am.  Pt will likely eat more food at home than in hospital.   Inpatient Diabetes Program Recommendations:    Consider increasing 70/30 insulin to 7 units bid.   Reviewed insulin pen on 10/18 with pt and son. Answered questions. Pt and son verbalized understanding. Has glucose meter at home.  Will continue to follow. Thank you. Lorenda Peck, RD, LDN, CDE Inpatient Diabetes Coordinator [phone]

## 2015-11-01 LAB — GLUCOSE, CAPILLARY
Glucose-Capillary: 178 mg/dL — ABNORMAL HIGH (ref 65–99)
Glucose-Capillary: 230 mg/dL — ABNORMAL HIGH (ref 65–99)

## 2015-11-01 MED ORDER — CHLORTHALIDONE 25 MG PO TABS: 12.5000 mg | ORAL_TABLET | Freq: Every day | ORAL | 1 refills | Status: DC

## 2015-11-01 MED ORDER — CLOPIDOGREL BISULFATE 75 MG PO TABS: 75.0000 mg | ORAL_TABLET | Freq: Every day | ORAL | 1 refills | Status: DC

## 2015-11-01 MED ORDER — INSULIN ASPART PROT & ASPART (70-30 MIX) 100 UNIT/ML ~~LOC~~ SUSP: 9.0000 [IU] | Freq: Two times a day (BID) | SUBCUTANEOUS | 11 refills | Status: DC

## 2015-11-01 MED ORDER — INSULIN ASPART PROT & ASPART (70-30 MIX) 100 UNIT/ML ~~LOC~~ SUSP: 7.0000 [IU] | Freq: Two times a day (BID) | SUBCUTANEOUS | Status: DC

## 2015-11-01 NOTE — Progress Notes (Signed)
Inpatient Diabetes Program Recommendations  AACE/ADA: New Consensus Statement on Inpatient Glycemic Control (2015)  Target Ranges:  Prepandial:   less than 140 mg/dL      Peak postprandial:   less than 180 mg/dL (1-2 hours)      Critically ill patients:  140 - 180 mg/dL   Lab Results  Component Value Date   GLUCAP 230 (H) 11/01/2015   HGBA1C 11.2 (H) 10/28/2015    Review of Glycemic Control  Reviewed insulin pen administration and pt was able to return demonstration. Son states that family will be with her when she injects insulin - to double-check. Has meter and supplies at home. Instructed to check blood sug   Inpatient Diabetes Program Recommendations:  FOR DISCHARGE  70/30 8 units bid Novolog 0-15 units tidwc for correction insulin Needs prescription for pen needles.  Thank you. Lorenda Peck, RD, LDN, CDE Inpatient Diabetes Coordinator [phone]

## 2015-11-01 NOTE — Care Management Important Message (Signed)
Important Message  Patient Details  Name: Allison [name] MRN: [id number] Date of Birth: March 17, 1934   Medicare Important Message Given:  Yes    Juliana Boling 11/01/2015, 4:08 PM

## 2015-11-01 NOTE — Progress Notes (Signed)
Discharge instructions reviewed with patient/family. All question answered at this time. RX for sharpe container given. Transport by family.   Ave Filter, RN

## 2015-11-01 NOTE — Care Management Note (Signed)
Case Management Note  Patient Details  Name: Allison [name] MRN: [id number] Date of Birth: 1934-07-20  Subjective/Objective:                    Action/Plan: Patient discharging home with orders for [facility] services. CM met with the patient and her son and provided a list of [facility] agencies in the [place] area. She selected [facility]. [name] with [facility] notified and accepted the referral. Pt states she has support at home to assist with her insulin administration. Diabetes coordinator asked to reassess her and sons demonstration of insulin administration with the insulin pen. Benefits check done on novolin 70/30 insulin that came back with a cost of $8.25-$46. Family made aware and states the cost in affordable. Bedside RN updated.  Expected Discharge Date:   (Pending)               Expected Discharge Plan:  Home/Self Care  In-House Referral:     Discharge planning Services  CM Consult  Post Acute Care Choice:  Home Health Choice offered to:  Patient  DME Arranged:    DME Agency:     HH Arranged:  RN, PT, OT HH Agency:  [place]  Status of Service:  Completed, signed off  If discussed at [place] of Stay Meetings, dates discussed:    Additional Comments:  [name], RN 11/01/2015, 2:55 PM

## 2015-11-01 NOTE — Discharge Summary (Signed)
Physician Discharge Summary  Allison [name] [id number] DOB: 11-Jun-1934 DOA: 10/28/2015  PCP: Gildardo Cranker, DO  Admit date: 10/28/2015 Discharge date: 11/01/2015  Admitted From: Home Disposition:   Home  Recommendations for Outpatient Follow-up:  1. Follow up with PCP in 1-2 weeks 2. Please obtain BMP/CBC in one week 3. Please follow up on the following pending results:  Home Health: Yes- PT/OT/RN  Equipment/Devices: None  Discharge Condition: Stable CODE STATUS: Full Code Diet recommendation: Heart Healthy / Carb Modified  Brief/Interim Summary: 81 year old female with a history of diabetes mellitus, hyperlipidemia, hypertension, hypothyroidism, medical noncompliance presented with sudden onset of headache and visual hallucinations on 10/28/2015. In addition, the patient's sister states that the patient has had some slurred speech and gait instability for the past week. Sister states that the patient has been "tripping over her right foot". The patient reports seeing her mother in the kitchen. [name] died in 2009/05/02. The hallucinations persisted. As a result, the sister decided to bring the patient to the emergency department. In the emergency department, the patient was afebrile and hemodynamically stable. CT of the brain showed possible right internal capsule infarct involving the right caudate nucleus. As a result, the patient was admitted for a full stroke workup and neurology was consulted to assist with management.  Notably, the patient wasn't emergency department on 10/26/2015 with complaints of dizziness. The patient was found to have serum glucose of 600 withnormal anion gap. The patient was discharged home from the emergency department in stable condition.  Patient underwent CVA workup.  Official results of carotid ultrasound showed no stenosisand echocardiogram showed The cavity size was normal. There was mild concentric hypertrophy. Systolic function was  normal. The estimated ejection fraction was in the range of 55% to 60%. Wall motion was normal; there were no regional wall motion abnormalities. Doppler parameters are consistent with abnormal left ventricular relaxation (grade 1 diastolic dysfunction).  Discharge Diagnoses:  Principal Problem:   CVA (cerebral vascular accident) ([place]) Active Problems:   Hypothyroidism   Dyslipidemia   Essential hypertension   GERD   PVD (peripheral vascular disease) ([place])   DM type 2, uncontrolled, with renal complications ([facility])   Visual hallucinations   Hallucinations   Uncontrolled type 2 diabetes mellitus with hyperglycemia, with long-term current use of insulin ([place])   Acute ischemic stroke [facility])    Discharge Instructions  Discharge Instructions    Ambulatory referral to Neurology    Complete by:  As directed    Stroke patient. Dr. Leonie Man prefers follow up in 6 weeks. Ok to see NP per Dr. Leonie Man   Call MD for:  difficulty breathing, headache or visual disturbances    Complete by:  As directed    Call MD for:  extreme fatigue    Complete by:  As directed    Call MD for:  persistant dizziness or light-headedness    Complete by:  As directed    Call MD for:  persistant nausea and vomiting    Complete by:  As directed    Call MD for:  severe uncontrolled pain    Complete by:  As directed    Call MD for:  temperature >100.4    Complete by:  As directed    Diet - low sodium heart healthy    Complete by:  As directed    Diet Carb Modified    Complete by:  As directed    Diet Carb Modified    Complete by:  As directed  Increase activity slowly    Complete by:  As directed    Increase activity slowly    Complete by:  As directed        Medication List    STOP taking these medications   amLODipine 10 MG tablet Commonly known as:  NORVASC   aspirin EC 81 MG tablet   gabapentin 100 MG capsule Commonly known as:  NEURONTIN   glipiZIDE 10 MG 24 hr tablet Commonly known as:   GLUCOTROL XL   losartan 100 MG tablet Commonly known as:  COZAAR     TAKE these medications   atorvastatin 40 MG tablet Commonly known as:  LIPITOR take 1 tablet by mouth at bedtime   chlorthalidone 25 MG tablet Commonly known as:  HYGROTON Take 0.5 tablets (12.5 mg total) by mouth daily. What changed:  See the new instructions.   clopidogrel 75 MG tablet Commonly known as:  PLAVIX Take 1 tablet (75 mg total) by mouth daily. Start taking on:  11/02/2015   insulin aspart protamine- aspart (70-30) 100 UNIT/ML injection Commonly known as:  NOVOLOG MIX 70/30 Inject 0.09 mLs (9 Units total) into the skin 2 (two) times daily with a meal.   levothyroxine 150 MCG tablet Commonly known as:  SYNTHROID, LEVOTHROID Take 1 tablet (150 mcg total) by mouth every morning. For thyroid   metFORMIN 500 MG tablet Commonly known as:  GLUCOPHAGE Take 2 tablets (1,000 mg total) by mouth 2 (two) times daily with a meal.   omeprazole 20 MG capsule Commonly known as:  PRILOSEC take 1 capsule by mouth once daily   vitamin E 1000 UNIT capsule Generic drug:  vitamin E Take 1,000 Units by mouth daily.      Follow-up Information    MARTIN,NANCY CAROLYN, NP Follow up in 6 week(s).   Specialty:  Family Medicine Why:  stroke clinic. office will call with appt date and time Contact information: [place] [address] [address], [place], [place]. Schedule an appointment as soon as possible for a visit in 1 week(s).   Specialty:  Internal Medicine Contact information: [place] [address] [phone]          Allergies  Allergen Reactions  . Lantus [Insulin Glargine] Itching and Swelling    angioedema  . Levemir [Insulin Detemir] Itching and Swelling    angioedema  . Pineapple Anaphylaxis  . Codeine Other (See Comments)    unknown  . Invokana [Canagliflozin] Other (See Comments)    Constipation, nightmares  . Penicillins  Other (See Comments)    Unknown   . Tradjenta [Linagliptin] Other (See Comments)  . Vioxx [Rofecoxib] Other (See Comments)    unknown    Consultations:  Neurology  PT/OT  Procedures/Studies: Dg Chest 2 View  Result Date: 10/28/2015 CLINICAL DATA:  Patient with history of CVA.  Confusion. EXAM: CHEST  2 VIEW COMPARISON:  Chest radiograph 10/22/2012. FINDINGS: Normal cardiac and mediastinal contours. No consolidative pulmonary opacities. No pleural effusion or pneumothorax. Mid thoracic spine degenerative changes. IMPRESSION: No active cardiopulmonary disease. Electronically Signed   By: Lovey Newcomer M.D.   On: 10/28/2015 12:21   Ct Head Wo Contrast  Result Date: 10/28/2015 CLINICAL DATA:  81 year old female with history of headache for the past 3 months. Recent hallucinations. EXAM: CT HEAD WITHOUT CONTRAST TECHNIQUE: Contiguous axial images were obtained from the base of the skull through the vertex without intravenous contrast. COMPARISON:  Head  CT 06/06/2003. FINDINGS: Brain: Relatively well-defined area of low attenuation involving imaging of the right internal capsule as well as the posterior aspect of the head of the right caudate nucleus, new compared to the prior examination; this is relatively well-defined, favored to represent an old lacunar infarct, however, lateral aspect of the lesion is slightly indistinct (best appreciated on image 12 of series 201). Patchy and confluent areas of decreased attenuation are noted throughout the deep and periventricular white matter of the cerebral hemispheres bilaterally, compatible with chronic microvascular ischemic disease. No acute intracranial abnormalities. Specifically, no acute intracranial hemorrhage, no mass, mass effect, hydrocephalus or abnormal intra or extra-axial fluid collections. Vascular: No hyperdense vessel or unexpected calcification. Skull: Normal. Negative for fracture or focal lesion. Sinuses/Orbits: No acute finding. Other:  None. IMPRESSION: 1. New infarct involving the new right genu of the internal capsule and posterior aspect of the head of the right caudate nucleus. This is predominantly well-defined and very low-attenuation, suggesting that this is been present for a while and is likely old, however, the lateral borders of the lesion are slightly indistinct. The possibility of a late subacute infarction is not excluded. 2. Mild chronic microvascular ischemic changes in the cerebral white matter. These results were called by telephone at the time of interpretation on 10/28/2015 at 8:16 am to [place], who verbally acknowledged these results. Electronically Signed   By: Vinnie Langton M.D.   On: 10/28/2015 08:19   Mr Brain Wo Contrast  Result Date: 10/28/2015 CLINICAL DATA:  81 year old female with headache and recent hallucinations. Right internal capsule lacunar infarct which is new compared to 2005 on head CT today. Initial encounter. EXAM: MRI HEAD WITHOUT CONTRAST MRA HEAD WITHOUT CONTRAST TECHNIQUE: Multiplanar, multiecho pulse sequences of the brain and surrounding structures were obtained without intravenous contrast. Angiographic images of the head were obtained using MRA technique without contrast. COMPARISON:  Head CT 0801 hours today. FINDINGS: MRI HEAD FINDINGS Brain: 1.7 cm of confluent restricted diffusion along the right genu and posterior limb internal capsule and tracking to the right cauda thalamic groove corresponding to the noncontrast head CT finding today. Associated T2 and FLAIR hyperintensity. Fairly isointense T1 appearance of the lesion. No associated hemorrhage. No significant mass effect. No other restricted diffusion or acute infarction. No midline shift, mass effect, evidence of mass lesion, ventriculomegaly, extra-axial collection or acute intracranial hemorrhage. Cervicomedullary junction and pituitary are within normal limits. Scattered nonspecific cerebral white matter T2 and FLAIR  hyperintensity in both hemispheres. Central T2 hyperintensity in the pons, mild. No cortical encephalomalacia. No chronic cerebral blood products identified. Outside of the acute findings, the deep gray matter nuclei are largely unremarkable. Vascular: Appear normal. Skull and upper cervical spine: Widespread cervical spine chronic disc and endplate degeneration. Multilevel mild degenerative cervical spinal stenosis suspected. Normal bone marrow signal. Sinuses/Orbits: Visualized paranasal sinuses and mastoids are stable and well pneumatized. Normal orbits soft tissues. Negative scalp soft tissues. Other: Visible internal auditory structures appear normal. MRA HEAD FINDINGS Antegrade flow in the posterior circulation. However, there is moderate to severe distal right vertebral artery stenosis beyond the right PICA origin. The distal left vertebral artery has a more normal appearance. The left AICA appears dominant. There is moderate basilar artery irregularity and stenosis, worst in the distal basilar segment between the left AICA and SCA origins (series 7, image 53). Both SCA origins remain patent. The right PCA origin is patent. Fetal type left PCA. The right posterior communicating artery also is present. There  is tandem moderate severe irregularity and stenosis in the right P1 and P2 segments. There is preserved distal right PCA flow. There is only mild left PCA irregularity and stenosis. Antegrade flow in both ICA siphons. There is moderate irregularity in both cavernous ICA segments and in the left supraclinoid ICA. There is mild to moderate stenosis in the distal left ICA just proximal to the terminus (series 703, image 7). No other hemodynamically significant siphon stenosis identified. Probable atherosclerotic pseudo lesion of the posterior aspect of the distal vertical petrous segment on the right (image 10). Patent bilateral MCA and ACA origins. Moderate irregularity and stenosis at the right MCA origin.  Mild irregularity and stenosis in the right M1 segment. Early left MCA branching. No left MCA branch occlusion identified. Mild to moderate left ACA origin stenosis. Anterior communicating artery and visualized ACA branches are within normal limits. IMPRESSION: 1. Acute right internal capsule lacunar infarct, corresponding to that on the CT today. No associated hemorrhage or mass effect. 2. Negative for emergent large vessel occlusion. Extensive intracranial atherosclerosis. Up to severe stenosis of the proximal right PCA which could be associated with the acute infarct in #1 (right velum striate artery territory). 3. Severe distal right vertebral artery stenosis. Up to moderate stenoses also in the basilar artery, left ICA supraclinoid segment, left MCA origin, left ACA origin. 4. Comparatively mild for age signal changes elsewhere in the cerebral white matter and brainstem compatible with underlying chronic small vessel disease. Electronically Signed   By: Genevie Ann M.D.   On: 10/28/2015 16:06   Mr Jodene Nam Head/brain [id number] Cm  Result Date: 10/28/2015 CLINICAL DATA:  81 year old female with headache and recent hallucinations. Right internal capsule lacunar infarct which is new compared to 2005 on head CT today. Initial encounter. EXAM: MRI HEAD WITHOUT CONTRAST MRA HEAD WITHOUT CONTRAST TECHNIQUE: Multiplanar, multiecho pulse sequences of the brain and surrounding structures were obtained without intravenous contrast. Angiographic images of the head were obtained using MRA technique without contrast. COMPARISON:  Head CT 0801 hours today. FINDINGS: MRI HEAD FINDINGS Brain: 1.7 cm of confluent restricted diffusion along the right genu and posterior limb internal capsule and tracking to the right cauda thalamic groove corresponding to the noncontrast head CT finding today. Associated T2 and FLAIR hyperintensity. Fairly isointense T1 appearance of the lesion. No associated hemorrhage. No significant mass effect. No other  restricted diffusion or acute infarction. No midline shift, mass effect, evidence of mass lesion, ventriculomegaly, extra-axial collection or acute intracranial hemorrhage. Cervicomedullary junction and pituitary are within normal limits. Scattered nonspecific cerebral white matter T2 and FLAIR hyperintensity in both hemispheres. Central T2 hyperintensity in the pons, mild. No cortical encephalomalacia. No chronic cerebral blood products identified. Outside of the acute findings, the deep gray matter nuclei are largely unremarkable. Vascular: Appear normal. Skull and upper cervical spine: Widespread cervical spine chronic disc and endplate degeneration. Multilevel mild degenerative cervical spinal stenosis suspected. Normal bone marrow signal. Sinuses/Orbits: Visualized paranasal sinuses and mastoids are stable and well pneumatized. Normal orbits soft tissues. Negative scalp soft tissues. Other: Visible internal auditory structures appear normal. MRA HEAD FINDINGS Antegrade flow in the posterior circulation. However, there is moderate to severe distal right vertebral artery stenosis beyond the right PICA origin. The distal left vertebral artery has a more normal appearance. The left AICA appears dominant. There is moderate basilar artery irregularity and stenosis, worst in the distal basilar segment between the left AICA and SCA origins (series 7, image 53). Both SCA origins remain patent.  The right PCA origin is patent. Fetal type left PCA. The right posterior communicating artery also is present. There is tandem moderate severe irregularity and stenosis in the right P1 and P2 segments. There is preserved distal right PCA flow. There is only mild left PCA irregularity and stenosis. Antegrade flow in both ICA siphons. There is moderate irregularity in both cavernous ICA segments and in the left supraclinoid ICA. There is mild to moderate stenosis in the distal left ICA just proximal to the terminus (series 703,  image 7). No other hemodynamically significant siphon stenosis identified. Probable atherosclerotic pseudo lesion of the posterior aspect of the distal vertical petrous segment on the right (image 10). Patent bilateral MCA and ACA origins. Moderate irregularity and stenosis at the right MCA origin. Mild irregularity and stenosis in the right M1 segment. Early left MCA branching. No left MCA branch occlusion identified. Mild to moderate left ACA origin stenosis. Anterior communicating artery and visualized ACA branches are within normal limits. IMPRESSION: 1. Acute right internal capsule lacunar infarct, corresponding to that on the CT today. No associated hemorrhage or mass effect. 2. Negative for emergent large vessel occlusion. Extensive intracranial atherosclerosis. Up to severe stenosis of the proximal right PCA which could be associated with the acute infarct in #1 (right velum striate artery territory). 3. Severe distal right vertebral artery stenosis. Up to moderate stenoses also in the basilar artery, left ICA supraclinoid segment, left MCA origin, left ACA origin. 4. Comparatively mild for age signal changes elsewhere in the cerebral white matter and brainstem compatible with underlying chronic small vessel disease. Electronically Signed   By: Genevie Ann M.D.   On: 10/28/2015 16:06    Echocardiogram: The cavity size was normal. There was mild concentric hypertrophy. Systolic function was normal. The estimated ejection fraction was in the range of 55% to 60%. Wall motion was normal; there were no regional wall motion abnormalities. Doppler parameters are consistent with abnormal left ventricular relaxation (grade 1 diastolic dysfunction).   Subjective: Patient seen and examined.  Patient states she understands how to give herself insulin and check her blood sugar.  Son has been educated as well and voices he understands how to inject insulin as well.  Patient and her son understand that changes will be  made in patient's medication. Plan to discharge patient with PT/OT and RN.    Discharge Exam: Vitals:   11/01/15 0630 11/01/15 1020  BP: 126/74 130/68  Pulse: 87 88  Resp: 20 20  Temp: 97.9 F (36.6 C) 98.3 F (36.8 C)   Vitals:   10/31/15 2033 11/01/15 0030 11/01/15 0630 11/01/15 1020  BP: 130/63 129/77 126/74 130/68  Pulse: 100 95 87 88  Resp: 20 20 20 20   Temp: 97.7 F (36.5 C) 97.9 F (36.6 C) 97.9 F (36.6 C) 98.3 F (36.8 C)  TempSrc: Oral Oral Oral Oral  SpO2: 100% 100% 100% 100%  Weight:      Height:        General: Pt is alert, awake, not in acute distress Cardiovascular: RRR, S1/S2 +, no rubs, no gallops Respiratory: CTA bilaterally, no wheezing, no rhonchi Abdominal: Soft, NT, ND, bowel sounds + Extremities: no edema, no cyanosis    The results of significant diagnostics from this hospitalization (including imaging, microbiology, ancillary and laboratory) are listed below for reference.     Microbiology: No results found for this or any previous visit (from the past 240 hour(s)).   Labs: BNP (last 3 results) No results for input(s):  BNP in the last 8760 hours. Basic Metabolic Panel:  Recent Labs Lab 10/26/15 2157 10/28/15 0029 10/30/15 0428  NA 133* 135 138  K 4.1 3.5 3.3*  CL 94* 98* 103  CO2 30 27 26   GLUCOSE 600* 293* 130*  BUN 12 9 11   CREATININE 1.16* 0.77 0.96  CALCIUM 9.8 9.9 9.8   Liver Function Tests: No results for input(s): AST, ALT, ALKPHOS, BILITOT, PROT, ALBUMIN in the last 168 hours. No results for input(s): LIPASE, AMYLASE in the last 168 hours. No results for input(s): AMMONIA in the last 168 hours. CBC:  Recent Labs Lab 10/26/15 2157 10/28/15 0029 10/28/15 1213 10/30/15 0428  WBC 7.7 10.1  --  10.1  NEUTROABS 4.2  --   --   --   HGB 13.9 13.1  --  12.9  HCT 40.8 38.5 41.2 38.8  MCV 80.2 79.4  --  79.0  PLT 248 252  --  242   Cardiac Enzymes: No results for input(s): CKTOTAL, CKMB, CKMBINDEX, TROPONINI in the  last 168 hours. BNP: Invalid input(s): POCBNP CBG:  Recent Labs Lab 10/31/15 1112 10/31/15 1654 10/31/15 2109 11/01/15 0624 11/01/15 1150  GLUCAP 273* 211* 203* 178* 230*   D-Dimer No results for input(s): DDIMER in the last 72 hours. Hgb A1c No results for input(s): HGBA1C in the last 72 hours. Lipid Profile No results for input(s): CHOL, HDL, LDLCALC, TRIG, CHOLHDL, LDLDIRECT in the last 72 hours. Thyroid function studies No results for input(s): TSH, T4TOTAL, T3FREE, THYROIDAB in the last 72 hours.  Invalid input(s): FREET3 Anemia work up No results for input(s): VITAMINB12, FOLATE, FERRITIN, TIBC, IRON, RETICCTPCT in the last 72 hours. Urinalysis    Component Value Date/Time   COLORURINE AMBER (A) 10/28/2015 0021   APPEARANCEUR CLEAR 10/28/2015 0021   LABSPEC 1.020 10/28/2015 0021   PHURINE 7.5 10/28/2015 0021   GLUCOSEU >1000 (A) 10/28/2015 0021   HGBUR NEGATIVE 10/28/2015 0021   BILIRUBINUR NEGATIVE 10/28/2015 0021   KETONESUR NEGATIVE 10/28/2015 0021   PROTEINUR NEGATIVE 10/28/2015 0021   UROBILINOGEN 0.2 03/29/2014 1055   NITRITE NEGATIVE 10/28/2015 0021   LEUKOCYTESUR SMALL (A) 10/28/2015 0021   Sepsis Labs Invalid input(s): PROCALCITONIN,  WBC,  LACTICIDVEN Microbiology No results found for this or any previous visit (from the past 240 hour(s)).   Time coordinating discharge: Over 30 minutes  SIGNED:   Newman Pies, MD  [facility] 11/01/2015, 1:57 PM Pager [phone] If 7PM-7AM, please contact night-coverage [email or web address] Password [email or web address]

## 2015-11-02 DIAGNOSIS — E039 Hypothyroidism, unspecified: Secondary | ICD-10-CM | POA: Diagnosis not present

## 2015-11-02 DIAGNOSIS — Z794 Long term (current) use of insulin: Secondary | ICD-10-CM | POA: Diagnosis not present

## 2015-11-02 DIAGNOSIS — E1129 Type 2 diabetes mellitus with other diabetic kidney complication: Secondary | ICD-10-CM | POA: Diagnosis not present

## 2015-11-02 DIAGNOSIS — Z9119 Patient's noncompliance with other medical treatment and regimen: Secondary | ICD-10-CM | POA: Diagnosis not present

## 2015-11-02 DIAGNOSIS — E785 Hyperlipidemia, unspecified: Secondary | ICD-10-CM | POA: Diagnosis not present

## 2015-11-02 DIAGNOSIS — K219 Gastro-esophageal reflux disease without esophagitis: Secondary | ICD-10-CM | POA: Diagnosis not present

## 2015-11-02 DIAGNOSIS — R2681 Unsteadiness on feet: Secondary | ICD-10-CM | POA: Diagnosis not present

## 2015-11-02 DIAGNOSIS — I129 Hypertensive chronic kidney disease with stage 1 through stage 4 chronic kidney disease, or unspecified chronic kidney disease: Secondary | ICD-10-CM | POA: Diagnosis not present

## 2015-11-02 DIAGNOSIS — E1165 Type 2 diabetes mellitus with hyperglycemia: Secondary | ICD-10-CM | POA: Diagnosis not present

## 2015-11-02 DIAGNOSIS — I69398 Other sequelae of cerebral infarction: Secondary | ICD-10-CM | POA: Diagnosis not present

## 2015-11-03 ENCOUNTER — Other Ambulatory Visit: Payer: Self-pay | Admitting: Internal Medicine

## 2015-11-03 DIAGNOSIS — E1165 Type 2 diabetes mellitus with hyperglycemia: Secondary | ICD-10-CM

## 2015-11-03 DIAGNOSIS — E1121 Type 2 diabetes mellitus with diabetic nephropathy: Secondary | ICD-10-CM

## 2015-11-03 DIAGNOSIS — IMO0002 Reserved for concepts with insufficient information to code with codable children: Secondary | ICD-10-CM

## 2015-11-03 MED ORDER — BLOOD GLUCOSE MONITOR KIT: PACK | 0 refills | Status: DC

## 2015-11-03 NOTE — Progress Notes (Signed)
Received phone call from T. Anderson from home health re: Allison [name] who just left the hospital s/p diagnosis of recent stroke.  She replaced batteries in cbg machine and it still didn't work.  Pt confused so caregiver is requesting same machine she already has to avoid having to learn a new one.  She also needs pen needles for her novolog 70/30 pens b/c she was discharged without a script for those.  Will fax cbg machine with lancet and test strip kit  For one touch delica meter as requested.  Also ordered by verbal the needles, but needed written script with instructions and diagnosis for the glucometer/test strips and lancets due to part b coverage.

## 2015-11-04 ENCOUNTER — Telehealth: Payer: Self-pay | Admitting: *Deleted

## 2015-11-04 ENCOUNTER — Other Ambulatory Visit: Payer: Self-pay

## 2015-11-04 DIAGNOSIS — R2681 Unsteadiness on feet: Secondary | ICD-10-CM | POA: Diagnosis not present

## 2015-11-04 DIAGNOSIS — I129 Hypertensive chronic kidney disease with stage 1 through stage 4 chronic kidney disease, or unspecified chronic kidney disease: Secondary | ICD-10-CM | POA: Diagnosis not present

## 2015-11-04 DIAGNOSIS — E1165 Type 2 diabetes mellitus with hyperglycemia: Secondary | ICD-10-CM | POA: Diagnosis not present

## 2015-11-04 DIAGNOSIS — K219 Gastro-esophageal reflux disease without esophagitis: Secondary | ICD-10-CM | POA: Diagnosis not present

## 2015-11-04 DIAGNOSIS — I69398 Other sequelae of cerebral infarction: Secondary | ICD-10-CM | POA: Diagnosis not present

## 2015-11-04 DIAGNOSIS — E1129 Type 2 diabetes mellitus with other diabetic kidney complication: Secondary | ICD-10-CM | POA: Diagnosis not present

## 2015-11-04 NOTE — Patient Outreach (Signed)
Transition of care: Admission date 10/28/2015 Discharge date 11/01/2015  Referral from [place]. Verbal consent documented in referral.  Hgb A1c of 11.2  Placed call to patient who reports that she is doing well. States that [place] and Physical therapist has been out to see her. Reports todays CBG of 200.  States that she is taking her insulin without difficulty. Reports that she has all her medications.  Patient reports that she has a meter and her supplies. States that she is recording her CBG levels.   Patient states no alteration in strength from stroke.   Patient reports that she has follow up planned on 11/15/2015  Outpatient Encounter Prescriptions as of 11/04/2015  Medication Sig  . atorvastatin (LIPITOR) 40 MG tablet take 1 tablet by mouth at bedtime  . blood glucose meter kit and supplies KIT Check blood glucose twice daily before insulin therapy (ICD-10 E11.21, E11.65).  . chlorthalidone (HYGROTON) 25 MG tablet Take 0.5 tablets (12.5 mg total) by mouth daily.  . clopidogrel (PLAVIX) 75 MG tablet Take 1 tablet (75 mg total) by mouth daily.  . insulin aspart protamine- aspart (NOVOLOG MIX 70/30) (70-30) 100 UNIT/ML injection Inject 0.09 mLs (9 Units total) into the skin 2 (two) times daily with a meal.  . levothyroxine (SYNTHROID, LEVOTHROID) 150 MCG tablet Take 1 tablet (150 mcg total) by mouth every morning. For thyroid  . metFORMIN (GLUCOPHAGE) 500 MG tablet Take 2 tablets (1,000 mg total) by mouth 2 (two) times daily with a meal.  . omeprazole (PRILOSEC) 20 MG capsule take 1 capsule by mouth once daily  . vitamin E (VITAMIN E) 1000 UNIT capsule Take 1,000 Units by mouth daily.   No facility-administered encounter medications on file as of 11/04/2015.     PLAN: reviewed transition of care program with patient and the importance of good DM control. Discussed importance of taking medications as prescribed. States that home health nurse  told her what to eat.   Reviewed with patient that I would call her in 1 week for follow and plan a home visit.  Provided my contact information to patient.   Will send this note and barrier letter to MD.  [facility] CM Care Plan Problem One   Flowsheet Row Most Recent Value  Care Plan Problem One  Recent admission for stroke and uncontrolled DM  Role Documenting the Problem One  Care Management [place] for Problem One  Active  [name] (31-90 days)  Patient will report decrease in Hgb A1c in the next 90 days.  [name] Start Date  11/04/15  Interventions for Problem One Long Term Goal  Reviewed with patinet the affects of DM on all organs.  Reviewed importance of following DM diet and taking medications.  [facility] CM Short Term Goal #1 (0-30 days)  Patient will report recording CBG levels daily for the next 30 days.   [facility] CM Short Term Goal #1 Start Date  11/04/15  Interventions for Short Term Goal #1  Reviewed importance of keeping accurate records to assist MD with medication changes. Reviewed with patient to monitor CBG prior to taking her insulin.  [facility] CM Short Term Goal #2 (0-30 days)  Patient will report hospital follow up with primary MD in the next 10 days.   [facility] CM Short Term Goal #2 Start Date  11/04/15  Interventions for Short Term Goal #2  reviewed importance of follow up with MD. Encouraged patient  to take log to MD appointments.  Reviewed symptoms of CVA and importance of immediate call to 911 for symptoms.       Tomasa Rand, RN, BSN, CEN [facility] [employer] [phone]

## 2015-11-04 NOTE — Telephone Encounter (Signed)
Glenard Haring with [facility] called wanting verbal orders for a Education officer, museum and [place]. Verbal orders given.

## 2015-11-08 DIAGNOSIS — R2681 Unsteadiness on feet: Secondary | ICD-10-CM | POA: Diagnosis not present

## 2015-11-08 DIAGNOSIS — E1129 Type 2 diabetes mellitus with other diabetic kidney complication: Secondary | ICD-10-CM | POA: Diagnosis not present

## 2015-11-08 DIAGNOSIS — E1165 Type 2 diabetes mellitus with hyperglycemia: Secondary | ICD-10-CM | POA: Diagnosis not present

## 2015-11-08 DIAGNOSIS — I129 Hypertensive chronic kidney disease with stage 1 through stage 4 chronic kidney disease, or unspecified chronic kidney disease: Secondary | ICD-10-CM | POA: Diagnosis not present

## 2015-11-08 DIAGNOSIS — I69398 Other sequelae of cerebral infarction: Secondary | ICD-10-CM | POA: Diagnosis not present

## 2015-11-08 DIAGNOSIS — K219 Gastro-esophageal reflux disease without esophagitis: Secondary | ICD-10-CM | POA: Diagnosis not present

## 2015-11-11 ENCOUNTER — Other Ambulatory Visit: Payer: Self-pay

## 2015-11-11 DIAGNOSIS — E1165 Type 2 diabetes mellitus with hyperglycemia: Secondary | ICD-10-CM | POA: Diagnosis not present

## 2015-11-11 DIAGNOSIS — I129 Hypertensive chronic kidney disease with stage 1 through stage 4 chronic kidney disease, or unspecified chronic kidney disease: Secondary | ICD-10-CM | POA: Diagnosis not present

## 2015-11-11 DIAGNOSIS — R2681 Unsteadiness on feet: Secondary | ICD-10-CM | POA: Diagnosis not present

## 2015-11-11 DIAGNOSIS — K219 Gastro-esophageal reflux disease without esophagitis: Secondary | ICD-10-CM | POA: Diagnosis not present

## 2015-11-11 DIAGNOSIS — E1129 Type 2 diabetes mellitus with other diabetic kidney complication: Secondary | ICD-10-CM | POA: Diagnosis not present

## 2015-11-11 DIAGNOSIS — I69398 Other sequelae of cerebral infarction: Secondary | ICD-10-CM | POA: Diagnosis not present

## 2015-11-11 NOTE — Patient Outreach (Signed)
Transition of care call: Placed call to patient for transition of care follow up. Patient reports that she is doing well. States that she is taking her insulin without problems. States that she CBG today was 232.  Reports home blood pressure of 131/87.  States that home health nurse saw her today.   Patient denies any new problems or concerns.   Plan: Will continue weekly telephone transition of care calls. Offered home visit for 11/15/2015 and patient reports that she is seeing her primary MD.  Liana Gerold 11/92017 and patient has accepted this date for home visit. Will plan to assist patient with DM teaching about meals.  Tomasa Rand, RN, BSN, CEN [facility] [employer] [phone]

## 2015-11-12 DIAGNOSIS — K219 Gastro-esophageal reflux disease without esophagitis: Secondary | ICD-10-CM | POA: Diagnosis not present

## 2015-11-12 DIAGNOSIS — I69398 Other sequelae of cerebral infarction: Secondary | ICD-10-CM | POA: Diagnosis not present

## 2015-11-12 DIAGNOSIS — E1129 Type 2 diabetes mellitus with other diabetic kidney complication: Secondary | ICD-10-CM | POA: Diagnosis not present

## 2015-11-12 DIAGNOSIS — I129 Hypertensive chronic kidney disease with stage 1 through stage 4 chronic kidney disease, or unspecified chronic kidney disease: Secondary | ICD-10-CM | POA: Diagnosis not present

## 2015-11-12 DIAGNOSIS — E1165 Type 2 diabetes mellitus with hyperglycemia: Secondary | ICD-10-CM | POA: Diagnosis not present

## 2015-11-12 DIAGNOSIS — R2681 Unsteadiness on feet: Secondary | ICD-10-CM | POA: Diagnosis not present

## 2015-11-14 DIAGNOSIS — E1165 Type 2 diabetes mellitus with hyperglycemia: Secondary | ICD-10-CM | POA: Diagnosis not present

## 2015-11-14 DIAGNOSIS — R2681 Unsteadiness on feet: Secondary | ICD-10-CM | POA: Diagnosis not present

## 2015-11-14 DIAGNOSIS — I129 Hypertensive chronic kidney disease with stage 1 through stage 4 chronic kidney disease, or unspecified chronic kidney disease: Secondary | ICD-10-CM | POA: Diagnosis not present

## 2015-11-14 DIAGNOSIS — I69398 Other sequelae of cerebral infarction: Secondary | ICD-10-CM | POA: Diagnosis not present

## 2015-11-14 DIAGNOSIS — K219 Gastro-esophageal reflux disease without esophagitis: Secondary | ICD-10-CM | POA: Diagnosis not present

## 2015-11-14 DIAGNOSIS — E1129 Type 2 diabetes mellitus with other diabetic kidney complication: Secondary | ICD-10-CM | POA: Diagnosis not present

## 2015-11-15 ENCOUNTER — Ambulatory Visit (INDEPENDENT_AMBULATORY_CARE_PROVIDER_SITE_OTHER): Payer: Medicare Other | Admitting: Internal Medicine

## 2015-11-15 ENCOUNTER — Encounter: Payer: Self-pay | Admitting: Internal Medicine

## 2015-11-15 VITALS — BP 122/72 | HR 85 | Temp 97.9°F | Ht 64.0 in | Wt 140.0 lb

## 2015-11-15 DIAGNOSIS — E785 Hyperlipidemia, unspecified: Secondary | ICD-10-CM | POA: Diagnosis not present

## 2015-11-15 DIAGNOSIS — E034 Atrophy of thyroid (acquired): Secondary | ICD-10-CM | POA: Diagnosis not present

## 2015-11-15 DIAGNOSIS — I639 Cerebral infarction, unspecified: Secondary | ICD-10-CM | POA: Diagnosis not present

## 2015-11-15 DIAGNOSIS — Z8673 Personal history of transient ischemic attack (TIA), and cerebral infarction without residual deficits: Secondary | ICD-10-CM

## 2015-11-15 DIAGNOSIS — Z23 Encounter for immunization: Secondary | ICD-10-CM

## 2015-11-15 DIAGNOSIS — E1121 Type 2 diabetes mellitus with diabetic nephropathy: Secondary | ICD-10-CM

## 2015-11-15 DIAGNOSIS — I10 Essential (primary) hypertension: Secondary | ICD-10-CM

## 2015-11-15 DIAGNOSIS — G8194 Hemiplegia, unspecified affecting left nondominant side: Secondary | ICD-10-CM | POA: Diagnosis not present

## 2015-11-15 DIAGNOSIS — F411 Generalized anxiety disorder: Secondary | ICD-10-CM

## 2015-11-15 DIAGNOSIS — IMO0002 Reserved for concepts with insufficient information to code with codable children: Secondary | ICD-10-CM

## 2015-11-15 DIAGNOSIS — E1165 Type 2 diabetes mellitus with hyperglycemia: Secondary | ICD-10-CM | POA: Diagnosis not present

## 2015-11-15 MED ORDER — INSULIN ASPART PROT & ASPART (70-30 MIX) 100 UNIT/ML ~~LOC~~ SUSP: 12.0000 [IU] | Freq: Two times a day (BID) | SUBCUTANEOUS | 11 refills | Status: DC

## 2015-11-15 NOTE — Patient Instructions (Addendum)
Increase Novolog insulin to 12 units inject 2 times daily  Continue other medications as ordered  Follow up with Neurology as scheduled  CANCEL APPTS WITH [place] (INCLUDING LAB). RESCHEDULE FOR December PLEASE  Flu shot given today  PLEASE USE CANE WHEN WALKING

## 2015-11-15 NOTE — Progress Notes (Signed)
Patient ID: Allison [name], female   DOB: [birth year], 81 y.o.   MRN: [id number]    Location:  [facility] Place of Service: OFFICE  Chief Complaint  Patient presents with  . Hospitalization Follow-up    HPI:  81 yo female seen today for hospital f/u. She was admitted 10/16-20th with acute CVA, hypothyroidism, hyperlipidemia, HTN, GERD, PAD, uncontrolled DM, visual hallucinations. She presented to the ED with acute onset of HA and visual hallucinations. She reported having slurred speech and gait instability the week prior. CT head showed possible right internal capsule infarct involving right caudate neucleus. Neurology consulted. 2D echo revealed nml EF; grade 1 DD; mild concentric hypertrophy. Carotid study revealed no stenosis. LDL 213; A1c 11.2%; B12 level 630; folate nml; TSH 1.885; MRI/A brain revealed " Acute right internal capsule lacunar infarct, corresponding to that on the CT today. No associated hemorrhage or mass effect. 2. Negative for emergent large vessel occlusion. Extensive intracranial atherosclerosis. Up to severe stenosis of the proximal right PCA which could be associated with the acute infarct in #1 (right velum striate artery territory). 3. Severe distal right vertebral artery stenosis. Up to moderate stenoses also in the basilar artery, left ICA supraclinoid segment, left MCA origin, left ACA origin. 4. Comparatively mild for age signal changes elsewhere in the cerebral white matter and brainstem compatible with underlying chronic small vessel disease.". She was d/c'd home with [facility] PT/OT/RN.   Today she reports feeling tired. She checks her BS at home 180-200s. And has been high as 260-300s No low BS reactions. She was noncompliant with diet and meds prior to hospital admission. She states she is doing better with diet and meds now. She is receiving PT/OT and has a home care aid but has never seen her as unable to schedule visits yet. She is taking plavix for  anticoagulation  HTN - BP stable on chlorthalidone. She checks BP at home.  Hyperlipidemia - LDL 213 (not at goal)/Tchol 290/HDL 41. She has resumed lipitor. No myalgias. She is having strange dreams  hypothyroidism - she takes levothyroxine.TSH 1.885. No constipation or diarrhea but does have increased anxiety/depression, palpitations. She reports weight loss 4 lbs since last OV  DM - uncontrolled with A1c 11.5%. BS fluctuating high 200 - upper 300s. She takes metformin and novolog 70/30 9 units BID. Numbness in fingers. She saw eye specialist in Feb 2017. No diabetic changes seen. 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 Uses magnifying glass to read at times. She reported angioedema with lantus and levemir  GERD - reflux sx's stable on omeprazole  Anxiety - stable on prn lorazepam  Past Medical History:  Diagnosis Date  . Carpal tunnel syndrome   . Diabetes mellitus without complication ([place])   . Dysphagia   . GERD (gastroesophageal reflux disease)   . Hiatal hernia   . HLD (hyperlipidemia)   . Hx of adenomatous colonic polyps 06/19/2014  . Hypertension   . Hypothyroidism   . Onychomycosis 03/05/2012  . Pain in lower limb 04/18/2013  . Sciatic nerve pain   . Vertigo     Past Surgical History:  Procedure Laterality Date  . BLADDER SURGERY     Mesh implant  . COLONOSCOPY    . LARYNX SURGERY     vocal cords  . PARTIAL HYSTERECTOMY  1978  . VESICOVAGINAL FISTULA CLOSURE W/ TAH  1976    Patient Care Team: Gildardo Cranker, DO as PCP - General (Internal Medicine) Thana Ates, RN as [place]  Management  Social History   Social History  . Marital status: Single    Spouse name: N/A  . Number of children: 2  . Years of education: N/A   Occupational History  . Retired    Social History Main Topics  . Smoking status: Never Smoker  . Smokeless tobacco: Never Used  . Alcohol use No  . Drug use: No  . Sexual activity: No   Other Topics Concern  . Not on file    Social History Narrative   Single   2 sons   Retired   2 cups caffeine daily   05/01/2014        reports that she has never smoked. She has never used smokeless tobacco. She reports that she does not drink alcohol or use drugs.  Family History  Problem Relation Age of Onset  . Alzheimer's disease Mother   . Glaucoma Mother   . Heart disease Father   . Alzheimer's disease Father   . Asthma Sister   . Allergies Sister   . Allergies Sister   . Sleep apnea Sister   . Heart disease Sister   . Breast cancer Maternal Aunt   . Pancreatic cancer Maternal Aunt   . Prostate cancer Maternal Uncle   . Prostate cancer Paternal Uncle   . Alcoholism Maternal Uncle   . Kidney disease Maternal Aunt   . Heart disease Maternal Aunt    Family Status  Relation Status  . Mother Deceased  . Father Deceased  . Sister Alive  . Sister Alive  . Son Alive  . Son Alive  . Sister   . Sister   . Maternal Aunt   . Maternal Aunt   . Maternal Uncle   . Paternal Uncle   . Maternal Uncle   . Maternal Aunt   . Maternal Aunt      Allergies  Allergen Reactions  . Lantus [Insulin Glargine] Itching and Swelling    angioedema  . Levemir [Insulin Detemir] Itching and Swelling    angioedema  . Pineapple Anaphylaxis  . Codeine Other (See Comments)    unknown  . Invokana [Canagliflozin] Other (See Comments)    Constipation, nightmares  . Penicillins Other (See Comments)    Unknown   . Tradjenta [Linagliptin] Other (See Comments)  . Vioxx [Rofecoxib] Other (See Comments)    unknown    Medications: Patient's Medications  New Prescriptions   No medications on file  Previous Medications   ATORVASTATIN (LIPITOR) 40 MG TABLET    take 1 tablet by mouth at bedtime   BLOOD GLUCOSE METER KIT AND SUPPLIES KIT    Check blood glucose twice daily before insulin therapy (ICD-10 E11.21, E11.65).   CHLORTHALIDONE (HYGROTON) 25 MG TABLET    Take 0.5 tablets (12.5 mg total) by mouth daily.    CLOPIDOGREL (PLAVIX) 75 MG TABLET    Take 1 tablet (75 mg total) by mouth daily.   INSULIN ASPART PROTAMINE- ASPART (NOVOLOG MIX 70/30) (70-30) 100 UNIT/ML INJECTION    Inject 0.09 mLs (9 Units total) into the skin 2 (two) times daily with a meal.   LEVOTHYROXINE (SYNTHROID, LEVOTHROID) 150 MCG TABLET    Take 1 tablet (150 mcg total) by mouth every morning. For thyroid   METFORMIN (GLUCOPHAGE) 500 MG TABLET    Take 2 tablets (1,000 mg total) by mouth 2 (two) times daily with a meal.   OMEPRAZOLE (PRILOSEC) 20 MG CAPSULE    take 1 capsule by mouth once daily  VITAMIN E (VITAMIN E) 1000 UNIT CAPSULE    Take 1,000 Units by mouth daily.  Modified Medications   No medications on file  Discontinued Medications   No medications on file    Review of Systems  Constitutional: Positive for fatigue.  Neurological: Positive for weakness.  Hematological: Does not bruise/bleed easily.  All other systems reviewed and are negative.   Vitals:   11/15/15 0843  BP: 122/72  Pulse: 85  Temp: 97.9 F (36.6 C)  TempSrc: Oral  SpO2: 97%  Weight: 140 lb (63.5 kg)  Height: '5\' 4"'  (1.626 m)   Body mass index is 24.03 kg/m.  Physical Exam  Constitutional: She is oriented to person, place, and time. She appears well-developed and well-nourished. No distress.  HENT:  Mouth/Throat: Oropharynx is clear and moist. No oropharyngeal exudate.  Eyes: Pupils are equal, round, and reactive to light. No scleral icterus.  Neck: Neck supple. Carotid bruit is not present. No tracheal deviation present. No thyromegaly present.  Cardiovascular: Normal rate, regular rhythm and intact distal pulses.  Exam reveals no gallop and no friction rub.   Murmur (1/6 SEM) heard. Trace LE edema b/l. no calf TTP.   Pulmonary/Chest: Effort normal and breath sounds normal. No stridor. No respiratory distress. She has no wheezes. She has no rales.  Abdominal: Soft. Bowel sounds are normal. She exhibits no distension and no mass. There  is no hepatomegaly. There is no tenderness. There is no rebound and no guarding.  Musculoskeletal: She exhibits edema.  Lymphadenopathy:    She has no cervical adenopathy.  Neurological: She is alert and oriented to person, place, and time.  Right facial droop; speech slurred; RLE hemiparesis (strength 4/5); gait unsteady  Skin: Skin is warm and dry. No rash noted.  Left lateral malleolus lipoma egg sized, NT  Psychiatric: She has a normal mood and affect. Her behavior is normal. Thought content normal.     Labs reviewed: Admission on 10/28/2015, Discharged on 11/01/2015  Component Date Value Ref Range Status  . Glucose-Capillary 10/28/2015 277* 65 - 99 mg/dL Final  . Sodium 10/28/2015 135  135 - 145 mmol/L Final  . Potassium 10/28/2015 3.5  3.5 - 5.1 mmol/L Final  . Chloride 10/28/2015 98* 101 - 111 mmol/L Final  . CO2 10/28/2015 27  22 - 32 mmol/L Final  . Glucose, Bld 10/28/2015 293* 65 - 99 mg/dL Final  . BUN 10/28/2015 9  6 - 20 mg/dL Final  . Creatinine, Ser 10/28/2015 0.77  0.44 - 1.00 mg/dL Final  . Calcium 10/28/2015 9.9  8.9 - 10.3 mg/dL Final  . GFR calc non Af Amer 10/28/2015 >60  >60 mL/min Final  . GFR calc Af Amer 10/28/2015 >60  >60 mL/min Final   Comment: (NOTE) The eGFR has been calculated using the CKD EPI equation. This calculation has not been validated in all clinical situations. eGFR's persistently <60 mL/min signify possible Chronic Kidney Disease.   . Anion gap 10/28/2015 10  5 - 15 Final  . WBC 10/28/2015 10.1  4.0 - 10.5 K/uL Final  . RBC 10/28/2015 4.85  3.87 - 5.11 MIL/uL Final  . Hemoglobin 10/28/2015 13.1  12.0 - 15.0 g/dL Final  . HCT 10/28/2015 38.5  36.0 - 46.0 % Final  . MCV 10/28/2015 79.4  78.0 - 100.0 fL Final  . MCH 10/28/2015 27.0  26.0 - 34.0 pg Final  . MCHC 10/28/2015 34.0  30.0 - 36.0 g/dL Final  . RDW 10/28/2015 13.4  11.5 - 15.5 %  Final  . Platelets 10/28/2015 252  150 - 400 K/uL Final  . Color, Urine 10/28/2015 AMBER* YELLOW  Final  . APPearance 10/28/2015 CLEAR  CLEAR Final  . Specific Gravity, Urine 10/28/2015 1.020  1.005 - 1.030 Final  . pH 10/28/2015 7.5  5.0 - 8.0 Final  . Glucose, UA 10/28/2015 >1000* NEGATIVE mg/dL Final  . Hgb urine dipstick 10/28/2015 NEGATIVE  NEGATIVE Final  . Bilirubin Urine 10/28/2015 NEGATIVE  NEGATIVE Final  . Ketones, ur 10/28/2015 NEGATIVE  NEGATIVE mg/dL Final  . Protein, ur 10/28/2015 NEGATIVE  NEGATIVE mg/dL Final  . Nitrite 10/28/2015 NEGATIVE  NEGATIVE Final  . Leukocytes, UA 10/28/2015 SMALL* NEGATIVE Final  . Glucose-Capillary 10/28/2015 304* 65 - 99 mg/dL Final  . Comment 1 10/28/2015 Notify RN   Final  . Comment 2 10/28/2015 Document in Chart   Final  . Squamous Epithelial / LPF 10/28/2015 0-5* NONE SEEN Final  . WBC, UA 10/28/2015 0-5  0 - 5 WBC/hpf Final  . RBC / HPF 10/28/2015 0-5  0 - 5 RBC/hpf Final  . Bacteria, UA 10/28/2015 RARE* NONE SEEN Final  . TSH 10/28/2015 1.885  0.350 - 4.500 uIU/mL Final  . Glucose-Capillary 10/28/2015 295* 65 - 99 mg/dL Final  . Hgb A1c MFr Bld 10/29/2015 11.2* 4.8 - 5.6 % Final   Comment: (NOTE)         Pre-diabetes: 5.7 - 6.4         Diabetes: >6.4         Glycemic control for adults with diabetes: <7.0   . Mean Plasma Glucose 10/29/2015 275  mg/dL Final   Comment: (NOTE) Performed At: [facility] [place], [place] [id number] Lindon Romp MD [id number]   . Weight 10/31/2015 2286.4  oz Final  . Height 10/31/2015 64  in Final  . BP 10/31/2015 108/54  mmHg Final  . Right CCA prox sys 10/31/2015 78  cm/s Final  . Right CCA prox dias 10/31/2015 11  cm/s Final  . Right cca dist sys 10/31/2015 -81  cm/s Final  . Left CCA prox sys 10/31/2015 83  cm/s Final  . Left CCA prox dias 10/31/2015 13  cm/s Final  . Left CCA dist sys 10/31/2015 79  cm/s Final  . Left CCA dist dias 10/31/2015 16  cm/s Final  . Left ICA prox sys 10/31/2015 -70  cm/s Final  . Left ICA prox dias 10/31/2015 -17  cm/s Final  .  Left ICA dist sys 10/31/2015 -72  cm/s Final  . Left ICA dist dias 10/31/2015 -18  cm/s Final  . RIGHT ECA DIAS 10/31/2015 -1.00  cm/s Final  . RIGHT VERTEBRAL DIAS 10/31/2015 7.00  cm/s Final  . LEFT ECA DIAS 10/31/2015 -7.00  cm/s Final  . LEFT VERTEBRAL DIAS 10/31/2015 8.00  cm/s Final  . Vitamin B-12 10/28/2015 630  180 - 914 pg/mL Final   Comment: (NOTE) This assay is not validated for testing neonatal or myeloproliferative syndrome specimens for Vitamin B12 levels.   . Folate, Hemolysate 10/29/2015 426.8  Not Estab. ng/mL Final  . Hematocrit 10/29/2015 41.2  34.0 - 46.6 % Final  . Folate, RBC 10/29/2015 1036  >498 ng/mL Final   Comment: (NOTE) Performed At: [facility] [place], [place] [id number] Lindon Romp MD UV:[id number]   . Glucose-Capillary 10/28/2015 244* 65 - 99 mg/dL Final  . Cholesterol 10/29/2015 290* 0 - 200 mg/dL Final  . Triglycerides 10/29/2015 180* <150 mg/dL Final  .  HDL 10/29/2015 41  >40 mg/dL Final  . Total CHOL/HDL Ratio 10/29/2015 7.1  RATIO Final  . VLDL 10/29/2015 36  0 - 40 mg/dL Final  . LDL Cholesterol 10/29/2015 213* 0 - 99 mg/dL Final   Comment:        Total Cholesterol/HDL:CHD Risk Coronary Heart Disease Risk Table                     Men   Women  1/2 Average Risk   3.4   3.3  Average Risk       5.0   4.4  2 X Average Risk   9.6   7.1  3 X Average Risk  23.4   11.0        Use the calculated Patient Ratio above and the CHD Risk Table to determine the patient's CHD Risk.        ATP III CLASSIFICATION (LDL):  <100     mg/dL   Optimal  100-129  mg/dL   Near or Above                    Optimal  130-159  mg/dL   Borderline  160-189  mg/dL   High  >190     mg/dL   Very High   . Glucose-Capillary 10/28/2015 218* 65 - 99 mg/dL Final  . Glucose-Capillary 10/28/2015 314* 65 - 99 mg/dL Final  . Comment 1 10/28/2015 Notify RN   Final  . Comment 2 10/28/2015 Document in Chart   Final  . Glucose-Capillary 10/29/2015  242* 65 - 99 mg/dL Final  . Comment 1 10/29/2015 Notify RN   Final  . Comment 2 10/29/2015 Document in Chart   Final  . Glucose-Capillary 10/29/2015 261* 65 - 99 mg/dL Final  . Glucose-Capillary 10/29/2015 272* 65 - 99 mg/dL Final  . Sodium 10/30/2015 138  135 - 145 mmol/L Final  . Potassium 10/30/2015 3.3* 3.5 - 5.1 mmol/L Final  . Chloride 10/30/2015 103  101 - 111 mmol/L Final  . CO2 10/30/2015 26  22 - 32 mmol/L Final  . Glucose, Bld 10/30/2015 130* 65 - 99 mg/dL Final  . BUN 10/30/2015 11  6 - 20 mg/dL Final  . Creatinine, Ser 10/30/2015 0.96  0.44 - 1.00 mg/dL Final  . Calcium 10/30/2015 9.8  8.9 - 10.3 mg/dL Final  . GFR calc non Af Amer 10/30/2015 55* >60 mL/min Final  . GFR calc Af Amer 10/30/2015 >60  >60 mL/min Final   Comment: (NOTE) The eGFR has been calculated using the CKD EPI equation. This calculation has not been validated in all clinical situations. eGFR's persistently <60 mL/min signify possible Chronic Kidney Disease.   . Anion gap 10/30/2015 9  5 - 15 Final  . WBC 10/30/2015 10.1  4.0 - 10.5 K/uL Final  . RBC 10/30/2015 4.91  3.87 - 5.11 MIL/uL Final  . Hemoglobin 10/30/2015 12.9  12.0 - 15.0 g/dL Final  . HCT 10/30/2015 38.8  36.0 - 46.0 % Final  . MCV 10/30/2015 79.0  78.0 - 100.0 fL Final  . MCH 10/30/2015 26.3  26.0 - 34.0 pg Final  . MCHC 10/30/2015 33.2  30.0 - 36.0 g/dL Final  . RDW 10/30/2015 13.7  11.5 - 15.5 % Final  . Platelets 10/30/2015 242  150 - 400 K/uL Final  . Glucose-Capillary 10/29/2015 150* 65 - 99 mg/dL Final  . Comment 1 10/29/2015 Notify RN   Final  . Comment 2 10/29/2015 Document  in Chart   Final  . Glucose-Capillary 10/30/2015 155* 65 - 99 mg/dL Final  . Comment 1 10/30/2015 Notify RN   Final  . Comment 2 10/30/2015 Document in Chart   Final  . Glucose-Capillary 10/30/2015 234* 65 - 99 mg/dL Final  . Glucose-Capillary 10/30/2015 208* 65 - 99 mg/dL Final  . Glucose-Capillary 10/30/2015 151* 65 - 99 mg/dL Final  . Glucose-Capillary  10/31/2015 230* 65 - 99 mg/dL Final  . Comment 1 10/31/2015 Notify RN   Final  . Comment 2 10/31/2015 Document in Chart   Final  . Glucose-Capillary 10/31/2015 273* 65 - 99 mg/dL Final  . Glucose-Capillary 10/31/2015 211* 65 - 99 mg/dL Final  . Glucose-Capillary 10/31/2015 203* 65 - 99 mg/dL Final  . Glucose-Capillary 11/01/2015 178* 65 - 99 mg/dL Final  . Comment 1 11/01/2015 Notify RN   Final  . Comment 2 11/01/2015 Document in Chart   Final  . Glucose-Capillary 11/01/2015 230* 65 - 99 mg/dL Final  Admission on 10/26/2015, Discharged on 10/26/2015  Component Date Value Ref Range Status  . WBC 10/26/2015 7.7  4.0 - 10.5 K/uL Final  . RBC 10/26/2015 5.09  3.87 - 5.11 MIL/uL Final  . Hemoglobin 10/26/2015 13.9  12.0 - 15.0 g/dL Final  . HCT 10/26/2015 40.8  36.0 - 46.0 % Final  . MCV 10/26/2015 80.2  78.0 - 100.0 fL Final  . MCH 10/26/2015 27.3  26.0 - 34.0 pg Final  . MCHC 10/26/2015 34.1  30.0 - 36.0 g/dL Final  . RDW 10/26/2015 13.8  11.5 - 15.5 % Final  . Platelets 10/26/2015 248  150 - 400 K/uL Final  . Neutrophils Relative % 10/26/2015 54  % Final  . Neutro Abs 10/26/2015 4.2  1.7 - 7.7 K/uL Final  . Lymphocytes Relative 10/26/2015 30  % Final  . Lymphs Abs 10/26/2015 2.3  0.7 - 4.0 K/uL Final  . Monocytes Relative 10/26/2015 12  % Final  . Monocytes Absolute 10/26/2015 0.9  0.1 - 1.0 K/uL Final  . Eosinophils Relative 10/26/2015 3  % Final  . Eosinophils Absolute 10/26/2015 0.2  0.0 - 0.7 K/uL Final  . Basophils Relative 10/26/2015 1  % Final  . Basophils Absolute 10/26/2015 0.0  0.0 - 0.1 K/uL Final  . Sodium 10/26/2015 133* 135 - 145 mmol/L Final  . Potassium 10/26/2015 4.1  3.5 - 5.1 mmol/L Final  . Chloride 10/26/2015 94* 101 - 111 mmol/L Final  . CO2 10/26/2015 30  22 - 32 mmol/L Final  . Glucose, Bld 10/26/2015 600* 65 - 99 mg/dL Final   Comment: RESULTS CONFIRMED BY MANUAL DILUTION CRITICAL RESULT CALLED TO, READ BACK BY AND VERIFIED WITH: MAYNARD,C AT 2228 ON  [id number] BY CHERESNOWSKY,T   . BUN 10/26/2015 12  6 - 20 mg/dL Final  . Creatinine, Ser 10/26/2015 1.16* 0.44 - 1.00 mg/dL Final  . Calcium 10/26/2015 9.8  8.9 - 10.3 mg/dL Final  . GFR calc non Af Amer 10/26/2015 44* >60 mL/min Final  . GFR calc Af Amer 10/26/2015 51* >60 mL/min Final   Comment: (NOTE) The eGFR has been calculated using the CKD EPI equation. This calculation has not been validated in all clinical situations. eGFR's persistently <60 mL/min signify possible Chronic Kidney Disease.   . Anion gap 10/26/2015 9  5 - 15 Final  . Color, Urine 10/26/2015 YELLOW  YELLOW Final  . APPearance 10/26/2015 CLEAR  CLEAR Final  . Specific Gravity, Urine 10/26/2015 1.028  1.005 - 1.030 Final  .  pH 10/26/2015 5.5  5.0 - 8.0 Final  . Glucose, UA 10/26/2015 >1000* NEGATIVE mg/dL Final  . Hgb urine dipstick 10/26/2015 NEGATIVE  NEGATIVE Final  . Bilirubin Urine 10/26/2015 NEGATIVE  NEGATIVE Final  . Ketones, ur 10/26/2015 NEGATIVE  NEGATIVE mg/dL Final  . Protein, ur 10/26/2015 NEGATIVE  NEGATIVE mg/dL Final  . Nitrite 10/26/2015 NEGATIVE  NEGATIVE Final  . Leukocytes, UA 10/26/2015 NEGATIVE  NEGATIVE Final  . pH, Ven 10/26/2015 7.386  7.250 - 7.430 Final  . pCO2, Ven 10/26/2015 58.0  44.0 - 60.0 mmHg Final  . pO2, Ven 10/26/2015 19.0* 32.0 - 45.0 mmHg Final  . Bicarbonate 10/26/2015 34.9* 20.0 - 28.0 mmol/L Final  . TCO2 10/26/2015 37  0 - 100 mmol/L Final  . O2 Saturation 10/26/2015 28.0  % Final  . Acid-Base Excess 10/26/2015 8.0* 0.0 - 2.0 mmol/L Final  . Patient temperature 10/26/2015 98.1 F   Final  . Collection site 10/26/2015 IV START   Final  . Drawn by 10/26/2015 RT   Final  . Sample type 10/26/2015 VENOUS   Final  . Comment 10/26/2015 NOTIFIED PHYSICIAN   Final  . Squamous Epithelial / LPF 10/26/2015 0-5* NONE SEEN Final  . WBC, UA 10/26/2015 0-5  0 - 5 WBC/hpf Final  . RBC / HPF 10/26/2015 0-5  0 - 5 RBC/hpf Final  . Bacteria, UA 10/26/2015 RARE* NONE SEEN Final  .  Glucose-Capillary 10/26/2015 459* 65 - 99 mg/dL Final  . Glucose-Capillary 10/27/2015 563* 65 - 99 mg/dL Final  . Comment 1 10/27/2015 Notify RN   Final  . Comment 2 10/27/2015 Document in Chart   Final  Admission on 10/06/2015, Discharged on 10/06/2015  Component Date Value Ref Range Status  . Sodium 10/06/2015 139  135 - 145 mmol/L Final  . Potassium 10/06/2015 3.9  3.5 - 5.1 mmol/L Final  . Chloride 10/06/2015 101  101 - 111 mmol/L Final  . CO2 10/06/2015 28  22 - 32 mmol/L Final  . Glucose, Bld 10/06/2015 306* 65 - 99 mg/dL Final  . BUN 10/06/2015 <5* 6 - 20 mg/dL Final  . Creatinine, Ser 10/06/2015 0.74  0.44 - 1.00 mg/dL Final  . Calcium 10/06/2015 9.5  8.9 - 10.3 mg/dL Final  . GFR calc non Af Amer 10/06/2015 >60  >60 mL/min Final  . GFR calc Af Amer 10/06/2015 >60  >60 mL/min Final   Comment: (NOTE) The eGFR has been calculated using the CKD EPI equation. This calculation has not been validated in all clinical situations. eGFR's persistently <60 mL/min signify possible Chronic Kidney Disease.   . Anion gap 10/06/2015 10  5 - 15 Final  . WBC 10/06/2015 7.3  4.0 - 10.5 K/uL Final  . RBC 10/06/2015 4.92  3.87 - 5.11 MIL/uL Final  . Hemoglobin 10/06/2015 13.2  12.0 - 15.0 g/dL Final  . HCT 10/06/2015 40.0  36.0 - 46.0 % Final  . MCV 10/06/2015 81.3  78.0 - 100.0 fL Final  . MCH 10/06/2015 26.8  26.0 - 34.0 pg Final  . MCHC 10/06/2015 33.0  30.0 - 36.0 g/dL Final  . RDW 10/06/2015 13.4  11.5 - 15.5 % Final  . Platelets 10/06/2015 284  150 - 400 K/uL Final  . Glucose-Capillary 10/06/2015 286* 65 - 99 mg/dL Final  . Glucose-Capillary 10/06/2015 269* 65 - 99 mg/dL Final  Appointment on 09/03/2015  Component Date Value Ref Range Status  . Sodium 09/03/2015 138  135 - 146 mmol/L Final  . Potassium 09/03/2015 4.1  3.5 - 5.3 mmol/L Final  . Chloride 09/03/2015 100  98 - 110 mmol/L Final  . CO2 09/03/2015 28  20 - 31 mmol/L Final  . Glucose, Bld 09/03/2015 216* 65 - 99 mg/dL Final    . BUN 09/03/2015 12  7 - 25 mg/dL Final  . Creat 09/03/2015 0.80  0.60 - 0.93 mg/dL Final   Comment:   For patients > or = 79 years of age: The upper reference limit for Creatinine is approximately 13% higher for people identified as African-American.     . Calcium 09/03/2015 9.5  8.6 - 10.4 mg/dL Final  . ALT 09/03/2015 14  6 - 29 U/L Final  . Cholesterol 09/03/2015 266* 125 - 200 mg/dL Final  . Triglycerides 09/03/2015 217* <150 mg/dL Final  . HDL 09/03/2015 45* >=46 mg/dL Final  . Total CHOL/HDL Ratio 09/03/2015 5.9* <=5.0 Ratio Final  . VLDL 09/03/2015 43* <30 mg/dL Final  . LDL Cholesterol 09/03/2015 178* <130 mg/dL Final   Comment:   Total Cholesterol/HDL Ratio:CHD Risk                        Coronary Heart Disease Risk Table                                        Men       Women          1/2 Average Risk              3.4        3.3              Average Risk              5.0        4.4           2X Average Risk              9.6        7.1           3X Average Risk             23.4       11.0 Use the calculated Patient Ratio above and the CHD Risk table  to determine the patient's CHD Risk.   . Hgb A1c MFr Bld 09/04/2015 10.5* <5.7 % Final   Comment:   For someone without known diabetes, a hemoglobin A1c value of 6.5% or greater indicates that they may have diabetes and this should be confirmed with a follow-up test.   For someone with known diabetes, a value <7% indicates that their diabetes is well controlled and a value greater than or equal to 7% indicates suboptimal control. A1c targets should be individualized based on duration of diabetes, age, comorbid conditions, and other considerations.   Currently, no consensus exists for use of hemoglobin A1c for diagnosis of diabetes for children.     . Mean Plasma Glucose 09/04/2015 255  mg/dL Final  . TSH 09/04/2015 0.35* mIU/L Final   Comment:   Reference Range   > or = 20 Years  0.40-4.50   Pregnancy  Range First trimester  0.26-2.66 Second trimester 0.55-2.73 Third trimester  0.43-2.91       Dg Chest 2 View  Result Date: 10/28/2015 CLINICAL DATA:  Patient with history of CVA.  Confusion. EXAM: CHEST  2 VIEW COMPARISON:  Chest radiograph 10/22/2012. FINDINGS: Normal cardiac and mediastinal contours. No consolidative pulmonary opacities. No pleural effusion or pneumothorax. Mid thoracic spine degenerative changes. IMPRESSION: No active cardiopulmonary disease. Electronically Signed   By: Lovey Newcomer M.D.   On: 10/28/2015 12:21   Ct Head Wo Contrast  Result Date: 10/28/2015 CLINICAL DATA:  81 year old female with history of headache for the past 3 months. Recent hallucinations. EXAM: CT HEAD WITHOUT CONTRAST TECHNIQUE: Contiguous axial images were obtained from the base of the skull through the vertex without intravenous contrast. COMPARISON:  Head CT 06/06/2003. FINDINGS: Brain: Relatively well-defined area of low attenuation involving imaging of the right internal capsule as well as the posterior aspect of the head of the right caudate nucleus, new compared to the prior examination; this is relatively well-defined, favored to represent an old lacunar infarct, however, lateral aspect of the lesion is slightly indistinct (best appreciated on image 12 of series 201). Patchy and confluent areas of decreased attenuation are noted throughout the deep and periventricular white matter of the cerebral hemispheres bilaterally, compatible with chronic microvascular ischemic disease. No acute intracranial abnormalities. Specifically, no acute intracranial hemorrhage, no mass, mass effect, hydrocephalus or abnormal intra or extra-axial fluid collections. Vascular: No hyperdense vessel or unexpected calcification. Skull: Normal. Negative for fracture or focal lesion. Sinuses/Orbits: No acute finding. Other: None. IMPRESSION: 1. New infarct involving the new right genu of the internal capsule and posterior  aspect of the head of the right caudate nucleus. This is predominantly well-defined and very low-attenuation, suggesting that this is been present for a while and is likely old, however, the lateral borders of the lesion are slightly indistinct. The possibility of a late subacute infarction is not excluded. 2. Mild chronic microvascular ischemic changes in the cerebral white matter. These results were called by telephone at the time of interpretation on 10/28/2015 at 8:16 am to Crawford, who verbally acknowledged these results. Electronically Signed   By: Vinnie Langton M.D.   On: 10/28/2015 08:19   Mr Brain Wo Contrast  Result Date: 10/28/2015 CLINICAL DATA:  81 year old female with headache and recent hallucinations. Right internal capsule lacunar infarct which is new compared to 2005 on head CT today. Initial encounter. EXAM: MRI HEAD WITHOUT CONTRAST MRA HEAD WITHOUT CONTRAST TECHNIQUE: Multiplanar, multiecho pulse sequences of the brain and surrounding structures were obtained without intravenous contrast. Angiographic images of the head were obtained using MRA technique without contrast. COMPARISON:  Head CT 0801 hours today. FINDINGS: MRI HEAD FINDINGS Brain: 1.7 cm of confluent restricted diffusion along the right genu and posterior limb internal capsule and tracking to the right cauda thalamic groove corresponding to the noncontrast head CT finding today. Associated T2 and FLAIR hyperintensity. Fairly isointense T1 appearance of the lesion. No associated hemorrhage. No significant mass effect. No other restricted diffusion or acute infarction. No midline shift, mass effect, evidence of mass lesion, ventriculomegaly, extra-axial collection or acute intracranial hemorrhage. Cervicomedullary junction and pituitary are within normal limits. Scattered nonspecific cerebral white matter T2 and FLAIR hyperintensity in both hemispheres. Central T2 hyperintensity in the pons, mild. No cortical  encephalomalacia. No chronic cerebral blood products identified. Outside of the acute findings, the deep gray matter nuclei are largely unremarkable. Vascular: Appear normal. Skull and upper cervical spine: Widespread cervical spine chronic disc and endplate degeneration. Multilevel mild degenerative cervical spinal stenosis suspected. Normal bone marrow signal. Sinuses/Orbits: Visualized paranasal sinuses and mastoids are stable and well pneumatized. Normal orbits soft tissues. Negative scalp soft tissues. Other: Visible  internal auditory structures appear normal. MRA HEAD FINDINGS Antegrade flow in the posterior circulation. However, there is moderate to severe distal right vertebral artery stenosis beyond the right PICA origin. The distal left vertebral artery has a more normal appearance. The left AICA appears dominant. There is moderate basilar artery irregularity and stenosis, worst in the distal basilar segment between the left AICA and SCA origins (series 7, image 53). Both SCA origins remain patent. The right PCA origin is patent. Fetal type left PCA. The right posterior communicating artery also is present. There is tandem moderate severe irregularity and stenosis in the right P1 and P2 segments. There is preserved distal right PCA flow. There is only mild left PCA irregularity and stenosis. Antegrade flow in both ICA siphons. There is moderate irregularity in both cavernous ICA segments and in the left supraclinoid ICA. There is mild to moderate stenosis in the distal left ICA just proximal to the terminus (series 703, image 7). No other hemodynamically significant siphon stenosis identified. Probable atherosclerotic pseudo lesion of the posterior aspect of the distal vertical petrous segment on the right (image 10). Patent bilateral MCA and ACA origins. Moderate irregularity and stenosis at the right MCA origin. Mild irregularity and stenosis in the right M1 segment. Early left MCA branching. No left MCA  branch occlusion identified. Mild to moderate left ACA origin stenosis. Anterior communicating artery and visualized ACA branches are within normal limits. IMPRESSION: 1. Acute right internal capsule lacunar infarct, corresponding to that on the CT today. No associated hemorrhage or mass effect. 2. Negative for emergent large vessel occlusion. Extensive intracranial atherosclerosis. Up to severe stenosis of the proximal right PCA which could be associated with the acute infarct in #1 (right velum striate artery territory). 3. Severe distal right vertebral artery stenosis. Up to moderate stenoses also in the basilar artery, left ICA supraclinoid segment, left MCA origin, left ACA origin. 4. Comparatively mild for age signal changes elsewhere in the cerebral white matter and brainstem compatible with underlying chronic small vessel disease. Electronically Signed   By: Genevie Ann M.D.   On: 10/28/2015 16:06   Mr Jodene Nam Head/brain TI Cm  Result Date: 10/28/2015 CLINICAL DATA:  81 year old female with headache and recent hallucinations. Right internal capsule lacunar infarct which is new compared to 2005 on head CT today. Initial encounter. EXAM: MRI HEAD WITHOUT CONTRAST MRA HEAD WITHOUT CONTRAST TECHNIQUE: Multiplanar, multiecho pulse sequences of the brain and surrounding structures were obtained without intravenous contrast. Angiographic images of the head were obtained using MRA technique without contrast. COMPARISON:  Head CT 0801 hours today. FINDINGS: MRI HEAD FINDINGS Brain: 1.7 cm of confluent restricted diffusion along the right genu and posterior limb internal capsule and tracking to the right cauda thalamic groove corresponding to the noncontrast head CT finding today. Associated T2 and FLAIR hyperintensity. Fairly isointense T1 appearance of the lesion. No associated hemorrhage. No significant mass effect. No other restricted diffusion or acute infarction. No midline shift, mass effect, evidence of mass  lesion, ventriculomegaly, extra-axial collection or acute intracranial hemorrhage. Cervicomedullary junction and pituitary are within normal limits. Scattered nonspecific cerebral white matter T2 and FLAIR hyperintensity in both hemispheres. Central T2 hyperintensity in the pons, mild. No cortical encephalomalacia. No chronic cerebral blood products identified. Outside of the acute findings, the deep gray matter nuclei are largely unremarkable. Vascular: Appear normal. Skull and upper cervical spine: Widespread cervical spine chronic disc and endplate degeneration. Multilevel mild degenerative cervical spinal stenosis suspected. Normal bone marrow signal. Sinuses/Orbits: Visualized  paranasal sinuses and mastoids are stable and well pneumatized. Normal orbits soft tissues. Negative scalp soft tissues. Other: Visible internal auditory structures appear normal. MRA HEAD FINDINGS Antegrade flow in the posterior circulation. However, there is moderate to severe distal right vertebral artery stenosis beyond the right PICA origin. The distal left vertebral artery has a more normal appearance. The left AICA appears dominant. There is moderate basilar artery irregularity and stenosis, worst in the distal basilar segment between the left AICA and SCA origins (series 7, image 53). Both SCA origins remain patent. The right PCA origin is patent. Fetal type left PCA. The right posterior communicating artery also is present. There is tandem moderate severe irregularity and stenosis in the right P1 and P2 segments. There is preserved distal right PCA flow. There is only mild left PCA irregularity and stenosis. Antegrade flow in both ICA siphons. There is moderate irregularity in both cavernous ICA segments and in the left supraclinoid ICA. There is mild to moderate stenosis in the distal left ICA just proximal to the terminus (series 703, image 7). No other hemodynamically significant siphon stenosis identified. Probable  atherosclerotic pseudo lesion of the posterior aspect of the distal vertical petrous segment on the right (image 10). Patent bilateral MCA and ACA origins. Moderate irregularity and stenosis at the right MCA origin. Mild irregularity and stenosis in the right M1 segment. Early left MCA branching. No left MCA branch occlusion identified. Mild to moderate left ACA origin stenosis. Anterior communicating artery and visualized ACA branches are within normal limits. IMPRESSION: 1. Acute right internal capsule lacunar infarct, corresponding to that on the CT today. No associated hemorrhage or mass effect. 2. Negative for emergent large vessel occlusion. Extensive intracranial atherosclerosis. Up to severe stenosis of the proximal right PCA which could be associated with the acute infarct in #1 (right velum striate artery territory). 3. Severe distal right vertebral artery stenosis. Up to moderate stenoses also in the basilar artery, left ICA supraclinoid segment, left MCA origin, left ACA origin. 4. Comparatively mild for age signal changes elsewhere in the cerebral white matter and brainstem compatible with underlying chronic small vessel disease. Electronically Signed   By: Genevie Ann M.D.   On: 10/28/2015 16:06     Assessment/Plan   ICD-9-CM ICD-10-CM   1. History of recent stroke V12.54 Z86.73   2. Hemiparesis of left nondominant side due to cerebral infarction (HCC) 438.22 I63.9     G81.94   3. Uncontrolled type 2 diabetes mellitus with diabetic nephropathy, without long-term current use of insulin (HCC) 250.42 E11.21    583.81 E11.65   4. Essential hypertension 401.9 I10   5. Hypothyroidism due to acquired atrophy of thyroid 244.8 E03.4    246.8    6. Hyperlipidemia LDL goal <100 272.4 E78.5   7. Anxiety state 300.00 F41.1   8. Encounter for immunization Z23 Z23 Flu Vaccine QUAD 36+ mos IM    Increase Novolog insulin to 12 units inject 2 times daily  Continue other medications as ordered  Follow  up with Neurology as scheduled  Flu shot given today  PLEASE USE Warren  F/u in Dec 2017 for routine visit  Densil Ottey S. Perlie Gold  [facility] [address] [place], [place] [address] [phone] Cell (Monday-Friday 8 AM - 5 PM) [phone] After 5 PM and follow prompts

## 2015-11-16 DIAGNOSIS — H04123 Dry eye syndrome of bilateral lacrimal glands: Secondary | ICD-10-CM | POA: Diagnosis not present

## 2015-11-19 ENCOUNTER — Telehealth: Payer: Self-pay

## 2015-11-19 DIAGNOSIS — E1165 Type 2 diabetes mellitus with hyperglycemia: Secondary | ICD-10-CM | POA: Diagnosis not present

## 2015-11-19 DIAGNOSIS — I129 Hypertensive chronic kidney disease with stage 1 through stage 4 chronic kidney disease, or unspecified chronic kidney disease: Secondary | ICD-10-CM | POA: Diagnosis not present

## 2015-11-19 DIAGNOSIS — R2681 Unsteadiness on feet: Secondary | ICD-10-CM | POA: Diagnosis not present

## 2015-11-19 DIAGNOSIS — E1129 Type 2 diabetes mellitus with other diabetic kidney complication: Secondary | ICD-10-CM | POA: Diagnosis not present

## 2015-11-19 DIAGNOSIS — I69398 Other sequelae of cerebral infarction: Secondary | ICD-10-CM | POA: Diagnosis not present

## 2015-11-19 DIAGNOSIS — K219 Gastro-esophageal reflux disease without esophagitis: Secondary | ICD-10-CM | POA: Diagnosis not present

## 2015-11-19 NOTE — Telephone Encounter (Signed)
I spoke with patient's son, [name], about the need to cancel the lab appointment and office visit for tomorrow. He stated that he would patient about the cancelled appointments and about the need to schedule a lab appointment and routine visit for December.

## 2015-11-19 NOTE — Telephone Encounter (Signed)
I called patient to notify her that she does not need to come to appointments tomorrow, however, she needs to schedule a lab and routine visit appointment for December.  I left a message for patient to call the office to discuss these changes.

## 2015-11-20 ENCOUNTER — Other Ambulatory Visit: Payer: Medicare Other

## 2015-11-21 ENCOUNTER — Other Ambulatory Visit: Payer: Self-pay

## 2015-11-21 DIAGNOSIS — K219 Gastro-esophageal reflux disease without esophagitis: Secondary | ICD-10-CM | POA: Diagnosis not present

## 2015-11-21 DIAGNOSIS — E1129 Type 2 diabetes mellitus with other diabetic kidney complication: Secondary | ICD-10-CM | POA: Diagnosis not present

## 2015-11-21 DIAGNOSIS — E1165 Type 2 diabetes mellitus with hyperglycemia: Secondary | ICD-10-CM | POA: Diagnosis not present

## 2015-11-21 DIAGNOSIS — R2681 Unsteadiness on feet: Secondary | ICD-10-CM | POA: Diagnosis not present

## 2015-11-21 DIAGNOSIS — I129 Hypertensive chronic kidney disease with stage 1 through stage 4 chronic kidney disease, or unspecified chronic kidney disease: Secondary | ICD-10-CM | POA: Diagnosis not present

## 2015-11-21 DIAGNOSIS — I69398 Other sequelae of cerebral infarction: Secondary | ICD-10-CM | POA: Diagnosis not present

## 2015-11-21 NOTE — Patient Outreach (Signed)
Allison [name]) Care Management   11/21/2015  Allison [name] 10-07-1934 [id number]  Allison [name] is an 81 y.o. female Arrived for scheduled home visit. Advanced home health nurse with patient. Son present  Subjective: Patient reports that she is doing better since discharge home from hospital. Reports 1 week before she realized that she had a stroke she had drooling.  States that next week she was confused so family took her to the hospital. Reports that she feels like she is getting back to normal.  Reports that she is eager to get back to driving and playing bingo.  Reports her family is watching everything she eats.  Reports that she enjoys eating sweets, tacos.  States no long term problems since she had a stroke. Reports that she has had follow up with primary MD last week and Insulin was increased to 12 units twice a day.   Patient reports that she has follow up planned with neurology in 2 weeks.  Pending podiatry 12/19/2015. Patient reports interested in better DM management.    Objective:  Awake and alert. Ambulating well.  Vitals:   11/21/15 1052  BP: 128/70  Pulse: 98  Resp: 16  SpO2: 99%  Weight: 140 lb (63.5 kg)  Height: 1.626 m ('5\' 4"' )   Review of Systems  Constitutional: Negative.   HENT: Negative.   Eyes: Negative.   Respiratory: Negative.   Cardiovascular: Negative.   Gastrointestinal: Negative.   Genitourinary: Positive for frequency.  Musculoskeletal: Negative.   Skin:       Reports skin is dry  Neurological: Negative.   Endo/Heme/Allergies: Negative.   Psychiatric/Behavioral: Negative.     Physical Exam  Constitutional: She appears well-developed and well-nourished.  Cardiovascular: Normal rate, normal heart sounds and intact distal pulses.   Respiratory: Effort normal and breath sounds normal.  Lungs clear  GI: Soft. Bowel sounds are normal.  Musculoskeletal: Normal range of motion.  Neurological: She is alert.  Able to verbalize  month, day, president and year, and DOB.  Grips equal and strong  Skin: Skin is warm and dry.  Psychiatric: She has a normal mood and affect. Her behavior is normal. Judgment and thought content normal.    Encounter Medications:   Outpatient Encounter Prescriptions as of 11/21/2015  Medication Sig  . atorvastatin (LIPITOR) 40 MG tablet take 1 tablet by mouth at bedtime  . betamethasone dipropionate (DIPROLENE) 0.05 % cream Apply topically 2 (two) times daily.  . blood glucose meter kit and supplies KIT Check blood glucose twice daily before insulin therapy (ICD-10 E11.21, E11.65).  . chlorthalidone (HYGROTON) 25 MG tablet Take 0.5 tablets (12.5 mg total) by mouth daily.  . clopidogrel (PLAVIX) 75 MG tablet Take 1 tablet (75 mg total) by mouth daily.  . insulin aspart protamine- aspart (NOVOLOG MIX 70/30) (70-30) 100 UNIT/ML injection Inject 0.12 mLs (12 Units total) into the skin 2 (two) times daily with a meal.  . levothyroxine (SYNTHROID, LEVOTHROID) 150 MCG tablet Take 1 tablet (150 mcg total) by mouth every morning. For thyroid  . metFORMIN (GLUCOPHAGE) 500 MG tablet Take 2 tablets (1,000 mg total) by mouth 2 (two) times daily with a meal.  . omeprazole (PRILOSEC) 20 MG capsule take 1 capsule by mouth once daily  . vitamin E (VITAMIN E) 1000 UNIT capsule Take 1,000 Units by mouth daily.   No facility-administered encounter medications on file as of 11/21/2015.     Functional Status:   In your present state of health, do  you have any difficulty performing the following activities: 11/21/2015 11/21/2015  Hearing? N N  Vision? Y Y  Difficulty concentrating or making decisions? [name]  Walking or climbing stairs? N -  Dressing or bathing? N -  Doing errands, shopping? Y -  Conservation officer, nature and eating ? N -  Using the Toilet? N -  In the past six months, have you accidently leaked urine? Y -  Do you have problems with loss of bowel control? N -  Managing your Medications? N -  Managing your  Finances? N -  Housekeeping or managing your Housekeeping? N -  Some recent data might be hidden    Fall/Depression Screening:    PHQ 2/9 Scores 11/21/2015 10/08/2014 06/27/2014 12/05/2013 10/04/2013  PHQ - 2 Score 0 0 0 0 0   Fall Risk  11/21/2015 11/15/2015 09/04/2015 05/06/2015 04/08/2015  Falls in the past year? No No No No No   Assessment:   (1) reviewed [facility] transition of care program. Provided new packet packet, magnet and my contact card. New consent completed.  Reviewed weekly outreach attempts for first 30 days since discharge home. (2) Hgb A1c of 11.2.     Fasting CBG today of 190.  Averaged greater than 275 for the last month. Eager to learn about DM diet.  (3) no advanced directives. (4) post stroke. No deficits per patient. Grips equal and strong.  Plan:  (1) Consent scanned into chart.  (2) Provided DM education packet, Emmi education. Reviewed low carb diets. Patient agreed to review. Will follow up with patient in 1 week. (3) Patient states that she has a packet from the hospital and will review. (4) Provided EMMI education about stokes and recovery. Reviewed signs and symptoms of a stroke and reasons for calling 911.  Encouraged patient to attend her neurology follow up appointments as scheduled.   Reviewed call a nurse line call from patient yesterday.  Patient admits to eating candy and having a high CBG.  Reports that her phone is not working right and that's why she did not answer the call a nurse when they called back.   Care planning and goal setting during home visit. Primary goal to improve DM control. Next outreach planned in 1 week.  This note sent to MD.   [facility] CM Care Plan Problem One   Flowsheet Row Most Recent Value  Care Plan Problem One  Recent admission for stroke and uncontrolled DM  Role Documenting the Problem One  Care Management [facility] for Problem One  Active  [name] (31-90 days)  Patient will report decrease in Hgb A1c in  the next 90 days.  [name] Start Date  11/04/15  Interventions for Problem One Long Term Goal  Provided DM education materials during home visit. Reviewed importance of DM control.   [facility] CM Short Term Goal #1 (0-30 days)  Patient will report recording CBG levels daily for the next 30 days.   [facility] CM Short Term Goal #1 Start Date  11/04/15  Interventions for Short Term Goal #1  Reviewed reasons to call MD for continued high CBG readings.  [facility] CM Short Term Goal #2 (0-30 days)  Patient will report hospital follow up with primary MD in the next 10 days.   [facility] CM Short Term Goal #2 Start Date  11/04/15  [facility] CM Short Term Goal #2 Met Date  11/21/15  Interventions for Short Term Goal #2  Reviewed importance of  attending follow up MD appointment.  [facility] CM Short Term Goal #3 (0-30 days)  Patient will read and reviewed DM educational materials within the next 7 days.  [facility] CM Short Term Goal #3 Start Date  11/21/15  Interventions for Short Tern Goal #3  Encouraged pateint to follow DM diet. Reviewed good snack choices.     Tomasa Rand, RN, BSN, CEN [facility] [employer] [phone]

## 2015-11-22 ENCOUNTER — Ambulatory Visit: Payer: Medicare Other | Admitting: Internal Medicine

## 2015-11-26 ENCOUNTER — Encounter: Payer: Self-pay | Admitting: Nurse Practitioner

## 2015-11-26 ENCOUNTER — Ambulatory Visit (INDEPENDENT_AMBULATORY_CARE_PROVIDER_SITE_OTHER): Payer: Medicare Other | Admitting: Nurse Practitioner

## 2015-11-26 VITALS — BP 122/78 | HR 98 | Temp 97.8°F | Resp 18 | Ht 64.0 in | Wt 140.6 lb

## 2015-11-26 DIAGNOSIS — E1121 Type 2 diabetes mellitus with diabetic nephropathy: Secondary | ICD-10-CM

## 2015-11-26 DIAGNOSIS — IMO0002 Reserved for concepts with insufficient information to code with codable children: Secondary | ICD-10-CM

## 2015-11-26 DIAGNOSIS — I639 Cerebral infarction, unspecified: Secondary | ICD-10-CM | POA: Diagnosis not present

## 2015-11-26 DIAGNOSIS — E1165 Type 2 diabetes mellitus with hyperglycemia: Secondary | ICD-10-CM | POA: Diagnosis not present

## 2015-11-26 NOTE — Patient Instructions (Signed)
FOLLOW UP in 2 weeks with [facility] Boards D Referral placed for diabetic nutritionist  Please supervise pt with all medications (oral and shots)  Record blood sugars and bring to next visit Be on the look out for hypoglycemia, notify if this occurs    Hypoglycemia  Hypoglycemia is when the sugar (glucose) level in the blood is too low. Symptoms of low blood sugar may include:  Feeling:  Hungry.  Worried or nervous (anxious).  Sweaty and clammy.  Confused.  Dizzy.  Sleepy.  Sick to your stomach (nauseous).  Having:  A fast heartbeat.  A headache.  A change in your vision.  Jerky movements that you cannot control (seizure).  Nightmares.  Tingling or no feeling (numbness) around the mouth, lips, or tongue.  Having trouble with:  Talking.  Paying attention (concentrating).  Moving (coordination).  Sleeping.  Shaking.  Passing out (fainting).  Getting upset easily (irritability). Low blood sugar can happen to people who have diabetes and people who do not have diabetes. Low blood sugar can happen quickly, and it can be an emergency. Treating Low Blood Sugar  Low blood sugar is often treated by eating or drinking something sugary right away. If you can think clearly and swallow safely, follow the 15:15 rule:  Take 15 grams of a fast-acting carb (carbohydrate). Some fast-acting carbs are:  1 tube of glucose gel.  3 sugar tablets (glucose pills).  6-8 pieces of hard candy.  4 oz (120 mL) of fruit juice.  4 oz (120 mL) of regular (not diet) soda.  Check your blood sugar 15 minutes after you take the carb.  If your blood sugar is still at or below 70 mg/dL (3.9 mmol/L), take 15 grams of a carb again.  If your blood sugar does not go above 70 mg/dL (3.9 mmol/L) after 3 tries, get help right away.  After your blood sugar goes back to normal, eat a meal or a snack within 1 hour. Treating Very Low Blood Sugar  If your blood sugar is at or below 54  mg/dL (3 mmol/L), you have very low blood sugar (severe hypoglycemia). This is an emergency. Do not wait to see if the symptoms will go away. Get medical help right away. Call your local emergency services (911 in the [place] Do not drive yourself to the hospital. If you have very low blood sugar and you cannot eat or drink, you may need a glucagon shot (injection). A family member or friend should learn how to check your blood sugar and how to give you a glucagon shot. Ask your doctor if you need to have a glucagon shot kit at home. Follow these instructions at home: General instructions  Avoid any diets that cause you to not eat enough food. Talk with your doctor before you start any new diet.  Take over-the-counter and prescription medicines only as told by your doctor.  Limit alcohol to no more than 1 drink per day for nonpregnant women and 2 drinks per day for men. One drink equals 12 oz of beer, 5 oz of wine, or 1 oz of hard liquor.  Keep all follow-up visits as told by your doctor. This is important. If You Have Diabetes:   Make sure you know the symptoms of low blood sugar.  Always keep a source of sugar with you, such as:  Sugar.  Sugar tablets.  Glucose gel.  Fruit juice.  Regular soda (not diet soda).  Milk.  Hard candy.  Honey.  Take your medicines as told.  Follow your exercise and meal plan.  Eat on time. Do not skip meals.  Follow your sick day plan when you cannot eat or drink normally. Make this plan ahead of time with your doctor.  Check your blood sugar as often as told by your doctor. Always check before and after exercise.  Share your diabetes care plan with:  Your work or school.  People you live with.  Check your pee (urine) for ketones:  When you are sick.  As told by your doctor.  Carry a card or wear jewelry that says you have diabetes. If You Have Low Blood Sugar From Other Causes:   Check your blood sugar as often as told by your  doctor.  Follow instructions from your doctor about what you cannot eat or drink. Contact a doctor if:  You have trouble keeping your blood sugar in your target range.  You have low blood sugar often. Get help right away if:  You still have symptoms after you eat or drink something sugary.  Your blood sugar is at or below 54 mg/dL (3 mmol/L).  You have jerky movements that you cannot control.  You pass out. These symptoms may be an emergency. Do not wait to see if the symptoms will go away. Get medical help right away. Call your local emergency services (911 in the [place] Do not drive yourself to the hospital.  This information is not intended to replace advice given to you by your health care provider. Make sure you discuss any questions you have with your health care provider. Document Released: 03/25/2009 Document Revised: 06/06/2015 Document Reviewed: 02/01/2015 Elsevier Interactive Patient Education  2017 [employer].

## 2015-11-26 NOTE — Progress Notes (Signed)
Careteam: Patient Care Team: Gildardo Cranker, DO as PCP - General (Internal Medicine) Thana Ates, RN as [place] Management  Advanced Directive information Does patient have an advance directive?: No  Allergies  Allergen Reactions  . Lantus [Insulin Glargine] Itching and Swelling    angioedema  . Levemir [Insulin Detemir] Itching and Swelling    angioedema  . Pineapple Anaphylaxis  . Codeine Other (See Comments)    unknown  . Invokana [Canagliflozin] Other (See Comments)    Constipation, nightmares  . Penicillins Other (See Comments)    Unknown   . Tradjenta [Linagliptin] Other (See Comments)  . Vioxx [Rofecoxib] Other (See Comments)    unknown    Chief Complaint  Patient presents with  . Acute Visit    Elevated blood sugar (478 last night) (127 this am). Has missed at least 1 dose of insulin and metformin, meals are irregular (son says she forgets to eat)     HPI: Patient is a 81 y.o. female seen in the office today for follow up on blood sugar. Pt son with her. Pt with hx of CVA, DM, hyperlipidemia, htn.  Pts blood sugars are elevated. Someone is with her most of the time helping her but trying to let her be independent.  Does not know when her insulin was last changed from 9 to 12 units but is taking 12 units.  Did not take insulin this morning or metformin.  Not skipping meals but not eating what she used to.  Does not know what she is supposed to eat per son.  No blurred vision, no increase in thirst or hungry.  Increase in neuropathy but she has stopped her gabapentin.    Review of Systems:  Review of Systems  Constitutional: Negative for activity change, appetite change, fatigue and unexpected weight change.       Decrease in appetite   Eyes: Negative.   Respiratory: Negative for cough and shortness of breath.   Cardiovascular: Negative for chest pain, palpitations and leg swelling.  Gastrointestinal: Negative for abdominal pain,  constipation and diarrhea.  Endocrine: Negative for polydipsia, polyphagia and polyuria.  Genitourinary: Negative for difficulty urinating and dysuria.  Neurological: Positive for numbness. Negative for dizziness and weakness.  Hematological: Does not bruise/bleed easily.  Psychiatric/Behavioral: Positive for confusion. Negative for agitation and behavioral problems.    Past Medical History:  Diagnosis Date  . Carpal tunnel syndrome   . Diabetes mellitus without complication ([place])   . Dysphagia   . GERD (gastroesophageal reflux disease)   . Hiatal hernia   . HLD (hyperlipidemia)   . Hx of adenomatous colonic polyps 06/19/2014  . Hypertension   . Hypothyroidism   . Onychomycosis 03/05/2012  . Pain in lower limb 04/18/2013  . Sciatic nerve pain   . Vertigo    Past Surgical History:  Procedure Laterality Date  . BLADDER SURGERY     Mesh implant  . COLONOSCOPY    . LARYNX SURGERY     vocal cords  . PARTIAL HYSTERECTOMY  1978  . VESICOVAGINAL FISTULA CLOSURE W/ TAH  1976   Social History:   reports that she has never smoked. She has never used smokeless tobacco. She reports that she does not drink alcohol or use drugs.  Family History  Problem Relation Age of Onset  . Alzheimer's disease Mother   . Glaucoma Mother   . Heart disease Father   . Alzheimer's disease Father   . Asthma Sister   .  Allergies Sister   . Allergies Sister   . Sleep apnea Sister   . Heart disease Sister   . Breast cancer Maternal Aunt   . Pancreatic cancer Maternal Aunt   . Prostate cancer Maternal Uncle   . Prostate cancer Paternal Uncle   . Alcoholism Maternal Uncle   . Kidney disease Maternal Aunt   . Heart disease Maternal Aunt     Medications: Patient's Medications  New Prescriptions   No medications on file  Previous Medications   ATORVASTATIN (LIPITOR) 40 MG TABLET    take 1 tablet by mouth at bedtime   BETAMETHASONE DIPROPIONATE (DIPROLENE) 0.05 % CREAM    Apply topically 2 (two)  times daily.   BLOOD GLUCOSE METER KIT AND SUPPLIES KIT    Check blood glucose twice daily before insulin therapy (ICD-10 E11.21, E11.65).   CHLORTHALIDONE (HYGROTON) 25 MG TABLET    Take 0.5 tablets (12.5 mg total) by mouth daily.   CLOPIDOGREL (PLAVIX) 75 MG TABLET    Take 1 tablet (75 mg total) by mouth daily.   INSULIN ASPART PROTAMINE- ASPART (NOVOLOG MIX 70/30) (70-30) 100 UNIT/ML INJECTION    Inject 0.12 mLs (12 Units total) into the skin 2 (two) times daily with a meal.   LEVOTHYROXINE (SYNTHROID, LEVOTHROID) 150 MCG TABLET    Take 1 tablet (150 mcg total) by mouth every morning. For thyroid   METFORMIN (GLUCOPHAGE) 500 MG TABLET    Take 2 tablets (1,000 mg total) by mouth 2 (two) times daily with a meal.   OMEPRAZOLE (PRILOSEC) 20 MG CAPSULE    take 1 capsule by mouth once daily   VITAMIN E (VITAMIN E) 1000 UNIT CAPSULE    Take 1,000 Units by mouth daily.  Modified Medications   No medications on file  Discontinued Medications   No medications on file     Physical Exam:  Vitals:   11/26/15 1151  BP: 122/78  Pulse: 98  Resp: 18  Temp: 97.8 F (36.6 C)  SpO2: 98%  Weight: 140 lb 9.6 oz (63.8 kg)  Height: _0  (1.626 m)   Body mass index is 24.13 kg/m.  Physical Exam  Constitutional: She appears well-developed and well-nourished. No distress.  HENT:  Mouth/Throat: Oropharynx is clear and moist. No oropharyngeal exudate.  Eyes: Pupils are equal, round, and reactive to light. No scleral icterus.  Neck: Neck supple. Carotid bruit is not present.  Cardiovascular: Normal rate, regular rhythm and intact distal pulses.  Exam reveals no gallop and no friction rub.   Murmur (1/6 SEM) heard. Trace LE edema b/l. no calf TTP.   Pulmonary/Chest: Effort normal and breath sounds normal. No respiratory distress. She has no wheezes. She has no rales.  Abdominal: Soft. Bowel sounds are normal. There is no hepatomegaly.  Musculoskeletal: She exhibits edema.  Neurological: She is  alert. She exhibits normal muscle tone.  Skin: Skin is warm and dry.  Psychiatric: She has a normal mood and affect. Her behavior is normal. Thought content normal.    Labs reviewed: Basic Metabolic Panel:  Recent Labs  05/22/15 0845 09/03/15 0845  10/26/15 2157 10/28/15 0029 10/28/15 0721 10/30/15 0428  NA 139 138  < > 133* 135  --  138  K 4.3 4.1  < > 4.1 3.5  --  3.3*  CL 96 100  < > 94* 98*  --  103  CO2 27 28  < > 30 27  --  26  GLUCOSE 231* 216*  < > 600*  293*  --  130*  BUN 10 12  < > 12 9  --  11  CREATININE 0.79 0.80  < > 1.16* 0.77  --  0.96  CALCIUM 9.7 9.5  < > 9.8 9.9  --  9.8  TSH 3.470 0.35*  --   --   --  1.885  --   < > = values in this interval not displayed. Liver Function Tests:  Recent Labs  02/20/15 0808 05/22/15 0845 09/03/15 0845  AST 9 11  --   ALT _0 ALKPHOS 81 83  --   BILITOT 0.3 0.5  --   PROT 6.5 7.0  --   ALBUMIN 3.8 4.0  --    No results for input(s): LIPASE, AMYLASE in the last 8760 hours. No results for input(s): AMMONIA in the last 8760 hours. CBC:  Recent Labs  02/20/15 0808  10/26/15 2157 10/28/15 0029 10/28/15 1213 10/30/15 0428  WBC 8.1  < > 7.7 10.1  --  10.1  NEUTROABS 4.1  --  4.2  --   --   --   HGB  --   < > 13.9 13.1  --  12.9  HCT 38.2  < > 40.8 38.5 41.2 38.8  MCV 81  < > 80.2 79.4  --  79.0  PLT 251  < > 248 252  --  242  < > = values in this interval not displayed. Lipid Panel:  Recent Labs  02/20/15 0808 05/22/15 0845 09/03/15 0845 10/29/15 0352  CHOL 260* 273* 266* 290*  HDL 48 54 45* 41  LDLCALC 189* 198* 178* 213*  TRIG 117 103 217* 180*  CHOLHDL 5.4*  --  5.9* 7.1   TSH:  Recent Labs  05/22/15 0845 09/03/15 0845 10/28/15 0721  TSH 3.470 0.35* 1.885   A1C: Lab Results  Component Value Date   HGBA1C 11.2 (H) 10/28/2015     Assessment/Plan 1. Uncontrolled type 2 diabetes mellitus with diabetic nephropathy, without long-term current use of insulin ([place]) - pt noncompliant  with medications but with hx of CVA she is thinking that she is taking her medication but forgetting to take it.  -pt has family with her helping her throughout the day. To supervise her while she is taking her oral medication and insulin  - Ambulatory referral to diabetic education -education on hypoglycemia reviewed with pt and son and what to do if this occurs, they understands.  -to follow up in 2 weeks with Tivis Ringer, pharm d and bring blood sugar log   Pete Schnitzer K. Harle Battiest  [facility] [phone] 8 am - 5 pm) [phone] (after hours)

## 2015-11-27 ENCOUNTER — Telehealth: Payer: Self-pay | Admitting: *Deleted

## 2015-11-27 DIAGNOSIS — I69398 Other sequelae of cerebral infarction: Secondary | ICD-10-CM | POA: Diagnosis not present

## 2015-11-27 DIAGNOSIS — I129 Hypertensive chronic kidney disease with stage 1 through stage 4 chronic kidney disease, or unspecified chronic kidney disease: Secondary | ICD-10-CM | POA: Diagnosis not present

## 2015-11-27 DIAGNOSIS — R2681 Unsteadiness on feet: Secondary | ICD-10-CM | POA: Diagnosis not present

## 2015-11-27 DIAGNOSIS — K219 Gastro-esophageal reflux disease without esophagitis: Secondary | ICD-10-CM | POA: Diagnosis not present

## 2015-11-27 DIAGNOSIS — E1165 Type 2 diabetes mellitus with hyperglycemia: Secondary | ICD-10-CM | POA: Diagnosis not present

## 2015-11-27 DIAGNOSIS — E1129 Type 2 diabetes mellitus with other diabetic kidney complication: Secondary | ICD-10-CM | POA: Diagnosis not present

## 2015-11-27 NOTE — Telephone Encounter (Signed)
Allison [name] with [facility] called and stated that they were going to go ahead and discharge patient. Today was her last day. Stated that there wasn't anything left to help patient with and that patient can ambulate to her own appointment.

## 2015-11-28 ENCOUNTER — Other Ambulatory Visit: Payer: Self-pay

## 2015-11-28 NOTE — Patient Outreach (Signed)
Transition of care call: Placed call to patient for transition of care. No answer. Left a message and requested a call back.    PLAN:  Scanned diabetic sample diets to [facility] and asked for these to be mailed to patient.  Will plan follow up next week with patient if no return call  Tomasa Rand, RN, BSN, [facility] [employer] [phone]

## 2015-12-03 ENCOUNTER — Ambulatory Visit: Payer: Medicare Other | Admitting: Nurse Practitioner

## 2015-12-03 ENCOUNTER — Encounter: Payer: Self-pay | Admitting: Nurse Practitioner

## 2015-12-03 ENCOUNTER — Other Ambulatory Visit: Payer: Self-pay

## 2015-12-03 ENCOUNTER — Ambulatory Visit (INDEPENDENT_AMBULATORY_CARE_PROVIDER_SITE_OTHER): Payer: Medicare Other | Admitting: Nurse Practitioner

## 2015-12-03 VITALS — BP 115/68 | HR 96 | Ht 64.0 in | Wt 140.0 lb

## 2015-12-03 DIAGNOSIS — I639 Cerebral infarction, unspecified: Secondary | ICD-10-CM | POA: Diagnosis not present

## 2015-12-03 DIAGNOSIS — E785 Hyperlipidemia, unspecified: Secondary | ICD-10-CM

## 2015-12-03 DIAGNOSIS — I63019 Cerebral infarction due to thrombosis of unspecified vertebral artery: Secondary | ICD-10-CM | POA: Diagnosis not present

## 2015-12-03 DIAGNOSIS — E1121 Type 2 diabetes mellitus with diabetic nephropathy: Secondary | ICD-10-CM

## 2015-12-03 DIAGNOSIS — E1165 Type 2 diabetes mellitus with hyperglycemia: Secondary | ICD-10-CM

## 2015-12-03 NOTE — Patient Instructions (Addendum)
Stressed the importance of management of risk factors to prevent further stroke Continue Plavix for secondary stroke prevention Maintain strict control of hypertension with blood pressure goal below 130/90, today's reading 115/68  Control of diabetes with hemoglobin A1c below 6.5 followed by primary care most recent hemoglobin A1c11.2 continue diabetic medications Cholesterol with LDL cholesterol less than 70, followed by primary care,  most recent 213 continue statin drugs Exercise by walking, slowly increase , eat healthy diet with whole grains,  fresh fruits and vegetables Discussed risk for recurrent stroke/ TIA and answered additional questions Follow-up in 3 months

## 2015-12-03 NOTE — Progress Notes (Signed)
I agree with the above plan 

## 2015-12-03 NOTE — Progress Notes (Signed)
[facility]  PATIENT: Allison [name] DOB: March 14, 1934   REASON FOR VISIT: Hospital follow-up for stroke HISTORY FROM: Patient    HISTORY OF PRESENT ILLNESS:81 year old female with a history of diabetes mellitus, hyperlipidemia, hypertension, hypothyroidism, medical noncompliance presented with sudden onset of headache and visual hallucinations on 10/28/2015. In addition, the patient's sister states that the patient has had some slurred speech and gait instability for the past week. Sister states that the patient has been "tripping over her right foot". The patient reports seeing her mother in the kitchen. [name] died in 2009/04/11. The hallucinations persisted. As a result, the sister decided to bring the patient to the emergency department. In the emergency department, the patient was afebrile and hemodynamically stable. CT of the brain showed possible right internal capsule infarct involving the right caudate nucleus. She was admitted to the stroke service. MRI right internal capsule infarct. MRA no emergent large vessel occlusion or severe stenosis right proximal PCA right VA. Moderate stenosis BA left ICA left MCA left ACA. Carotid Doppler no significant stenosis EEG no seizure activity. LDL 213 hemoglobin A1c 11.2.  2-D echo EF 55-60%. She was placed on Plavix from aspirin. She returns to the stroke clinic today for follow-up. She remains on Plavix for secondary stroke prevention and has not had further stroke or TIA symptoms in addition she's had some adjustments to her insulin. She remains on Lipitor 40 mg daily. She received some outpatient physical therapy but that has concluded. She was encouraged to do home exercise program and to be more active. She denies any weakness visual changes speech or swallowing difficulty or increased confusion since discharge. She returns for reevaluation   REVIEW OF SYSTEMS: Full 14 system review of systems performed and notable only  for those listed, all others are neg:  Constitutional: Fatigue Cardiovascular: neg Ear/Nose/Throat: neg  Skin: neg Eyes: neg Respiratory: neg Gastroitestinal: Constipation  Hematology/Lymphatic: neg  Endocrine: neg Musculoskeletal:neg Allergy/Immunology: neg Neurological: neg Psychiatric: neg Sleep : neg   ALLERGIES: Allergies  Allergen Reactions  . Lantus [Insulin Glargine] Itching and Swelling    angioedema  . Levemir [Insulin Detemir] Itching and Swelling    angioedema  . Pineapple Anaphylaxis  . Codeine Other (See Comments)    unknown  . Invokana [Canagliflozin] Other (See Comments)    Constipation, nightmares  . Penicillins Other (See Comments)    Unknown   . Tradjenta [Linagliptin] Other (See Comments)  . Vioxx [Rofecoxib] Other (See Comments)    unknown    HOME MEDICATIONS: Outpatient Medications Prior to Visit  Medication Sig Dispense Refill  . atorvastatin (LIPITOR) 40 MG tablet take 1 tablet by mouth at bedtime 30 tablet 3  . betamethasone dipropionate (DIPROLENE) 0.05 % cream Apply topically 2 (two) times daily.    . blood glucose meter kit and supplies KIT Check blood glucose twice daily before insulin therapy (ICD-10 E11.21, E11.65). 1 each 0  . chlorthalidone (HYGROTON) 25 MG tablet Take 0.5 tablets (12.5 mg total) by mouth daily. 30 tablet 1  . clopidogrel (PLAVIX) 75 MG tablet Take 1 tablet (75 mg total) by mouth daily. 30 tablet 1  . insulin aspart protamine- aspart (NOVOLOG MIX 70/30) (70-30) 100 UNIT/ML injection Inject 0.12 mLs (12 Units total) into the skin 2 (two) times daily with a meal. 10 mL 11  . levothyroxine (SYNTHROID, LEVOTHROID) 150 MCG tablet Take 1 tablet (150 mcg total) by mouth every morning. For thyroid 30 tablet 3  . metFORMIN (GLUCOPHAGE) 500 MG tablet Take  2 tablets (1,000 mg total) by mouth 2 (two) times daily with a meal. 180 tablet 3  . omeprazole (PRILOSEC) 20 MG capsule take 1 capsule by mouth once daily 30 capsule 3  .  vitamin E (VITAMIN E) 1000 UNIT capsule Take 1,000 Units by mouth daily.     No facility-administered medications prior to visit.     PAST MEDICAL HISTORY: Past Medical History:  Diagnosis Date  . Carpal tunnel syndrome   . Diabetes mellitus without complication ([place])   . Dysphagia   . GERD (gastroesophageal reflux disease)   . Hiatal hernia   . HLD (hyperlipidemia)   . Hx of adenomatous colonic polyps 06/19/2014  . Hypertension   . Hypothyroidism   . Onychomycosis 03/05/2012  . Pain in lower limb 04/18/2013  . Sciatic nerve pain   . Vertigo     PAST SURGICAL HISTORY: Past Surgical History:  Procedure Laterality Date  . BLADDER SURGERY     Mesh implant  . COLONOSCOPY    . LARYNX SURGERY     vocal cords  . PARTIAL HYSTERECTOMY  1978  . VESICOVAGINAL FISTULA CLOSURE W/ TAH  1976    FAMILY HISTORY: Family History  Problem Relation Age of Onset  . Alzheimer's disease Mother   . Glaucoma Mother   . Heart disease Father   . Alzheimer's disease Father   . Asthma Sister   . Allergies Sister   . Allergies Sister   . Sleep apnea Sister   . Heart disease Sister   . Breast cancer Maternal Aunt   . Pancreatic cancer Maternal Aunt   . Prostate cancer Maternal Uncle   . Prostate cancer Paternal Uncle   . Alcoholism Maternal Uncle   . Kidney disease Maternal Aunt   . Heart disease Maternal Aunt     SOCIAL HISTORY: Social History   Social History  . Marital status: Single    Spouse name: N/A  . Number of children: 2  . Years of education: N/A   Occupational History  . Retired    Social History Main Topics  . Smoking status: Never Smoker  . Smokeless tobacco: Never Used  . Alcohol use No  . Drug use: No  . Sexual activity: No   Other Topics Concern  . Not on file   Social History Narrative   Single   2 sons   Retired   2 cups caffeine daily   05/01/2014        PHYSICAL EXAM  Vitals:   12/03/15 1424  BP: 115/68  Pulse: 96  Weight: 140 lb (63.5 kg)    Height: '5\' 4"'$  (1.626 m)   Body mass index is 24.03 kg/m.  Generalized: Well developed, in no acute distress  Head: normocephalic and atraumatic,. Oropharynx benign  Neck: Supple, no carotid bruits  Cardiac: Regular rate rhythm, no murmur  Musculoskeletal: No deformity   Neurological examination   Mentation: Alert oriented to time, place, history taking. Attention span and concentration appropriate. Recent and remote memory intact.  Follows all commands speech and language fluent.   Cranial nerve II-XII: Fundoscopic exam reveals sharp disc margins.Pupils were equal round reactive to light extraocular movements were full, visual field were full on confrontational test. Facial sensation and strength were normal. hearing was intact to finger rubbing bilaterally. Uvula tongue midline. head turning and shoulder shrug were normal and symmetric.Tongue protrusion into cheek strength was normal. Motor: normal bulk and tone, full strength in the BUE, BLE, fine finger movements normal, no  pronator drift. No focal weakness Sensory: normal and symmetric to light touch, pinprick, and  Vibration, in the upper and lower extremities Coordination: finger-nose-finger, heel-to-shin bilaterally, no dysmetria Reflexes: 1+ upper lower and symmetric, plantar responses were flexor bilaterally. Gait and Station: Rising up from seated position without assistance, normal stance,  moderate stride, good arm swing, smooth turning, able to perform tiptoe, and heel walking without difficulty. Tandem gait is steady. No assistive device  DIAGNOSTIC DATA (LABS, IMAGING, TESTING) - I reviewed patient records, labs, notes, testing and imaging myself where available.  Lab Results  Component Value Date   WBC 10.1 10/30/2015   HGB 12.9 10/30/2015   HCT 38.8 10/30/2015   MCV 79.0 10/30/2015   PLT 242 10/30/2015      Component Value Date/Time   NA 138 10/30/2015 0428   NA 139 05/22/2015 0845   K 3.3 (L) 10/30/2015 0428    CL 103 10/30/2015 0428   CO2 26 10/30/2015 0428   GLUCOSE 130 (H) 10/30/2015 0428   BUN 11 10/30/2015 0428   BUN 10 05/22/2015 0845   CREATININE 0.96 10/30/2015 0428   CREATININE 0.80 09/03/2015 0845   CALCIUM 9.8 10/30/2015 0428   CALCIUM 10.7 (H) 08/05/2013 2053   PROT 7.0 05/22/2015 0845   ALBUMIN 4.0 05/22/2015 0845   AST 11 05/22/2015 0845   ALT 14 09/03/2015 0845   ALKPHOS 83 05/22/2015 0845   BILITOT 0.5 05/22/2015 0845   GFRNONAA 55 (L) 10/30/2015 0428   GFRAA >60 10/30/2015 0428   Lab Results  Component Value Date   CHOL 290 (H) 10/29/2015   HDL 41 10/29/2015   LDLCALC 213 (H) 10/29/2015   TRIG 180 (H) 10/29/2015   CHOLHDL 7.1 10/29/2015   Lab Results  Component Value Date   HGBA1C 11.2 (H) 10/28/2015   Lab Results  Component Value Date   VITAMINB12 630 10/28/2015   Lab Results  Component Value Date   TSH 1.885 10/28/2015      ASSESSMENT AND PLAN  81 y.o. year old female  has a past medical history of  diabetes mellitus, hyperlipidemia, hypertension, hypothyroidism, medical noncompliance presented with sudden onset of headache and visual hallucinations on 10/28/2015. In addition, the patient's sister states that the patient has had some slurred speech and gait instability for the past week.MRI  Of the brain right internal capsule infarct. She returns for hospital follow-up    PLAN: Stressed the importance of management of risk factors to prevent further stroke Continue Plavix for secondary stroke prevention Maintain strict control of hypertension with blood pressure goal below 130/90, today's reading 115/68  Control of diabetes with hemoglobin A1c below 6.5 followed by primary care most recent hemoglobin A1c11.2 continue diabetic medications Cholesterol with LDL cholesterol less than 70, followed by primary care,  most recent 213 continue statin drugs Exercise by walking, slowly increase , eat healthy diet with whole grains,  fresh fruits and  vegetables Discussed risk for recurrent stroke/ TIA and answered additional questions Follow-up in 3 months This was a visit requiring 30 minutes  with extensive review of history, hospital chart, counseling and answering questions Dennie Bible, [facility], APRN  [facility] [address] [place], [place] [address] [phone]

## 2015-12-03 NOTE — Patient Outreach (Signed)
Transition of care call:  Placed call to patient for transition of care. Patient answered and identified herself. Patient reports that she is doing well. Reports she has not checked CBG today or taking her insulin.  Reports CBG range in the last week of 127-382.  Reports CBG mostly running about 275.  Reports that she is taking her medications as prescribed. States that she is monitoring and recording CBG 3-4 times per day.  States that she has follow up planned with neurologist later today.  Denies any new problems or concerns. Reviewed with patient plan to go to diabetes treatment center for DM education in 2 weeks.   Plan: will call patient next week for final Transition of care call and will scheduled a routine home visit for ongoing DM management.  Tomasa Rand, RN, BSN, CEN [facility] [employer] [phone]

## 2015-12-04 ENCOUNTER — Other Ambulatory Visit: Payer: Self-pay | Admitting: Internal Medicine

## 2015-12-10 ENCOUNTER — Other Ambulatory Visit: Payer: Self-pay | Admitting: Internal Medicine

## 2015-12-10 ENCOUNTER — Other Ambulatory Visit: Payer: Self-pay

## 2015-12-10 NOTE — Patient Outreach (Signed)
Transition of care: Placed call to patient for transition of care. No answer. Left a message requesting a call back.   PLAN: If no response will attempt again in 2 days. Tomasa Rand, RN, BSN, CEN [facility] [employer] [phone]

## 2015-12-11 ENCOUNTER — Other Ambulatory Visit: Payer: Self-pay | Admitting: *Deleted

## 2015-12-11 MED ORDER — GLUCOSE BLOOD VI STRP: ORAL_STRIP | 11 refills | Status: DC

## 2015-12-11 MED ORDER — ONETOUCH DELICA LANCETS 33G MISC: 6 refills | Status: DC

## 2015-12-11 NOTE — Telephone Encounter (Signed)
Allison [name] with [facility] called and requested Rx to be refaxed with a diagnosis. Refaxed.

## 2015-12-12 ENCOUNTER — Other Ambulatory Visit: Payer: Self-pay

## 2015-12-12 NOTE — Patient Outreach (Signed)
Final transition of care call: Placed call to patient who answered. Reports she is doing "ok" . States that her CBG meter is not working right and she is on her way to the drug store to have someone look at it. Reports CBG yesterday was 297.  States on 12/10/2015 was 341.  These were non fasting readings.  Patient reports that she received the menus that were mailed. States that she has read the menus.    Patient has completed the transition of care program. Reviewed needs and patient still feels like she needs DM assistance.   PLAN: Booked home visit for 12/24/2015.  Will continue to assist patient with DM education.  Tomasa Rand, RN, BSN, CEN [facility] [employer] [phone]

## 2015-12-16 ENCOUNTER — Ambulatory Visit: Payer: Medicare Other | Admitting: Pharmacotherapy

## 2015-12-19 ENCOUNTER — Ambulatory Visit (INDEPENDENT_AMBULATORY_CARE_PROVIDER_SITE_OTHER): Payer: Medicare Other | Admitting: Podiatry

## 2015-12-19 ENCOUNTER — Encounter: Payer: Self-pay | Admitting: Podiatry

## 2015-12-19 VITALS — BP 137/66 | HR 78

## 2015-12-19 DIAGNOSIS — M79672 Pain in left foot: Secondary | ICD-10-CM

## 2015-12-19 DIAGNOSIS — M79671 Pain in right foot: Secondary | ICD-10-CM

## 2015-12-19 DIAGNOSIS — B351 Tinea unguium: Secondary | ICD-10-CM

## 2015-12-19 NOTE — Progress Notes (Signed)
Subjective: 80 year old female presents complaining of painful toe nails.  Had a stroke in October 2017. Complains no after effect. Using a cane as per advice   Objective: Thick dystrophic nails x 10 with yellow fungal debris under nail plate of both great toes.  Pedal pulses are not palpable.  No edema or erythema noted. No abnormal skin lesions noted.  Dry peeling skin posterior right heel.  Assessment: Mycotic nails bilateral. Diabetic Neuropathy. PVD.  Plan: Debrided all nails.  Return in 3 months or as needed. 

## 2015-12-19 NOTE — Patient Instructions (Signed)
Seen for hypertrophic nails. All nails debrided. Return in 3 months or as needed.  

## 2015-12-23 ENCOUNTER — Other Ambulatory Visit: Payer: Self-pay | Admitting: *Deleted

## 2015-12-23 MED ORDER — ONETOUCH DELICA LANCETS 33G MISC: 6 refills | Status: DC

## 2015-12-23 MED ORDER — INSULIN ASPART PROT & ASPART (70-30 MIX) 100 UNIT/ML ~~LOC~~ SUSP: 12.0000 [IU] | Freq: Two times a day (BID) | SUBCUTANEOUS | 11 refills | Status: DC

## 2015-12-24 ENCOUNTER — Other Ambulatory Visit: Payer: Self-pay

## 2015-12-24 NOTE — Patient Outreach (Signed)
[facility]) Care Management   12/24/2015  Allison [name] Jan 08, 1935 [id number]  Allison [name] is an 81 y.o. female  Subjective:  Patient reports that she is feeling great. Reports CBG readings are much improved and that she is following her diet.  Reports that she did receive the meal samples in the mail.  Reports odays CBG 231.   7 day average  225, 14 day average 232.  New meter so no 30 day average. Patient reports that she is taking her medications as prescribed. States that she wants to start driving again.  Denies any new problems or concerns.  States that she hopes to start exercising with silver sneakers soon. Sister is still assisting patient as needed.   Objective:  Awake and alert, ambulating without difficulty.   Keeping good CBG logs and BP logs.  Vitals:   12/24/15 1302  BP: (!) 106/58  Pulse: 82  Resp: 16  SpO2: 98%  Weight: 139 lb (63 kg)  Height: 1.626 m (_0 )   Review of Systems  Constitutional: Negative.   HENT: Negative.   Eyes: Negative.   Respiratory: Negative.   Cardiovascular: Negative.   Gastrointestinal:       Reports that she is taking a laxative as needed  Genitourinary: Negative.   Musculoskeletal:       Reports fingers get numb at times  Skin:       Reports dry skin  Neurological: Negative for dizziness, tingling, tremors, focal weakness, seizures, loss of consciousness and headaches.  Endo/Heme/Allergies: Negative.   Psychiatric/Behavioral: Negative.     Physical Exam  Constitutional: She is oriented to person, place, and time. She appears well-developed and well-nourished.  Cardiovascular: Normal rate, normal heart sounds and intact distal pulses.   Respiratory: Effort normal and breath sounds normal.  Lungs clear  GI: Soft. Bowel sounds are normal.  Musculoskeletal: Normal range of motion. She exhibits no edema.  Neurological: She is alert and oriented to person, place, and time.  Grips equal and strong  Skin:  Skin is warm and dry.  Feet clear without and open areas or lesions.  Psychiatric: She has a normal mood and affect. Her behavior is normal. Judgment and thought content normal.    Encounter Medications:   Outpatient Encounter Prescriptions as of 12/24/2015  Medication Sig  . atorvastatin (LIPITOR) 40 MG tablet take 1 tablet by mouth at bedtime  . betamethasone dipropionate (DIPROLENE) 0.05 % cream Apply topically 2 (two) times daily.  . blood glucose meter kit and supplies KIT Check blood glucose twice daily before insulin therapy (ICD-10 E11.21, E11.65).  . chlorthalidone (HYGROTON) 25 MG tablet Take 0.5 tablets (12.5 mg total) by mouth daily.  . clopidogrel (PLAVIX) 75 MG tablet Take 1 tablet (75 mg total) by mouth daily.  Marland Kitchen glucose blood (ONE TOUCH ULTRA TEST) test strip Use to test blood sugar three times daily. Dx: E11.21  . insulin aspart protamine- aspart (NOVOLOG MIX 70/30) (70-30) 100 UNIT/ML injection Inject 0.12 mLs (12 Units total) into the skin 2 (two) times daily with a meal.  . levothyroxine (SYNTHROID, LEVOTHROID) 150 MCG tablet Take 1 tablet (150 mcg total) by mouth every morning. For thyroid  . metFORMIN (GLUCOPHAGE) 500 MG tablet Take 2 tablets (1,000 mg total) by mouth 2 (two) times daily with a meal.  . omeprazole (PRILOSEC) 20 MG capsule take 1 capsule by mouth once daily  . ONETOUCH DELICA LANCETS 29F MISC Use to test blood sugar three times daily. Dx:  E11.21  . vitamin E (VITAMIN E) 1000 UNIT capsule Take 1,000 Units by mouth daily.   No facility-administered encounter medications on file as of 12/24/2015.     Functional Status:   In your present state of health, do you have any difficulty performing the following activities: 11/21/2015 11/21/2015  Hearing? N N  Vision? Y Y  Difficulty concentrating or making decisions? [name]  Walking or climbing stairs? N -  Dressing or bathing? N -  Doing errands, shopping? Y -  Conservation officer, nature and eating ? N -  Using the  Toilet? N -  In the past six months, have you accidently leaked urine? Y -  Do you have problems with loss of bowel control? N -  Managing your Medications? N -  Managing your Finances? N -  Housekeeping or managing your Housekeeping? N -  Some recent data might be hidden    Fall/Depression Screening:    PHQ 2/9 Scores 11/21/2015 10/08/2014 06/27/2014 12/05/2013 10/04/2013  PHQ - 2 Score 0 0 0 0 0    Assessment:   (1) decreased CBG readings.  Reports following her meal recommendations and taking her medications as prescribed. Has plans to meet with dietician on 12/25/2015  Sister and son are attending with patient. (2) recent foot exam by podiatrist.  (3) no new problems to report.  Plan:  (1) Will plan telephone assessment in 2-3 weeks, if continues to do well then plan to move to health coach or discharge whichever is patients preference. Encouraged patient to continue to follow diet and call MD for concerns. (2) encouraged patient to continue daily inspection and report changes to MD. (3) reviewed plan of care with patient.   [facility] CM Care Plan Problem One   Flowsheet Row Most Recent Value  Care Plan Problem One  Recent admission for stroke and uncontrolled DM  Role Documenting the Problem One  Care Management [place] for Problem One  Active  [name] (31-90 days)  Patient will report decrease in Hgb A1c in the next 90 days.  [name] Start Date  11/04/15  Interventions for Problem One Long Term Goal  Reviewed importance of follow DM diet and taking meds as prescribed. Will see dietician on 12/25/2015  [facility] CM Short Term Goal #1 (0-30 days)  Patient will report recording CBG levels daily for the next 30 days.   THN CM Short Term Goal #1 Start Date  11/04/15  [facility] CM Short Term Goal #1 Met Date  12/12/15  Interventions for Short Term Goal #1  Encouraged patient to continue to montior and record CBG readings.  [facility]N CM Short Term Goal #2 (0-30 days)   Patient will report hospital follow up with primary MD in the next 10 days.   THN CM Short Term Goal #2 Start Date  11/04/15  [facility] CM Short Term Goal #2 Met Date  11/21/15  Interventions for Short Term Goal #2  Reviewed importance of attending follow up MD appointment.  THN CM Short Term Goal #3 (0-30 days)  Patient will read and reviewed DM educational materials within the next 7 days.  THN CM Short Term Goal #3 Start Date  12/03/15 [date reset. pending mailed material.]  [facility]N CM Short Term Goal #3 Met Date  12/12/15  Interventions for Short Tern Goal #3  Encouraged pateint to follow DM diet. Reviewed good snack choices.     Tomasa Rand, RN, BSN, CEN [facility] [employer] [phone]

## 2015-12-25 ENCOUNTER — Ambulatory Visit: Payer: Medicare Other | Admitting: *Deleted

## 2015-12-26 ENCOUNTER — Encounter: Payer: Medicare Other | Attending: Internal Medicine | Admitting: *Deleted

## 2015-12-26 ENCOUNTER — Encounter: Payer: Self-pay | Admitting: *Deleted

## 2015-12-26 DIAGNOSIS — E1121 Type 2 diabetes mellitus with diabetic nephropathy: Secondary | ICD-10-CM

## 2015-12-26 DIAGNOSIS — Z713 Dietary counseling and surveillance: Secondary | ICD-10-CM | POA: Insufficient documentation

## 2015-12-26 DIAGNOSIS — E1165 Type 2 diabetes mellitus with hyperglycemia: Secondary | ICD-10-CM | POA: Diagnosis not present

## 2015-12-26 NOTE — Progress Notes (Signed)
Diabetes Self-Management Education  Visit Type: First/Initial  Appt. Start Time: 0830 Appt. End Time: 0930  12/26/2015  Allison [name], identified by name and date of birth, is a 81 y.o. female with a diagnosis of Diabetes: Type 2.   ASSESSMENT  Edmonia states she is recovering from a stroke in late October.  Her glucose (A1C) is higher than ever and she is confused about how to manage diabetes and heart health.        Diabetes Self-Management Education - 12/26/15 0836      Visit Information   Visit Type First/Initial     Initial Visit   Diabetes Type Type 2   Are you currently following a meal plan? No   Are you taking your medications as prescribed? Yes     Health Coping   How would you rate your overall health? Good     Psychosocial Assessment   Patient Belief/Attitude about Diabetes Afraid   Self-care barriers --  just had a stroke   Other persons present Patient   Patient Concerns Nutrition/Meal planning;Other (comment)  exercise   Preferred Learning Style No preference indicated   Learning Readiness Ready   How often do you need to have someone help you when you read instructions, pamphlets, or other written materials from your doctor or pharmacy? 1 - Never   What is the last grade level you completed in school? college     Pre-Education Assessment   Patient understands the diabetes disease and treatment process. Needs Instruction   Patient understands incorporating nutritional management into lifestyle. Needs Instruction   Patient undertands incorporating physical activity into lifestyle. Needs Instruction   Patient understands using medications safely. Needs Instruction   Patient understands monitoring blood glucose, interpreting and using results Needs Instruction   Patient understands prevention, detection, and treatment of acute complications. Needs Instruction   Patient understands prevention, detection, and treatment of chronic complications. Needs  Instruction   Patient understands how to develop strategies to address psychosocial issues. Needs Instruction   Patient understands how to develop strategies to promote health/change behavior. Needs Instruction     Complications   Last HgB A1C per patient/outside source 11.2 %   How often do you check your blood sugar? 1-2 times/day   Fasting Blood glucose range (mg/dL) >200   Postprandial Blood glucose range (mg/dL) >200   Number of hypoglycemic episodes per month 0   Have you had a dilated eye exam in the past 12 months? Yes   Have you had a dental exam in the past 12 months? No   Are you checking your feet? Yes   How many days per week are you checking your feet? 7     Dietary Intake   Breakfast scrambled egg with cheese, 2 ritz crackers   Lunch frozen dinner with chicken and gravy and vegetables   Snack (afternoon) PB and crackers   Dinner burger with lettuce and tomato and mayo and chili   Snack (evening) chocolate cookie   Beverage(s) coffee with splenda and 2% milk, glucerna, diet dr pepper, trying to drink water     Exercise   Exercise Type ADL's     Patient Education   Previous Diabetes Education No   Disease state  Explored patient's options for treatment of their diabetes   Nutrition management  Role of diet in the treatment of diabetes and the relationship between the three main macronutrients and blood glucose level   Physical activity and exercise  Role of exercise  on diabetes management, blood pressure control and cardiac health.;Helped patient identify appropriate exercises in relation to his/her diabetes, diabetes complications and other health issue.   Medications Taught/reviewed insulin injection, site rotation, insulin storage and needle disposal.;Reviewed patients medication for diabetes, action, purpose, timing of dose and side effects.   Monitoring Other (comment)  proper disposal of medical sharps     Individualized Goals (developed by patient)   Nutrition  General guidelines for healthy choices and portions discussed   Physical Activity Exercise 3-5 times per week   Medications Other (comment)  talk with PCP about referral to endo for medication management     Post-Education Assessment   Patient understands the diabetes disease and treatment process. Needs Instruction   Patient understands incorporating nutritional management into lifestyle. Needs Review   Patient undertands incorporating physical activity into lifestyle. Needs Review   Patient understands using medications safely. Needs Review   Patient understands monitoring blood glucose, interpreting and using results Needs Review   Patient understands prevention, detection, and treatment of acute complications. Needs Review   Patient understands prevention, detection, and treatment of chronic complications. Needs Review   Patient understands how to develop strategies to address psychosocial issues. Needs Review   Patient understands how to develop strategies to promote health/change behavior. Needs Review     Outcomes   Expected Outcomes Demonstrated interest in learning. Expect positive outcomes   Future DMSE PRN   Program Status Not Completed      Individualized Plan for Diabetes Self-Management Training:   Learning Objective:  Patient will have a greater understanding of diabetes self-management. Patient education plan is to attend individual and/or group sessions per assessed needs and concerns.   Plan:   Patient Instructions  Ask primary care about referral to endocrinologist Take insulin right before eating breakfast and dinner  Plan to walk 3 days/week for 30 minutes 1/4 meal carbohydrate, 1/4 meal protein, 1/2 meal vegetables Drink more water  No juice! glucerna is a snack, not a beverage with food Limit eating out and limit frozen meals because of the salt   Expected Outcomes:  Demonstrated interest in learning. Expect positive outcomes  Education material  provided: Living Well with Diabetes and Meal plan card  If problems or questions, patient to contact team via:  Phone  Future DSME appointment: PRN

## 2015-12-26 NOTE — Patient Instructions (Addendum)
Ask primary care about referral to endocrinologist Take insulin right before eating breakfast and dinner  Plan to walk 3 days/week for 30 minutes 1/4 meal carbohydrate, 1/4 meal protein, 1/2 meal vegetables Drink more water  No juice! glucerna is a snack, not a beverage with food Limit eating out and limit frozen meals because of the salt

## 2015-12-30 ENCOUNTER — Ambulatory Visit: Payer: Medicare Other | Admitting: Pharmacotherapy

## 2016-01-01 ENCOUNTER — Ambulatory Visit (INDEPENDENT_AMBULATORY_CARE_PROVIDER_SITE_OTHER): Payer: Medicare Other | Admitting: Internal Medicine

## 2016-01-01 VITALS — BP 120/78 | HR 70 | Temp 97.9°F | Ht 64.0 in | Wt 143.0 lb

## 2016-01-01 DIAGNOSIS — Z8673 Personal history of transient ischemic attack (TIA), and cerebral infarction without residual deficits: Secondary | ICD-10-CM | POA: Diagnosis not present

## 2016-01-01 DIAGNOSIS — F411 Generalized anxiety disorder: Secondary | ICD-10-CM

## 2016-01-01 DIAGNOSIS — I10 Essential (primary) hypertension: Secondary | ICD-10-CM

## 2016-01-01 DIAGNOSIS — E785 Hyperlipidemia, unspecified: Secondary | ICD-10-CM | POA: Diagnosis not present

## 2016-01-01 DIAGNOSIS — E1142 Type 2 diabetes mellitus with diabetic polyneuropathy: Secondary | ICD-10-CM

## 2016-01-01 DIAGNOSIS — E034 Atrophy of thyroid (acquired): Secondary | ICD-10-CM | POA: Diagnosis not present

## 2016-01-01 DIAGNOSIS — I639 Cerebral infarction, unspecified: Secondary | ICD-10-CM | POA: Diagnosis not present

## 2016-01-01 MED ORDER — CITALOPRAM HYDROBROMIDE 10 MG PO TABS: 10.0000 mg | ORAL_TABLET | Freq: Every day | ORAL | 6 refills | Status: DC

## 2016-01-01 NOTE — Patient Instructions (Addendum)
INCREASE INSULIN TO 14 UNITS 2 TIMES DAILY  RESTART CITALOPRAM FOR ANXIETY AND TAKE IT EVERY DAY  Use cane when up and walking  Continue other medications as ordered  Fasting labs in 1 month  NO DRIVING!!  Follow up in 3 mos for routine visit

## 2016-01-01 NOTE — Progress Notes (Signed)
Patient ID: Allison [name], female   DOB: 1934/09/15, 81 y.o.   MRN: [id number]    Location:  [facility] Place of Service: OFFICE  Chief Complaint  Patient presents with  . Follow-up    Follow-up on anxiety and BS "still roller coasting"   . Medication Management    Patient states she is taking anxiety/mood pill when needed Citalopram 10 mg. Medication was removed from patient's med list at ED visit with the notation " Patient not taking"     HPI:  81 yo female seen today for f/u. She reports feeling tired. She completed home PT. She is a poor historian due to cognitive deficit due to recent stroke. Hx obtained from chart.  HTN - BP stable on chlorthalidone. She checks BP at home.  Hyperlipidemia - LDL 213 (not at goal)/Tchol 290/HDL 41. She has resumed lipitor. No myalgias. She is having strange dreams  hypothyroidism - she takes levothyroxine.TSH 1.885. No constipation or diarrhea but does have increased anxiety/depression, palpitations. She reports weight loss 4 lbs since last OV  DM - uncontrolled with A1c 11.2%. BS fluctuating but improved 100-200 most times and occasional 400 reading. She takes metformin and novolog 70/30 12 units BID. Numbness in fingers. She saw eye specialist in Feb 2017. No diabetic changes seen. 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 Uses magnifying glass to read at times. She reported angioedema with lantus and levemir  GERD - reflux sx's stable on omeprazole  Anxiety - uncontrolled. Off lorazepam. She has been taking citalopram prn  Hx CVA with slurred speech/RLE hemiparesis (dx Oct 2017 - right internal capsule lacunar infarct) - taking plavix daily.  Past Medical History:  Diagnosis Date  . Carpal tunnel syndrome   . Diabetes mellitus without complication ([place])   . Dysphagia   . GERD (gastroesophageal reflux disease)   . Hiatal hernia   . HLD (hyperlipidemia)   . Hx of adenomatous colonic polyps 06/19/2014  . Hypertension   . Hypothyroidism   . Onychomycosis  03/05/2012  . Pain in lower limb 04/18/2013  . Sciatic nerve pain   . Vertigo     Past Surgical History:  Procedure Laterality Date  . BLADDER SURGERY     Mesh implant  . COLONOSCOPY    . LARYNX SURGERY     vocal cords  . PARTIAL HYSTERECTOMY  1978  . VESICOVAGINAL FISTULA CLOSURE W/ TAH  1976    Patient Care Team: Gildardo Cranker, DO as PCP - General (Internal Medicine) Thana Ates, RN as [facility]  Social History   Social History  . Marital status: Single    Spouse name: N/A  . Number of children: 2  . Years of education: N/A   Occupational History  . Retired    Social History Main Topics  . Smoking status: Never Smoker  . Smokeless tobacco: Never Used  . Alcohol use No  . Drug use: No  . Sexual activity: No   Other Topics Concern  . Not on file   Social History Narrative   Single   2 sons   Retired   2 cups caffeine daily   05/01/2014        reports that she has never smoked. She has never used smokeless tobacco. She reports that she does not drink alcohol or use drugs.  Family History  Problem Relation Age of Onset  . Alzheimer's disease Mother   . Glaucoma Mother   . Heart disease Father   . Alzheimer's  disease Father   . Asthma Sister   . Allergies Sister   . Allergies Sister   . Sleep apnea Sister   . Heart disease Sister   . Breast cancer Maternal Aunt   . Pancreatic cancer Maternal Aunt   . Prostate cancer Maternal Uncle   . Prostate cancer Paternal Uncle   . Alcoholism Maternal Uncle   . Kidney disease Maternal Aunt   . Heart disease Maternal Aunt    Family Status  Relation Status  . Mother Deceased  . Father Deceased  . Sister Alive  . Sister Alive  . Son Alive  . Son Alive  . Sister   . Sister   . Maternal Aunt   . Maternal Aunt   . Maternal Uncle   . Paternal Uncle   . Maternal Uncle   . Maternal Aunt   . Maternal Aunt      Allergies  Allergen Reactions  . Lantus [Insulin Glargine]  Itching and Swelling    angioedema  . Levemir [Insulin Detemir] Itching and Swelling    angioedema  . Pineapple Anaphylaxis  . Codeine Other (See Comments)    unknown  . Invokana [Canagliflozin] Other (See Comments)    Constipation, nightmares  . Penicillins Other (See Comments)    Unknown   . Tradjenta [Linagliptin] Other (See Comments)  . Vioxx [Rofecoxib] Other (See Comments)    unknown    Medications: Patient's Medications  New Prescriptions   No medications on file  Previous Medications   ATORVASTATIN (LIPITOR) 40 MG TABLET    take 1 tablet by mouth at bedtime   BETAMETHASONE DIPROPIONATE (DIPROLENE) 0.05 % CREAM    Apply topically 2 (two) times daily.   BLOOD GLUCOSE METER KIT AND SUPPLIES KIT    Check blood glucose twice daily before insulin therapy (ICD-10 E11.21, E11.65).   CHLORTHALIDONE (HYGROTON) 25 MG TABLET    Take 0.5 tablets (12.5 mg total) by mouth daily.   CITALOPRAM (CELEXA) 10 MG TABLET    Take 10 mg by mouth as needed. Patient is taking off/on   CLOPIDOGREL (PLAVIX) 75 MG TABLET    Take 1 tablet (75 mg total) by mouth daily.   GABAPENTIN (NEURONTIN) 100 MG CAPSULE    Take 200 mg by mouth 3 (three) times daily.   GLUCOSE BLOOD (ONE TOUCH ULTRA TEST) TEST STRIP    Use to test blood sugar three times daily. Dx: E11.21   INSULIN ASPART (NOVOLOG FLEXPEN) 100 UNIT/ML FLEXPEN    Inject 12 Units into the skin 2 (two) times daily.   LEVOTHYROXINE (SYNTHROID, LEVOTHROID) 150 MCG TABLET    Take 1 tablet (150 mcg total) by mouth every morning. For thyroid   METFORMIN (GLUCOPHAGE) 500 MG TABLET    Take 2 tablets (1,000 mg total) by mouth 2 (two) times daily with a meal.   OMEPRAZOLE (PRILOSEC) 20 MG CAPSULE    take 1 capsule by mouth once daily   ONETOUCH DELICA LANCETS 86L MISC    Use to test blood sugar three times daily. Dx: E11.21   VITAMIN E (VITAMIN E) 1000 UNIT CAPSULE    Take 1,000 Units by mouth daily.  Modified Medications   No medications on file  Discontinued  Medications   INSULIN ASPART PROTAMINE- ASPART (NOVOLOG MIX 70/30) (70-30) 100 UNIT/ML INJECTION    Inject 0.12 mLs (12 Units total) into the skin 2 (two) times daily with a meal.    Review of Systems  Unable to perform ROS: Other (cognitive deficit)  Vitals:   01/01/16 1155  BP: 120/78  Pulse: 70  Temp: 97.9 F (36.6 C)  TempSrc: Oral  SpO2: 98%  Weight: 143 lb (64.9 kg)  Height: '5\' 4"'  (1.626 m)   Body mass index is 24.55 kg/m.  Physical Exam  Constitutional: She is oriented to person, place, and time. She appears well-developed and well-nourished. No distress.  HENT:  Mouth/Throat: Oropharynx is clear and moist. No oropharyngeal exudate.  Eyes: Pupils are equal, round, and reactive to light. No scleral icterus.  Neck: Neck supple. Carotid bruit is not present. No tracheal deviation present. No thyromegaly present.  Cardiovascular: Normal rate, regular rhythm and intact distal pulses.  Exam reveals no gallop and no friction rub.   Murmur (1/6 SEM) heard. Trace LE edema b/l. no calf TTP.   Pulmonary/Chest: Effort normal and breath sounds normal. No stridor. No respiratory distress. She has no wheezes. She has no rales.  Abdominal: Soft. Bowel sounds are normal. She exhibits no distension and no mass. There is no hepatomegaly. There is no tenderness. There is no rebound and no guarding.  Musculoskeletal: She exhibits edema.  Lymphadenopathy:    She has no cervical adenopathy.  Neurological: She is alert and oriented to person, place, and time.  Right facial droop; speech slurred; RLE hemiparesis (strength 4/5); gait unsteady  Skin: Skin is warm and dry. No rash noted.  Left lateral malleolus lipoma egg sized, NT  Psychiatric: She has a normal mood and affect. Her behavior is normal. Thought content normal.     Labs reviewed: Admission on 10/28/2015, Discharged on 11/01/2015  Component Date Value Ref Range Status  . Glucose-Capillary 10/28/2015 277* 65 - 99 mg/dL Final    . Sodium 10/28/2015 135  135 - 145 mmol/L Final  . Potassium 10/28/2015 3.5  3.5 - 5.1 mmol/L Final  . Chloride 10/28/2015 98* 101 - 111 mmol/L Final  . CO2 10/28/2015 27  22 - 32 mmol/L Final  . Glucose, Bld 10/28/2015 293* 65 - 99 mg/dL Final  . BUN 10/28/2015 9  6 - 20 mg/dL Final  . Creatinine, Ser 10/28/2015 0.77  0.44 - 1.00 mg/dL Final  . Calcium 10/28/2015 9.9  8.9 - 10.3 mg/dL Final  . GFR calc non Af Amer 10/28/2015 >60  >60 mL/min Final  . GFR calc Af Amer 10/28/2015 >60  >60 mL/min Final   Comment: (NOTE) The eGFR has been calculated using the CKD EPI equation. This calculation has not been validated in all clinical situations. eGFR's persistently <60 mL/min signify possible Chronic Kidney Disease.   . Anion gap 10/28/2015 10  5 - 15 Final  . WBC 10/28/2015 10.1  4.0 - 10.5 K/uL Final  . RBC 10/28/2015 4.85  3.87 - 5.11 MIL/uL Final  . Hemoglobin 10/28/2015 13.1  12.0 - 15.0 g/dL Final  . HCT 10/28/2015 38.5  36.0 - 46.0 % Final  . MCV 10/28/2015 79.4  78.0 - 100.0 fL Final  . MCH 10/28/2015 27.0  26.0 - 34.0 pg Final  . MCHC 10/28/2015 34.0  30.0 - 36.0 g/dL Final  . RDW 10/28/2015 13.4  11.5 - 15.5 % Final  . Platelets 10/28/2015 252  150 - 400 K/uL Final  . Color, Urine 10/28/2015 AMBER* YELLOW Final  . APPearance 10/28/2015 CLEAR  CLEAR Final  . Specific Gravity, Urine 10/28/2015 1.020  1.005 - 1.030 Final  . pH 10/28/2015 7.5  5.0 - 8.0 Final  . Glucose, UA 10/28/2015 >1000* NEGATIVE mg/dL Final  . Hgb urine dipstick  10/28/2015 NEGATIVE  NEGATIVE Final  . Bilirubin Urine 10/28/2015 NEGATIVE  NEGATIVE Final  . Ketones, ur 10/28/2015 NEGATIVE  NEGATIVE mg/dL Final  . Protein, ur 10/28/2015 NEGATIVE  NEGATIVE mg/dL Final  . Nitrite 10/28/2015 NEGATIVE  NEGATIVE Final  . Leukocytes, UA 10/28/2015 SMALL* NEGATIVE Final  . Glucose-Capillary 10/28/2015 304* 65 - 99 mg/dL Final  . Comment 1 10/28/2015 Notify RN   Final  . Comment 2 10/28/2015 Document in Chart    Final  . Squamous Epithelial / LPF 10/28/2015 0-5* NONE SEEN Final  . WBC, UA 10/28/2015 0-5  0 - 5 WBC/hpf Final  . RBC / HPF 10/28/2015 0-5  0 - 5 RBC/hpf Final  . Bacteria, UA 10/28/2015 RARE* NONE SEEN Final  . TSH 10/28/2015 1.885  0.350 - 4.500 uIU/mL Final  . Glucose-Capillary 10/28/2015 295* 65 - 99 mg/dL Final  . Hgb A1c MFr Bld 10/29/2015 11.2* 4.8 - 5.6 % Final   Comment: (NOTE)         Pre-diabetes: 5.7 - 6.4         Diabetes: >6.4         Glycemic control for adults with diabetes: <7.0   . Mean Plasma Glucose 10/29/2015 275  mg/dL Final   Comment: (NOTE) Performed At: [facility] [place], [place] [id number] Lindon Romp MD [id number]   . Weight 10/31/2015 2286.4  oz Final  . Height 10/31/2015 64  in Final  . BP 10/31/2015 108/54  mmHg Final  . Right CCA prox sys 10/31/2015 78  cm/s Final  . Right CCA prox dias 10/31/2015 11  cm/s Final  . Right cca dist sys 10/31/2015 -81  cm/s Final  . Left CCA prox sys 10/31/2015 83  cm/s Final  . Left CCA prox dias 10/31/2015 13  cm/s Final  . Left CCA dist sys 10/31/2015 79  cm/s Final  . Left CCA dist dias 10/31/2015 16  cm/s Final  . Left ICA prox sys 10/31/2015 -70  cm/s Final  . Left ICA prox dias 10/31/2015 -17  cm/s Final  . Left ICA dist sys 10/31/2015 -72  cm/s Final  . Left ICA dist dias 10/31/2015 -18  cm/s Final  . RIGHT ECA DIAS 10/31/2015 -1.00  cm/s Final  . RIGHT VERTEBRAL DIAS 10/31/2015 7.00  cm/s Final  . LEFT ECA DIAS 10/31/2015 -7.00  cm/s Final  . LEFT VERTEBRAL DIAS 10/31/2015 8.00  cm/s Final  . Vitamin B-12 10/28/2015 630  180 - 914 pg/mL Final   Comment: (NOTE) This assay is not validated for testing neonatal or myeloproliferative syndrome specimens for Vitamin B12 levels.   . Folate, Hemolysate 10/29/2015 426.8  Not Estab. ng/mL Final  . Hematocrit 10/29/2015 41.2  34.0 - 46.6 % Final  . Folate, RBC 10/29/2015 1036  >498 ng/mL Final   Comment: (NOTE) Performed At:  [facility] [place], [place] [id number] Lindon Romp MD [id number]   . Glucose-Capillary 10/28/2015 244* 65 - 99 mg/dL Final  . Cholesterol 10/29/2015 290* 0 - 200 mg/dL Final  . Triglycerides 10/29/2015 180* <150 mg/dL Final  . HDL 10/29/2015 41  >40 mg/dL Final  . Total CHOL/HDL Ratio 10/29/2015 7.1  RATIO Final  . VLDL 10/29/2015 36  0 - 40 mg/dL Final  . LDL Cholesterol 10/29/2015 213* 0 - 99 mg/dL Final   Comment:        Total Cholesterol/HDL:CHD Risk Coronary Heart Disease Risk Table  Men   Women  1/2 Average Risk   3.4   3.3  Average Risk       5.0   4.4  2 X Average Risk   9.6   7.1  3 X Average Risk  23.4   11.0        Use the calculated Patient Ratio above and the CHD Risk Table to determine the patient's CHD Risk.        ATP III CLASSIFICATION (LDL):  <100     mg/dL   Optimal  100-129  mg/dL   Near or Above                    Optimal  130-159  mg/dL   Borderline  160-189  mg/dL   High  >190     mg/dL   Very High   . Glucose-Capillary 10/28/2015 218* 65 - 99 mg/dL Final  . Glucose-Capillary 10/28/2015 314* 65 - 99 mg/dL Final  . Comment 1 10/28/2015 Notify RN   Final  . Comment 2 10/28/2015 Document in Chart   Final  . Glucose-Capillary 10/29/2015 242* 65 - 99 mg/dL Final  . Comment 1 10/29/2015 Notify RN   Final  . Comment 2 10/29/2015 Document in Chart   Final  . Glucose-Capillary 10/29/2015 261* 65 - 99 mg/dL Final  . Glucose-Capillary 10/29/2015 272* 65 - 99 mg/dL Final  . Sodium 10/30/2015 138  135 - 145 mmol/L Final  . Potassium 10/30/2015 3.3* 3.5 - 5.1 mmol/L Final  . Chloride 10/30/2015 103  101 - 111 mmol/L Final  . CO2 10/30/2015 26  22 - 32 mmol/L Final  . Glucose, Bld 10/30/2015 130* 65 - 99 mg/dL Final  . BUN 10/30/2015 11  6 - 20 mg/dL Final  . Creatinine, Ser 10/30/2015 0.96  0.44 - 1.00 mg/dL Final  . Calcium 10/30/2015 9.8  8.9 - 10.3 mg/dL Final  . GFR calc non Af Amer 10/30/2015 55* >60  mL/min Final  . GFR calc Af Amer 10/30/2015 >60  >60 mL/min Final   Comment: (NOTE) The eGFR has been calculated using the CKD EPI equation. This calculation has not been validated in all clinical situations. eGFR's persistently <60 mL/min signify possible Chronic Kidney Disease.   . Anion gap 10/30/2015 9  5 - 15 Final  . WBC 10/30/2015 10.1  4.0 - 10.5 K/uL Final  . RBC 10/30/2015 4.91  3.87 - 5.11 MIL/uL Final  . Hemoglobin 10/30/2015 12.9  12.0 - 15.0 g/dL Final  . HCT 10/30/2015 38.8  36.0 - 46.0 % Final  . MCV 10/30/2015 79.0  78.0 - 100.0 fL Final  . MCH 10/30/2015 26.3  26.0 - 34.0 pg Final  . MCHC 10/30/2015 33.2  30.0 - 36.0 g/dL Final  . RDW 10/30/2015 13.7  11.5 - 15.5 % Final  . Platelets 10/30/2015 242  150 - 400 K/uL Final  . Glucose-Capillary 10/29/2015 150* 65 - 99 mg/dL Final  . Comment 1 10/29/2015 Notify RN   Final  . Comment 2 10/29/2015 Document in Chart   Final  . Glucose-Capillary 10/30/2015 155* 65 - 99 mg/dL Final  . Comment 1 10/30/2015 Notify RN   Final  . Comment 2 10/30/2015 Document in Chart   Final  . Glucose-Capillary 10/30/2015 234* 65 - 99 mg/dL Final  . Glucose-Capillary 10/30/2015 208* 65 - 99 mg/dL Final  . Glucose-Capillary 10/30/2015 151* 65 - 99 mg/dL Final  . Glucose-Capillary 10/31/2015 230* 65 - 99 mg/dL Final  .  Comment 1 10/31/2015 Notify RN   Final  . Comment 2 10/31/2015 Document in Chart   Final  . Glucose-Capillary 10/31/2015 273* 65 - 99 mg/dL Final  . Glucose-Capillary 10/31/2015 211* 65 - 99 mg/dL Final  . Glucose-Capillary 10/31/2015 203* 65 - 99 mg/dL Final  . Glucose-Capillary 11/01/2015 178* 65 - 99 mg/dL Final  . Comment 1 11/01/2015 Notify RN   Final  . Comment 2 11/01/2015 Document in Chart   Final  . Glucose-Capillary 11/01/2015 230* 65 - 99 mg/dL Final  Admission on 10/26/2015, Discharged on 10/26/2015  Component Date Value Ref Range Status  . WBC 10/26/2015 7.7  4.0 - 10.5 K/uL Final  . RBC 10/26/2015 5.09  3.87 -  5.11 MIL/uL Final  . Hemoglobin 10/26/2015 13.9  12.0 - 15.0 g/dL Final  . HCT 10/26/2015 40.8  36.0 - 46.0 % Final  . MCV 10/26/2015 80.2  78.0 - 100.0 fL Final  . MCH 10/26/2015 27.3  26.0 - 34.0 pg Final  . MCHC 10/26/2015 34.1  30.0 - 36.0 g/dL Final  . RDW 10/26/2015 13.8  11.5 - 15.5 % Final  . Platelets 10/26/2015 248  150 - 400 K/uL Final  . Neutrophils Relative % 10/26/2015 54  % Final  . Neutro Abs 10/26/2015 4.2  1.7 - 7.7 K/uL Final  . Lymphocytes Relative 10/26/2015 30  % Final  . Lymphs Abs 10/26/2015 2.3  0.7 - 4.0 K/uL Final  . Monocytes Relative 10/26/2015 12  % Final  . Monocytes Absolute 10/26/2015 0.9  0.1 - 1.0 K/uL Final  . Eosinophils Relative 10/26/2015 3  % Final  . Eosinophils Absolute 10/26/2015 0.2  0.0 - 0.7 K/uL Final  . Basophils Relative 10/26/2015 1  % Final  . Basophils Absolute 10/26/2015 0.0  0.0 - 0.1 K/uL Final  . Sodium 10/26/2015 133* 135 - 145 mmol/L Final  . Potassium 10/26/2015 4.1  3.5 - 5.1 mmol/L Final  . Chloride 10/26/2015 94* 101 - 111 mmol/L Final  . CO2 10/26/2015 30  22 - 32 mmol/L Final  . Glucose, Bld 10/26/2015 600* 65 - 99 mg/dL Final   Comment: RESULTS CONFIRMED BY MANUAL DILUTION CRITICAL RESULT CALLED TO, READ BACK BY AND VERIFIED WITH: MAYNARD,C AT 2228 ON [id number] BY CHERESNOWSKY,T   . BUN 10/26/2015 12  6 - 20 mg/dL Final  . Creatinine, Ser 10/26/2015 1.16* 0.44 - 1.00 mg/dL Final  . Calcium 10/26/2015 9.8  8.9 - 10.3 mg/dL Final  . GFR calc non Af Amer 10/26/2015 44* >60 mL/min Final  . GFR calc Af Amer 10/26/2015 51* >60 mL/min Final   Comment: (NOTE) The eGFR has been calculated using the CKD EPI equation. This calculation has not been validated in all clinical situations. eGFR's persistently <60 mL/min signify possible Chronic Kidney Disease.   . Anion gap 10/26/2015 9  5 - 15 Final  . Color, Urine 10/26/2015 YELLOW  YELLOW Final  . APPearance 10/26/2015 CLEAR  CLEAR Final  . Specific Gravity, Urine 10/26/2015  1.028  1.005 - 1.030 Final  . pH 10/26/2015 5.5  5.0 - 8.0 Final  . Glucose, UA 10/26/2015 >1000* NEGATIVE mg/dL Final  . Hgb urine dipstick 10/26/2015 NEGATIVE  NEGATIVE Final  . Bilirubin Urine 10/26/2015 NEGATIVE  NEGATIVE Final  . Ketones, ur 10/26/2015 NEGATIVE  NEGATIVE mg/dL Final  . Protein, ur 10/26/2015 NEGATIVE  NEGATIVE mg/dL Final  . Nitrite 10/26/2015 NEGATIVE  NEGATIVE Final  . Leukocytes, UA 10/26/2015 NEGATIVE  NEGATIVE Final  . pH, Ven 10/26/2015  7.386  7.250 - 7.430 Final  . pCO2, Ven 10/26/2015 58.0  44.0 - 60.0 mmHg Final  . pO2, Ven 10/26/2015 19.0* 32.0 - 45.0 mmHg Final  . Bicarbonate 10/26/2015 34.9* 20.0 - 28.0 mmol/L Final  . TCO2 10/26/2015 37  0 - 100 mmol/L Final  . O2 Saturation 10/26/2015 28.0  % Final  . Acid-Base Excess 10/26/2015 8.0* 0.0 - 2.0 mmol/L Final  . Patient temperature 10/26/2015 98.1 F   Final  . Collection site 10/26/2015 IV START   Final  . Drawn by 10/26/2015 RT   Final  . Sample type 10/26/2015 VENOUS   Final  . Comment 10/26/2015 NOTIFIED PHYSICIAN   Final  . Squamous Epithelial / LPF 10/26/2015 0-5* NONE SEEN Final  . WBC, UA 10/26/2015 0-5  0 - 5 WBC/hpf Final  . RBC / HPF 10/26/2015 0-5  0 - 5 RBC/hpf Final  . Bacteria, UA 10/26/2015 RARE* NONE SEEN Final  . Glucose-Capillary 10/26/2015 459* 65 - 99 mg/dL Final  . Glucose-Capillary 10/27/2015 563* 65 - 99 mg/dL Final  . Comment 1 10/27/2015 Notify RN   Final  . Comment 2 10/27/2015 Document in Chart   Final  Admission on 10/06/2015, Discharged on 10/06/2015  Component Date Value Ref Range Status  . Sodium 10/06/2015 139  135 - 145 mmol/L Final  . Potassium 10/06/2015 3.9  3.5 - 5.1 mmol/L Final  . Chloride 10/06/2015 101  101 - 111 mmol/L Final  . CO2 10/06/2015 28  22 - 32 mmol/L Final  . Glucose, Bld 10/06/2015 306* 65 - 99 mg/dL Final  . BUN 10/06/2015 <5* 6 - 20 mg/dL Final  . Creatinine, Ser 10/06/2015 0.74  0.44 - 1.00 mg/dL Final  . Calcium 10/06/2015 9.5  8.9 - 10.3  mg/dL Final  . GFR calc non Af Amer 10/06/2015 >60  >60 mL/min Final  . GFR calc Af Amer 10/06/2015 >60  >60 mL/min Final   Comment: (NOTE) The eGFR has been calculated using the CKD EPI equation. This calculation has not been validated in all clinical situations. eGFR's persistently <60 mL/min signify possible Chronic Kidney Disease.   . Anion gap 10/06/2015 10  5 - 15 Final  . WBC 10/06/2015 7.3  4.0 - 10.5 K/uL Final  . RBC 10/06/2015 4.92  3.87 - 5.11 MIL/uL Final  . Hemoglobin 10/06/2015 13.2  12.0 - 15.0 g/dL Final  . HCT 10/06/2015 40.0  36.0 - 46.0 % Final  . MCV 10/06/2015 81.3  78.0 - 100.0 fL Final  . MCH 10/06/2015 26.8  26.0 - 34.0 pg Final  . MCHC 10/06/2015 33.0  30.0 - 36.0 g/dL Final  . RDW 10/06/2015 13.4  11.5 - 15.5 % Final  . Platelets 10/06/2015 284  150 - 400 K/uL Final  . Glucose-Capillary 10/06/2015 286* 65 - 99 mg/dL Final  . Glucose-Capillary 10/06/2015 269* 65 - 99 mg/dL Final    No results found.   Assessment/Plan   ICD-9-CM ICD-10-CM   1. History of recent stroke V12.54 Z86.73 CMP with eGFR     CANCELED: CMP with eGFR  2. Anxiety state 300.00 F41.1 CMP with eGFR     CANCELED: CMP with eGFR  3. Diabetic polyneuropathy associated with type 2 diabetes mellitus ([place]) 250.60 E11.42 Microalbumin/Creatinine Ratio, Urine   357.2  Hemoglobin A1c     CANCELED: CMP with eGFR     CANCELED: Hemoglobin A1c     CANCELED: Microalbumin/Creatinine Ratio, Urine  4. Essential hypertension 401.9 I10 CMP with eGFR  5. Hyperlipidemia LDL goal <100 272.4 E78.5 Lipid Panel     CANCELED: Lipid Panel  6. Hypothyroidism due to acquired atrophy of thyroid 244.8 E03.4 TSH   246.8  CANCELED: TSH   INCREASE INSULIN TO 14 UNITS 2 TIMES DAILY  RESTART CITALOPRAM FOR ANXIETY AND TAKE IT EVERY DAY  Use cane when up and walking  Continue other medications as ordered  Fasting labs in 1 month  NO DRIVING!!  Follow up in 3 mos for routine visit  Saharra Santo S. Perlie Gold  [facility] [address] [place], [place] [address] [phone] Cell (Monday-Friday 8 AM - 5 PM) [phone] After 5 PM and follow prompts

## 2016-01-02 ENCOUNTER — Encounter: Payer: Self-pay | Admitting: Internal Medicine

## 2016-01-09 ENCOUNTER — Ambulatory Visit: Payer: Medicare Other | Admitting: Skilled Nursing Facility1

## 2016-01-09 ENCOUNTER — Other Ambulatory Visit: Payer: Self-pay

## 2016-01-09 NOTE — Patient Outreach (Signed)
Telephone follow up: Patient reports that she is doing well. Reports that she saw her primary MD and her insulin was increased to 14 units twice a day. Reports that her fasting CBG today was 212.  Patient reports that she met with dietician and understand more about what she needs to eat. Reports pending labs in 1 month to reassess A1c.     Patient states that she needs to start exercising but struggles with motivation.   PLAN: reviewed with patient her goals. Patient states that she does not need home visits any longer. Offered health coach for ongoing DM management and patient has accepted.   Placed order for health coach. Will send MD update letter about transfer to health coach.  [facility] CM Care Plan Problem One   Flowsheet Row Most Recent Value  Care Plan Problem One  Recent admission for stroke and uncontrolled DM  Role Documenting the Problem One  Care Management [place] for Problem One  Active  [name] (31-90 days)  Patient will report decrease in Hgb A1c in the next 90 days.  [name] Start Date  11/04/15  Interventions for Problem One Long Term Goal  Encouraged patient to eat correctly and take all medication as prescribed. Reviewed importance of daily exercising.   [facility] CM Short Term Goal #1 (0-30 days)  Patient will report recording CBG levels daily for the next 30 days.   [facility] CM Short Term Goal #1 Start Date  11/04/15  [facility] CM Short Term Goal #1 Met Date  12/12/15  Interventions for Short Term Goal #1  Encouraged patient to continue to montior and record CBG readings.  [facility] CM Short Term Goal #2 (0-30 days)  Patient will report hospital follow up with primary MD in the next 10 days.   [facility] CM Short Term Goal #2 Start Date  11/04/15  [facility] CM Short Term Goal #2 Met Date  11/21/15  Interventions for Short Term Goal #2  Reviewed importance of attending follow up MD appointment.  [facility] CM Short Term Goal #3 (0-30 days)  Patient will read and reviewed DM  educational materials within the next 7 days.  [facility] CM Short Term Goal #3 Start Date  12/03/15 [date reset. pending mailed material.]  [facility] CM Short Term Goal #3 Met Date  12/12/15  Interventions for Short Tern Goal #3  Encouraged pateint to follow DM diet. Reviewed good snack choices.     Tomasa Rand, RN, BSN, CEN [facility] [employer] [phone]

## 2016-01-16 ENCOUNTER — Other Ambulatory Visit: Payer: Self-pay | Admitting: Internal Medicine

## 2016-01-16 DIAGNOSIS — E118 Type 2 diabetes mellitus with unspecified complications: Secondary | ICD-10-CM

## 2016-01-16 MED ORDER — CLOPIDOGREL BISULFATE 75 MG PO TABS: 75.0000 mg | ORAL_TABLET | Freq: Every day | ORAL | 3 refills | Status: DC

## 2016-01-20 ENCOUNTER — Ambulatory Visit: Payer: Medicare Other | Admitting: Pharmacotherapy

## 2016-01-22 ENCOUNTER — Encounter: Payer: Self-pay | Admitting: Internal Medicine

## 2016-01-22 ENCOUNTER — Ambulatory Visit (INDEPENDENT_AMBULATORY_CARE_PROVIDER_SITE_OTHER): Payer: Medicare Other | Admitting: Internal Medicine

## 2016-01-22 DIAGNOSIS — J22 Unspecified acute lower respiratory infection: Secondary | ICD-10-CM | POA: Diagnosis not present

## 2016-01-22 MED ORDER — AZITHROMYCIN 250 MG PO TABS: ORAL_TABLET | ORAL | 0 refills | Status: DC

## 2016-01-22 MED ORDER — DM-GUAIFENESIN ER 30-600 MG PO TB12: ORAL_TABLET | ORAL | 1 refills | Status: DC

## 2016-01-22 NOTE — Progress Notes (Signed)
Facility  [facility]    Place of Service:   OFFICE    Allergies  Allergen Reactions  . Lantus [Insulin Glargine] Itching and Swelling    angioedema  . Levemir [Insulin Detemir] Itching and Swelling    angioedema  . Pineapple Anaphylaxis  . Codeine Other (See Comments)    unknown  . Invokana [Canagliflozin] Other (See Comments)    Constipation, nightmares  . Penicillins Other (See Comments)    Unknown   . Tradjenta [Linagliptin] Other (See Comments)  . Vioxx [Rofecoxib] Other (See Comments)    unknown    Chief Complaint  Patient presents with  . Acute Visit    cough started 01/20/16, runny nose. Last night and this morning spells of hallucinations.     HPI:  Cough and rhinorrhea since 01/20/16. No fever.  Hallucinations last night. Saw here mother who is dead standing at the sink.   Hospitalized for CVA of the right internal capsule on 11/01/15. Was worried that current illness could cause another stroke.  Medications: Patient's Medications  New Prescriptions   No medications on file  Previous Medications   ATORVASTATIN (LIPITOR) 40 MG TABLET    take 1 tablet by mouth at bedtime   BETAMETHASONE DIPROPIONATE (DIPROLENE) 0.05 % CREAM    Apply topically 2 (two) times daily.   BLOOD GLUCOSE METER KIT AND SUPPLIES KIT    Check blood glucose twice daily before insulin therapy (ICD-10 E11.21, E11.65).   CHLORTHALIDONE (HYGROTON) 25 MG TABLET    Take 0.5 tablets (12.5 mg total) by mouth daily.   CITALOPRAM (CELEXA) 10 MG TABLET    Take 1 tablet (10 mg total) by mouth daily. For anxiety   CLOPIDOGREL (PLAVIX) 75 MG TABLET    Take 1 tablet (75 mg total) by mouth daily.   GABAPENTIN (NEURONTIN) 100 MG CAPSULE    Take 200 mg by mouth 3 (three) times daily.   GLUCOSE BLOOD (ONE TOUCH ULTRA TEST) TEST STRIP    Use to test blood sugar three times daily. Dx: E11.21   INSULIN ASPART (NOVOLOG FLEXPEN) 100 UNIT/ML FLEXPEN    Inject 14 Units into the skin 2 (two) times daily.   LEVOTHYROXINE (SYNTHROID, LEVOTHROID) 150 MCG TABLET    Take 1 tablet (150 mcg total) by mouth every morning. For thyroid   METFORMIN (GLUCOPHAGE) 500 MG TABLET    take 2 tablets by mouth twice a day with food   OMEPRAZOLE (PRILOSEC) 20 MG CAPSULE    take 1 capsule by mouth once daily   ONETOUCH DELICA LANCETS 64W MISC    Use to test blood sugar three times daily. Dx: E11.21   VITAMIN E (VITAMIN E) 1000 UNIT CAPSULE    Take 1,000 Units by mouth daily.  Modified Medications   No medications on file  Discontinued Medications   No medications on file    Review of Systems  Constitutional: Positive for unexpected weight change.  HENT: Positive for drooling and rhinorrhea.   Respiratory: Positive for cough.   Cardiovascular: Positive for palpitations.  Gastrointestinal: Negative.   Endocrine:       DM. Hypothyrpoid.  Genitourinary:       Hx bladder surgery at [facility].  Musculoskeletal: Positive for arthralgias and joint swelling.  Skin: Positive for rash.       itchy dry scalp  Neurological:       Lacunar infarct Oct 2017  Psychiatric/Behavioral: Positive for dysphoric mood, hallucinations (Visual hallucination of her mother 01/21/16.) and sleep disturbance.  The patient is nervous/anxious.   All other systems reviewed and are negative.   Vitals:   01/22/16 1456  BP: 140/78  Pulse: 92  Temp: 98.5 F (36.9 C)  TempSrc: Oral  SpO2: 98%  Weight: 145 lb (65.8 kg)  Height: _0  (1.626 m)   Body mass index is 24.89 kg/m. Wt Readings from Last 3 Encounters:  01/22/16 145 lb (65.8 kg)  01/01/16 143 lb (64.9 kg)  12/24/15 139 lb (63 kg)      Physical Exam  Constitutional: She is oriented to person, place, and time. She appears well-developed and well-nourished. No distress.  HENT:  Mouth/Throat: Oropharynx is clear and moist. No oropharyngeal exudate.  Head congestion  Eyes: Pupils are equal, round, and reactive to light. No scleral icterus.  Neck: Neck supple.  Carotid bruit is not present. No tracheal deviation present. No thyromegaly present.  Cardiovascular: Normal rate, regular rhythm and intact distal pulses.  Exam reveals no gallop and no friction rub.   Murmur (1/6 SEM) heard. Trace LE edema b/l. no calf TTP.   Pulmonary/Chest: Effort normal and breath sounds normal. No stridor. No respiratory distress. She has no wheezes. She has no rales.  Cough with bronchial rattle.  Abdominal: Soft. Bowel sounds are normal. She exhibits no distension and no mass. There is no hepatomegaly. There is no tenderness. There is no rebound and no guarding.  Musculoskeletal: She exhibits edema.  Lymphadenopathy:    She has no cervical adenopathy.  Neurological: She is alert and oriented to person, place, and time.  Right facial droop; speech slurred; RLE hemiparesis (strength 4/5); gait unsteady  Skin: Skin is warm and dry. No rash noted.  Left lateral malleolus lipoma egg sized, NT  Psychiatric: She has a normal mood and affect. Her behavior is normal. Thought content normal.    Labs reviewed: Lab Summary Latest Ref Rng & Units 10/30/2015 10/29/2015 10/28/2015 10/28/2015  Hemoglobin 12.0 - 15.0 g/dL 12.9 (None) 13.1 (None)  Hematocrit 36.0 - 46.0 % 38.8 (None) 41.2 38.5  White count 4.0 - 10.5 K/uL 10.1 (None) 10.1 (None)  Platelet count 150 - 400 K/uL 242 (None) 252 (None)  Sodium 135 - 145 mmol/L 138 (None) 135 (None)  Potassium 3.5 - 5.1 mmol/L 3.3(L) (None) 3.5 (None)  Calcium 8.9 - 10.3 mg/dL 9.8 (None) 9.9 (None)  Phosphorus - (None) (None) (None) (None)  Creatinine 0.44 - 1.00 mg/dL 0.96 (None) 0.77 (None)  AST - (None) (None) (None) (None)  Alk Phos - (None) (None) (None) (None)  Bilirubin - (None) (None) (None) (None)  Glucose 65 - 99 mg/dL 130(H) (None) 293(H) (None)  Cholesterol 0 - 200 mg/dL (None) 290(H) (None) (None)  HDL cholesterol >40 mg/dL (None) 41 (None) (None)  Triglycerides <150 mg/dL (None) 180(H) (None) (None)  LDL Direct -  (None) (None) (None) (None)  LDL Calc 0 - 99 mg/dL (None) 213(H) (None) (None)  Total protein - (None) (None) (None) (None)  Albumin - (None) (None) (None) (None)  Some recent data might be hidden   Lab Results  Component Value Date   TSH 1.885 10/28/2015   TSH 0.35 (L) 09/03/2015   TSH 3.470 05/22/2015   Lab Results  Component Value Date   BUN 11 10/30/2015   BUN 9 10/28/2015   BUN 12 10/26/2015   Lab Results  Component Value Date   HGBA1C 11.2 (H) 10/28/2015   HGBA1C 10.5 (H) 09/03/2015   HGBA1C 9.3 (H) 05/22/2015    Assessment/Plan  1. ARI (acute respiratory  infection) - azithromycin (ZITHROMAX) 250 MG tablet; 2 initially, then 1 daily for infection  Dispense: 6 tablet; Refill: 0 - dextromethorphan-guaiFENesin (MUCINEX DM) 30-600 MG 12hr tablet; One twice daily to suppress cough and congestion  Dispense: 20 tablet; Refill: 1

## 2016-01-23 ENCOUNTER — Other Ambulatory Visit: Payer: Self-pay

## 2016-01-23 ENCOUNTER — Ambulatory Visit: Payer: Medicare Other | Admitting: Nurse Practitioner

## 2016-01-23 DIAGNOSIS — E1110 Type 2 diabetes mellitus with ketoacidosis without coma: Secondary | ICD-10-CM

## 2016-01-23 DIAGNOSIS — Z794 Long term (current) use of insulin: Principal | ICD-10-CM

## 2016-01-23 NOTE — Patient Outreach (Signed)
[facility]) Care Management  01/23/2016  DIANDRIA MAS [birth year] [id number]  Initial telephonic assessment for patient referred by community nurse for diabetic education and support.  Patient reports her fasting cbgs are averaging in the 200s.  She reports some difficulties adhering to diabetic dietary guidelines.  Plan:  Patient will keep PCP appointment scheduled for later in January           Patient will check and record daily cbgs.           Patient will decrease carbohydrate intake.           RN will follow up in February.  Candie Mile, RN, MSN [place] [phone] Fax [phone]

## 2016-01-28 ENCOUNTER — Emergency Department (HOSPITAL_COMMUNITY): Payer: Medicare Other

## 2016-01-28 ENCOUNTER — Ambulatory Visit: Payer: Medicare Other

## 2016-01-28 ENCOUNTER — Encounter (HOSPITAL_COMMUNITY): Payer: Self-pay

## 2016-01-28 ENCOUNTER — Emergency Department (HOSPITAL_COMMUNITY)
Admission: EM | Admit: 2016-01-28 | Discharge: 2016-01-28 | Disposition: A | Discharge status: Home / Self Care | Payer: Medicare Other | Attending: Emergency Medicine | Admitting: Emergency Medicine

## 2016-01-28 ENCOUNTER — Encounter: Payer: Self-pay | Admitting: Internal Medicine

## 2016-01-28 DIAGNOSIS — R05 Cough: Secondary | ICD-10-CM | POA: Diagnosis not present

## 2016-01-28 DIAGNOSIS — R42 Dizziness and giddiness: Secondary | ICD-10-CM | POA: Diagnosis not present

## 2016-01-28 DIAGNOSIS — I10 Essential (primary) hypertension: Secondary | ICD-10-CM | POA: Diagnosis not present

## 2016-01-28 DIAGNOSIS — E1140 Type 2 diabetes mellitus with diabetic neuropathy, unspecified: Secondary | ICD-10-CM | POA: Insufficient documentation

## 2016-01-28 DIAGNOSIS — R55 Syncope and collapse: Secondary | ICD-10-CM | POA: Diagnosis not present

## 2016-01-28 DIAGNOSIS — E039 Hypothyroidism, unspecified: Secondary | ICD-10-CM | POA: Diagnosis not present

## 2016-01-28 DIAGNOSIS — R404 Transient alteration of awareness: Secondary | ICD-10-CM | POA: Diagnosis not present

## 2016-01-28 DIAGNOSIS — Z794 Long term (current) use of insulin: Secondary | ICD-10-CM | POA: Diagnosis not present

## 2016-01-28 DIAGNOSIS — Z8673 Personal history of transient ischemic attack (TIA), and cerebral infarction without residual deficits: Secondary | ICD-10-CM | POA: Diagnosis not present

## 2016-01-28 LAB — URINALYSIS, ROUTINE W REFLEX MICROSCOPIC
Bilirubin Urine: NEGATIVE
Glucose, UA: 50 mg/dL — AB
HGB URINE DIPSTICK: NEGATIVE
KETONES UR: NEGATIVE mg/dL
NITRITE: NEGATIVE
PH: 5 (ref 5.0–8.0)
Protein, ur: NEGATIVE mg/dL
Specific Gravity, Urine: 1.013 (ref 1.005–1.030)

## 2016-01-28 LAB — BASIC METABOLIC PANEL
ANION GAP: 8 (ref 5–15)
BUN: 9 mg/dL (ref 6–20)
CALCIUM: 9.2 mg/dL (ref 8.9–10.3)
CO2: 28 mmol/L (ref 22–32)
Chloride: 101 mmol/L (ref 101–111)
Creatinine, Ser: 0.88 mg/dL (ref 0.44–1.00)
Glucose, Bld: 151 mg/dL — ABNORMAL HIGH (ref 65–99)
POTASSIUM: 3.2 mmol/L — AB (ref 3.5–5.1)
Sodium: 137 mmol/L (ref 135–145)

## 2016-01-28 LAB — CBC
HCT: 37.2 % (ref 36.0–46.0)
HEMOGLOBIN: 12.9 g/dL (ref 12.0–15.0)
MCH: 27.4 pg (ref 26.0–34.0)
MCHC: 34.7 g/dL (ref 30.0–36.0)
MCV: 79 fL (ref 78.0–100.0)
Platelets: 218 10*3/uL (ref 150–400)
RBC: 4.71 MIL/uL (ref 3.87–5.11)
RDW: 13.7 % (ref 11.5–15.5)
WBC: 7.6 10*3/uL (ref 4.0–10.5)

## 2016-01-28 LAB — CBG MONITORING, ED: Glucose-Capillary: 136 mg/dL — ABNORMAL HIGH (ref 65–99)

## 2016-01-28 NOTE — ED Notes (Signed)
Patient transported to X-ray 

## 2016-01-28 NOTE — ED Provider Notes (Signed)
Emergency Department Provider Note   I have reviewed the triage vital signs and the nursing notes.   HISTORY  Chief Complaint Dizziness   HPI Allison [name] is a 82 y.o. female with PMH of GERD, DM, HLD, and HTN presents to the emergency department for evaluation of near syncope. The patient was shopping today at [facility] when she suddenly felt very warm all over. She felt lightheaded and had to lower herself to the ground. There was no loss of consciousness. No head trauma. She states took all of her medications this morning including her insulin but did not eat breakfast or lunch. She denies any headache, chest pain, heart palpitations, difficulty breathing. No fevers or chills. No dysuria. No history of similar symptoms. No vertigo.No exacerbating or alleviating factors. Patient states this time she feels at her baseline.   Past Medical History:  Diagnosis Date  . Carpal tunnel syndrome   . Diabetes mellitus without complication ([place])   . Dysphagia   . GERD (gastroesophageal reflux disease)   . Hiatal hernia   . HLD (hyperlipidemia)   . Hx of adenomatous colonic polyps 06/19/2014  . Hypertension   . Hypothyroidism   . Onychomycosis 03/05/2012  . Pain in lower limb 04/18/2013  . Sciatic nerve pain   . Vertigo     Patient Active Problem List   Diagnosis Date Noted  . ARI (acute respiratory infection) 01/22/2016  . Uncontrolled type 2 diabetes mellitus with hyperglycemia, with Dylyn Mclaren-term current use of insulin ([place]) 10/29/2015  . Acute ischemic stroke ([place]) 10/29/2015  . Hallucinations   . Visual hallucinations 10/28/2015  . Stroke-like symptoms 10/28/2015  . CVA (cerebral vascular accident) ([place]) 10/28/2015  . Ischemic stroke ([place])   . Diabetes mellitus with complication ([place])   . Hx of adenomatous colonic polyps 06/19/2014  . Neuropathic pain 03/27/2014  . Uncontrolled type 2 diabetes with neuropathy ([place]) 03/27/2014  . Bilateral carpal tunnel syndrome 03/27/2014  . AKI  (acute kidney injury) ([place]) 08/05/2013  . DM type 2, uncontrolled, with renal complications ([place]) 0000000  . Hypercalcemia 08/05/2013  . PVD (peripheral vascular disease) ([place]) 03/05/2012  . Onychomycosis 03/05/2012  . Other hammer toe (acquired) 03/05/2012  . Abnormal CXR 12/25/2011  . Hypothyroidism 06/23/2006  . Dyslipidemia 06/23/2006  . Essential hypertension 06/23/2006  . GERD 06/23/2006  . IBS 06/23/2006    Past Surgical History:  Procedure Laterality Date  . BLADDER SURGERY     Mesh implant  . COLONOSCOPY    . LARYNX SURGERY     vocal cords  . PARTIAL HYSTERECTOMY  1978  . VESICOVAGINAL FISTULA CLOSURE W/ TAH  1976    Current Outpatient Rx  . Order #: [id number] Class: Normal  . Order #: [id number] Class: Normal  . Order #: [id number] Class: Normal  . Order #: [id number] Class: Normal  . Order #: [id number] Class: Normal  . Order #: [id number] Class: Historical Med  . Order #: [id number] Class: Normal  . Order #: [id number] Class: Normal  . Order #: [id number] Class: Normal  . Order #: [id number] Class: Normal  . Order #: [id number] Class: Historical Med  . Order #: [id number] Class: Fax  . Order #: [id number] Class: Historical Med  . Order #: [id number] Class: Normal  . Order #: [id number] Class: Normal  . Order #: [id number] Class: Historical Med    Allergies Lantus [insulin glargine]; Levemir [insulin detemir]; Pineapple; Codeine; Invokana [canagliflozin]; Penicillins; Tradjenta [linagliptin]; and Vioxx [rofecoxib]  Family History  Problem Relation Age of Onset  . Alzheimer's disease Mother   . Glaucoma Mother   .  Heart disease Father   . Alzheimer's disease Father   . Asthma Sister   . Allergies Sister   . Allergies Sister   . Sleep apnea Sister   . Heart disease Sister   . Breast cancer Maternal Aunt   . Pancreatic cancer Maternal Aunt   . Prostate cancer Maternal Uncle   . Prostate cancer Paternal Uncle   . Alcoholism Maternal Uncle   . Kidney disease Maternal Aunt   .  Heart disease Maternal Aunt     Social History Social History  Substance Use Topics  . Smoking status: Never Smoker  . Smokeless tobacco: Never Used  . Alcohol use No    Review of Systems  Constitutional: No fever/chills. Positive near syncope.  Eyes: No visual changes. ENT: No sore throat. Cardiovascular: Denies chest pain. Respiratory: Denies shortness of breath. Gastrointestinal: No abdominal pain.  No nausea, no vomiting.  No diarrhea.  No constipation. Genitourinary: Negative for dysuria. Musculoskeletal: Negative for back pain. Skin: Negative for rash. Neurological: Negative for headaches, focal weakness or numbness.  10-point ROS otherwise negative.  ____________________________________________   PHYSICAL EXAM:  VITAL SIGNS: ED Triage Vitals  Enc Vitals Group     BP 01/28/16 1346 114/71     Pulse Rate 01/28/16 1346 87     Resp 01/28/16 1346 16     Temp 01/28/16 1346 98.2 F (36.8 C)     Temp Source 01/28/16 1346 Oral     SpO2 01/28/16 1336 99 %     Weight 01/28/16 1347 142 lb (64.4 kg)     Height 01/28/16 1347 5\' 4"  (1.626 m)    Constitutional: Alert and oriented. Well appearing and in no acute distress. Eyes: Conjunctivae are normal. PERRL.  Head: Atraumatic. Nose: No congestion/rhinnorhea. Mouth/Throat: Mucous membranes are moist.   Neck: No stridor.   Cardiovascular: Normal rate, regular rhythm. Good peripheral circulation. Grossly normal heart sounds.   Respiratory: Normal respiratory effort.  No retractions. Lungs CTAB. Gastrointestinal: Soft and nontender. No distention.  Musculoskeletal: No lower extremity tenderness nor edema. No gross deformities of extremities. Neurologic:  Normal speech and language. No gross focal neurologic deficits are appreciated.  Skin:  Skin is warm, dry and intact. No rash noted. Psychiatric: Mood and affect are normal. Speech and behavior are normal.  ____________________________________________   LABS (all labs  ordered are listed, but only abnormal results are displayed)  Labs Reviewed  BASIC METABOLIC PANEL - Abnormal; Notable for the following:       Result Value   Potassium 3.2 (*)    Glucose, Bld 151 (*)    All other components within normal limits  URINALYSIS, ROUTINE W REFLEX MICROSCOPIC - Abnormal; Notable for the following:    Glucose, UA 50 (*)    Leukocytes, UA SMALL (*)    Bacteria, UA MANY (*)    Squamous Epithelial / LPF 0-5 (*)    All other components within normal limits  CBG MONITORING, ED - Abnormal; Notable for the following:    Glucose-Capillary 136 (*)    All other components within normal limits  CBC   ____________________________________________  EKG   EKG Interpretation  Date/Time:  Tuesday January 28 2016 13:50:20 EST Ventricular Rate:  84 PR Interval:    QRS Duration: 71 QT Interval:  365 QTC Calculation: 432 R Axis:   40 Text Interpretation:  Sinus rhythm Borderline T abnormalities, anterior leads No significant change since last tracing Confirmed by KNAPP  MD-J, JON [id number]) on 01/28/2016 2:02:00 PM  ____________________________________________  RADIOLOGY  Dg Chest 2 View  Result Date: 01/28/2016 CLINICAL DATA:  82 y/o female with productive cough with thick, white sputum x4 days. Dizzy today. EXAM: CHEST  2 VIEW COMPARISON:  10/28/2015 FINDINGS: Atherosclerotic calcification of the aortic arch. Thoracic spondylosis. Heart size normal. The lungs appear clear. IMPRESSION: 1.  Atherosclerotic calcification of the aortic arch. 2. No acute findings. Electronically Signed   By: Van Clines M.D.   On: 01/28/2016 18:54    ____________________________________________   PROCEDURES  Procedure(s) performed:   Procedures  None ____________________________________________   INITIAL IMPRESSION / ASSESSMENT AND PLAN / ED COURSE  Pertinent labs & imaging results that were available during my care of the patient were reviewed by me and  considered in my medical decision making (see chart for details).  Patient presents to the emergency department for evaluation of near syncope. Patient took her insulin this morning without eating. Blood sugar here in the emergency department is normal. No preceding chest pain, dyspnea, diaphoresis. Labs are largely unremarkable. Patient has had a cough for the past several days on an antibiotic. Plan for CXR and reassessment.   CXR negative. Labs and EKG largely unremarkable. Patient advised to eat if she will be taking insulin. Also advised close PCP follow up with for additional near-syncope evaluation as outpatient. Patient is feeling better and wishes to return home. She does not want to be admitted for monitoring.   At this time, I do not feel there is any life-threatening condition present. I have reviewed and discussed all results (EKG, imaging, lab, urine as appropriate), exam findings with patient. I have reviewed nursing notes and appropriate previous records.  I feel the patient is safe to be discharged home without further emergent workup. Discussed usual and customary return precautions. Patient and family (if present) verbalize understanding and are comfortable with this plan.  Patient will follow-up with their primary care provider. If they do not have a primary care provider, information for follow-up has been provided to them. All questions have been answered.  ____________________________________________  FINAL CLINICAL IMPRESSION(S) / ED DIAGNOSES  Final diagnoses:  Near syncope     MEDICATIONS GIVEN DURING THIS VISIT:  None  NEW OUTPATIENT MEDICATIONS STARTED DURING THIS VISIT:  None   Note:  This document was prepared using Dragon voice recognition software and may include unintentional dictation errors.  Nanda Quinton, MD Emergency Medicine   Margette Fast, MD 01/29/16 [phone]

## 2016-01-28 NOTE — Discharge Instructions (Signed)

## 2016-01-28 NOTE — ED Triage Notes (Signed)
Per EMS-states she was shopping and became dizzy which lasted 5 minutes, no LOC.  Pt reports that she recently finished an antibiotic for URI(?).  CBG 199 en route.  Pt reports that she took insulin this morning, but has not eaten.  Hx of TIA in Oct 2017.  A & Ox4.

## 2016-01-28 NOTE — ED Notes (Signed)
Discharge instructions and follow up care reviewed with patient. Patient verbalized understanding. 

## 2016-01-30 ENCOUNTER — Other Ambulatory Visit: Payer: Medicare Other

## 2016-01-31 ENCOUNTER — Other Ambulatory Visit: Payer: Self-pay | Admitting: Internal Medicine

## 2016-01-31 DIAGNOSIS — E034 Atrophy of thyroid (acquired): Secondary | ICD-10-CM

## 2016-02-03 ENCOUNTER — Ambulatory Visit: Payer: Medicare Other | Admitting: Pharmacotherapy

## 2016-02-04 ENCOUNTER — Telehealth: Payer: Self-pay

## 2016-02-04 ENCOUNTER — Other Ambulatory Visit: Payer: Self-pay

## 2016-02-04 DIAGNOSIS — E1110 Type 2 diabetes mellitus with ketoacidosis without coma: Secondary | ICD-10-CM

## 2016-02-04 DIAGNOSIS — Z794 Long term (current) use of insulin: Principal | ICD-10-CM

## 2016-02-04 NOTE — Telephone Encounter (Signed)
-----   Message from Virgel Manifold sent at 02/03/2016 10:53 AM EST ----- Regarding: Nurse Call Center Report Good morning Marlowe Kays,  I have indexed a Nurse Call Center Report that was received this morning for [place]ner [place] in [facility] under the Media Tab.  Thank you, Alycia Rossetti

## 2016-02-04 NOTE — Patient Outreach (Signed)
[facility]) Care Management  02/04/2016  VILDA SLONECKER 1934/02/01 [id number]   Unsuccessful attempt to reach patient.  No answer received- unable to leave a message.    RN called to follow up on call received yesterday (02-03-16).  Patient called this RN on 02-03-16 to ask about whether to take her insulin based on cbg of 167.  Patient was advised to take her insulin as ordered.  RN will make another attempt to reach patient within one week.  Candie Mile, RN, MSN [place] [phone] Fax [phone]

## 2016-02-04 NOTE — Patient Outreach (Signed)
[facility]) Care Management  02/04/2016  Allison [name] 1934/08/30 [id number]  Late Entry:  Received call from patient on 02-03-16.  She reported her blood sugar this am was 167, and she was unsure whether she should take her insulin or not (this cbg is lower than normal).  Reviewed normal range of blood sugars with her, and advised her to take her insulin as ordered.  RN will follow up with patient within one week.  Candie Mile, RN, MSN [place] [phone] Fax [phone]

## 2016-02-10 ENCOUNTER — Encounter: Payer: Self-pay | Admitting: Pharmacotherapy

## 2016-02-10 ENCOUNTER — Ambulatory Visit (INDEPENDENT_AMBULATORY_CARE_PROVIDER_SITE_OTHER): Payer: Medicare Other | Admitting: Pharmacotherapy

## 2016-02-10 VITALS — BP 158/80 | HR 76 | Ht 64.0 in | Wt 147.0 lb

## 2016-02-10 DIAGNOSIS — IMO0002 Reserved for concepts with insufficient information to code with codable children: Secondary | ICD-10-CM

## 2016-02-10 DIAGNOSIS — E1165 Type 2 diabetes mellitus with hyperglycemia: Secondary | ICD-10-CM | POA: Diagnosis not present

## 2016-02-10 DIAGNOSIS — I10 Essential (primary) hypertension: Secondary | ICD-10-CM

## 2016-02-10 DIAGNOSIS — E1121 Type 2 diabetes mellitus with diabetic nephropathy: Secondary | ICD-10-CM | POA: Diagnosis not present

## 2016-02-10 NOTE — Patient Instructions (Signed)
Bring your medications to next office visit.  No missed doses of any medication.

## 2016-02-10 NOTE — Progress Notes (Signed)
  Subjective:    Allison [name] is a 82 y.o.African American female who presents for follow-up of Type 2 diabetes mellitus.  Last A1C:  11.2% Historically does not take insulin as prescribed.  Did not bring blood glucose meter today. She says "it fluctuates"  Often sees BG in the 300's BG was 175m/dl  She is on Novolog 14 units twice daily. Had a low BG reaction after she took her insulin and did not eat breakfast. No routine exercise. Has Silver Sneakers benefit.  Wears glasses. Denies problems with vision Denies problems with feet. No peripheral edema. Nocturia twice each night. No dysuria Not drinking much water - usually drinks Pepsi.  Has a dry cough today. Labs are due in February.  Review of Systems A comprehensive review of systems was negative except for: Eyes: positive for contacts/glasses Respiratory: positive for cough Genitourinary: positive for nocturia    Objective:    BP (!) 158/80   Pulse 76   Ht 5' 4"$  (1.626 m)   Wt 147 lb (66.7 kg)   BMI 25.23 kg/m   General:  alert, cooperative and no distress  Oropharynx: normal findings: lips normal without lesions and gums healthy   Eyes:  negative findings: lids and lashes normal and conjunctivae and sclerae normal   Ears:  external ears normal        Lung: clear to auscultation bilaterally  Heart:  regular rate and rhythm     Extremities: extremities normal, atraumatic, no cyanosis or edema  Skin: warm and dry, no hyperpigmentation, vitiligo, or suspicious lesions     Neuro: mental status, speech normal, alert and oriented x3 and gait and station normal   Lab Review Glucose, Bld (mg/dL)  Date Value  01/28/2016 151 (H)  10/30/2015 130 (H)  10/28/2015 293 (H)   CO2 (mmol/L)  Date Value  01/28/2016 28  10/30/2015 26  10/28/2015 27   BUN (mg/dL)  Date Value  01/28/2016 9  10/30/2015 11  10/28/2015 9  05/22/2015 10  02/20/2015 12  10/19/2014 11   Creat (mg/dL)  Date Value  09/03/2015  0.80   Creatinine, Ser (mg/dL)  Date Value  01/28/2016 0.88  10/30/2015 0.96  10/28/2015 0.77       Assessment:    Diabetes Mellitus type II, under poor control.  A1C above goal <7% BP above goal <130/80.  She did not take her chlorthalidone this morning.   Plan:    1.  Rx changes: none  2.  Long discussion on why she needs to take all RX as prescribed.  No missed doses. 3.  Continue Novolog 14 units with breakfast and supper.  Always eat a meal with each injeciton. 4.  Continue metformin. 5.  Counseled on her high risk of CVA if she doesn't take her diabetes and blood pressure medications. 6.  Counseled on need for routine exercise.  Goal is 30-45 minutes 5 x week. 7.  BP above goal <130/80 due to not taking medication as prescribed.

## 2016-02-10 NOTE — Progress Notes (Signed)
Note reviewed.  Agree with Cathey's A/P.

## 2016-02-11 ENCOUNTER — Other Ambulatory Visit: Payer: Self-pay

## 2016-02-11 VITALS — BP 151/82

## 2016-02-11 DIAGNOSIS — E1110 Type 2 diabetes mellitus with ketoacidosis without coma: Secondary | ICD-10-CM

## 2016-02-11 DIAGNOSIS — Z794 Long term (current) use of insulin: Principal | ICD-10-CM

## 2016-02-11 NOTE — Patient Outreach (Signed)
[facility]) Care Management  02/11/2016  Allison [name] 02-Dec-1934 [id number]  Telephone assessment with patient.  She reports she saw her PCP yesterday due to cough/cold.  Reports PCP recommended Mucinex for the cough, and that it is helping.  Reviewed with patient differences between cold symptoms and flu symptoms.  Instructed her to contact PCP if she develops fever or other flu symptoms.  Patient has seen a nutritionist for diabetic teaching.  She reports she was given written materials.  Encouraged her to review same, and keep the next appointment scheduled.  Discussed with patient importance of self-management of diabetes to decrease risk of complications.  Plan:  Patient will focus on adhering to diabetic dietary guidelines.           Patient will check and record daily cbgs.           Patient will take meds as ordered.           RN will follow up in February.  Candie Mile, RN, MSN [place] [phone] Fax [phone]

## 2016-02-12 ENCOUNTER — Encounter: Payer: Self-pay | Admitting: Internal Medicine

## 2016-02-13 ENCOUNTER — Ambulatory Visit (INDEPENDENT_AMBULATORY_CARE_PROVIDER_SITE_OTHER): Payer: Medicare Other | Admitting: Nurse Practitioner

## 2016-02-13 ENCOUNTER — Encounter: Payer: Self-pay | Admitting: Nurse Practitioner

## 2016-02-13 VITALS — BP 140/78 | HR 81 | Temp 98.0°F | Resp 18 | Ht 64.0 in | Wt 147.0 lb

## 2016-02-13 DIAGNOSIS — E1121 Type 2 diabetes mellitus with diabetic nephropathy: Secondary | ICD-10-CM | POA: Diagnosis not present

## 2016-02-13 DIAGNOSIS — R05 Cough: Secondary | ICD-10-CM | POA: Diagnosis not present

## 2016-02-13 DIAGNOSIS — E876 Hypokalemia: Secondary | ICD-10-CM

## 2016-02-13 DIAGNOSIS — E1165 Type 2 diabetes mellitus with hyperglycemia: Secondary | ICD-10-CM | POA: Diagnosis not present

## 2016-02-13 DIAGNOSIS — E785 Hyperlipidemia, unspecified: Secondary | ICD-10-CM | POA: Diagnosis not present

## 2016-02-13 DIAGNOSIS — R059 Cough, unspecified: Secondary | ICD-10-CM

## 2016-02-13 DIAGNOSIS — IMO0002 Reserved for concepts with insufficient information to code with codable children: Secondary | ICD-10-CM

## 2016-02-13 LAB — COMPLETE METABOLIC PANEL WITH GFR
ALBUMIN: 3.7 g/dL (ref 3.6–5.1)
ALT: 8 U/L (ref 6–29)
AST: 11 U/L (ref 10–35)
Alkaline Phosphatase: 49 U/L (ref 33–130)
BILIRUBIN TOTAL: 0.5 mg/dL (ref 0.2–1.2)
BUN: 7 mg/dL (ref 7–25)
CALCIUM: 9.3 mg/dL (ref 8.6–10.4)
CO2: 29 mmol/L (ref 20–31)
CREATININE: 0.92 mg/dL (ref 0.60–0.93)
Chloride: 105 mmol/L (ref 98–110)
GFR, EST AFRICAN AMERICAN: 68 mL/min (ref >=60)
GFR, Est Non African American: 59 mL/min — ABNORMAL LOW (ref >=60)
Glucose, Bld: 94 mg/dL (ref 65–99)
Potassium: 3.6 mmol/L (ref 3.5–5.3)
Sodium: 143 mmol/L (ref 135–146)
TOTAL PROTEIN: 6.6 g/dL (ref 6.1–8.1)

## 2016-02-13 LAB — LIPID PANEL
CHOLESTEROL: 210 mg/dL — AB (ref <200)
HDL: 44 mg/dL — ABNORMAL LOW (ref >50)
LDL Cholesterol: 130 mg/dL — ABNORMAL HIGH (ref <100)
TRIGLYCERIDES: 182 mg/dL — AB (ref <150)
Total CHOL/HDL Ratio: 4.8 Ratio (ref <5.0)
VLDL: 36 mg/dL — AB (ref <30)

## 2016-02-13 LAB — HEMOGLOBIN A1C
Hgb A1c MFr Bld: 8 % — ABNORMAL HIGH (ref <5.7)
MEAN PLASMA GLUCOSE: 183 mg/dL

## 2016-02-13 MED ORDER — GLUCOSE BLOOD VI STRP: ORAL_STRIP | 11 refills | Status: DC

## 2016-02-13 NOTE — Progress Notes (Signed)
Careteam: Patient Care Team: Gildardo Cranker, DO as PCP - General (Internal Medicine) Candie Mile, RN as [place] Management  Advanced Directive information Does Patient Have a Medical Advance Directive?: No  Allergies  Allergen Reactions  . Lantus [Insulin Glargine] Itching and Swelling    angioedema  . Levemir [Insulin Detemir] Itching and Swelling    angioedema  . Pineapple Anaphylaxis  . Codeine Other (See Comments)    unknown  . Invokana [Canagliflozin] Other (See Comments)    Constipation, nightmares  . Penicillins Other (See Comments)    Unknown   . Tradjenta [Linagliptin] Other (See Comments)  . Vioxx [Rofecoxib] Other (See Comments)    unknown    Chief Complaint  Patient presents with  . Acute Visit    Cough x 1 week/just got over head cold; coughing so hard pt cannot control bowel     HPI: Patient is a 82 y.o. female seen in the office today for cough. Coughing all night, worse when she lays down flat.  Having BM due to the force of the cough.  Clear productive cough. Thick sputum.  Overall feels good.  Taking OTC medication for cough does not know what it is.  Was taking mucinex DM in the beginning but not taking now.  No fevers or chills.    Review of Systems:  Review of Systems  Constitutional: Negative for activity change, appetite change, fatigue and fever.  HENT: Positive for congestion, postnasal drip, rhinorrhea and sinus pressure. Negative for sore throat.        Had nasal drip, rhinorrhea, sinus pressure but this has all improved  Eyes: Negative for discharge.  Respiratory: Positive for cough. Negative for chest tightness and shortness of breath.   Cardiovascular: Negative for chest pain, palpitations and leg swelling.    Past Medical History:  Diagnosis Date  . Carpal tunnel syndrome   . Diabetes mellitus without complication ([place])   . Dysphagia   . GERD (gastroesophageal reflux disease)   . Hiatal hernia   .  HLD (hyperlipidemia)   . Hx of adenomatous colonic polyps 06/19/2014  . Hypertension   . Hypothyroidism   . Onychomycosis 03/05/2012  . Pain in lower limb 04/18/2013  . Sciatic nerve pain   . Vertigo    Past Surgical History:  Procedure Laterality Date  . BLADDER SURGERY     Mesh implant  . COLONOSCOPY    . LARYNX SURGERY     vocal cords  . PARTIAL HYSTERECTOMY  1978  . VESICOVAGINAL FISTULA CLOSURE W/ TAH  1976   Social History:   reports that she has never smoked. She has never used smokeless tobacco. She reports that she does not drink alcohol or use drugs.  Family History  Problem Relation Age of Onset  . Alzheimer's disease Mother   . Glaucoma Mother   . Heart disease Father   . Alzheimer's disease Father   . Asthma Sister   . Allergies Sister   . Allergies Sister   . Sleep apnea Sister   . Heart disease Sister   . Breast cancer Maternal Aunt   . Pancreatic cancer Maternal Aunt   . Prostate cancer Maternal Uncle   . Prostate cancer Paternal Uncle   . Alcoholism Maternal Uncle   . Kidney disease Maternal Aunt   . Heart disease Maternal Aunt     Medications: Patient's Medications  New Prescriptions   No medications on file  Previous Medications   ATORVASTATIN (LIPITOR) 40  MG TABLET    take 1 tablet by mouth at bedtime   BETAMETHASONE DIPROPIONATE (DIPROLENE) 0.05 % CREAM    Apply topically 2 (two) times daily.   BLOOD GLUCOSE METER KIT AND SUPPLIES KIT    Check blood glucose twice daily before insulin therapy (ICD-10 E11.21, E11.65).   CHLORTHALIDONE (HYGROTON) 25 MG TABLET    Take 0.5 tablets (12.5 mg total) by mouth daily.   CITALOPRAM (CELEXA) 10 MG TABLET    Take 1 tablet (10 mg total) by mouth daily. For anxiety   CLOPIDOGREL (PLAVIX) 75 MG TABLET    Take 1 tablet (75 mg total) by mouth daily.   DEXTROMETHORPHAN-GUAIFENESIN (MUCINEX DM) 30-600 MG 12HR TABLET    One twice daily to suppress cough and congestion   GABAPENTIN (NEURONTIN) 100 MG CAPSULE    Take 200  mg by mouth 3 (three) times daily as needed (nerve pain).    GLUCOSE BLOOD (ONE TOUCH ULTRA TEST) TEST STRIP    Use to test blood sugar three times daily. Dx: E11.21   INSULIN ASPART (NOVOLOG FLEXPEN) 100 UNIT/ML FLEXPEN    Inject 14 Units into the skin 2 (two) times daily.   LEVOTHYROXINE (SYNTHROID, LEVOTHROID) 150 MCG TABLET    take 1 tablet by mouth every morning ON AN EMPTY STOMACH for THROID   METFORMIN (GLUCOPHAGE) 500 MG TABLET    take 2 tablets by mouth twice a day with food   OMEPRAZOLE (PRILOSEC) 20 MG CAPSULE    take 1 capsule by mouth once daily   ONETOUCH DELICA LANCETS 01S MISC    Use to test blood sugar three times daily. Dx: E11.21   VITAMIN E (VITAMIN E) 1000 UNIT CAPSULE    Take 1,000 Units by mouth daily.  Modified Medications   No medications on file  Discontinued Medications   No medications on file     Physical Exam:  Vitals:   02/13/16 1346  BP: 140/78  Pulse: 81  Resp: 18  Temp: 98 F (36.7 C)  TempSrc: Oral  SpO2: 98%  Weight: 147 lb (66.7 kg)  Height: '5\' 4"'  (1.626 m)   Body mass index is 25.23 kg/m.  Physical Exam  Constitutional: She appears well-developed and well-nourished. No distress.  HENT:  Mouth/Throat: Oropharynx is clear and moist. No oropharyngeal exudate.  Eyes: Pupils are equal, round, and reactive to light. No scleral icterus.  Neck: Neck supple. Carotid bruit is not present.  Cardiovascular: Normal rate and regular rhythm.  Exam reveals no gallop and no friction rub.   Murmur (1/6 SEM) heard. Pulmonary/Chest: Effort normal and breath sounds normal. No respiratory distress. She has no wheezes. She has no rales.  Abdominal: Soft. Bowel sounds are normal. There is no hepatomegaly.  Musculoskeletal: She exhibits edema.  Neurological: She is alert. She exhibits normal muscle tone.  Skin: Skin is warm and dry.  Psychiatric: She has a normal mood and affect. Her behavior is normal. Thought content normal.    Labs reviewed: Basic  Metabolic Panel:  Recent Labs  05/22/15 0845 09/03/15 0845  10/28/15 0029 10/28/15 0721 10/30/15 0428 01/28/16 1414  NA 139 138  < > 135  --  138 137  K 4.3 4.1  < > 3.5  --  3.3* 3.2*  CL 96 100  < > 98*  --  103 101  CO2 27 28  < > 27  --  26 28  GLUCOSE 231* 216*  < > 293*  --  130* 151*  BUN 10  12  < > 9  --  11 9  CREATININE 0.79 0.80  < > 0.77  --  0.96 0.88  CALCIUM 9.7 9.5  < > 9.9  --  9.8 9.2  TSH 3.470 0.35*  --   --  1.885  --   --   < > = values in this interval not displayed. Liver Function Tests:  Recent Labs  02/20/15 0808 05/22/15 0845 09/03/15 0845  AST 9 11  --   ALT '10 10 14  ' ALKPHOS 81 83  --   BILITOT 0.3 0.5  --   PROT 6.5 7.0  --   ALBUMIN 3.8 4.0  --    No results for input(s): LIPASE, AMYLASE in the last 8760 hours. No results for input(s): AMMONIA in the last 8760 hours. CBC:  Recent Labs  02/20/15 0808  10/26/15 2157 10/28/15 0029 10/28/15 1213 10/30/15 0428 01/28/16 1414  WBC 8.1  < > 7.7 10.1  --  10.1 7.6  NEUTROABS 4.1  --  4.2  --   --   --   --   HGB  --   < > 13.9 13.1  --  12.9 12.9  HCT 38.2  < > 40.8 38.5 41.2 38.8 37.2  MCV 81  < > 80.2 79.4  --  79.0 79.0  PLT 251  < > 248 252  --  242 218  < > = values in this interval not displayed. Lipid Panel:  Recent Labs  02/20/15 0808 05/22/15 0845 09/03/15 0845 10/29/15 0352  CHOL 260* 273* 266* 290*  HDL 48 54 45* 41  LDLCALC 189* 198* 178* 213*  TRIG 117 103 217* 180*  CHOLHDL 5.4*  --  5.9* 7.1   TSH:  Recent Labs  05/22/15 0845 09/03/15 0845 10/28/15 0721  TSH 3.470 0.35* 1.885   A1C: Lab Results  Component Value Date   HGBA1C 11.2 (H) 10/28/2015     Assessment/Plan 1. Cough After viral illness. To start mucinex DM by mouth twice daily as needed for cough and congestion. Encouraged proper hydration.   2. Uncontrolled type 2 diabetes mellitus with diabetic nephropathy, without long-term current use of insulin ([place]) -following with Michaela Corner D,  will cont current regimen. A1c not at goal on recent labs. Pt had hypoglycemic episode in January and had to go to ED.  - Hemoglobin A1c  3. Hyperlipidemia LDL goal <100 -pt has been restarted on Lipitor, has not had follow up labs, will get these days however pt not fasting today but labs overdue for follow up.  - CMP with eGFR - Lipid panel  4. Hypokalemia Noted from visit to ED, will follow up labs today  Cortana Vanderford K. Harle Battiest  [facility] [phone] 8 am - 5 pm) [phone] (after hours)

## 2016-02-13 NOTE — Patient Instructions (Signed)
Cough can last 6 weeks after viral illness Drink plenty of fluids mucinex DM by mouth twice daily as needed for cough and congestion    Cough, Adult Introduction A cough helps to clear your throat and lungs. A cough may last only 2-3 weeks (acute), or it may last longer than 8 weeks (chronic). Many different things can cause a cough. A cough may be a sign of an illness or another medical condition. Follow these instructions at home:  Pay attention to any changes in your cough.  Take medicines only as told by your doctor.  If you were prescribed an antibiotic medicine, take it as told by your doctor. Do not stop taking it even if you start to feel better.  Talk with your doctor before you try using a cough medicine.  Drink enough fluid to keep your pee (urine) clear or pale yellow.  If the air is dry, use a cold steam vaporizer or humidifier in your home.  Stay away from things that make you cough at work or at home.  If your cough is worse at night, try using extra pillows to raise your head up higher while you sleep.  Do not smoke, and try not to be around smoke. If you need help quitting, ask your doctor.  Do not have caffeine.  Do not drink alcohol.  Rest as needed. Contact a doctor if:  You have new problems (symptoms).  You cough up yellow fluid (pus).  Your cough does not get better after 2-3 weeks, or your cough gets worse.  Medicine does not help your cough and you are not sleeping well.  You have pain that gets worse or pain that is not helped with medicine.  You have a fever.  You are losing weight and you do not know why.  You have night sweats. Get help right away if:  You cough up blood.  You have trouble breathing.  Your heartbeat is very fast. This information is not intended to replace advice given to you by your health care provider. Make sure you discuss any questions you have with your health care provider. Document Released: 09/11/2010  Document Revised: 06/06/2015 Document Reviewed: 03/07/2014  2017 [employer]

## 2016-02-15 ENCOUNTER — Encounter (HOSPITAL_COMMUNITY): Payer: Self-pay | Admitting: Emergency Medicine

## 2016-02-15 ENCOUNTER — Emergency Department (HOSPITAL_COMMUNITY): Payer: Medicare Other

## 2016-02-15 ENCOUNTER — Emergency Department (HOSPITAL_COMMUNITY)
Admission: EM | Admit: 2016-02-15 | Discharge: 2016-02-15 | Disposition: A | Discharge status: Home / Self Care | Payer: Medicare Other | Attending: Emergency Medicine | Admitting: Emergency Medicine

## 2016-02-15 DIAGNOSIS — H538 Other visual disturbances: Secondary | ICD-10-CM | POA: Diagnosis not present

## 2016-02-15 DIAGNOSIS — H8309 Labyrinthitis, unspecified ear: Secondary | ICD-10-CM | POA: Diagnosis not present

## 2016-02-15 DIAGNOSIS — Z5181 Encounter for therapeutic drug level monitoring: Secondary | ICD-10-CM | POA: Diagnosis not present

## 2016-02-15 DIAGNOSIS — Z79899 Other long term (current) drug therapy: Secondary | ICD-10-CM | POA: Diagnosis not present

## 2016-02-15 DIAGNOSIS — Z8673 Personal history of transient ischemic attack (TIA), and cerebral infarction without residual deficits: Secondary | ICD-10-CM | POA: Diagnosis not present

## 2016-02-15 DIAGNOSIS — E039 Hypothyroidism, unspecified: Secondary | ICD-10-CM | POA: Insufficient documentation

## 2016-02-15 DIAGNOSIS — Z794 Long term (current) use of insulin: Secondary | ICD-10-CM | POA: Diagnosis not present

## 2016-02-15 DIAGNOSIS — E119 Type 2 diabetes mellitus without complications: Secondary | ICD-10-CM | POA: Diagnosis not present

## 2016-02-15 DIAGNOSIS — R2681 Unsteadiness on feet: Secondary | ICD-10-CM | POA: Diagnosis present

## 2016-02-15 DIAGNOSIS — I10 Essential (primary) hypertension: Secondary | ICD-10-CM | POA: Insufficient documentation

## 2016-02-15 DIAGNOSIS — R55 Syncope and collapse: Secondary | ICD-10-CM | POA: Diagnosis not present

## 2016-02-15 LAB — COMPREHENSIVE METABOLIC PANEL
ALT: 11 U/L — ABNORMAL LOW (ref 14–54)
ANION GAP: 9 (ref 5–15)
AST: 17 U/L (ref 15–41)
Albumin: 3.4 g/dL — ABNORMAL LOW (ref 3.5–5.0)
Alkaline Phosphatase: 47 U/L (ref 38–126)
BUN: 5 mg/dL — ABNORMAL LOW (ref 6–20)
CALCIUM: 9.2 mg/dL (ref 8.9–10.3)
CHLORIDE: 106 mmol/L (ref 101–111)
CO2: 27 mmol/L (ref 22–32)
CREATININE: 0.78 mg/dL (ref 0.44–1.00)
Glucose, Bld: 222 mg/dL — ABNORMAL HIGH (ref 65–99)
Potassium: 3.6 mmol/L (ref 3.5–5.1)
SODIUM: 142 mmol/L (ref 135–145)
Total Bilirubin: 0.7 mg/dL (ref 0.3–1.2)
Total Protein: 6.1 g/dL — ABNORMAL LOW (ref 6.5–8.1)

## 2016-02-15 LAB — CBC
HEMATOCRIT: 34 % — AB (ref 36.0–46.0)
Hemoglobin: 11.7 g/dL — ABNORMAL LOW (ref 12.0–15.0)
MCH: 27.6 pg (ref 26.0–34.0)
MCHC: 34.4 g/dL (ref 30.0–36.0)
MCV: 80.2 fL (ref 78.0–100.0)
PLATELETS: 246 10*3/uL (ref 150–400)
RBC: 4.24 MIL/uL (ref 3.87–5.11)
RDW: 14.5 % (ref 11.5–15.5)
WBC: 7 10*3/uL (ref 4.0–10.5)

## 2016-02-15 LAB — I-STAT CHEM 8, ED
BUN: 5 mg/dL — ABNORMAL LOW (ref 6–20)
CALCIUM ION: 1.21 mmol/L (ref 1.15–1.40)
CHLORIDE: 105 mmol/L (ref 101–111)
Creatinine, Ser: 0.8 mg/dL (ref 0.44–1.00)
GLUCOSE: 212 mg/dL — AB (ref 65–99)
HCT: 33 % — ABNORMAL LOW (ref 36.0–46.0)
HEMOGLOBIN: 11.2 g/dL — AB (ref 12.0–15.0)
Potassium: 3.4 mmol/L — ABNORMAL LOW (ref 3.5–5.1)
SODIUM: 143 mmol/L (ref 135–145)
TCO2: 26 mmol/L (ref 0–100)

## 2016-02-15 LAB — DIFFERENTIAL
BASOS PCT: 1 %
Basophils Absolute: 0 10*3/uL (ref 0.0–0.1)
EOS PCT: 4 %
Eosinophils Absolute: 0.3 10*3/uL (ref 0.0–0.7)
Lymphocytes Relative: 31 %
Lymphs Abs: 2.2 10*3/uL (ref 0.7–4.0)
MONO ABS: 0.7 10*3/uL (ref 0.1–1.0)
MONOS PCT: 9 %
Neutro Abs: 3.9 10*3/uL (ref 1.7–7.7)
Neutrophils Relative %: 55 %

## 2016-02-15 LAB — PROTIME-INR
INR: 1.09
PROTHROMBIN TIME: 14.2 s (ref 11.4–15.2)

## 2016-02-15 LAB — I-STAT TROPONIN, ED: TROPONIN I, POC: 0 ng/mL (ref 0.00–0.08)

## 2016-02-15 LAB — ETHANOL

## 2016-02-15 LAB — APTT: aPTT: 28 seconds (ref 24–36)

## 2016-02-15 MED ORDER — MECLIZINE HCL 25 MG PO TABS: 25.0000 mg | ORAL_TABLET | Freq: Three times a day (TID) | ORAL | 0 refills | Status: DC | PRN

## 2016-02-15 NOTE — ED Triage Notes (Addendum)
Pt in POV reporting high blood pressure over the past several hrs. 193/99, 196/108, 205/102. Took dose of chlorthalidone. Pt concerned bc stroke in October. Pt also reports "staggering" and "blurry vision" pt then reports that her BP was up bc she did not take her BP medicine yesterday evening.

## 2016-02-15 NOTE — Discharge Instructions (Signed)
Follow-up with your PCP.

## 2016-02-15 NOTE — ED Provider Notes (Signed)
[facility] DEPT Provider Note   CSN: [id number] Arrival date & time: 02/15/16  6203     History   Chief Complaint Chief Complaint  Patient presents with  . Hypertension    HPI Allison [name] is a 82 y.o. female.  82 yo F with a cc of unsteadiness on her feet.  Going on for past three days.  Patient has had bouts of this with some difficulty walking.  Recent URI, has mostly resolved.  Denies fevers, headache, neck pain.  Denies head injury. Patient has a history of hypertension and has not been taking her medications over the past couple days. Nothing seems to make the unsteadiness better or worse. No when asked again patient that it was mildly worse upon walking. Denies worse with head movement.   The history is provided by the patient.  Hypertension  Pertinent negatives include no chest pain, no headaches and no shortness of breath.  Illness  This is a new problem. The current episode started more than 2 days ago. The problem occurs constantly. The problem has not changed since onset.Pertinent negatives include no chest pain, no headaches and no shortness of breath. The symptoms are aggravated by walking. Nothing relieves the symptoms. She has tried nothing for the symptoms. The treatment provided no relief.    Past Medical History:  Diagnosis Date  . Carpal tunnel syndrome   . Diabetes mellitus without complication ([place])   . Dysphagia   . GERD (gastroesophageal reflux disease)   . Hiatal hernia   . HLD (hyperlipidemia)   . Hx of adenomatous colonic polyps 06/19/2014  . Hypertension   . Hypothyroidism   . Onychomycosis 03/05/2012  . Pain in lower limb 04/18/2013  . Sciatic nerve pain   . Vertigo     Patient Active Problem List   Diagnosis Date Noted  . ARI (acute respiratory infection) 01/22/2016  . Uncontrolled type 2 diabetes mellitus with hyperglycemia, with long-term current use of insulin ([place]) 10/29/2015  . Acute ischemic stroke ([place]) 10/29/2015  .  Hallucinations   . Visual hallucinations 10/28/2015  . Stroke-like symptoms 10/28/2015  . CVA (cerebral vascular accident) ([place]) 10/28/2015  . Ischemic stroke ([place])   . Diabetes mellitus with complication ([place])   . Hx of adenomatous colonic polyps 06/19/2014  . Neuropathic pain 03/27/2014  . Uncontrolled type 2 diabetes with neuropathy ([place]) 03/27/2014  . Bilateral carpal tunnel syndrome 03/27/2014  . AKI (acute kidney injury) ([place]) 08/05/2013  . DM type 2, uncontrolled, with renal complications ([place]) 55/97/4163  . Hypercalcemia 08/05/2013  . PVD (peripheral vascular disease) ([place]) 03/05/2012  . Onychomycosis 03/05/2012  . Other hammer toe (acquired) 03/05/2012  . Abnormal CXR 12/25/2011  . Hypothyroidism 06/23/2006  . Dyslipidemia 06/23/2006  . Essential hypertension 06/23/2006  . GERD 06/23/2006  . IBS 06/23/2006    Past Surgical History:  Procedure Laterality Date  . BLADDER SURGERY     Mesh implant  . COLONOSCOPY    . LARYNX SURGERY     vocal cords  . PARTIAL HYSTERECTOMY  1978  . VESICOVAGINAL FISTULA CLOSURE W/ TAH  1976    OB History    No data available       Home Medications    Prior to Admission medications   Medication Sig Start Date End Date Taking? Authorizing Provider  atorvastatin (LIPITOR) 40 MG tablet take 1 tablet by mouth at bedtime 07/11/15   Gildardo Cranker, DO  betamethasone dipropionate (DIPROLENE) 0.05 % cream Apply topically 2 (two) times daily.  Historical Provider, MD  blood glucose meter kit and supplies KIT Check blood glucose twice daily before insulin therapy (ICD-10 E11.21, E11.65). 11/03/15   [name] Reed, DO  chlorthalidone (HYGROTON) 25 MG tablet Take 0.5 tablets (12.5 mg total) by mouth daily. 11/01/15   Eber Jones, MD  citalopram (CELEXA) 10 MG tablet Take 1 tablet (10 mg total) by mouth daily. For anxiety 01/01/16   Gildardo Cranker, DO  clopidogrel (PLAVIX) 75 MG tablet Take 1 tablet (75 mg total) by mouth daily. 01/16/16    Gildardo Cranker, DO  dextromethorphan-guaiFENesin [facility] DM) 30-600 MG 12hr tablet One twice daily to suppress cough and congestion 01/22/16   Estill Dooms, MD  gabapentin (NEURONTIN) 100 MG capsule Take 200 mg by mouth 3 (three) times daily as needed (nerve pain).     Historical Provider, MD  glucose blood (ONE TOUCH ULTRA TEST) test strip Use to test blood sugar three times daily. Dx: E11.21 02/13/16   Lauree Chandler, NP  insulin aspart (NOVOLOG FLEXPEN) 100 UNIT/ML FlexPen Inject 14 Units into the skin 2 (two) times daily.    Historical Provider, MD  levothyroxine (SYNTHROID, LEVOTHROID) 150 MCG tablet take 1 tablet by mouth every morning ON AN EMPTY STOMACH for THROID 01/31/16   Gildardo Cranker, DO  meclizine (ANTIVERT) 25 MG tablet Take 1 tablet (25 mg total) by mouth 3 (three) times daily as needed for dizziness. 02/15/16   Deno Etienne, DO  metFORMIN (GLUCOPHAGE) 500 MG tablet take 2 tablets by mouth twice a day with food 01/16/16   Gildardo Cranker, DO  omeprazole (PRILOSEC) 20 MG capsule take 1 capsule by mouth once daily 07/11/15   Gildardo Cranker, DO  [facility] DELICA LANCETS 40J MISC Use to test blood sugar three times daily. Dx: E11.21 12/23/15   Gildardo Cranker, DO  vitamin E (VITAMIN E) 1000 UNIT capsule Take 1,000 Units by mouth daily.    Historical Provider, MD    Family History Family History  Problem Relation Age of Onset  . Alzheimer's disease Mother   . Glaucoma Mother   . Heart disease Father   . Alzheimer's disease Father   . Asthma Sister   . Allergies Sister   . Allergies Sister   . Sleep apnea Sister   . Heart disease Sister   . Breast cancer Maternal Aunt   . Pancreatic cancer Maternal Aunt   . Prostate cancer Maternal Uncle   . Prostate cancer Paternal Uncle   . Alcoholism Maternal Uncle   . Kidney disease Maternal Aunt   . Heart disease Maternal Aunt     Social History Social History  Substance Use Topics  . Smoking status: Never Smoker  . Smokeless tobacco: Never  Used  . Alcohol use No     Allergies   Lantus [insulin glargine]; Levemir [insulin detemir]; Pineapple; Codeine; Invokana [canagliflozin]; Penicillins; Tradjenta [linagliptin]; and Vioxx [rofecoxib]   Review of Systems Review of Systems  Constitutional: Negative for chills and fever.  HENT: Negative for congestion and rhinorrhea.   Eyes: Negative for redness and visual disturbance.  Respiratory: Negative for shortness of breath and wheezing.   Cardiovascular: Negative for chest pain and palpitations.  Gastrointestinal: Negative for nausea and vomiting.  Genitourinary: Negative for dysuria and urgency.  Musculoskeletal: Negative for arthralgias and myalgias.  Skin: Negative for pallor and wound.  Neurological: Positive for dizziness (unsteadiness). Negative for headaches.     Physical Exam Updated Vital Signs BP 169/97   Pulse 62   Temp 97.5  F (36.4 C) (Oral)   Resp 18   SpO2 100%   Physical Exam  Constitutional: She is oriented to person, place, and time. She appears well-developed and well-nourished. No distress.  HENT:  Head: Normocephalic and atraumatic.  Eyes: EOM are normal. Pupils are equal, round, and reactive to light.  Neck: Normal range of motion. Neck supple.  Cardiovascular: Normal rate and regular rhythm.  Exam reveals no gallop and no friction rub.   No murmur heard. Pulmonary/Chest: Effort normal. She has no wheezes. She has no rales.  Abdominal: Soft. She exhibits no distension and no mass. There is no tenderness. There is no guarding.  Musculoskeletal: She exhibits no edema or tenderness.  Neurological: She is alert and oriented to person, place, and time. She has normal strength. No cranial nerve deficit or sensory deficit. She displays a negative Romberg sign. Coordination and gait normal. GCS eye subscore is 4. GCS verbal subscore is 5. GCS motor subscore is 6. She displays no Babinski's sign on the right side. She displays no Babinski's sign on the  left side.  Reflex Scores:      Tricep reflexes are 2+ on the right side and 2+ on the left side.      Bicep reflexes are 2+ on the right side and 2+ on the left side.      Brachioradialis reflexes are 2+ on the right side and 2+ on the left side.      Patellar reflexes are 2+ on the right side and 2+ on the left side.      Achilles reflexes are 2+ on the right side and 2+ on the left side. Skin: Skin is warm and dry. She is not diaphoretic.  Psychiatric: She has a normal mood and affect. Her behavior is normal.  Nursing note and vitals reviewed.    ED Treatments / Results  Labs (all labs ordered are listed, but only abnormal results are displayed) Labs Reviewed  CBC - Abnormal; Notable for the following:       Result Value   Hemoglobin 11.7 (*)    HCT 34.0 (*)    All other components within normal limits  COMPREHENSIVE METABOLIC PANEL - Abnormal; Notable for the following:    Glucose, Bld 222 (*)    BUN 5 (*)    Total Protein 6.1 (*)    Albumin 3.4 (*)    ALT 11 (*)    All other components within normal limits  I-STAT CHEM 8, ED - Abnormal; Notable for the following:    Potassium 3.4 (*)    BUN 5 (*)    Glucose, Bld 212 (*)    Hemoglobin 11.2 (*)    HCT 33.0 (*)    All other components within normal limits  ETHANOL  PROTIME-INR  APTT  DIFFERENTIAL  RAPID URINE DRUG SCREEN, HOSP PERFORMED  URINALYSIS, ROUTINE W REFLEX MICROSCOPIC  I-STAT TROPOININ, ED    EKG  EKG Interpretation None       Radiology Ct Head Wo Contrast  Result Date: 02/15/2016 CLINICAL DATA:  Unsteadiness. History of vertigo, hypertension, diabetes. EXAM: CT HEAD WITHOUT CONTRAST TECHNIQUE: Contiguous axial images were obtained from the base of the skull through the vertex without intravenous contrast. COMPARISON:  MRI of the head October 28, 2015 FINDINGS: BRAIN: The ventricles and sulci are normal for age. No intraparenchymal hemorrhage, mass effect nor midline shift. Old anterior lam of the  RIGHT internal capsule infarct. Patchy supratentorial white matter hypodensities less than expected for patient's  age, though non-specific are most compatible with chronic small vessel ischemic disease. No acute large vascular territory infarcts. No abnormal extra-axial fluid collections. Basal cisterns are patent. VASCULAR: Moderate calcific atherosclerosis of the carotid siphons. SKULL: No skull fracture. No significant scalp soft tissue swelling. SINUSES/ORBITS: The paranasal sinusitis, complete opacification LEFT sphenoid sinus. Mastoid air cells are well aerated. The included ocular globes and orbital contents are non-suspicious. OTHER: None. IMPRESSION: No acute intracranial process. Old RIGHT internal capsule infarct, otherwise negative CT HEAD for age. Paranasal sinusitis. Electronically Signed   By: Elon Alas M.D.   On: 02/15/2016 04:35   Mr Brain Wo Contrast  Result Date: 02/15/2016 CLINICAL DATA:  Gait imbalance, blurry vision. History of hypertension, diabetes. EXAM: MRI HEAD WITHOUT CONTRAST TECHNIQUE: Multiplanar, multiecho pulse sequences of the brain and surrounding structures were obtained without intravenous contrast. COMPARISON:  CT HEAD February 15, 2016 at 0 '4 2 5 ' hours an MRI head October 28, 2015 FINDINGS: BRAIN: No reduced diffusion to suggest acute ischemia. No susceptibility artifact to suggest hemorrhage. The ventricles and sulci are normal for patient's age. Old RIGHT basal ganglia lacunar infarct. Patchy supratentorial pontine white matter T2 hyperintensities compatible with mild chronic small vessel ischemic disease, less than expected for age. No suspicious parenchymal signal, masses or mass effect. No abnormal extra-axial fluid collections. VASCULAR: Normal major intracranial vascular flow voids present at skull base. SKULL AND UPPER CERVICAL SPINE: No abnormal sellar expansion. Severe degenerative change C3-4, C4-5, incompletely assessed. No suspicious calvarial bone marrow  signal. Craniocervical junction maintained. SINUSES/ORBITS: Lobulated paranasal sinusitis. The included ocular globes and orbital contents are non-suspicious. OTHER: Patient is edentulous. IMPRESSION: No acute intracranial process. Old RIGHT basal ganglia lacunar infarcts. Otherwise negative noncontrast MRI head for age. Electronically Signed   By: Elon Alas M.D.   On: 02/15/2016 06:12    Procedures Procedures (including critical care time)  Medications Ordered in ED Medications - No data to display   Initial Impression / Assessment and Plan / ED Course  I have reviewed the triage vital signs and the nursing notes.  Pertinent labs & imaging results that were available during my care of the patient were reviewed by me and considered in my medical decision making (see chart for details).     82 yo F With a chief complaint of unsteadiness on her feet. This been going on for the past 3 days. Patient notices most when she is up and trying to walk. Has a history of recent stroke. Patient has not been taking her home medications as prescribed. With patient's recent URI suspect that this is labyrinthitis though patient is high risk for stroke as she is our day had one and has uncontrolled hypertension. Will obtain a CT if negative obtain an MRI set evaluate for stroke.  MR negative, d/c home\  6:47 AM:  I have discussed the diagnosis/risks/treatment options with the patient and family and believe the pt to be eligible for discharge home to follow-up with PCP. We also discussed returning to the ED immediately if new or worsening sx occur. We discussed the sx which are most concerning (e.g., inability to walk) that necessitate immediate return. Medications administered to the patient during their visit and any new prescriptions provided to the patient are listed below.  Medications given during this visit Medications - No data to display   The patient appears reasonably screen and/or  stabilized for discharge and I doubt any other medical condition or other [facility] requiring further screening, evaluation, or  treatment in the ED at this time prior to discharge.    Final Clinical Impressions(s) / ED Diagnoses   Final diagnoses:  Labyrinthitis, unspecified laterality    New Prescriptions New Prescriptions   MECLIZINE (ANTIVERT) 25 MG TABLET    Take 1 tablet (25 mg total) by mouth 3 (three) times daily as needed for dizziness.     Deno Etienne, DO 02/15/16 [phone]

## 2016-02-18 ENCOUNTER — Other Ambulatory Visit: Payer: Self-pay

## 2016-02-18 ENCOUNTER — Ambulatory Visit: Payer: Medicare Other

## 2016-02-18 DIAGNOSIS — Z794 Long term (current) use of insulin: Principal | ICD-10-CM

## 2016-02-18 DIAGNOSIS — E1110 Type 2 diabetes mellitus with ketoacidosis without coma: Secondary | ICD-10-CM

## 2016-02-18 NOTE — Patient Outreach (Signed)
[facility]) Care Management  02/18/2016  Allison [name] 03/04/1934 [id number]   Telephone call to patient to follow up on ED visit on 02-15-16.  She reports she went to ED due to dizziness and elevated BP.  She was given McClizine 25mg  to be taken prn for dizziness.  Her cbg is recorded as 222 in EMR on this ED visit.  Patient reports dizziness is improved but not totally resolved.  Education provided to patient on fall precautions.  She reports no falls.  She has a cane which she uses when she goes out, but does not use any ambulatory assistance in the home.   Patient reported cough/cold symptoms on our last conversation.  She states her cough has resolved, and that she never developed a fever or other s/s of the flu.  Patient reports non-fasting cbg taken this am was 272.  She states she had breakfast, but has not yet taken her insulin.  Reinforced need to take medications as ordered.  Plan:  Patient will check cbgs and take medications as ordered.           Patient will practice fall prevention measures.           RN will follow up in March.  Candie Mile, RN, MSN [place] [phone] Fax [phone]

## 2016-02-26 ENCOUNTER — Telehealth: Payer: Self-pay | Admitting: *Deleted

## 2016-02-26 NOTE — Telephone Encounter (Signed)
Patient called and left message on Clinical Intake Line stating that she had medication concerns.  Tried calling patient back. Left message to return call.

## 2016-03-03 ENCOUNTER — Other Ambulatory Visit: Payer: Self-pay

## 2016-03-03 NOTE — Patient Outreach (Signed)
[facility]) Care Management  03/03/2016  NALEIGH BOOKHART Sep 03, 1934 [id number]  Unsuccessful attempt to reach patient by phone.  HIPAA appropriate message left requesting call back.  If no response RN will follow up in March.  Candie Mile, RN, MSN [place] [phone] Fax [phone]

## 2016-03-05 ENCOUNTER — Telehealth: Payer: Self-pay | Admitting: *Deleted

## 2016-03-05 NOTE — Telephone Encounter (Signed)
Received fax from [facility] for Diabetic supplies One Touch Ultra Test Strips (use to test blood sugar three times daily). Filled out and faxed back to [place] Fax: [phone].

## 2016-03-10 ENCOUNTER — Ambulatory Visit: Payer: Medicare Other | Admitting: Nurse Practitioner

## 2016-03-11 ENCOUNTER — Encounter: Payer: Self-pay | Admitting: Nurse Practitioner

## 2016-03-18 ENCOUNTER — Ambulatory Visit (INDEPENDENT_AMBULATORY_CARE_PROVIDER_SITE_OTHER): Payer: Medicare Other | Admitting: Podiatry

## 2016-03-18 VITALS — BP 169/90 | HR 82

## 2016-03-18 DIAGNOSIS — B351 Tinea unguium: Secondary | ICD-10-CM | POA: Diagnosis not present

## 2016-03-18 DIAGNOSIS — M79671 Pain in right foot: Secondary | ICD-10-CM

## 2016-03-18 DIAGNOSIS — M79672 Pain in left foot: Secondary | ICD-10-CM

## 2016-03-18 NOTE — Progress Notes (Signed)
Subjective: 82 year old female presents complaining of painful toe nails.  Had a stroke in October 2017. Complains no after effect. Using a cane as per advice   Objective: Thick dystrophic nails x 10 with yellow fungal debris under nail plate of both great toes.  Pedal pulses are not palpable.  No edema or erythema noted. No abnormal skin lesions noted.  Dry peeling skin posterior right heel.  Assessment: Mycotic nails bilateral. Diabetic Neuropathy. PVD.  Plan: Debrided all nails.  Return in 3 months or as needed.

## 2016-03-18 NOTE — Patient Instructions (Signed)
Seen for hypertrophic nails. All nails debrided. Return in 3 months or as needed.  

## 2016-03-19 ENCOUNTER — Encounter: Payer: Self-pay | Admitting: Podiatry

## 2016-03-19 ENCOUNTER — Other Ambulatory Visit: Payer: Self-pay

## 2016-03-19 DIAGNOSIS — E1110 Type 2 diabetes mellitus with ketoacidosis without coma: Secondary | ICD-10-CM

## 2016-03-19 DIAGNOSIS — Z794 Long term (current) use of insulin: Principal | ICD-10-CM

## 2016-03-19 NOTE — Patient Outreach (Signed)
[facility]) Care Management  03/19/2016  Allison [name] 10/01/1934 [id number]   1PM call back to check on patient.  In conversation earlier this am patient reported cbg reading was 600/error report.  Patient had not taken her Insulin or her metformin.  Patient reports she took her insulin and metformin and drank 2 bottles of water as directed after we spoke earlier this am.  She rechecked her cbg and reports it was 331.  Patient reports she is now out of strips and will get some this afternoon so that she can monitor cbgs.  Reinforced need to take meds as directed, and to keep a written record of her blood sugars.  Plan:  Patient will take meds as ordered.           Patient will adhere to diabetic dietary guidelines.           Patient will contact PCP as needed for elevated blood sugars.           RN will follow up within 10 days.  Candie Mile, RN, MSN [place] [phone] Fax [phone]

## 2016-03-19 NOTE — Patient Outreach (Signed)
[facility]) Care Management  03/19/2016  HUYEN PERAZZO [birth year] [id number]  Telephone contact with patient.  She reports she just got a new glucometer and is still learning how to use it.  She reported she had eaten breakfast but had not yet taken her insulin.    RN asked patient to check cbg and waited for result.  Patient reported she got an "error" result, and then a sign that cbg was 600.  RN instructed patient to take her insulin and metformin, and to drink water, and then recheck cbg. RN will call back to check on results.  Candie Mile, RN, MSN [place] [phone] Fax [phone]

## 2016-03-26 ENCOUNTER — Other Ambulatory Visit: Payer: Self-pay

## 2016-03-26 NOTE — Patient Outreach (Signed)
[facility]) Care Management  03/26/2016  Allison [name] Jan 10, 1935 [id number]  Unsuccessful attempt to reach patient.  Busy signal obtained x 2, so unable to leave a message. RN will make another attempt to reach patient within 10 days.  Candie Mile, RN, MSN [place] [phone] Fax [phone]

## 2016-03-27 ENCOUNTER — Ambulatory Visit (INDEPENDENT_AMBULATORY_CARE_PROVIDER_SITE_OTHER): Payer: Medicare Other | Admitting: Nurse Practitioner

## 2016-03-27 ENCOUNTER — Encounter: Payer: Self-pay | Admitting: Nurse Practitioner

## 2016-03-27 VITALS — BP 143/77 | HR 56 | Ht 64.0 in | Wt 143.6 lb

## 2016-03-27 DIAGNOSIS — E785 Hyperlipidemia, unspecified: Secondary | ICD-10-CM | POA: Diagnosis not present

## 2016-03-27 DIAGNOSIS — I10 Essential (primary) hypertension: Secondary | ICD-10-CM

## 2016-03-27 DIAGNOSIS — I63019 Cerebral infarction due to thrombosis of unspecified vertebral artery: Secondary | ICD-10-CM | POA: Diagnosis not present

## 2016-03-27 NOTE — Progress Notes (Signed)
[facility]  PATIENT: Allison Allison [name] DOB: 1934-05-18   REASON FOR VISIT: follow-up for stroke HISTORY FROM: Patient    HISTORY OF PRESENT ILLNESS:UPDATE 03/27/2016 CM Allison Allison [name], 82 year old Allison [name] returns for follow-up for stroke which occurred in October 2017. She is currently on Plavix for secondary stroke prevention without further stroke or TIA symptoms. She has minimal bruising and no bleeding.Her most recent hemoglobin A1c in February was 8.0 down from 11.2 in October. She remains on Lipitor for hyperlipidemia and she denies any muscle aches.She is getting little exercise and she is not doing her home exercise program provided by physical therapy. She denies any swallowing difficulty,  speech difficulty,weakness or visual changes.She denies any falls she does not use an assistive device. She returns for reevaluation  HISTORY 11/21/17CM83 year old Allison [name] with a history of diabetes mellitus, hyperlipidemia, hypertension, hypothyroidism, medical noncompliance presented with sudden onset of headache and visual hallucinations on 10/28/2015. In addition, the patient's sister states that the patient has had some slurred speech and gait instability for the past week. Sister states that the patient has been "tripping over her right foot". The patient reports seeing her mother in the kitchen. Allison Allison [name] died in 2009/03/29. The hallucinations persisted. As a result, the sister decided to bring the patient to the emergency department. In the emergency department, the patient was afebrile and hemodynamically stable. CT of the brain showed possible right internal capsule infarct involving the right caudate nucleus. She was admitted to the stroke service. MRI right internal capsule infarct. MRA no emergent large vessel occlusion or severe stenosis right proximal PCA right VA. Moderate stenosis BA left ICA left MCA left ACA. Carotid Doppler no significant stenosis EEG no seizure  activity. LDL 213 hemoglobin A1c 11.2.  2-D echo EF 55-60%. She was placed on Plavix from aspirin. She returns to the stroke clinic today for follow-up. She remains on Plavix for secondary stroke prevention and has not had further stroke or TIA symptoms in addition she's had some adjustments to her insulin. She remains on Lipitor 40 mg daily. She received some outpatient physical therapy but that has concluded. She was encouraged to do home exercise program and to be more active. She denies any weakness visual changes speech or swallowing difficulty or increased confusion since discharge. She returns for reevaluation   REVIEW OF SYSTEMS: Full 14 system review of systems performed and notable only for those listed, all others are neg:  Constitutional: Fatigue Cardiovascular: neg Ear/Nose/Throat: neg  Skin: neg Eyes: neg Respiratory: neg Gastroitestinal: neg  Hematology/Lymphatic: neg  Endocrine: neg Musculoskeletal:neg Allergy/Immunology: neg Neurological: neg Psychiatric: neg Sleep : neg   ALLERGIES: Allergies  Allergen Reactions  . Lantus [Insulin Glargine] Itching and Swelling    angioedema  . Levemir [Insulin Detemir] Itching and Swelling    angioedema  . Pineapple Anaphylaxis  . Codeine Other (See Comments)    unknown  . Invokana [Canagliflozin] Other (See Comments)    Constipation, nightmares  . Penicillins Other (See Comments)    Unknown   . Tradjenta [Linagliptin] Other (See Comments)  . Vioxx [Rofecoxib] Other (See Comments)    unknown    HOME MEDICATIONS: Outpatient Medications Prior to Visit  Medication Sig Dispense Refill  . atorvastatin (LIPITOR) 40 MG tablet take 1 tablet by mouth at bedtime 30 tablet 3  . betamethasone dipropionate (DIPROLENE) 0.05 % cream Apply topically 2 (two) times daily.    . blood glucose meter kit and supplies KIT Check blood glucose twice daily before insulin therapy (  ICD-10 E11.21, E11.65). 1 each 0  . chlorthalidone (HYGROTON) 25  MG tablet Take 0.5 tablets (12.5 mg total) by mouth daily. 30 tablet 1  . citalopram (CELEXA) 10 MG tablet Take 1 tablet (10 mg total) by mouth daily. For anxiety 30 tablet 6  . clopidogrel (PLAVIX) 75 MG tablet Take 1 tablet (75 mg total) by mouth daily. 30 tablet 3  . gabapentin (NEURONTIN) 100 MG capsule Take 200 mg by mouth 3 (three) times daily as needed (nerve pain).     Marland Kitchen glucose blood (ONE TOUCH ULTRA TEST) test strip Use to test blood sugar three times daily. Dx: E11.21 100 each 11  . insulin aspart (NOVOLOG FLEXPEN) 100 UNIT/ML FlexPen Inject 14 Units into the skin 2 (two) times daily.    Marland Kitchen levothyroxine (SYNTHROID, LEVOTHROID) 150 MCG tablet take 1 tablet by mouth every morning ON AN EMPTY STOMACH for THROID 30 tablet 3  . meclizine (ANTIVERT) 25 MG tablet Take 1 tablet (25 mg total) by mouth 3 (three) times daily as needed for dizziness. 30 tablet 0  . metFORMIN (GLUCOPHAGE) 500 MG tablet take 2 tablets by mouth twice a day with food 180 tablet 3  . omeprazole (PRILOSEC) 20 MG capsule take 1 capsule by mouth once daily 30 capsule 3  . ONETOUCH DELICA LANCETS 41L MISC Use to test blood sugar three times daily. Dx: E11.21 100 each 6  . vitamin E (VITAMIN E) 1000 UNIT capsule Take 1,000 Units by mouth daily.    Marland Kitchen dextromethorphan-guaiFENesin (MUCINEX DM) 30-600 MG 12hr tablet One twice daily to suppress cough and congestion (Patient not taking: Reported on 03/27/2016) 20 tablet 1   No facility-administered medications prior to visit.     PAST MEDICAL HISTORY: Past Medical History:  Diagnosis Date  . Carpal tunnel syndrome   . Diabetes mellitus without complication ([place])   . Dysphagia   . GERD (gastroesophageal reflux disease)   . Hiatal hernia   . HLD (hyperlipidemia)   . Hx of adenomatous colonic polyps 06/19/2014  . Hypertension   . Hypothyroidism   . Onychomycosis 03/05/2012  . Pain in lower limb 04/18/2013  . Sciatic nerve pain   . Vertigo     PAST SURGICAL HISTORY: Past  Surgical History:  Procedure Laterality Date  . BLADDER SURGERY     Mesh implant  . COLONOSCOPY    . LARYNX SURGERY     vocal cords  . PARTIAL HYSTERECTOMY  1978  . VESICOVAGINAL FISTULA CLOSURE W/ TAH  1976    FAMILY HISTORY: Family History  Problem Relation Age of Onset  . Alzheimer's disease Mother   . Glaucoma Mother   . Heart disease Father   . Alzheimer's disease Father   . Asthma Sister   . Allergies Sister   . Allergies Sister   . Sleep apnea Sister   . Heart disease Sister   . Breast cancer Maternal Aunt   . Pancreatic cancer Maternal Aunt   . Prostate cancer Maternal Uncle   . Prostate cancer Paternal Uncle   . Alcoholism Maternal Uncle   . Kidney disease Maternal Aunt   . Heart disease Maternal Aunt     SOCIAL HISTORY: Social History   Social History  . Marital status: Single    Spouse name: N/A  . Number of children: 2  . Years of education: N/A   Occupational History  . Retired    Social History Main Topics  . Smoking status: Never Smoker  . Smokeless tobacco: Never  Used  . Alcohol use No  . Drug use: No  . Sexual activity: No   Other Topics Concern  . Not on file   Social History Narrative   Single   2 sons   Retired   2 cups caffeine daily   05/01/2014        PHYSICAL EXAM  Vitals:   03/27/16 0955  BP: (!) 143/77  Pulse: (!) 56  Weight: 143 lb 9.6 oz (65.1 kg)  Height: '5\' 4"'  (1.626 m)   Body mass index is 24.65 kg/m.  Generalized: Well developed, in no acute distress  Head: normocephalic and atraumatic,. Oropharynx benign  Neck: Supple, no carotid bruits  Cardiac: Regular rate rhythm, no murmur  Musculoskeletal: No deformity   Neurological examination   Mentation: Alert oriented to time, place, history taking. Attention span and concentration appropriate. Recent and remote memory intact.  Follows all commands speech and language fluent.   Cranial nerve II-XII: .Pupils were equal round reactive to light extraocular  movements were full, visual field were full on confrontational test. Facial sensation and strength were normal. hearing was intact to finger rubbing bilaterally. Uvula tongue midline. head turning and shoulder shrug were normal and symmetric.Tongue protrusion into cheek strength was normal. Motor: normal bulk and tone, full strength in the BUE, BLE, fine finger movements normal, no pronator drift.  Sensory: normal and symmetric to light touch, pinprick, and  Vibration, in the upper and lower extremities Coordination: finger-nose-finger, heel-to-shin bilaterally, no dysmetria, no tremor Reflexes: 1+ upper lower and symmetric, plantar responses were flexor bilaterally. Gait and Station: Rising up from seated position without assistance, normal stance,  moderate stride, good arm swing, smooth turning, able to perform tiptoe, and heel walking without difficulty. Tandem gait is steady. No assistive device  DIAGNOSTIC DATA (LABS, IMAGING, TESTING) - I reviewed patient records, labs, notes, testing and imaging myself where available.  Lab Results  Component Value Date   WBC 7.0 02/15/2016   HGB 11.2 (L) 02/15/2016   HCT 33.0 (L) 02/15/2016   MCV 80.2 02/15/2016   PLT 246 02/15/2016      Component Value Date/Time   NA 143 02/15/2016 0427   NA 139 05/22/2015 0845   K 3.4 (L) 02/15/2016 0427   CL 105 02/15/2016 0427   CO2 27 02/15/2016 0417   GLUCOSE 212 (H) 02/15/2016 0427   BUN 5 (L) 02/15/2016 0427   BUN 10 05/22/2015 0845   CREATININE 0.80 02/15/2016 0427   CREATININE 0.92 02/13/2016 1425   CALCIUM 9.2 02/15/2016 0417   CALCIUM 10.7 (H) 08/05/2013 2053   PROT 6.1 (L) 02/15/2016 0417   PROT 7.0 05/22/2015 0845   ALBUMIN 3.4 (L) 02/15/2016 0417   ALBUMIN 4.0 05/22/2015 0845   AST 17 02/15/2016 0417   ALT 11 (L) 02/15/2016 0417   ALKPHOS 47 02/15/2016 0417   BILITOT 0.7 02/15/2016 0417   BILITOT 0.5 05/22/2015 0845   GFRNONAA >60 02/15/2016 0417   GFRNONAA 59 (L) 02/13/2016 1425    GFRAA >60 02/15/2016 0417   GFRAA 68 02/13/2016 1425   Lab Results  Component Value Date   CHOL 210 (H) 02/13/2016   HDL 44 (L) 02/13/2016   LDLCALC 130 (H) 02/13/2016   TRIG 182 (H) 02/13/2016   CHOLHDL 4.8 02/13/2016   Lab Results  Component Value Date   HGBA1C 8.0 (H) 02/13/2016   Lab Results  Component Value Date   VITAMINB12 630 10/28/2015   Lab Results  Component Value Date   TSH  1.885 10/28/2015      ASSESSMENT AND PLAN  82 y.o. year old Allison [name]  has a past medical history of  diabetes mellitus, hyperlipidemia, hypertension,  medical noncompliance presented with sudden onset of headache and visual hallucinations on 10/28/2015. In addition, the patient's sister states that the patient has had some slurred speech and gait instability for the past week.MRI  Of the brain right internal capsule infarct. She returns for  follow-up    PLAN: Stressed the importance of management of risk factors to prevent further stroke Continue Plavix for secondary stroke prevention Maintain strict control of hypertension with blood pressure goal below 130/90, today's reading 143/77  Control of diabetes with hemoglobin A1c below 6.5 followed by primary care most recent hemoglobin A1c8.0 continue diabetic medications Cholesterol with LDL cholesterol less than 70, followed by primary care,  continue Lipitor Exercise by walking, slowly increase , eat healthy diet with whole grains,  fresh fruits and vegetables , Join Silver Sneakers Follow-up in 6 months I spent 20 min in total face to face time with the patient more than 50% of which was spent counseling and coordination of care, reviewing test results reviewing medications and discussing and reviewing the diagnosis of stroke and the importance of staying in compliance with her plan of care.  Dennie Bible, [facility], [facility], APRN  [facility] [address], [place], [place] [address] [phone]

## 2016-03-27 NOTE — Progress Notes (Signed)
I agree with the above plan 

## 2016-03-27 NOTE — Patient Instructions (Signed)
Stressed the importance of management of risk factors to prevent further stroke Continue Plavix for secondary stroke prevention Maintain strict control of hypertension with blood pressure goal below 130/90, today's reading 143/77  Control of diabetes with hemoglobin A1c below 6.5 followed by primary care most recent hemoglobin A1c8.0 continue diabetic medications Cholesterol with LDL cholesterol less than 70, followed by primary care,  continue Lipitor Exercise by walking, slowly increase , eat healthy diet with whole grains,  fresh fruits and vegetables Join Silver Sneakers Follow-up in 6 months

## 2016-03-30 DIAGNOSIS — N3941 Urge incontinence: Secondary | ICD-10-CM | POA: Diagnosis not present

## 2016-03-30 DIAGNOSIS — R339 Retention of urine, unspecified: Secondary | ICD-10-CM | POA: Diagnosis not present

## 2016-03-30 DIAGNOSIS — Z87898 Personal history of other specified conditions: Secondary | ICD-10-CM | POA: Diagnosis not present

## 2016-04-01 ENCOUNTER — Telehealth: Payer: Self-pay | Admitting: Nurse Practitioner

## 2016-04-01 ENCOUNTER — Ambulatory Visit: Payer: Medicare Other | Admitting: Internal Medicine

## 2016-04-01 NOTE — Telephone Encounter (Signed)
Spoke to pt.  She noted of having nausea, headache for the last week, diarrhea, feeling off balance (walking to the right/going backward).  She took meclizine is slept, feels better today.  She also mentioned feeling hot/ flushed.  I told her that would contact pcp for eval, could be a number of things.  If dehydrated can get dizzy from this.  Recommended drinking fluids and contact her family MD (Dr. Gildardo Cranker, DO).  She verbalized understanding.

## 2016-04-01 NOTE — Telephone Encounter (Signed)
Pt asking for a call on what she should do, pt said she has been feeling nauseated and no matter how hard she tries she is walking backward and feeling hot and cold. Pt said she is also having complications with her bowels. Pt can be reached on home#

## 2016-04-02 ENCOUNTER — Emergency Department (HOSPITAL_COMMUNITY): Payer: Medicare Other

## 2016-04-02 ENCOUNTER — Encounter (HOSPITAL_COMMUNITY): Payer: Self-pay

## 2016-04-02 ENCOUNTER — Telehealth: Payer: Self-pay

## 2016-04-02 ENCOUNTER — Emergency Department (HOSPITAL_COMMUNITY)
Admission: EM | Admit: 2016-04-02 | Discharge: 2016-04-02 | Disposition: A | Discharge status: Home / Self Care | Payer: Medicare Other | Attending: Emergency Medicine | Admitting: Emergency Medicine

## 2016-04-02 ENCOUNTER — Other Ambulatory Visit: Payer: Self-pay

## 2016-04-02 DIAGNOSIS — N390 Urinary tract infection, site not specified: Secondary | ICD-10-CM | POA: Diagnosis not present

## 2016-04-02 DIAGNOSIS — R42 Dizziness and giddiness: Secondary | ICD-10-CM | POA: Insufficient documentation

## 2016-04-02 DIAGNOSIS — E039 Hypothyroidism, unspecified: Secondary | ICD-10-CM | POA: Diagnosis not present

## 2016-04-02 DIAGNOSIS — Z794 Long term (current) use of insulin: Principal | ICD-10-CM

## 2016-04-02 DIAGNOSIS — E119 Type 2 diabetes mellitus without complications: Secondary | ICD-10-CM | POA: Insufficient documentation

## 2016-04-02 DIAGNOSIS — S299XXA Unspecified injury of thorax, initial encounter: Secondary | ICD-10-CM | POA: Diagnosis not present

## 2016-04-02 DIAGNOSIS — I10 Essential (primary) hypertension: Secondary | ICD-10-CM | POA: Insufficient documentation

## 2016-04-02 DIAGNOSIS — Z8673 Personal history of transient ischemic attack (TIA), and cerebral infarction without residual deficits: Secondary | ICD-10-CM | POA: Diagnosis not present

## 2016-04-02 DIAGNOSIS — E1110 Type 2 diabetes mellitus with ketoacidosis without coma: Secondary | ICD-10-CM

## 2016-04-02 DIAGNOSIS — R51 Headache: Secondary | ICD-10-CM | POA: Diagnosis not present

## 2016-04-02 DIAGNOSIS — Z79899 Other long term (current) drug therapy: Secondary | ICD-10-CM | POA: Diagnosis not present

## 2016-04-02 LAB — CBC
HCT: 40.3 % (ref 36.0–46.0)
HEMOGLOBIN: 13.5 g/dL (ref 12.0–15.0)
MCH: 26.9 pg (ref 26.0–34.0)
MCHC: 33.5 g/dL (ref 30.0–36.0)
MCV: 80.4 fL (ref 78.0–100.0)
PLATELETS: 236 10*3/uL (ref 150–400)
RBC: 5.01 MIL/uL (ref 3.87–5.11)
RDW: 14.6 % (ref 11.5–15.5)
WBC: 7.5 10*3/uL (ref 4.0–10.5)

## 2016-04-02 LAB — DIFFERENTIAL
BASOS ABS: 0.1 10*3/uL (ref 0.0–0.1)
BASOS PCT: 1 %
EOS ABS: 0.3 10*3/uL (ref 0.0–0.7)
Eosinophils Relative: 4 %
LYMPHS ABS: 2.2 10*3/uL (ref 0.7–4.0)
Lymphocytes Relative: 30 %
Monocytes Absolute: 0.5 10*3/uL (ref 0.1–1.0)
Monocytes Relative: 7 %
NEUTROS ABS: 4.3 10*3/uL (ref 1.7–7.7)
NEUTROS PCT: 58 %

## 2016-04-02 LAB — URINALYSIS, ROUTINE W REFLEX MICROSCOPIC
BILIRUBIN URINE: NEGATIVE
Glucose, UA: >=500 mg/dL — AB
Hgb urine dipstick: NEGATIVE
KETONES UR: NEGATIVE mg/dL
Nitrite: NEGATIVE
PH: 5 (ref 5.0–8.0)
Protein, ur: NEGATIVE mg/dL
SPECIFIC GRAVITY, URINE: 1.01 (ref 1.005–1.030)

## 2016-04-02 LAB — BASIC METABOLIC PANEL
ANION GAP: 10 (ref 5–15)
BUN: 8 mg/dL (ref 6–20)
CALCIUM: 9.7 mg/dL (ref 8.9–10.3)
CO2: 28 mmol/L (ref 22–32)
CREATININE: 0.78 mg/dL (ref 0.44–1.00)
Chloride: 101 mmol/L (ref 101–111)
Glucose, Bld: 180 mg/dL — ABNORMAL HIGH (ref 65–99)
Potassium: 3.2 mmol/L — ABNORMAL LOW (ref 3.5–5.1)
SODIUM: 139 mmol/L (ref 135–145)

## 2016-04-02 LAB — I-STAT TROPONIN, ED: Troponin i, poc: 0 ng/mL (ref 0.00–0.08)

## 2016-04-02 LAB — ETHANOL

## 2016-04-02 LAB — CBG MONITORING, ED: GLUCOSE-CAPILLARY: 189 mg/dL — AB (ref 65–99)

## 2016-04-02 MED ORDER — CEPHALEXIN 500 MG PO CAPS: 500.0000 mg | ORAL_CAPSULE | Freq: Two times a day (BID) | ORAL | 0 refills | Status: DC

## 2016-04-02 MED ORDER — SODIUM CHLORIDE 0.9 % IV BOLUS (SEPSIS)
500.0000 mL | Freq: Once | INTRAVENOUS | Status: AC
  Administered 2016-04-02: 500 mL via INTRAVENOUS

## 2016-04-02 NOTE — ED Provider Notes (Signed)
[facility] DEPT Provider Note   CSN: [id number] Arrival date & time: 04/02/16  1004     History   Chief Complaint Chief Complaint  Patient presents with  . Headache  . Gait Problem    HPI ANDRE [name] is a 82 y.o. female.  The history is provided by the patient. No language interpreter was used.  Headache     Allison [name] is a 82 y.o. female who presents to the Emergency Department complaining of headache, difficulty walking.  This [place] presents for evaluation of dizziness and difficulty walking. She reports 3 days of headache that resolved yesterday and since that time she's been having persistent dizziness and difficulty walking. She states that when she goes to walk she feels that she is going to fall backwards or to the side. She has associated nausea, vomiting, malaise. She also endorses urinary frequency and foul-smelling urine at times. Her son thinks that she has been eating less and drinking less and not following her diet appropriately and is inconsistent with her medications. He is concerned for dehydration. No reports of fevers, head injury, chest pain, shortness of breath, abdominal pain. She had prior similar symptoms in the past and was diagnosed with a stroke. Past Medical History:  Diagnosis Date  . Carpal tunnel syndrome   . Diabetes mellitus without complication ([place])   . Dysphagia   . GERD (gastroesophageal reflux disease)   . Hiatal hernia   . HLD (hyperlipidemia)   . Hx of adenomatous colonic polyps 06/19/2014  . Hypertension   . Hypothyroidism   . Onychomycosis 03/05/2012  . Pain in lower limb 04/18/2013  . Sciatic nerve pain   . Vertigo     Patient Active Problem List   Diagnosis Date Noted  . ARI (acute respiratory infection) 01/22/2016  . Uncontrolled type 2 diabetes mellitus with hyperglycemia, with long-term current use of insulin ([place]) 10/29/2015  . Acute ischemic stroke ([place]) 10/29/2015  . Hallucinations   . Visual hallucinations  10/28/2015  . Stroke-like symptoms 10/28/2015  . CVA (cerebral vascular accident) ([place]) 10/28/2015  . Ischemic stroke ([place])   . Diabetes mellitus with complication ([place])   . Hx of adenomatous colonic polyps 06/19/2014  . Neuropathic pain 03/27/2014  . Uncontrolled type 2 diabetes with neuropathy ([place]) 03/27/2014  . Bilateral carpal tunnel syndrome 03/27/2014  . AKI (acute kidney injury) ([place]) 08/05/2013  . DM type 2, uncontrolled, with renal complications ([place]) 70/01/7492  . Hypercalcemia 08/05/2013  . PVD (peripheral vascular disease) ([place]) 03/05/2012  . Onychomycosis 03/05/2012  . Other hammer toe (acquired) 03/05/2012  . Abnormal CXR 12/25/2011  . Hypothyroidism 06/23/2006  . Dyslipidemia 06/23/2006  . Essential hypertension 06/23/2006  . GERD 06/23/2006  . IBS 06/23/2006    Past Surgical History:  Procedure Laterality Date  . BLADDER SURGERY     Mesh implant  . COLONOSCOPY    . LARYNX SURGERY     vocal cords  . PARTIAL HYSTERECTOMY  1978  . VESICOVAGINAL FISTULA CLOSURE W/ TAH  1976    OB History    No data available       Home Medications    Prior to Admission medications   Medication Sig Start Date End Date Taking? Authorizing Provider  atorvastatin (LIPITOR) 40 MG tablet take 1 tablet by mouth at bedtime 07/11/15   Gildardo Cranker, DO  betamethasone dipropionate (DIPROLENE) 0.05 % cream Apply topically 2 (two) times daily.    Historical Provider, MD  blood glucose meter kit and supplies  KIT Check blood glucose twice daily before insulin therapy (ICD-10 E11.21, E11.65). 11/03/15   [name] Reed, DO  cephALEXin (KEFLEX) 500 MG capsule Take 1 capsule (500 mg total) by mouth 2 (two) times daily. 04/02/16   Quintella Reichert, MD  chlorthalidone (HYGROTON) 25 MG tablet Take 0.5 tablets (12.5 mg total) by mouth daily. 11/01/15   Eber Jones, MD  citalopram (CELEXA) 10 MG tablet Take 1 tablet (10 mg total) by mouth daily. For anxiety 01/01/16   Gildardo Cranker, DO    clopidogrel (PLAVIX) 75 MG tablet Take 1 tablet (75 mg total) by mouth daily. 01/16/16   Gildardo Cranker, DO  gabapentin (NEURONTIN) 100 MG capsule Take 200 mg by mouth 3 (three) times daily as needed (nerve pain).     Historical Provider, MD  glucose blood (ONE TOUCH ULTRA TEST) test strip Use to test blood sugar three times daily. Dx: E11.21 02/13/16   Lauree Chandler, NP  insulin aspart (NOVOLOG FLEXPEN) 100 UNIT/ML FlexPen Inject 14 Units into the skin 2 (two) times daily.    Historical Provider, MD  levothyroxine (SYNTHROID, LEVOTHROID) 150 MCG tablet take 1 tablet by mouth every morning ON AN EMPTY STOMACH for THROID 01/31/16   Gildardo Cranker, DO  meclizine (ANTIVERT) 25 MG tablet Take 1 tablet (25 mg total) by mouth 3 (three) times daily as needed for dizziness. 02/15/16   Deno Etienne, DO  metFORMIN (GLUCOPHAGE) 500 MG tablet take 2 tablets by mouth twice a day with food 01/16/16   Gildardo Cranker, DO  omeprazole (PRILOSEC) 20 MG capsule take 1 capsule by mouth once daily 07/11/15   Gildardo Cranker, DO  [facility] DELICA LANCETS 01S MISC Use to test blood sugar three times daily. Dx: E11.21 12/23/15   Gildardo Cranker, DO  vitamin E (VITAMIN E) 1000 UNIT capsule Take 1,000 Units by mouth daily.    Historical Provider, MD    Family History Family History  Problem Relation Age of Onset  . Alzheimer's disease Mother   . Glaucoma Mother   . Heart disease Father   . Alzheimer's disease Father   . Asthma Sister   . Allergies Sister   . Allergies Sister   . Sleep apnea Sister   . Heart disease Sister   . Breast cancer Maternal Aunt   . Pancreatic cancer Maternal Aunt   . Prostate cancer Maternal Uncle   . Prostate cancer Paternal Uncle   . Alcoholism Maternal Uncle   . Kidney disease Maternal Aunt   . Heart disease Maternal Aunt     Social History Social History  Substance Use Topics  . Smoking status: Never Smoker  . Smokeless tobacco: Never Used  . Alcohol use No     Allergies   Lantus  [insulin glargine]; Levemir [insulin detemir]; Pineapple; Codeine; Invokana [canagliflozin]; Penicillins; Tradjenta [linagliptin]; and Vioxx [rofecoxib]   Review of Systems Review of Systems  Neurological: Positive for headaches.  All other systems reviewed and are negative.    Physical Exam Updated Vital Signs BP (!) 144/95   Pulse 60   Temp 97.5 F (36.4 C) (Oral)   Resp 18   Ht 5' 4" (1.626 m)   Wt 146 lb (66.2 kg)   SpO2 99%   BMI 25.06 kg/m   Physical Exam  Constitutional: She is oriented to person, place, and time. She appears well-developed and well-nourished.  HENT:  Head: Normocephalic and atraumatic.  Eyes: EOM are normal. Pupils are equal, round, and reactive to light.  Neck: Neck  supple.  Cardiovascular: Normal rate and regular rhythm.   No murmur heard. Pulmonary/Chest: Effort normal and breath sounds normal. No respiratory distress.  Abdominal: Soft. There is no tenderness. There is no rebound and no guarding.  Musculoskeletal: She exhibits no edema or tenderness.  Neurological: She is alert and oriented to person, place, and time. No cranial nerve deficit.  5 out of 5 strength in all four extremities  Skin: Skin is warm and dry.  Psychiatric: She has a normal mood and affect. Her behavior is normal.  Nursing note and vitals reviewed.    ED Treatments / Results  Labs (all labs ordered are listed, but only abnormal results are displayed) Labs Reviewed  BASIC METABOLIC PANEL - Abnormal; Notable for the following:       Result Value   Potassium 3.2 (*)    Glucose, Bld 180 (*)    All other components within normal limits  URINALYSIS, ROUTINE W REFLEX MICROSCOPIC - Abnormal; Notable for the following:    Glucose, UA >=500 (*)    Leukocytes, UA MODERATE (*)    Bacteria, UA RARE (*)    Squamous Epithelial / LPF 0-5 (*)    All other components within normal limits  CBG MONITORING, ED - Abnormal; Notable for the following:    Glucose-Capillary 189 (*)      All other components within normal limits  URINE CULTURE  CBC  ETHANOL  DIFFERENTIAL  I-STAT TROPOININ, ED    EKG  EKG Interpretation  Date/Time:  Thursday April 02 2016 12:01:04 EDT Ventricular Rate:  60 PR Interval:    QRS Duration: 77 QT Interval:  403 QTC Calculation: 403 R Axis:   25 Text Interpretation:  Sinus rhythm Low voltage, precordial leads Confirmed by Hazle Coca [phone]) on 04/02/2016 12:11:48 PM       Radiology Dg Chest 2 View  Result Date: 04/02/2016 CLINICAL DATA:  Dizziness, fall this morning EXAM: CHEST  2 VIEW COMPARISON:  01/28/2016 FINDINGS: Cardiomediastinal silhouette is stable. No infiltrate or pleural effusion. No pulmonary edema. Degenerative changes mid and lower thoracic spine. IMPRESSION: No active cardiopulmonary disease. Electronically Signed   By: Lahoma Crocker M.D.   On: 04/02/2016 13:04   Ct Head Wo Contrast  Result Date: 04/02/2016 CLINICAL DATA:  Nausea and headaches EXAM: CT HEAD WITHOUT CONTRAST TECHNIQUE: Contiguous axial images were obtained from the base of the skull through the vertex without intravenous contrast. COMPARISON:  02/15/2016 FINDINGS: Brain: Stable lacunar infarct is noted within the internal capsule on the right. No acute infarct, acute hemorrhage or space-occupying mass lesion is noted. Vascular: No hyperdense vessel or unexpected calcification. Skull: Normal. Negative for fracture or focal lesion. Sinuses/Orbits: No acute finding. Other: None. IMPRESSION: Chronic ischemic changes without acute abnormality. Electronically Signed   By: Inez Catalina M.D.   On: 04/02/2016 12:46    Procedures Procedures (including critical care time)  Medications Ordered in ED Medications  sodium chloride 0.9 % bolus 500 mL (0 mLs Intravenous Stopped 04/02/16 1405)     Initial Impression / Assessment and Plan / ED Course  I have reviewed the triage vital signs and the nursing notes.  Pertinent labs & imaging results that were available  during my care of the patient were reviewed by me and considered in my medical decision making (see chart for details).   Patient here for evaluation of headache, dizziness, nausea. Her headaches, nausea, dizziness have resolved. She is able to able to walk steadily in the emergency department. Current clinical  picture is not consistent with recurrent CVA, SAH.  UA was concerning for developing UTI, we will treat for cystitis. Counseled patient and son on home care, oral fluid hydration, outpatient follow up and return precautions.  Final Clinical Impressions(s) / ED Diagnoses   Final diagnoses:  Lower urinary tract infectious disease  Dizziness    New Prescriptions Discharge Medication List as of 04/02/2016  2:16 PM    START taking these medications   Details  cephALEXin (KEFLEX) 500 MG capsule Take 1 capsule (500 mg total) by mouth 2 (two) times daily., Starting Thu 04/02/2016, Print         Quintella Reichert, MD 04/02/16 1537

## 2016-04-02 NOTE — ED Notes (Signed)
cbg was 189

## 2016-04-02 NOTE — Telephone Encounter (Signed)
Patient called stating she had a stroke in October and she is having similar symptoms. Patient has peed on herself x 2 and is unable to control urination, patient is off balance and when she stands falls back down (patient said it feels like something is pulling her down)  I informed patient to call 911 and seek medical attention as soon as possible. Patient states she has a nephew and a son, someone will take her to the emergency room for evaluation.  Message forwarded to covering provider (Dr.Carter out of office until 05/27/16)

## 2016-04-02 NOTE — ED Triage Notes (Signed)
Per Pt, Pt is coming from home with "balance problem, "nausea, headache, and incontinence that started in the last four days. Pt reports calling PCP and was requested to come over to be evaluated. Pt has HX of Stroke and Diabetes.

## 2016-04-02 NOTE — ED Notes (Signed)
Pt ambulated to the bathroom with a steady gait.

## 2016-04-02 NOTE — ED Notes (Signed)
Patient transported to CT 

## 2016-04-02 NOTE — Patient Outreach (Signed)
[facility] [facility]) Care Management  04/02/2016  Allison [name] 02-Jul-1934 [id number]   Received In-Basket notification earlier today that patient was at the [facility] ED.  She was evaluated and discharged back home with an antibiotic prescription for a UTI.  Unsuccessful attempt to speak with patient after she arrived back home.  HIPAA appropriate message left with her caregiver/sister.  Plan: Refer patient back to community due to complex care needs and frequent use of ED.          Notify PCP and CMA of same.  Candie Mile, RN, MSN [place] [phone] Fax [phone]

## 2016-04-02 NOTE — Telephone Encounter (Signed)
noted 

## 2016-04-03 LAB — URINE CULTURE

## 2016-04-06 ENCOUNTER — Other Ambulatory Visit: Payer: Self-pay

## 2016-04-06 NOTE — Patient Outreach (Signed)
Telephone assessment: New referral from health coach.  Reviewed EMR.  Placed call to patient who reports that she is taking her antibiotic for UTI.  Patient reports that she is managing her DM well.  When I inquired about CBG levels, patient reports 200-400.  Reports that she is taking her medication as prescribed. States that she knows what to eat. Reports that her sister is living with her an assisting as needed.   Patient is not monitoring CBG fasting.  It is 1pm today and patient has not checked CBG yet today but has already eaten for today.   Reviewed with patient the importance of monitoring CBG fasting. Encouraged patient to call MD office for follow up on CBG and UTI. Patient states she will call MD today.  Reviewed importance of taking all medications as prescribed.   PLAN: Will contact patient in 1 week and offer home visit. Tomasa Rand, RN, BSN, CEN [facility] [employer] [phone]

## 2016-04-14 ENCOUNTER — Ambulatory Visit: Payer: Medicare Other | Admitting: Nurse Practitioner

## 2016-04-14 ENCOUNTER — Other Ambulatory Visit: Payer: Self-pay

## 2016-04-14 NOTE — Patient Outreach (Signed)
Transition of care: Placed call to patient for follow up. No answer.   Plan: left a message requesting a return call. If no response will attempt again.  Tomasa Rand, RN, BSN, CEN [facility] [employer] [phone]

## 2016-04-21 ENCOUNTER — Telehealth: Payer: Self-pay

## 2016-04-21 ENCOUNTER — Other Ambulatory Visit: Payer: Self-pay

## 2016-04-21 NOTE — Telephone Encounter (Signed)
I received a call from Tomasa Rand, [facility] RN, about patient's CBG's being very high recently. Patient reported that her blood sugars have been ranging between 300-400. On 04/20/16 that blood sugar was 452 after breakfast and 363 at 8:30 PM. Patient blood sugar was 521 while pt was on the phone with Estill Bamberg today. Patient had pancakes with syrup for breakfast and had not taken medications yet.   Estill Bamberg would like to know if patient needs to be brought in for an OV before her next scheduled appointment on 5/23. Please advise.

## 2016-04-21 NOTE — Patient Outreach (Signed)
Care Coordination: Call back from [facility] with [facility] who states the office will call and make an appointment for patient to be seen.   PLAN: Home visit planned for later this week.  Tomasa Rand, RN, BSN, CEN [facility] [employer] [phone]

## 2016-04-21 NOTE — Patient Outreach (Signed)
Care Coordination: Placed call to MD office to report CBG reading and request office visit. Spoke with Coralyn Mark who will send providers a message and call me back.  PLAN: will await a call back.  Tomasa Rand, RN, BSN, CEN [facility] [employer] [phone]

## 2016-04-21 NOTE — Telephone Encounter (Signed)
Left a message for patient to call the office.

## 2016-04-21 NOTE — Patient Outreach (Signed)
Telephone call follow up: Placed call to patient to discuss concerns about DM.  Patient reports that her CBG range is 300-400.  CBG on 04/20/2016 after breakfast was 452.   8:30 pm was 363. CBG reading today while on the phone after breakfast was  521.Patient reports eating pancakes with syrup today. Coffee and water. Has not yet taken her insulin or other medications.   Discussed with patient that she should be monitoring CBG fasting every day. Patient reports that she is taking her insulin 14 units twice a day as prescribed.   PLAN: Encouraged patient to make an appointment with primary MD to discuss problems with CBG. Offered home visit for 04/24/2016 at 10 am and patient has accepted. Also will call MD and notify of high CBG and get patient and office visit scheduled.   Tomasa Rand, RN, BSN, CEN [facility] [employer] [phone]

## 2016-04-21 NOTE — Telephone Encounter (Signed)
Yes needs OV, please schedule for next available appt and have her bring her blood sugar readings and medications to visit

## 2016-04-23 ENCOUNTER — Other Ambulatory Visit: Payer: Self-pay

## 2016-04-23 NOTE — Telephone Encounter (Signed)
Patient called back and scheduled an appointment for Tuesday 04/28/16. Patient was asked to bring a record of her blood sugar readings.

## 2016-04-23 NOTE — Patient Outreach (Signed)
Care Coordination: Reviewed EMR and MD office trying to reach patient.  I placed call to patient and encouraged her to call MD office to scheduled office visit and she reports that she will.  Confirmed home visit for tomorrow at 11:30.  Time changed. Patient reports CBG today of 147 and 146  Yesterday.  Tomasa Rand, RN, BSN, CEN [facility] [employer] [phone]

## 2016-04-23 NOTE — Telephone Encounter (Signed)
I left a message for patient to call the office to discuss setting up an appointment.

## 2016-04-24 ENCOUNTER — Other Ambulatory Visit: Payer: Self-pay

## 2016-04-24 ENCOUNTER — Ambulatory Visit: Payer: Self-pay

## 2016-04-24 NOTE — Patient Outreach (Signed)
Care coordination: 4540 am  Placed call to patient to change time of today's home visit.  Patient agreed but states that she has plans.  Home visit time changed to 2pm.  11:55am  Patient called back and states that she will not be home at 2pm due to previous plans.  I apologized and offered home visit for next business day on 04/28/2106  Patient agreed. Reports fasting CBG of 297 today.   PLAN: will see patient on 04/27/2016.  Encouraged patient to take her medications as prescribed and follow her DM diet.  Tomasa Rand, RN, BSN, CEN [facility] [employer] [phone]

## 2016-04-26 ENCOUNTER — Other Ambulatory Visit: Payer: Self-pay | Admitting: Internal Medicine

## 2016-04-27 ENCOUNTER — Other Ambulatory Visit: Payer: Self-pay

## 2016-04-27 ENCOUNTER — Other Ambulatory Visit: Payer: Self-pay | Admitting: *Deleted

## 2016-04-27 MED ORDER — CHLORTHALIDONE 25 MG PO TABS: 12.5000 mg | ORAL_TABLET | Freq: Every day | ORAL | 1 refills | Status: DC
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
## 2016-04-27 NOTE — Patient Outreach (Signed)
Allison [name]) Care Management   04/27/2016  Allison [name] [birth year] [id number]  Allison [name] is an 82 y.o. female 6 am  Arrived for home visit. Son- Allison [name] and Sister present.  Subjective:  Patient reports that her blood sugars having been running high. Reports that she knows that she needs to eat better and exercise. Patient reports that she is struggling to take her medications as directed. States that she does not like to take medications. Patient reports that she has lost her prescriptions bottles right now and does not know where her medications are. States that she thinks she lost them in her room.  Patietn reports that she is going to the pharmacy today to get all her medications. Son states that help patient find her medications. Patient reports that she does not follow her diabetes diet and is not sure she wants to.  Reports that she lives with her sister and sometimes has issues with her sister. Patients son states that he or his wife will take over patients medications.   Objective:  Patient is awake and alert but forgetful.  Able to verbalize name, date of birth, year, month, date and president correctly. Unable to locate her medications. Ambulating without difficulty at this time. Patient sleeps on sofa.  Unable to visualize medications bottles as patient reports that she has lost her medications. Patient did have 4 bottles of cholesterol medications that she has not been taking.   Vitals:   04/27/16 1100  BP: (!) 142/80  Pulse: 80  Resp: 18  SpO2: 98%  Weight: 146 lb (66.2 kg)  Height: 1.626 m ('5\' 4"' )   Review of Systems  Constitutional: Negative.   HENT: Negative.   Eyes:       Wears glasses  Respiratory: Negative.   Cardiovascular: Negative.   Gastrointestinal: Negative.   Genitourinary: Positive for frequency. Negative for dysuria.  Musculoskeletal: Negative.   Skin:       Dry skin   Neurological:       Reports occasional dizziness   Psychiatric/Behavioral: Positive for depression and memory loss. The patient is nervous/anxious.        States she has lost her pills and cant find them.  Reports that she puts things somewhere and then can not find them.    Physical Exam  Constitutional: She is oriented to person, place, and time. She appears well-developed and well-nourished.  Cardiovascular: Normal rate, regular rhythm, normal heart sounds and intact distal pulses.   Respiratory: Effort normal and breath sounds normal.  GI: Soft. Bowel sounds are normal.  Musculoskeletal: Normal range of motion. She exhibits no edema.  Neurological: She is alert and oriented to person, place, and time.  Skin: Skin is warm and dry.  Psychiatric: She has a normal mood and affect. Her behavior is normal. Judgment and thought content normal.    Encounter Medications:   Outpatient Encounter Prescriptions as of 04/27/2016  Medication Sig  . atorvastatin (LIPITOR) 40 MG tablet take 1 tablet by mouth at bedtime  . blood glucose meter kit and supplies KIT Check blood glucose twice daily before insulin therapy (ICD-10 E11.21, E11.65).  . chlorthalidone (HYGROTON) 25 MG tablet take 1/2 tablet by mouth once daily  . clopidogrel (PLAVIX) 75 MG tablet Take 1 tablet (75 mg total) by mouth daily.  Marland Kitchen glucose blood (ONE TOUCH ULTRA TEST) test strip Use to test blood sugar three times daily. Dx: E11.21  . insulin aspart (NOVOLOG FLEXPEN) 100 UNIT/ML FlexPen Inject 14  Units into the skin 2 (two) times daily.  Marland Kitchen levothyroxine (SYNTHROID, LEVOTHROID) 150 MCG tablet take 1 tablet by mouth every morning ON AN EMPTY STOMACH for THROID  . metFORMIN (GLUCOPHAGE) 500 MG tablet take 2 tablets by mouth twice a day with food  . omeprazole (PRILOSEC) 20 MG capsule take 1 capsule by mouth once daily  . ONETOUCH DELICA LANCETS 45G MISC Use to test blood sugar three times daily. Dx: E11.21  . vitamin E (VITAMIN E) 1000 UNIT capsule Take 1,000 Units by mouth daily.  .  betamethasone dipropionate (DIPROLENE) 0.05 % cream Apply topically 2 (two) times daily.  . cephALEXin (KEFLEX) 500 MG capsule Take 1 capsule (500 mg total) by mouth 2 (two) times daily. (Patient not taking: Reported on 04/27/2016)  . citalopram (CELEXA) 10 MG tablet Take 1 tablet (10 mg total) by mouth daily. For anxiety (Patient not taking: Reported on 04/27/2016)  . gabapentin (NEURONTIN) 100 MG capsule Take 200 mg by mouth 3 (three) times daily as needed (nerve pain).   . meclizine (ANTIVERT) 25 MG tablet Take 1 tablet (25 mg total) by mouth 3 (three) times daily as needed for dizziness. (Patient not taking: Reported on 04/27/2016)   No facility-administered encounter medications on file as of 04/27/2016.     Functional Status:   In your present state of health, do you have any difficulty performing the following activities: 04/27/2016 02/12/2016  Hearing? Y N  Vision? N N  Difficulty concentrating or making decisions? [name]  Walking or climbing stairs? Y N  Dressing or bathing? N N  Doing errands, shopping? [name]  Preparing Food and eating ? N N  Using the Toilet? N N  In the past six months, have you accidently leaked urine? Y Y  Do you have problems with loss of bowel control? N N  Managing your Medications? Y N  Managing your Finances? N N  Housekeeping or managing your Housekeeping? N N  Some recent data might be hidden    Fall/Depression Screening:    PHQ 2/9 Scores 04/27/2016 01/23/2016 11/21/2015 10/08/2014 06/27/2014 12/05/2013 10/04/2013  PHQ - 2 Score 6 0 0 0 0 0 0  PHQ- 9 Score 12 - - - - - -   Fall Risk  04/27/2016 03/19/2016 02/18/2016 02/13/2016 01/23/2016  Falls in the past year? Yes Yes No No No  Number falls in past yr: 2 or more 1 - - -  Injury with Fall? No Yes - - -  Risk Factor Category  High Fall Risk High Fall Risk - - -  Risk for fall due to : - History of fall(s) Other (Comment) - -  Risk for fall due to (comments): - - Patient reports dizziness.  Went to ED on 02-15-16 for  same.  Education provided on fall prevention. - -  Follow up Falls prevention discussed Falls evaluation completed;Education provided;Falls prevention discussed - - -   Assessment:   (1) Reviewed [facility] program. Provided new patient packet and obtained a new consent.  (2) fall risk . Recent falls. (3) positive depression screening. (4) DM: is not following DM diet. Is not taking medications as prescribed.   CBG range 200-500.  Fasting today of 227.  (5) interested in food resources and alternative housing options.  (6) no advanced directives. (7) not taking her medications.  Plan:  (1) consent scanned into chart. (2) reviewed fall risk. Discussed importance of non slip socks and the use of assistive devices as needed. (3) will  send this note to MD. Patient is not taking her medications for depression. (4) Long discussion about DM management. Provided EMMI education about DM.  Reviewed importance of taking medications as prescribed. Reviewed importance of keep a log of CBG readings and when to call MD.  Reviewed pending appointment for tomorrow.  (5) placed referral to [facility] social worker. (6) provided advanced directive packet.  Reviewed with patient and son on how to complete. (7) referral placed for [employer]. Provided pill box.   Encouraged son to take over medications for atleast a 2 week period of time to see if patients CBG levels improve.   Care setting and care planning during home visit. Primary goal is to improve CBG readings.   THN CM Care Plan Problem One     Most Recent Value  Care Plan Problem One  Recent UTI and high CBG readings per patient report.  Role Documenting the Problem One  Care Management [place] for Problem One  Active  [name] (31-90 days)  Patient will report decreased fasting CBG readings to under 200 in the next 60 days.  [name] Start Date  04/06/16  Interventions for Problem One Long Term Goal  Long discussion with  patient about DM and daily management. Encouraged patient to take medications as prescribed.   THN CM Short Term Goal #1 (0-30 days)  Patient will verbalize daily monitoring of CBG fasting in the next 30 days.  THN CM Short Term Goal #1 Start Date  04/06/16  Interventions for Short Term Goal #1  Reviewed CBG readfings from CBG machine with patient and son.  Encouraged patient to take her medications as prescribed.  Provided pill box  THN CM Short Term Goal #2 (0-30 days)  Patient will report following up with MD in 1 week.  THN CM Short Term Goal #2 Start Date  04/06/16  Interventions for Short Term Goal #2  Reviewed pending MD appointment for tomorrow.   THN CM Short Term Goal #3 (0-30 days)  Patient will report  exercsing for 15 minutes twice a week for 14 days.   THN CM Short Term Goal #3 Start Date  04/27/16  Interventions for Short Tern Goal #3  Reviewed importance of daily exercise.   THN CM Short Term Goal #4 (0-30 days)  Son will be responsible for patient taking her medications as prescribed for the next 14 days.  THN CM Short Term Goal #4 Start Date  04/27/16  Interventions for Short Term Goal #4  Reviewed with son the importance of someone other than patient  being responsible for patients medications. He agreed to take this over at this time. Pharmacy referral placed.       Next follow up planned for 2 weeks via phone to assess progress towards goals.   Tomasa Rand, RN, BSN, CEN [facility] [employer] [phone]

## 2016-04-28 ENCOUNTER — Ambulatory Visit (INDEPENDENT_AMBULATORY_CARE_PROVIDER_SITE_OTHER): Payer: Medicare Other | Admitting: Nurse Practitioner

## 2016-04-28 ENCOUNTER — Encounter: Payer: Self-pay | Admitting: *Deleted

## 2016-04-28 ENCOUNTER — Encounter: Payer: Self-pay | Admitting: Nurse Practitioner

## 2016-04-28 VITALS — BP 122/78 | HR 83 | Temp 97.8°F | Resp 17 | Ht 64.0 in | Wt 145.0 lb

## 2016-04-28 DIAGNOSIS — E1165 Type 2 diabetes mellitus with hyperglycemia: Secondary | ICD-10-CM | POA: Diagnosis not present

## 2016-04-28 DIAGNOSIS — IMO0002 Reserved for concepts with insufficient information to code with codable children: Secondary | ICD-10-CM

## 2016-04-28 DIAGNOSIS — Z23 Encounter for immunization: Secondary | ICD-10-CM

## 2016-04-28 DIAGNOSIS — E1121 Type 2 diabetes mellitus with diabetic nephropathy: Secondary | ICD-10-CM | POA: Diagnosis not present

## 2016-04-28 DIAGNOSIS — I63019 Cerebral infarction due to thrombosis of unspecified vertebral artery: Secondary | ICD-10-CM | POA: Diagnosis not present

## 2016-04-28 DIAGNOSIS — K219 Gastro-esophageal reflux disease without esophagitis: Secondary | ICD-10-CM | POA: Diagnosis not present

## 2016-04-28 MED ORDER — INSULIN ASPART PROT & ASPART (70-30 MIX) 100 UNIT/ML ~~LOC~~ SUSP: 16.0000 [IU] | Freq: Two times a day (BID) | SUBCUTANEOUS | 11 refills | Status: DC

## 2016-04-28 MED ORDER — METFORMIN HCL 500 MG PO TABS: ORAL_TABLET | ORAL | 3 refills | Status: DC

## 2016-04-28 MED ORDER — PANTOPRAZOLE SODIUM 40 MG PO TBEC: 40.0000 mg | DELAYED_RELEASE_TABLET | Freq: Every day | ORAL | 3 refills | Status: DC

## 2016-04-28 MED ORDER — MECLIZINE HCL 25 MG PO TABS: 25.0000 mg | ORAL_TABLET | Freq: Three times a day (TID) | ORAL | 0 refills | Status: DC | PRN

## 2016-04-28 MED ORDER — GLUCOSE BLOOD VI STRP: ORAL_STRIP | 11 refills | Status: DC

## 2016-04-28 MED ORDER — INSULIN ASPART 100 UNIT/ML FLEXPEN: 16.0000 [IU] | PEN_INJECTOR | Freq: Two times a day (BID) | SUBCUTANEOUS | 6 refills | Status: DC

## 2016-04-28 NOTE — Progress Notes (Signed)
Careteam: Patient Care Team: Gildardo Cranker, DO as PCP - General (Internal Medicine) Thana Ates, RN as [place], LCSW as [place] Management  Advanced Directive information Does Patient Have a Medical Advance Directive?: No  Allergies  Allergen Reactions  . Lantus [Insulin Glargine] Itching and Swelling    angioedema  . Levemir [Insulin Detemir] Itching and Swelling    angioedema  . Pineapple Anaphylaxis  . Codeine Other (See Comments)    unknown  . Invokana [Canagliflozin] Other (See Comments)    Constipation, nightmares  . Penicillins Other (See Comments)    Unknown   . Tradjenta [Linagliptin] Other (See Comments)  . Vioxx [Rofecoxib] Other (See Comments)    unknown    Chief Complaint  Patient presents with  . Acute Visit    Pt is being seen due to elevated blood sugar levels x several months.      HPI: Patient is a 82 y.o. female seen in the office today due to medication concerns.  Spilled all the pill bottles out in her "junk" room and now she can not find them. Going to see if she can get them refilled.  Previously been seen by Dr Lucila Maine last visit in December after her CVA Taking 14 units of Novolog 70/30 twice daily with meals.  No hypoglycemic episodes. Did not bring her blood sugar log but knows all her blood sugars in the 200s-400s.  Reports she knows what to eat, has gone to class. Seeing Cathey but missed her last appt.  Eating out a lot, eating a lot of foods that "change sugar to sugar" Reports she is not missing any doses of insulin and taking metformin. Eating at least 3 meals a day and snacks.     MMSE - Mini Mental State Exam 04/28/2016 12/05/2013  Orientation to time 5 5  Orientation to Place 5 5  Registration 3 3  Attention/ Calculation 5 5  Recall 2 2  Language- name 2 objects 2 2  Language- repeat 1 1  Language- follow 3 step command 3 3  Language- read & follow  direction 1 1  Write a sentence 1 1  Copy design 1 1  Total score 29 29     Review of Systems:  Review of Systems  Constitutional: Negative for activity change, appetite change, fatigue and unexpected weight change.  HENT: Negative for congestion and hearing loss.   Eyes: Negative.   Respiratory: Negative for cough and shortness of breath.   Cardiovascular: Negative for chest pain, palpitations and leg swelling.  Gastrointestinal: Negative for abdominal pain, constipation and diarrhea.  Skin: Negative for color change and wound.  Neurological: Negative for dizziness and weakness.  Psychiatric/Behavioral: Negative for agitation, behavioral problems and confusion.    Past Medical History:  Diagnosis Date  . Carpal tunnel syndrome   . Diabetes mellitus without complication ([place])   . Dysphagia   . GERD (gastroesophageal reflux disease)   . Hiatal hernia   . HLD (hyperlipidemia)   . Hx of adenomatous colonic polyps 06/19/2014  . Hypertension   . Hypothyroidism   . Onychomycosis 03/05/2012  . Pain in lower limb 04/18/2013  . Sciatic nerve pain   . Vertigo    Past Surgical History:  Procedure Laterality Date  . BLADDER SURGERY     Mesh implant  . COLONOSCOPY    . LARYNX SURGERY     vocal cords  . PARTIAL HYSTERECTOMY  1978  .  VESICOVAGINAL FISTULA CLOSURE W/ TAH  1976   Social History:   reports that she has never smoked. She has never used smokeless tobacco. She reports that she does not drink alcohol or use drugs.  Family History  Problem Relation Age of Onset  . Alzheimer's disease Mother   . Glaucoma Mother   . Heart disease Father   . Alzheimer's disease Father   . Asthma Sister   . Allergies Sister   . Allergies Sister   . Sleep apnea Sister   . Heart disease Sister   . Breast cancer Maternal Aunt   . Pancreatic cancer Maternal Aunt   . Prostate cancer Maternal Uncle   . Prostate cancer Paternal Uncle   . Alcoholism Maternal Uncle   . Kidney disease Maternal  Aunt   . Heart disease Maternal Aunt     Medications: Patient's Medications  New Prescriptions   No medications on file  Previous Medications   ATORVASTATIN (LIPITOR) 40 MG TABLET    take 1 tablet by mouth at bedtime   BETAMETHASONE DIPROPIONATE (DIPROLENE) 0.05 % CREAM    Apply topically 2 (two) times daily.   BLOOD GLUCOSE METER KIT AND SUPPLIES KIT    Check blood glucose twice daily before insulin therapy (ICD-10 E11.21, E11.65).   CHLORTHALIDONE (HYGROTON) 25 MG TABLET    Take 0.5 tablets (12.5 mg total) by mouth daily.   CITALOPRAM (CELEXA) 10 MG TABLET    Take 1 tablet (10 mg total) by mouth daily. For anxiety   CLOPIDOGREL (PLAVIX) 75 MG TABLET    Take 1 tablet (75 mg total) by mouth daily.   GABAPENTIN (NEURONTIN) 100 MG CAPSULE    Take 200 mg by mouth 3 (three) times daily as needed (nerve pain).    GLUCOSE BLOOD (ONE TOUCH ULTRA TEST) TEST STRIP    Use to test blood sugar three times daily. Dx: E11.21   INSULIN ASPART (NOVOLOG FLEXPEN) 100 UNIT/ML FLEXPEN    Inject 14 Units into the skin 2 (two) times daily.   INSULIN PEN NEEDLE (FIFTY50 PEN NEEDLES) 32G X 4 MM MISC    1 each by Misc.(Non-Drug; Combo Route) route.   LEVOTHYROXINE (SYNTHROID, LEVOTHROID) 150 MCG TABLET    take 1 tablet by mouth every morning ON AN EMPTY STOMACH for THROID   MECLIZINE (ANTIVERT) 25 MG TABLET    Take 1 tablet (25 mg total) by mouth 3 (three) times daily as needed for dizziness.   METFORMIN (GLUCOPHAGE) 500 MG TABLET    take 2 tablets by mouth twice a day with food   OMEPRAZOLE (PRILOSEC) 20 MG CAPSULE    take 1 capsule by mouth once daily   ONETOUCH DELICA LANCETS 97D MISC    Use to test blood sugar three times daily. Dx: E11.21   VITAMIN E (VITAMIN E) 1000 UNIT CAPSULE    Take 1,000 Units by mouth daily.  Modified Medications   No medications on file  Discontinued Medications   CEPHALEXIN (KEFLEX) 500 MG CAPSULE    Take 1 capsule (500 mg total) by mouth 2 (two) times daily.     Physical  Exam:  Vitals:   04/28/16 1407  BP: 122/78  Pulse: 83  Resp: 17  Temp: 97.8 F (36.6 C)  TempSrc: Oral  SpO2: 98%  Weight: 145 lb (65.8 kg)  Height: 5' 4" (1.626 m)   Body mass index is 24.89 kg/m.  Physical Exam  Constitutional: She is oriented to person, place, and time. She appears well-developed and  well-nourished. No distress.  HENT:  Mouth/Throat: Oropharynx is clear and moist. No oropharyngeal exudate.  Eyes: Pupils are equal, round, and reactive to light.  Neck: Neck supple. Carotid bruit is not present.  Cardiovascular: Normal rate and regular rhythm.  Exam reveals no gallop and no friction rub.   Murmur (1/6 SEM) heard. Pulmonary/Chest: Effort normal and breath sounds normal.  Abdominal: Soft. Bowel sounds are normal. She exhibits no distension. There is no hepatomegaly.  Musculoskeletal: She exhibits no edema.  Neurological: She is alert and oriented to person, place, and time.  04/28/16 MMSE 29/30  Skin: Skin is warm and dry.  Psychiatric: She has a normal mood and affect.    Labs reviewed: Basic Metabolic Panel:  Recent Labs  05/22/15 0845  09/03/15 0845  10/28/15 0721  02/13/16 1425 02/15/16 0417 02/15/16 0427 04/02/16 1043  NA 139  --  138  < >  --   < > 143 142 143 139  K 4.3  --  4.1  < >  --   < > 3.6 3.6 3.4* 3.2*  CL 96  --  100  < >  --   < > 105 106 105 101  CO2 27  --  28  < >  --   < > 29 27  --  28  GLUCOSE 231*  --  216*  < >  --   < > 94 222* 212* 180*  BUN 10  --  12  < >  --   < > 7 5* 5* 8  CREATININE 0.79  < > 0.80  < >  --   < > 0.92 0.78 0.80 0.78  CALCIUM 9.7  --  9.5  < >  --   < > 9.3 9.2  --  9.7  TSH 3.470  --  0.35*  --  1.885  --   --   --   --   --   < > = values in this interval not displayed. Liver Function Tests:  Recent Labs  05/22/15 0845 09/03/15 0845 02/13/16 1425 02/15/16 0417  AST 11  --  11 17  ALT _0 11*  ALKPHOS 83  --  49 47  BILITOT 0.5  --  0.5 0.7  PROT 7.0  --  6.6 6.1*  ALBUMIN 4.0  --   3.7 3.4*   No results for input(s): LIPASE, AMYLASE in the last 8760 hours. No results for input(s): AMMONIA in the last 8760 hours. CBC:  Recent Labs  10/26/15 2157  01/28/16 1414 02/15/16 0417 02/15/16 0427 04/02/16 1043  WBC 7.7  < > 7.6 7.0  --  7.5  NEUTROABS 4.2  --   --  3.9  --  4.3  HGB 13.9  < > 12.9 11.7* 11.2* 13.5  HCT 40.8  < > 37.2 34.0* 33.0* 40.3  MCV 80.2  < > 79.0 80.2  --  80.4  PLT 248  < > 218 246  --  236  < > = values in this interval not displayed. Lipid Panel:  Recent Labs  09/03/15 0845 10/29/15 0352 02/13/16 1425  CHOL 266* 290* 210*  HDL 45* 41 44*  LDLCALC 178* 213* 130*  TRIG 217* 180* 182*  CHOLHDL 5.9* 7.1 4.8   TSH:  Recent Labs  05/22/15 0845 09/03/15 0845 10/28/15 0721  TSH 3.470 0.35* 1.885   A1C: Lab Results  Component Value Date   HGBA1C 8.0 (H) 02/13/2016  Assessment/Plan 1. Uncontrolled type 2 diabetes mellitus with diabetic nephropathy, without long-term current use of insulin (HCC) -uncontrolled, did not bring blood sugar log but blood sugars are elevated in high 100s-400s. No low blood sugars noted.  -will increase novolog mix to 16 units twice daily and to bring log to follow up visit with Cathey -encouraged dietary modifications. To eat healthy choices.  - glucose blood (ONE TOUCH ULTRA TEST) test strip; Use to test blood sugar three times daily. Dx: E11.21  Dispense: 100 each; Refill: 11 - metFORMIN (GLUCOPHAGE) 500 MG tablet; take 2 tablets by mouth twice a day with food  Dispense: 180 tablet; Refill: 3 - Ambulatory referral to [facility] - insulin aspart protamine- aspart (NOVOLOG MIX 70/30) (70-30) 100 UNIT/ML injection; Inject 0.16 mLs (16 Units total) into the skin 2 (two) times daily with a meal.  Dispense: 10 mL; Refill: 11  2. Gastroesophageal reflux disease without esophagitis -To stop omeprazole (pt on plavix after CVA) due to interaction will change to protonix   - pantoprazole (PROTONIX) 40  MG tablet; Take 1 tablet (40 mg total) by mouth daily.  Dispense: 30 tablet; Refill: 3  Follow up with Tivis Ringer Pharm D in 1-2 weeks  Carlos American. Harle Battiest  [facility] [phone] 8 am - 5 pm) [phone] (after hours)

## 2016-04-28 NOTE — Patient Instructions (Addendum)
Increase novolog 70/30 to 16 units into skin twice daily  Follow up with [facility] Monday  Keep blood sugar log up to date- check blood sugars before breakfast and before bed Bring log to visit with cathey  STOP omeprazole Start Protonix 40 mg daily   Use pill box

## 2016-04-30 ENCOUNTER — Telehealth: Payer: Self-pay | Admitting: Pharmacist

## 2016-04-30 ENCOUNTER — Other Ambulatory Visit: Payer: Self-pay

## 2016-04-30 NOTE — Patient Outreach (Signed)
Care Coordination: Voicemail received from patients son Tyiesha Brackney requesting a return call.  Called son back. No answer. Left a message for son to return my call.  PLAN: will continue to follow up with patient.  Tomasa Rand, RN, BSN, CEN [facility] [employer] [phone]

## 2016-04-30 NOTE — Patient Outreach (Signed)
Transition of care call: Placed call to patient who reports that her sons are at a disagreement of who should assist patient with her medications.  Patient reports that she was not able to find her medications and that she is going to the pharmacy today to get her medications. Reports that she is out of strips as well and as not been able to check CBG for today.    Reviewed with patient referral the [facility] pharmacy.  Patient states that she has it " under control.  Incoming call from Remi Haggard ( on consent) who states that he is going to assist patient with her medications.  Reviewed with son the importance of taking all medications as prescribed.  Referral pending referral to [facility] [place].   Plan: Will continue to follow up with patient as planned.  Tomasa Rand, RN, BSN, CEN [facility] [employer] [phone]

## 2016-04-30 NOTE — Patient Outreach (Signed)
[facility]) Care Management  04/30/2016  Allison [name] [birth year] [id number]   Patient was called regarding medication adherence per referral received from [place], Tomasa Rand.  Unfortunately, contact could not be made with the patient. Voicemails were left on the patient's telephone and her son Allison [name]'s phone. Her other son Allison [name], requested he be called but the number in the chart for him was busy multiple times and seemed as though the call did not go through.  Plan:  Patient will be called back within 1-3 business days.  Elayne Guerin, PharmD, [place] Clinical Pharmacist [phone]

## 2016-05-01 ENCOUNTER — Other Ambulatory Visit: Payer: Self-pay | Admitting: Pharmacist

## 2016-05-02 NOTE — Patient Outreach (Signed)
[facility]) Care Management  05/02/2016  EDELIN FRYER 21-Apr-1934 [id number]  Patient was called regarding medication adherence per referral from [place], Tomasa Rand.  No answer. HIPAA compliant message left on patient's voicemail.   Plan:  Call patient back within 1-3 business days.  Elayne Guerin, PharmD, [place] Clinical Pharmacist [phone]

## 2016-05-04 ENCOUNTER — Ambulatory Visit: Payer: Medicare Other | Admitting: Pharmacotherapy

## 2016-05-06 ENCOUNTER — Other Ambulatory Visit: Payer: Self-pay | Admitting: Pharmacist

## 2016-05-07 ENCOUNTER — Encounter: Payer: Self-pay | Admitting: Pharmacist

## 2016-05-07 ENCOUNTER — Other Ambulatory Visit: Payer: Self-pay | Admitting: *Deleted

## 2016-05-07 NOTE — Patient Outreach (Signed)
Allison [name]) Care Management  Allison [name]   05/07/2016  Allison [name] 10-Oct-1934 [id number]  Subjective: Patient was called regarding medication adherence per referral from Allison [name], Allison [name].  HIPAA identifiers were obtained.  Patient is a 82 year old female with multiple medical conditions including but not limited to:  PVD, history of stroke, type 2 diabetes with secondary neuropathy, hyperlipidemia, hypothyroidism, and GERD. Hospitalized 10/17-CVA, ED 3/28- UTI   Patient reported managing her medications herself. She said she uses a pill box but does not always get it filled.  Patient reported her son sometimes helps her but has not in the past few weeks.  She did not report any difficulty affording her medications.  Objective:    Encounter Medications: Outpatient Encounter Prescriptions as of 05/06/2016  Medication Sig Note  . atorvastatin (LIPITOR) 40 MG tablet take 1 tablet by mouth at bedtime   . blood glucose meter kit and supplies KIT Check blood glucose twice daily before insulin therapy (ICD-10 E11.21, E11.65).   . chlorthalidone (HYGROTON) 25 MG tablet Take 0.5 tablets (12.5 mg total) by mouth daily.   . citalopram (CELEXA) 10 MG tablet Take 1 tablet (10 mg total) by mouth daily. For anxiety   . clopidogrel (PLAVIX) 75 MG tablet Take 1 tablet (75 mg total) by mouth daily.   Marland Kitchen gabapentin (NEURONTIN) 100 MG capsule Take 200 mg by mouth 3 (three) times daily as needed (nerve pain).  05/06/2016: Does not take daily--but would like a new prescription because her hands hurt  . glucose blood (ONE TOUCH ULTRA TEST) test strip Use to test blood sugar three times daily. Dx: E11.21   . insulin aspart protamine- aspart (NOVOLOG MIX 70/30) (70-30) 100 UNIT/ML injection Inject 0.16 mLs (16 Units total) into the skin 2 (two) times daily with a meal.   . Insulin Pen Needle (FIFTY50 PEN NEEDLES) 32G X 4 MM MISC 1 each by Misc.(Non-Drug; Combo Route)  route.   Marland Kitchen levothyroxine (SYNTHROID, LEVOTHROID) 150 MCG tablet take 1 tablet by mouth every morning ON AN EMPTY STOMACH for THROID   . meclizine (ANTIVERT) 25 MG tablet Take 1 tablet (25 mg total) by mouth 3 (three) times daily as needed for dizziness.   . metFORMIN (GLUCOPHAGE) 500 MG tablet take 2 tablets by mouth twice a day with food   . ONETOUCH DELICA LANCETS 31V MISC Use to test blood sugar three times daily. Dx: E11.21   . pantoprazole (PROTONIX) 40 MG tablet Take 1 tablet (40 mg total) by mouth daily.   . vitamin E (VITAMIN E) 1000 UNIT capsule Take 1,000 Units by mouth daily. 05/06/2016: Take a couple of times per week  . betamethasone dipropionate (DIPROLENE) 0.05 % cream Apply topically 2 (two) times daily.    No facility-administered encounter medications on file as of 05/06/2016.     Functional Status: In your present state of health, do you have any difficulty performing the following activities: 04/27/2016 02/12/2016  Hearing? Y N  Vision? N N  Difficulty concentrating or making decisions? [name]  Walking or climbing stairs? Y N  Dressing or bathing? N N  Doing errands, shopping? [name]  Preparing Food and eating ? N N  Using the Toilet? N N  In the past six months, have you accidently leaked urine? Y Y  Do you have problems with loss of bowel control? N N  Managing your Medications? Y N  Managing your Finances? N N  Housekeeping or managing  your Housekeeping? N N  Some recent data might be hidden    Fall/Depression Screening: Fall Risk  04/28/2016 04/27/2016 03/19/2016  Falls in the past year? No Yes Yes  Number falls in past yr: - 2 or more 1  Injury with Fall? - No Yes  Risk Factor Category  - High Fall Risk High Fall Risk  Risk for fall due to : - - History of fall(s)  Risk for fall due to (comments): - - -  Follow up - Falls prevention discussed Falls evaluation completed;Education provided;Falls prevention discussed   PHQ 2/9 Scores 04/27/2016 01/23/2016 11/21/2015  10/08/2014 06/27/2014 12/05/2013 10/04/2013  PHQ - 2 Score 6 0 0 0 0 0 0  PHQ- 9 Score 12 - - - - - -      Assessment: Patient's medications were reviewed over the phone.   Drugs sorted by system:  Neurologic/Psychologic: Citalopram Gabapentin  Cardiovascular: Atorvastatin Clopidogrel Chlorthalidone Amlodipine--discontinued at 10/17 discharge but patient reported still taking Losartan-discontinued at 10/17 discharge but patient reported still taking  Pulmonary/Allergy:  Gastrointestinal: Pantoprazole  Endocrine: Novolog Mix 70/30 Metformin   Vitamins/Minerals: Vitamin E  Topical Betamethasone Dipropionate cream  Medication Review Findings:  Losartan 167m and Amlodipine 112mwere both discontinued 11/01/2015 at discharge. Patient reported still taking both medications.   Plan:  1.  Route note to PCP  2.  Complete a pharmacy home visit to educate the patient about her medications and assist with medication adherence techniques.( Visit is scheduled for 05/12/16).  Allison GuerinPharmD, BCBarbertonlinical Pharmacist ([phone]

## 2016-05-07 NOTE — Patient Outreach (Signed)
[facility]) Care Management  05/07/2016  KEYLANI PERLSTEIN Jun 03, 1934 [id number]   CSW made an initial attempt to try and contact patient today to perform phone assessment, as well as assess and assist with social needs and services, without success.  CSW was unable to leave a HIPAA compliant message for patient on voicemail, as patient's phone just continued to ring.  CSW will make a second outreach attempt within the next week.   Nat Christen, BSW, MSW, LCSW  Licensed Education officer, [facility]  Mailing [place] [address], [place], [place] [address] Physical Address-[address], [place], [place] [address] Toll Free Main # [phone] Fax # [phone] Cell # [phone]  Office # [phone] [email or web address]

## 2016-05-11 ENCOUNTER — Encounter: Payer: Self-pay | Admitting: Pharmacotherapy

## 2016-05-11 ENCOUNTER — Other Ambulatory Visit: Payer: Self-pay

## 2016-05-11 ENCOUNTER — Ambulatory Visit (INDEPENDENT_AMBULATORY_CARE_PROVIDER_SITE_OTHER): Payer: Medicare Other | Admitting: Pharmacotherapy

## 2016-05-11 VITALS — BP 140/80 | HR 75 | Temp 97.9°F | Wt 147.5 lb

## 2016-05-11 DIAGNOSIS — I63019 Cerebral infarction due to thrombosis of unspecified vertebral artery: Secondary | ICD-10-CM

## 2016-05-11 DIAGNOSIS — IMO0002 Reserved for concepts with insufficient information to code with codable children: Secondary | ICD-10-CM

## 2016-05-11 DIAGNOSIS — E1165 Type 2 diabetes mellitus with hyperglycemia: Secondary | ICD-10-CM

## 2016-05-11 DIAGNOSIS — E1121 Type 2 diabetes mellitus with diabetic nephropathy: Secondary | ICD-10-CM | POA: Diagnosis not present

## 2016-05-11 DIAGNOSIS — I10 Essential (primary) hypertension: Secondary | ICD-10-CM

## 2016-05-11 MED ORDER — INSULIN ASPART PROT & ASPART (70-30 MIX) 100 UNIT/ML ~~LOC~~ SUSP: 20.0000 [IU] | Freq: Two times a day (BID) | SUBCUTANEOUS | 11 refills | Status: DC

## 2016-05-11 MED ORDER — GABAPENTIN 100 MG PO CAPS: 200.0000 mg | ORAL_CAPSULE | Freq: Three times a day (TID) | ORAL | 3 refills | Status: DC | PRN

## 2016-05-11 NOTE — Patient Outreach (Signed)
Telephone follow up: Placed call to patient for follow up. No answer.   Reviewed medical record and noted patient saw primary MD today.   PLAN: [facility] pharmacist to inform.  Will call patient back again.  Tomasa Rand, RN, BSN, CEN [facility] [employer] [phone]

## 2016-05-11 NOTE — Progress Notes (Signed)
  Subjective:    Allison [name] is a 82 y.o.African American female who presents for follow-up of Type 2 diabetes mellitus.   Has missed several appointments with me. Brought two RX vials that are not on current RX list, but cannot confirm or deny she is taking them. Her atorvastatin vial is empty - has not been taking in a while. Says a nurse comes out twice a month.  Last A1C: 8.0% (02/13/16) She is taking 16 units of 70/30 twice daily. No hypoglycemia. Did not bring meter or logbook.  She says BG 200's  She is concerned because she is not driving. No exercise.  Plans to use Silver Sneakers. Trying to make healthy food choices.  She does eat out a lot at [employer]. Denies problems with vision. No peripheral edema Denies problems with feet. Nocturia each night. 1-2 times each night. No dysuria Stopped drinking Pepsi.  Staying well hydrated with water. Has dry skin.   Review of Systems A comprehensive review of systems was negative except for: Eyes: positive for contacts/glasses Genitourinary: positive for nocturia Integument/breast: positive for dryness Behavioral/Psych: positive for easily forgetful    Objective:    BP 140/80   Pulse 75   Temp 97.9 F (36.6 C) (Oral)   Wt 147 lb 8 oz (66.9 kg)   SpO2 98%   BMI 25.32 kg/m   General:  alert, cooperative and no distress  Oropharynx: normal findings: lips normal without lesions and gums healthy   Eyes:  negative findings: lids and lashes normal and conjunctivae and sclerae normal   Ears:  external ears normal        Lung: clear to auscultation bilaterally  Heart:  regular rate and rhythm     Extremities: extremities normal, atraumatic, no cyanosis or edema  Skin: dry     Neuro: mental status, speech normal, alert and oriented x3 and gait and station normal   Lab Review Glucose, Bld (mg/dL)  Date Value  04/02/2016 180 (H)  02/15/2016 212 (H)  02/15/2016 222 (H)   CO2 (mmol/L)  Date Value  04/02/2016 28   02/15/2016 27  02/13/2016 29   BUN (mg/dL)  Date Value  04/02/2016 8  02/15/2016 5 (L)  02/15/2016 5 (L)  05/22/2015 10  02/20/2015 12  10/19/2014 11   Creat (mg/dL)  Date Value  02/13/2016 0.92  09/03/2015 0.80   Creatinine, Ser (mg/dL)  Date Value  04/02/2016 0.78  02/15/2016 0.80  02/15/2016 0.78       Assessment:    Diabetes Mellitus type II, under fair control.   BP above goal <130/80   Plan:    1.  Rx changes: restart Losartan 131m daily.  Increase insulin to 20 units twice daily.  2.  Will ask [facility] to assist with her medications to assure she is taking them as prescribed. 3.  Counseled on complications of uncontrolled DM and HTN. 4.  Counseled on nutrition goals. 5.  Exercise goal is 30-45 minutes 5 x week.

## 2016-05-11 NOTE — Patient Instructions (Signed)
Increase insulin to 20 units twice daily with meals  Restart Losartan 100mg  daily  Get atorvastatin refilled   Try Eucerin Cream for skin.

## 2016-05-12 ENCOUNTER — Other Ambulatory Visit: Payer: Self-pay | Admitting: Pharmacist

## 2016-05-12 ENCOUNTER — Other Ambulatory Visit: Payer: Self-pay

## 2016-05-12 NOTE — Patient Outreach (Signed)
Telephone assessment:  05/11/3016 Patient returned call and requested call back. Returned call. Reminded patient of pending pharmacy visit tomorrow on 5/1.  Patient did not remember.  Reviewed importance of pharmacy visit and patient taking all medications as prescribed.   New referral received from MD office, however patient is already active.   PLAN:  Reminded patient of pending pharmacy visit.  Placed call to pharmacist to inform.     05/12/2016 Incoming call from patient who left a voicemail and requested pharmacy visit be rescheduled.  Message sent to [facility] pharmacist to call patient.    Will continue to follow.   Tomasa Rand, RN, BSN, CEN [facility] [employer] [phone]

## 2016-05-12 NOTE — Patient Outreach (Signed)
[facility]) Care Management  [place]   05/12/2016  Allison [name] 06-08-1934 [id number]  Subjective: Home visit completed with patient in her home. HIPAA identifiers were obtained.  Patient was referred for [place] involvement by her physician, Gildardo Cranker for medication adherence.  Patient is a 82 year old female with multiple medical conditions including but not limited to:  PVD, history of stroke, type 2 diabetes with secondary neuropathy, hyperlipidemia, hypothyroidism, and GERD. Hospitalized 10/17-CVA, ED 3/28- UTI.    Patient said she manages her medications by herself. One of her sons [name]), used to help her but he doesn't any more.    Patient had a pill box that had several medications mixed and the entire bottom of the box would not stay on and allowed medications to fall out. (Patient was given a new pill box)    Objective:   Encounter Medications: Outpatient Encounter Prescriptions as of 05/12/2016  Medication Sig Note  . atorvastatin (LIPITOR) 40 MG tablet take 1 tablet by mouth at bedtime   . betamethasone dipropionate (DIPROLENE) 0.05 % cream Apply topically 2 (two) times daily.   . blood glucose meter kit and supplies KIT Check blood glucose twice daily before insulin therapy (ICD-10 E11.21, E11.65).   . chlorthalidone (HYGROTON) 25 MG tablet Take 0.5 tablets (12.5 mg total) by mouth daily.   . citalopram (CELEXA) 10 MG tablet Take 1 tablet (10 mg total) by mouth daily. For anxiety   . clopidogrel (PLAVIX) 75 MG tablet Take 1 tablet (75 mg total) by mouth daily.   Marland Kitchen gabapentin (NEURONTIN) 100 MG capsule Take 2 capsules (200 mg total) by mouth 3 (three) times daily as needed (nerve pain).   Marland Kitchen glucose blood (ONE TOUCH ULTRA TEST) test strip Use to test blood sugar three times daily. Dx: E11.21   . insulin aspart protamine- aspart (NOVOLOG MIX 70/30) (70-30) 100 UNIT/ML injection Inject 0.2 mLs (20 Units total) into the skin 2 (two) times  daily with a meal.   . Insulin Pen Needle (FIFTY50 PEN NEEDLES) 32G X 4 MM MISC 1 each by Misc.(Non-Drug; Combo Route) route.   Marland Kitchen levothyroxine (SYNTHROID, LEVOTHROID) 150 MCG tablet take 1 tablet by mouth every morning ON AN EMPTY STOMACH for THROID   . losartan (COZAAR) 100 MG tablet Take 100 mg by mouth daily.   . meclizine (ANTIVERT) 25 MG tablet Take 1 tablet (25 mg total) by mouth 3 (three) times daily as needed for dizziness.   . metFORMIN (GLUCOPHAGE) 500 MG tablet take 2 tablets by mouth twice a day with food   . ONETOUCH DELICA LANCETS 38U MISC Use to test blood sugar three times daily. Dx: E11.21   . pantoprazole (PROTONIX) 40 MG tablet Take 1 tablet (40 mg total) by mouth daily.   . vitamin E (VITAMIN E) 1000 UNIT capsule Take 1,000 Units by mouth daily. 05/06/2016: Take a couple of times per week   No facility-administered encounter medications on file as of 05/12/2016.     Functional Status: In your present state of health, do you have any difficulty performing the following activities: 04/27/2016 02/12/2016  Hearing? Y N  Vision? N N  Difficulty concentrating or making decisions? [name]  Walking or climbing stairs? Y N  Dressing or bathing? N N  Doing errands, shopping? [name]  Preparing Food and eating ? N N  Using the Toilet? N N  In the past six months, have you accidently leaked urine? [name]  Do you have problems with loss of bowel control? N N  Managing your Medications? Y N  Managing your Finances? N N  Housekeeping or managing your Housekeeping? N N  Some recent data might be hidden    Fall/Depression Screening: Fall Risk  04/28/2016 04/27/2016 03/19/2016  Falls in the past year? No Yes Yes  Number falls in past yr: - 2 or more 1  Injury with Fall? - No Yes  Risk Factor Category  - High Fall Risk High Fall Risk  Risk for fall due to : - - History of fall(s)  Risk for fall due to (comments): - - -  Follow up - Falls prevention discussed Falls evaluation completed;Education  provided;Falls prevention discussed   PHQ 2/9 Scores 04/27/2016 01/23/2016 11/21/2015 10/08/2014 06/27/2014 12/05/2013 10/04/2013  PHQ - 2 Score 6 0 0 0 0 0 0  PHQ- 9 Score 12 - - - - - -      Assessment: Patients medications were reviewed with her actual medication bottles in the home.  Neurologic/Psychologic: Citalopram Gabapentin  Cardiovascular: Atorvastatin Clopidogrel Chlorthalidone Losartan  Gastrointestinal: Pantoprazole  Endocrine: Novolog Mix 70/30 Metformin   Vitamins/Minerals: Vitamin E  Topical Betamethasone Dipropionate cream  Medication Review Findings:  Adherenece: 1.  Patient had multiple bottles of metformin and levothyroxine.  2.  Atorvastatin and clopidogrel were mixed in the same bottle.   Plan/Interventions:  1.  Patient was given a new pill box and it was filled for the next 7 days.  2.  A personalized medication list (organized by time and with printed indications) was created for the patient.    3.  Pill packaging was discussed.  Various pharmacies that provide pill packaging were reviewed with the patient. The patient decided to use [facility].  4.  A note will be sent to the patient's provider to send all active prescriptions to [facility] to be filled and put in pill packs.   5.  Patient's sister will take the patient to [facility] to provide a copy of her license. [place] was provided the patient's Medicare Part D information ([employer])  6.  Follow up with the patient in 3-5 business days.    Elayne Guerin, PharmD, [place] Clinical Pharmacist [phone]

## 2016-05-13 NOTE — Progress Notes (Signed)
This encounter was created in error - please disregard.

## 2016-05-14 ENCOUNTER — Other Ambulatory Visit: Payer: Self-pay | Admitting: *Deleted

## 2016-05-14 ENCOUNTER — Encounter: Payer: Self-pay | Admitting: *Deleted

## 2016-05-14 NOTE — Patient Outreach (Signed)
[facility]) Care Management  05/14/2016  Allison [name] [birth year] [id number]   CSW was able to make initial contact with patient today to perform phone assessment, as well as assess and assist with social work needs and services.  CSW introduced self, explained role and types of services provided through [place] [facility] ([place] [facility]).  CSW further explained to patient that CSW works with patient's RNCM, also with [place] [facility], Tomasa Rand. CSW then explained the reason for the call, indicating that Mrs. Allison [name] thought that patient would benefit from social work services and resources to assist with obtaining alternative housing arrangements, as well as a list of food resources.  CSW obtained two HIPAA compliant identifiers from patient, which included patient's name and date of birth. At first patient reported, "Well, I'm doing okay right now, don't believe I need anything".  However, CSW spoke with patient about various food resources available in [facility] (I.e. [employer], [employer], [employer], Social research officer, government.), when patient expressed an interest.  CSW agreed to mail patient [facility] [place] in [place] in [place].  Patient was not interested in [place] making a referral for her to [employer] through [employer] of [place].  Nor was patient interested in [place] her with completing an application for Food Stamps through the [place]. With regards to housing, patient was agreeable to [place] mailing her options regarding [employer]; however, patient indicated that she is not planning to move out from her daughter's home, anytime soon.  Patient admitted that it would be nice to know what resources are available to her, in the event that she decides to eventually get her own place.  CSW will include these resources in  the packet of information being mailed to patient's home.  No additional social work needs identified at this time.  CSW will follow-up with patient in one week to ensure that she received the packet of resource information mailed, as well as to answer any questions she may have and assist with the referral process. Nat Christen, BSW, MSW, LCSW  Licensed Education officer, environmental Health System  Mailing [place] [address], [place], [place] [address] Physical Address-[address], [place], [place] [address] Toll Free Main # [phone] Fax # [phone] Cell # [phone]  Office # [phone] [email or web address]

## 2016-05-14 NOTE — Patient Outreach (Signed)
[facility]) Care Management  05/14/2016  RACHELE LAMASTER October 04, 1934 [id number]   Request received from Nat Christen to mail patient personal care resources.  Information mailed today.

## 2016-05-15 ENCOUNTER — Other Ambulatory Visit: Payer: Self-pay | Admitting: Pharmacist

## 2016-05-15 NOTE — Patient Outreach (Signed)
[facility]) Care Management  05/15/2016  Allison [name] 27-Aug-1934 [id number]  Patient was called to follow up on the pill pack referral to [facility]. Unfortunately, she did not answer the phone. HIPAA compliant message was left on the patient's voicemail.    [place] was called to see if they received any of the requested information for the patient.  Pharmacy technician confirmed the following prescriptions are scheduled to be put in the pill packing system on 05/18/16:   Metformin Clopidogrel Losartan Metoprolol Gabapentin Novolog Levothyroxine Amlodipine  Several of the patient's medications are not eligible for refill as she just had them filled. It will most likely take a couple of cycles before all of her medications can go in the pill packs.     [facility] will deliver to the patient's home.  Plan:  1.  Follow up with the patient on Tuesday to see if she received the pill packs.  2.  Check with pharmacy again about the possibility of "synching" the medications that were too soon.  Elayne Guerin, PharmD, [place] Clinical Pharmacist [phone]

## 2016-05-18 ENCOUNTER — Other Ambulatory Visit: Payer: Self-pay

## 2016-05-18 DIAGNOSIS — E1165 Type 2 diabetes mellitus with hyperglycemia: Secondary | ICD-10-CM

## 2016-05-18 DIAGNOSIS — IMO0002 Reserved for concepts with insufficient information to code with codable children: Secondary | ICD-10-CM

## 2016-05-18 DIAGNOSIS — E034 Atrophy of thyroid (acquired): Secondary | ICD-10-CM

## 2016-05-18 DIAGNOSIS — E1121 Type 2 diabetes mellitus with diabetic nephropathy: Secondary | ICD-10-CM

## 2016-05-18 DIAGNOSIS — K219 Gastro-esophageal reflux disease without esophagitis: Secondary | ICD-10-CM

## 2016-05-18 MED ORDER — METFORMIN HCL 500 MG PO TABS: ORAL_TABLET | ORAL | 3 refills | Status: DC

## 2016-05-18 MED ORDER — CHLORTHALIDONE 25 MG PO TABS: 12.5000 mg | ORAL_TABLET | Freq: Every day | ORAL | 3 refills | Status: DC

## 2016-05-18 MED ORDER — PANTOPRAZOLE SODIUM 40 MG PO TBEC: 40.0000 mg | DELAYED_RELEASE_TABLET | Freq: Every day | ORAL | 3 refills | Status: DC

## 2016-05-18 MED ORDER — LOSARTAN POTASSIUM 100 MG PO TABS: 100.0000 mg | ORAL_TABLET | Freq: Every day | ORAL | 3 refills | Status: DC

## 2016-05-18 MED ORDER — LEVOTHYROXINE SODIUM 150 MCG PO TABS: ORAL_TABLET | ORAL | 3 refills | Status: DC

## 2016-05-18 MED ORDER — GABAPENTIN 100 MG PO CAPS: 200.0000 mg | ORAL_CAPSULE | Freq: Three times a day (TID) | ORAL | 3 refills | Status: DC | PRN

## 2016-05-18 MED ORDER — MECLIZINE HCL 25 MG PO TABS: 25.0000 mg | ORAL_TABLET | Freq: Three times a day (TID) | ORAL | 1 refills | Status: DC | PRN

## 2016-05-18 MED ORDER — CITALOPRAM HYDROBROMIDE 10 MG PO TABS: 10.0000 mg | ORAL_TABLET | Freq: Every day | ORAL | 3 refills | Status: DC

## 2016-05-18 MED ORDER — CLOPIDOGREL BISULFATE 75 MG PO TABS
75.0000 mg | ORAL_TABLET | Freq: Every day | ORAL | 3 refills | Status: DC
Start: 2016-05-18 — End: 2016-09-17

## 2016-05-18 MED ORDER — ATORVASTATIN CALCIUM 40 MG PO TABS: 40.0000 mg | ORAL_TABLET | Freq: Every day | ORAL | 3 refills | Status: DC

## 2016-05-18 NOTE — Telephone Encounter (Signed)
Per Oretha Ellis, follow-up on message sent by [place]:  A note will be sent to the patient's provider to send all active prescriptions to [facility] to be filled and put in pill packs.   Spoke with patient to confirm all rx's will be sent to [facility]. Patient confirmed for they will deliver and provide a pill pack.  I called [facility] on [address] to have them disregard any pending refills on file   [facility] with the notation provide in a pill pack.

## 2016-05-19 ENCOUNTER — Other Ambulatory Visit: Payer: Self-pay | Admitting: Pharmacist

## 2016-05-19 NOTE — Patient Outreach (Signed)
[place] [facility]) Care Management  05/19/2016  Allison [name] 01-27-1934 [id number]   Patient was called to follow up on a pill pack referral.  Unfortunately, she did not answer the phone.  HIPAA compliant message was left on the patient's voicemail.  [place] was called to follow up on the pill packaging as well.  Patient's medications will not go through until 05/28/16 due to her insurance.  This was unfortunate to hear as I spoke with someone at [facility] last week and they said the patient would have medications delivered on Monday 05/18/16.  Plan:  Patient will be recalled to see if she needs another pill box fill until her medications can be delivered from [facility].  Elayne Guerin, PharmD, [place] Clinical Pharmacist [phone]

## 2016-05-21 ENCOUNTER — Other Ambulatory Visit: Payer: Self-pay | Admitting: *Deleted

## 2016-05-21 ENCOUNTER — Ambulatory Visit: Payer: Self-pay | Admitting: Pharmacist

## 2016-05-21 NOTE — Patient Outreach (Signed)
[place] [facility]) Care Management  05/21/2016  Allison [name] Feb 21, 1934 [id number]   CSW made an attempt to try and contact patient today to follow-up regarding social work services and resources, as well as to ensure that patient received the packet of resource information mailed to her home; however, patient was unavailable. CSW was unable to leave a HIPAA compliant message, as patient's phone just continued to ring.  CSW will make a second outreach attempt in one week. Nat Christen, BSW, MSW, LCSW  Licensed Education officer, environmental Health System  Mailing [place] [address] [place], [place] [address] Physical Address-[address], [place], [place] [address] Toll Free Main # [phone] Fax # [phone] Cell # [phone]  Office # [phone] [name]

## 2016-05-25 ENCOUNTER — Ambulatory Visit: Payer: Self-pay | Admitting: Pharmacist

## 2016-05-27 ENCOUNTER — Other Ambulatory Visit: Payer: Self-pay

## 2016-05-27 NOTE — Patient Outreach (Signed)
Telephone assessment: Attempted to reach patient without success. Reviewed EMR and noted that [place] worker and [facility] pharmacist have also been unable to reach patient.  PLAN: Will attempt again. If no response will mail a letter and close after 10 days.  Tomasa Rand, RN, BSN, CEN [facility] [employer] [phone]

## 2016-05-28 ENCOUNTER — Other Ambulatory Visit: Payer: Self-pay | Admitting: Pharmacist

## 2016-05-28 NOTE — Patient Outreach (Addendum)
[facility]) Care Management  05/28/2016  EDIN SKARDA October 26, 1934 [id number]  Patient was called to follow up on Pill pack referral.  HIPAA identifiers were obtained. Patient reported she has been taking her medications as prescribed since I saw her last. She also said she was unaware her pill pack was scheduled to filled and delivered today. [facility] was called. Unfortunately, they did not answer the phone. A message was left for someone to call me back with an update. Once the pill pack is delivered, I will conduct another home visit with the patient to review the pill pack system and provide a medication alarm to help her remember to take her medications.  Elayne Guerin, PharmD, BCACP [facility] Clinical Pharmacist [phone]   ADDENDUM:  [place] back.  As was previously discussed, Mrs. [place] medications were able to go into pill packing today and will be delivered to her tomorrow. The following medications will be in the pill packs:  [place] Losartan Levothyroxine Metoprolol clopidogrel citalopram atorvastatin gabapentin metformin Meclizine Chlorthalidone Pantoprazole   Called patient back to let her know about her medications being delivered in pill pack to her tomorrow. A home visit was also scheduled for 06/01/2016 to review the pill pack and get the patient set up with a med alarm.  Pharmacy was called back to inquire about Novolog 70/30.  Novolog cannot be filled by [facility] [facility] until 06/05/2016.  Plan: Home visit 06/01/16  Elayne Guerin, PharmD, [place] Clinical Pharmacist [phone]

## 2016-05-29 ENCOUNTER — Other Ambulatory Visit: Payer: Self-pay | Admitting: *Deleted

## 2016-05-29 NOTE — Patient Outreach (Signed)
[place] [facility]) Care Management  05/29/2016  Allison [name] 02-05-1934 [id number]   CSW made a second attempt to try and contact patient today to follow-up regarding social work services and resources, as well as to ensure that patient received the packet of resource information mailed to her home; however, patient was unavailable.  A HIPAA compliant message was left for patient on voicemail and CSW is currently awaiting a return call.  If CSW does not receive a return call from patient within the next week, CSW will make a third and final outreach attempt. Nat Christen, BSW, MSW, LCSW  Licensed Education officer, environmental Health System  Mailing [place] [address], [place], [place] [address] Physical Address-[address], [place], [place] [address] Toll Free Main # [phone] Fax # [phone] Cell # [phone]  Office # [phone] [email or web address]

## 2016-06-01 ENCOUNTER — Other Ambulatory Visit: Payer: Self-pay | Admitting: Pharmacist

## 2016-06-01 NOTE — Patient Outreach (Signed)
[facility]) Care Management  06/01/2016  COLIN NORMENT 1934-12-17 [id number]   Martin Majestic to patient's home for scheduled home visit to review the pill packing system that was supposed to be delivered from [facility].  Patient did not answer the door or the phone.  I taped by business card to the patient's door and left her a message on her voicemail.  Plan:  I will reach out to the patient later this week.  Elayne Guerin, PharmD, [place] Clinical Pharmacist [phone]

## 2016-06-02 ENCOUNTER — Other Ambulatory Visit: Payer: Self-pay | Admitting: Pharmacist

## 2016-06-03 ENCOUNTER — Ambulatory Visit (INDEPENDENT_AMBULATORY_CARE_PROVIDER_SITE_OTHER): Payer: Medicare Other | Admitting: Internal Medicine

## 2016-06-03 ENCOUNTER — Ambulatory Visit: Payer: Medicare Other

## 2016-06-03 ENCOUNTER — Encounter: Payer: Self-pay | Admitting: Internal Medicine

## 2016-06-03 VITALS — BP 138/78 | HR 88 | Temp 97.5°F | Ht 64.0 in | Wt 149.2 lb

## 2016-06-03 DIAGNOSIS — I639 Cerebral infarction, unspecified: Secondary | ICD-10-CM

## 2016-06-03 DIAGNOSIS — I10 Essential (primary) hypertension: Secondary | ICD-10-CM

## 2016-06-03 DIAGNOSIS — Z794 Long term (current) use of insulin: Secondary | ICD-10-CM | POA: Diagnosis not present

## 2016-06-03 DIAGNOSIS — F411 Generalized anxiety disorder: Secondary | ICD-10-CM | POA: Insufficient documentation

## 2016-06-03 DIAGNOSIS — G8194 Hemiplegia, unspecified affecting left nondominant side: Secondary | ICD-10-CM

## 2016-06-03 DIAGNOSIS — R4189 Other symptoms and signs involving cognitive functions and awareness: Secondary | ICD-10-CM

## 2016-06-03 DIAGNOSIS — E034 Atrophy of thyroid (acquired): Secondary | ICD-10-CM | POA: Diagnosis not present

## 2016-06-03 DIAGNOSIS — I63019 Cerebral infarction due to thrombosis of unspecified vertebral artery: Secondary | ICD-10-CM

## 2016-06-03 DIAGNOSIS — E1165 Type 2 diabetes mellitus with hyperglycemia: Secondary | ICD-10-CM | POA: Diagnosis not present

## 2016-06-03 DIAGNOSIS — IMO0002 Reserved for concepts with insufficient information to code with codable children: Secondary | ICD-10-CM

## 2016-06-03 DIAGNOSIS — Z8673 Personal history of transient ischemic attack (TIA), and cerebral infarction without residual deficits: Secondary | ICD-10-CM

## 2016-06-03 NOTE — Progress Notes (Signed)
Patient ID: Allison [name], female   DOB: 09-21-1934, 82 y.o.   MRN: [id number]    Location:  [facility] Place of Service: OFFICE  Chief Complaint  Patient presents with  . Medical Management of Chronic Issues    3 months routine visit    HPI:  82 yo female seen today for f/u. She has bruising at insulin injection site occasionally.  She is a poor historian due to cognitive deficit due to recent stroke. Hx obtained from chart.  HTN - BP stable on chlorthalidone and losartan. She checks BP at home.  Hyperlipidemia - improving. LDL 130(prev 130l)/Tchol 2100/HDL 44. Takes lipitor. No myalgias.  hypothyroidism - she takes levothyroxine.TSH 1.885. No constipation or diarrhea but does have increased anxiety/depression, palpitations. She gained 4 lbs since mid April 2018  DM - improving. A1c 8%. BS fluctuating but improved 200-399 most times and occasional <190 reading. She takes metformin and novolog 70/30 - 20 units BID. Numbness in fingers. She saw eye specialist in Feb 2017. No diabetic changes seen. 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 Uses magnifying glass to read at times. She reported angioedema with lantus and levemir. She takes gabapentin for neuropathy and losartan for renal protection  GERD - reflux sx's stable on pantoprazole  Anxiety - uncontrolled. Off lorazepam. She has been taking citalopram prn  Hx CVA with slurred speech/RLE hemiparesis (dx Oct 2017 - right internal capsule lacunar infarct) - taking plavix daily. She has some memory deficit   Past Medical History:  Diagnosis Date  . Carpal tunnel syndrome   . Diabetes mellitus without complication ([place])   . Dysphagia   . GERD (gastroesophageal reflux disease)   . Hiatal hernia   . HLD (hyperlipidemia)   . Hx of adenomatous colonic polyps 06/19/2014  . Hypertension   . Hypothyroidism   . Onychomycosis 03/05/2012  . Pain in lower limb 04/18/2013  . Sciatic nerve pain   . Vertigo     Past Surgical History:  Procedure Laterality  Date  . BLADDER SURGERY     Mesh implant  . COLONOSCOPY    . LARYNX SURGERY     vocal cords  . PARTIAL HYSTERECTOMY  1978  . VESICOVAGINAL FISTULA CLOSURE W/ TAH  1976    Patient Care Team: Gildardo Cranker, DO as PCP - General (Internal Medicine) Thana Ates, RN as [place] Management [place], LCSW as [place], [place], [facility] as [employer]/product designer)  Social History   Social History  . Marital status: Single    Spouse name: N/A  . Number of children: 2  . Years of education: N/A   Occupational History  . Retired    Social History Main Topics  . Smoking status: Never Smoker  . Smokeless tobacco: Never Used  . Alcohol use No  . Drug use: No  . Sexual activity: No   Other Topics Concern  . Not on file   Social History Narrative   Single   2 sons   Retired   2 cups caffeine daily   05/01/2014        reports that she has never smoked. She has never used smokeless tobacco. She reports that she does not drink alcohol or use drugs.  Family History  Problem Relation Age of Onset  . Alzheimer's disease Mother   . Glaucoma Mother   . Heart disease Father   . Alzheimer's disease Father   . Asthma Sister   .  Allergies Sister   . Allergies Sister   . Sleep apnea Sister   . Heart disease Sister   . Breast cancer Maternal Aunt   . Pancreatic cancer Maternal Aunt   . Prostate cancer Maternal Uncle   . Prostate cancer Paternal Uncle   . Alcoholism Maternal Uncle   . Kidney disease Maternal Aunt   . Heart disease Maternal Aunt    Family Status  Relation Status  . Mother Deceased  . Father Deceased  . Sister Alive  . Sister Alive  . Son Alive  . Son Alive  . Sister (Not Specified)  . Sister (Not Specified)  . Mat Aunt (Not Specified)  . Mat Aunt (Not Specified)  . Mat Uncle (Not Specified)  . Annamarie Major (Not Specified)  . Mat Uncle (Not Specified)  . Mat  Aunt (Not Specified)  . Mat Aunt (Not Specified)     Allergies  Allergen Reactions  . Lantus [Insulin Glargine] Itching and Swelling    angioedema  . Levemir [Insulin Detemir] Itching and Swelling    angioedema  . Pineapple Anaphylaxis  . Codeine Other (See Comments)    unknown  . Invokana [Canagliflozin] Other (See Comments)    Constipation, nightmares  . Penicillins Other (See Comments)    Unknown   . Tradjenta [Linagliptin] Other (See Comments)  . Vioxx [Rofecoxib] Other (See Comments)    unknown    Medications: Patient's Medications  New Prescriptions   No medications on file  Previous Medications   ATORVASTATIN (LIPITOR) 40 MG TABLET    Take 1 tablet (40 mg total) by mouth at bedtime.   BETAMETHASONE DIPROPIONATE (DIPROLENE) 0.05 % CREAM    Apply topically 2 (two) times daily.   BLOOD GLUCOSE METER KIT AND SUPPLIES KIT    Check blood glucose twice daily before insulin therapy (ICD-10 E11.21, E11.65).   CHLORTHALIDONE (HYGROTON) 25 MG TABLET    Take 0.5 tablets (12.5 mg total) by mouth daily.   CITALOPRAM (CELEXA) 10 MG TABLET    Take 1 tablet (10 mg total) by mouth daily. For anxiety   CLOPIDOGREL (PLAVIX) 75 MG TABLET    Take 1 tablet (75 mg total) by mouth daily.   GABAPENTIN (NEURONTIN) 100 MG CAPSULE    Take 2 capsules (200 mg total) by mouth 3 (three) times daily as needed (nerve pain).   GLUCOSE BLOOD (ONE TOUCH ULTRA TEST) TEST STRIP    Use to test blood sugar three times daily. Dx: E11.21   INSULIN ASPART PROTAMINE- ASPART (NOVOLOG MIX 70/30) (70-30) 100 UNIT/ML INJECTION    Inject 0.2 mLs (20 Units total) into the skin 2 (two) times daily with a meal.   INSULIN PEN NEEDLE (FIFTY50 PEN NEEDLES) 32G X 4 MM MISC    1 each by Misc.(Non-Drug; Combo Route) route.   LEVOTHYROXINE (SYNTHROID, LEVOTHROID) 150 MCG TABLET    take 1 tablet by mouth every morning ON AN EMPTY STOMACH for THROID   LOSARTAN (COZAAR) 100 MG TABLET    Take 1 tablet (100 mg total) by mouth daily.     MECLIZINE (ANTIVERT) 25 MG TABLET    Take 1 tablet (25 mg total) by mouth 3 (three) times daily as needed for dizziness.   METFORMIN (GLUCOPHAGE) 500 MG TABLET    take 2 tablets by mouth twice a day with food   ONETOUCH DELICA LANCETS 38G MISC    Use to test blood sugar three times daily. Dx: E11.21   PANTOPRAZOLE (PROTONIX) 40 MG TABLET  Take 1 tablet (40 mg total) by mouth daily.   VITAMIN E (VITAMIN E) 1000 UNIT CAPSULE    Take 1,000 Units by mouth daily.  Modified Medications   No medications on file  Discontinued Medications   No medications on file    Review of Systems  Unable to perform ROS: Other (cognitive impairment)    Vitals:   06/03/16 1153  BP: 138/78  Pulse: 88  Temp: 97.5 F (36.4 C)  TempSrc: Oral  SpO2: 98%  Weight: 149 lb 3.2 oz (67.7 kg)  Height: _0  (1.626 m)   Body mass index is 25.61 kg/m.  Physical Exam  Constitutional: She appears well-developed and well-nourished. No distress.  HENT:  Mouth/Throat: Oropharynx is clear and moist. No oropharyngeal exudate.  Eyes: Pupils are equal, round, and reactive to light. No scleral icterus.  Neck: Neck supple. Carotid bruit is not present. No tracheal deviation present. No thyromegaly present.  Cardiovascular: Normal rate, regular rhythm and intact distal pulses.  Exam reveals no gallop and no friction rub.   Murmur (1/6 SEM) heard. Trace LE edema b/l. no calf TTP.   Pulmonary/Chest: Effort normal and breath sounds normal. No stridor. No respiratory distress. She has no wheezes. She has no rales.  Abdominal: Soft. Bowel sounds are normal. She exhibits no distension and no mass. There is no hepatomegaly. There is no tenderness. There is no rebound and no guarding.  Yellow bruising but no palpable mass  Musculoskeletal: She exhibits edema.  Lymphadenopathy:    She has no cervical adenopathy.  Neurological: She is alert.  Right facial droop; speech slurred; /LUERLE hemiparesis (strength 4/5); gait  unsteady  Skin: Skin is warm and dry. No rash noted.  Left lateral malleolus lipoma egg sized, NT  Psychiatric: She has a normal mood and affect. Her behavior is normal. Thought content normal.     Labs reviewed: Admission on 04/02/2016, Discharged on 04/02/2016  Component Date Value Ref Range Status  . Sodium 04/02/2016 139  135 - 145 mmol/L Final  . Potassium 04/02/2016 3.2* 3.5 - 5.1 mmol/L Final  . Chloride 04/02/2016 101  101 - 111 mmol/L Final  . CO2 04/02/2016 28  22 - 32 mmol/L Final  . Glucose, Bld 04/02/2016 180* 65 - 99 mg/dL Final  . BUN 04/02/2016 8  6 - 20 mg/dL Final  . Creatinine, Ser 04/02/2016 0.78  0.44 - 1.00 mg/dL Final  . Calcium 04/02/2016 9.7  8.9 - 10.3 mg/dL Final  . GFR calc non Af Amer 04/02/2016 >60  >60 mL/min Final  . GFR calc Af Amer 04/02/2016 >60  >60 mL/min Final   Comment: (NOTE) The eGFR has been calculated using the CKD EPI equation. This calculation has not been validated in all clinical situations. eGFR's persistently <60 mL/min signify possible Chronic Kidney Disease.   . Anion gap 04/02/2016 10  5 - 15 Final  . WBC 04/02/2016 7.5  4.0 - 10.5 K/uL Final  . RBC 04/02/2016 5.01  3.87 - 5.11 MIL/uL Final  . Hemoglobin 04/02/2016 13.5  12.0 - 15.0 g/dL Final  . HCT 04/02/2016 40.3  36.0 - 46.0 % Final  . MCV 04/02/2016 80.4  78.0 - 100.0 fL Final  . MCH 04/02/2016 26.9  26.0 - 34.0 pg Final  . MCHC 04/02/2016 33.5  30.0 - 36.0 g/dL Final  . RDW 04/02/2016 14.6  11.5 - 15.5 % Final  . Platelets 04/02/2016 236  150 - 400 K/uL Final  . Glucose-Capillary 04/02/2016 189* 65 -  99 mg/dL Final  . Alcohol, Ethyl (B) 04/02/2016 <5  <5 mg/dL Final   Comment:        LOWEST DETECTABLE LIMIT FOR SERUM ALCOHOL IS 5 mg/dL FOR MEDICAL PURPOSES ONLY   . Troponin i, poc 04/02/2016 0.00  0.00 - 0.08 ng/mL Final  . Comment 3 04/02/2016          Final   Comment: Due to the release kinetics of cTnI, a negative result within the first hours of the onset of  symptoms does not rule out myocardial infarction with certainty. If myocardial infarction is still suspected, repeat the test at appropriate intervals.   . Color, Urine 04/02/2016 YELLOW  YELLOW Final  . APPearance 04/02/2016 CLEAR  CLEAR Final  . Specific Gravity, Urine 04/02/2016 1.010  1.005 - 1.030 Final  . pH 04/02/2016 5.0  5.0 - 8.0 Final  . Glucose, UA 04/02/2016 >=500* NEGATIVE mg/dL Final  . Hgb urine dipstick 04/02/2016 NEGATIVE  NEGATIVE Final  . Bilirubin Urine 04/02/2016 NEGATIVE  NEGATIVE Final  . Ketones, ur 04/02/2016 NEGATIVE  NEGATIVE mg/dL Final  . Protein, ur 04/02/2016 NEGATIVE  NEGATIVE mg/dL Final  . Nitrite 04/02/2016 NEGATIVE  NEGATIVE Final  . Leukocytes, UA 04/02/2016 MODERATE* NEGATIVE Final  . RBC / HPF 04/02/2016 0-5  0 - 5 RBC/hpf Final  . WBC, UA 04/02/2016 6-30  0 - 5 WBC/hpf Final  . Bacteria, UA 04/02/2016 RARE* NONE SEEN Final  . Squamous Epithelial / LPF 04/02/2016 0-5* NONE SEEN Final  . Mucous 04/02/2016 PRESENT   Final  . Hyaline Casts, UA 04/02/2016 PRESENT   Final  . Neutrophils Relative % 04/02/2016 58  % Final  . Neutro Abs 04/02/2016 4.3  1.7 - 7.7 K/uL Final  . Lymphocytes Relative 04/02/2016 30  % Final  . Lymphs Abs 04/02/2016 2.2  0.7 - 4.0 K/uL Final  . Monocytes Relative 04/02/2016 7  % Final  . Monocytes Absolute 04/02/2016 0.5  0.1 - 1.0 K/uL Final  . Eosinophils Relative 04/02/2016 4  % Final  . Eosinophils Absolute 04/02/2016 0.3  0.0 - 0.7 K/uL Final  . Basophils Relative 04/02/2016 1  % Final  . Basophils Absolute 04/02/2016 0.1  0.0 - 0.1 K/uL Final  . Specimen Description 04/02/2016 URINE, CLEAN CATCH   Final  . Special Requests 04/02/2016 NONE   Final  . Culture 04/02/2016 MULTIPLE SPECIES PRESENT, SUGGEST RECOLLECTION*  Final  . Report Status 04/02/2016 04/03/2016 FINAL   Final    No results found.   Assessment/Plan   ICD-9-CM ICD-10-CM   1. Uncontrolled type 2 diabetes mellitus with hyperglycemia, with long-term  current use of insulin (HCC) 250.02 E11.65 CMP with eGFR   V58.67 Z79.4 Lipid Panel     Hemoglobin A1c  2. Hypothyroidism due to acquired atrophy of thyroid 244.8 E03.4 TSH   246.8  T4, Free  3. Essential hypertension 401.9 I10   4. Anxiety state 300.00 F41.1   5. History of stroke V12.54 Z86.73   6. Hemiparesis of left nondominant side due to cerebral infarction (HCC) 438.22 I63.9     G81.94   7. Cognitive impairment 294.9 R41.89    Continue current medications as ordered  Return to office as scheduled for fasting labs and AWV on 06/10/16  Follow up with Tivis Ringer as scheduled  Follow up in 3 mos for DM, HTN, hyperlipidemia.    Tetsuo Coppola S. [place], [facility] [address]  [place], [place] [address] [phone] Cell (Monday-Friday 8 AM - 5 PM) [phone] After 5 PM and follow prompts

## 2016-06-03 NOTE — Patient Outreach (Signed)
[facility]) Care Management  06/03/2016  ISSABELLE MCRANEY 1934-01-22 [id number]   [place] to follow up on the pill packing system for Ms. Lovena Le. Pharmacy Technician from [place] reported Ms. Daugherty's medications were filled in pill packing but were unable to be delivered.  She could not say why the medications were not delivered.  But she could see they were sent out for delivery on Monday Jun 01, 2016.  She said the delivery person will not leave the pill packs without a signature.  Plan:  I will continue to try to reach the patient.  I will let her provider and [place] Nurse know what is going on.  Elayne Guerin, PharmD, [place] Clinical Pharmacist [phone]

## 2016-06-03 NOTE — Patient Instructions (Addendum)
Continue current medications as ordered  Return to office as scheduled for fasting labs and AWV on 06/10/16  Follow up with Tivis Ringer as scheduled  Follow up in 3 mos for DM, HTN, hyperlipidemia.

## 2016-06-04 ENCOUNTER — Other Ambulatory Visit: Payer: Self-pay | Admitting: Pharmacist

## 2016-06-04 NOTE — Patient Outreach (Signed)
[place] [facility]) Care Management  06/04/2016  FARIA CASELLA June 22, 1934 [id number]   Patient was called to follow up on medication adherence/pill pack referral. HIPAA identifiers were obtained.  Patient confirmed she has not received her medications via pill pack from [facility].  Patient said she has had multiple appointments and has been in and out of the house.  (She also missed our home visit appointment on 06/01/16).  She shared she was on her way to breakfast with her sister.  Since she will be out of the house today, patient was encouraged to reach out to [facility] or even go by there and pick up her medications.  [place] was called.  The pharmacy technician at [facility] confirmed they attempted to deliver Ms. Roediger's twice (06/01/16 and 06/03/16) and no one was at home to sign for the medications.  The pharmacy was asked to hold the patient's medications for today and not attempt to deliver them since she already said she is going out. (Patient was asked to schedule a time for pick up or to physically go by the pharmacy and pick up the pill pack).  If they try to deliver too many times and the patient is not at home, the patient can be disqualified from delivery services.    Plan:  1.  Follow up with the patient within 5-7 business days.  2. Consult with patient's [place], Tomasa Rand.  Elayne Guerin, PharmD, [place] Clinical Pharmacist [phone]

## 2016-06-05 ENCOUNTER — Other Ambulatory Visit: Payer: Self-pay | Admitting: *Deleted

## 2016-06-05 ENCOUNTER — Other Ambulatory Visit: Payer: Self-pay

## 2016-06-05 NOTE — Patient Outreach (Addendum)
Telephone assessment: Placed follow up call to patient who reports that she is doing well. Reports that she saw her primary MD this week and her doctor said she was doing well. Patient reports that her CBG readings are better but she is not able to provide me with the last several days readings.  Reports that she slept in today and just got up.   Patient reports that her CBG are running over 100's .  States that her goal is 100.  Patient reports that she is taking her medications as prescribed. States that she does not have her bubble pack yet but will pick it up today.  Reports that she is taking her insulin as Rx.   Reviewed nursing goals and patient states that she knows what to eat, knows to exercise and knows how to daily monitor.  Declines any further needs from [place].  I reviewed with patient that the [facility] pharmacist would continue to monitor her medication needs.   Reviewed with patient to call this [facility] nurse if she needs more assistance in the future.  Plan: Will notify [place] and [facility] MD of nursing closing.    Care plan update on 5/30 due to EPIC problems with transfer of data on care plan.  THN CM Care Plan Problem One     Most Recent Value  Care Plan Problem One  Recent UTI and high CBG readings per patient report.  Role Documenting the Problem One  Care Management [place] for Problem One  Active  [name]   Patient will report decreased fasting CBG readings to under 200 in the next 60 days.  [name] Start Date  04/06/16  [facility] Long Term Goal Met Date  05/29/16  Interventions for Problem One Long Term Goal  Long discussion with patient about DM and daily management. Encouraged patient to take medications as prescribed.   THN CM Short Term Goal #1   Patient will verbalize daily monitoring of CBG fasting in the next 30 days.  THN CM Short Term Goal #1 Start Date  04/06/16  [facility] CM Short Term Goal #1 Met Date  06/05/16  Interventions for  Short Term Goal #1  Reviewed CBG readfings from CBG machine with patient and son.  Encouraged patient to take her medications as prescribed.  Provided pill box  THN CM Short Term Goal #2   Patient will report following up with MD in 1 week.  THN CM Short Term Goal #2 Start Date  04/06/16  [facility] CM Short Term Goal #2 Met Date  06/05/16  Interventions for Short Term Goal #2  Reviewed pending MD appointment for tomorrow.   THN CM Short Term Goal #3  Patient will report  exercsing for 15 minutes twice a week for 14 days.   THN CM Short Term Goal #3 Start Date  04/27/16  [facility] CM Short Term Goal #3 Met Date  06/05/16  Interventions for Short Tern Goal #3  Reviewed importance of daily exercise.   THN CM Short Term Goal #4  Son will be responsible for patient taking her medications as prescribed for the next 14 days.  THN CM Short Term Goal #4 Start Date  04/27/16  [facility] CM Short Term Goal #4 Met Date  06/05/16     Tomasa Rand, RN, BSN, CEN [place] Coordinator [phone]

## 2016-06-05 NOTE — Patient Outreach (Signed)
[facility]) Care Management  06/05/2016  Allison [name] 08/06/1934 [id number]   CSW was able to make contact with patient today to follow-up regarding social work services and resources, as well as to ensure no additional social work needs have been identified at this time. Patient admits that she is utilizing the resources provided to her by CSW and that she is gaining weight and slowing regaining her strength and mobility.  Patient denies any additional social work needs at present. CSW will perform a case closure on patient, as all goals of treatment have been met from social work standpoint and no additional social work needs have been identified at this time.  CSW will notify patient's RNCM with [place] Management, Tomasa Rand of CSW's plans to close patient's case.  CSW will fax an update to patient's Primary Care Physician, Dr. Gildardo Cranker to ensure that they are aware of CSW's involvement with patient's plan of care.  CSW will submit a case closure request to Verlon Setting, Care Management Assistant with [place] Management, in the form of an [employer].  CSW will ensure that Mrs. Comer is aware of Mrs. Jonathon Jordan, RNCM with [place] Management, continued involvement with patient's care. Nat Christen, BSW, MSW, LCSW  Licensed Education officer, [facility]  Mailing [place] [address]. [address], [place], [place] [address] Physical Address-[address], [place], [place] [address] Toll Free Main # [phone] Fax # [phone] Cell # [phone]  Office # [phone] [email or web address]

## 2016-06-10 ENCOUNTER — Ambulatory Visit (INDEPENDENT_AMBULATORY_CARE_PROVIDER_SITE_OTHER): Payer: Medicare Other

## 2016-06-10 ENCOUNTER — Other Ambulatory Visit: Payer: Medicare Other

## 2016-06-10 VITALS — BP 130/70 | HR 89 | Temp 97.8°F | Ht 64.0 in | Wt 149.0 lb

## 2016-06-10 DIAGNOSIS — E1165 Type 2 diabetes mellitus with hyperglycemia: Secondary | ICD-10-CM | POA: Diagnosis not present

## 2016-06-10 DIAGNOSIS — Z0000 Encounter for general adult medical examination without abnormal findings: Secondary | ICD-10-CM | POA: Diagnosis not present

## 2016-06-10 DIAGNOSIS — E034 Atrophy of thyroid (acquired): Secondary | ICD-10-CM | POA: Diagnosis not present

## 2016-06-10 DIAGNOSIS — Z794 Long term (current) use of insulin: Secondary | ICD-10-CM

## 2016-06-10 LAB — COMPLETE METABOLIC PANEL WITH GFR
ALT: 11 U/L (ref 6–29)
AST: 15 U/L (ref 10–35)
Albumin: 4 g/dL (ref 3.6–5.1)
Alkaline Phosphatase: 69 U/L (ref 33–130)
BILIRUBIN TOTAL: 0.8 mg/dL (ref 0.2–1.2)
BUN: 11 mg/dL (ref 7–25)
CALCIUM: 9.8 mg/dL (ref 8.6–10.4)
CHLORIDE: 102 mmol/L (ref 98–110)
CO2: 32 mmol/L — ABNORMAL HIGH (ref 20–31)
CREATININE: 0.91 mg/dL (ref 0.60–0.93)
GFR, EST NON AFRICAN AMERICAN: 60 mL/min (ref >=60)
GFR, Est African American: 69 mL/min (ref >=60)
Glucose, Bld: 237 mg/dL — ABNORMAL HIGH (ref 65–99)
Potassium: 4 mmol/L (ref 3.5–5.3)
Sodium: 138 mmol/L (ref 135–146)
Total Protein: 7 g/dL (ref 6.1–8.1)

## 2016-06-10 LAB — LIPID PANEL
CHOLESTEROL: 236 mg/dL — AB (ref <200)
HDL: 53 mg/dL (ref >50)
LDL Cholesterol: 165 mg/dL — ABNORMAL HIGH (ref <100)
Total CHOL/HDL Ratio: 4.5 Ratio (ref <5.0)
Triglycerides: 89 mg/dL (ref <150)
VLDL: 18 mg/dL (ref <30)

## 2016-06-10 LAB — TSH: TSH: 11.56 mIU/L — ABNORMAL HIGH

## 2016-06-10 LAB — T4, FREE: FREE T4: 1.2 ng/dL (ref 0.8–1.8)

## 2016-06-10 NOTE — Progress Notes (Addendum)
Subjective:   Allison [name] is a 82 y.o. female who presents for an Initial Medicare Annual Wellness Visit.        Objective:    Today's Vitals   06/10/16 0951  BP: 130/70  Pulse: 89  Temp: 97.8 F (36.6 C)  TempSrc: Oral  SpO2: 98%  Weight: 149 lb (67.6 kg)  Height: '5\' 4"'  (1.626 m)   Body mass index is 25.58 kg/m.   Current Medications (verified) Outpatient Encounter Prescriptions as of 06/10/2016  Medication Sig  . atorvastatin (LIPITOR) 40 MG tablet Take 1 tablet (40 mg total) by mouth at bedtime.  . blood glucose meter kit and supplies KIT Check blood glucose twice daily before insulin therapy (ICD-10 E11.21, E11.65).  . chlorthalidone (HYGROTON) 25 MG tablet Take 0.5 tablets (12.5 mg total) by mouth daily.  . citalopram (CELEXA) 10 MG tablet Take 1 tablet (10 mg total) by mouth daily. For anxiety  . clopidogrel (PLAVIX) 75 MG tablet Take 1 tablet (75 mg total) by mouth daily.  Marland Kitchen gabapentin (NEURONTIN) 100 MG capsule Take 2 capsules (200 mg total) by mouth 3 (three) times daily as needed (nerve pain).  Marland Kitchen glucose blood (ONE TOUCH ULTRA TEST) test strip Use to test blood sugar three times daily. Dx: E11.21  . insulin aspart protamine- aspart (NOVOLOG MIX 70/30) (70-30) 100 UNIT/ML injection Inject 0.2 mLs (20 Units total) into the skin 2 (two) times daily with a meal.  . Insulin Pen Needle (FIFTY50 PEN NEEDLES) 32G X 4 MM MISC 1 each by Misc.(Non-Drug; Combo Route) route.  Marland Kitchen levothyroxine (SYNTHROID, LEVOTHROID) 150 MCG tablet take 1 tablet by mouth every morning ON AN EMPTY STOMACH for THROID  . losartan (COZAAR) 100 MG tablet Take 1 tablet (100 mg total) by mouth daily.  . meclizine (ANTIVERT) 25 MG tablet Take 1 tablet (25 mg total) by mouth 3 (three) times daily as needed for dizziness.  . metFORMIN (GLUCOPHAGE) 500 MG tablet take 2 tablets by mouth twice a day with food  . ONETOUCH DELICA LANCETS 69G MISC Use to test blood sugar three times daily. Dx: E11.21  .  pantoprazole (PROTONIX) 40 MG tablet Take 1 tablet (40 mg total) by mouth daily.  . vitamin E (VITAMIN E) 1000 UNIT capsule Take 1,000 Units by mouth daily.  . betamethasone dipropionate (DIPROLENE) 0.05 % cream Apply topically 2 (two) times daily.   No facility-administered encounter medications on file as of 06/10/2016.     Allergies (verified) Lantus [insulin glargine]; Levemir [insulin detemir]; Pineapple; Codeine; Invokana [canagliflozin]; Penicillins; Tradjenta [linagliptin]; and Vioxx [rofecoxib]   History: Past Medical History:  Diagnosis Date  . Carpal tunnel syndrome   . Diabetes mellitus without complication ([place])   . Dysphagia   . GERD (gastroesophageal reflux disease)   . Hiatal hernia   . HLD (hyperlipidemia)   . Hx of adenomatous colonic polyps 06/19/2014  . Hypertension   . Hypothyroidism   . Onychomycosis 03/05/2012  . Pain in lower limb 04/18/2013  . Sciatic nerve pain   . Vertigo    Past Surgical History:  Procedure Laterality Date  . BLADDER SURGERY     Mesh implant  . COLONOSCOPY    . LARYNX SURGERY     vocal cords  . PARTIAL HYSTERECTOMY  1978  . VESICOVAGINAL FISTULA CLOSURE W/ TAH  1976   Family History  Problem Relation Age of Onset  . Alzheimer's disease Mother   . Glaucoma Mother   . Heart disease Father   .  Alzheimer's disease Father   . Asthma Sister   . Allergies Sister   . Allergies Sister   . Sleep apnea Sister   . Heart disease Sister   . Breast cancer Maternal Aunt   . Pancreatic cancer Maternal Aunt   . Prostate cancer Maternal Uncle   . Prostate cancer Paternal Uncle   . Alcoholism Maternal Uncle   . Kidney disease Maternal Aunt   . Heart disease Maternal Aunt    Social History   Occupational History  . Retired    Social History Main Topics  . Smoking status: Never Smoker  . Smokeless tobacco: Never Used  . Alcohol use No  . Drug use: No  . Sexual activity: No    Tobacco Counseling Counseling given: Not  Answered   Activities of Daily Living In your present state of health, do you have any difficulty performing the following activities: 06/10/2016 05/14/2016  Hearing? N Y  Vision? N N  Difficulty concentrating or making decisions? N Y  Walking or climbing stairs? N Y  Dressing or bathing? N N  Doing errands, shopping? Allison [name]  Preparing Food and eating ? N N  Using the Toilet? N N  In the past six months, have you accidently leaked urine? Y Y  Do you have problems with loss of bowel control? N N  Managing your Medications? Y Y  Managing your Finances? N N  Housekeeping or managing your Housekeeping? N N  Some recent data might be hidden    Immunizations and Health Maintenance Immunization History  Administered Date(s) Administered  . Influenza Whole 09/13/2011  . Influenza,inj,Quad PF,36+ Mos 12/05/2013, 12/31/2014, 11/15/2015  . Pneumococcal Conjugate-13 03/27/2014  . Pneumococcal Polysaccharide-23 04/28/2016  . Td 06/13/2003   Health Maintenance Due  Topic Date Due  . OPHTHALMOLOGY EXAM  12/30/1944    Patient Care Team: Allison Cranker, DO as PCP - General (Internal Medicine) Elayne Guerin, [facility] as [place] Management (Pharmacist)  Indicate any recent Medical Services you may have received from other than [facility] providers in the past year (date may be approximate).     Assessment:   This is a routine wellness examination for Allison [name].   Hearing/Vision screen No exam data present  Dietary issues and exercise activities discussed: Current Exercise Habits: The patient does not participate in regular exercise at present, Exercise limited by: None identified  Goals    . Decrease soda or juice intake by choosing diet soda instead    . Exercise 3x per week (30 min per time)          Starting today (when silver sneakers happens) pt will go to the gym 3-4 X a week.    Marland Kitchen HEMOGLOBIN A1C < 7.0    . Reduce portion size of carbs to 3 servings per meal       Depression Screen PHQ 2/9 Scores 06/10/2016 05/14/2016 05/14/2016 04/27/2016 01/23/2016 11/21/2015 10/08/2014  PHQ - 2 Score '2 5 5 6 ' 0 0 0  PHQ- 9 Score '4 11 10 12 ' - - -    Fall Risk Fall Risk  06/10/2016 06/03/2016 05/14/2016 04/28/2016 04/27/2016  Falls in the past year? No No Yes No Yes  Number falls in past yr: - - 2 or more - 2 or more  Injury with Fall? - - No - No  Risk Factor Category  - - High Fall Risk - High Fall Risk  Risk for fall due to : - - History of fall(s);Impaired  balance/gait;Impaired mobility - -  Risk for fall due to (comments): - - - - -  Follow up - - Education provided;Falls prevention discussed - Falls prevention discussed    Cognitive Function: MMSE - Mini Mental State Exam 05/11/2016 04/28/2016 12/05/2013  Orientation to time '5 5 5  ' Orientation to Place '5 5 5  ' Registration '3 3 3  ' Attention/ Calculation '5 5 5  ' Recall '2 2 2  ' Language- name 2 objects '2 2 2  ' Language- repeat '1 1 1  ' Language- follow 3 step command '3 3 3  ' Language- read & follow direction '1 1 1  ' Write a sentence '1 1 1  ' Copy design '1 1 1  ' Total score '29 29 29        ' Screening Tests Health Maintenance  Topic Date Due  . OPHTHALMOLOGY EXAM  12/30/1944  . TETANUS/TDAP  08/27/2016 (Originally 06/12/2013)  . INFLUENZA VACCINE  08/12/2016  . HEMOGLOBIN A1C  08/12/2016  . FOOT EXAM  09/03/2016  . DEXA SCAN  Completed  . PNA vac Low Risk Adult  Completed      Plan:    I have personally reviewed and addressed the Medicare Annual Wellness questionnaire and have noted the following in the patient's chart:  A. Medical and social history B. Use of alcohol, tobacco or illicit drugs  C. Current medications and supplements D. Functional ability and status E.  Nutritional status F.  Physical activity G. Advance directives H. List of other physicians I.  Hospitalizations, surgeries, and ER visits in previous 12 months J.  [place] to include hearing, vision, cognitive,  depression L. Referrals and appointments - none  In addition, I have reviewed and discussed with patient certain preventive protocols, quality metrics, and best practice recommendations. A written personalized care plan for preventive services as well as general preventive health recommendations were provided to patient.  See attached scanned questionnaire for additional information.   Signed,   Rich Reining, RN Nurse Health Advisor  I have reviewed the health advisor's note and was available for consultation. I agree with documentation and plan.   Monica S. Perlie Gold  [facility] [address] [place], [place] [address] [phone] Cell (Monday-Friday 8 AM - 5 PM) [phone] After 5 PM and follow prompts

## 2016-06-10 NOTE — Progress Notes (Signed)
Quick Notes   Health Maintenance: None     Abnormal Screen: MMSE 29/30. Taken 1 month ago     Patient Concerns: Lots of gas recently- told her to try eliminating foods for a little to see if it helps.      Nurse Concerns: Pt says she takes Celexa when she feels she needs it. But also talked about how she feels anxious, PHQ-4. I explained the importance of taking that med consistently.

## 2016-06-10 NOTE — Patient Instructions (Signed)
Allison [name] , Thank you for taking time to come for your Medicare Wellness Visit. I appreciate your ongoing commitment to your health goals. Please review the following plan we discussed and let me know if I can assist you in the future.   Screening recommendations/referrals: Colonoscopy up to date. Mammogram up to date Bone Density up to date Recommended yearly ophthalmology/optometry visit for glaucoma screening and checkup Recommended yearly dental visit for hygiene and checkup  Vaccinations: Influenza vaccine due 11/14/2016 Pneumococcal vaccine up to date Tdap vaccine due. Shingles vaccine due.   Advanced directives: Please bring in a copy when you have it.   Conditions/risks identified: None  Next appointment: none upcoming   Preventive Care 65 Years and Older, Female Preventive care refers to lifestyle choices and visits with your health care provider that can promote health and wellness. What does preventive care include?  A yearly physical exam. This is also called an annual well check.  Dental exams once or twice a year.  Routine eye exams. Ask your health care provider how often you should have your eyes checked.  Personal lifestyle choices, including:  Daily care of your teeth and gums.  Regular physical activity.  Eating a healthy diet.  Avoiding tobacco and drug use.  Limiting alcohol use.  Practicing safe sex.  Taking low-dose aspirin every day.  Taking vitamin and mineral supplements as recommended by your health care provider. What happens during an annual well check? The services and screenings done by your health care provider during your annual well check will depend on your age, overall health, lifestyle risk factors, and family history of disease. Counseling  Your health care provider may ask you questions about your:  Alcohol use.  Tobacco use.  Drug use.  Emotional well-being.  Home and relationship well-being.  Sexual  activity.  Eating habits.  History of falls.  Memory and ability to understand (cognition).  Work and work Statistician.  Reproductive health. Screening  You may have the following tests or measurements:  Height, weight, and BMI.  Blood pressure.  Lipid and cholesterol levels. These may be checked every 5 years, or more frequently if you are over 81 years old.  Skin check.  Lung cancer screening. You may have this screening every year starting at age 46 if you have a 30-pack-year history of smoking and currently smoke or have quit within the past 15 years.  Fecal occult blood test (FOBT) of the stool. You may have this test every year starting at age 9.  Flexible sigmoidoscopy or colonoscopy. You may have a sigmoidoscopy every 5 years or a colonoscopy every 10 years starting at age 55.  Hepatitis C blood test.  Hepatitis B blood test.  Sexually transmitted disease (STD) testing.  Diabetes screening. This is done by checking your blood sugar (glucose) after you have not eaten for a while (fasting). You may have this done every 1-3 years.  Bone density scan. This is done to screen for osteoporosis. You may have this done starting at age 25.  Mammogram. This may be done every 1-2 years. Talk to your health care provider about how often you should have regular mammograms. Talk with your health care provider about your test results, treatment options, and if necessary, the need for more tests. Vaccines  Your health care provider may recommend certain vaccines, such as:  Influenza vaccine. This is recommended every year.  Tetanus, diphtheria, and acellular pertussis (Tdap, Td) vaccine. You may need a Td booster every 10  years.  Zoster vaccine. You may need this after age 29.  Pneumococcal 13-valent conjugate (PCV13) vaccine. One dose is recommended after age 68.  Pneumococcal polysaccharide (PPSV23) vaccine. One dose is recommended after age 25. Talk to your health care  provider about which screenings and vaccines you need and how often you need them. This information is not intended to replace advice given to you by your health care provider. Make sure you discuss any questions you have with your health care provider. Document Released: 01/25/2015 Document Revised: 09/18/2015 Document Reviewed: 10/30/2014 Elsevier Interactive Patient Education  2017 [place] Prevention in the Home Falls can cause injuries. They can happen to people of all ages. There are many things you can do to make your home safe and to help prevent falls. What can I do on the outside of my home?  Regularly fix the edges of walkways and driveways and fix any cracks.  Remove anything that might make you trip as you walk through a door, such as a raised step or threshold.  Trim any bushes or trees on the path to your home.  Use bright outdoor lighting.  Clear any walking paths of anything that might make someone trip, such as rocks or tools.  Regularly check to see if handrails are loose or broken. Make sure that both sides of any steps have handrails.  Any raised decks and porches should have guardrails on the edges.  Have any leaves, snow, or ice cleared regularly.  Use sand or salt on walking paths during winter.  Clean up any spills in your garage right away. This includes oil or grease spills. What can I do in the bathroom?  Use night lights.  Install grab bars by the toilet and in the tub and shower. Do not use towel bars as grab bars.  Use non-skid mats or decals in the tub or shower.  If you need to sit down in the shower, use a plastic, non-slip stool.  Keep the floor dry. Clean up any water that spills on the floor as soon as it happens.  Remove soap buildup in the tub or shower regularly.  Attach bath mats securely with double-sided non-slip rug tape.  Do not have throw rugs and other things on the floor that can make you trip. What can I do in  the bedroom?  Use night lights.  Make sure that you have a light by your bed that is easy to reach.  Do not use any sheets or blankets that are too big for your bed. They should not hang down onto the floor.  Have a firm chair that has side arms. You can use this for support while you get dressed.  Do not have throw rugs and other things on the floor that can make you trip. What can I do in the kitchen?  Clean up any spills right away.  Avoid walking on wet floors.  Keep items that you use a lot in easy-to-reach places.  If you need to reach something above you, use a strong step stool that has a grab bar.  Keep electrical cords out of the way.  Do not use floor polish or wax that makes floors slippery. If you must use wax, use non-skid floor wax.  Do not have throw rugs and other things on the floor that can make you trip. What can I do with my stairs?  Do not leave any items on the stairs.  Make sure that  there are handrails on both sides of the stairs and use them. Fix handrails that are broken or loose. Make sure that handrails are as long as the stairways.  Check any carpeting to make sure that it is firmly attached to the stairs. Fix any carpet that is loose or worn.  Avoid having throw rugs at the top or bottom of the stairs. If you do have throw rugs, attach them to the floor with carpet tape.  Make sure that you have a light switch at the top of the stairs and the bottom of the stairs. If you do not have them, ask someone to add them for you. What else can I do to help prevent falls?  Wear shoes that:  Do not have high heels.  Have rubber bottoms.  Are comfortable and fit you well.  Are closed at the toe. Do not wear sandals.  If you use a stepladder:  Make sure that it is fully opened. Do not climb a closed stepladder.  Make sure that both sides of the stepladder are locked into place.  Ask someone to hold it for you, if possible.  Clearly mark and  make sure that you can see:  Any grab bars or handrails.  First and last steps.  Where the edge of each step is.  Use tools that help you move around (mobility aids) if they are needed. These include:  Canes.  Walkers.  Scooters.  Crutches.  Turn on the lights when you go into a dark area. Replace any light bulbs as soon as they burn out.  Set up your furniture so you have a clear path. Avoid moving your furniture around.  If any of your floors are uneven, fix them.  If there are any pets around you, be aware of where they are.  Review your medicines with your doctor. Some medicines can make you feel dizzy. This can increase your chance of falling. Ask your doctor what other things that you can do to help prevent falls. This information is not intended to replace advice given to you by your health care provider. Make sure you discuss any questions you have with your health care provider. Document Released: 10/25/2008 Document Revised: 06/06/2015 Document Reviewed: 02/02/2014 Elsevier Interactive Patient Education  2017 [employer].

## 2016-06-11 ENCOUNTER — Ambulatory Visit: Payer: Medicare Other

## 2016-06-11 ENCOUNTER — Other Ambulatory Visit: Payer: Self-pay | Admitting: Pharmacist

## 2016-06-11 ENCOUNTER — Other Ambulatory Visit: Payer: Medicare Other

## 2016-06-11 LAB — HEMOGLOBIN A1C
Hgb A1c MFr Bld: 9.3 % — ABNORMAL HIGH (ref <5.7)
Mean Plasma Glucose: 220 mg/dL

## 2016-06-11 NOTE — Patient Outreach (Addendum)
[facility]) Care Management  06/11/2016  Allison [name] [birth year] [id number]   Patient was called to follow up on pill packaging. HIPAA identifiers were obtained.  Patient reported going to pick up her medications but said they were not in bubble packing.  [facility] was called and they said they did put the patient's medications in pill packaging.  When asked what product they use, I was told QBUE Mini Weekly:     Patient said she was not satisfied with what she was given.   Plan:  I will conduct an acute home visit today to check on the patient and the pill pack.  Elayne Guerin, PharmD, BCACP [facility] Clinical Pharmacist [phone]  ADDENDUM Patient's schedule changed.  I will meet the patient at her provider's visit on Monday June 15, 2016 at [place], PharmD, [place] Clinical Pharmacist [phone]

## 2016-06-15 ENCOUNTER — Other Ambulatory Visit: Payer: Self-pay | Admitting: Pharmacist

## 2016-06-15 ENCOUNTER — Ambulatory Visit (INDEPENDENT_AMBULATORY_CARE_PROVIDER_SITE_OTHER): Payer: Medicare Other | Admitting: Pharmacotherapy

## 2016-06-15 ENCOUNTER — Encounter: Payer: Self-pay | Admitting: Pharmacotherapy

## 2016-06-15 VITALS — BP 154/80 | HR 79 | Temp 97.6°F | Ht 64.0 in | Wt 150.0 lb

## 2016-06-15 DIAGNOSIS — I63019 Cerebral infarction due to thrombosis of unspecified vertebral artery: Secondary | ICD-10-CM

## 2016-06-15 DIAGNOSIS — I10 Essential (primary) hypertension: Secondary | ICD-10-CM

## 2016-06-15 DIAGNOSIS — E1165 Type 2 diabetes mellitus with hyperglycemia: Secondary | ICD-10-CM

## 2016-06-15 DIAGNOSIS — Z794 Long term (current) use of insulin: Secondary | ICD-10-CM

## 2016-06-15 MED ORDER — AMLODIPINE BESYLATE 10 MG PO TABS: 10.0000 mg | ORAL_TABLET | Freq: Every day | ORAL | 3 refills | Status: DC

## 2016-06-15 NOTE — Patient Outreach (Signed)
[facility]) Care Management  [place]   06/15/2016  Allison [name] 09/11/1934 [id number]  Subjective: Met patient at her provider's office for medication review.  HIPAA identifiers were obtained.  Patient is a 82 year old female with multiple medical conditions including but not limited to:  Patient is a 82 year old female with multiple medical conditions including but not limited to:  PVD, history of stroke, type 2 diabetes with secondary neuropathy, hyperlipidemia, hypothyroidism, and GERD. Hospitalized 10/17-CVA, ED 3/28- UTI.  Patient was recently switched to pill packing with [facility].  Objective:  B/p 154/80  Encounter Medications: Outpatient Encounter Prescriptions as of 06/15/2016  Medication Sig Note  . atorvastatin (LIPITOR) 40 MG tablet Take 1 tablet (40 mg total) by mouth at bedtime.   . betamethasone dipropionate (DIPROLENE) 0.05 % cream Apply topically 2 (two) times daily.   . blood glucose meter kit and supplies KIT Check blood glucose twice daily before insulin therapy (ICD-10 E11.21, E11.65).   . chlorthalidone (HYGROTON) 25 MG tablet Take 0.5 tablets (12.5 mg total) by mouth daily.   . citalopram (CELEXA) 10 MG tablet Take 1 tablet (10 mg total) by mouth daily. For anxiety   . clopidogrel (PLAVIX) 75 MG tablet Take 1 tablet (75 mg total) by mouth daily.   Marland Kitchen gabapentin (NEURONTIN) 100 MG capsule Take 2 capsules (200 mg total) by mouth 3 (three) times daily as needed (nerve pain).   Marland Kitchen glucose blood (ONE TOUCH ULTRA TEST) test strip Use to test blood sugar three times daily. Dx: E11.21   . insulin aspart protamine- aspart (NOVOLOG MIX 70/30) (70-30) 100 UNIT/ML injection Inject 0.2 mLs (20 Units total) into the skin 2 (two) times daily with a meal.   . Insulin Pen Needle (FIFTY50 PEN NEEDLES) 32G X 4 MM MISC 1 each by Misc.(Non-Drug; Combo Route) route.   Marland Kitchen levothyroxine (SYNTHROID, LEVOTHROID) 150 MCG tablet take 1 tablet by mouth  every morning ON AN EMPTY STOMACH for THROID   . losartan (COZAAR) 100 MG tablet Take 1 tablet (100 mg total) by mouth daily.   . meclizine (ANTIVERT) 25 MG tablet Take 1 tablet (25 mg total) by mouth 3 (three) times daily as needed for dizziness. 06/15/2016: PRN  . metFORMIN (GLUCOPHAGE) 500 MG tablet take 2 tablets by mouth twice a day with food   . ONETOUCH DELICA LANCETS 04U MISC Use to test blood sugar three times daily. Dx: E11.21   . pantoprazole (PROTONIX) 40 MG tablet Take 1 tablet (40 mg total) by mouth daily.   . vitamin E (VITAMIN E) 1000 UNIT capsule Take 1,000 Units by mouth daily. 05/06/2016: Take a couple of times per week   No facility-administered encounter medications on file as of 06/15/2016.     Functional Status: In your present state of health, do you have any difficulty performing the following activities: 06/10/2016 05/14/2016  Hearing? N Y  Vision? N N  Difficulty concentrating or making decisions? N Y  Walking or climbing stairs? N Y  Dressing or bathing? N N  Doing errands, shopping? [name]  Preparing Food and eating ? N N  Using the Toilet? N N  In the past six months, have you accidently leaked urine? Y Y  Do you have problems with loss of bowel control? N N  Managing your Medications? Y Y  Managing your Finances? N N  Housekeeping or managing your Housekeeping? N N  Some recent data might be hidden  Fall/Depression Screening: Fall Risk  06/10/2016 06/03/2016 05/14/2016  Falls in the past year? No No Yes  Number falls in past yr: - - 2 or more  Injury with Fall? - - No  Risk Factor Category  - - High Fall Risk  Risk for fall due to : - - History of fall(s);Impaired balance/gait;Impaired mobility  Risk for fall due to (comments): - - -  Follow up - - Education provided;Falls prevention discussed   PHQ 2/9 Scores 06/10/2016 05/14/2016 05/14/2016 04/27/2016 01/23/2016 11/21/2015 10/08/2014  PHQ - 2 Score _0 0 0 0  PHQ- 9 Score _1 - - -       Assessment: Patient's medications were reviewed in the provider's office.    The following medications were in the patient's pill pack: Amlodipine 38m Citalopram 161mClopidogrel 7518mevothyroxine 150m41mosartan 100mg65mtoprazole  Following medications are still in bottles: Metformin 500mg 69mrthalidone 25mg A77mastatin 40mg Ga58mntin  Meclizine  Old medication bottles were separated for the patient.  Patient had not started using the pill packs but said she will start after it was explained how to use them.   Patient had not been taking amlodipine and it was not on her medication list.  Provider "restarted" amlodipine today and added it back to her medication list.   Plan:  1.  Follow up with the patient in 1 week.  2.  Call [facility] they have the patient's correct medication list.  [name], BCACP [place] Pharmacist [phone]:

## 2016-06-15 NOTE — Patient Instructions (Signed)
Take all medications as prescribed Stop drinking Pepsi - drink water. Exercise goal is 30-45 minutes 5 x week.

## 2016-06-15 NOTE — Progress Notes (Signed)
Subjective:    Allison [name] is a 82 y.o.African American female who presents for follow-up of Type 2 diabetes mellitus.   A1C: 9.3% (last A1C was 8.0%) She is here today with a [facility] pharmacist.  She has not been taking her amlodipine. Has been missing several doses of metformin.  Did not bring her blood glucose meter. Lowest BG: 160, Highest BG: 400 Admits to missing at least 1 dose of insulin. She knows her dose is 20 units twice daily.  She is trying to make healthy food choices. Her weakness is Pepsi. NO routine exercise. Wears glasses.  Denies problems with vision.  Eye exam is pending  No peripheral edema. Has dry skin. Denies problems with feet. Nocturia has improved - once per night. Has some urinary incontinence No dysuria Needs to drink more water.  TSH is too high due to missed RX LDL too high due to missed RX   Review of Systems A comprehensive review of systems was negative except for: Eyes: positive for contacts/glasses Genitourinary: positive for nocturia and urinary incontinence Integument/breast: positive for dryness    Objective:    BP (!) 154/80   Pulse 79   Temp 97.6 F (36.4 C) (Oral)   Ht 5\' 4"  (1.626 m)   Wt 150 lb (68 kg)   SpO2 97% Comment: room air  BMI 25.75 kg/m   General:  alert, cooperative and no distress  Oropharynx: normal findings: lips normal without lesions and gums healthy   Eyes:  negative findings: lids and lashes normal and conjunctivae and sclerae normal   Ears:  external ears normal        Lung: clear to auscultation bilaterally  Heart:  regular rate and rhythm     Extremities: no edema, redness or tenderness in the calves or thighs  Skin: dry     Neuro: mental status, speech normal, alert and oriented x3 and gait and station normal   Lab Review Glucose, Bld (mg/dL)  Date Value  06/10/2016 237 (H)  04/02/2016 180 (H)  02/15/2016 212 (H)   CO2 (mmol/L)  Date Value  06/10/2016 32 (H)  04/02/2016 28   02/15/2016 27   BUN (mg/dL)  Date Value  06/10/2016 11  04/02/2016 8  02/15/2016 5 (L)  05/22/2015 10  02/20/2015 12  10/19/2014 11   Creat (mg/dL)  Date Value  06/10/2016 0.91  02/13/2016 0.92  09/03/2015 0.80   Creatinine, Ser (mg/dL)  Date Value  04/02/2016 0.78  02/15/2016 0.80  02/15/2016 0.78       Assessment:    Diabetes Mellitus type II, under poor control. A1C too high due to missed medications and Pepsi. BP above goal due to missed RX.  Goal <130/80 LDL above goal due to missed RX.  Goal <70 TSH above target due to missed RX.   Plan:    1.  Rx changes: none  2.  Katina with [facility] assisted with this visit.  All of Ms. Gulley's medication will be now dispensed in pill packs to assist with compliance and accurate dosing.  Her current uncontrolled DM, HTN, thyroid disease, and dyslipidemia are all exacerbated by poor medication adherence. 3.  Continue Novolog Mix 70/30 insulin 20 units with breakfast and supper. 4.  Continue Metformin twice daily. 5.  Restart amlodipine 10mg  daily for hypertension. 6.  Expect thyroid to regulate with consistent RX administration. 7.  Expect LDL to approach goal with consistent RX administration. 8.  Counseled on nutrition goals.  Needs to avoid Pepsi  and drink more water. 9.  Counseled on need for routine exercise.  Goal is 30-45 minutes 5 x week.

## 2016-06-18 ENCOUNTER — Ambulatory Visit: Payer: Medicare Other | Admitting: Podiatry

## 2016-06-21 ENCOUNTER — Emergency Department (HOSPITAL_COMMUNITY)
Admission: EM | Admit: 2016-06-21 | Discharge: 2016-06-21 | Disposition: A | Discharge status: Home / Self Care | Payer: Medicare Other | Attending: Emergency Medicine | Admitting: Emergency Medicine

## 2016-06-21 ENCOUNTER — Emergency Department (HOSPITAL_COMMUNITY): Payer: Medicare Other

## 2016-06-21 ENCOUNTER — Encounter (HOSPITAL_COMMUNITY): Payer: Self-pay

## 2016-06-21 DIAGNOSIS — G5603 Carpal tunnel syndrome, bilateral upper limbs: Secondary | ICD-10-CM | POA: Diagnosis not present

## 2016-06-21 DIAGNOSIS — Z7902 Long term (current) use of antithrombotics/antiplatelets: Secondary | ICD-10-CM | POA: Diagnosis not present

## 2016-06-21 DIAGNOSIS — M79601 Pain in right arm: Secondary | ICD-10-CM

## 2016-06-21 DIAGNOSIS — Z794 Long term (current) use of insulin: Secondary | ICD-10-CM | POA: Diagnosis not present

## 2016-06-21 DIAGNOSIS — S59911A Unspecified injury of right forearm, initial encounter: Secondary | ICD-10-CM | POA: Diagnosis not present

## 2016-06-21 DIAGNOSIS — Y999 Unspecified external cause status: Secondary | ICD-10-CM | POA: Insufficient documentation

## 2016-06-21 DIAGNOSIS — W230XXA Caught, crushed, jammed, or pinched between moving objects, initial encounter: Secondary | ICD-10-CM | POA: Insufficient documentation

## 2016-06-21 DIAGNOSIS — E1165 Type 2 diabetes mellitus with hyperglycemia: Secondary | ICD-10-CM | POA: Insufficient documentation

## 2016-06-21 DIAGNOSIS — Y9389 Activity, other specified: Secondary | ICD-10-CM | POA: Insufficient documentation

## 2016-06-21 DIAGNOSIS — E039 Hypothyroidism, unspecified: Secondary | ICD-10-CM | POA: Insufficient documentation

## 2016-06-21 DIAGNOSIS — Y92410 Unspecified street and highway as the place of occurrence of the external cause: Secondary | ICD-10-CM | POA: Insufficient documentation

## 2016-06-21 DIAGNOSIS — Z79899 Other long term (current) drug therapy: Secondary | ICD-10-CM | POA: Diagnosis not present

## 2016-06-21 DIAGNOSIS — Z8673 Personal history of transient ischemic attack (TIA), and cerebral infarction without residual deficits: Secondary | ICD-10-CM | POA: Insufficient documentation

## 2016-06-21 DIAGNOSIS — I10 Essential (primary) hypertension: Secondary | ICD-10-CM | POA: Diagnosis not present

## 2016-06-21 DIAGNOSIS — M79631 Pain in right forearm: Secondary | ICD-10-CM | POA: Diagnosis not present
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
## 2016-06-21 NOTE — Discharge Instructions (Signed)
You have been seen today for an arm injury. There were no acute abnormalities on the x-rays, including no sign of fracture or dislocation. Pain: May take Tylenol as needed for pain.  Ice: May apply ice to the area over the next 24 hours for 15 minutes at a time to reduce swelling. Elevation: Keep the extremity elevated as often as possible to reduce pain and inflammation. Support: Wear the wrist splint for support and comfort. Wear this until pain resolves.  Exercises: Start by performing these exercises a few times a week, increasing the frequency until you are performing them twice daily.  Follow up: If symptoms are improving, you may follow up with your primary care provider for any continued management.

## 2016-06-21 NOTE — ED Triage Notes (Signed)
Pt complaining of R arm pain. Pt states shut arm in window. Pt with some decreased ROM to R wrist d/t pain. Pt with good cap refill. Some swelling and bruising to R forearm.

## 2016-06-21 NOTE — ED Notes (Signed)
Patient transported to X-ray 

## 2016-06-23 ENCOUNTER — Other Ambulatory Visit: Payer: Self-pay | Admitting: Pharmacist

## 2016-06-23 NOTE — Patient Outreach (Signed)
[place] [facility]) Care Management  06/23/2016  LATESIA NORRINGTON [birth year] [id number]   Patient was called to follow up on pill packing.  Unfortunately, she did not answer the phone.  A HIPAA compliant message was left on the patient's voicemail.  Patient visited the ED 06/21/16 for arm pan.  It was documented during her ED visit that she did not take her blood pressure medication that day.  Plan:  Patient will be recalled in 1 week to assess medication adherence.

## 2016-06-30 ENCOUNTER — Other Ambulatory Visit: Payer: Self-pay | Admitting: Internal Medicine

## 2016-06-30 ENCOUNTER — Other Ambulatory Visit: Payer: Self-pay | Admitting: Pharmacist

## 2016-06-30 NOTE — Patient Outreach (Signed)
[place] [facility]) Care Management  06/30/2016  Allison [name] Jul 11, 1934 [id number]   Patient was called to follow up on pill packing. Unfortunately, the patient did not answer and the phone.  The phone rang more than 30 times and the voicemail did not pick up.    Plan:  Call patient back in 10-14 days.   Elayne Guerin, PharmD, [place] Clinical Pharmacist [phone]

## 2016-07-14 ENCOUNTER — Encounter: Payer: Self-pay | Admitting: Pharmacist

## 2016-07-14 ENCOUNTER — Other Ambulatory Visit: Payer: Self-pay | Admitting: Pharmacist

## 2016-07-14 NOTE — Patient Outreach (Signed)
[place] [facility]) Care Management  07/14/2016  Allison [name] May 01, 1934 [id number]   Patient was called to follow up on medication adherence. HIPAA identifiers were obtained.  Patient reported she is taking her medications and managing her pill packs well. Patient has been very difficult to reach. She reported being out of town quite a bit with family. Patient's busy schedule has made scheduling home visits very difficult.   Patient reported she feels "fine" and knows she can call with any medication related questions or concerns.   Plan:  Since patient reports managing her medications with pill packing, pharmacy case will be closed.  Other disciplines have already closed.  Elayne Guerin, PharmD, [place] Clinical Pharmacist [phone]

## 2016-07-16 ENCOUNTER — Ambulatory Visit (INDEPENDENT_AMBULATORY_CARE_PROVIDER_SITE_OTHER): Payer: Medicare Other | Admitting: Podiatry

## 2016-07-16 ENCOUNTER — Encounter: Payer: Self-pay | Admitting: Podiatry

## 2016-07-16 DIAGNOSIS — M79671 Pain in right foot: Secondary | ICD-10-CM

## 2016-07-16 DIAGNOSIS — M79672 Pain in left foot: Secondary | ICD-10-CM | POA: Diagnosis not present

## 2016-07-16 DIAGNOSIS — B351 Tinea unguium: Secondary | ICD-10-CM | POA: Diagnosis not present

## 2016-07-16 NOTE — Progress Notes (Signed)
Subjective: 82 year old female presents complaining of painful toe nails. All toes are hurting when put on closed in shoes.   HPI: Had a stroke in October 2017. Complains no after effect. Her last blood glucose reading was 212.   Objective: Thick dystrophic nails x 10 with yellow fungal debris under nail plate of both great toes.  Pedal pulses are not palpable.  No edema or erythema noted. No abnormal skin lesions noted.  Dry peeling skin posterior right heel.  Assessment: Mycotic nails bilateral. Diabetic Neuropathy. PVD.  Plan: Debrided all nails.  Advised to get a new glucose reading machine. If the reading continue to be in 200 level advised to get back to her PCP. Return in 3 months or as needed.

## 2016-07-16 NOTE — Patient Instructions (Signed)
Seen for hypertrophic nails. All nails debrided. Return in 3 months or as needed.  

## 2016-07-29 ENCOUNTER — Other Ambulatory Visit: Payer: Self-pay | Admitting: *Deleted

## 2016-07-29 MED ORDER — GABAPENTIN 100 MG PO CAPS: 200.0000 mg | ORAL_CAPSULE | Freq: Three times a day (TID) | ORAL | 3 refills | Status: DC | PRN

## 2016-07-29 NOTE — Telephone Encounter (Signed)
Allison [name] with [facility] called and stated that they do Pill pack for patient and the Rx called in for Gabapentin was only for #90 and she takes 2tid. Needs #180 to last her a month and to place in blister packs. New Rx faxed to pharmacy.

## 2016-08-10 ENCOUNTER — Ambulatory Visit: Payer: Medicare Other | Admitting: Pharmacotherapy

## 2016-08-17 ENCOUNTER — Telehealth: Payer: Self-pay | Admitting: *Deleted

## 2016-08-17 ENCOUNTER — Ambulatory Visit: Payer: Medicare Other | Admitting: Pharmacotherapy

## 2016-08-17 NOTE — Telephone Encounter (Signed)
Patient walked into office and requested a Handicap Placard form to be filled out because she lost her last one. Form filled out and placed in Dr. [place] folder with last OV note. To call [phone] once ready for pick up

## 2016-08-18 NOTE — Telephone Encounter (Signed)
Called pt, left message,  Handicap Placard  form ready for pick up at front desk...[facility]

## 2016-08-19 ENCOUNTER — Other Ambulatory Visit: Payer: Self-pay | Admitting: *Deleted

## 2016-08-19 MED ORDER — LOSARTAN POTASSIUM 100 MG PO TABS: 100.0000 mg | ORAL_TABLET | Freq: Every day | ORAL | 1 refills | Status: DC

## 2016-08-19 NOTE — Telephone Encounter (Signed)
[facility] 

## 2016-09-17 ENCOUNTER — Other Ambulatory Visit: Payer: Self-pay | Admitting: Internal Medicine

## 2016-09-17 ENCOUNTER — Other Ambulatory Visit: Payer: Medicare Other

## 2016-09-17 DIAGNOSIS — E034 Atrophy of thyroid (acquired): Secondary | ICD-10-CM

## 2016-09-17 DIAGNOSIS — E1165 Type 2 diabetes mellitus with hyperglycemia: Secondary | ICD-10-CM | POA: Diagnosis not present

## 2016-09-17 DIAGNOSIS — K219 Gastro-esophageal reflux disease without esophagitis: Secondary | ICD-10-CM

## 2016-09-17 DIAGNOSIS — Z794 Long term (current) use of insulin: Secondary | ICD-10-CM | POA: Diagnosis not present

## 2016-09-18 LAB — COMPLETE METABOLIC PANEL WITH GFR
AG Ratio: 1.5 (calc) (ref 1.0–2.5)
ALT: 12 U/L (ref 6–29)
AST: 13 U/L (ref 10–35)
Albumin: 4.2 g/dL (ref 3.6–5.1)
Alkaline phosphatase (APISO): 69 U/L (ref 33–130)
BUN: 9 mg/dL (ref 7–25)
CO2: 29 mmol/L (ref 20–32)
Calcium: 9.8 mg/dL (ref 8.6–10.4)
Chloride: 100 mmol/L (ref 98–110)
Creat: 0.78 mg/dL (ref 0.60–0.93)
GFR, Est African American: 84 mL/min/{1.73_m2} (ref >=60)
GFR, Est Non African American: 72 mL/min/{1.73_m2} (ref >=60)
Globulin: 2.8 g/dL (calc) (ref 1.9–3.7)
Glucose, Bld: 263 mg/dL — ABNORMAL HIGH (ref 65–99)
Potassium: 4.3 mmol/L (ref 3.5–5.3)
Sodium: 138 mmol/L (ref 135–146)
Total Bilirubin: 0.7 mg/dL (ref 0.2–1.2)
Total Protein: 7 g/dL (ref 6.1–8.1)

## 2016-09-18 LAB — TSH: TSH: 14.56 mIU/L — ABNORMAL HIGH (ref 0.40–4.50)

## 2016-09-18 LAB — HEMOGLOBIN A1C
Hgb A1c MFr Bld: 11.3 % of total Hgb — ABNORMAL HIGH (ref <5.7)
Mean Plasma Glucose: 278 (calc)
eAG (mmol/L): 15.4 (calc)

## 2016-09-21 ENCOUNTER — Ambulatory Visit (INDEPENDENT_AMBULATORY_CARE_PROVIDER_SITE_OTHER): Payer: Medicare Other | Admitting: Pharmacotherapy

## 2016-09-21 ENCOUNTER — Encounter: Payer: Self-pay | Admitting: Pharmacotherapy

## 2016-09-21 VITALS — BP 162/84 | HR 85 | Temp 97.8°F | Resp 18 | Ht 64.0 in | Wt 152.4 lb

## 2016-09-21 DIAGNOSIS — E1165 Type 2 diabetes mellitus with hyperglycemia: Secondary | ICD-10-CM | POA: Diagnosis not present

## 2016-09-21 DIAGNOSIS — Z794 Long term (current) use of insulin: Secondary | ICD-10-CM

## 2016-09-21 DIAGNOSIS — Z91148 Patient's other noncompliance with medication regimen for other reason: Secondary | ICD-10-CM

## 2016-09-21 DIAGNOSIS — I63019 Cerebral infarction due to thrombosis of unspecified vertebral artery: Secondary | ICD-10-CM | POA: Diagnosis not present

## 2016-09-21 DIAGNOSIS — I10 Essential (primary) hypertension: Secondary | ICD-10-CM | POA: Diagnosis not present

## 2016-09-21 DIAGNOSIS — Z9114 Patient's other noncompliance with medication regimen: Secondary | ICD-10-CM | POA: Diagnosis not present

## 2016-09-21 NOTE — Progress Notes (Signed)
Subjective:    Allison [name] is a 82 y.o.African American female who presents for follow-up of Type 2 diabetes mellitus.   A1C much worse at 11.3% (was 9.3%) BP is much worse  TSH is much worse All her medical problems are exacerbated by medication non-adherence. [facility] closed her file in July after she reported to them via phone that she was taking her medications as prepared in the pill packs.  Today she is unable to tell us what she is actually taking.  She cannot be sure if she is taking her insulin at 20 or 22 units.  She did not take it this morning. She says she SMBG twice a day, but did not bring her meter.  However, when asked, she has not checked her BG this morning. When asked what her values for BG are "I forgot now"  She says "I take all my pills except gabapentin"  Refuses to take gabapentin "it makes me so drunk". She does admit to missing "a day or two" per week of her medications. Her son tries to follow up with her, but she sends him away.   Has peripheral neuropathy in her hands and feet. 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  Next eye exam scheduled in October. Some peripheral edema. Sees a podiatrist for toenail care. Skin is very dry. Nocturia all night long - frequent incontinence. No dysuria Has polydipsia.   Drinking a lot of Pepsi. Struggles with healthy eating. No routine exercise.  She lives with her sister and son. When looking at her pill pack, she has missed 5 days out of 7 in the last week.  Review of Systems A comprehensive review of systems was negative except for: Constitutional: positive for fatigue Eyes: positive for contacts/glasses and visual disturbance Cardiovascular: positive for lower extremity edema Genitourinary: positive for nocturia and urinary incontinence Integument/breast: positive for dryness Endocrine: positive for diabetic symptoms including blurry vision, increased fatigue, polydipsia, polyphagia, polyuria, pruritus and skin dryness     Objective:    BP (!) 162/84   Pulse 85   Temp 97.8 F (36.6 C) (Oral)   Resp 18   Ht 5\' 4"  (1.626 m)   Wt 152 lb 6.4 oz (69.1 kg)   SpO2 97%   BMI 26.16 kg/m   General:  alert, cooperative and no distress  Oropharynx: normal findings: lips normal without lesions and gums healthy   Eyes:  negative findings: lids and lashes normal and conjunctivae and sclerae normal   Ears:  external ears normal        Lung: clear to auscultation bilaterally  Heart:  regular rate and rhythm     Extremities: edema bilateral lower extremities  Skin: dry     Neuro: mental status, speech normal, alert and oriented x3 and gait and station normal   Lab Review Glucose, Bld (mg/dL)  Date Value  09/17/2016 263 (H)  06/10/2016 237 (H)  04/02/2016 180 (H)   CO2 (mmol/L)  Date Value  09/17/2016 29  06/10/2016 32 (H)  04/02/2016 28   BUN (mg/dL)  Date Value  09/17/2016 9  06/10/2016 11  04/02/2016 8  05/22/2015 10  02/20/2015 12  10/19/2014 11   Creat (mg/dL)  Date Value  09/17/2016 0.78  06/10/2016 0.91  02/13/2016 0.92   Creatinine, Ser (mg/dL)  Date Value  04/02/2016 0.78  02/15/2016 0.80  02/15/2016 0.78       Assessment:    Diabetes Mellitus type II, under poor control. A1C is above goal due to not  taking medication. BP above goal <130/80 due to not taking medication. TSH above therapeutic due to not taking medication.   Plan:    1.  Rx changes: MUST start taking medications as prescribed.  2.  Long discussion on health risks - CV, renal failure, amputations, blindness - that can all be assured with her continued non-adherence to medication. 3.  Will ask [facility] to reopen her case.   4.  Take 70/30 insulin 20 units twice daily. 5.  Continue Metformin 1000mg  twice daily. 6.  BP above target <130/80 due to non-adherence.  Needs to take amlodipine and losartan as prescribed. 7.  TSH >14 due to non-adherence with medication.

## 2016-09-21 NOTE — Patient Instructions (Signed)
TAKE YOUR MEDICATION AS PRESCRIBED

## 2016-09-22 ENCOUNTER — Other Ambulatory Visit: Payer: Self-pay

## 2016-09-22 NOTE — Patient Outreach (Signed)
[place] [facility]) Care Management  09/22/2016  Allison [name] June 16, 1934 [id number]   Telephone Screen  Referral Date: 09/22/16 Referral Source: MD office Referral Reason: "uncontrolled DM, HTN, non adherence to medications" Insurance: Medicare   Outreach attempt # 1 to patient. No answer. RN CM left HIPAA compliant voicemail message along with contact info.     Plan: RN CM will make outreach attempt to patient within one week if no return call from patient.    Enzo Montgomery, RN,BSN,CCM [place] Management Telephonic Care Management Coordinator Direct Phone: [phone] Toll Free: [phone] Fax: [phone]

## 2016-09-25 ENCOUNTER — Other Ambulatory Visit: Payer: Self-pay

## 2016-09-25 NOTE — Patient Outreach (Signed)
[place] [facility]) Care Management  09/25/2016  Allison [name] 03/17/1934 [id number]   Telephone Screen  Referral Date: 09/22/16 Referral Source: MD office Referral Reason: "uncontrolled DM, HTN, non adherence to medications" Insurance: Medicare    Outreach attempt #2 to patient. No answer at present. RN CM left HIPAA compliant voicemail message along with contact info.    Plan: RN CM will make outreach attempt to patient within one week if no return call from patient.   Enzo Montgomery, RN,BSN,CCM [place] Management Telephonic Care Management Coordinator Direct Phone: [phone] Toll Free: [phone]: [phone]

## 2016-09-30 ENCOUNTER — Other Ambulatory Visit: Payer: Self-pay

## 2016-09-30 NOTE — Progress Notes (Addendum)
[facility]  PATIENT: Allison [name] DOB: May 14, 1934   REASON FOR VISIT: follow-up for stroke HISTORY FROM: Patient    HISTORY OF PRESENT ILLNESS:UPDATE 09/20/2018CM AllisonDiaz, 82 year old female returns for follow-up for stroke event which occurred in October 2017. She is currently on Plavix for secondary stroke prevention with minimal bruising and no  Bleeding. She has not had further stroke or TIA symptoms .She remains on Lipitor for hyper lipidemia without  Myalgias. When last seen by [facility] pharmacist she was not taking her blood pressure medicine thyroid medication or her diabetes medication. Her most recent hemoglobin A1c on 09/17/2016 was 11.3. Patient says she is now compliant with her medications. She gets little exercise was encouraged to do so. She wants to join [employer]. She denies any recent falls no balance issues. She does not use an assistive device.    UPDATE 03/27/2016 [name], 82 year old female returns for follow-up for stroke which occurred in October 2017. She is currently on Plavix for secondary stroke prevention without further stroke or TIA symptoms. She has minimal bruising and no bleeding.Her most recent hemoglobin A1c in 03-11-2022 was 8.0 down from 11.2 in October. She remains on Lipitor for hyperlipidemia and she denies any muscle aches.She is getting little exercise and she is not doing her home exercise program provided by physical therapy. She denies any swallowing difficulty,  speech difficulty,weakness or visual changes.She denies any falls she does not use an assistive device. She returns for reevaluation  HISTORY 11/21/17CM28 year old female with a history of diabetes mellitus, hyperlipidemia, hypertension, hypothyroidism, medical noncompliance presented with sudden onset of headache and visual hallucinations on 10/28/2015. In addition, the patient's sister states that the patient has had some slurred speech and gait  instability for the past week. Sister states that the patient has been "tripping over her right foot". The patient reports seeing her mother in the kitchen. [name] died in 2009/03/11. The hallucinations persisted. As a result, the sister decided to bring the patient to the emergency department. In the emergency department, the patient was afebrile and hemodynamically stable. CT of the brain showed possible right internal capsule infarct involving the right caudate nucleus. She was admitted to the stroke service. MRI right internal capsule infarct. MRA no emergent large vessel occlusion or severe stenosis right proximal PCA right VA. Moderate stenosis BA left ICA left MCA left ACA. Carotid Doppler no significant stenosis EEG no seizure activity. LDL 213 hemoglobin A1c 11.2.  2-D echo EF 55-60%. She was placed on Plavix from aspirin. She returns to the stroke clinic today for follow-up. She remains on Plavix for secondary stroke prevention and has not had further stroke or TIA symptoms in addition she's had some adjustments to her insulin. She remains on Lipitor 40 mg daily. She received some outpatient physical therapy but that has concluded. She was encouraged to do home exercise program and to be more active. She denies any weakness visual changes speech or swallowing difficulty or increased confusion since discharge. She returns for reevaluation   REVIEW OF SYSTEMS: Full 14 system review of systems performed and notable only for those listed, all others are neg:  Constitutional: Fatigue Cardiovascular: neg Ear/Nose/Throat: neg  Skin: neg Eyes: neg Respiratory: neg Gastroitestinal: neg  Hematology/Lymphatic: neg  Endocrine: neg Musculoskeletal:neg Allergy/Immunology: neg Neurological: neg Psychiatric: neg Sleep : neg   ALLERGIES: Allergies  Allergen Reactions  . Lantus [Insulin Glargine] Itching and Swelling    angioedema  . Levemir [Insulin Detemir] Itching and Swelling  angioedema    . Pineapple Anaphylaxis  . Codeine Other (See Comments)    unknown  . Invokana [Canagliflozin] Other (See Comments)    Constipation, nightmares  . Penicillins Other (See Comments)    Unknown   . Tradjenta [Linagliptin] Other (See Comments)  . Vioxx [Rofecoxib] Other (See Comments)    unknown    HOME MEDICATIONS: Outpatient Medications Prior to Visit  Medication Sig Dispense Refill  . amLODipine (NORVASC) 10 MG tablet Take 1 tablet by mouth daily for blood pressure in the morning 30 tablet 3  . atorvastatin (LIPITOR) 40 MG tablet Take 1 tablet (40 mg total) by mouth at bedtime. 30 tablet 3  . betamethasone dipropionate (DIPROLENE) 0.05 % cream Apply topically 2 (two) times daily.    . blood glucose meter kit and supplies KIT Check blood glucose twice daily before insulin therapy (ICD-10 E11.21, E11.65). 1 each 0  . chlorthalidone (HYGROTON) 25 MG tablet Take 0.5 tablets (12.5 mg total) by mouth daily. 30 tablet 3  . citalopram (CELEXA) 10 MG tablet Take 1 tablet (10 mg total) by mouth daily. For anxiety in the morning 30 tablet 3  . clopidogrel (PLAVIX) 75 MG tablet Take 1 tablet (75 mg total) by mouth daily in the morning 30 tablet 0  . gabapentin (NEURONTIN) 100 MG capsule Take 2 capsules (200 mg total) by mouth 3 (three) times daily as needed (nerve pain). 180 capsule 3  . glucose blood (ONE TOUCH ULTRA TEST) test strip Use to test blood sugar three times daily. Dx: E11.21 100 each 11  . insulin aspart protamine- aspart (NOVOLOG MIX 70/30) (70-30) 100 UNIT/ML injection Inject 0.2 mLs (20 Units total) into the skin 2 (two) times daily with a meal. 10 mL 11  . Insulin Pen Needle (FIFTY50 PEN NEEDLES) 32G X 4 MM MISC 1 each by Misc.(Non-Drug; Combo Route) route.    Marland Kitchen levothyroxine (SYNTHROID, LEVOTHROID) 150 MCG tablet take 1 tablet by mouth every morning ON AN EMPTY STOMACH for THROID in the morning 30 tablet 3  . losartan (COZAAR) 100 MG tablet Take 1 tablet (100 mg total) by mouth  daily in the morning 30 tablet 3  . meclizine (ANTIVERT) 25 MG tablet Take 1 tablet (25 mg total) by mouth 3 (three) times daily as needed for dizziness. 30 tablet 1  . metFORMIN (GLUCOPHAGE) 500 MG tablet take 2 tablets by mouth twice a day with food 180 tablet 3  . ONETOUCH DELICA LANCETS 41L MISC Use to test blood sugar three times daily. Dx: E11.21 100 each 6  . pantoprazole (PROTONIX) 40 MG tablet Take 1 tablet (40 mg total) by mouth daily in the morning 30 tablet 3  . vitamin E (VITAMIN E) 1000 UNIT capsule Take 1,000 Units by mouth daily.     No facility-administered medications prior to visit.     PAST MEDICAL HISTORY: Past Medical History:  Diagnosis Date  . Carpal tunnel syndrome   . Diabetes mellitus without complication ([place])   . Dysphagia   . GERD (gastroesophageal reflux disease)   . Hiatal hernia   . HLD (hyperlipidemia)   . Hx of adenomatous colonic polyps 06/19/2014  . Hypertension   . Hypothyroidism   . Onychomycosis 03/05/2012  . Pain in lower limb 04/18/2013  . Sciatic nerve pain   . Vertigo     PAST SURGICAL HISTORY: Past Surgical History:  Procedure Laterality Date  . BLADDER SURGERY     Mesh implant  . COLONOSCOPY    .  LARYNX SURGERY     vocal cords  . PARTIAL HYSTERECTOMY  1978  . VESICOVAGINAL FISTULA CLOSURE W/ TAH  1976    FAMILY HISTORY: Family History  Problem Relation Age of Onset  . Alzheimer's disease Mother   . Glaucoma Mother   . Heart disease Father   . Alzheimer's disease Father   . Asthma Sister   . Allergies Sister   . Allergies Sister   . Sleep apnea Sister   . Heart disease Sister   . Breast cancer Maternal Aunt   . Pancreatic cancer Maternal Aunt   . Prostate cancer Maternal Uncle   . Prostate cancer Paternal Uncle   . Alcoholism Maternal Uncle   . Kidney disease Maternal Aunt   . Heart disease Maternal Aunt     SOCIAL HISTORY: Social History   Social History  . Marital status: Single    Spouse name: N/A  . Number  of children: 2  . Years of education: N/A   Occupational History  . Retired    Social History Main Topics  . Smoking status: Never Smoker  . Smokeless tobacco: Never Used  . Alcohol use No  . Drug use: No  . Sexual activity: No   Other Topics Concern  . Not on file   Social History Narrative   Single   2 sons   Retired   2 cups caffeine daily   05/01/2014        PHYSICAL EXAM  Vitals:   10/01/16 1235  BP: 135/79  Pulse: 82  Weight: 155 lb 6.4 oz (70.5 kg)  Height: _0  (1.626 m)   Body mass index is 26.67 kg/m.  Generalized: Well developed, in no acute distress  Head: normocephalic and atraumatic,. Oropharynx benign  Neck: Supple, no carotid bruits  Cardiac: Regular rate rhythm, no murmur  Musculoskeletal: No deformity   Neurological examination   Mentation: Alert oriented to time, place, history taking. Attention span and concentration appropriate. Recent and remote memory intact.  Follows all commands speech and language fluent.   Cranial nerve II-XII: .Pupils were equal round reactive to light extraocular movements were full, visual field were full on confrontational test. Facial sensation and strength were normal. hearing was intact to finger rubbing bilaterally. Uvula tongue midline. head turning and shoulder shrug were normal and symmetric.Tongue protrusion into cheek strength was normal. Motor: normal bulk and tone, full strength in the BUE, BLE, fine finger movements normal, no pronator drift.  Sensory: normal and symmetric to light touch, pinprick, and  Vibration, in the upper and lower extremities Coordination: finger-nose-finger, heel-to-shin bilaterally, no dysmetria, no tremor Reflexes: 1+ upper lower and symmetric, plantar responses were flexor bilaterally. Gait and Station: Rising up from seated position without assistance, normal stance,  moderate stride, good arm swing, smooth turning, able to perform tiptoe, and heel walking without difficulty.  Tandem gait is steady. No assistive device  DIAGNOSTIC DATA (LABS, IMAGING, TESTING) - I reviewed patient records, labs, notes, testing and imaging myself where available.  Lab Results  Component Value Date   WBC 7.5 04/02/2016   HGB 13.5 04/02/2016   HCT 40.3 04/02/2016   MCV 80.4 04/02/2016   PLT 236 04/02/2016      Component Value Date/Time   NA 138 09/17/2016 1129   NA 139 05/22/2015 0845   K 4.3 09/17/2016 1129   CL 100 09/17/2016 1129   CO2 29 09/17/2016 1129   GLUCOSE 263 (H) 09/17/2016 1129   BUN 9 09/17/2016 1129  BUN 10 05/22/2015 0845   CREATININE 0.78 09/17/2016 1129   CALCIUM 9.8 09/17/2016 1129   CALCIUM 10.7 (H) 08/05/2013 2053   PROT 7.0 09/17/2016 1129   PROT 7.0 05/22/2015 0845   ALBUMIN 4.0 06/10/2016 0907   ALBUMIN 4.0 05/22/2015 0845   AST 13 09/17/2016 1129   ALT 12 09/17/2016 1129   ALKPHOS 69 06/10/2016 0907   BILITOT 0.7 09/17/2016 1129   BILITOT 0.5 05/22/2015 0845   GFRNONAA 72 09/17/2016 1129   GFRAA 84 09/17/2016 1129   Lab Results  Component Value Date   CHOL 236 (H) 06/10/2016   HDL 53 06/10/2016   LDLCALC 165 (H) 06/10/2016   TRIG 89 06/10/2016   CHOLHDL 4.5 06/10/2016   Lab Results  Component Value Date   HGBA1C 11.3 (H) 09/17/2016   Lab Results  Component Value Date   VITAMINB12 630 10/28/2015   Lab Results  Component Value Date   TSH 14.56 (H) 09/17/2016      ASSESSMENT AND PLAN  82 y.o. year old female  has a past medical history of  diabetes mellitus, hyperlipidemia, hypertension,  medical noncompliance presented with sudden onset of headache and visual hallucinations on 10/28/2015. In addition, the patient's sister states that the patient has had some slurred speech and gait instability for the past week.MRI  Of the brain right internal capsule infarct. She returns for  follow-up.The patient is a current patient of Dr. Leonie Man  who is out of the office today . This note is sent to the work in doctor.       PLAN:  Stressed the importance of management of risk factors to prevent further stroke Continue Plavix for secondary stroke prevention Maintain strict control of hypertension with blood pressure goal below 130/90, today's reading 135/79  Control of diabetes with hemoglobin A1c below 6.5 followed by primary care most recent hemoglobin A1c11.3 on 9/6  continue diabetic medications and be compliant Cholesterol with LDL cholesterol less than 70, followed by primary care,  continue Lipitor Exercise by walking,  eat healthy diet with whole grains,  fresh fruits and vegetables ,  Join Silver Sneakers or walk in the neighborhood Will discharge from stroke clinic Continue follow up with PCP for stroke risk management, B/P, DM etc I spent 25 min in total face to face time with the patient more than 50% of which was spent counseling and coordination of care, reviewing test results reviewing medications and discussing and reviewing the diagnosis of stroke and the importance of staying in compliance with her plan of care. Given her written information on stroke prevention Dennie Bible, [facility], [facility], APRN  [facility] [address] [place], [place] [address] [phone]  I reviewed the above note and documentation by the Nurse Practitioner and agree with the history, physical exam, assessment and plan as outlined above. I was immediately available for face-to-face consultation. Star Age, MD, PhD [facility])

## 2016-09-30 NOTE — Patient Outreach (Signed)
[facility]) Care Management  09/30/2016  Allison [name] [birth year] [id number]   Telephone Screen  Referral Date: 09/22/16 Referral Source: MD office Referral Reason: "uncontrolled DM, HTN, non adherence to medications" Insurance: Medicare    Outreach attempt #3 to patient. No answer. RN CM unable to leave voicemail message as mailbox full.      Plan: RN CM will send unsuccessful outreach letter and close case if no response within 10 business days.    Enzo Montgomery, RN,BSN,CCM [facility] Management Telephonic Care Management Coordinator Direct Phone: [phone] Toll Free: [phone] Fax: [phone]

## 2016-10-01 ENCOUNTER — Encounter: Payer: Self-pay | Admitting: Nurse Practitioner

## 2016-10-01 ENCOUNTER — Ambulatory Visit (INDEPENDENT_AMBULATORY_CARE_PROVIDER_SITE_OTHER): Payer: Medicare Other | Admitting: Nurse Practitioner

## 2016-10-01 VITALS — BP 135/79 | HR 82 | Ht 64.0 in | Wt 155.4 lb

## 2016-10-01 DIAGNOSIS — E785 Hyperlipidemia, unspecified: Secondary | ICD-10-CM | POA: Diagnosis not present

## 2016-10-01 DIAGNOSIS — E1121 Type 2 diabetes mellitus with diabetic nephropathy: Secondary | ICD-10-CM

## 2016-10-01 DIAGNOSIS — I10 Essential (primary) hypertension: Secondary | ICD-10-CM

## 2016-10-01 DIAGNOSIS — Z8673 Personal history of transient ischemic attack (TIA), and cerebral infarction without residual deficits: Secondary | ICD-10-CM | POA: Diagnosis not present

## 2016-10-01 DIAGNOSIS — I63019 Cerebral infarction due to thrombosis of unspecified vertebral artery: Secondary | ICD-10-CM

## 2016-10-01 DIAGNOSIS — E1165 Type 2 diabetes mellitus with hyperglycemia: Secondary | ICD-10-CM | POA: Diagnosis not present

## 2016-10-01 NOTE — Patient Instructions (Addendum)
Stressed the importance of management of risk factors to prevent further stroke Continue Plavix for secondary stroke prevention Maintain strict control of hypertension with blood pressure goal below 130/90, today's reading 135/79  Control of diabetes with hemoglobin A1c below 6.5 followed by primary care most recent hemoglobin A1c11.3 on 9/6  continue diabetic medications and be compliant Cholesterol with LDL cholesterol less than 70, followed by primary care,  continue Lipitor Exercise by walking,  eat healthy diet with whole grains,  fresh fruits and vegetables ,  Join Silver Sneakers or walk in the neighborhood Will discharge from stroke clinic  Stroke Prevention Some health problems and behaviors may make it more likely for you to have a stroke. Below are ways to lessen your risk of having a stroke.  Be active for at least 30 minutes on most or all days.  Do not smoke. Try not to be around others who smoke.  Do not drink too much alcohol. ? Do not have more than 2 drinks a day if you are a man. ? Do not have more than 1 drink a day if you are a woman and are not pregnant.  Eat healthy foods, such as fruits and vegetables. If you were put on a specific diet, follow the diet as told.  Keep your cholesterol levels under control through diet and medicines. Look for foods that are low in saturated fat, trans fat, cholesterol, and are high in fiber.  If you have diabetes, follow all diet plans and take your medicine as told.  Ask your doctor if you need treatment to lower your blood pressure. If you have high blood pressure (hypertension), follow all diet plans and take your medicine as told by your doctor.  If you are 47-58 years old, have your blood pressure checked every 3-5 years. If you are age 25 or older, have your blood pressure checked every year.  Keep a healthy weight. Eat foods that are low in calories, salt, saturated fat, trans fat, and cholesterol.  Do not take  drugs.  Avoid birth control pills, if this applies. Talk to your doctor about the risks of taking birth control pills.  Talk to your doctor if you have sleep problems (sleep apnea).  Take all medicine as told by your doctor. ? You may be told to take aspirin or blood thinner medicine. Take this medicine as told by your doctor. ? Understand your medicine instructions.  Make sure any other conditions you have are being taken care of.  Get help right away if:  You suddenly lose feeling (you feel numb) or have weakness in your face, arm, or leg.  Your face or eyelid hangs down to one side.  You suddenly feel confused.  You have trouble talking (aphasia) or understanding what people are saying.  You suddenly have trouble seeing in one or both eyes.  You suddenly have trouble walking.  You are dizzy.  You lose your balance or your movements are clumsy (uncoordinated).  You suddenly have a very bad headache and you do not know the cause.  You have new chest pain.  Your heart feels like it is fluttering or skipping a beat (irregular heartbeat). Do not wait to see if the symptoms above go away. Get help right away. Call your local emergency services (911 in [place] Do not drive yourself to the hospital. This information is not intended to replace advice given to you by your health care provider. Make sure you discuss any questions you have  with your health care provider. Document Released: 06/30/2011 Document Revised: 06/06/2015 Document Reviewed: 07/01/2012 Elsevier Interactive Patient Education  [employer].

## 2016-10-02 ENCOUNTER — Telehealth: Payer: Self-pay

## 2016-10-02 ENCOUNTER — Ambulatory Visit: Payer: Medicare Other | Admitting: Nurse Practitioner

## 2016-10-02 MED ORDER — GLUCOSE BLOOD VI STRP: ORAL_STRIP | 11 refills | Status: DC

## 2016-10-02 NOTE — Telephone Encounter (Signed)
The pharmacy called requesting a rx for Onetouch Verio test strips, we had on file that patient is using a onetouch Ultra meter.  I will call patient to clarify  Spoke with patient, patient now has a Civil engineer, contracting, test strips sent to pharmacy

## 2016-10-13 ENCOUNTER — Other Ambulatory Visit: Payer: Self-pay | Admitting: Internal Medicine

## 2016-10-13 DIAGNOSIS — E034 Atrophy of thyroid (acquired): Secondary | ICD-10-CM

## 2016-10-13 DIAGNOSIS — K219 Gastro-esophageal reflux disease without esophagitis: Secondary | ICD-10-CM

## 2016-10-14 ENCOUNTER — Other Ambulatory Visit: Payer: Self-pay

## 2016-10-14 NOTE — Patient Outreach (Signed)
[facility]) Care Management  10/14/2016  Allison [name] 04-16-1934 [id number]   Telephone Screen  Referral Date: 09/22/16 Referral Source: MD office Referral Reason: "uncontrolled DM, HTN, non adherence to medications" Insurance: Medicare    Multiple attempts to establish contact with patient without success. No response from letter mailed to patient. Case is being closed at this time.     Plan: RN CM will notify [facility] administrative assistant of case status. RN CM will send MD case closure letter.    Enzo Montgomery, RN,BSN,CCM [facility] Management Telephonic Care Management Coordinator Direct Phone: [phone] Toll Free: [phone] Fax: [phone]

## 2016-10-15 ENCOUNTER — Ambulatory Visit (INDEPENDENT_AMBULATORY_CARE_PROVIDER_SITE_OTHER): Payer: Medicare Other | Admitting: Podiatry

## 2016-10-15 ENCOUNTER — Encounter: Payer: Self-pay | Admitting: Podiatry

## 2016-10-15 DIAGNOSIS — B351 Tinea unguium: Secondary | ICD-10-CM | POA: Diagnosis not present

## 2016-10-15 DIAGNOSIS — M79672 Pain in left foot: Secondary | ICD-10-CM

## 2016-10-15 DIAGNOSIS — M79671 Pain in right foot: Secondary | ICD-10-CM

## 2016-10-15 NOTE — Patient Instructions (Signed)
Seen for painful hypertrophic nails. All nails debrided. Return in 3 months or as needed.  

## 2016-10-15 NOTE — Progress Notes (Signed)
Subjective: 82 y.o. year old female patient presents complaining of painful nails. Toes been hurting for several weeks. Patient requests toe nails trimmed.  Had problem reading her blood sugar level.   Objective: Dermatologic: Thick yellow deformed nails with pain in both feet while in closed in shoes.   All nails are yellow thick and dystrophic with fungal debris under nail plate. Vascular: Pedal pulses are all palpable. Orthopedic: Contracted lesser digits  Neurologic: All epicritic and tactile sensations grossly intact.  Assessment: Dystrophic mycotic nails x 10. Painful nails x 10.  Treatment: All mycotic nails debrided.  Advised to get a new machine to check her sugar level. Return in 3 months or sooner if needed.

## 2016-11-10 ENCOUNTER — Encounter (HOSPITAL_COMMUNITY): Payer: Self-pay | Admitting: *Deleted

## 2016-11-10 ENCOUNTER — Emergency Department (HOSPITAL_COMMUNITY)
Admission: EM | Admit: 2016-11-10 | Discharge: 2016-11-11 | Disposition: A | Discharge status: Home / Self Care | Payer: Medicare Other | Attending: Emergency Medicine | Admitting: Emergency Medicine

## 2016-11-10 DIAGNOSIS — Z794 Long term (current) use of insulin: Secondary | ICD-10-CM | POA: Diagnosis not present

## 2016-11-10 DIAGNOSIS — R519 Headache, unspecified: Secondary | ICD-10-CM

## 2016-11-10 DIAGNOSIS — G4489 Other headache syndrome: Secondary | ICD-10-CM | POA: Diagnosis not present

## 2016-11-10 DIAGNOSIS — E119 Type 2 diabetes mellitus without complications: Secondary | ICD-10-CM | POA: Diagnosis not present

## 2016-11-10 DIAGNOSIS — I10 Essential (primary) hypertension: Secondary | ICD-10-CM | POA: Diagnosis not present

## 2016-11-10 DIAGNOSIS — Z7902 Long term (current) use of antithrombotics/antiplatelets: Secondary | ICD-10-CM | POA: Diagnosis not present

## 2016-11-10 DIAGNOSIS — R51 Headache: Secondary | ICD-10-CM | POA: Insufficient documentation

## 2016-11-10 DIAGNOSIS — E039 Hypothyroidism, unspecified: Secondary | ICD-10-CM | POA: Insufficient documentation

## 2016-11-10 DIAGNOSIS — R2681 Unsteadiness on feet: Secondary | ICD-10-CM | POA: Diagnosis not present

## 2016-11-10 LAB — URINALYSIS, ROUTINE W REFLEX MICROSCOPIC
BILIRUBIN URINE: NEGATIVE
Glucose, UA: 150 mg/dL — AB
Hgb urine dipstick: NEGATIVE
KETONES UR: NEGATIVE mg/dL
NITRITE: NEGATIVE
Protein, ur: NEGATIVE mg/dL
SPECIFIC GRAVITY, URINE: 1.005 (ref 1.005–1.030)
pH: 8 (ref 5.0–8.0)

## 2016-11-10 LAB — CBC
HEMATOCRIT: 39.6 % (ref 36.0–46.0)
HEMOGLOBIN: 13.1 g/dL (ref 12.0–15.0)
MCH: 27 pg (ref 26.0–34.0)
MCHC: 33.1 g/dL (ref 30.0–36.0)
MCV: 81.6 fL (ref 78.0–100.0)
Platelets: 246 10*3/uL (ref 150–400)
RBC: 4.85 MIL/uL (ref 3.87–5.11)
RDW: 13.5 % (ref 11.5–15.5)
WBC: 8.2 10*3/uL (ref 4.0–10.5)

## 2016-11-10 LAB — BASIC METABOLIC PANEL
ANION GAP: 10 (ref 5–15)
CHLORIDE: 102 mmol/L (ref 101–111)
CO2: 26 mmol/L (ref 22–32)
Calcium: 9.5 mg/dL (ref 8.9–10.3)
Creatinine, Ser: 0.85 mg/dL (ref 0.44–1.00)
GFR calc Af Amer: >60 mL/min (ref >60)
GLUCOSE: 100 mg/dL — AB (ref 65–99)
POTASSIUM: 3.2 mmol/L — AB (ref 3.5–5.1)
Sodium: 138 mmol/L (ref 135–145)

## 2016-11-10 NOTE — ED Provider Notes (Signed)
[facility] EMERGENCY DEPARTMENT Provider Note   CSN: [id number] Arrival date & time: 11/10/16  1842     History   Chief Complaint Chief Complaint  Patient presents with  . Headache    HPI Allison [name] is a 82 y.o. female.  The history is provided by the patient and medical records.  Headache      82 year old female with history of carpal tunnel, diabetes, GERD, hyperlipidemia, hypertension, history of vertigo, prior stroke, presenting to the ED with headache.  Patient states she has been experiencing headache for about 3 days now.  States it is generalized throughout her whole head, described as a dull ache.  States she has felt somewhat "swimmy headed" but denies feeling truly dizzy.  States her family told her she has been "staggering" but she is not really felt this way.  She does have a cane at home that she uses on occasion but not regularly.  States she has noticed recently that her right leg has intermittently been "giving out" on her.  States she notices this the most when trying to get out the car.  She denies any falls or head trauma from this.  She denies any focal numbness or weakness of her arms or legs at this time.  She denies any current dizziness.  She has not had any chest pain or shortness of breath.  No fever, chills, nasal congestion, cough, or other upper respiratory symptoms.  She does take daily aspirin and Plavix.  States she was recently discharged from the stroke clinic after her prior stroke, was told that everything was normal at her last check.  States her family encouraged her to come here today with concern for another stroke.  Of note, after being placed into the room, RN did notice that she was "staggering" a little when trying to change her clothes.  Past Medical History:  Diagnosis Date  . Carpal tunnel syndrome   . Diabetes mellitus without complication ([place])   . Dysphagia   . GERD (gastroesophageal reflux disease)   . Hiatal  hernia   . HLD (hyperlipidemia)   . Hx of adenomatous colonic polyps 06/19/2014  . Hypertension   . Hypothyroidism   . Onychomycosis 03/05/2012  . Pain in lower limb 04/18/2013  . Sciatic nerve pain   . Vertigo     Patient Active Problem List   Diagnosis Date Noted  . History of stroke 10/01/2016  . Anxiety state 06/03/2016  . ARI (acute respiratory infection) 01/22/2016  . Uncontrolled type 2 diabetes mellitus with hyperglycemia, with long-term current use of insulin ([place]) 10/29/2015  . Acute ischemic stroke ([place]) 10/29/2015  . Hallucinations   . Visual hallucinations 10/28/2015  . Stroke-like symptoms 10/28/2015  . CVA (cerebral vascular accident) ([place]) 10/28/2015  . Ischemic stroke ([place])   . Diabetes mellitus with complication ([place])   . Hx of adenomatous colonic polyps 06/19/2014  . Neuropathic pain 03/27/2014  . Uncontrolled type 2 diabetes with neuropathy ([place]) 03/27/2014  . Bilateral carpal tunnel syndrome 03/27/2014  . AKI (acute kidney injury) ([place]) 08/05/2013  . DM type 2, uncontrolled, with renal complications ([place]) 16/07/3708  . Hypercalcemia 08/05/2013  . PVD (peripheral vascular disease) ([place]) 03/05/2012  . Onychomycosis 03/05/2012  . Other hammer toe (acquired) 03/05/2012  . Abnormal CXR 12/25/2011  . Hypothyroidism 06/23/2006  . Dyslipidemia 06/23/2006  . Essential hypertension 06/23/2006  . GERD 06/23/2006  . IBS 06/23/2006    Past Surgical History:  Procedure Laterality Date  .  BLADDER SURGERY     Mesh implant  . COLONOSCOPY    . LARYNX SURGERY     vocal cords  . PARTIAL HYSTERECTOMY  1978  . VESICOVAGINAL FISTULA CLOSURE W/ TAH  1976    OB History    No data available       Home Medications    Prior to Admission medications   Medication Sig Start Date End Date Taking? Authorizing Provider  amLODipine (NORVASC) 10 MG tablet Take 1 tablet by mouth daily for blood pressure in the morning 06/30/16   Lauree Chandler, NP  atorvastatin (LIPITOR)  40 MG tablet Take 1 tablet (40 mg total) by mouth at bedtime. 10/13/16   Gildardo Cranker, DO  betamethasone dipropionate (DIPROLENE) 0.05 % cream Apply topically 2 (two) times daily.    [provider]  blood glucose meter kit and supplies KIT Check blood glucose twice daily before insulin therapy (ICD-10 E11.21, E11.65). 11/03/15   Reed, [name], DO  chlorthalidone (HYGROTON) 25 MG tablet Take 0.5 tablets (12.5 mg total) by mouth daily. 05/18/16   Gildardo Cranker, DO  citalopram (CELEXA) 10 MG tablet Take 1 tablet (10 mg total) by mouth daily. For anxiety in the morning 09/17/16   Gildardo Cranker, DO  citalopram (CELEXA) 10 MG tablet Take 1 tablet (10 mg total) by mouth daily. For anxiety in the morning 10/13/16   Gildardo Cranker, DO  clopidogrel (PLAVIX) 75 MG tablet Take 1 tablet (75 mg total) by mouth daily in the morning 09/17/16   Gildardo Cranker, DO  gabapentin (NEURONTIN) 100 MG capsule Take 2 capsules (200 mg total) by mouth 3 (three) times daily as needed (nerve pain). 07/29/16   Gildardo Cranker, DO  glucose blood [facility] VERIO) test strip Check blood sugar three times daily DX E11.21 10/02/16   Gildardo Cranker, DO  insulin aspart protamine- aspart (NOVOLOG MIX 70/30) (70-30) 100 UNIT/ML injection Inject 0.2 mLs (20 Units total) into the skin 2 (two) times daily with a meal. 05/11/16   Tivis Ringer, RPH-CPP  Insulin Pen Needle (FIFTY50 PEN NEEDLES) 32G X 4 MM MISC 1 each by Misc.(Non-Drug; Combo Route) route. 10/29/14   [provider]  levothyroxine (SYNTHROID, LEVOTHROID) 150 MCG tablet take 1 tablet by mouth every morning ON AN EMPTY STOMACH for THROID in the morning 09/17/16   Gildardo Cranker, DO  levothyroxine (SYNTHROID, LEVOTHROID) 150 MCG tablet take 1 tablet by mouth every morning ON AN EMPTY STOMACH for THROID in the morning 10/13/16   Gildardo Cranker, DO  losartan (COZAAR) 100 MG tablet Take 1 tablet (100 mg total) by mouth daily in the morning 09/17/16   Gildardo Cranker, DO    losartan (COZAAR) 100 MG tablet Take 1 tablet (100 mg total) by mouth daily in the morning 10/13/16   Gildardo Cranker, DO  meclizine (ANTIVERT) 25 MG tablet Take 1 tablet (25 mg total) by mouth 3 (three) times daily as needed for dizziness. 10/13/16   Gildardo Cranker, DO  metFORMIN (GLUCOPHAGE) 500 MG tablet take 2 tablets by mouth twice a day with food 05/18/16   Gildardo Cranker, DO  [facility] DELICA LANCETS 10F MISC Use to test blood sugar three times daily. Dx: E11.21 12/23/15   Gildardo Cranker, DO  pantoprazole (PROTONIX) 40 MG tablet Take 1 tablet (40 mg total) by mouth daily in the morning 09/17/16   Gildardo Cranker, DO  pantoprazole (PROTONIX) 40 MG tablet Take 1 tablet (40 mg total) by mouth daily in the morning 10/13/16   Gildardo Cranker,  DO  vitamin E (VITAMIN E) 1000 UNIT capsule Take 1,000 Units by mouth daily.    [provider]    Family History Family History  Problem Relation Age of Onset  . Alzheimer's disease Mother   . Glaucoma Mother   . Heart disease Father   . Alzheimer's disease Father   . Asthma Sister   . Allergies Sister   . Allergies Sister   . Sleep apnea Sister   . Heart disease Sister   . Breast cancer Maternal Aunt   . Pancreatic cancer Maternal Aunt   . Prostate cancer Maternal Uncle   . Prostate cancer Paternal Uncle   . Alcoholism Maternal Uncle   . Kidney disease Maternal Aunt   . Heart disease Maternal Aunt     Social History Social History  Substance Use Topics  . Smoking status: Never Smoker  . Smokeless tobacco: Never Used  . Alcohol use No     Allergies   Lantus [insulin glargine]; Levemir [insulin detemir]; Pineapple; Codeine; Invokana [canagliflozin]; Penicillins; Tradjenta [linagliptin]; and Vioxx [rofecoxib]   Review of Systems Review of Systems  Musculoskeletal: Positive for gait problem.  Neurological: Positive for headaches.  All other systems reviewed and are negative.    Physical Exam Updated Vital Signs BP (!) 171/91    Pulse 67   Temp 98 F (36.7 C) (Oral)   Resp 12   Ht '5\' 4"'  (1.626 m)   Wt 70.8 kg (156 lb)   SpO2 98%   BMI 26.78 kg/m   Physical Exam  Constitutional: She is oriented to person, place, and time. She appears well-developed and well-nourished. No distress.  HENT:  Head: Normocephalic and atraumatic.  Right Ear: External ear normal.  Left Ear: External ear normal.  Mouth/Throat: Oropharynx is clear and moist.  Eyes: Pupils are equal, round, and reactive to light. Conjunctivae and EOM are normal.  Neck: Normal range of motion and full passive range of motion without pain. Neck supple. No neck rigidity.  No rigidity, no meningismus  Cardiovascular: Normal rate, regular rhythm and normal heart sounds.   No murmur heard. Pulmonary/Chest: Effort normal and breath sounds normal. No respiratory distress. She has no wheezes. She has no rhonchi.  Abdominal: Soft. Bowel sounds are normal. There is no tenderness. There is no guarding.  Musculoskeletal: Normal range of motion. She exhibits no edema.  Neurological: She is alert and oriented to person, place, and time. She has normal strength. She displays no tremor. No cranial nerve deficit or sensory deficit. She displays no seizure activity.  AAOx3, answering questions and following commands appropriately; equal strength UE and LE bilaterally; CN grossly intact; moves all extremities appropriately without ataxia; no focal neuro deficits or facial asymmetry appreciated  Skin: Skin is warm and dry. No rash noted. She is not diaphoretic.  Psychiatric: She has a normal mood and affect. Her behavior is normal. Thought content normal.  Nursing note and vitals reviewed.    ED Treatments / Results  Labs (all labs ordered are listed, but only abnormal results are displayed) Labs Reviewed  BASIC METABOLIC PANEL - Abnormal; Notable for the following:       Result Value   Potassium 3.2 (*)    Glucose, Bld 100 (*)    BUN <5 (*)    All other  components within normal limits  URINALYSIS, ROUTINE W REFLEX MICROSCOPIC - Abnormal; Notable for the following:    Color, Urine STRAW (*)    Glucose, UA 150 (*)  Leukocytes, UA MODERATE (*)    Bacteria, UA RARE (*)    Squamous Epithelial / LPF 0-5 (*)    All other components within normal limits  CBC  CBG MONITORING, ED    EKG  EKG Interpretation None       Radiology Mr Brain Wo Contrast  Result Date: 11/11/2016 CLINICAL DATA:  Headache and unsteady gait. EXAM: MRI HEAD WITHOUT CONTRAST TECHNIQUE: Multiplanar, multiecho pulse sequences of the brain and surrounding structures were obtained without intravenous contrast. COMPARISON:  Brain MRI 02/15/2016 Head CT 04/02/2016 FINDINGS: Brain: The midline structures are normal. There is no focal diffusion restriction to indicate acute infarct. There is multifocal white matter hyperintensity suggesting chronic ischemic microangiopathy. There is no acute hemorrhage, dural abnormality or extra-axial collection. No intraparenchymal hematoma or chronic microhemorrhage. Vascular: Major intracranial arterial and venous sinus flow voids are preserved. Skull and upper cervical spine: The visualized skull base, calvarium, upper cervical spine and extracranial soft tissues are normal. Sinuses/Orbits: No fluid levels or advanced mucosal thickening. No mastoid or middle ear effusion. Normal orbits. IMPRESSION: Sequelae of chronic ischemic microangiopathy without acute intracranial abnormality. Electronically Signed   By: Ulyses Jarred M.D.   On: 11/11/2016 01:05    Procedures Procedures (including critical care time)  Medications Ordered in ED Medications - No data to display   Initial Impression / Assessment and Plan / ED Course  I have reviewed the triage vital signs and the nursing notes.  Pertinent labs & imaging results that were available during my care of the patient were reviewed by me and considered in my medical decision making (see  chart for details).  82 year old female here with headache.  Describes it as generalized, dull and aching in nature.  Family reported some "staggering".  Patient does have cane at home but has not been using it recently.  She denies any focal numbness or weakness of her extremities at present.  While getting undressed in the room, RN noticed that she appears somewhat "shaky" on her feet.  Neurologically, no focal deficits appreciated.  No signs/symptoms concerning for meningitis or other infectious etiology.  She is on aspirin and Plavix for stroke prevention.  Screening labs obtained from triage overall reassuring.  Given her history and risk factors, will obtain MRI to rule out stroke.  If negative, can likely discharge home.  MRI without acute intracranial abnormality.  Results discussed with patient, she is ready to go home.  I have recommended that she use her cane at home to assist with ambulation given that her gait has seemed somewhat shaky recently.  She is established with [facility] neurology, can follow-up with them and/or her PCP.  Discussed plan with patient, he/she acknowledged understanding and agreed with plan of care.  Return precautions given for new or worsening symptoms.  Patient seen and evaluated with attending physician, Dr. Leonette Monarch, who agrees with plan of care.  Final Clinical Impressions(s) / ED Diagnoses   Final diagnoses:  Nonintractable headache, unspecified chronicity pattern, unspecified headache type    New Prescriptions Discharge Medication List as of 11/11/2016  1:37 AM       Larene Pickett, PA-C 11/11/16 0205    Fatima Blank, MD 11/15/16 [phone]

## 2016-11-10 NOTE — ED Notes (Signed)
Patient transported to MRI 

## 2016-11-10 NOTE — ED Notes (Signed)
Pt reports her headache is gone, she was very fearful when she got the "really bad headache b/c of my stroke."  She was incontinent , RN assisted in changing pt.  Pt did stumble occasionally when changing.

## 2016-11-10 NOTE — ED Triage Notes (Signed)
Pt in from home via [facility], pt has had HA for several days and reports chronic unsteady gait ongoing for a few mths, hx of a CVA, pt hx of vertigo, no slurred speech, no visual changes, A&o x4

## 2016-11-11 ENCOUNTER — Emergency Department (HOSPITAL_COMMUNITY): Payer: Medicare Other

## 2016-11-11 DIAGNOSIS — R51 Headache: Secondary | ICD-10-CM | POA: Diagnosis not present

## 2016-11-11 NOTE — Discharge Instructions (Signed)
Your MRI today was normal. Please follow-up with your primary care doctor.  Use your cane at home if you continue to feel unsteady when walking. Return here for any new/worsening symptoms.

## 2016-11-12 ENCOUNTER — Other Ambulatory Visit: Payer: Self-pay | Admitting: Internal Medicine

## 2016-11-12 DIAGNOSIS — K219 Gastro-esophageal reflux disease without esophagitis: Secondary | ICD-10-CM

## 2016-11-12 DIAGNOSIS — E034 Atrophy of thyroid (acquired): Secondary | ICD-10-CM

## 2016-11-12 NOTE — Telephone Encounter (Signed)
May fill for 30 days only. She needs appt for future RF

## 2016-12-10 ENCOUNTER — Other Ambulatory Visit: Payer: Medicare Other

## 2016-12-10 DIAGNOSIS — Z794 Long term (current) use of insulin: Secondary | ICD-10-CM

## 2016-12-10 DIAGNOSIS — E1165 Type 2 diabetes mellitus with hyperglycemia: Secondary | ICD-10-CM | POA: Diagnosis not present

## 2016-12-11 ENCOUNTER — Other Ambulatory Visit: Payer: Self-pay | Admitting: Nurse Practitioner

## 2016-12-11 ENCOUNTER — Other Ambulatory Visit: Payer: Self-pay | Admitting: Internal Medicine

## 2016-12-11 LAB — COMPLETE METABOLIC PANEL WITH GFR
AG Ratio: 1.4 (calc) (ref 1.0–2.5)
ALT: 10 U/L (ref 6–29)
AST: 11 U/L (ref 10–35)
Albumin: 4 g/dL (ref 3.6–5.1)
Alkaline phosphatase (APISO): 69 U/L (ref 33–130)
BUN: 11 mg/dL (ref 7–25)
CO2: 29 mmol/L (ref 20–32)
Calcium: 9.6 mg/dL (ref 8.6–10.4)
Chloride: 101 mmol/L (ref 98–110)
Creat: 0.87 mg/dL (ref 0.60–0.93)
GFR, Est African American: 73 mL/min/{1.73_m2} (ref >=60)
GFR, Est Non African American: 63 mL/min/{1.73_m2} (ref >=60)
Globulin: 2.9 g/dL (calc) (ref 1.9–3.7)
Glucose, Bld: 311 mg/dL — ABNORMAL HIGH (ref 65–99)
Potassium: 4 mmol/L (ref 3.5–5.3)
Sodium: 138 mmol/L (ref 135–146)
Total Bilirubin: 0.5 mg/dL (ref 0.2–1.2)
Total Protein: 6.9 g/dL (ref 6.1–8.1)

## 2016-12-11 LAB — LIPID PANEL
Cholesterol: 307 mg/dL — ABNORMAL HIGH (ref <200)
HDL: 50 mg/dL — ABNORMAL LOW (ref >50)
LDL Cholesterol (Calc): 225 mg/dL (calc) — ABNORMAL HIGH
Non-HDL Cholesterol (Calc): 257 mg/dL (calc) — ABNORMAL HIGH (ref <130)
Total CHOL/HDL Ratio: 6.1 (calc) — ABNORMAL HIGH (ref <5.0)
Triglycerides: 155 mg/dL — ABNORMAL HIGH (ref <150)

## 2016-12-11 LAB — TSH: TSH: 15.51 mIU/L — ABNORMAL HIGH (ref 0.40–4.50)

## 2016-12-11 LAB — HEMOGLOBIN A1C
Hgb A1c MFr Bld: 10.2 % of total Hgb — ABNORMAL HIGH (ref <5.7)
Mean Plasma Glucose: 246 (calc)
eAG (mmol/L): 13.6 (calc)

## 2016-12-12 ENCOUNTER — Other Ambulatory Visit: Payer: Self-pay | Admitting: Internal Medicine

## 2016-12-12 DIAGNOSIS — E034 Atrophy of thyroid (acquired): Secondary | ICD-10-CM

## 2016-12-14 ENCOUNTER — Ambulatory Visit: Payer: Medicare Other | Admitting: Pharmacotherapy

## 2016-12-18 ENCOUNTER — Telehealth: Payer: Self-pay

## 2016-12-18 MED ORDER — INSULIN ASPART PROT & ASPART (70-30 MIX) 100 UNIT/ML PEN: PEN_INJECTOR | SUBCUTANEOUS | 2 refills | Status: DC

## 2016-12-18 NOTE — Telephone Encounter (Signed)
Patient came to office, she is out of Novolog 70/30, the insurance will not pay for it until 12/29/16. She has not had any today. She is out because she checks it before giving her self a shot each time. She feels she is wasting it. Spoke with Dr. Eulas Post, review samples of insulin we have, we didn't have any that she could take (see allergies). Her HgbA1c 10. 2. Per Dr. Eulas Post increase Novolog 70/30 from 22 units to 24 units twice daily. Patient wants it called to [place]. Medication update on list. Faxed to [facility].

## 2016-12-21 ENCOUNTER — Ambulatory Visit: Payer: Medicare Other | Admitting: Pharmacotherapy

## 2016-12-22 ENCOUNTER — Emergency Department (HOSPITAL_COMMUNITY)
Admission: EM | Admit: 2016-12-22 | Discharge: 2016-12-23 | Disposition: A | Discharge status: Home / Self Care | Payer: Medicare Other | Attending: Emergency Medicine | Admitting: Emergency Medicine

## 2016-12-22 ENCOUNTER — Encounter (HOSPITAL_COMMUNITY): Payer: Self-pay

## 2016-12-22 DIAGNOSIS — I10 Essential (primary) hypertension: Secondary | ICD-10-CM | POA: Diagnosis not present

## 2016-12-22 DIAGNOSIS — Z7984 Long term (current) use of oral hypoglycemic drugs: Secondary | ICD-10-CM | POA: Insufficient documentation

## 2016-12-22 DIAGNOSIS — Z8673 Personal history of transient ischemic attack (TIA), and cerebral infarction without residual deficits: Secondary | ICD-10-CM | POA: Diagnosis not present

## 2016-12-22 DIAGNOSIS — E039 Hypothyroidism, unspecified: Secondary | ICD-10-CM | POA: Diagnosis not present

## 2016-12-22 DIAGNOSIS — E876 Hypokalemia: Secondary | ICD-10-CM

## 2016-12-22 DIAGNOSIS — E785 Hyperlipidemia, unspecified: Secondary | ICD-10-CM | POA: Insufficient documentation

## 2016-12-22 DIAGNOSIS — E119 Type 2 diabetes mellitus without complications: Secondary | ICD-10-CM | POA: Insufficient documentation

## 2016-12-22 DIAGNOSIS — R1013 Epigastric pain: Secondary | ICD-10-CM | POA: Diagnosis not present

## 2016-12-22 DIAGNOSIS — K2970 Gastritis, unspecified, without bleeding: Secondary | ICD-10-CM | POA: Diagnosis not present

## 2016-12-22 DIAGNOSIS — R112 Nausea with vomiting, unspecified: Secondary | ICD-10-CM | POA: Diagnosis not present

## 2016-12-22 DIAGNOSIS — Z79899 Other long term (current) drug therapy: Secondary | ICD-10-CM | POA: Diagnosis not present

## 2016-12-22 LAB — COMPREHENSIVE METABOLIC PANEL
ALBUMIN: 4.1 g/dL (ref 3.5–5.0)
ALT: 14 U/L (ref 14–54)
ANION GAP: 10 (ref 5–15)
AST: 19 U/L (ref 15–41)
Alkaline Phosphatase: 72 U/L (ref 38–126)
BILIRUBIN TOTAL: 0.9 mg/dL (ref 0.3–1.2)
BUN: 6 mg/dL (ref 6–20)
CHLORIDE: 99 mmol/L — AB (ref 101–111)
CO2: 31 mmol/L (ref 22–32)
Calcium: 9.6 mg/dL (ref 8.9–10.3)
Creatinine, Ser: 0.8 mg/dL (ref 0.44–1.00)
GFR calc Af Amer: >60 mL/min (ref >60)
GFR calc non Af Amer: >60 mL/min (ref >60)
GLUCOSE: 214 mg/dL — AB (ref 65–99)
POTASSIUM: 3 mmol/L — AB (ref 3.5–5.1)
Sodium: 140 mmol/L (ref 135–145)
TOTAL PROTEIN: 7.3 g/dL (ref 6.5–8.1)

## 2016-12-22 LAB — CBC WITH DIFFERENTIAL/PLATELET
BASOS ABS: 0 10*3/uL (ref 0.0–0.1)
BASOS PCT: 0 %
EOS ABS: 0 10*3/uL (ref 0.0–0.7)
EOS PCT: 0 %
HEMATOCRIT: 40.8 % (ref 36.0–46.0)
Hemoglobin: 14 g/dL (ref 12.0–15.0)
Lymphocytes Relative: 11 %
Lymphs Abs: 1.5 10*3/uL (ref 0.7–4.0)
MCH: 27.5 pg (ref 26.0–34.0)
MCHC: 34.3 g/dL (ref 30.0–36.0)
MCV: 80.2 fL (ref 78.0–100.0)
MONO ABS: 1.1 10*3/uL — AB (ref 0.1–1.0)
MONOS PCT: 8 %
NEUTROS ABS: 10.7 10*3/uL — AB (ref 1.7–7.7)
Neutrophils Relative %: 81 %
PLATELETS: 253 10*3/uL (ref 150–400)
RBC: 5.09 MIL/uL (ref 3.87–5.11)
RDW: 13.3 % (ref 11.5–15.5)
WBC: 13.4 10*3/uL — ABNORMAL HIGH (ref 4.0–10.5)

## 2016-12-22 LAB — LIPASE, BLOOD: Lipase: 20 U/L (ref 11–51)

## 2016-12-22 LAB — POC OCCULT BLOOD, ED: Fecal Occult Bld: NEGATIVE

## 2016-12-22 MED ORDER — GI COCKTAIL ~~LOC~~
30.0000 mL | Freq: Once | ORAL | Status: AC
  Administered 2016-12-22: 30 mL via ORAL
  Filled 2016-12-22: qty 30

## 2016-12-22 MED ORDER — ONDANSETRON HCL 4 MG/2ML IJ SOLN
4.0000 mg | Freq: Once | INTRAMUSCULAR | Status: AC
  Administered 2016-12-22: 4 mg via INTRAVENOUS
  Filled 2016-12-22: qty 2

## 2016-12-22 NOTE — ED Triage Notes (Signed)
Pt presents with vomiting and abdominal pain that began about 1400 today.  Pt reports "black diarrhea" but good appetite.

## 2016-12-22 NOTE — Discharge Instructions (Signed)

## 2016-12-22 NOTE — ED Provider Notes (Signed)
[facility] EMERGENCY DEPARTMENT Provider Note  CSN: [id number] Arrival date & time: 12/22/16 2138  Chief Complaint(s) Emesis  HPI Allison [name] is a 82 y.o. female   The history is provided by the patient.  Emesis   This is a new problem. The current episode started 6 to 12 hours ago. The problem occurs 2 to 4 times per day. The problem has not changed since onset.The emesis has an appearance of stomach contents. There has been no fever. Associated symptoms include abdominal pain (epigastric discomfort after emesis). Pertinent negatives include no chills, no cough, no diarrhea, no fever, no headaches, no sweats and no URI. Risk factors include suspect food intake (reports eating chinese food last night that did not "sit well").    Past Medical History Past Medical History:  Diagnosis Date  . Carpal tunnel syndrome   . Diabetes mellitus without complication ([place])   . Dysphagia   . GERD (gastroesophageal reflux disease)   . Hiatal hernia   . HLD (hyperlipidemia)   . Hx of adenomatous colonic polyps 06/19/2014  . Hypertension   . Hypothyroidism   . Onychomycosis 03/05/2012  . Pain in lower limb 04/18/2013  . Sciatic nerve pain   . Vertigo    Patient Active Problem List   Diagnosis Date Noted  . History of stroke 10/01/2016  . Anxiety state 06/03/2016  . ARI (acute respiratory infection) 01/22/2016  . Uncontrolled type 2 diabetes mellitus with hyperglycemia, with long-term current use of insulin ([place]) 10/29/2015  . Acute ischemic stroke ([place]) 10/29/2015  . Hallucinations   . Visual hallucinations 10/28/2015  . Stroke-like symptoms 10/28/2015  . CVA (cerebral vascular accident) ([place]) 10/28/2015  . Ischemic stroke ([place])   . Diabetes mellitus with complication ([place])   . Hx of adenomatous colonic polyps 06/19/2014  . Neuropathic pain 03/27/2014  . Uncontrolled type 2 diabetes with neuropathy ([place]) 03/27/2014  . Bilateral carpal tunnel syndrome 03/27/2014  .  AKI (acute kidney injury) ([place]) 08/05/2013  . DM type 2, uncontrolled, with renal complications ([place]) 62/22/9798  . Hypercalcemia 08/05/2013  . PVD (peripheral vascular disease) ([place]) 03/05/2012  . Onychomycosis 03/05/2012  . Other hammer toe (acquired) 03/05/2012  . Abnormal CXR 12/25/2011  . Hypothyroidism 06/23/2006  . Dyslipidemia 06/23/2006  . Essential hypertension 06/23/2006  . GERD 06/23/2006  . IBS 06/23/2006   Home Medication(s) Prior to Admission medications   Medication Sig Start Date End Date Taking? Authorizing Provider  amLODipine (NORVASC) 10 MG tablet Take 1 tablet by mouth daily for blood pressure in the morning 12/11/16   Gildardo Cranker, DO  atorvastatin (LIPITOR) 40 MG tablet Take 1 tablet (40 mg total) by mouth at bedtime. 10/13/16   Gildardo Cranker, DO  blood glucose meter kit and supplies KIT Check blood glucose twice daily before insulin therapy (ICD-10 E11.21, E11.65). 11/03/15   Reed, [name], DO  chlorthalidone (HYGROTON) 25 MG tablet Take 0.5 tablets (12.5 mg total) by mouth daily. 05/18/16   Gildardo Cranker, DO  citalopram (CELEXA) 10 MG tablet Take 1 tablet (10 mg total) by mouth daily. For anxiety in the morning 12/14/16   Gildardo Cranker, DO  clopidogrel (PLAVIX) 75 MG tablet Take 1 tablet (75 mg total) by mouth daily in the morning 12/14/16   Gildardo Cranker, DO  gabapentin (NEURONTIN) 100 MG capsule Take 2 capsules (200 mg total) by mouth 3 (three) times daily as needed (nerve pain). 12/11/16   Gildardo Cranker, DO  glucose blood (ONETOUCH VERIO) test strip Check  blood sugar three times daily DX E11.21 10/02/16   Gildardo Cranker, DO  insulin aspart protamine - aspart (NOVOLOG MIX 70/30 FLEXPEN) (70-30) 100 UNIT/ML FlexPen Inject 24 units into skin twice daily before meals . Dx E11.9 12/18/16   Gildardo Cranker, DO  levothyroxine (SYNTHROID, LEVOTHROID) 150 MCG tablet take 1 tablet by mouth every morning ON AN EMPTY STOMACH for THROID in the morning 12/14/16   Gildardo Cranker, DO  losartan (COZAAR) 100 MG tablet Take 1 tablet (100 mg total) by mouth daily in the morning 12/14/16   Gildardo Cranker, DO  meclizine (ANTIVERT) 25 MG tablet Take 1 tablet (25 mg total) by mouth 3 (three) times daily as needed for dizziness. 10/13/16   Gildardo Cranker, DO  metFORMIN (GLUCOPHAGE) 500 MG tablet take 2 tablets by mouth twice a day with food 05/18/16   Gildardo Cranker, DO  [facility] DELICA LANCETS 19F MISC Use to test blood sugar three times daily. Dx: E11.21 12/23/15   Gildardo Cranker, DO  pantoprazole (PROTONIX) 40 MG tablet Take 1 tablet (40 mg total) by mouth daily in the morning 11/12/16   Gildardo Cranker, DO  vitamin E (VITAMIN E) 1000 UNIT capsule Take 1,000 Units by mouth daily.    [provider]                                                                                                                                    Past Surgical History Past Surgical History:  Procedure Laterality Date  . BLADDER SURGERY     Mesh implant  . COLONOSCOPY    . LARYNX SURGERY     vocal cords  . PARTIAL HYSTERECTOMY  1978  . VESICOVAGINAL FISTULA CLOSURE W/ TAH  1976   Family History Family History  Problem Relation Age of Onset  . Alzheimer's disease Mother   . Glaucoma Mother   . Heart disease Father   . Alzheimer's disease Father   . Asthma Sister   . Allergies Sister   . Allergies Sister   . Sleep apnea Sister   . Heart disease Sister   . Breast cancer Maternal Aunt   . Pancreatic cancer Maternal Aunt   . Prostate cancer Maternal Uncle   . Prostate cancer Paternal Uncle   . Alcoholism Maternal Uncle   . Kidney disease Maternal Aunt   . Heart disease Maternal Aunt     Social History Social History   Tobacco Use  . Smoking status: Never Smoker  . Smokeless tobacco: Never Used  Substance Use Topics  . Alcohol use: No    Alcohol/week: 0.0 oz  . Drug use: No   Allergies Lantus [insulin glargine]; Levemir [insulin detemir]; Pineapple; Codeine;  Invokana [canagliflozin]; Penicillins; Tradjenta [linagliptin]; and Vioxx [rofecoxib]  Review of Systems Review of Systems  Constitutional: Negative for chills and fever.  Respiratory: Negative for cough.   Gastrointestinal: Positive for abdominal pain (epigastric discomfort after emesis) and  vomiting. Negative for diarrhea.  Genitourinary: Negative for dysuria and frequency.  Neurological: Negative for headaches.   All other systems are reviewed and are negative for acute change except as noted in the HPI  Physical Exam Vital Signs  I have reviewed the triage vital signs BP (!) 175/89 (BP Location: Right Arm)   Pulse 87   Resp 11   SpO2 97%   Physical Exam  Constitutional: She is oriented to person, place, and time. She appears well-developed and well-nourished. No distress.  HENT:  Head: Normocephalic and atraumatic.  Nose: Nose normal.  Eyes: Conjunctivae and EOM are normal. Pupils are equal, round, and reactive to light. Right eye exhibits no discharge. Left eye exhibits no discharge. No scleral icterus.  Neck: Normal range of motion. Neck supple.  Cardiovascular: Normal rate and regular rhythm. Exam reveals no gallop and no friction rub.  No murmur heard. Pulmonary/Chest: Effort normal and breath sounds normal. No stridor. No respiratory distress. She has no rales.  Abdominal: Soft. She exhibits no distension. There is no tenderness. There is no rigidity, no rebound and no guarding.  Musculoskeletal: She exhibits no edema or tenderness.  Neurological: She is alert and oriented to person, place, and time.  Skin: Skin is warm and dry. No rash noted. She is not diaphoretic. No erythema.  Psychiatric: She has a normal mood and affect.  Vitals reviewed.   ED Results and Treatments Labs (all labs ordered are listed, but only abnormal results are displayed) Labs Reviewed  CBC WITH DIFFERENTIAL/PLATELET - Abnormal; Notable for the following components:      Result Value   WBC  13.4 (*)    Neutro Abs 10.7 (*)    Monocytes Absolute 1.1 (*)    All other components within normal limits  COMPREHENSIVE METABOLIC PANEL - Abnormal; Notable for the following components:   Potassium 3.0 (*)    Chloride 99 (*)    Glucose, Bld 214 (*)    All other components within normal limits  LIPASE, BLOOD  POC OCCULT BLOOD, ED                                                                                                                         EKG  EKG Interpretation  Date/Time:  Tuesday December 22 2016 23:05:06 EST Ventricular Rate:  72 PR Interval:    QRS Duration: 89 QT Interval:  387 QTC Calculation: 424 R Axis:   -4 Text Interpretation:  Sinus rhythm Baseline wander in lead(s) V2 V3 V6 No significant change since last tracing Confirmed by Addison Lank [phone]) on 12/22/2016 11:20:54 PM      Radiology No results found. Pertinent labs & imaging results that were available during my care of the patient were reviewed by me and considered in my medical decision making (see chart for details).  Medications Ordered in ED Medications  ondansetron (ZOFRAN) injection 4 mg (4 mg Intravenous Given 12/22/16 2215)  gi cocktail (Maalox,Lidocaine,Donnatal) (30 mLs Oral Given 12/22/16  2216)                                                                                                                                    Procedures Procedures  (including critical care time)  Medical Decision Making / ED Course I have reviewed the nursing notes for this encounter and the patient's prior records (if available in EHR or on provided paperwork).    82 y.o. female presents with vomiting and epigastric discomfort.  Possible suspicious food intake.  Rest of history as above.  Patient appears well, not in distress, and with no signs of toxicity or dehydration. Abdomen benign. Rest of the exam as above  Labs did reveal leukocytosis but no evidence of biliary obstruction or  pancreatitis.  Complete resolution with GI cocktail. EKG no acute changes; doubt cardiac etiology.  Mildly low K+ and hyperglycemia but no evidence of DKA.  Most consistent with viral process vs food poisoning.   Doubt appendicitis, diverticulitis, severe colitis, dysentery.    Able to tolerate oral intake in the ED.  Discussed symptomatic treatment with the patient and they will follow closely with their PCP.   Final Clinical Impression(s) / ED Diagnoses Final diagnoses:  Epigastric pain  Non-intractable vomiting with nausea, unspecified vomiting type  Hypokalemia   Disposition: Discharge  Condition: Good  I have discussed the results, Dx and Tx plan with the patient who expressed understanding and agree(s) with the plan. Discharge instructions discussed at great length. The patient was given strict return precautions who verbalized understanding of the instructions. No further questions at time of discharge.    ED Discharge Orders    None       Follow Up: Gildardo Cranker, DO [address] [place] [address] [phone]  Schedule an appointment as soon as possible for a visit  As needed      This chart was dictated using voice recognition software.  Despite best efforts to proofread,  errors can occur which can change the documentation meaning.   Fatima Blank, MD 12/22/16 [phone]

## 2016-12-23 NOTE — ED Notes (Signed)
Pt verbalized understanding discharge instructions and denies any further needs or questions at this time. VS stable, ambulatory and steady gait.   

## 2016-12-28 ENCOUNTER — Other Ambulatory Visit: Payer: Self-pay | Admitting: *Deleted

## 2016-12-28 MED ORDER — ONETOUCH DELICA LANCETS 33G MISC: 0 refills | Status: DC

## 2016-12-28 NOTE — Telephone Encounter (Signed)
[facility]. Patient needs an appointment before anymore future refills.

## 2017-01-04 ENCOUNTER — Other Ambulatory Visit: Payer: Self-pay | Admitting: Internal Medicine

## 2017-01-04 DIAGNOSIS — E034 Atrophy of thyroid (acquired): Secondary | ICD-10-CM

## 2017-01-04 DIAGNOSIS — K219 Gastro-esophageal reflux disease without esophagitis: Secondary | ICD-10-CM

## 2017-01-18 ENCOUNTER — Ambulatory Visit: Payer: Medicare Other | Admitting: Podiatry

## 2017-01-25 ENCOUNTER — Other Ambulatory Visit: Payer: Self-pay | Admitting: *Deleted

## 2017-01-25 MED ORDER — GLUCOSE BLOOD VI STRP: ORAL_STRIP | 11 refills | Status: DC

## 2017-01-25 NOTE — Telephone Encounter (Signed)
[facility] [address] 

## 2017-01-29 ENCOUNTER — Ambulatory Visit: Payer: Medicare Other | Admitting: Internal Medicine

## 2017-02-05 ENCOUNTER — Ambulatory Visit: Payer: Medicare Other | Admitting: Internal Medicine

## 2017-02-10 ENCOUNTER — Other Ambulatory Visit: Payer: Self-pay | Admitting: Internal Medicine

## 2017-02-10 DIAGNOSIS — K219 Gastro-esophageal reflux disease without esophagitis: Secondary | ICD-10-CM

## 2017-02-10 DIAGNOSIS — E034 Atrophy of thyroid (acquired): Secondary | ICD-10-CM

## 2017-02-10 NOTE — Telephone Encounter (Signed)
Patient aware rx's will be filled on Friday at pending appointment. Patient has no showed several appointments

## 2017-02-12 ENCOUNTER — Ambulatory Visit (INDEPENDENT_AMBULATORY_CARE_PROVIDER_SITE_OTHER): Payer: Medicare Other | Admitting: Internal Medicine

## 2017-02-12 ENCOUNTER — Encounter: Payer: Self-pay | Admitting: Internal Medicine

## 2017-02-12 VITALS — BP 158/70 | HR 67 | Temp 97.6°F | Wt 154.0 lb

## 2017-02-12 DIAGNOSIS — E034 Atrophy of thyroid (acquired): Secondary | ICD-10-CM | POA: Diagnosis not present

## 2017-02-12 DIAGNOSIS — R4189 Other symptoms and signs involving cognitive functions and awareness: Secondary | ICD-10-CM | POA: Diagnosis not present

## 2017-02-12 DIAGNOSIS — E876 Hypokalemia: Secondary | ICD-10-CM

## 2017-02-12 DIAGNOSIS — Z23 Encounter for immunization: Secondary | ICD-10-CM | POA: Diagnosis not present

## 2017-02-12 DIAGNOSIS — Z794 Long term (current) use of insulin: Secondary | ICD-10-CM

## 2017-02-12 DIAGNOSIS — I10 Essential (primary) hypertension: Secondary | ICD-10-CM | POA: Diagnosis not present

## 2017-02-12 DIAGNOSIS — Z9114 Patient's other noncompliance with medication regimen: Secondary | ICD-10-CM | POA: Diagnosis not present

## 2017-02-12 DIAGNOSIS — E1165 Type 2 diabetes mellitus with hyperglycemia: Secondary | ICD-10-CM | POA: Diagnosis not present

## 2017-02-12 DIAGNOSIS — E785 Hyperlipidemia, unspecified: Secondary | ICD-10-CM

## 2017-02-12 DIAGNOSIS — D72829 Elevated white blood cell count, unspecified: Secondary | ICD-10-CM | POA: Diagnosis not present

## 2017-02-12 DIAGNOSIS — Z8673 Personal history of transient ischemic attack (TIA), and cerebral infarction without residual deficits: Secondary | ICD-10-CM

## 2017-02-12 DIAGNOSIS — E1169 Type 2 diabetes mellitus with other specified complication: Secondary | ICD-10-CM

## 2017-02-12 LAB — BASIC METABOLIC PANEL WITH GFR
BUN: 7 mg/dL (ref 7–25)
CALCIUM: 9.2 mg/dL (ref 8.6–10.4)
CO2: 30 mmol/L (ref 20–32)
Chloride: 106 mmol/L (ref 98–110)
Creat: 0.74 mg/dL (ref 0.60–0.88)
GFR, EST AFRICAN AMERICAN: 89 mL/min/{1.73_m2} (ref >=60)
GFR, Est Non African American: 77 mL/min/{1.73_m2} (ref >=60)
Glucose, Bld: 216 mg/dL — ABNORMAL HIGH (ref 65–99)
POTASSIUM: 3.5 mmol/L (ref 3.5–5.3)
SODIUM: 141 mmol/L (ref 135–146)

## 2017-02-12 LAB — CBC WITH DIFFERENTIAL/PLATELET
BASOS ABS: 98 {cells}/uL (ref 0–200)
Basophils Relative: 1.4 %
Eosinophils Absolute: 252 cells/uL (ref 15–500)
Eosinophils Relative: 3.6 %
HCT: 38.8 % (ref 35.0–45.0)
HEMOGLOBIN: 13.1 g/dL (ref 11.7–15.5)
Lymphs Abs: 2156 cells/uL (ref 850–3900)
MCH: 27.1 pg (ref 27.0–33.0)
MCHC: 33.8 g/dL (ref 32.0–36.0)
MCV: 80.3 fL (ref 80.0–100.0)
MONOS PCT: 9 %
MPV: 11.2 fL (ref 7.5–12.5)
NEUTROS ABS: 3864 {cells}/uL (ref 1500–7800)
Neutrophils Relative %: 55.2 %
PLATELETS: 235 10*3/uL (ref 140–400)
RBC: 4.83 10*6/uL (ref 3.80–5.10)
RDW: 13.2 % (ref 11.0–15.0)
TOTAL LYMPHOCYTE: 30.8 %
WBC mixed population: 630 cells/uL (ref 200–950)
WBC: 7 10*3/uL (ref 3.8–10.8)

## 2017-02-12 LAB — TSH: TSH: 12.94 m[IU]/L — AB (ref 0.40–4.50)

## 2017-02-12 LAB — T4, FREE: FREE T4: 0.9 ng/dL (ref 0.8–1.8)

## 2017-02-12 NOTE — Progress Notes (Signed)
Patient ID: Helmut Muster, female   DOB: 28-Apr-1934, 83 y.o.   MRN: [id number]   Location:  [facility] OFFICE  Provider: DR Arletha Grippe  Code Status: FULLCODE Goals of Care:  Advanced Directives 09/21/2016  Does Patient Have a Medical Advance Directive? No  Would patient like information on creating a medical advance directive? -     Chief Complaint  Patient presents with  . Medical Management of Chronic Issues    follow-up    HPI: Patient is a 83 y.o. female seen today for medical management of chronic diseases.  She has not been seen since 05/2016. She did f/u with diabetic educator. She states her grandson ran away to [place] and the uncle brought him back to [facility]. Her grandson brought her to her appt today. She is unsure of which medication she is taking.  She has been seen in the ED x 2 in 2018 - December for N/V and epigastric pain (K 3.0; glucose 214) ; October for HA.    She is a poor historian due to cognitive deficit related to stroke. Hx obtained from chart. She is alone in examination room  HTN - BP stable on chlorthalidone and losartan. She checks BP at home.  Hyperlipidemia - uncontrolled. LDL 225(prev 130l);Tchol 307/HDL 50. Not taking lipitor. No myalgias.  hypothyroidism - uncontrolled. Unsure if she is taking levothyroxine.TSH 15.51. (+) cold intolerance, increased anxiety/depression, palpitations. She gained 4 lbs since mid April 2018  DM - uncontrolled. A1c 10.2%. BS elevated most days. She is supposed to be taking metformin and novolog 70/30 - 20 units BID. Numbness in fingers. She saw eye specialist in Feb 2017. No diabetic changes seen. 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 Uses magnifying glass to read at times. She reported angioedema with lantus and levemir. She does not take gabapentin for neuropathy and losartan for renal protection on a consistent basis. LDL 225; Tchol 307.   GERD - stable on pantoprazole  Anxiety - uncontrolled. Off lorazepam. She is not consistent with  citalopram   Hx CVA with slurred speech/RLE hemiparesis (dx Oct 2017 - right internal capsule lacunar infarct) - not taking plavix daily. She has some memory deficit   Past Medical History:  Diagnosis Date  . Carpal tunnel syndrome   . Diabetes mellitus without complication ([place])   . Dysphagia   . GERD (gastroesophageal reflux disease)   . Hiatal hernia   . HLD (hyperlipidemia)   . Hx of adenomatous colonic polyps 06/19/2014  . Hypertension   . Hypothyroidism   . Onychomycosis 03/05/2012  . Pain in lower limb 04/18/2013  . Sciatic nerve pain   . Vertigo     Past Surgical History:  Procedure Laterality Date  . BLADDER SURGERY     Mesh implant  . COLONOSCOPY    . LARYNX SURGERY     vocal cords  . PARTIAL HYSTERECTOMY  1978  . VESICOVAGINAL FISTULA CLOSURE W/ TAH  1976     reports that  has never smoked. she has never used smokeless tobacco. She reports that she does not drink alcohol or use drugs. Social History   Socioeconomic History  . Marital status: Single    Spouse name: Not on file  . Number of children: 2  . Years of education: Not on file  . Highest education level: Not on file  Social Needs  . Financial resource strain: Not on file  . Food insecurity - worry: Not on file  . Food insecurity - inability: Not  on file  . Transportation needs - medical: Not on file  . Transportation needs - non-medical: Not on file  Occupational History  . Occupation: Retired  Tobacco Use  . Smoking status: Never Smoker  . Smokeless tobacco: Never Used  Substance and Sexual Activity  . Alcohol use: No    Alcohol/week: 0.0 oz  . Drug use: No  . Sexual activity: No    Birth control/protection: Abstinence  Other Topics Concern  . Not on file  Social History Narrative   Single   2 sons   Retired   2 cups caffeine daily   05/01/2014    Family History  Problem Relation Age of Onset  . Alzheimer's disease Mother   . Glaucoma Mother   . Heart disease Father   .  Alzheimer's disease Father   . Asthma Sister   . Allergies Sister   . Allergies Sister   . Sleep apnea Sister   . Heart disease Sister   . Breast cancer Maternal Aunt   . Pancreatic cancer Maternal Aunt   . Prostate cancer Maternal Uncle   . Prostate cancer Paternal Uncle   . Alcoholism Maternal Uncle   . Kidney disease Maternal Aunt   . Heart disease Maternal Aunt     Allergies  Allergen Reactions  . Lantus [Insulin Glargine] Itching and Swelling    angioedema  . Levemir [Insulin Detemir] Itching and Swelling    angioedema  . Pineapple Anaphylaxis  . Codeine Other (See Comments)    unknown  . Invokana [Canagliflozin] Other (See Comments)    Constipation, nightmares  . Penicillins Other (See Comments)    Unknown   . Tradjenta [Linagliptin] Other (See Comments)  . Vioxx [Rofecoxib] Other (See Comments)    unknown    Outpatient Encounter Medications as of 02/12/2017  Medication Sig  . amLODipine (NORVASC) 10 MG tablet Take 1 tablet by mouth daily for blood pressure in the morning  . atorvastatin (LIPITOR) 40 MG tablet Take 1 tablet (40 mg total) by mouth at bedtime.  . chlorthalidone (HYGROTON) 25 MG tablet Take 0.5 tablets (12.5 mg total) by mouth daily in the morning  . citalopram (CELEXA) 10 MG tablet Take 1 tablet (10 mg total) by mouth daily. For anxiety in the morning  . clopidogrel (PLAVIX) 75 MG tablet Take 1 tablet (75 mg total) by mouth daily in the morning  . gabapentin (NEURONTIN) 100 MG capsule Take 2 capsules (200 mg total) by mouth 3 (three) times daily as needed (nerve pain).  Marland Kitchen glucose blood (ONETOUCH VERIO) test strip Check blood sugar three times daily DX E11.21  . insulin aspart protamine - aspart (NOVOLOG MIX 70/30 FLEXPEN) (70-30) 100 UNIT/ML FlexPen Inject 24 units into skin twice daily before meals . Dx E11.9  . levothyroxine (SYNTHROID, LEVOTHROID) 150 MCG tablet take 1 tablet by mouth every morning ON AN EMPTY STOMACH for THROID in the morning  .  losartan (COZAAR) 100 MG tablet Take 1 tablet (100 mg total) by mouth daily in the morning  . meclizine (ANTIVERT) 25 MG tablet Take 1 tablet (25 mg total) by mouth 3 (three) times daily as needed for dizziness.  . metFORMIN (GLUCOPHAGE) 500 MG tablet take 2 tablets by mouth twice a day with food  . ONETOUCH DELICA LANCETS 45Y MISC Use to test blood sugar three times daily. Dx: E11.21 Patient needs an appointment before anymore future refills.  . pantoprazole (PROTONIX) 40 MG tablet Take 1 tablet (40 mg total) by mouth daily  in the morning  . vitamin E (VITAMIN E) 1000 UNIT capsule Take 1,000 Units by mouth daily.  . [DISCONTINUED] blood glucose meter kit and supplies KIT Check blood glucose twice daily before insulin therapy (ICD-10 E11.21, E11.65).   No facility-administered encounter medications on file as of 02/12/2017.     Review of Systems:  Review of Systems  Unable to perform ROS: Other (memory loss)    Health Maintenance  Topic Date Due  . OPHTHALMOLOGY EXAM  12/30/1944  . TETANUS/TDAP  06/12/2013  . INFLUENZA VACCINE  08/12/2016  . FOOT EXAM  09/03/2016  . HEMOGLOBIN A1C  06/09/2017  . DEXA SCAN  Completed  . PNA vac Low Risk Adult  Completed    Physical Exam: Vitals:   02/12/17 0950  BP: (!) 158/70  Pulse: 67  Temp: 97.6 F (36.4 C)  TempSrc: Oral  SpO2: 99%  Weight: 154 lb (69.9 kg)   Body mass index is 26.43 kg/m. Physical Exam  Constitutional: She appears well-developed and well-nourished.  HENT:  Mouth/Throat: Oropharynx is clear and moist. No oropharyngeal exudate.  MMM; no oral thrush  Eyes: Pupils are equal, round, and reactive to light. No scleral icterus.  Neck: Neck supple. Carotid bruit is not present. No tracheal deviation present. No thyromegaly present.  Cardiovascular: Normal rate, regular rhythm, normal heart sounds and intact distal pulses. Exam reveals no gallop and no friction rub.  No murmur heard. No LE edema b/l. no calf TTP.     Pulmonary/Chest: Effort normal and breath sounds normal. No stridor. No respiratory distress. She has no wheezes. She has no rales.  Abdominal: Soft. Normal appearance and bowel sounds are normal. She exhibits no distension and no mass. There is no hepatomegaly. There is tenderness (RLQ). There is no rigidity, no rebound and no guarding. No hernia.  obese  Musculoskeletal: She exhibits edema.  Lymphadenopathy:    She has no cervical adenopathy.  Neurological: She is alert.  Skin: Skin is warm and dry. No rash noted.  dry  Psychiatric: She has a normal mood and affect. Her behavior is normal.   Diabetic Foot Exam - Simple   Simple Foot Form Diabetic Foot exam was performed with the following findings:  Yes 02/12/2017 10:32 AM  Visual Inspection See comments:  Yes Sensation Testing Intact to touch and monofilament testing bilaterally:  Yes Pulse Check Posterior Tibialis and Dorsalis pulse intact bilaterally:  Yes Comments Toenail hypertrophy with very thick nails b/l 1st and 2nd toenail; no calluses or ulcerations      Labs reviewed: Basic Metabolic Panel: Recent Labs    06/10/16 0907 09/17/16 1129 11/10/16 1852 12/10/16 1008 12/22/16 2206  NA 138 138 138 138 140  K 4.0 4.3 3.2* 4.0 3.0*  CL 102 100 102 101 99*  CO2 32* '29 26 29 31  ' GLUCOSE 237* 263* 100* 311* 214*  BUN 11 9 <5* 11 6  CREATININE 0.91 0.78 0.85 0.87 0.80  CALCIUM 9.8 9.8 9.5 9.6 9.6  TSH 11.56* 14.56*  --  15.51*  --    Liver Function Tests: Recent Labs    02/15/16 0417 06/10/16 0907 09/17/16 1129 12/10/16 1008 12/22/16 2206  AST '17 15 13 11 19  ' ALT 11* '11 12 10 14  ' ALKPHOS 47 69  --   --  72  BILITOT 0.7 0.8 0.7 0.5 0.9  PROT 6.1* 7.0 7.0 6.9 7.3  ALBUMIN 3.4* 4.0  --   --  4.1   Recent Labs    12/22/16  2206  LIPASE 20   No results for input(s): AMMONIA in the last 8760 hours. CBC: Recent Labs    02/15/16 0417  04/02/16 1043 11/10/16 1852 12/22/16 2206  WBC 7.0  --  7.5 8.2 13.4*   NEUTROABS 3.9  --  4.3  --  10.7*  HGB 11.7*   < > 13.5 13.1 14.0  HCT 34.0*   < > 40.3 39.6 40.8  MCV 80.2  --  80.4 81.6 80.2  PLT 246  --  236 246 253   < > = values in this interval not displayed.   Lipid Panel: Recent Labs    02/13/16 1425 06/10/16 0907 12/10/16 1008  CHOL 210* 236* 307*  HDL 44* 53 50*  LDLCALC 130* 165*  --   TRIG 182* 89 155*  CHOLHDL 4.8 4.5 6.1*   Lab Results  Component Value Date   HGBA1C 10.2 (H) 12/10/2016    Procedures since last visit: No results found.  Assessment/Plan   ICD-10-CM   1. Hypokalemia E87.6 BMP with eGFR([facility])  2. Leukocytosis, unspecified type D72.829 CBC with Differential/Platelets  3. Uncontrolled type 2 diabetes mellitus with hyperglycemia, with long-term current use of insulin ([facility]) E11.65 BMP with eGFR([facility])   Z79.4 Ambulatory referral to [facility]  4. Hypothyroidism due to acquired atrophy of thyroid E03.4 TSH    T4, Free    Ambulatory referral to [facility]  5. Essential hypertension I10 Ambulatory referral to [facility]  6. Nonadherence to medication Z91.14 Ambulatory referral to [facility]  7. Cognitive impairment R41.89 Ambulatory referral to [facility]  8. History of stroke Z86.73   9. Hyperlipidemia associated with type 2 diabetes mellitus ([place]) E11.69 Ambulatory referral to [facility]   E78.5     She is at high risk for cardiovascular complications 2/2 noncompliance with medication regimen.  PLEASE TAKE ALL MEDICATION AS ORDERED!  Follow up with Tivis Ringer as scheduled for diabetes management  Will call with lab results  Will call with [facility] referral  Influenza vaccine given today  Follow up in 2 mos for DM, HTN, hyperlipidemia, memory loss   Mallori Araque S. Perlie Gold  [facility] [address] [place], [place] [address] [phone] Cell (Monday-Friday 8 AM - 5 PM) [phone] After 5 PM and follow prompts

## 2017-02-12 NOTE — Patient Instructions (Addendum)
PLEASE TAKE ALL MEDICATION AS ORDERED!  Follow up with Tivis Ringer as scheduled for diabetes management  Will call with lab results  Will call with [facility] referral  Influenza vaccine given today  Follow up in 2 mos for DM, HTN, hyperlipidemia, memory loss

## 2017-02-15 ENCOUNTER — Ambulatory Visit: Payer: Medicare Other | Admitting: Pharmacotherapy

## 2017-02-22 ENCOUNTER — Ambulatory Visit (INDEPENDENT_AMBULATORY_CARE_PROVIDER_SITE_OTHER): Payer: Medicare Other | Admitting: Pharmacotherapy

## 2017-02-22 ENCOUNTER — Encounter: Payer: Self-pay | Admitting: Pharmacotherapy

## 2017-02-22 VITALS — BP 144/86 | HR 88 | Resp 10 | Ht 64.0 in | Wt 152.8 lb

## 2017-02-22 DIAGNOSIS — Z9114 Patient's other noncompliance with medication regimen: Secondary | ICD-10-CM | POA: Diagnosis not present

## 2017-02-22 DIAGNOSIS — E1165 Type 2 diabetes mellitus with hyperglycemia: Secondary | ICD-10-CM

## 2017-02-22 DIAGNOSIS — Z794 Long term (current) use of insulin: Secondary | ICD-10-CM | POA: Diagnosis not present

## 2017-02-22 DIAGNOSIS — I10 Essential (primary) hypertension: Secondary | ICD-10-CM

## 2017-02-22 DIAGNOSIS — Z91148 Patient's other noncompliance with medication regimen for other reason: Secondary | ICD-10-CM

## 2017-02-22 NOTE — Progress Notes (Signed)
  Subjective:    Allison [name] is a 83 y.o.African American female who presents for follow-up of Type 2 diabetes mellitus.   A1C: 10.2% EGFR: 6m/min TSH: 12.94  Historically non-compliant with all medications.  [facility] have attempted to assist with pill packing medications for easier administration, but have discharged her from their service.  Did not bring blood glucose meter to office visit. Admits to seeing BG of 5018mdl No hypoglycemia  She is drinking water. Notices her BG will come down if she takes her medication. Trying to make healthy food choices. No routine exercise.  Has Silver Sneakers benefit, but not using. Has blurry vision Wears glasses. Denies problems with feet. Has dry skin Peripheral edema present Complains of neuropathy in hands. Nocturia 2 times per night No dysuria   Review of Systems A comprehensive review of systems was negative except for: Eyes: positive for contacts/glasses Cardiovascular: positive for lower extremity edema Genitourinary: positive for nocturia Integument/breast: positive for dryness Endocrine: positive for diabetic symptoms including blurry vision, increased fatigue and skin dryness    Objective:    BP (!) 144/86   Pulse 88   Resp 10   Ht _0  (1.626 m)   Wt 152 lb 12.8 oz (69.3 kg)   BMI 26.23 kg/m   General:  alert, cooperative and no distress  Oropharynx: normal findings: lips normal without lesions and gums healthy   Eyes:  negative findings: lids and lashes normal and conjunctivae and sclerae normal   Ears:  external ears normal        Lung: clear to auscultation bilaterally  Heart:  regular rate and rhythm     Extremities: edema bilateral lower extremities  Skin: dry     Neuro: mental status, speech normal, alert and oriented x3 and gait and station normal   Lab Review Glucose, Bld (mg/dL)  Date Value  02/12/2017 216 (H)  12/22/2016 214 (H)  12/10/2016 311 (H)    CO2 (mmol/L)  Date Value  02/12/2017 30  12/22/2016 31  12/10/2016 29   BUN (mg/dL)  Date Value  02/12/2017 7  12/22/2016 6  12/10/2016 11  05/22/2015 10  02/20/2015 12  10/19/2014 11   Creat (mg/dL)  Date Value  02/12/2017 0.74  12/10/2016 0.87  09/17/2016 0.78   Creatinine, Ser (mg/dL)  Date Value  12/22/2016 0.80  11/10/2016 0.85       Assessment:    Diabetes Mellitus type II, under poor control.  Due to not taking medication as prescribed. BP above goal <130/80 due to not taking medication as prescribed. TSH too high due to not taking her medications as prescribed.   Plan:    1.  Rx changes: needs to take medications as prescribed.  2.  Novolog 70/30 Mix 24 units with breakfast and supper. 3.  Metformin 100064mwice daily 4.  Counseled extensively on importance of taking medications as prescribed.  Explained her risk of heart attack, stroke, kidney failure, amputation, etc. Due to not taking her medication. 5.  Counseled on nutrition goals 6   Counseled on need for routine exercise.  Goal is 30-45 minutes 5 x week. 7.  BP too high due to not taking medication. 8.  Hypothyroid uncontrolled due to not taking Synthroid.

## 2017-02-22 NOTE — Patient Instructions (Signed)
You MUST take all medications as prescribed.

## 2017-03-02 ENCOUNTER — Encounter: Payer: Self-pay | Admitting: Podiatry

## 2017-03-02 ENCOUNTER — Ambulatory Visit (INDEPENDENT_AMBULATORY_CARE_PROVIDER_SITE_OTHER): Payer: Medicare Other | Admitting: Podiatry

## 2017-03-02 DIAGNOSIS — B351 Tinea unguium: Secondary | ICD-10-CM | POA: Diagnosis not present

## 2017-03-02 DIAGNOSIS — M79671 Pain in right foot: Secondary | ICD-10-CM | POA: Diagnosis not present

## 2017-03-02 DIAGNOSIS — M79672 Pain in left foot: Secondary | ICD-10-CM

## 2017-03-02 NOTE — Progress Notes (Signed)
BS 202 Subjective: 83 y.o. year old female patient presents complaining of painful nails. Patient requests toe nails, corns and calluses trimmed. Her blood sugar was 202 this morning.  Objective: Dermatologic: Thick yellow deformed nails with yellow debris. Vascular: Pedal pulses are all palpable. Orthopedic: No gross deformities. Neurologic: All epicritic and tactile sensations grossly intact.  Assessment: Dystrophic mycotic nails x 10. Pain in both feet.  Treatment: All mycotic nails debrided.  Return in 3 months or as needed.

## 2017-03-02 NOTE — Patient Instructions (Signed)
Seen for hypertrophic nails. All nails debrided. Return in 3 months or as needed.  

## 2017-03-31 ENCOUNTER — Ambulatory Visit (INDEPENDENT_AMBULATORY_CARE_PROVIDER_SITE_OTHER): Payer: Medicare Other | Admitting: Podiatry

## 2017-03-31 ENCOUNTER — Encounter: Payer: Self-pay | Admitting: Podiatry

## 2017-03-31 DIAGNOSIS — M79672 Pain in left foot: Secondary | ICD-10-CM | POA: Diagnosis not present

## 2017-03-31 DIAGNOSIS — L600 Ingrowing nail: Secondary | ICD-10-CM | POA: Diagnosis not present

## 2017-03-31 DIAGNOSIS — B351 Tinea unguium: Secondary | ICD-10-CM | POA: Diagnosis not present

## 2017-03-31 DIAGNOSIS — M79671 Pain in right foot: Secondary | ICD-10-CM

## 2017-03-31 NOTE — Progress Notes (Signed)
  Subjective: 83 y.o. year old female patient presents complaining of painful toes 3rd and 4th left foot.  Was seen 03/02/17. Stated that her last blood sugar level was 372 yesterday. She will see her PCP this Friday.  Objective: Dermatologic: Thick yellow deformed nails with ingrown nail border 4th left foot. Vascular: Pedal pulses are all palpable. Orthopedic: No gross deformities. Neurologic: All epicritic and tactile sensations grossly intact.  Assessment: Abnormal nail growth with pain in toes 3rd and 4th left.  Treatment: Painful nails debrided. All other offending nail borders debrided. Pain relieved.

## 2017-03-31 NOTE — Patient Instructions (Signed)
Seen for painful nails. All nails debrided. Return in 3 months or as needed.

## 2017-04-13 ENCOUNTER — Ambulatory Visit: Payer: Medicare Other | Admitting: Internal Medicine

## 2017-04-15 IMAGING — CT CT HEAD W/O CM
3 of 4 series · 13 of 47 positions shown, 15 images · non-contrast
Comparison: 02/15/2016

CLINICAL DATA: Nausea and headaches

EXAM:
CT HEAD WITHOUT CONTRAST
TECHNIQUE: Contiguous axial images were obtained from the base of the skull
through the vertex without intravenous contrast.

[Series 3: head without · axial · non-contrast · 0.45mm/px · z∈[-94,+26]mm · 7 of 33 slices shown, 9 images]
[im 5/33  brain]
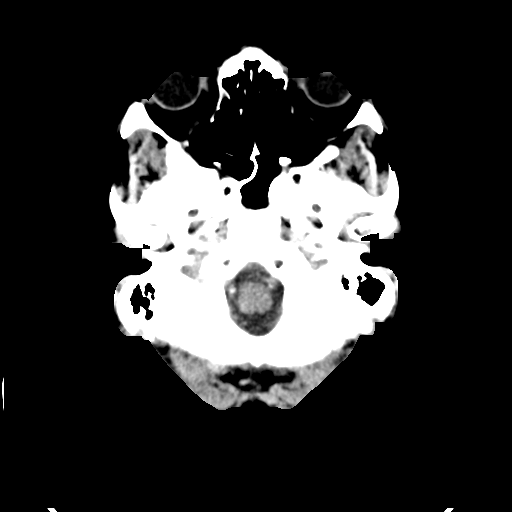
[im 5/33  bone]
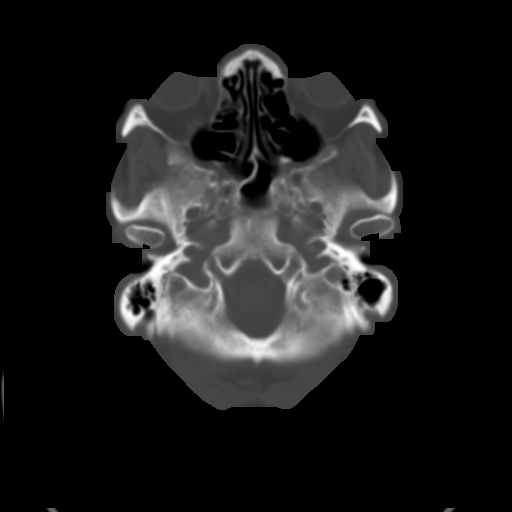
[im 9/33  brain]
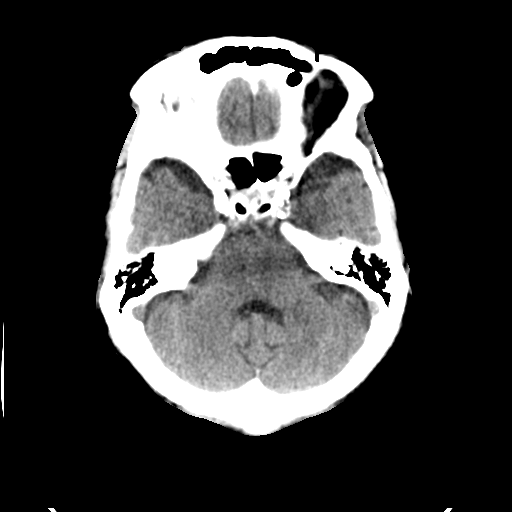
[im 13/33  brain]
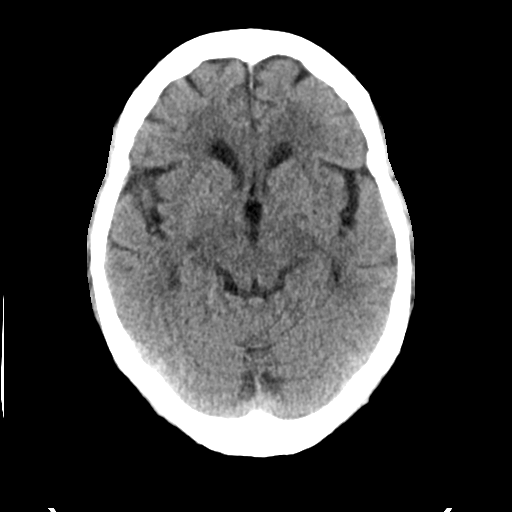
[im 17/33  brain]
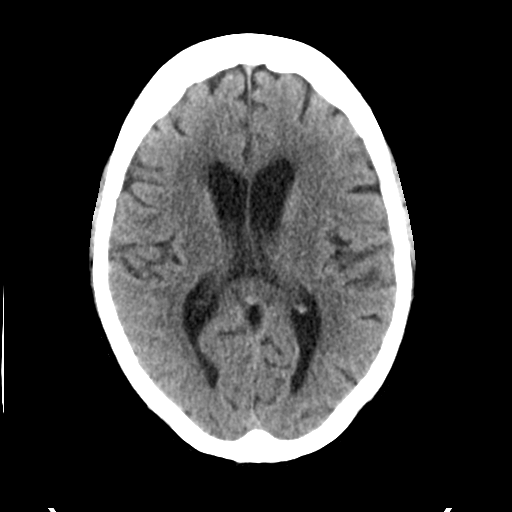
[im 21/33  brain]
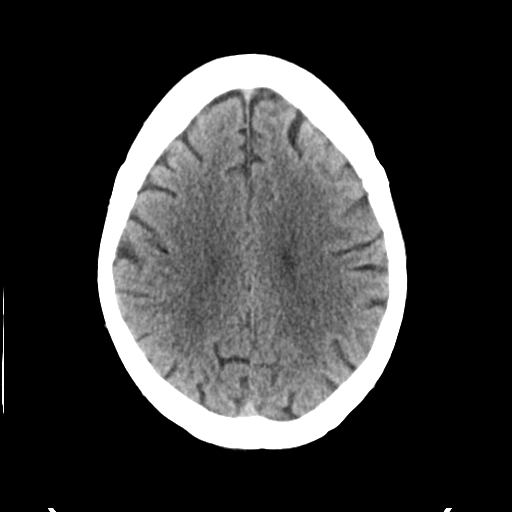
[im 21/33  bone]
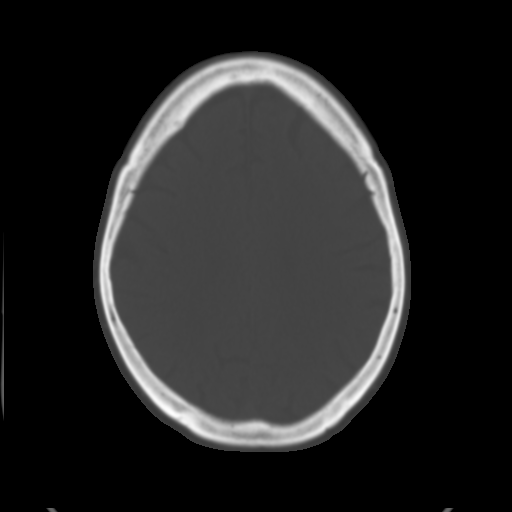
[im 25/33  brain]
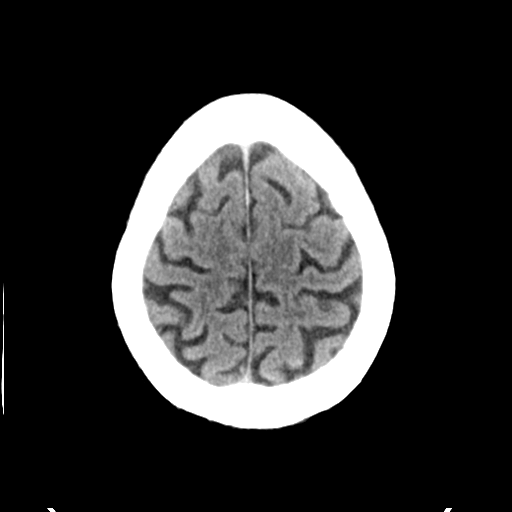
[im 29/33  brain]
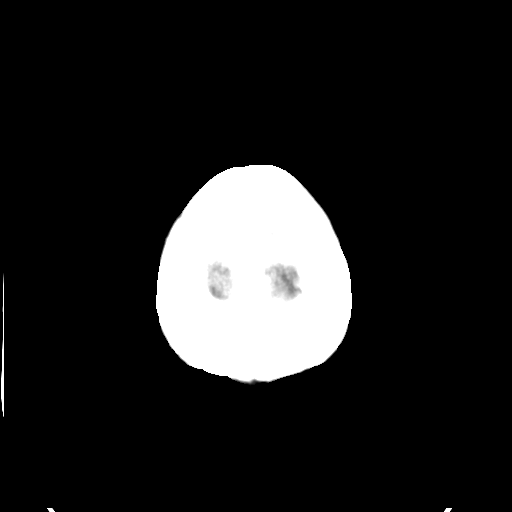

[Series 5: head without cor · coronal · non-contrast · 0.32mm/px · 3 of 73 slices shown]
[im 25/73  brain]
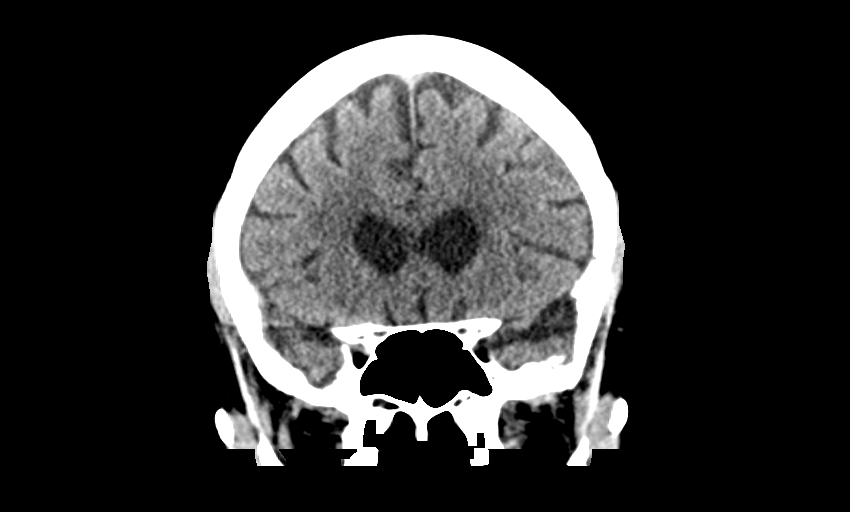
[im 33/73  brain]
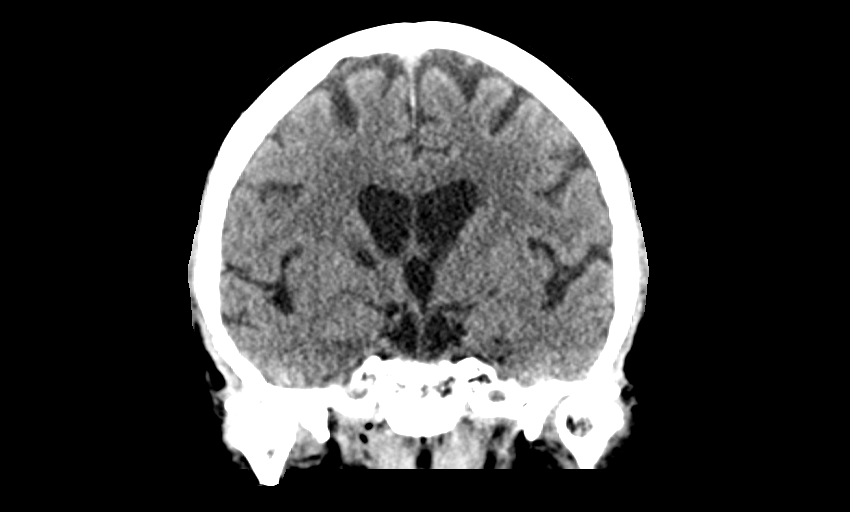
[im 41/73  brain]
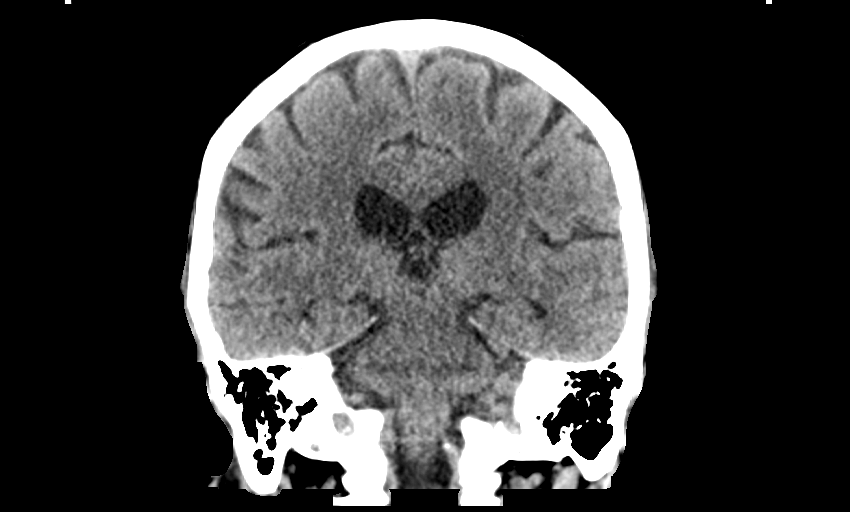

[Series 6: head without sag · sagittal · non-contrast · 0.32mm/px · 3 of 65 slices shown]
[im 22/65  brain]
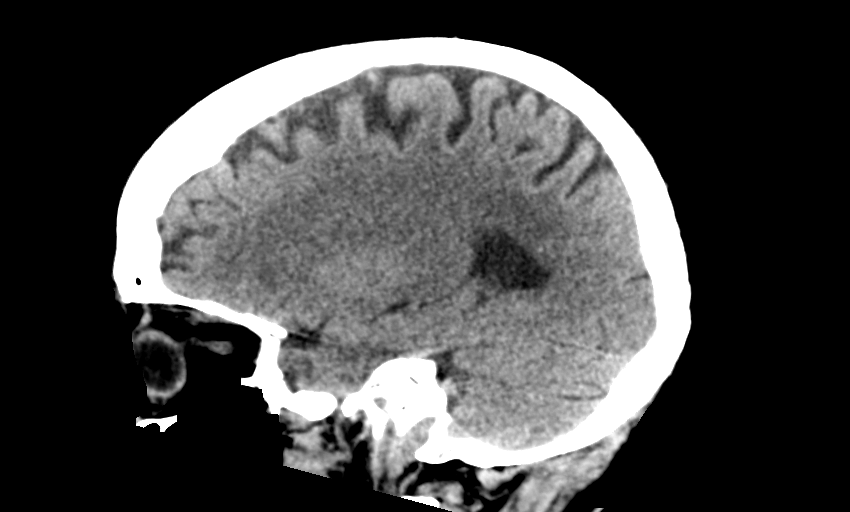
[im 33/65  brain]
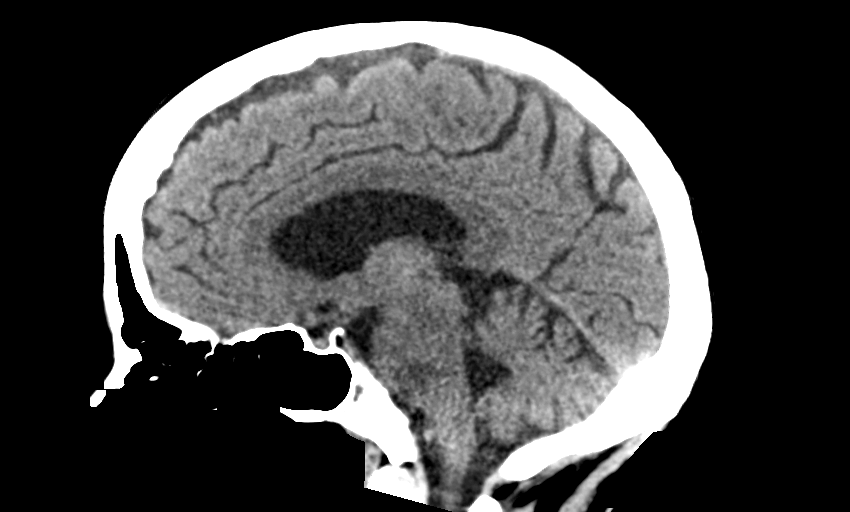
[im 43/65  brain]
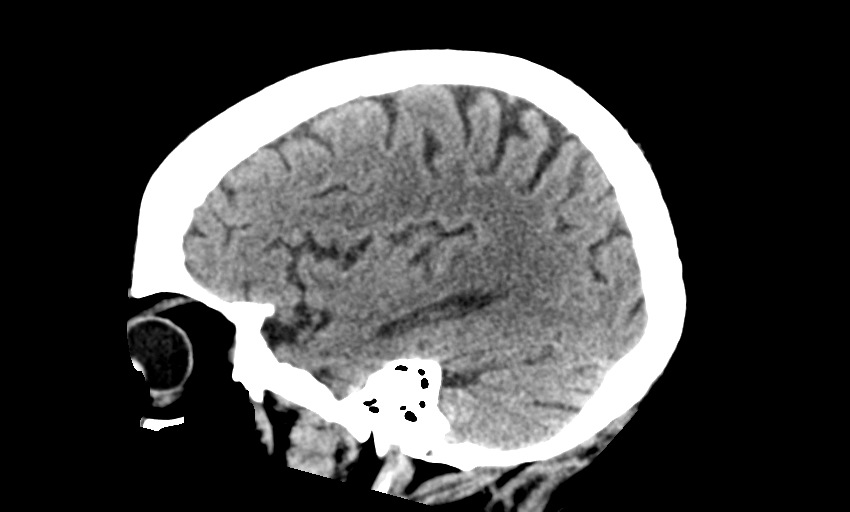

[13 of 47 positions shown; findings below may reference images not displayed]

FINDINGS: Brain: Stable lacunar infarct is noted within the internal capsule
on the right. No acute infarct, acute hemorrhage or space-occupying
mass lesion is noted.

Vascular: No hyperdense vessel or unexpected calcification.

Skull: Normal. Negative for fracture or focal lesion.

Sinuses/Orbits: No acute finding.

Other: None.
IMPRESSION: Chronic ischemic changes without acute abnormality.

## 2017-04-28 ENCOUNTER — Telehealth: Payer: Self-pay | Admitting: Internal Medicine

## 2017-05-05 NOTE — Addendum Note (Signed)
Addended by: Logan Bores on: 05/05/2017 04:48 PM   Modules accepted: Orders

## 2017-05-07 NOTE — Telephone Encounter (Signed)
Made in error

## 2017-05-19 ENCOUNTER — Encounter: Payer: Self-pay | Admitting: Internal Medicine

## 2017-05-19 ENCOUNTER — Ambulatory Visit: Payer: Medicare Other | Admitting: Internal Medicine

## 2017-05-21 ENCOUNTER — Ambulatory Visit: Payer: Medicare Other | Admitting: Internal Medicine

## 2017-05-26 ENCOUNTER — Ambulatory Visit (INDEPENDENT_AMBULATORY_CARE_PROVIDER_SITE_OTHER): Payer: Medicare Other | Admitting: Internal Medicine

## 2017-05-26 ENCOUNTER — Encounter: Payer: Self-pay | Admitting: Internal Medicine

## 2017-05-26 VITALS — BP 142/68 | HR 87 | Temp 97.9°F | Resp 18 | Ht 64.0 in | Wt 148.4 lb

## 2017-05-26 DIAGNOSIS — Z9114 Patient's other noncompliance with medication regimen: Secondary | ICD-10-CM

## 2017-05-26 DIAGNOSIS — H539 Unspecified visual disturbance: Secondary | ICD-10-CM | POA: Diagnosis not present

## 2017-05-26 DIAGNOSIS — I10 Essential (primary) hypertension: Secondary | ICD-10-CM

## 2017-05-26 DIAGNOSIS — Z8673 Personal history of transient ischemic attack (TIA), and cerebral infarction without residual deficits: Secondary | ICD-10-CM

## 2017-05-26 DIAGNOSIS — Z794 Long term (current) use of insulin: Secondary | ICD-10-CM

## 2017-05-26 DIAGNOSIS — F0150 Vascular dementia without behavioral disturbance: Secondary | ICD-10-CM | POA: Diagnosis not present

## 2017-05-26 DIAGNOSIS — G3281 Cerebellar ataxia in diseases classified elsewhere: Secondary | ICD-10-CM | POA: Diagnosis not present

## 2017-05-26 DIAGNOSIS — Z91148 Patient's other noncompliance with medication regimen for other reason: Secondary | ICD-10-CM

## 2017-05-26 DIAGNOSIS — E034 Atrophy of thyroid (acquired): Secondary | ICD-10-CM | POA: Diagnosis not present

## 2017-05-26 DIAGNOSIS — E1165 Type 2 diabetes mellitus with hyperglycemia: Secondary | ICD-10-CM | POA: Diagnosis not present

## 2017-05-26 MED ORDER — DONEPEZIL HCL 5 MG PO TABS: 5.0000 mg | ORAL_TABLET | Freq: Every day | ORAL | 6 refills | Status: DC

## 2017-05-26 NOTE — Patient Instructions (Addendum)
NO DRIVING!!  [place]  Will call with lab/CT results  Resume all medications as ordered  START GENERIC [place]  Follow up in 1 month for dementia, DM, HTN, thyroid or sooner if need be.

## 2017-05-26 NOTE — Progress Notes (Signed)
Patient ID: Allison [name], female   DOB: 01-May-1934, 83 y.o.   MRN: [id number]   Location:  [facility] OFFICE  Provider: DR Arletha Grippe  Code Status:  Goals of Care:  Advanced Directives 09/21/2016  Does Patient Have a Medical Advance Directive? No  Would patient like information on creating a medical advance directive? -     Chief Complaint  Patient presents with  . Medical Management of Chronic Issues    2 month follow up for DM, HTN, Hyperlipidemia and memory loss. Patient mentioned that she has not been taking her medication. Patient's son mentioned that he has some concerns about the amount of units she's using.   . Medication Refill    No refills needed at this time   . Health Maintenance    Patient declined the Tetanus/Tdap     HPI: Patient is a 83 y.o. female seen today for medical management of chronic diseases.  Sons are c/a pt's declining health over th last several months. She refuses to take meds, has poor hygiene and attempts to drive. She went and renewed her driver's license recently. She does not bathe on a regular basis. Pt's sister lives in home with her and allows her to eat ice cream/sherbert and drink Pepsis. She goes out to eat every day. She leaves home with sister and stays gone for several hours. When she returns she simply sleeps in her recliner. Pt reports urinary leakage and that she is "leaning to one side". She has visual problems but has not seen eye provider in some time. She is noncompliant with all meds and picks and chooses which ones she will take from day to day. Vision is worse and she notes depth perception issues.  She is a poor historian due to dementia. Hx obtained from chart.  HTN - BP stable on chlorthalidone and losartan. She checks BP at home.  Hyperlipidemia - uncontrolled. LDL 225(prev 130l);Tchol 307/HDL 50. Not taking lipitor. No myalgias.  hypothyroidism - uncontrolled. Unsure if she is taking levothyroxine.TSH 12.94; T4 free 0.9. (+)  cold intolerance, increased anxiety/depression, palpitations. She gained 4 lbs since mid April 2018  DM - uncontrolled. A1c 10.2%. BS elevated most days. She is supposed to be taking metformin and novolog 70/30 - 24 units BID. Numbness in fingers. She saw eye specialist in Feb 2017. No diabetic changes seen. 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 Uses magnifying glass to read at times. She reported angioedema with lantus and levemir. She does not take gabapentin for neuropathy and losartan for renal protection on a consistent basis. LDL 225; Tchol 307; HDL 50.   GERD - stable on pantoprazole  Anxiety - uncontrolled. Off lorazepam. She is not consistent with citalopram   Hx CVA with slurred speech/LLE hemiparesis (dx Oct 2017 - right internal capsule lacunar infarct) - not taking plavix daily. She has some memory deficit  Vascular dementia - weight down 8 lbs since Dec 2018.   Past Medical History:  Diagnosis Date  . Carpal tunnel syndrome   . Diabetes mellitus without complication ([place])   . Dysphagia   . GERD (gastroesophageal reflux disease)   . Hiatal hernia   . HLD (hyperlipidemia)   . Hx of adenomatous colonic polyps 06/19/2014  . Hypertension   . Hypothyroidism   . Onychomycosis 03/05/2012  . Pain in lower limb 04/18/2013  . Sciatic nerve pain   . Vertigo     Past Surgical History:  Procedure Laterality Date  . BLADDER SURGERY  Mesh implant  . COLONOSCOPY    . LARYNX SURGERY     vocal cords  . PARTIAL HYSTERECTOMY  1978  . VESICOVAGINAL FISTULA CLOSURE W/ TAH  1976     reports that she has never smoked. She has never used smokeless tobacco. She reports that she does not drink alcohol or use drugs. Social History   Socioeconomic History  . Marital status: Single    Spouse name: Not on file  . Number of children: 2  . Years of education: Not on file  . Highest education level: Not on file  Occupational History  . Occupation: Retired  Scientific laboratory technician  . Financial resource strain: Not  on file  . Food insecurity:    Worry: Not on file    Inability: Not on file  . Transportation needs:    Medical: Not on file    Non-medical: Not on file  Tobacco Use  . Smoking status: Never Smoker  . Smokeless tobacco: Never Used  Substance and Sexual Activity  . Alcohol use: No    Alcohol/week: 0.0 oz  . Drug use: No  . Sexual activity: Never    Birth control/protection: Abstinence  Lifestyle  . Physical activity:    Days per week: Not on file    Minutes per session: Not on file  . Stress: Not on file  Relationships  . Social connections:    Talks on phone: Not on file    Gets together: Not on file    Attends religious service: Not on file    Active member of club or organization: Not on file    Attends meetings of clubs or organizations: Not on file    Relationship status: Not on file  . Intimate partner violence:    Fear of current or ex partner: Not on file    Emotionally abused: Not on file    Physically abused: Not on file    Forced sexual activity: Not on file  Other Topics Concern  . Not on file  Social History Narrative   Single   2 sons   Retired   2 cups caffeine daily   05/01/2014    Family History  Problem Relation Age of Onset  . Alzheimer's disease Mother   . Glaucoma Mother   . Heart disease Father   . Alzheimer's disease Father   . Asthma Sister   . Allergies Sister   . Allergies Sister   . Sleep apnea Sister   . Heart disease Sister   . Breast cancer Maternal Aunt   . Pancreatic cancer Maternal Aunt   . Prostate cancer Maternal Uncle   . Prostate cancer Paternal Uncle   . Alcoholism Maternal Uncle   . Kidney disease Maternal Aunt   . Heart disease Maternal Aunt     Allergies  Allergen Reactions  . Lantus [Insulin Glargine] Itching and Swelling    angioedema  . Levemir [Insulin Detemir] Itching and Swelling    angioedema  . Pineapple Anaphylaxis  . Codeine Other (See Comments)    unknown  . Invokana [Canagliflozin] Other (See  Comments)    Constipation, nightmares  . Penicillins Other (See Comments)    Unknown   . Tradjenta [Linagliptin] Other (See Comments)  . Vioxx [Rofecoxib] Other (See Comments)    unknown    Outpatient Encounter Medications as of 05/26/2017  Medication Sig  . amLODipine (NORVASC) 10 MG tablet Take 1 tablet by mouth daily for blood pressure in the morning  . atorvastatin (  LIPITOR) 40 MG tablet Take 1 tablet (40 mg total) by mouth at bedtime.  . chlorthalidone (HYGROTON) 25 MG tablet Take 0.5 tablets (12.5 mg total) by mouth daily in the morning  . citalopram (CELEXA) 10 MG tablet Take 1 tablet (10 mg total) by mouth daily. For anxiety in the morning  . clopidogrel (PLAVIX) 75 MG tablet Take 1 tablet (75 mg total) by mouth daily in the morning  . gabapentin (NEURONTIN) 100 MG capsule Take 2 capsules (200 mg total) by mouth 3 (three) times daily as needed (nerve pain).  Marland Kitchen glucose blood (ONETOUCH VERIO) test strip Check blood sugar three times daily DX E11.21  . insulin aspart protamine - aspart (NOVOLOG MIX 70/30 FLEXPEN) (70-30) 100 UNIT/ML FlexPen Inject 24 units into skin twice daily before meals . Dx E11.9  . levothyroxine (SYNTHROID, LEVOTHROID) 150 MCG tablet take 1 tablet by mouth every morning ON AN EMPTY STOMACH for THROID in the morning  . losartan (COZAAR) 100 MG tablet Take 1 tablet (100 mg total) by mouth daily in the morning  . meclizine (ANTIVERT) 25 MG tablet Take 1 tablet (25 mg total) by mouth 3 (three) times daily as needed for dizziness.  . metFORMIN (GLUCOPHAGE) 500 MG tablet take 2 tablets by mouth twice a day with food  . ONETOUCH DELICA LANCETS 38U MISC Use to test blood sugar three times daily. Dx: E11.21 Patient needs an appointment before anymore future refills.  . pantoprazole (PROTONIX) 40 MG tablet Take 1 tablet (40 mg total) by mouth daily in the morning  . vitamin E (VITAMIN E) 1000 UNIT capsule Take 1,000 Units by mouth daily.   No facility-administered  encounter medications on file as of 05/26/2017.     Review of Systems:  Review of Systems  Unable to perform ROS: Dementia    Health Maintenance  Topic Date Due  . OPHTHALMOLOGY EXAM  12/30/1944  . TETANUS/TDAP  06/12/2013  . HEMOGLOBIN A1C  06/09/2017  . INFLUENZA VACCINE  08/12/2017  . FOOT EXAM  02/12/2018  . DEXA SCAN  Completed  . PNA vac Low Risk Adult  Completed    Physical Exam: Vitals:   05/26/17 1126  BP: (!) 142/68  Pulse: 87  Resp: 18  Temp: 97.9 F (36.6 C)  TempSrc: Oral  SpO2: 96%  Weight: 148 lb 6.4 oz (67.3 kg)  Height: '5\' 4"'$  (1.626 m)   Body mass index is 25.47 kg/m. Physical Exam  Constitutional: She appears well-developed and well-nourished.  HENT:  Mouth/Throat: Oropharynx is clear and moist. No oropharyngeal exudate.  MMM; no oral thrush  Eyes: Pupils are equal, round, and reactive to light. No scleral icterus.  Neck: Neck supple. Carotid bruit is not present. No tracheal deviation present. No thyromegaly present.  Cardiovascular: Normal rate, regular rhythm and intact distal pulses. Exam reveals no gallop and no friction rub.  Murmur (1/6 SEM) heard. No LE edema b/l. no calf TTP.   Pulmonary/Chest: Effort normal and breath sounds normal. No stridor. No respiratory distress. She has no wheezes. She has no rales.  Abdominal: Soft. Normal appearance and bowel sounds are normal. She exhibits no distension and no mass. There is no hepatomegaly. There is no tenderness. There is no rigidity, no rebound and no guarding. No hernia.  Musculoskeletal: She exhibits edema.  Lymphadenopathy:    She has no cervical adenopathy.  Neurological: She is alert. She has normal reflexes. Gait (unsteady) abnormal.  Left hemiparesis  Skin: Skin is warm and dry. No rash noted.  Psychiatric: She has a normal mood and affect. Her behavior is normal. Thought content normal. Her speech is slurred.    Labs reviewed: Basic Metabolic Panel: Recent Labs    09/17/16 1129   12/10/16 1008 12/22/16 2206 02/12/17 1059  NA 138   < > 138 140 141  K 4.3   < > 4.0 3.0* 3.5  CL 100   < > 101 99* 106  CO2 29   < > '29 31 30  '$ GLUCOSE 263*   < > 311* 214* 216*  BUN 9   < > '11 6 7  '$ CREATININE 0.78   < > 0.87 0.80 0.74  CALCIUM 9.8   < > 9.6 9.6 9.2  TSH 14.56*  --  15.51*  --  12.94*   < > = values in this interval not displayed.   Liver Function Tests: Recent Labs    06/10/16 0907 09/17/16 1129 12/10/16 1008 12/22/16 2206  AST '15 13 11 19  '$ ALT '11 12 10 14  '$ ALKPHOS 69  --   --  72  BILITOT 0.8 0.7 0.5 0.9  PROT 7.0 7.0 6.9 7.3  ALBUMIN 4.0  --   --  4.1   Recent Labs    12/22/16 2206  LIPASE 20   No results for input(s): AMMONIA in the last 8760 hours. CBC: Recent Labs    11/10/16 1852 12/22/16 2206 02/12/17 1059  WBC 8.2 13.4* 7.0  NEUTROABS  --  10.7* 3,864  HGB 13.1 14.0 13.1  HCT 39.6 40.8 38.8  MCV 81.6 80.2 80.3  PLT 246 253 235   Lipid Panel: Recent Labs    06/10/16 0907 12/10/16 1008  CHOL 236* 307*  HDL 53 50*  LDLCALC 165* 225*  TRIG 89 155*  CHOLHDL 4.5 6.1*   Lab Results  Component Value Date   HGBA1C 10.2 (H) 12/10/2016    Procedures since last visit: No results found.  Assessment/Plan   ICD-10-CM   1. Cerebellar ataxia in diseases classified elsewhere ([place]) G32.81 CT Head Wo Contrast  2. History of stroke Z86.73 Ambulatory referral to [place]  3. Vascular dementia without behavioral disturbance F01.50 donepezil (ARICEPT) 5 MG tablet    Ambulatory referral to [place]  4. Hypothyroidism due to acquired atrophy of thyroid E03.4 CBC (no diff)    TSH    T4, Free    Ambulatory referral to [place]  5. Uncontrolled type 2 diabetes mellitus with hyperglycemia, with long-term current use of insulin (HCC) E11.65 CMP with eGFR([facility])   Z79.4 Lipid Panel    CBC (no diff)    Hemoglobin A1c    Ambulatory referral to Ophthalmology    Ambulatory referral to Home Health  6. Essential hypertension I10 CBC (no  diff)    Ambulatory referral to Home Health  7. Change in vision H53.9 Ambulatory referral to Ophthalmology  8. Nonadherence to medication Z91.14 Ambulatory referral to [place]!! - will contact DMV regarding license restriction. Pt unsafe to drive under any circumstance due to dementia, uncontrolled DM, HTN, change in vision  [place]  Will call with lab/CT results  Resume all medications as ordered  START GENERIC [place]  Follow up in 1 month for dementia, DM, HTN, thyroid or sooner if need be.  Tanaia Hawkey S. Perlie Gold  [facility] [address] [place], [place] [address] [phone] Cell (  Monday-Friday 8 AM - 5 PM) [phone] After 5 PM and follow prompts

## 2017-05-27 ENCOUNTER — Telehealth: Payer: Self-pay

## 2017-05-27 DIAGNOSIS — E034 Atrophy of thyroid (acquired): Secondary | ICD-10-CM

## 2017-05-27 DIAGNOSIS — K219 Gastro-esophageal reflux disease without esophagitis: Secondary | ICD-10-CM

## 2017-05-27 LAB — COMPLETE METABOLIC PANEL WITH GFR
AG Ratio: 1.4 (calc) (ref 1.0–2.5)
ALKALINE PHOSPHATASE (APISO): 72 U/L (ref 33–130)
ALT: 11 U/L (ref 6–29)
AST: 13 U/L (ref 10–35)
Albumin: 4 g/dL (ref 3.6–5.1)
BUN: 9 mg/dL (ref 7–25)
CALCIUM: 9.7 mg/dL (ref 8.6–10.4)
CO2: 31 mmol/L (ref 20–32)
CREATININE: 0.85 mg/dL (ref 0.60–0.88)
Chloride: 101 mmol/L (ref 98–110)
GFR, EST NON AFRICAN AMERICAN: 65 mL/min/{1.73_m2} (ref >=60)
GFR, Est African American: 75 mL/min/{1.73_m2} (ref >=60)
GLOBULIN: 2.8 g/dL (ref 1.9–3.7)
GLUCOSE: 324 mg/dL — AB (ref 65–99)
Potassium: 3.8 mmol/L (ref 3.5–5.3)
Sodium: 140 mmol/L (ref 135–146)
Total Bilirubin: 0.8 mg/dL (ref 0.2–1.2)
Total Protein: 6.8 g/dL (ref 6.1–8.1)

## 2017-05-27 LAB — CBC
HCT: 39.8 % (ref 35.0–45.0)
Hemoglobin: 13.4 g/dL (ref 11.7–15.5)
MCH: 27.1 pg (ref 27.0–33.0)
MCHC: 33.7 g/dL (ref 32.0–36.0)
MCV: 80.4 fL (ref 80.0–100.0)
MPV: 10.9 fL (ref 7.5–12.5)
PLATELETS: 249 10*3/uL (ref 140–400)
RBC: 4.95 10*6/uL (ref 3.80–5.10)
RDW: 12.9 % (ref 11.0–15.0)
WBC: 7.1 10*3/uL (ref 3.8–10.8)

## 2017-05-27 LAB — HEMOGLOBIN A1C
HEMOGLOBIN A1C: 13.1 %{Hb} — AB (ref <5.7)
MEAN PLASMA GLUCOSE: 329 (calc)
eAG (mmol/L): 18.2 (calc)

## 2017-05-27 LAB — LIPID PANEL
CHOL/HDL RATIO: 4.9 (calc) (ref <5.0)
Cholesterol: 276 mg/dL — ABNORMAL HIGH (ref <200)
HDL: 56 mg/dL (ref >50)
LDL CHOLESTEROL (CALC): 197 mg/dL — AB
Non-HDL Cholesterol (Calc): 220 mg/dL (calc) — ABNORMAL HIGH (ref <130)
TRIGLYCERIDES: 103 mg/dL (ref <150)

## 2017-05-27 LAB — TSH: TSH: 11.17 m[IU]/L — AB (ref 0.40–4.50)

## 2017-05-27 LAB — T4, FREE: FREE T4: 1 ng/dL (ref 0.8–1.8)

## 2017-05-27 NOTE — Telephone Encounter (Signed)
Spoke with patient's son Nadara Mode was not certain of which medications patient is taking and if they need refills. [name] states the pillpack was shown to [place] yesterday at appointment.  [name] states " Let's start from scratch and refill all medications."  Dr.Carter please advise   1.) What is the indication for Plavix and Chlorthalidone ( I need to add this to patient's instructions) 2.) Advise if ok to fill insulin with the noted allergy/contridication warning

## 2017-05-27 NOTE — Telephone Encounter (Signed)
Please call one of her sons with her lab results. She is unable to make decisions for herself in regards to her medications and medical treatment due to dementia. Please let her son(s) know the results and ask them whether or not she needs refills. Thanks

## 2017-05-27 NOTE — Telephone Encounter (Addendum)
Discussed results with patient, patient verbalized understanding of results  I questioned if patient was out of medications for some of them indicate that she would have ran out in January or February . Patient states she does not know any of her medications from the other and does not know what any of them are for. Patient asked that I send whatever she needs to [facility], patient also asked that I indicate what the medication is for.  Based on fill history patient is out of several medications (some more than 4-5 months) Chlorthalidone  Amlodipine  Plavix Synthroid Lipitor Pantoprazole Celexa Losartan  Insulin  Dr.Carter please advise diagnosis for Plavix, and Chlorthalidone, I was given several options/reasons medication can be prescribed   Please also review warning for insulin (pending medications)

## 2017-05-27 NOTE — Telephone Encounter (Signed)
-----   Message from [place], [place] sent at 05/27/2017 10:12 AM EDT ----- As expected, all labs uncontrolled - diabetes, thyroid, cholesterol - PLEASE RESUME ALL MEDICATIONS AS ORDERED; follow up as scheduled; watch fatty foods

## 2017-05-28 NOTE — Telephone Encounter (Signed)
Per last note - plavix for stroke; chlorthalidone for HTN  Ok to RF novolog flexpen insulin as written (she has allergy to lantus and levemir)

## 2017-06-01 ENCOUNTER — Encounter: Payer: Self-pay | Admitting: Podiatry

## 2017-06-01 ENCOUNTER — Ambulatory Visit (INDEPENDENT_AMBULATORY_CARE_PROVIDER_SITE_OTHER): Payer: Medicare Other | Admitting: Podiatry

## 2017-06-01 DIAGNOSIS — M79671 Pain in right foot: Secondary | ICD-10-CM | POA: Diagnosis not present

## 2017-06-01 DIAGNOSIS — L600 Ingrowing nail: Secondary | ICD-10-CM

## 2017-06-01 DIAGNOSIS — M79672 Pain in left foot: Secondary | ICD-10-CM

## 2017-06-01 DIAGNOSIS — B351 Tinea unguium: Secondary | ICD-10-CM

## 2017-06-01 NOTE — Progress Notes (Signed)
Subjective: 83 y.o. year old female patient presents complaining of painful nails. Patient requests toe nails trimmed. Her blood sugar still stays at 300 level.  Fingers feel numb and toes are painful. Patient request diabetic shoes.  Objective: Dermatologic: Thick yellow deformed nails x 10. Vascular: Pedal pulses are all palpable. Orthopedic: Bunion deformity bilateral. Neurologic: Painful feet.  Assessment: Dystrophic mycotic nails x 10. Diabetic Neuropathy. Bunion deformity bilateral.  Treatment: All mycotic nails debrided.  Patient is to return with signed PCP certification form to order diabetic shoes. Return in 3 months or sooner if needed.

## 2017-06-01 NOTE — Patient Instructions (Signed)
Seen for hypertrophic nails. All nails debrided. Return in 3 months or as needed.  

## 2017-06-02 ENCOUNTER — Other Ambulatory Visit: Payer: Self-pay | Admitting: *Deleted

## 2017-06-02 DIAGNOSIS — E1165 Type 2 diabetes mellitus with hyperglycemia: Secondary | ICD-10-CM | POA: Diagnosis not present

## 2017-06-02 DIAGNOSIS — F0150 Vascular dementia without behavioral disturbance: Secondary | ICD-10-CM | POA: Diagnosis not present

## 2017-06-02 DIAGNOSIS — I69354 Hemiplegia and hemiparesis following cerebral infarction affecting left non-dominant side: Secondary | ICD-10-CM | POA: Diagnosis not present

## 2017-06-02 DIAGNOSIS — I10 Essential (primary) hypertension: Secondary | ICD-10-CM | POA: Diagnosis not present

## 2017-06-02 DIAGNOSIS — Z9181 History of falling: Secondary | ICD-10-CM | POA: Diagnosis not present

## 2017-06-02 DIAGNOSIS — F419 Anxiety disorder, unspecified: Secondary | ICD-10-CM | POA: Diagnosis not present

## 2017-06-02 DIAGNOSIS — Z794 Long term (current) use of insulin: Secondary | ICD-10-CM | POA: Diagnosis not present

## 2017-06-02 DIAGNOSIS — I69315 Cognitive social or emotional deficit following cerebral infarction: Secondary | ICD-10-CM | POA: Diagnosis not present

## 2017-06-02 MED ORDER — GLUCOSE BLOOD VI STRP: ORAL_STRIP | 11 refills | Status: DC

## 2017-06-02 MED ORDER — CLOPIDOGREL BISULFATE 75 MG PO TABS: ORAL_TABLET | ORAL | 5 refills | Status: DC

## 2017-06-02 MED ORDER — ONETOUCH DELICA LANCETS 33G MISC: 11 refills | Status: DC

## 2017-06-02 MED ORDER — AMLODIPINE BESYLATE 10 MG PO TABS: ORAL_TABLET | ORAL | 5 refills | Status: DC

## 2017-06-02 MED ORDER — CHLORTHALIDONE 25 MG PO TABS: ORAL_TABLET | ORAL | 1 refills | Status: DC

## 2017-06-02 MED ORDER — LOSARTAN POTASSIUM 100 MG PO TABS: ORAL_TABLET | ORAL | 5 refills | Status: DC

## 2017-06-02 MED ORDER — PANTOPRAZOLE SODIUM 40 MG PO TBEC: DELAYED_RELEASE_TABLET | ORAL | 5 refills | Status: DC

## 2017-06-02 MED ORDER — LEVOTHYROXINE SODIUM 150 MCG PO TABS: ORAL_TABLET | ORAL | 5 refills | Status: DC

## 2017-06-02 MED ORDER — CITALOPRAM HYDROBROMIDE 10 MG PO TABS: ORAL_TABLET | ORAL | 5 refills | Status: DC

## 2017-06-02 MED ORDER — ATORVASTATIN CALCIUM 40 MG PO TABS: 40.0000 mg | ORAL_TABLET | Freq: Every day | ORAL | 5 refills | Status: DC

## 2017-06-02 MED ORDER — INSULIN ASPART PROT & ASPART (70-30 MIX) 100 UNIT/ML PEN: PEN_INJECTOR | SUBCUTANEOUS | 5 refills | Status: DC

## 2017-06-02 NOTE — Telephone Encounter (Signed)
RXs sent.

## 2017-06-02 NOTE — Telephone Encounter (Signed)
Allison [name] with [facility] called and requested Rx to be sent to Pharmacy for patient's One Touch Verio IQ suppies. Faxed.

## 2017-06-08 ENCOUNTER — Other Ambulatory Visit: Payer: Self-pay

## 2017-06-08 NOTE — Patient Outreach (Signed)
[facility]) Care Management  06/08/2017  JANEAN EISCHEN 1934/03/13 [id number]  TELEPHONE SCREENING Referral date: 05/25/17 Referral source: primary MD.  Referral reason: needs assistance taking medications Insurance: Medicare  Telephone call to patient's son/designated party release Manley Mason  regarding primary MD referral. HIPAA verified with patient. Explained reason for call. Son states patient is not taking her medications regularly.  States patient is also not taking her insulin like she should.  Son states patient reports the medication makes her feel funny.  Son states when patient does not take her medication as she should she has urinary incontinence and her disposition changes.  Son states patient lives with family who assist with her care.  States the family is making sure she checks her blood sugars.  RNCM discussed and offered [facility] care management services. Son verbally agreed to [facility] pharmacist services. Denied nursing services at this time.  RNCM gave son name and contact phone number for [facility] care management and 24 hour nurse advise line.   PLAN: RNCM will refer patient to [facility] pharmacist.  RNCM will send patient [facility] welcome packet/ consent.   Quinn Plowman RN,BSN,CCM [facility] Telephonic  [phone]

## 2017-06-08 NOTE — Patient Outreach (Signed)
[place] [facility]) Care Management  06/08/2017  WESTLYN GLAZA 1934-07-19 [id number]    TELEPHONE SCREENING Referral date: 05/25/17 Referral source: primary MD referral Referral reason: needs assistance with taking her medications.  Insurance: Medicare Attempt #1    Telephone call to patient regarding utilization management referral. Unable to reach patient. Or leave voice mail message. Message states, patients voice mail box has not been set up.  Attempted alternate number. Number rang busy.     PLAN: RNCM will attempt 2nd telephone call to patient within 4 business days. RNCM will send outreach letter.   Quinn Plowman RN,BSN,CCM [facility] Telephonic  [phone]

## 2017-06-09 ENCOUNTER — Other Ambulatory Visit: Payer: Self-pay | Admitting: Pharmacist

## 2017-06-09 DIAGNOSIS — Z794 Long term (current) use of insulin: Secondary | ICD-10-CM | POA: Diagnosis not present

## 2017-06-09 DIAGNOSIS — I10 Essential (primary) hypertension: Secondary | ICD-10-CM | POA: Diagnosis not present

## 2017-06-09 DIAGNOSIS — F0150 Vascular dementia without behavioral disturbance: Secondary | ICD-10-CM | POA: Diagnosis not present

## 2017-06-09 DIAGNOSIS — E1165 Type 2 diabetes mellitus with hyperglycemia: Secondary | ICD-10-CM | POA: Diagnosis not present

## 2017-06-09 DIAGNOSIS — I69354 Hemiplegia and hemiparesis following cerebral infarction affecting left non-dominant side: Secondary | ICD-10-CM | POA: Diagnosis not present

## 2017-06-09 DIAGNOSIS — I69315 Cognitive social or emotional deficit following cerebral infarction: Secondary | ICD-10-CM | POA: Diagnosis not present

## 2017-06-09 NOTE — Patient Outreach (Addendum)
[facility]) Care Management  06/09/2017  Allison [name] [birth year] [id number]   83 year old female referred to [place] [facility] by PCP, Dr. Eulas [name].  [place] services requested for medication management and medication adherence.  Patient having difficulty with side effects and remembering to take medications. Per notes, primary contact is patient's son, Allison [name]. PMHx includes, but not limited to, memory loss, vision loss, hypothyroidism, HTN, HLD, type 2 diabetes mellitus, hx CVA, GERD, IBS, PVD, and bilateral carpal tunnel syndrome.   Unsuccessful call to patient's son, Allison [name], on his home and cell phone numbers listed in [facility].  No voicemail set up to leave a message.   Successful call to patient's home phone.  Patient answered and HIPAA verified.  Patient agreeable to [facility] services.  She reports that her other son, Allison [name], also assists with medications and that I can reach out to either Allison [name] or Allison [name], with medication questions.  Patient currently using compliance packs from [facility] but this pharmacy has closed down.  Patient did pick up recent new [facility], [facility].  Patient and son, Allison [name], would like a face-to-face visit to review medications.   Plan: I will visit with patient and family on Friday, May 31st, at 1:00 PM for medication reconciliation.   Allison [name], PharmD, [place] [phone]      Addendum: Incoming call from son, Allison [name].  Patient has CT appointment on Friday morning and would like to reschedule appointment to next Monday, June 3rd at 1:00 PM.

## 2017-06-10 DIAGNOSIS — I69315 Cognitive social or emotional deficit following cerebral infarction: Secondary | ICD-10-CM | POA: Diagnosis not present

## 2017-06-10 DIAGNOSIS — I69354 Hemiplegia and hemiparesis following cerebral infarction affecting left non-dominant side: Secondary | ICD-10-CM | POA: Diagnosis not present

## 2017-06-10 DIAGNOSIS — F0150 Vascular dementia without behavioral disturbance: Secondary | ICD-10-CM | POA: Diagnosis not present

## 2017-06-10 DIAGNOSIS — E1165 Type 2 diabetes mellitus with hyperglycemia: Secondary | ICD-10-CM | POA: Diagnosis not present

## 2017-06-10 DIAGNOSIS — I10 Essential (primary) hypertension: Secondary | ICD-10-CM | POA: Diagnosis not present

## 2017-06-10 DIAGNOSIS — Z794 Long term (current) use of insulin: Secondary | ICD-10-CM | POA: Diagnosis not present

## 2017-06-11 ENCOUNTER — Ambulatory Visit
Admission: RE | Admit: 2017-06-11 | Discharge: 2017-06-11 | Disposition: A | Discharge status: Home / Self Care | Payer: Medicare Other | Source: Ambulatory Visit | Attending: Internal Medicine | Admitting: Internal Medicine

## 2017-06-11 ENCOUNTER — Ambulatory Visit: Payer: Self-pay | Admitting: Pharmacist

## 2017-06-11 DIAGNOSIS — Z8673 Personal history of transient ischemic attack (TIA), and cerebral infarction without residual deficits: Secondary | ICD-10-CM | POA: Diagnosis not present

## 2017-06-11 DIAGNOSIS — R270 Ataxia, unspecified: Secondary | ICD-10-CM | POA: Diagnosis not present

## 2017-06-11 DIAGNOSIS — G3281 Cerebellar ataxia in diseases classified elsewhere: Secondary | ICD-10-CM

## 2017-06-11 NOTE — Telephone Encounter (Signed)
This encounter was created in error - please disregard.

## 2017-06-12 ENCOUNTER — Other Ambulatory Visit: Payer: Self-pay | Admitting: Internal Medicine

## 2017-06-14 ENCOUNTER — Other Ambulatory Visit: Payer: Self-pay | Admitting: Pharmacist

## 2017-06-14 DIAGNOSIS — Z794 Long term (current) use of insulin: Secondary | ICD-10-CM | POA: Diagnosis not present

## 2017-06-14 DIAGNOSIS — F0150 Vascular dementia without behavioral disturbance: Secondary | ICD-10-CM | POA: Diagnosis not present

## 2017-06-14 DIAGNOSIS — I69315 Cognitive social or emotional deficit following cerebral infarction: Secondary | ICD-10-CM | POA: Diagnosis not present

## 2017-06-14 DIAGNOSIS — E1165 Type 2 diabetes mellitus with hyperglycemia: Secondary | ICD-10-CM | POA: Diagnosis not present

## 2017-06-14 DIAGNOSIS — I10 Essential (primary) hypertension: Secondary | ICD-10-CM | POA: Diagnosis not present

## 2017-06-14 DIAGNOSIS — I69354 Hemiplegia and hemiparesis following cerebral infarction affecting left non-dominant side: Secondary | ICD-10-CM | POA: Diagnosis not present

## 2017-06-14 NOTE — Patient Outreach (Signed)
[facility]) Care Management  [place]   06/15/2017  Allison [name] 07/31/1934 [id number]   83 year old female referred to [place] [facility] by PCP, Dr. Eulas [name].  [place] services requested for medication management and medication adherence.  Patient having difficulty with side effects and remembering to take medications. Per notes, primary contact is patient's son, Allison [name]. PMHx includes, but not limited to, memory loss, vision loss, hypothyroidism, HTN, HLD, type 2 diabetes mellitus, hx CVA, GERD, IBS, PVD, and bilateral carpal tunnel syndrome.   Subjective: Home visit with Allison [name] and son, Allison [name].  HIPAA identifiers verified. Allison [name] reports she has been having worsening urinary incontinence lately.  She reports she lost her glucometer and has been using an old one which she does not like.  She has no test strips, lancets, or pen needles in the home.  She states that she has a Marine scientist from [employer] [employer] coming 1-2x weekly.   She has 2 weeks left of compliance packs from [facility] which went out of business.  She would like to transfer prescriptions to a new pharmacy that can also provide compliance packs and delivery.  She reports Meals on Wheels will be starting soon.   Objective:  CBGs per glucometer:  300s-550s.   Hemoglobin A1c = 13.1 05/26/17 SCr = 0.85 5/15/'19  Lipid profile 05/26/17: TC: 276 HDL: 56 LDL: 197 TGs: 103  Encounter Medications: Outpatient Encounter Medications as of 06/14/2017  Medication Sig  . amLODipine (NORVASC) 10 MG tablet Take 1 tablet by mouth daily for blood pressure in the morning  . atorvastatin (LIPITOR) 40 MG tablet Take 1 tablet (40 mg total) by mouth at bedtime. For Cholesterol  . chlorthalidone (HYGROTON) 25 MG tablet Take 0.5 tablets (12.5 mg total) by mouth daily in the morning for High Blood Pressure  . citalopram (CELEXA) 10 MG tablet Take 1 tablet (10 mg total) by mouth daily For  anxiety in the morning  . clopidogrel (PLAVIX) 75 MG tablet Take 1 tablet (75 mg total) by mouth daily in the morning for history of stroke  . donepezil (ARICEPT) 5 MG tablet Take 1 tablet (5 mg total) by mouth at bedtime.  . gabapentin (NEURONTIN) 100 MG capsule Take 2 capsules (200 mg total) by mouth 3 (three) times daily as needed (nerve pain).  . insulin aspart protamine - aspart (NOVOLOG MIX 70/30 FLEXPEN) (70-30) 100 UNIT/ML FlexPen Inject 24 units into skin twice daily before meals . Dx E11.9  . levothyroxine (SYNTHROID, LEVOTHROID) 150 MCG tablet take 1 tablet by mouth every morning ON AN EMPTY STOMACH for THROID in the morning  . losartan (COZAAR) 100 MG tablet Take 1 tablet (100 mg total) by mouth daily in the morning for High Blood Pressure  . metFORMIN (GLUCOPHAGE) 500 MG tablet take 2 tablets by mouth twice a day with food  . pantoprazole (PROTONIX) 40 MG tablet Take 1 tablet (40 mg total) by mouth daily in the morning  . [DISCONTINUED] glucose blood (ONETOUCH VERIO) test strip Check blood sugar three times daily DX E11.21 (One Touch Verio IQ)  . [DISCONTINUED] ONETOUCH DELICA LANCETS 24M MISC Use to test blood sugar three times daily. Dx: E11.21  . meclizine (ANTIVERT) 25 MG tablet Take 1 tablet (25 mg total) by mouth 3 (three) times daily as needed for dizziness. (Patient not taking: Reported on 06/14/2017)  . vitamin E (VITAMIN E) 1000 UNIT capsule Take 1,000 Units by mouth daily.   No facility-administered  encounter medications on file as of 06/14/2017.     Functional Status: No flowsheet data found.  Fall/Depression Screening: Fall Risk  09/21/2016 06/10/2016 06/03/2016  Falls in the past year? No No No  Number falls in past yr: - - -  Injury with Fall? - - -  Comment - - -  Risk Factor Category  - - -  Risk for fall due to : - - -  Risk for fall due to: Comment - - -  Follow up - - -   PHQ 2/9 Scores 06/10/2016 05/14/2016 05/14/2016 04/27/2016 01/23/2016 11/21/2015 10/08/2014  PHQ  - 2 Score 2 5 5 6  0 0 0  PHQ- 9 Score 4 11 10 12  - - -    Assessment:  Drugs sorted by system:  Neurologic/Psychologic: citalopram, donepezil, meclizine  Cardiovascular: Amlodipine, atorvastatin, chlorthalidone, clopidogrel, losartan  Pulmonary/Allergy:  Gastrointestinal: pantoprazole  Endocrine:Novolog 70/30, levothyroxine, metformin  Pain: gabapentin  Vitamins/Minerals: Vitamin E  Duplications in therapy:  Gaps in therapy:  Medications to avoid in the elderly:  Drug interactions:   Other issues noted:  Medication adherence: Patient not taking medications consistently from compliance packs per review.  Patient has missed 50% of doses from last week.  She has 2 weeks supply left in packs and will need prescriptions transferred to new pharmacy to continue this service. Family agreeable to use [facility].    Call placed to [facility] during visit to request all medications be transferred.   I reviewed importance of taking medications and encouraged patient to be consistent with time of day that she takes medications.     Insulin administration:  Patient has not been pushing all the insulin from the pen during administration, pulling out pen too quickly, and using expired pens.  I reviewed storage instructions with patient and son that once punctured, pen should stay at room temperature and is only good for 2 weeks.  Unopened pens should remain in refrigerator.  We discarded of the empty and punctured pens as these are likely past expiration.  2 remaining pens should last patient ~12-13 days.    I reviewed insulin administration technique with patient including rolling the pen 10x horizontally in hands to mix the insulins, proper aseptic technique, and administration. Patient able to demonstrate ability to use insulin pen properly during visit and voiced understanding about how to use it.    [facility] will fill pen needles today if active  prescription transferred.  Family will drive to pick it up once it is ready.    Diabetic testing supplies: Patient does not have a functional glucometer or testing supplies in the home.    Call placed to Dr. Vale Haven office to request new prescriptions be sent to [facility].    Diabetic Diet: Patient eats out at restaurants for most meals and is not following a diabetic diet.  We reviewed good food choices and portions.  Patient is agreeable to a telephonic health coach referral for diabetes education.    Medication Insurance and Assistance:  Patient currently as Development worker, community PDP and Full Extra Help as verified through [employer].  Patient states she is able to afford all her medications currently.   Plan: I will follow-up with pharmacy and patient later this week to ensure all medications are able to be filled and delivered correctly to patient.   I will make [facility] health coach referral for diabetic teaching.   Ralene Bathe, PharmD, [place]  [phone]     

## 2017-06-15 ENCOUNTER — Other Ambulatory Visit: Payer: Self-pay | Admitting: Pharmacist

## 2017-06-15 ENCOUNTER — Other Ambulatory Visit: Payer: Self-pay | Admitting: *Deleted

## 2017-06-15 DIAGNOSIS — IMO0002 Reserved for concepts with insufficient information to code with codable children: Secondary | ICD-10-CM

## 2017-06-15 DIAGNOSIS — E1121 Type 2 diabetes mellitus with diabetic nephropathy: Secondary | ICD-10-CM

## 2017-06-15 DIAGNOSIS — E1165 Type 2 diabetes mellitus with hyperglycemia: Principal | ICD-10-CM

## 2017-06-15 MED ORDER — GLUCOSE BLOOD VI STRP: ORAL_STRIP | 11 refills | Status: DC

## 2017-06-15 MED ORDER — GABAPENTIN 100 MG PO CAPS: 200.0000 mg | ORAL_CAPSULE | Freq: Three times a day (TID) | ORAL | 5 refills | Status: DC | PRN

## 2017-06-15 MED ORDER — INSULIN ASPART PROT & ASPART (70-30 MIX) 100 UNIT/ML PEN: PEN_INJECTOR | SUBCUTANEOUS | 5 refills | Status: DC

## 2017-06-15 MED ORDER — METFORMIN HCL 500 MG PO TABS: ORAL_TABLET | ORAL | 5 refills | Status: DC

## 2017-06-15 MED ORDER — ONETOUCH DELICA LANCETS 33G MISC: 11 refills | Status: DC

## 2017-06-15 MED ORDER — CLOPIDOGREL BISULFATE 75 MG PO TABS: ORAL_TABLET | ORAL | 5 refills | Status: DC

## 2017-06-15 MED ORDER — ONETOUCH VERIO W/DEVICE KIT: 1.0000 | PACK | Freq: Three times a day (TID) | 0 refills | Status: DC

## 2017-06-15 MED ORDER — PEN NEEDLES 31G X 5 MM MISC: 1.0000 | 3 refills | Status: DC | PRN

## 2017-06-15 NOTE — Telephone Encounter (Signed)
Colleen with [facility] called and stated that patient misplaced her Glucometer and needed a new one sent to [facility]. Used One Probation officer. Faxed.

## 2017-06-15 NOTE — Telephone Encounter (Signed)
Colleen with [facility] called and stated that patient needed refills faxed to [facility]. Faxed.

## 2017-06-15 NOTE — Patient Outreach (Signed)
[facility] [facility]) Care Management  06/15/2017  Allison [name] [birth year] [id number]   Incoming call from [facility]. No new glucometer prescription sent in yet.  [facility] no refills on the following:  -Clopidogrel -Novolog 70/30 -Pen needles -Metformin -Gabapentin  2nd call placed to Dr. Vale Haven office to request glucometer prescription, new prescriptions as above, and call back from RN.   I routed my note from yesterday to Dr. Eulas Post and CMA also requesting new medications be sent in as above and glucometer.   Plan: I will follow-up regarding compliance packs and glucometer again tomorrow.   Ralene Bathe, PharmD, [place] [phone]

## 2017-06-16 ENCOUNTER — Telehealth: Payer: Self-pay | Admitting: *Deleted

## 2017-06-16 NOTE — Telephone Encounter (Signed)
[name], son called and stated that patient is not acting right. Very anxious because "bags" were taken away and she wants them back. Son stated that patient is in his room wanting the bags. Son stated she was very worked up about not having her "bags" that the son and [facility] nurse took away. Son gave the bags back to her and she calmed down. Son stated that patient has not taken any of her medications in 2 days. Stated that patient just took her Blood Sugar and it was 396. Son feels like she is going to take her medications now that she has her Bags back. Son is keeping a close eye on her to be sure she takes them. Stated that if any new symptoms occur or worsens he will call us back. Stated that patient is calm now.

## 2017-06-16 NOTE — Telephone Encounter (Signed)
noted 

## 2017-06-17 ENCOUNTER — Other Ambulatory Visit: Payer: Self-pay | Admitting: Pharmacist

## 2017-06-17 DIAGNOSIS — I69315 Cognitive social or emotional deficit following cerebral infarction: Secondary | ICD-10-CM | POA: Diagnosis not present

## 2017-06-17 DIAGNOSIS — Z794 Long term (current) use of insulin: Secondary | ICD-10-CM | POA: Diagnosis not present

## 2017-06-17 DIAGNOSIS — I10 Essential (primary) hypertension: Secondary | ICD-10-CM | POA: Diagnosis not present

## 2017-06-17 DIAGNOSIS — E1165 Type 2 diabetes mellitus with hyperglycemia: Secondary | ICD-10-CM | POA: Diagnosis not present

## 2017-06-17 DIAGNOSIS — I69354 Hemiplegia and hemiparesis following cerebral infarction affecting left non-dominant side: Secondary | ICD-10-CM | POA: Diagnosis not present

## 2017-06-17 DIAGNOSIS — F0150 Vascular dementia without behavioral disturbance: Secondary | ICD-10-CM | POA: Diagnosis not present

## 2017-06-17 NOTE — Patient Outreach (Signed)
[place] [facility]) Care Management  06/17/2017  Allison [name] January 01, 1935 [id number]  Care coordination call to [facility].  All medications received from Dr. Eulas Post except glucometer.  Pillpacks will be delivered starting June 20th.  Pen needle prescription is ready.   Call placed to Allison [name]. HIPAA identifiers verified. Patient reports she is doing better with insulin.  She states that she found more old compliance packs from [facility] enough to last "10 years."  A nurse is at the home during my phone call and confirms that patient only has 2 weeks left, which is the same as what I had found during my home visit earlier this week.  RN states patient skipped her insulin this morning but took it this afternoon after her CBG = 456.  Son, Allison [name], agreeable for compliance packs to start on June 20th.  Patient will need reinforced education and reminders with taking medications.    Plan: I will request another glucometer RX from Dr. Eulas Post and follow-up with patient in 2 weeks once new compliance packs are delivered.   Ralene Bathe, PharmD, [place] [phone]

## 2017-06-21 ENCOUNTER — Ambulatory Visit: Payer: Medicare Other

## 2017-06-21 ENCOUNTER — Ambulatory Visit: Payer: Medicare Other | Admitting: Nurse Practitioner

## 2017-06-22 DIAGNOSIS — E1165 Type 2 diabetes mellitus with hyperglycemia: Secondary | ICD-10-CM | POA: Diagnosis not present

## 2017-06-22 DIAGNOSIS — I10 Essential (primary) hypertension: Secondary | ICD-10-CM | POA: Diagnosis not present

## 2017-06-22 DIAGNOSIS — I69354 Hemiplegia and hemiparesis following cerebral infarction affecting left non-dominant side: Secondary | ICD-10-CM | POA: Diagnosis not present

## 2017-06-22 DIAGNOSIS — F0150 Vascular dementia without behavioral disturbance: Secondary | ICD-10-CM | POA: Diagnosis not present

## 2017-06-22 DIAGNOSIS — Z794 Long term (current) use of insulin: Secondary | ICD-10-CM | POA: Diagnosis not present

## 2017-06-22 DIAGNOSIS — I69315 Cognitive social or emotional deficit following cerebral infarction: Secondary | ICD-10-CM | POA: Diagnosis not present

## 2017-06-24 DIAGNOSIS — Z794 Long term (current) use of insulin: Secondary | ICD-10-CM | POA: Diagnosis not present

## 2017-06-24 DIAGNOSIS — I10 Essential (primary) hypertension: Secondary | ICD-10-CM | POA: Diagnosis not present

## 2017-06-24 DIAGNOSIS — E1165 Type 2 diabetes mellitus with hyperglycemia: Secondary | ICD-10-CM | POA: Diagnosis not present

## 2017-06-24 DIAGNOSIS — I69354 Hemiplegia and hemiparesis following cerebral infarction affecting left non-dominant side: Secondary | ICD-10-CM | POA: Diagnosis not present

## 2017-06-24 DIAGNOSIS — F0150 Vascular dementia without behavioral disturbance: Secondary | ICD-10-CM | POA: Diagnosis not present

## 2017-06-24 DIAGNOSIS — I69315 Cognitive social or emotional deficit following cerebral infarction: Secondary | ICD-10-CM | POA: Diagnosis not present

## 2017-06-28 ENCOUNTER — Ambulatory Visit (INDEPENDENT_AMBULATORY_CARE_PROVIDER_SITE_OTHER): Payer: Medicare Other

## 2017-06-28 VITALS — BP 116/64 | HR 92 | Temp 98.2°F | Ht 64.0 in | Wt 146.0 lb

## 2017-06-28 DIAGNOSIS — Z0000 Encounter for general adult medical examination without abnormal findings: Secondary | ICD-10-CM

## 2017-06-28 DIAGNOSIS — F0150 Vascular dementia without behavioral disturbance: Secondary | ICD-10-CM | POA: Diagnosis not present

## 2017-06-28 DIAGNOSIS — Z794 Long term (current) use of insulin: Secondary | ICD-10-CM | POA: Diagnosis not present

## 2017-06-28 DIAGNOSIS — I69315 Cognitive social or emotional deficit following cerebral infarction: Secondary | ICD-10-CM | POA: Diagnosis not present

## 2017-06-28 DIAGNOSIS — I69354 Hemiplegia and hemiparesis following cerebral infarction affecting left non-dominant side: Secondary | ICD-10-CM | POA: Diagnosis not present

## 2017-06-28 DIAGNOSIS — I10 Essential (primary) hypertension: Secondary | ICD-10-CM | POA: Diagnosis not present

## 2017-06-28 DIAGNOSIS — E1165 Type 2 diabetes mellitus with hyperglycemia: Secondary | ICD-10-CM | POA: Diagnosis not present

## 2017-06-28 MED ORDER — ZOSTER VAC RECOMB ADJUVANTED 50 MCG/0.5ML IM SUSR: 0.5000 mL | Freq: Once | INTRAMUSCULAR | 1 refills | Status: AC

## 2017-06-28 MED ORDER — TETANUS-DIPHTH-ACELL PERTUSSIS 5-2.5-18.5 LF-MCG/0.5 IM SUSP: 0.5000 mL | Freq: Once | INTRAMUSCULAR | 0 refills | Status: AC

## 2017-06-28 NOTE — Progress Notes (Signed)
Subjective:   Allison [name] is a 83 y.o. female who presents for Medicare Annual (Subsequent) preventive examination.  Last AWV-06/10/2016    Objective:     Vitals: BP 116/64 (BP Location: Left Arm, Patient Position: Sitting)   Pulse 92   Temp 98.2 F (36.8 C) (Oral)   Ht _0  (1.626 m)   Wt 146 lb (66.2 kg)   SpO2 98%   BMI 25.06 kg/m   Body mass index is 25.06 kg/m.  Advanced Directives 06/28/2017 06/08/2017 09/21/2016 06/21/2016 06/15/2016 06/10/2016 05/14/2016  Does Patient Have a Medical Advance Directive? _1  No No  Would patient like information on creating a medical advance directive? Yes (MAU/Ambulatory/Procedural Areas - Information given) No - Patient declined - - - Yes (MAU/Ambulatory/Procedural Areas - Information given) No - Patient declined    Tobacco Social History   Tobacco Use  Smoking Status Never Smoker  Smokeless Tobacco Never Used     Counseling given: Not Answered   Clinical Intake:  Pre-visit preparation completed: No  Pain : No/denies pain     Nutritional Risks: None Diabetes: Yes CBG done?: No Did pt. bring in CBG monitor from home?: No  How often do you need to have someone help you when you read instructions, pamphlets, or other written materials from your doctor or pharmacy?: 1 - Never What is the last grade level you completed in school?: COllege  Interpreter Needed?: No  Information entered by :: [name], RN  Past Medical History:  Diagnosis Date  . Carpal tunnel syndrome   . Diabetes mellitus without complication ([place])   . Dysphagia   . GERD (gastroesophageal reflux disease)   . Hiatal hernia   . HLD (hyperlipidemia)   . Hx of adenomatous colonic polyps 06/19/2014  . Hypertension   . Hypothyroidism   . Onychomycosis 03/05/2012  . Pain in lower limb 04/18/2013  . Sciatic nerve pain   . Vertigo    Past Surgical History:  Procedure Laterality Date  . BLADDER SURGERY     Mesh implant  . COLONOSCOPY    .  LARYNX SURGERY     vocal cords  . PARTIAL HYSTERECTOMY  1978  . VESICOVAGINAL FISTULA CLOSURE W/ TAH  1976   Family History  Problem Relation Age of Onset  . Alzheimer's disease Mother   . Glaucoma Mother   . Heart disease Father   . Alzheimer's disease Father   . Asthma Sister   . Allergies Sister   . Allergies Sister   . Sleep apnea Sister   . Heart disease Sister   . Breast cancer Maternal Aunt   . Pancreatic cancer Maternal Aunt   . Prostate cancer Maternal Uncle   . Prostate cancer Paternal Uncle   . Alcoholism Maternal Uncle   . Kidney disease Maternal Aunt   . Heart disease Maternal Aunt    Social History   Socioeconomic History  . Marital status: Single    Spouse name: Not on file  . Number of children: 2  . Years of education: Not on file  . Highest education level: Not on file  Occupational History  . Occupation: Retired  Scientific laboratory technician  . Financial resource strain: Not hard at all  . Food insecurity:    Worry: Never true    Inability: Never true  . Transportation needs:    Medical: No    Non-medical: No  Tobacco Use  . Smoking status: Never Smoker  . Smokeless tobacco: Never  Used  Substance and Sexual Activity  . Alcohol use: No    Alcohol/week: 0.0 oz  . Drug use: No  . Sexual activity: Never    Birth control/protection: Abstinence  Lifestyle  . Physical activity:    Days per week: 2 days    Minutes per session: 20 min  . Stress: Rather much  Relationships  . Social connections:    Talks on phone: More than three times a week    Gets together: More than three times a week    Attends religious service: More than 4 times per year    Active member of club or organization: No    Attends meetings of clubs or organizations: Never    Relationship status: Never married  Other Topics Concern  . Not on file  Social History Narrative   Single   2 sons   Retired   2 cups caffeine daily   05/01/2014    Outpatient Encounter Medications as of  06/28/2017  Medication Sig  . amLODipine (NORVASC) 10 MG tablet Take 1 tablet by mouth daily for blood pressure in the morning  . atorvastatin (LIPITOR) 40 MG tablet Take 1 tablet (40 mg total) by mouth at bedtime. For Cholesterol  . Blood Glucose Monitoring Suppl (ONETOUCH VERIO) w/Device KIT 1 each by Does not apply route 3 (three) times daily. Use to test blood sugar three times daily. Dx: E11.21  . chlorthalidone (HYGROTON) 25 MG tablet Take 0.5 tablets (12.5 mg total) by mouth daily in the morning for High Blood Pressure  . citalopram (CELEXA) 10 MG tablet Take 1 tablet (10 mg total) by mouth daily For anxiety in the morning  . clopidogrel (PLAVIX) 75 MG tablet Take 1 tablet (75 mg total) by mouth daily in the morning for history of stroke  . donepezil (ARICEPT) 5 MG tablet Take 1 tablet (5 mg total) by mouth at bedtime.  . gabapentin (NEURONTIN) 100 MG capsule Take 2 capsules (200 mg total) by mouth 3 (three) times daily as needed (nerve pain).  Marland Kitchen glucose blood (ONETOUCH VERIO) test strip Check blood sugar three times daily DX E11.21 (One Touch Verio IQ)  . insulin aspart protamine - aspart (NOVOLOG MIX 70/30 FLEXPEN) (70-30) 100 UNIT/ML FlexPen Inject 24 units into skin twice daily before meals . Dx E11.9  . Insulin Pen Needle (PEN NEEDLES) 31G X 5 MM MISC 1 each by Does not apply route as needed. Use when giving insulin injections. Dx: E11.9  . levothyroxine (SYNTHROID, LEVOTHROID) 150 MCG tablet take 1 tablet by mouth every morning ON AN EMPTY STOMACH for THROID in the morning  . losartan (COZAAR) 100 MG tablet Take 1 tablet (100 mg total) by mouth daily in the morning for High Blood Pressure  . meclizine (ANTIVERT) 25 MG tablet Take 1 tablet (25 mg total) by mouth 3 (three) times daily as needed for dizziness.  . metFORMIN (GLUCOPHAGE) 500 MG tablet take 2 tablets by mouth twice a day with food  . ONETOUCH DELICA LANCETS 73A MISC Use to test blood sugar three times daily. Dx: E11.21  .  pantoprazole (PROTONIX) 40 MG tablet Take 1 tablet (40 mg total) by mouth daily in the morning  . Tdap (BOOSTRIX) 5-2.5-18.5 LF-MCG/0.5 injection Inject 0.5 mLs into the muscle once for 1 dose.  . vitamin E (VITAMIN E) 1000 UNIT capsule Take 1,000 Units by mouth daily.  Marland Kitchen Zoster Vaccine Adjuvanted [facility]) injection Inject 0.5 mLs into the muscle once for 1 dose.  . [DISCONTINUED]  Tdap (BOOSTRIX) 5-2.5-18.5 LF-MCG/0.5 injection Inject 0.5 mLs into the muscle once.  . [DISCONTINUED] Zoster Vaccine Adjuvanted [facility]) injection Inject 0.5 mLs into the muscle once.   No facility-administered encounter medications on file as of 06/28/2017.     Activities of Daily Living In your present state of health, do you have any difficulty performing the following activities: 06/28/2017  Hearing? N  Vision? N  Difficulty concentrating or making decisions? Y  Walking or climbing stairs? N  Dressing or bathing? N  Doing errands, shopping? N  Preparing Food and eating ? N  Using the Toilet? N  In the past six months, have you accidently leaked urine? Y  Comment wears pads  Do you have problems with loss of bowel control? N  Managing your Medications? Y  Managing your Finances? Y  Housekeeping or managing your Housekeeping? Y  Some recent data might be hidden    Patient Care Team: Gildardo Cranker, DO as PCP - General (Internal Medicine) Rudean Haskell, [facility] as [place] Management (Pharmacist) Lazaro Arms, RN as [place] Management    Assessment:   This is a routine wellness examination for [place].  Exercise Activities and Dietary recommendations Current Exercise Habits: Home exercise routine, Type of exercise: walking, Time (Minutes): 20, Frequency (Times/Week): 2, Weekly Exercise (Minutes/Week): 40, Intensity: Mild, Exercise limited by: None identified  Goals    . Decrease soda or juice intake by choosing diet soda instead    . Exercise 3x per  week (30 min per time)     Starting today (when silver sneakers happens) pt will go to the gym 3-4 X a week.    Marland Kitchen HEMOGLOBIN A1C < 7.0    . Reduce portion size of carbs to 3 servings per meal       Fall Risk Fall Risk  06/28/2017 09/21/2016 06/10/2016 06/03/2016 05/14/2016  Falls in the past year? No No No No Yes  Number falls in past yr: - - - - 2 or more  Injury with Fall? - - - - No  Comment - - - - -  Risk Factor Category  - - - - High Fall Risk  Risk for fall due to : - - - - History of fall(s);Impaired balance/gait;Impaired mobility  Risk for fall due to: Comment - - - - -  Follow up - - - - Education provided;Falls prevention discussed   Is the patient's home free of loose throw rugs in walkways, pet beds, electrical cords, etc?   yes      Grab bars in the bathroom? yes      Handrails on the stairs?   yes      Adequate lighting?   yes  Depression Screen PHQ 2/9 Scores 06/28/2017 06/10/2016 05/14/2016 05/14/2016  PHQ - 2 Score 0 _0 PHQ- 9 Score - _1 Cognitive Function MMSE - Mini Mental State Exam 06/28/2017 05/11/2016 04/28/2016 12/05/2013  Orientation to time _2 Orientation to Place _3 Registration _4 Attention/ Calculation _5 Recall _6 Language- name 2 objects _7 Language- repeat _8 Language- follow 3 step command _9 Language- read & follow direction _10 Write a sentence _11 Copy design _12 Total score  _0 Immunization History  Administered Date(s) Administered  . Influenza Whole 09/13/2011  . Influenza,inj,Quad PF,6+ Mos 12/05/2013, 12/31/2014, 11/15/2015, 02/12/2017  . Pneumococcal Conjugate-13 03/27/2014  . Pneumococcal Polysaccharide-23 04/28/2016  . Td 06/13/2003    Qualifies for Shingles Vaccine? Yes, educated and ordered to pharmacy  Screening Tests Health Maintenance  Topic Date Due  . OPHTHALMOLOGY EXAM  12/30/1944  . TETANUS/TDAP  06/12/2013  . INFLUENZA  VACCINE  08/12/2017  . HEMOGLOBIN A1C  11/26/2017  . FOOT EXAM  02/12/2018  . DEXA SCAN  Completed  . PNA vac Low Risk Adult  Completed    Cancer Screenings: Lung: Low Dose CT Chest recommended if Age 98-80 years, 30 pack-year currently smoking OR have quit w/in 15years. Patient does not qualify. Breast:  Up to date on Mammogram? Yes   Up to date of Bone Density/Dexa? Yes Colorectal: up to date  Additional Screenings:  Hepatitis C Screening: declined Diabetic eye exam scheduled in July 2019    Plan:    I have personally reviewed and addressed the Medicare Annual Wellness questionnaire and have noted the following in the patient's chart:  A. Medical and social history B. Use of alcohol, tobacco or illicit drugs  C. Current medications and supplements D. Functional ability and status E.  Nutritional status F.  Physical activity G. Advance directives H. List of other physicians I.  Hospitalizations, surgeries, and ER visits in previous 12 months J.  [place] to include hearing, vision, cognitive, depression L. Referrals and appointments - none  In addition, I have reviewed and discussed with patient certain preventive protocols, quality metrics, and best practice recommendations. A written personalized care plan for preventive services as well as general preventive health recommendations were provided to patient.  See attached scanned questionnaire for additional information.   Signed,   [name], RN Nurse Health Advisor  Patient Concerns: None

## 2017-06-28 NOTE — Patient Instructions (Addendum)
Allison [name] , Thank you for taking time to come for your Medicare Wellness Visit. I appreciate your ongoing commitment to your health goals. Please review the following plan we discussed and let me know if I can assist you in the future.   Screening recommendations/referrals: Colonoscopy excluded, over age 81 Mammogram excluded, over age 55 Bone Density up to date Recommended yearly ophthalmology/optometry visit for glaucoma screening and checkup Recommended yearly dental visit for hygiene and checkup  Vaccinations: Influenza vaccine up to date, due 2019 fall season Pneumococcal vaccine up to date, completed Tdap vaccine due, ordered to pharmacy Shingles vaccine due, ordered to pharmacy    Advanced directives: Advance directive discussed with you today. I have provided a copy for you to complete at home and have notarized. Once this is complete please bring a copy in to our office so we can scan it into your chart.  Conditions/risks identified: none  Next appointment: Dr. Eulas Post 06/29/2017 @ 9:15am            [name], RN 07/01/2018 @ 11:30am   Preventive Care 65 Years and Older, Female Preventive care refers to lifestyle choices and visits with your health care provider that can promote health and wellness. What does preventive care include?  A yearly physical exam. This is also called an annual well check.  Dental exams once or twice a year.  Routine eye exams. Ask your health care provider how often you should have your eyes checked.  Personal lifestyle choices, including:  Daily care of your teeth and gums.  Regular physical activity.  Eating a healthy diet.  Avoiding tobacco and drug use.  Limiting alcohol use.  Practicing safe sex.  Taking low-dose aspirin every day.  Taking vitamin and mineral supplements as recommended by your health care provider. What happens during an annual well check? The services and screenings done by your health care provider  during your annual well check will depend on your age, overall health, lifestyle risk factors, and family history of disease. Counseling  Your health care provider may ask you questions about your:  Alcohol use.  Tobacco use.  Drug use.  Emotional well-being.  Home and relationship well-being.  Sexual activity.  Eating habits.  History of falls.  Memory and ability to understand (cognition).  Work and work Statistician.  Reproductive health. Screening  You may have the following tests or measurements:  Height, weight, and BMI.  Blood pressure.  Lipid and cholesterol levels. These may be checked every 5 years, or more frequently if you are over 58 years old.  Skin check.  Lung cancer screening. You may have this screening every year starting at age 5 if you have a 30-pack-year history of smoking and currently smoke or have quit within the past 15 years.  Fecal occult blood test (FOBT) of the stool. You may have this test every year starting at age 35.  Flexible sigmoidoscopy or colonoscopy. You may have a sigmoidoscopy every 5 years or a colonoscopy every 10 years starting at age 28.  Hepatitis C blood test.  Hepatitis B blood test.  Sexually transmitted disease (STD) testing.  Diabetes screening. This is done by checking your blood sugar (glucose) after you have not eaten for a while (fasting). You may have this done every 1-3 years.  Bone density scan. This is done to screen for osteoporosis. You may have this done starting at age 26.  Mammogram. This may be done every 1-2 years. Talk to your health care provider  about how often you should have regular mammograms. Talk with your health care provider about your test results, treatment options, and if necessary, the need for more tests. Vaccines  Your health care provider may recommend certain vaccines, such as:  Influenza vaccine. This is recommended every year.  Tetanus, diphtheria, and acellular pertussis  (Tdap, Td) vaccine. You may need a Td booster every 10 years.  Zoster vaccine. You may need this after age 65.  Pneumococcal 13-valent conjugate (PCV13) vaccine. One dose is recommended after age 34.  Pneumococcal polysaccharide (PPSV23) vaccine. One dose is recommended after age 9. Talk to your health care provider about which screenings and vaccines you need and how often you need them. This information is not intended to replace advice given to you by your health care provider. Make sure you discuss any questions you have with your health care provider. Document Released: 01/25/2015 Document Revised: 09/18/2015 Document Reviewed: 10/30/2014 Elsevier Interactive Patient Education  2017 [place] Prevention in the Home Falls can cause injuries. They can happen to people of all ages. There are many things you can do to make your home safe and to help prevent falls. What can I do on the outside of my home?  Regularly fix the edges of walkways and driveways and fix any cracks.  Remove anything that might make you trip as you walk through a door, such as a raised step or threshold.  Trim any bushes or trees on the path to your home.  Use bright outdoor lighting.  Clear any walking paths of anything that might make someone trip, such as rocks or tools.  Regularly check to see if handrails are loose or broken. Make sure that both sides of any steps have handrails.  Any raised decks and porches should have guardrails on the edges.  Have any leaves, snow, or ice cleared regularly.  Use sand or salt on walking paths during winter.  Clean up any spills in your garage right away. This includes oil or grease spills. What can I do in the bathroom?  Use night lights.  Install grab bars by the toilet and in the tub and shower. Do not use towel bars as grab bars.  Use non-skid mats or decals in the tub or shower.  If you need to sit down in the shower, use a plastic, non-slip  stool.  Keep the floor dry. Clean up any water that spills on the floor as soon as it happens.  Remove soap buildup in the tub or shower regularly.  Attach bath mats securely with double-sided non-slip rug tape.  Do not have throw rugs and other things on the floor that can make you trip. What can I do in the bedroom?  Use night lights.  Make sure that you have a light by your bed that is easy to reach.  Do not use any sheets or blankets that are too big for your bed. They should not hang down onto the floor.  Have a firm chair that has side arms. You can use this for support while you get dressed.  Do not have throw rugs and other things on the floor that can make you trip. What can I do in the kitchen?  Clean up any spills right away.  Avoid walking on wet floors.  Keep items that you use a lot in easy-to-reach places.  If you need to reach something above you, use a strong step stool that has a grab bar.  Keep electrical cords out of the way.  Do not use floor polish or wax that makes floors slippery. If you must use wax, use non-skid floor wax.  Do not have throw rugs and other things on the floor that can make you trip. What can I do with my stairs?  Do not leave any items on the stairs.  Make sure that there are handrails on both sides of the stairs and use them. Fix handrails that are broken or loose. Make sure that handrails are as long as the stairways.  Check any carpeting to make sure that it is firmly attached to the stairs. Fix any carpet that is loose or worn.  Avoid having throw rugs at the top or bottom of the stairs. If you do have throw rugs, attach them to the floor with carpet tape.  Make sure that you have a light switch at the top of the stairs and the bottom of the stairs. If you do not have them, ask someone to add them for you. What else can I do to help prevent falls?  Wear shoes that:  Do not have high heels.  Have rubber bottoms.  Are  comfortable and fit you well.  Are closed at the toe. Do not wear sandals.  If you use a stepladder:  Make sure that it is fully opened. Do not climb a closed stepladder.  Make sure that both sides of the stepladder are locked into place.  Ask someone to hold it for you, if possible.  Clearly mark and make sure that you can see:  Any grab bars or handrails.  First and last steps.  Where the edge of each step is.  Use tools that help you move around (mobility aids) if they are needed. These include:  Canes.  Walkers.  Scooters.  Crutches.  Turn on the lights when you go into a dark area. Replace any light bulbs as soon as they burn out.  Set up your furniture so you have a clear path. Avoid moving your furniture around.  If any of your floors are uneven, fix them.  If there are any pets around you, be aware of where they are.  Review your medicines with your doctor. Some medicines can make you feel dizzy. This can increase your chance of falling. Ask your doctor what other things that you can do to help prevent falls. This information is not intended to replace advice given to you by your health care provider. Make sure you discuss any questions you have with your health care provider. Document Released: 10/25/2008 Document Revised: 06/06/2015 Document Reviewed: 02/02/2014 Elsevier Interactive Patient Education  2017 [employer].

## 2017-06-29 ENCOUNTER — Ambulatory Visit (INDEPENDENT_AMBULATORY_CARE_PROVIDER_SITE_OTHER): Payer: Medicare Other | Admitting: Internal Medicine

## 2017-06-29 ENCOUNTER — Other Ambulatory Visit: Payer: Self-pay

## 2017-06-29 ENCOUNTER — Encounter: Payer: Self-pay | Admitting: Internal Medicine

## 2017-06-29 VITALS — BP 144/80 | HR 79 | Temp 98.0°F | Ht 64.0 in | Wt 142.5 lb

## 2017-06-29 DIAGNOSIS — Z8673 Personal history of transient ischemic attack (TIA), and cerebral infarction without residual deficits: Secondary | ICD-10-CM

## 2017-06-29 DIAGNOSIS — Z794 Long term (current) use of insulin: Secondary | ICD-10-CM

## 2017-06-29 DIAGNOSIS — I10 Essential (primary) hypertension: Secondary | ICD-10-CM | POA: Diagnosis not present

## 2017-06-29 DIAGNOSIS — F0150 Vascular dementia without behavioral disturbance: Secondary | ICD-10-CM

## 2017-06-29 DIAGNOSIS — E1165 Type 2 diabetes mellitus with hyperglycemia: Secondary | ICD-10-CM | POA: Diagnosis not present

## 2017-06-29 MED ORDER — DONEPEZIL HCL 10 MG PO TABS: 10.0000 mg | ORAL_TABLET | Freq: Every day | ORAL | 6 refills | Status: DC

## 2017-06-29 NOTE — Patient Instructions (Signed)
INCREASE DONEPEZIL TO 10MG  AT BEDTIME  Continue other medications as ordered  TAKE ALL MEDICATIONS AS ORDERED EVERY DAY!  Follow up with specialists as scheduled  Check blood sugars at least 2 times daily and keep journal  NO DRIVING!!  Follow up in 1 month for dementia, DM and HTN

## 2017-06-29 NOTE — Progress Notes (Signed)
Patient ID: Allison [name], female   DOB: 1934-05-29, 83 y.o.   MRN: [id number]   Location:  [facility] OFFICE  Provider: DR Arletha Grippe  Code Status:  Goals of Care:  Advanced Directives 06/28/2017  Does Patient Have a Medical Advance Directive? No  Would patient like information on creating a medical advance directive? Yes (MAU/Ambulatory/Procedural Areas - Information given)     Chief Complaint  Patient presents with  . Medical Management of Chronic Issues    Patient here today with her son Allison [name], 1 month follow up on demetia, HTN and thyroid. Patient states her sugar has been running high,     HPI: Patient is a 82 y.o. female seen today for medical management of chronic diseases.  She continues to miss doses and the pill pack she brought to the office today still has several doses from different days that have not been taken. She does have a home care aid to assist with baths 3 times per week. She has trouble with grooming and does not eat consistency. She completed AWV yesterday and had dropped 2 additional lbs. Weight reading inaccurate today as she was holding onto the scale. She is a poor historian due to dementia. Hx obtained from chart and son, Allison [name].  HTN - BP improved; takes chlorthalidone and losartan. She checks BP at home.  Hyperlipidemia - improving. LDL 197(prev 225);Tchol 276/HDL 56. Not consistent with lipitor. No myalgias.  hypothyroidism - uncontrolled. Unsure if she is taking levothyroxine.TSH 12.94; T4 free 0.9. (+) cold intolerance, increased anxiety/depression, palpitations. She gained 4 lbs since mid April 2018  DM - uncontrolled. A1c 13.1% (prev 10.2%). BS elevated most days. She is supposed to be taking metformin and novolog 70/30 - 24 units BID. Numbness in fingers. She saw eye specialist in Feb 2017. No diabetic changes seen. 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 Uses magnifying glass to read at times. She has an appt with eye specialist in Jul 2019. She reported  angioedema with lantus and levemir. She takes gabapentin for neuropathy and losartan for renal protection. LDL 197; Tchol 276 (prev 307); HDL 56.   GERD - stable on pantoprazole  Anxiety -improved on citalopram   Hx CVA with slurred speech/LLE hemiparesis (dx Oct 2017 - right internal capsule lacunar infarct) - on plavix daily. She has some memory deficit.   Vascular dementia - unchanged on 78m qhs of donepezil; weight down 10 lbs since Dec 2018.   Hypothyroidism - uncontrolled 2/2 noncompliance with levothyroxine; TSH 11.17; T4 free 1.0  Past Medical History:  Diagnosis Date  . Carpal tunnel syndrome   . [place] mellitus [place] complication [place]   . Dysphagia   . GERD (gastroesophageal reflux disease)   . Hiatal hernia   . HLD (hyperlipidemia)   . Hx of adenomatous colonic polyps 06/19/2014  . Hypertension   . Hypothyroidism   . Onychomycosis 03/05/2012  . Pain in lower limb 04/18/2013  . Sciatic nerve pain   . Vertigo     Past Surgical History:  Procedure Laterality Date  . BLADDER SURGERY     Mesh implant  . COLONOSCOPY    . LARYNX SURGERY     vocal cords  . PARTIAL HYSTERECTOMY  1978  . VESICOVAGINAL FISTULA CLOSURE W/ TAH  1976     reports that she has never smoked. She has never used smokeless tobacco. She reports that she does not drink alcohol or use drugs. Social History   Socioeconomic History  . Marital status: Single  Spouse name: Not on file  . Number of children: 2  . Years of education: Not on file  . Highest education level: Not on file  Occupational History  . Occupation: Retired  Scientific laboratory technician  . Financial resource strain: Not hard at all  . Food insecurity:    Worry: Never true    Inability: Never true  . Transportation needs:    Medical: No    Non-medical: No  Tobacco Use  . Smoking status: Never Smoker  . Smokeless tobacco: Never Used  Substance and Sexual Activity  . Alcohol use: No    Alcohol/week: 0.0 oz  . Drug use: No  .  Sexual activity: Never    Birth control/protection: Abstinence  Lifestyle  . Physical activity:    Days per week: 2 days    Minutes per session: 20 min  . Stress: Rather much  Relationships  . Social connections:    Talks on phone: More than three times a week    Gets together: More than three times a week    Attends religious service: More than 4 times per year    Active member of club or organization: No    Attends meetings of clubs or organizations: Never    Relationship status: Never married  . Intimate partner violence:    Fear of current or ex partner: No    Emotionally abused: No    Physically abused: No    Forced sexual activity: No  Other Topics Concern  . Not on file  Social History Narrative   Single   2 sons   Retired   2 cups caffeine daily   05/01/2014    Family History  Problem Relation Age of Onset  . Alzheimer's disease Mother   . Glaucoma Mother   . Heart disease Father   . Alzheimer's disease Father   . Asthma Sister   . Allergies Sister   . Allergies Sister   . Sleep apnea Sister   . Heart disease Sister   . Breast cancer Maternal Aunt   . Pancreatic cancer Maternal Aunt   . Prostate cancer Maternal Uncle   . Prostate cancer Paternal Uncle   . Alcoholism Maternal Uncle   . Kidney disease Maternal Aunt   . Heart disease Maternal Aunt     Allergies  Allergen Reactions  . Lantus [Insulin Glargine] Itching and Swelling    angioedema  . Levemir [Insulin Detemir] Itching and Swelling    angioedema  . Pineapple Anaphylaxis  . Codeine Other (See Comments)    unknown  . Invokana [Canagliflozin] Other (See Comments)    Constipation, nightmares  . Penicillins Other (See Comments)    Unknown   . Tradjenta [Linagliptin] Other (See Comments)  . Vioxx [Rofecoxib] Other (See Comments)    unknown    Outpatient Encounter Medications as of 06/29/2017  Medication Sig  . amLODipine (NORVASC) 10 MG tablet Take 1 tablet by mouth daily for blood  pressure in the morning  . atorvastatin (LIPITOR) 40 MG tablet Take 1 tablet (40 mg total) by mouth at bedtime. For Cholesterol  . Blood Glucose Monitoring Suppl (ONETOUCH VERIO) w/Device KIT 1 each by Does not apply route 3 (three) times daily. Use to test blood sugar three times daily. Dx: E11.21  . chlorthalidone (HYGROTON) 25 MG tablet Take 0.5 tablets (12.5 mg total) by mouth daily in the morning for High Blood Pressure  . citalopram (CELEXA) 10 MG tablet Take 1 tablet (10 mg total) by mouth  daily For anxiety in the morning  . clopidogrel (PLAVIX) 75 MG tablet Take 1 tablet (75 mg total) by mouth daily in the morning for history of stroke  . donepezil (ARICEPT) 5 MG tablet Take 1 tablet (5 mg total) by mouth at bedtime.  . gabapentin (NEURONTIN) 100 MG capsule Take 2 capsules (200 mg total) by mouth 3 (three) times daily as needed (nerve pain).  Marland Kitchen glucose blood (ONETOUCH VERIO) test strip Check blood sugar three times daily DX E11.21 (One Touch Verio IQ)  . insulin aspart protamine - aspart (NOVOLOG MIX 70/30 FLEXPEN) (70-30) 100 UNIT/ML FlexPen Inject 24 units into skin twice daily before meals . Dx E11.9  . Insulin Pen Needle (PEN NEEDLES) 31G X 5 MM MISC 1 each by Does not apply route as needed. Use when giving insulin injections. Dx: E11.9  . levothyroxine (SYNTHROID, LEVOTHROID) 150 MCG tablet take 1 tablet by mouth every morning ON AN EMPTY STOMACH for THROID in the morning  . losartan (COZAAR) 100 MG tablet Take 1 tablet (100 mg total) by mouth daily in the morning for High Blood Pressure  . meclizine (ANTIVERT) 25 MG tablet Take 1 tablet (25 mg total) by mouth 3 (three) times daily as needed for dizziness.  . metFORMIN (GLUCOPHAGE) 500 MG tablet take 2 tablets by mouth twice a day with food  . ONETOUCH DELICA LANCETS 45G MISC Use to test blood sugar three times daily. Dx: E11.21  . pantoprazole (PROTONIX) 40 MG tablet Take 1 tablet (40 mg total) by mouth daily in the morning  . vitamin  E (VITAMIN E) 1000 UNIT capsule Take 1,000 Units by mouth daily.   No facility-administered encounter medications on file as of 06/29/2017.     Review of Systems:  Review of Systems  Unable to perform ROS: Dementia    Health Maintenance  Topic Date Due  . OPHTHALMOLOGY EXAM  12/30/1944  . TETANUS/TDAP  06/12/2013  . INFLUENZA VACCINE  08/12/2017  . HEMOGLOBIN A1C  11/26/2017  . FOOT EXAM  02/12/2018  . DEXA SCAN  Completed  . PNA vac Low Risk Adult  Completed    Physical Exam: Vitals:   06/29/17 0925  BP: (!) 144/80  Pulse: 79  Temp: 98 F (36.7 C)  SpO2: 96%  Weight: 142 lb 8 oz (64.6 kg)  Height: '5\' 4"'  (1.626 m)   Body mass index is 24.46 kg/m. Physical Exam  Constitutional: She appears well-developed.  Frail appearing in NAD  Cardiovascular: Normal rate, regular rhythm and intact distal pulses. Exam reveals no gallop and no friction rub.  Murmur (1/6 SEM) heard. No LE edema b/l; no calf TTP  Pulmonary/Chest: Effort normal and breath sounds normal. No stridor. No respiratory distress. She has no wheezes. She has no rales.  Neurological: She is alert.  Skin: Skin is warm and dry. No rash noted.  Psychiatric: She has a normal mood and affect. Her behavior is normal. Thought content normal.    Labs reviewed: Basic Metabolic Panel: Recent Labs    12/10/16 1008 12/22/16 2206 02/12/17 1059 05/26/17 1233  NA 138 140 141 140  K 4.0 3.0* 3.5 3.8  CL 101 99* 106 101  CO2 '29 31 30 31  ' GLUCOSE 311* 214* 216* 324*  BUN '11 6 7 9  ' CREATININE 0.87 0.80 0.74 0.85  CALCIUM 9.6 9.6 9.2 9.7  TSH 15.51*  --  12.94* 11.17*   Liver Function Tests: Recent Labs    12/10/16 1008 12/22/16 2206 05/26/17 1233  AST '11 19 13  ' ALT '10 14 11  ' ALKPHOS  --  72  --   BILITOT 0.5 0.9 0.8  PROT 6.9 7.3 6.8  ALBUMIN  --  4.1  --    Recent Labs    12/22/16 2206  LIPASE 20   No results for input(s): AMMONIA in the last 8760 hours. CBC: Recent Labs    12/22/16 2206  02/12/17 1059 05/26/17 1233  WBC 13.4* 7.0 7.1  NEUTROABS 10.7* 3,864  --   HGB 14.0 13.1 13.4  HCT 40.8 38.8 39.8  MCV 80.2 80.3 80.4  PLT 253 235 249   Lipid Panel: Recent Labs    12/10/16 1008 05/26/17 1233  CHOL 307* 276*  HDL 50* 56  LDLCALC 225* 197*  TRIG 155* 103  CHOLHDL 6.1* 4.9   Lab Results  Component Value Date   HGBA1C 13.1 (H) 05/26/2017    Procedures since last visit: Ct Head Wo Contrast  Result Date: 06/11/2017 CLINICAL DATA:  History of previous stroke with ataxia EXAM: CT HEAD WITHOUT CONTRAST TECHNIQUE: Contiguous axial images were obtained from the base of the skull through the vertex without intravenous contrast. COMPARISON:  11/11/2016 MRI, CT of the head from 04/02/2016 FINDINGS: Brain: Mild chronic white matter ischemic changes noted. No findings to suggest acute hemorrhage, acute infarction or space-occupying mass lesion are noted. Vascular: No hyperdense vessel or unexpected calcification. Skull: Normal. Negative for fracture or focal lesion. Sinuses/Orbits: No acute finding. Other: None. IMPRESSION: Mild chronic white matter ischemic change similar to that noted on prior exams. No acute abnormality is noted. Electronically Signed   By: Inez Catalina M.D.   On: 06/11/2017 15:13    Assessment/Plan   ICD-10-CM   1. Vascular dementia without behavioral disturbance F01.50 donepezil (ARICEPT) 10 MG tablet  2. Essential hypertension I10   3. Uncontrolled type 2 diabetes mellitus with hyperglycemia, with long-term current use of insulin (HCC) E11.65    Z79.4   4. History of stroke Z86.73    INCREASE DONEPEZIL TO 10MG AT BEDTIME  D/c Vitamin E supp  Continue other medications as ordered  TAKE ALL MEDICATIONS AS ORDERED EVERY DAY!  Follow up with specialists as scheduled  Check blood sugars at least 2 times daily and keep journal  NO DRIVING!!  Follow up in 1 month for dementia, DM and HTN. Will check TSH next ov   Rhaya Coale S. Perlie Gold  [facility] [address] [place], [place] [address] [phone] Cell (Monday-Friday 8 AM - 5 PM) [phone] After 5 PM and follow prompts

## 2017-06-29 NOTE — Patient Outreach (Signed)
[facility]) Care Management  06/29/2017  Allison [name] [birth year] [id number]   !st outreach attempt to the patient for initial assessment. No answer.  Unable to leave a message several rings but no answering machine to pick up.  Plan: RN Health Coach will send letter. RN Health Coach will make another attempt to the patient within four business days.  Lazaro Arms RN, BSN, [place] Direct Dial:  [phone]  Fax: [phone]

## 2017-07-02 ENCOUNTER — Ambulatory Visit: Payer: Self-pay | Admitting: Pharmacist

## 2017-07-02 ENCOUNTER — Telehealth: Payer: Self-pay | Admitting: Pharmacist

## 2017-07-02 NOTE — Patient Outreach (Signed)
[name]) Care Management  07/02/2017  Allison [name] 01-Jul-1934 [id number]  Successful call to Allison [name] to follow-up on medications.  Patient states she has not received her delivery of compliance packs yet from [facility].  Patient states she is doing better with her insulin and that her CBGs have been 100, 120, and 122.  She estimates her highest number she has seen recently  is in the 130s.  Patient's son, Allison [name], came onto the phone and states that her blood sugars are really in the 300s.  She refuses nutritional advice and drinks sodas often.  He states he and his brother have been discussing if patient would benefit from SNF.  I advised him to contact patient's providers to review steps for this and if patient still deemed competent.  He states that even with medications in compliance packs, patient is not taking them correctly due to dementia.  3-way call placed to [facility].  Son will pick up medications in compliance pack tomorrow as he is worried about them being delivered to patient without him there.  Pharmacy has scripts for glucometer and supplies but is not able to bill Medicare Part B.  Son states glucometer at the home is now working and patient doesn't need a new one right now.    Plan: I will follow-up with patient regarding medication adherence in 2-3 weeks.   Ralene Bathe, PharmD, [place] [phone]

## 2017-07-05 ENCOUNTER — Other Ambulatory Visit: Payer: Self-pay

## 2017-07-05 NOTE — Patient Outreach (Signed)
[facility]) Care Management  07/05/2017  Allison [name] 11-Jan-1935 [id number]    2nd telephone outreach to the patient for initial assessment. Spoke with the patient and she stated that she was unable to talk this morning and asked if I could return the call.  Plan: RN Health Coach will make another outreach attempt to the patient within four business days.  Lazaro Arms RN, BSN, [place] Direct Dial:  [phone]  Fax: [phone]   '

## 2017-07-06 ENCOUNTER — Other Ambulatory Visit: Payer: Self-pay | Admitting: *Deleted

## 2017-07-06 ENCOUNTER — Other Ambulatory Visit: Payer: Self-pay

## 2017-07-06 NOTE — Patient Outreach (Signed)
[place] [facility]) Care Management  07/06/2017  MALALA TRENKAMP 01/15/1934 [id number]   CSW was able to make initial outreach contact with pt. Pt's identity confirmed and CSW introduced self and role. Pt was alert, oriented and engaged during call. She reports she is interested in going to an adult day center; stating, "I want to be more active like I was". She states she does not have a HCPOA but has the forms to complete.  CSW offered to call her family as well and she was agreeable to CSW calling her son(s).  CSW attempted to call both sons and was unsuccessful. CSW will mail pt info on adult day centers, transportation resources and plan a f/u call to her in 7-10 days. CSW will also outreach family again.   Eduard Clos, MSW, [place] Worker  [place] [phone]

## 2017-07-06 NOTE — Patient Outreach (Signed)
[place] [facility]) Care Management  07/06/2017  Allison [name] 1934/03/14 [id number]   3rd outreach attempt to the patient for initial assessment. No answer.  Unable to leave a message no answering machine pickup.   Plan: If no response to calls and letter in ten business days. Rn health Coach will proceed with case closure.    Lazaro Arms RN, BSN, [place] Direct Dial:  [phone]  Fax: [phone]

## 2017-07-07 DIAGNOSIS — I69354 Hemiplegia and hemiparesis following cerebral infarction affecting left non-dominant side: Secondary | ICD-10-CM | POA: Diagnosis not present

## 2017-07-07 DIAGNOSIS — E1165 Type 2 diabetes mellitus with hyperglycemia: Secondary | ICD-10-CM | POA: Diagnosis not present

## 2017-07-07 DIAGNOSIS — F0150 Vascular dementia without behavioral disturbance: Secondary | ICD-10-CM | POA: Diagnosis not present

## 2017-07-07 DIAGNOSIS — I69315 Cognitive social or emotional deficit following cerebral infarction: Secondary | ICD-10-CM | POA: Diagnosis not present

## 2017-07-07 DIAGNOSIS — I10 Essential (primary) hypertension: Secondary | ICD-10-CM | POA: Diagnosis not present

## 2017-07-07 DIAGNOSIS — Z794 Long term (current) use of insulin: Secondary | ICD-10-CM | POA: Diagnosis not present

## 2017-07-09 ENCOUNTER — Telehealth: Payer: Self-pay | Admitting: *Deleted

## 2017-07-09 NOTE — Patient Outreach (Signed)
[place] [facility]) Care Management  07/09/2017  Allison [name] 19-Apr-1934 [id number]   BSW mailed patient community resources as requested by CSW [employer].  Daneen Schick, BSW, CDP [facility] [phone]

## 2017-07-09 NOTE — Telephone Encounter (Signed)
Olivia Mackie from [facility] called and said that patient missed nurse visit this week and she hasn't been able to reach her on the phone. Called patient and she said that she has had a lot going on with her family that has gotten in the way of the visit. She has also had issues with her phone accepting calls. Advised patient to contact Olivia Mackie and let her know what was going on so that they could set up a new time. Message left advising Olivia Mackie of the same.

## 2017-07-13 ENCOUNTER — Other Ambulatory Visit: Payer: Self-pay

## 2017-07-13 NOTE — Patient Outreach (Signed)
[place] [facility]) Care Management  07/13/2017  Allison [name] [birth year] [id number]    Patient has not responded to calls or letter.   Plan:  RN Health Coach will proceed with case closure.   Lazaro Arms RN, BSN, [place] Direct Dial:  [phone]  Fax: [phone]

## 2017-07-14 ENCOUNTER — Other Ambulatory Visit: Payer: Self-pay | Admitting: *Deleted

## 2017-07-14 NOTE — Patient Outreach (Signed)
[place] [facility]) Care Management  07/14/2017  Allison [name] August 22, 1934 [id number]   CSW was able to make contact with pt's son, Allison [name], today and confirmed pt's identity. CSW introduced s elf and role and reason for call. Per son, the main concern is that pt "has too much free time" and needs more activity. CSW advised him of info sent to her for SCAT and Adult Day [place] programs to consider. He plans to encourage and assist her with looking into these options. He feels the main concern is that "she doesn't take her medications because she doesn't want to" (not because she forgets). CSW advised him that [facility] RPH has been working with her and I will ask that they follow up on this with them.   Son plans to call CSW back if he cannot locate the information sent on SCAT and Adult Day care.  CSW will plan a follow up call in the next 2 weeks.   Eduard Clos, MSW, [place] Worker  [place] [phone]

## 2017-07-18 ENCOUNTER — Other Ambulatory Visit: Payer: Self-pay

## 2017-07-18 ENCOUNTER — Emergency Department (HOSPITAL_COMMUNITY)
Admission: EM | Admit: 2017-07-18 | Discharge: 2017-07-18 | Disposition: A | Discharge status: Home / Self Care | Payer: Medicare Other | Attending: Emergency Medicine | Admitting: Emergency Medicine

## 2017-07-18 ENCOUNTER — Emergency Department (HOSPITAL_COMMUNITY): Payer: Medicare Other

## 2017-07-18 ENCOUNTER — Encounter (HOSPITAL_COMMUNITY): Payer: Self-pay | Admitting: Emergency Medicine

## 2017-07-18 DIAGNOSIS — Z8673 Personal history of transient ischemic attack (TIA), and cerebral infarction without residual deficits: Secondary | ICD-10-CM | POA: Diagnosis not present

## 2017-07-18 DIAGNOSIS — E039 Hypothyroidism, unspecified: Secondary | ICD-10-CM | POA: Insufficient documentation

## 2017-07-18 DIAGNOSIS — Z794 Long term (current) use of insulin: Secondary | ICD-10-CM | POA: Insufficient documentation

## 2017-07-18 DIAGNOSIS — I10 Essential (primary) hypertension: Secondary | ICD-10-CM | POA: Diagnosis not present

## 2017-07-18 DIAGNOSIS — E1140 Type 2 diabetes mellitus with diabetic neuropathy, unspecified: Secondary | ICD-10-CM | POA: Diagnosis not present

## 2017-07-18 DIAGNOSIS — Z7901 Long term (current) use of anticoagulants: Secondary | ICD-10-CM | POA: Diagnosis not present

## 2017-07-18 DIAGNOSIS — R1084 Generalized abdominal pain: Secondary | ICD-10-CM | POA: Insufficient documentation

## 2017-07-18 DIAGNOSIS — Z79899 Other long term (current) drug therapy: Secondary | ICD-10-CM | POA: Diagnosis not present

## 2017-07-18 DIAGNOSIS — E119 Type 2 diabetes mellitus without complications: Secondary | ICD-10-CM | POA: Diagnosis not present

## 2017-07-18 DIAGNOSIS — K5790 Diverticulosis of intestine, part unspecified, without perforation or abscess without bleeding: Secondary | ICD-10-CM | POA: Diagnosis not present

## 2017-07-18 LAB — ABO/RH: ABO/RH(D): O POS

## 2017-07-18 LAB — TYPE AND SCREEN
ABO/RH(D): O POS
ANTIBODY SCREEN: NEGATIVE

## 2017-07-18 LAB — COMPREHENSIVE METABOLIC PANEL WITH GFR
ALT: 14 U/L (ref 0–44)
AST: 15 U/L (ref 15–41)
Albumin: 3.9 g/dL (ref 3.5–5.0)
Alkaline Phosphatase: 58 U/L (ref 38–126)
Anion gap: 10 (ref 5–15)
BUN: 11 mg/dL (ref 8–23)
CO2: 24 mmol/L (ref 22–32)
Calcium: 9.4 mg/dL (ref 8.9–10.3)
Chloride: 104 mmol/L (ref 98–111)
Creatinine, Ser: 0.99 mg/dL (ref 0.44–1.00)
GFR calc Af Amer: >60 mL/min
GFR calc non Af Amer: 52 mL/min — ABNORMAL LOW
Glucose, Bld: 338 mg/dL — ABNORMAL HIGH (ref 70–99)
Potassium: 4.5 mmol/L (ref 3.5–5.1)
Sodium: 138 mmol/L (ref 135–145)
Total Bilirubin: 1 mg/dL (ref 0.3–1.2)
Total Protein: 6.9 g/dL (ref 6.5–8.1)

## 2017-07-18 LAB — PROTIME-INR
INR: 1.08
Prothrombin Time: 13.9 seconds (ref 11.4–15.2)

## 2017-07-18 LAB — CBC
HEMATOCRIT: 41.5 % (ref 36.0–46.0)
Hemoglobin: 13.6 g/dL (ref 12.0–15.0)
MCH: 27 pg (ref 26.0–34.0)
MCHC: 32.8 g/dL (ref 30.0–36.0)
MCV: 82.3 fL (ref 78.0–100.0)
Platelets: 239 10*3/uL (ref 150–400)
RBC: 5.04 MIL/uL (ref 3.87–5.11)
RDW: 13.4 % (ref 11.5–15.5)
WBC: 8.9 10*3/uL (ref 4.0–10.5)

## 2017-07-18 MED ORDER — IOHEXOL 300 MG/ML  SOLN
100.0000 mL | Freq: Once | INTRAMUSCULAR | Status: AC | PRN
  Administered 2017-07-18: 100 mL via INTRAVENOUS

## 2017-07-18 MED ORDER — ONDANSETRON 4 MG PO TBDP: 4.0000 mg | ORAL_TABLET | Freq: Three times a day (TID) | ORAL | 0 refills | Status: DC | PRN

## 2017-07-18 MED ORDER — SODIUM CHLORIDE 0.9 % IV SOLN
INTRAVENOUS | Status: DC
  Administered 2017-07-18: 14:00:00 via INTRAVENOUS

## 2017-07-18 NOTE — ED Notes (Signed)
Patient verbalizes understanding of discharge instructions. Opportunity for questioning and answers were provided. Armband removed by staff, pt discharged from ED.  

## 2017-07-18 NOTE — Discharge Instructions (Signed)
Stress, today's evaluation has been generally reassuring, there is no evidence for acute processes such as diverticulitis on your CT scan of the abdomen and pelvis. There are some nonspecific findings found in your upper chest, but without current symptoms, it is important that you simply monitor your condition, and discuss these with your physician to arrange appropriate follow-up.  Please return here for concerning changes in your condition.

## 2017-07-18 NOTE — ED Provider Notes (Signed)
[facility] EMERGENCY DEPARTMENT Provider Note   CSN: [id number] Arrival date & time: 07/18/17  1255     History   Chief Complaint Chief Complaint  Patient presents with  . Abdominal Pain    HPI Allison [name] is a 83 y.o. female.  HPI Notes with nausea, loose, red stool, abdominal pain, weakness. She is here with a female companion who assists with the HPI. She was well until about 3 hours ago, when after eating, she felt onset of pain, pain is right mid central abdomen, sore, seemingly nonradiating. She has had loose stool, with frequency, and bright red material in her feces. No vomiting. There is nausea, anorexia. No medication taken for pain relief, as she is she was brought here due to the new pain, symptoms. Patient is in generally well, acknowledges history of multiple medical issues, however, including diabetes. It is unclear if she has had abdominal surgery previously.  Past Medical History:  Diagnosis Date  . Carpal tunnel syndrome   . Diabetes mellitus without complication ([place])   . Dysphagia   . GERD (gastroesophageal reflux disease)   . Hiatal hernia   . HLD (hyperlipidemia)   . Hx of adenomatous colonic polyps 06/19/2014  . Hypertension   . Hypothyroidism   . Onychomycosis 03/05/2012  . Pain in lower limb 04/18/2013  . Sciatic nerve pain   . Vertigo     Patient Active Problem List   Diagnosis Date Noted  . History of stroke 10/01/2016  . Anxiety state 06/03/2016  . ARI (acute respiratory infection) 01/22/2016  . Uncontrolled type 2 diabetes mellitus with hyperglycemia, with long-term current use of insulin ([place]) 10/29/2015  . Acute ischemic stroke ([place]) 10/29/2015  . Hallucinations   . Visual hallucinations 10/28/2015  . Stroke-like symptoms 10/28/2015  . CVA (cerebral vascular accident) ([place]) 10/28/2015  . Ischemic stroke ([place])   . Diabetes mellitus with complication ([place])   . Hx of adenomatous colonic polyps 06/19/2014  .  Neuropathic pain 03/27/2014  . Uncontrolled type 2 diabetes with neuropathy ([place]) 03/27/2014  . Bilateral carpal tunnel syndrome 03/27/2014  . AKI (acute kidney injury) ([place]) 08/05/2013  . DM type 2, uncontrolled, with renal complications ([place]) 84/13/2440  . Hypercalcemia 08/05/2013  . PVD (peripheral vascular disease) ([place]) 03/05/2012  . Onychomycosis 03/05/2012  . Other hammer toe (acquired) 03/05/2012  . Abnormal CXR 12/25/2011  . Hypothyroidism 06/23/2006  . Dyslipidemia 06/23/2006  . Essential hypertension 06/23/2006  . GERD 06/23/2006  . IBS 06/23/2006    Past Surgical History:  Procedure Laterality Date  . BLADDER SURGERY     Mesh implant  . COLONOSCOPY    . LARYNX SURGERY     vocal cords  . PARTIAL HYSTERECTOMY  1978  . VESICOVAGINAL FISTULA CLOSURE W/ TAH  1976     OB History   None      Home Medications    Prior to Admission medications   Medication Sig Start Date End Date Taking? Authorizing Provider  amLODipine (NORVASC) 10 MG tablet Take 1 tablet by mouth daily for blood pressure in the morning 06/02/17  Yes Eulas Post, Monica, DO  atorvastatin (LIPITOR) 40 MG tablet Take 1 tablet (40 mg total) by mouth at bedtime. For Cholesterol 06/02/17  Yes Gildardo Cranker, DO  chlorthalidone (HYGROTON) 25 MG tablet Take 0.5 tablets (12.5 mg total) by mouth daily in the morning for High Blood Pressure 06/02/17  Yes Gildardo Cranker, DO  citalopram (CELEXA) 10 MG tablet Take 1 tablet (10 mg  total) by mouth daily For anxiety in the morning 06/02/17  Yes Gildardo Cranker, DO  clopidogrel (PLAVIX) 75 MG tablet Take 1 tablet (75 mg total) by mouth daily in the morning for history of stroke 06/15/17  Yes Eulas Post, Monica, DO  donepezil (ARICEPT) 10 MG tablet Take 1 tablet (10 mg total) by mouth at bedtime. For memory 06/29/17  Yes Eulas Post, Monica, DO  gabapentin (NEURONTIN) 100 MG capsule Take 2 capsules (200 mg total) by mouth 3 (three) times daily as needed (nerve pain). 06/15/17  Yes Eulas Post,  Monica, DO  insulin aspart protamine - aspart (NOVOLOG MIX 70/30 FLEXPEN) (70-30) 100 UNIT/ML FlexPen Inject 24 units into skin twice daily before meals . Dx E11.9 06/15/17  Yes Gildardo Cranker, DO  levothyroxine (SYNTHROID, LEVOTHROID) 150 MCG tablet take 1 tablet by mouth every morning ON AN EMPTY STOMACH for THROID in the morning 06/02/17  Yes Gildardo Cranker, DO  losartan (COZAAR) 100 MG tablet Take 1 tablet (100 mg total) by mouth daily in the morning for High Blood Pressure 06/02/17  Yes Gildardo Cranker, DO  meclizine (ANTIVERT) 25 MG tablet Take 1 tablet (25 mg total) by mouth 3 (three) times daily as needed for dizziness. 10/13/16  Yes Gildardo Cranker, DO  metFORMIN (GLUCOPHAGE) 500 MG tablet take 2 tablets by mouth twice a day with food 06/15/17  Yes Eulas Post, Monica, DO  pantoprazole (PROTONIX) 40 MG tablet Take 1 tablet (40 mg total) by mouth daily in the morning 06/02/17  Yes Gildardo Cranker, DO  vitamin E (VITAMIN E) 1000 UNIT capsule Take 1,000 Units by mouth daily.   Yes [provider]  Blood Glucose Monitoring Suppl (ONETOUCH VERIO) w/Device KIT 1 each by Does not apply route 3 (three) times daily. Use to test blood sugar three times daily. Dx: E11.21 06/15/17   Gildardo Cranker, DO  glucose blood [facility] VERIO) test strip Check blood sugar three times daily DX E11.21 (One Touch Verio IQ) 06/15/17   Gildardo Cranker, DO  Insulin Pen Needle (PEN NEEDLES) 31G X 5 MM MISC 1 each by Does not apply route as needed. Use when giving insulin injections. Dx: E11.9 06/15/17   Gildardo Cranker, DO  ondansetron (ZOFRAN ODT) 4 MG disintegrating tablet Take 1 tablet (4 mg total) by mouth every 8 (eight) hours as needed for nausea or vomiting. 07/18/17   Carmin Muskrat, MD  [facility] DELICA LANCETS 81X MISC Use to test blood sugar three times daily. Dx: E11.21 06/15/17   Gildardo Cranker, DO    Family History Family History  Problem Relation Age of Onset  . Alzheimer's disease Mother   . Glaucoma Mother   . Heart  disease Father   . Alzheimer's disease Father   . Asthma Sister   . Allergies Sister   . Allergies Sister   . Sleep apnea Sister   . Heart disease Sister   . Breast cancer Maternal Aunt   . Pancreatic cancer Maternal Aunt   . Prostate cancer Maternal Uncle   . Prostate cancer Paternal Uncle   . Alcoholism Maternal Uncle   . Kidney disease Maternal Aunt   . Heart disease Maternal Aunt     Social History Social History   Tobacco Use  . Smoking status: Never Smoker  . Smokeless tobacco: Never Used  Substance Use Topics  . Alcohol use: No    Alcohol/week: 0.0 oz  . Drug use: No     Allergies   Lantus [insulin glargine]; Levemir [insulin detemir]; Pineapple; Codeine; Invokana [canagliflozin]; Penicillins;  Tradjenta [linagliptin]; and Vioxx [rofecoxib]   Review of Systems Review of Systems  Constitutional:       Per HPI, otherwise negative  HENT:       Per HPI, otherwise negative  Respiratory:       Per HPI, otherwise negative  Cardiovascular:       Per HPI, otherwise negative  Gastrointestinal: Positive for abdominal pain, blood in stool, diarrhea and nausea. Negative for vomiting.  Endocrine:       Negative aside from HPI  Genitourinary:       Neg aside from HPI   Musculoskeletal:       Per HPI, otherwise negative  Skin: Negative.   Neurological: Negative for syncope.     Physical Exam Updated Vital Signs BP (!) 161/73   Pulse 77   Temp 98.6 F (37 C) (Oral)   Resp 14   Ht '5\' 4"'  (1.626 m)   Wt 64.4 kg (142 lb)   SpO2 98%   BMI 24.37 kg/m   Physical Exam  Constitutional: She is oriented to person, place, and time. She appears well-developed and well-nourished. No distress.  HENT:  Head: Normocephalic and atraumatic.  Eyes: Conjunctivae and EOM are normal.  Cardiovascular: Normal rate and regular rhythm.  Pulmonary/Chest: Effort normal and breath sounds normal. No stridor. No respiratory distress.  Abdominal: She exhibits no distension.     Musculoskeletal: She exhibits no edema.  Neurological: She is alert and oriented to person, place, and time. No cranial nerve deficit.  Skin: Skin is warm and dry.  Psychiatric: She has a normal mood and affect.  Nursing note and vitals reviewed.    ED Treatments / Results  Labs (all labs ordered are listed, but only abnormal results are displayed) Labs Reviewed  COMPREHENSIVE METABOLIC PANEL - Abnormal; Notable for the following components:      Result Value   Glucose, Bld 338 (*)    GFR calc non Af Amer 52 (*)    All other components within normal limits  CBC  PROTIME-INR  POC OCCULT BLOOD, ED  TYPE AND SCREEN  ABO/RH    EKG None  Radiology Ct Abdomen Pelvis W Contrast  Result Date: 07/18/2017 CLINICAL DATA:  Left side abdominal pain and diarrhea beginning this morning. EXAM: CT ABDOMEN AND PELVIS WITH CONTRAST TECHNIQUE: Multidetector CT imaging of the abdomen and pelvis was performed using the standard protocol following bolus administration of intravenous contrast. CONTRAST:  100 ml OMNIPAQUE IOHEXOL 300 MG/ML  SOLN COMPARISON:  None. FINDINGS: Lower chest: Scattered subcentimeter nodules are seen in the lower lobes bilaterally including a subpleural nodule in the left lower lobe measuring 0.7 cm on image 5, a 0.4 cm right middle lobe subpleural nodule on image 1 and a 0.6 cm right middle lobe nodule also on image 1. Lung bases are otherwise clear. Heart size is normal. No pleural or pericardial effusion. Hepatobiliary: No focal liver abnormality is seen. No gallstones, gallbladder wall thickening, or biliary dilatation. Pancreas: Unremarkable. No pancreatic ductal dilatation or surrounding inflammatory changes. Spleen: Normal in size without focal abnormality. Adrenals/Urinary Tract: 2 small right and a single small left renal cysts are identified. 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 Urinary bladder is  unremarkable. Stomach/Bowel: There is some thickening of the gastric antrum. A few sigmoid diverticula are seen but there is no diverticulitis. The colon is otherwise normal in appearance. No evidence of appendicitis. The small bowel is normal appearance. Vascular/Lymphatic: Aortic atherosclerosis.  No enlarged abdominal or pelvic lymph nodes. Reproductive: Status post hysterectomy. No adnexal masses. Other: No fluid collection. Musculoskeletal: No acute or focal bony abnormality. Mild appearing lumbar spondylosis is most notable at L4-5. IMPRESSION: Thickening of the gastric antrum is likely due to underdistention but could also reflect inflammatory process or much less likely infiltrating neoplasm. Subcentimeter pulmonary nodules in the lung bases as described above. Follow-up non-contrast CT recommended at 3-6 months to confirm persistence. If unchanged, and solid component remains <6 mm, annual CT is recommended until 5 years of stability has been established. If persistent these nodules should be considered highly suspicious if the solid component of the nodule is 6 mm or greater in size and enlarging. This recommendation follows the consensus statement: Guidelines for Management of Incidental Pulmonary Nodules Detected on CT Images: From the Fleischner Society 2017; Radiology 2017; 284:228-243. Atherosclerosis. Mild diverticulosis without diverticulitis. Electronically Signed   By: Inge Rise M.D.   On: 07/18/2017 15:35    Procedures Procedures (including critical care time)  Medications Ordered in ED Medications  0.9 %  sodium chloride infusion ( Intravenous New Bag/Given 07/18/17 1350)  iohexol (OMNIPAQUE) 300 MG/ML solution 100 mL (100 mLs Intravenous Contrast Given 07/18/17 1501)     Initial Impression / Assessment and Plan / ED Course  I have reviewed the triage vital signs and the nursing notes.  Pertinent labs & imaging results that were available during my care of the patient were  reviewed by me and considered in my medical decision making (see chart for details).     5:02 PM Patient awake, alert, calm, has had no bowel movements while here, no additional bleeding, remains hemodynamically unremarkable. I discussed all findings with the patient and her son. Specifically discussed the generally reassuring CT scan, without evidence for diverticulitis or other acute abdominal processes. She is hemodynamically unremarkable, with no hemoglobin change from baseline, and without ongoing bleeding, low suspicion for substantial GI exsanguination. Given her absence of ongoing complaints, the patient was discharged in stable condition to follow-up with GI and primary care.   Final Clinical Impressions(s) / ED Diagnoses   Final diagnoses:  Generalized abdominal pain    ED Discharge Orders        Ordered    ondansetron (ZOFRAN ODT) 4 MG disintegrating tablet  Every 8 hours PRN     07/18/17 1702       Carmin Muskrat, MD 07/18/17 1703

## 2017-07-18 NOTE — ED Triage Notes (Signed)
Patient to ED c/o sudden onset abdominal cramping while at restaurant this afternoon - states she didn't eat and she's had 2 episodes of liquid, red-pinkish diarrhea since. Nausea, but no vomiting.

## 2017-07-26 ENCOUNTER — Other Ambulatory Visit: Payer: Self-pay | Admitting: *Deleted

## 2017-07-26 NOTE — Patient Outreach (Signed)
[name]) Care Management  07/26/2017  Allison [name] Jul 09, 1934 [id number]  CSW spoke briefly with pt's son who asked if he could call back later. CSW has not yet heard back from him. CSW was also unable to reach pt by phone.  CSW will plan a follow up call next week if no return call is received. CSW will send an unsuccessful outreach letter as well.   Eduard Clos, MSW, [place] Worker  [place] [phone]

## 2017-07-27 ENCOUNTER — Encounter: Payer: Self-pay | Admitting: *Deleted

## 2017-07-28 ENCOUNTER — Ambulatory Visit: Payer: Self-pay | Admitting: Pharmacist

## 2017-07-28 ENCOUNTER — Other Ambulatory Visit: Payer: Self-pay | Admitting: Pharmacist

## 2017-07-28 NOTE — Patient Outreach (Signed)
[place] [facility]) Care Management  07/28/2017  ELASIA FURNISH 11/27/1934 [id number]  Successful call placed to Ms. Melodee Gebhard's son, Nicole Kindred.  HIPAA identifiers verified. Son is with patient during phone call.  Son reports that patient has started the new compliance packs from [facility] and is doing well with using the packs.  He is able to review if patient has missed doses and reports she is doing much better with adherence.  He also reports that he can hear her insulin pen "clicking away" as she administers the insulin dose and reports her technique has improved.  Patient reports she is feeling better and walking better when she is adherence with medications.  Patient reports that one medication was recently reduced and I encouraged patient and son to contact [facility] if they need to have compliance packs re-packed with updated dose.  Patient and son voiced understanding.  They have no further medication questions or concerns at this time.    Noted per review of [facility] that patient did not respond to [place] for diabetes management.  [facility] SW remains involved.   Plan: I will close [place] case at this time but am happy to assist in the future as needed.    Ralene Bathe, PharmD, [place] [phone]

## 2017-07-30 ENCOUNTER — Encounter: Payer: Self-pay | Admitting: Internal Medicine

## 2017-07-30 ENCOUNTER — Ambulatory Visit (INDEPENDENT_AMBULATORY_CARE_PROVIDER_SITE_OTHER): Payer: Medicare Other | Admitting: Internal Medicine

## 2017-07-30 VITALS — BP 136/78 | HR 62 | Temp 97.5°F | Ht 64.0 in | Wt 146.0 lb

## 2017-07-30 DIAGNOSIS — K219 Gastro-esophageal reflux disease without esophagitis: Secondary | ICD-10-CM

## 2017-07-30 DIAGNOSIS — F0150 Vascular dementia without behavioral disturbance: Secondary | ICD-10-CM

## 2017-07-30 DIAGNOSIS — E034 Atrophy of thyroid (acquired): Secondary | ICD-10-CM | POA: Diagnosis not present

## 2017-07-30 DIAGNOSIS — E1165 Type 2 diabetes mellitus with hyperglycemia: Secondary | ICD-10-CM

## 2017-07-30 DIAGNOSIS — Z794 Long term (current) use of insulin: Secondary | ICD-10-CM

## 2017-07-30 LAB — TSH: TSH: 9.2 mIU/L — ABNORMAL HIGH (ref 0.40–4.50)

## 2017-07-30 NOTE — Progress Notes (Signed)
Patient ID: Allison [name], female   DOB: 1934/09/21, 83 y.o.   MRN: [id number]   Location:  [facility] OFFICE  Provider: DR Arletha Grippe  Code Status:  Goals of Care:  Advanced Directives 07/18/2017  Does Patient Have a Medical Advance Directive? No  Would patient like information on creating a medical advance directive? -     Chief Complaint  Patient presents with  . Medical Management of Chronic Issues    1 month follow-up on dementia, DM and HTN. Patient c/o stomach pain. Here with son   . Medication Refill    No refills needed   . Health Maintenance    Patient with pending appointment with [facility] to update eye exam 09/23/17     HPI: Patient is a 83 y.o. female seen today for medical management of chronic diseases.  She continues to miss several doses of her meds despite use of pillpacks. Son, Merry Proud, also notes that she misses her insulin doses frequently. She does not check her BS at home. Pt is a poor historian due to dementia. Hx obtained from chart. 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 She sleeps on the couch as her bed is "covered" with items.  She continues to miss doses and the pill pack she brought to the office today still has several doses from different days that have not been taken. She does have a home care aid to assist with baths 3 times per week. She has trouble with grooming and does not eat consistency. She completed AWV yesterday and had dropped 2 additional lbs. Weight reading inaccurate today as she was holding onto the scale. She is a poor historian due to dementia. Hx obtained from chart and son, [name].  HTN - BP improved; takes chlorthalidone and losartan. She checks BP at home.  hypothyroidism - uncontrolled. Unsure if she is taking levothyroxine.TSH 11.17; T4 free 1.0. (+) cold intolerance,increased anxiety/depression, palpitations  DM - uncontrolled. A1c 13.1% (prev 10.2%). BS elevated most days. She is  supposed to be taking metformin and novolog 70/30 - 24 units BID. Numbness in fingers. She saw eye specialist in Feb 2017. No diabetic changes seen. 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 Uses magnifying glass to read at times. She has an appt with eye specialist in Jul 2019. She reported angioedema with lantus and levemir. She takes gabapentin for neuropathy and losartan for renal protection. LDL 197; Tchol 276 (prev 307); HDL 56.   GERD - stable on pantoprazole  Vascular dementia - improved on 45m qhs of donepezil; weight down 10 lbs since Dec 2018.   Hypothyroidism - improving slowly but still uncontrolled 2/2 noncompliance with levothyroxine; TSH 11.17; T4 free 1.0    Past Medical History:  Diagnosis Date  . Carpal tunnel syndrome   . Diabetes mellitus without complication ([place]   . Dysphagia   . GERD (gastroesophageal reflux disease)   . Hiatal hernia   . HLD (hyperlipidemia)   . Hx of adenomatous colonic polyps 06/19/2014  . Hypertension   . Hypothyroidism   . Onychomycosis 03/05/2012  . Pain in lower limb 04/18/2013  . Sciatic nerve pain   . Vertigo     Past Surgical History:  Procedure Laterality Date  . BLADDER SURGERY     Mesh implant  . COLONOSCOPY    . LARYNX SURGERY     vocal cords  . PARTIAL HYSTERECTOMY  1978  . VESICOVAGINAL FISTULA CLOSURE W/ TAH  1976     reports that she has never smoked. She has never used smokeless tobacco. She reports that she does not drink alcohol or use drugs. Social History   Socioeconomic History  . Marital status: Single    Spouse name: Not on file  . Number of children: 2  . Years of education: Not on file  . Highest education level: Not on file  Occupational History  . Occupation: Retired  Scientific laboratory technician  . Financial resource strain: Not hard at all  . Food insecurity:    Worry: Never true    Inability: Never true  . Transportation needs:    Medical: No    Non-medical: No  Tobacco Use  . Smoking status: Never Smoker  . Smokeless  tobacco: Never Used  Substance and Sexual Activity  . Alcohol use: No    Alcohol/week: 0.0 oz  . Drug use: No  . Sexual activity: Never    Birth control/protection: Abstinence  Lifestyle  . Physical activity:    Days per week: 2 days    Minutes per session: 20 min  . Stress: Rather much  Relationships  . Social connections:    Talks on phone: More than three times a week    Gets together: More than three times a week    Attends religious service: More than 4 times per year    Active member of club or organization: No    Attends meetings of clubs or organizations: Never    Relationship status: Never married  . Intimate partner violence:    Fear of current or ex partner: No    Emotionally abused: No    Physically abused: No    Forced sexual activity: No  Other Topics Concern  . Not on file  Social History Narrative   Single   2 sons   Retired   2 cups caffeine daily   05/01/2014    Family History  Problem Relation Age of Onset  . Alzheimer's disease Mother   . Glaucoma Mother   . Heart disease Father   . Alzheimer's disease Father   . Asthma Sister   . Allergies Sister   . Allergies Sister   . Sleep apnea Sister   . Heart disease Sister   . Breast cancer Maternal Aunt   . Pancreatic cancer Maternal Aunt   . Prostate cancer Maternal Uncle   . Prostate cancer Paternal Uncle   . Alcoholism Maternal Uncle   . Kidney disease Maternal Aunt   . Heart disease Maternal Aunt     Allergies  Allergen Reactions  . Lantus [Insulin Glargine] Itching and Swelling    angioedema  . Levemir [Insulin Detemir] Itching and Swelling    angioedema  . Pineapple Anaphylaxis  . Codeine Other (See Comments)    unknown  . Invokana [Canagliflozin] Other (See Comments)    Constipation, nightmares  . Penicillins Other (See Comments)    Unknown   . Tradjenta [Linagliptin] Other (See Comments)    Unknown  . Vioxx [Rofecoxib] Other (See Comments)    unknown    Outpatient  Encounter Medications as of 07/30/2017  Medication Sig  . amLODipine (NORVASC) 10 MG tablet Take 1 tablet by mouth daily for blood pressure in the morning  . atorvastatin (LIPITOR) 40 MG tablet Take 1 tablet (40 mg total) by mouth at bedtime. For Cholesterol  . Blood Glucose Monitoring Suppl (ONETOUCH VERIO) w/Device KIT 1 each by Does not apply route 3 (three) times daily. Use to  test blood sugar three times daily. Dx: E11.21  . chlorthalidone (HYGROTON) 25 MG tablet Take 0.5 tablets (12.5 mg total) by mouth daily in the morning for High Blood Pressure  . citalopram (CELEXA) 10 MG tablet Take 1 tablet (10 mg total) by mouth daily For anxiety in the morning  . clopidogrel (PLAVIX) 75 MG tablet Take 1 tablet (75 mg total) by mouth daily in the morning for history of stroke  . donepezil (ARICEPT) 10 MG tablet Take 1 tablet (10 mg total) by mouth at bedtime. For memory  . gabapentin (NEURONTIN) 100 MG capsule Take 2 capsules (200 mg total) by mouth 3 (three) times daily as needed (nerve pain).  Marland Kitchen glucose blood (ONETOUCH VERIO) test strip Check blood sugar three times daily DX E11.21 (One Touch Verio IQ)  . insulin aspart protamine - aspart (NOVOLOG MIX 70/30 FLEXPEN) (70-30) 100 UNIT/ML FlexPen Inject 24 units into skin twice daily before meals . Dx E11.9  . Insulin Pen Needle (PEN NEEDLES) 31G X 5 MM MISC 1 each by Does not apply route as needed. Use when giving insulin injections. Dx: E11.9  . levothyroxine (SYNTHROID, LEVOTHROID) 150 MCG tablet take 1 tablet by mouth every morning ON AN EMPTY STOMACH for THROID in the morning  . losartan (COZAAR) 100 MG tablet Take 1 tablet (100 mg total) by mouth daily in the morning for High Blood Pressure  . meclizine (ANTIVERT) 25 MG tablet Take 1 tablet (25 mg total) by mouth 3 (three) times daily as needed for dizziness.  . metFORMIN (GLUCOPHAGE) 500 MG tablet take 2 tablets by mouth twice a day with food  . ondansetron (ZOFRAN ODT) 4 MG disintegrating tablet  Take 1 tablet (4 mg total) by mouth every 8 (eight) hours as needed for nausea or vomiting.  Glory Rosebush DELICA LANCETS 98P MISC Use to test blood sugar three times daily. Dx: E11.21  . pantoprazole (PROTONIX) 40 MG tablet Take 1 tablet (40 mg total) by mouth daily in the morning  . vitamin E (VITAMIN E) 1000 UNIT capsule Take 1,000 Units by mouth daily.   No facility-administered encounter medications on file as of 07/30/2017.     Review of Systems:  Review of Systems  Unable to perform ROS: Dementia    Health Maintenance  Topic Date Due  . TETANUS/TDAP  06/12/2013  . OPHTHALMOLOGY EXAM  09/30/2017 (Originally 12/30/1944)  . INFLUENZA VACCINE  08/12/2017  . HEMOGLOBIN A1C  11/26/2017  . FOOT EXAM  02/12/2018  . DEXA SCAN  Completed  . PNA vac Low Risk Adult  Completed    Physical Exam: Vitals:   07/30/17 0906  BP: 136/78  Pulse: 62  Temp: (!) 97.5 F (36.4 C)  TempSrc: Oral  SpO2: 99%  Weight: 146 lb (66.2 kg)  Height: _0  (1.626 m)   Body mass index is 25.06 kg/m. Physical Exam  Constitutional: She appears well-developed and well-nourished.  HENT:  Mouth/Throat: Oropharynx is clear and moist. No oropharyngeal exudate.  MMM; no oral thrush  Eyes: Pupils are equal, round, and reactive to light. No scleral icterus.  Neck: Neck supple. Carotid bruit is not present. No tracheal deviation present. No thyromegaly present.  Cardiovascular: Normal rate, regular rhythm and intact distal pulses. Exam reveals no gallop and no friction rub.  Murmur (1/6 SEM) heard. No LE edema b/l. no calf TTP.   Pulmonary/Chest: Effort normal and breath sounds normal. No stridor. No respiratory distress. She has no wheezes. She has no rales.  Abdominal: Soft.  Normal appearance and bowel sounds are normal. She exhibits no distension and no mass. There is no hepatomegaly. There is no tenderness. There is no rigidity, no rebound and no guarding. No hernia.  Musculoskeletal: She exhibits edema.    Lymphadenopathy:    She has no cervical adenopathy.  Neurological: She is alert. She has normal reflexes.  Skin: Skin is warm and dry. No rash noted.  Psychiatric: She has a normal mood and affect. Her behavior is normal. Judgment and thought content normal.    Labs reviewed: Basic Metabolic Panel: Recent Labs    12/10/16 1008  02/12/17 1059 05/26/17 1233 07/18/17 1317  NA 138   < > 141 140 138  K 4.0   < > 3.5 3.8 4.5  CL 101   < > 106 101 104  CO2 29   < > _0 GLUCOSE 311*   < > 216* 324* 338*  BUN 11   < > _1 CREATININE 0.87   < > 0.74 0.85 0.99  CALCIUM 9.6   < > 9.2 9.7 9.4  TSH 15.51*  --  12.94* 11.17*  --    < > = values in this interval not displayed.   Liver Function Tests: Recent Labs    12/22/16 2206 05/26/17 1233 07/18/17 1317  AST _2 ALT _3 ALKPHOS 72  --  58  BILITOT 0.9 0.8 1.0  PROT 7.3 6.8 6.9  ALBUMIN 4.1  --  3.9   Recent Labs    12/22/16 2206  LIPASE 20   No results for input(s): AMMONIA in the last 8760 hours. CBC: Recent Labs    12/22/16 2206 02/12/17 1059 05/26/17 1233 07/18/17 1317  WBC 13.4* 7.0 7.1 8.9  NEUTROABS 10.7* 3,864  --   --   HGB 14.0 13.1 13.4 13.6  HCT 40.8 38.8 39.8 41.5  MCV 80.2 80.3 80.4 82.3  PLT 253 235 249 239   Lipid Panel: Recent Labs    12/10/16 1008 05/26/17 1233  CHOL 307* 276*  HDL 50* 56  LDLCALC 225* 197*  TRIG 155* 103  CHOLHDL 6.1* 4.9   Lab Results  Component Value Date   HGBA1C 13.1 (H) 05/26/2017    Procedures since last visit: Ct Abdomen Pelvis W Contrast  Result Date: 07/18/2017 CLINICAL DATA:  Left side abdominal pain and diarrhea beginning this morning. EXAM: CT ABDOMEN AND PELVIS WITH CONTRAST TECHNIQUE: Multidetector CT imaging of the abdomen and pelvis was performed using the standard protocol following bolus administration of intravenous contrast. CONTRAST:  100 ml OMNIPAQUE IOHEXOL 300 MG/ML  SOLN COMPARISON:  None. FINDINGS: Lower chest:  Scattered subcentimeter nodules are seen in the lower lobes bilaterally including a subpleural nodule in the left lower lobe measuring 0.7 cm on image 5, a 0.4 cm right middle lobe subpleural nodule on image 1 and a 0.6 cm right middle lobe nodule also on image 1. Lung bases are otherwise clear. Heart size is normal. No pleural or pericardial effusion. Hepatobiliary: No focal liver abnormality is seen. No gallstones, gallbladder wall thickening, or biliary dilatation. Pancreas: Unremarkable. No pancreatic ductal dilatation or surrounding inflammatory changes. Spleen: Normal in size without focal abnormality. Adrenals/Urinary Tract: 2 small right and a single small left renal cysts are identified. 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 Urinary bladder is unremarkable. Stomach/Bowel: There is some thickening of the gastric antrum. A few sigmoid diverticula are seen  but there is no diverticulitis. The colon is otherwise normal in appearance. No evidence of appendicitis. The small bowel is normal appearance. Vascular/Lymphatic: Aortic atherosclerosis. No enlarged abdominal or pelvic lymph nodes. Reproductive: Status post hysterectomy. No adnexal masses. Other: No fluid collection. Musculoskeletal: No acute or focal bony abnormality. Mild appearing lumbar spondylosis is most notable at L4-5. IMPRESSION: Thickening of the gastric antrum is likely due to underdistention but could also reflect inflammatory process or much less likely infiltrating neoplasm. Subcentimeter pulmonary nodules in the lung bases as described above. Follow-up non-contrast CT recommended at 3-6 months to confirm persistence. If unchanged, and solid component remains <6 mm, annual CT is recommended until 5 years of stability has been established. If persistent these nodules should be considered highly suspicious if the solid component of the nodule is 6 mm or greater in size and  enlarging. This recommendation follows the consensus statement: Guidelines for Management of Incidental Pulmonary Nodules Detected on CT Images: From the Fleischner Society 2017; Radiology 2017; 284:228-243. Atherosclerosis. Mild diverticulosis without diverticulitis. Electronically Signed   By: Inge Rise M.D.   On: 07/18/2017 15:35    Assessment/Plan   ICD-10-CM   1. Hypothyroidism due to acquired atrophy of thyroid E03.4 TSH  2. Uncontrolled type 2 diabetes mellitus with hyperglycemia, with long-term current use of insulin (HCC) E11.65    Z79.4   3. Vascular dementia without behavioral disturbance F01.50   4. Gastroesophageal reflux disease without esophagitis K21.9    Highly recommended to son and pt that she seeks help for her hoarding problem. She insists that she has no problem but does not want others "touching my stuff"  PLEASE TAKE ALL MEDICATIONS AS ORDERED  Will call with lab results  Follow up in 1 month for DM, dementia, thyroid.    Joellen Tullos S. Perlie Gold  [facility] [address] [place], [place] [address] [phone] Cell (Monday-Friday 8 AM - 5 PM) [phone] After 5 PM and follow prompts

## 2017-07-30 NOTE — Patient Instructions (Addendum)
PLEASE TAKE ALL MEDICATIONS AS ORDERED  Will call with lab results  Follow up in 1 month for DM, dementia, thyroid.

## 2017-08-04 ENCOUNTER — Other Ambulatory Visit: Payer: Self-pay | Admitting: *Deleted

## 2017-08-04 NOTE — Patient Outreach (Signed)
[place] [facility]) Care Management  08/04/2017  Allison [name] 1934/06/22 [id number]   CSW was able to reach pt today for follow up from previous phone conversations with her son and with her(pt).  Pt reports she is doing "a lot better" and is not interested in pursuing placement. "What about that [facility] day [facility]". CSW discussed [facility] program with pt and will request Daneen Schick, [place], [facility] Social Work, Electrical engineer pt info on [employer] and contacts pt for further support and linking of resources. She reports her family is making sure she takes her RX as prescribed and she is encouraged to call and/or tour [facility] if desired.  CSW will plan to close referral for placement and refer pt to Daneen Schick, BSW, for further support and services.   Eduard Clos, MSW, [place] Worker  [place] [phone]

## 2017-08-06 ENCOUNTER — Other Ambulatory Visit: Payer: Self-pay

## 2017-08-06 NOTE — Patient Outreach (Signed)
[place] [facility]) Care Management  08/06/2017  Allison [name] Oct 07, 1934 [id number]  Successful outreach to the patient on today's date. BSW introduced self and the reason for today's call. Patient able to verify HIPAA. BSW discussed with the patient that CSW, Eduard Clos, requested this BSW educated the patient on [facility]. Patient is interested in program but unsure if she would like to enroll. BSW offered to mail the patient a [facility] brochure as well as a "FAQ" Fact Sheet obtained from [facility]. The patient is appreciative and requests items be mailed for her review.  BSW inquired if the patient has reliable transportation to physician appointments. The patient stated she currently does not have transportation as something is wrong with her car. BSW will also mail a printout indicating available transportation resources to seniors in [facility].   BSW will plan to outreach the patient in the next two weeks to follow up with the patient about today's mailing. BSW will plan to assist the patient with referrals to desired community resources.  Daneen Schick, BSW, CDP [facility] [phone]

## 2017-08-19 ENCOUNTER — Other Ambulatory Visit: Payer: Self-pay

## 2017-08-19 NOTE — Patient Outreach (Signed)
[place] [facility]) Care Management  08/19/2017  [name] 08/06/1934 [id number]  Successful outreach to the patient on today's date, HIPAA identifiers confirmed. The patient states she has received resources in the mail. Patient is very interested in [facility] program. BSW encouraged the patient to call [facility] to set up a tour so she and her son can learn more about the program. The patient stated understanding. BSW to perform a discipline closure on today's date as the patient has been provided all desired resources. No other social work needs have been identified.  Daneen Schick, BSW, CDP [facility] [phone]

## 2017-08-31 ENCOUNTER — Ambulatory Visit: Payer: Medicare Other | Admitting: Internal Medicine

## 2017-09-01 ENCOUNTER — Ambulatory Visit (INDEPENDENT_AMBULATORY_CARE_PROVIDER_SITE_OTHER): Payer: Medicare Other | Admitting: Podiatry

## 2017-09-01 ENCOUNTER — Encounter: Payer: Self-pay | Admitting: Internal Medicine

## 2017-09-01 ENCOUNTER — Encounter: Payer: Self-pay | Admitting: Podiatry

## 2017-09-01 DIAGNOSIS — B351 Tinea unguium: Secondary | ICD-10-CM | POA: Diagnosis not present

## 2017-09-01 DIAGNOSIS — M79672 Pain in left foot: Secondary | ICD-10-CM

## 2017-09-01 DIAGNOSIS — M79671 Pain in right foot: Secondary | ICD-10-CM | POA: Diagnosis not present

## 2017-09-01 NOTE — Progress Notes (Signed)
Subjective: 83 y.o. year old female patient presents complaining of painful nails. Patient requests toe nails trimmed.  Denies any new problems other than her last A1c was at 11.  Objective: Dermatologic: Thick yellow deformed nails x 10. Painful feet. Vascular: Pedal pulses are all palpable. Orthopedic: No gross deformities. Neurologic: Diagnosed with Diabetic neuropathy.  Assessment: Dystrophic mycotic nails x 10. Diabetic neuropathy.  Treatment: All mycotic nails debrided.  Return in 3 months or as needed.

## 2017-09-01 NOTE — Patient Instructions (Signed)
Seen for hypertrophic nails. All nails debrided. Return in 3 months or as needed.  

## 2017-09-09 ENCOUNTER — Telehealth: Payer: Self-pay | Admitting: *Deleted

## 2017-09-09 NOTE — Telephone Encounter (Signed)
Received fax from [place] for Prior Authorization form for Novolog Mix 70/30 Flex Pen. Printed last OV note and attached and placed in Dr. [place] folder to Review.

## 2017-09-23 ENCOUNTER — Encounter: Payer: Self-pay | Admitting: Nurse Practitioner

## 2017-09-23 DIAGNOSIS — H2513 Age-related nuclear cataract, bilateral: Secondary | ICD-10-CM | POA: Diagnosis not present

## 2017-09-23 DIAGNOSIS — Z794 Long term (current) use of insulin: Secondary | ICD-10-CM | POA: Diagnosis not present

## 2017-09-23 DIAGNOSIS — H25013 Cortical age-related cataract, bilateral: Secondary | ICD-10-CM | POA: Diagnosis not present

## 2017-09-23 DIAGNOSIS — E119 Type 2 diabetes mellitus without complications: Secondary | ICD-10-CM | POA: Diagnosis not present

## 2017-09-23 LAB — HM DIABETES EYE EXAM

## 2017-10-11 ENCOUNTER — Encounter: Payer: Self-pay | Admitting: Nurse Practitioner

## 2017-10-11 ENCOUNTER — Ambulatory Visit (INDEPENDENT_AMBULATORY_CARE_PROVIDER_SITE_OTHER): Payer: Medicare Other | Admitting: Nurse Practitioner

## 2017-10-11 VITALS — BP 136/84 | HR 87 | Temp 98.7°F | Ht 64.0 in | Wt 144.2 lb

## 2017-10-11 DIAGNOSIS — E785 Hyperlipidemia, unspecified: Secondary | ICD-10-CM

## 2017-10-11 DIAGNOSIS — F0150 Vascular dementia without behavioral disturbance: Secondary | ICD-10-CM

## 2017-10-11 DIAGNOSIS — E034 Atrophy of thyroid (acquired): Secondary | ICD-10-CM | POA: Diagnosis not present

## 2017-10-11 DIAGNOSIS — Z8673 Personal history of transient ischemic attack (TIA), and cerebral infarction without residual deficits: Secondary | ICD-10-CM

## 2017-10-11 DIAGNOSIS — E1165 Type 2 diabetes mellitus with hyperglycemia: Secondary | ICD-10-CM

## 2017-10-11 DIAGNOSIS — E1129 Type 2 diabetes mellitus with other diabetic kidney complication: Secondary | ICD-10-CM

## 2017-10-11 DIAGNOSIS — IMO0002 Reserved for concepts with insufficient information to code with codable children: Secondary | ICD-10-CM

## 2017-10-11 DIAGNOSIS — Z794 Long term (current) use of insulin: Secondary | ICD-10-CM

## 2017-10-11 DIAGNOSIS — Z23 Encounter for immunization: Secondary | ICD-10-CM | POA: Diagnosis not present

## 2017-10-11 NOTE — Patient Instructions (Signed)
Encourage her to take medication as prescribed

## 2017-10-11 NOTE — Progress Notes (Signed)
Careteam: Patient Care Team: Gildardo Cranker, DO as PCP - General (Internal Medicine)  Advanced Directive information Does Patient Have a Medical Advance Directive?: No  Allergies  Allergen Reactions  . Lantus [Insulin Glargine] Itching and Swelling    angioedema  . Levemir [Insulin Detemir] Itching and Swelling    angioedema  . Pineapple Anaphylaxis  . Codeine Other (See Comments)    unknown  . Invokana [Canagliflozin] Other (See Comments)    Constipation, nightmares  . Penicillins Other (See Comments)    Unknown   . Tradjenta [Linagliptin] Other (See Comments)    Unknown  . Vioxx [Rofecoxib] Other (See Comments)    unknown    Chief Complaint  Patient presents with  . Medical Management of Chronic Issues    pt is being seen to follow up on diabetes, thyroid, and dementia. Pt states that she is only taking her medications about half the time.   . Other    Son in room. he states that patient is rarely taking her medications at all.   . Audit C Screening    Score or 0     HPI: Patient is a 83 y.o. female seen in the office today for follow up. Pt was last seen on 07/30/17 and instructed to follow up in 1 month on TSH, DM and dementia.  Dementia has progressed significantly. Son reports she is not taking her medication at all. Pt reports she is taking "some of the time" Not taking any insulin.  Family encourages her to take medication but she will not. They will offer medication but she will decline.  Son with her at appt today, stating they are looking into placement for her at facility due to progression of memory loss and increase care needed.  TSH- TSH was elevated at 9.20 but was not taking her medication routinely. Synthroid 150 mcg prescribed.   DM- last a1c lab of 13.1 in May, on last OV she reported BS elevated most days and is supposed to be taking metformin and novolog 70/30 - 24 units BID. Following with ophthalmology without diabetic changes noted on last  exam. Taking gabapentin for diabetic neuropathy and losartan for renal protection.   Review of Systems:  Review of Systems  Unable to perform ROS: Dementia    Past Medical History:  Diagnosis Date  . Carpal tunnel syndrome   . Diabetes mellitus without complication ([place])   . Dysphagia   . GERD (gastroesophageal reflux disease)   . Hiatal hernia   . HLD (hyperlipidemia)   . Hx of adenomatous colonic polyps 06/19/2014  . Hypertension   . Hypothyroidism   . Onychomycosis 03/05/2012  . Pain in lower limb 04/18/2013  . Sciatic nerve pain   . Vertigo    Past Surgical History:  Procedure Laterality Date  . BLADDER SURGERY     Mesh implant  . COLONOSCOPY    . LARYNX SURGERY     vocal cords  . PARTIAL HYSTERECTOMY  1978  . VESICOVAGINAL FISTULA CLOSURE W/ TAH  1976   Social History:   reports that she has never smoked. She has never used smokeless tobacco. She reports that she does not drink alcohol or use drugs.  Family History  Problem Relation Age of Onset  . Alzheimer's disease Mother   . Glaucoma Mother   . Heart disease Father   . Alzheimer's disease Father   . Asthma Sister   . Allergies Sister   . Allergies Sister   . Sleep  apnea Sister   . Heart disease Sister   . Breast cancer Maternal Aunt   . Pancreatic cancer Maternal Aunt   . Prostate cancer Maternal Uncle   . Prostate cancer Paternal Uncle   . Alcoholism Maternal Uncle   . Kidney disease Maternal Aunt   . Heart disease Maternal Aunt     Medications: Patient's Medications  New Prescriptions   No medications on file  Previous Medications   AMLODIPINE (NORVASC) 10 MG TABLET    Take 1 tablet by mouth daily for blood pressure in the morning   ATORVASTATIN (LIPITOR) 40 MG TABLET    Take 1 tablet (40 mg total) by mouth at bedtime. For Cholesterol   BLOOD GLUCOSE MONITORING SUPPL (ONETOUCH VERIO) W/DEVICE KIT    1 each by Does not apply route 3 (three) times daily. Use to test blood sugar three times daily.  Dx: E11.21   CHLORTHALIDONE (HYGROTON) 25 MG TABLET    Take 0.5 tablets (12.5 mg total) by mouth daily in the morning for High Blood Pressure   CITALOPRAM (CELEXA) 10 MG TABLET    Take 1 tablet (10 mg total) by mouth daily For anxiety in the morning   CLOPIDOGREL (PLAVIX) 75 MG TABLET    Take 1 tablet (75 mg total) by mouth daily in the morning for history of stroke   DONEPEZIL (ARICEPT) 10 MG TABLET    Take 1 tablet (10 mg total) by mouth at bedtime. For memory   GABAPENTIN (NEURONTIN) 100 MG CAPSULE    Take 2 capsules (200 mg total) by mouth 3 (three) times daily as needed (nerve pain).   GLUCOSE BLOOD (ONETOUCH VERIO) TEST STRIP    Check blood sugar three times daily DX E11.21 (One Touch Verio IQ)   INSULIN ASPART PROTAMINE - ASPART (NOVOLOG MIX 70/30 FLEXPEN) (70-30) 100 UNIT/ML FLEXPEN    Inject 24 units into skin twice daily before meals . Dx E11.9   INSULIN PEN NEEDLE (PEN NEEDLES) 31G X 5 MM MISC    1 each by Does not apply route as needed. Use when giving insulin injections. Dx: E11.9   LEVOTHYROXINE (SYNTHROID, LEVOTHROID) 150 MCG TABLET    take 1 tablet by mouth every morning ON AN EMPTY STOMACH for THROID in the morning   LOSARTAN (COZAAR) 100 MG TABLET    Take 1 tablet (100 mg total) by mouth daily in the morning for High Blood Pressure   MECLIZINE (ANTIVERT) 25 MG TABLET    Take 1 tablet (25 mg total) by mouth 3 (three) times daily as needed for dizziness.   METFORMIN (GLUCOPHAGE) 500 MG TABLET    take 2 tablets by mouth twice a day with food   ONDANSETRON (ZOFRAN ODT) 4 MG DISINTEGRATING TABLET    Take 1 tablet (4 mg total) by mouth every 8 (eight) hours as needed for nausea or vomiting.   ONETOUCH DELICA LANCETS 48G MISC    Use to test blood sugar three times daily. Dx: E11.21   PANTOPRAZOLE (PROTONIX) 40 MG TABLET    Take 1 tablet (40 mg total) by mouth daily in the morning   VITAMIN E (VITAMIN E) 1000 UNIT CAPSULE    Take 1,000 Units by mouth daily.  Modified Medications   No  medications on file  Discontinued Medications   No medications on file     Physical Exam:  Vitals:   10/11/17 1310  BP: 136/84  Pulse: 87  Temp: 98.7 F (37.1 C)  TempSrc: Oral  SpO2: 98%  Weight: 144 lb 3.2 oz (65.4 kg)  Height: _0  (1.626 m)   Body mass index is 24.75 kg/m.  Physical Exam  Constitutional: She appears well-developed.  Frail appearing in NAD  HENT:  Head: Normocephalic and atraumatic.  Neck: Normal range of motion.  Cardiovascular: Normal rate, regular rhythm and intact distal pulses. Exam reveals no gallop and no friction rub.  Murmur (1/6 SEM) heard. No LE edema b/l; no calf TTP  Pulmonary/Chest: Effort normal and breath sounds normal. No stridor. No respiratory distress. She has no wheezes. She has no rales.  Abdominal: Soft. Bowel sounds are normal.  Musculoskeletal: She exhibits no edema.  Neurological: She is alert.  Skin: Skin is warm and dry. No rash noted.  Psychiatric: She has a normal mood and affect.    Labs reviewed: Basic Metabolic Panel: Recent Labs    02/12/17 1059 05/26/17 1233 07/18/17 1317 07/30/17 1025  NA 141 140 138  --   K 3.5 3.8 4.5  --   CL 106 101 104  --   CO2 _1 --   GLUCOSE 216* 324* 338*  --   BUN _2 --   CREATININE 0.74 0.85 0.99  --   CALCIUM 9.2 9.7 9.4  --   TSH 12.94* 11.17*  --  9.20*   Liver Function Tests: Recent Labs    12/22/16 2206 05/26/17 1233 07/18/17 1317  AST _3 ALT _4 ALKPHOS 72  --  58  BILITOT 0.9 0.8 1.0  PROT 7.3 6.8 6.9  ALBUMIN 4.1  --  3.9   Recent Labs    12/22/16 2206  LIPASE 20   No results for input(s): AMMONIA in the last 8760 hours. CBC: Recent Labs    12/22/16 2206 02/12/17 1059 05/26/17 1233 07/18/17 1317  WBC 13.4* 7.0 7.1 8.9  NEUTROABS 10.7* 3,864  --   --   HGB 14.0 13.1 13.4 13.6  HCT 40.8 38.8 39.8 41.5  MCV 80.2 80.3 80.4 82.3  PLT 253 235 249 239   Lipid Panel: Recent Labs    12/10/16 1008 05/26/17 1233  CHOL  307* 276*  HDL 50* 56  LDLCALC 225* 197*  TRIG 155* 103  CHOLHDL 6.1* 4.9   TSH: Recent Labs    02/12/17 1059 05/26/17 1233 07/30/17 1025  TSH 12.94* 11.17* 9.20*   A1C: Lab Results  Component Value Date   HGBA1C 13.1 (H) 05/26/2017     Assessment/Plan 1. Need for influenza vaccination - Flu vaccine HIGH DOSE PF (Fluzone High dose)  2. Hypothyroidism due to acquired atrophy of thyroid Not taking routine, explained in detail the importance of taking medication regularly  3. DM type 2, uncontrolled, with renal complications ([place]) Not taking insulin or metformin. blood sugars remain elevated and diet uncontrolled. Discussed the long term effects of elevated blood sugars, pt and son voiced understanding.  - COMPLETE METABOLIC PANEL WITH GFR  4. Uncontrolled type 2 diabetes mellitus with hyperglycemia, with long-term current use of insulin ([facility])  Ongoing hyperglycemia, not taking medication regimen despite education   5. Vascular dementia without behavioral disturbance ([facility]) Worsening. Elevated blood sugars and uncontrolled thyroid most likely contributing however family has attempted to get her to take medication and she is not willing. Family continues to encouraging her and keeping her safe. Looking into long term placement to help with care and medications.   6. History of stroke Educated on risk of another CVA due to  medication noncompliance and uncontrolled disease state. Son and pt voice understanding.   7. Hyperlipidemia LDL goal <100 not taking lipitor routinely and uncontrolled diet.  - COMPLETE METABOLIC PANEL WITH GFR  Next appt: 02/10/2018, sooner if needed  Zi Newbury K. [place], [facility] [phone]

## 2017-10-12 LAB — COMPLETE METABOLIC PANEL WITH GFR
AG RATIO: 1.5 (calc) (ref 1.0–2.5)
ALKALINE PHOSPHATASE (APISO): 70 U/L (ref 33–130)
ALT: 11 U/L (ref 6–29)
AST: 12 U/L (ref 10–35)
Albumin: 4 g/dL (ref 3.6–5.1)
BUN/Creatinine Ratio: 10 (calc) (ref 6–22)
BUN: 9 mg/dL (ref 7–25)
CO2: 29 mmol/L (ref 20–32)
Calcium: 9.6 mg/dL (ref 8.6–10.4)
Chloride: 98 mmol/L (ref 98–110)
Creat: 0.89 mg/dL — ABNORMAL HIGH (ref 0.60–0.88)
GFR, Est African American: 71 mL/min/{1.73_m2} (ref >=60)
GFR, Est Non African American: 61 mL/min/{1.73_m2} (ref >=60)
GLOBULIN: 2.7 g/dL (ref 1.9–3.7)
Glucose, Bld: 385 mg/dL — ABNORMAL HIGH (ref 65–139)
POTASSIUM: 4 mmol/L (ref 3.5–5.3)
SODIUM: 137 mmol/L (ref 135–146)
Total Bilirubin: 0.7 mg/dL (ref 0.2–1.2)
Total Protein: 6.7 g/dL (ref 6.1–8.1)

## 2017-10-15 ENCOUNTER — Emergency Department (HOSPITAL_COMMUNITY): Payer: Medicare Other

## 2017-10-15 ENCOUNTER — Emergency Department (HOSPITAL_COMMUNITY)
Admission: EM | Admit: 2017-10-15 | Discharge: 2017-10-15 | Disposition: A | Discharge status: Home / Self Care | Payer: Medicare Other | Attending: Emergency Medicine | Admitting: Emergency Medicine

## 2017-10-15 ENCOUNTER — Encounter (HOSPITAL_COMMUNITY): Payer: Self-pay | Admitting: Emergency Medicine

## 2017-10-15 DIAGNOSIS — R443 Hallucinations, unspecified: Secondary | ICD-10-CM | POA: Diagnosis not present

## 2017-10-15 DIAGNOSIS — F0390 Unspecified dementia without behavioral disturbance: Secondary | ICD-10-CM | POA: Diagnosis not present

## 2017-10-15 DIAGNOSIS — R197 Diarrhea, unspecified: Secondary | ICD-10-CM | POA: Diagnosis not present

## 2017-10-15 DIAGNOSIS — E119 Type 2 diabetes mellitus without complications: Secondary | ICD-10-CM | POA: Insufficient documentation

## 2017-10-15 DIAGNOSIS — Z7902 Long term (current) use of antithrombotics/antiplatelets: Secondary | ICD-10-CM | POA: Diagnosis not present

## 2017-10-15 DIAGNOSIS — I10 Essential (primary) hypertension: Secondary | ICD-10-CM | POA: Insufficient documentation

## 2017-10-15 DIAGNOSIS — Z79899 Other long term (current) drug therapy: Secondary | ICD-10-CM | POA: Diagnosis not present

## 2017-10-15 DIAGNOSIS — R260 Ataxic gait: Secondary | ICD-10-CM | POA: Insufficient documentation

## 2017-10-15 DIAGNOSIS — R531 Weakness: Secondary | ICD-10-CM | POA: Insufficient documentation

## 2017-10-15 DIAGNOSIS — R42 Dizziness and giddiness: Secondary | ICD-10-CM | POA: Diagnosis not present

## 2017-10-15 DIAGNOSIS — R32 Unspecified urinary incontinence: Secondary | ICD-10-CM | POA: Diagnosis not present

## 2017-10-15 DIAGNOSIS — Z9114 Patient's other noncompliance with medication regimen: Secondary | ICD-10-CM | POA: Diagnosis not present

## 2017-10-15 DIAGNOSIS — Z794 Long term (current) use of insulin: Secondary | ICD-10-CM | POA: Insufficient documentation

## 2017-10-15 DIAGNOSIS — E039 Hypothyroidism, unspecified: Secondary | ICD-10-CM | POA: Diagnosis not present

## 2017-10-15 DIAGNOSIS — R51 Headache: Secondary | ICD-10-CM | POA: Insufficient documentation

## 2017-10-15 LAB — DIFFERENTIAL
Abs Immature Granulocytes: 0 10*3/uL (ref 0.0–0.1)
Basophils Absolute: 0.1 10*3/uL (ref 0.0–0.1)
Basophils Relative: 1 %
Eosinophils Absolute: 0.2 10*3/uL (ref 0.0–0.7)
Eosinophils Relative: 2 %
Immature Granulocytes: 0 %
Lymphocytes Relative: 25 %
Lymphs Abs: 1.8 10*3/uL (ref 0.7–4.0)
Monocytes Absolute: 0.9 10*3/uL (ref 0.1–1.0)
Monocytes Relative: 12 %
Neutro Abs: 4.4 10*3/uL (ref 1.7–7.7)
Neutrophils Relative %: 60 %

## 2017-10-15 LAB — CBC
HEMATOCRIT: 43.4 % (ref 36.0–46.0)
HEMOGLOBIN: 14.1 g/dL (ref 12.0–15.0)
MCH: 27.2 pg (ref 26.0–34.0)
MCHC: 32.5 g/dL (ref 30.0–36.0)
MCV: 83.8 fL (ref 78.0–100.0)
PLATELETS: 240 10*3/uL (ref 150–400)
RBC: 5.18 MIL/uL — ABNORMAL HIGH (ref 3.87–5.11)
RDW: 12.7 % (ref 11.5–15.5)
WBC: 7.4 10*3/uL (ref 4.0–10.5)

## 2017-10-15 LAB — CBG MONITORING, ED: Glucose-Capillary: 377 mg/dL — ABNORMAL HIGH (ref 70–99)

## 2017-10-15 LAB — RAPID URINE DRUG SCREEN, HOSP PERFORMED
Amphetamines: NOT DETECTED
Barbiturates: NOT DETECTED
Benzodiazepines: NOT DETECTED
Cocaine: NOT DETECTED
Opiates: NOT DETECTED
Tetrahydrocannabinol: NOT DETECTED

## 2017-10-15 LAB — COMPREHENSIVE METABOLIC PANEL WITH GFR
ALT: 14 U/L (ref 0–44)
AST: 16 U/L (ref 15–41)
Albumin: 4 g/dL (ref 3.5–5.0)
Alkaline Phosphatase: 68 U/L (ref 38–126)
Anion gap: 15 (ref 5–15)
BUN: 9 mg/dL (ref 8–23)
CO2: 26 mmol/L (ref 22–32)
Calcium: 10.3 mg/dL (ref 8.9–10.3)
Chloride: 97 mmol/L — ABNORMAL LOW (ref 98–111)
Creatinine, Ser: 1.01 mg/dL — ABNORMAL HIGH (ref 0.44–1.00)
GFR calc Af Amer: 59 mL/min — ABNORMAL LOW
GFR calc non Af Amer: 51 mL/min — ABNORMAL LOW
Glucose, Bld: 363 mg/dL — ABNORMAL HIGH (ref 70–99)
Potassium: 3.8 mmol/L (ref 3.5–5.1)
Sodium: 138 mmol/L (ref 135–145)
Total Bilirubin: 1.1 mg/dL (ref 0.3–1.2)
Total Protein: 7.1 g/dL (ref 6.5–8.1)

## 2017-10-15 LAB — URINALYSIS, ROUTINE W REFLEX MICROSCOPIC
Bilirubin Urine: NEGATIVE
Glucose, UA: >=500 mg/dL — AB
Hgb urine dipstick: NEGATIVE
Ketones, ur: 5 mg/dL — AB
Nitrite: NEGATIVE
Protein, ur: NEGATIVE mg/dL
Specific Gravity, Urine: 1.017 (ref 1.005–1.030)
pH: 5 (ref 5.0–8.0)

## 2017-10-15 LAB — PROTIME-INR
INR: 1
PROTHROMBIN TIME: 13.1 s (ref 11.4–15.2)

## 2017-10-15 LAB — I-STAT CHEM 8, ED
BUN: 11 mg/dL (ref 8–23)
CALCIUM ION: 1.25 mmol/L (ref 1.15–1.40)
CHLORIDE: 100 mmol/L (ref 98–111)
Creatinine, Ser: 0.9 mg/dL (ref 0.44–1.00)
GLUCOSE: 374 mg/dL — AB (ref 70–99)
HCT: 44 % (ref 36.0–46.0)
Hemoglobin: 15 g/dL (ref 12.0–15.0)
Potassium: 3.9 mmol/L (ref 3.5–5.1)
Sodium: 138 mmol/L (ref 135–145)
TCO2: 26 mmol/L (ref 22–32)

## 2017-10-15 LAB — APTT: aPTT: 25 s (ref 24–36)

## 2017-10-15 LAB — I-STAT TROPONIN, ED: Troponin i, poc: 0 ng/mL (ref 0.00–0.08)

## 2017-10-15 LAB — ETHANOL: Alcohol, Ethyl (B): <10 mg/dL (ref <10)

## 2017-10-15 NOTE — ED Triage Notes (Signed)
Pt reports increase in falls lately, reports that she had a stroke several weeks ago. States yesterday she started having dizziness and feeling like "I'm being pulled to the right." Family reports she's been noncompliant with her medicines for several months

## 2017-10-15 NOTE — ED Provider Notes (Signed)
[facility] EMERGENCY DEPARTMENT Provider Note   CSN: [id number] Arrival date & time: 10/15/17  0522     History   Chief Complaint Chief Complaint  Patient presents with  . Fall  . Medical Management of Chronic Issues    noncompliant with medication for several months    HPI Allison [name] is a 83 y.o. female.  Patient with history of diabetes, reported history of stroke (basal ganglia, internal capsule), dementia, medication non-complicance -- presents today with multiple complaints.  She is a poor historian.  Patient states that she has been having dizzy spells.  It is tough for her to describe these but seems to be most like an off-balance sensation with walking.  She reports 2 days of persistent watery, nonbloody stool.  She started having a headache at approximately midnight last night while she was looking for football scores.  Headache is generalized.  No reported chest pains or abdominal pains.  She reports having intermittent weakness in R arm and reports drifting to the right side.  Patient denies other signs of stroke including: facial droop, slurred speech, aphasia, weakness/numbness in extremities.  The onset of this condition was acute. The course is constant. Aggravating factors: none. Alleviating factors: none.   MRA (2017): Severe distal right vertebral artery stenosis. Up to moderate stenoses also in the basilar artery, left ICA supraclinoid segment, left MCA origin, left ACA origin.      Past Medical History:  Diagnosis Date  . Carpal tunnel syndrome   . Diabetes mellitus without complication ([place])   . Dysphagia   . GERD (gastroesophageal reflux disease)   . Hiatal hernia   . HLD (hyperlipidemia)   . Hx of adenomatous colonic polyps 06/19/2014  . Hypertension   . Hypothyroidism   . Onychomycosis 03/05/2012  . Pain in lower limb 04/18/2013  . Sciatic nerve pain   . Vertigo     Patient Active Problem List   Diagnosis Date Noted  .  History of stroke 10/01/2016  . Anxiety state 06/03/2016  . ARI (acute respiratory infection) 01/22/2016  . Uncontrolled type 2 diabetes mellitus with hyperglycemia, with long-term current use of insulin ([place]) 10/29/2015  . Acute ischemic stroke ([place]) 10/29/2015  . Hallucinations   . Visual hallucinations 10/28/2015  . Stroke-like symptoms 10/28/2015  . CVA (cerebral vascular accident) ([place]) 10/28/2015  . Ischemic stroke ([place])   . Diabetes mellitus with complication ([place])   . Hx of adenomatous colonic polyps 06/19/2014  . Neuropathic pain 03/27/2014  . Uncontrolled type 2 diabetes with neuropathy ([place]) 03/27/2014  . Bilateral carpal tunnel syndrome 03/27/2014  . AKI (acute kidney injury) ([place]) 08/05/2013  . DM type 2, uncontrolled, with renal complications ([place]) 10/18/1217  . Hypercalcemia 08/05/2013  . PVD (peripheral vascular disease) ([place]) 03/05/2012  . Onychomycosis 03/05/2012  . Other hammer toe (acquired) 03/05/2012  . Abnormal CXR 12/25/2011  . Hypothyroidism 06/23/2006  . Dyslipidemia 06/23/2006  . Essential hypertension 06/23/2006  . GERD 06/23/2006  . IBS 06/23/2006    Past Surgical History:  Procedure Laterality Date  . BLADDER SURGERY     Mesh implant  . COLONOSCOPY    . LARYNX SURGERY     vocal cords  . PARTIAL HYSTERECTOMY  1978  . VESICOVAGINAL FISTULA CLOSURE W/ TAH  1976     OB History   None      Home Medications    Prior to Admission medications   Medication Sig Start Date End Date Taking? Authorizing Provider  amLODipine (NORVASC) 10 MG tablet Take 1 tablet by mouth daily for blood pressure in the morning 06/02/17   Gildardo Cranker, DO  atorvastatin (LIPITOR) 40 MG tablet Take 1 tablet (40 mg total) by mouth at bedtime. For Cholesterol 06/02/17   Gildardo Cranker, DO  Blood Glucose Monitoring Suppl (ONETOUCH VERIO) w/Device KIT 1 each by Does not apply route 3 (three) times daily. Use to test blood sugar three times daily. Dx: E11.21 06/15/17   Gildardo Cranker, DO  chlorthalidone (HYGROTON) 25 MG tablet Take 0.5 tablets (12.5 mg total) by mouth daily in the morning for High Blood Pressure 06/02/17   Gildardo Cranker, DO  citalopram (CELEXA) 10 MG tablet Take 1 tablet (10 mg total) by mouth daily For anxiety in the morning 06/02/17   Gildardo Cranker, DO  clopidogrel (PLAVIX) 75 MG tablet Take 1 tablet (75 mg total) by mouth daily in the morning for history of stroke 06/15/17   Gildardo Cranker, DO  donepezil (ARICEPT) 10 MG tablet Take 1 tablet (10 mg total) by mouth at bedtime. For memory 06/29/17   Gildardo Cranker, DO  gabapentin (NEURONTIN) 100 MG capsule Take 2 capsules (200 mg total) by mouth 3 (three) times daily as needed (nerve pain). 06/15/17   Gildardo Cranker, DO  glucose blood [facility] VERIO) test strip Check blood sugar three times daily DX E11.21 (One Touch Verio IQ) 06/15/17   Gildardo Cranker, DO  insulin aspart protamine - aspart (NOVOLOG MIX 70/30 FLEXPEN) (70-30) 100 UNIT/ML FlexPen Inject 24 units into skin twice daily before meals . Dx E11.9 Patient not taking: Reported on 10/11/2017 06/15/17   Gildardo Cranker, DO  Insulin Pen Needle (PEN NEEDLES) 31G X 5 MM MISC 1 each by Does not apply route as needed. Use when giving insulin injections. Dx: E11.9 06/15/17   Gildardo Cranker, DO  levothyroxine (SYNTHROID, LEVOTHROID) 150 MCG tablet take 1 tablet by mouth every morning ON AN EMPTY STOMACH for THROID in the morning 06/02/17   Gildardo Cranker, DO  losartan (COZAAR) 100 MG tablet Take 1 tablet (100 mg total) by mouth daily in the morning for High Blood Pressure 06/02/17   Gildardo Cranker, DO  meclizine (ANTIVERT) 25 MG tablet Take 1 tablet (25 mg total) by mouth 3 (three) times daily as needed for dizziness. 10/13/16   Gildardo Cranker, DO  metFORMIN (GLUCOPHAGE) 500 MG tablet take 2 tablets by mouth twice a day with food 06/15/17   Gildardo Cranker, DO  ondansetron (ZOFRAN ODT) 4 MG disintegrating tablet Take 1 tablet (4 mg total) by mouth every 8 (eight) hours as  needed for nausea or vomiting. 07/18/17   Carmin Muskrat, MD  [facility] DELICA LANCETS 57D MISC Use to test blood sugar three times daily. Dx: E11.21 06/15/17   Gildardo Cranker, DO  pantoprazole (PROTONIX) 40 MG tablet Take 1 tablet (40 mg total) by mouth daily in the morning 06/02/17   Gildardo Cranker, DO  vitamin E (VITAMIN E) 1000 UNIT capsule Take 1,000 Units by mouth daily.    [provider]    Family History Family History  Problem Relation Age of Onset  . Alzheimer's disease Mother   . Glaucoma Mother   . Heart disease Father   . Alzheimer's disease Father   . Asthma Sister   . Allergies Sister   . Allergies Sister   . Sleep apnea Sister   . Heart disease Sister   . Breast cancer Maternal Aunt   . Pancreatic cancer Maternal Aunt   . Prostate  cancer Maternal Uncle   . Prostate cancer Paternal Uncle   . Alcoholism Maternal Uncle   . Kidney disease Maternal Aunt   . Heart disease Maternal Aunt     Social History Social History   Tobacco Use  . Smoking status: Never Smoker  . Smokeless tobacco: Never Used  Substance Use Topics  . Alcohol use: No    Alcohol/week: 0.0 standard drinks  . Drug use: No     Allergies   Lantus [insulin glargine]; Levemir [insulin detemir]; Pineapple; Codeine; Invokana [canagliflozin]; Penicillins; Tradjenta [linagliptin]; and Vioxx [rofecoxib]   Review of Systems Review of Systems  Constitutional: Negative for fever.  HENT: Negative for rhinorrhea and sore throat.   Eyes: Negative for redness.  Respiratory: Negative for cough.   Cardiovascular: Negative for chest pain.  Gastrointestinal: Positive for diarrhea. Negative for abdominal pain, nausea and vomiting.  Genitourinary: Negative for dysuria.  Musculoskeletal: Positive for gait problem. Negative for myalgias.  Skin: Negative for rash.  Neurological: Positive for weakness and headaches.  Psychiatric/Behavioral: Positive for hallucinations (Talking to mother who is deceased).      Physical Exam Updated Vital Signs BP (!) 180/95 (BP Location: Right Arm)   Pulse 90   Temp 98.3 F (36.8 C) (Oral)   Resp 16   Ht '5\' 4"'  (1.626 m)   Wt 64.4 kg   SpO2 98%   BMI 24.37 kg/m   Physical Exam  Constitutional: She is oriented to person, place, and time. She appears well-developed and well-nourished.  HENT:  Head: Normocephalic and atraumatic.  Right Ear: Tympanic membrane, external ear and ear canal normal.  Left Ear: Tympanic membrane, external ear and ear canal normal.  Nose: Nose normal.  Mouth/Throat: Uvula is midline, oropharynx is clear and moist and mucous membranes are normal.  Eyes: Pupils are equal, round, and reactive to light. Conjunctivae, EOM and lids are normal. Right eye exhibits no nystagmus. Left eye exhibits no nystagmus.  Neck: Normal range of motion. Neck supple.  Cardiovascular: Normal rate and regular rhythm.  Pulmonary/Chest: Effort normal and breath sounds normal.  Abdominal: Soft. There is no tenderness.  Musculoskeletal:       Cervical back: She exhibits normal range of motion, no tenderness and no bony tenderness.  Neurological: She is alert and oriented to person, place, and time. She has normal strength and normal reflexes. No cranial nerve deficit or sensory deficit. She displays a negative Romberg sign. Gait abnormal. Coordination normal. GCS eye subscore is 4. GCS verbal subscore is 5. GCS motor subscore is 6.  Needs assistance to ambulate. She has trouble maintaining her balance. No pronator drift.   Skin: Skin is warm and dry.  Psychiatric: She has a normal mood and affect.  Nursing note and vitals reviewed.    ED Treatments / Results  Labs (all labs ordered are listed, but only abnormal results are displayed) Labs Reviewed  CBC - Abnormal; Notable for the following components:      Result Value   RBC 5.18 (*)    All other components within normal limits  COMPREHENSIVE METABOLIC PANEL - Abnormal; Notable for the  following components:   Chloride 97 (*)    Glucose, Bld 363 (*)    Creatinine, Ser 1.01 (*)    GFR calc non Af Amer 51 (*)    GFR calc Af Amer 59 (*)    All other components within normal limits  URINALYSIS, ROUTINE W REFLEX MICROSCOPIC - Abnormal; Notable for the following components:   Color,  Urine STRAW (*)    Glucose, UA >=500 (*)    Ketones, ur 5 (*)    Leukocytes, UA MODERATE (*)    Bacteria, UA RARE (*)    Non Squamous Epithelial 0-5 (*)    All other components within normal limits  CBG MONITORING, ED - Abnormal; Notable for the following components:   Glucose-Capillary 377 (*)    All other components within normal limits  I-STAT CHEM 8, ED - Abnormal; Notable for the following components:   Glucose, Bld 374 (*)    All other components within normal limits  URINE CULTURE  ETHANOL  PROTIME-INR  APTT  DIFFERENTIAL  RAPID URINE DRUG SCREEN, HOSP PERFORMED  I-STAT TROPONIN, ED    EKG EKG Interpretation  Date/Time:  Friday October 15 2017 05:44:21 EDT Ventricular Rate:  80 PR Interval:  170 QRS Duration: 64 QT Interval:  376 QTC Calculation: 433 R Axis:   13 Text Interpretation:  Normal sinus rhythm Septal infarct , age undetermined Abnormal ECG Interpretation limited secondary to artifact Confirmed by Ripley Fraise ([id number]) on 10/15/2017 6:38:44 AM   Radiology Ct Head Wo Contrast  Result Date: 10/15/2017 CLINICAL DATA:  Increased number of recent falls. Stroke several weeks ago. Dizziness. EXAM: CT HEAD WITHOUT CONTRAST TECHNIQUE: Contiguous axial images were obtained from the base of the skull through the vertex without intravenous contrast. COMPARISON:  06/11/2017; 04/02/2016; brain MRI-11/01/2016 FINDINGS: Brain: Similar findings of atrophy with sulcal prominence centralized volume loss with commensurate ex vacuo dilatation of the ventricular system. Scattered periventricular hypodensities compatible with microvascular ischemic disease. No CT evidence of acute  large territory infarct. No intraparenchymal or extra-axial mass or hemorrhage. No midline shift. Vascular: Intracranial atherosclerosis. Skull: No displaced calvarial fracture. Sinuses/Orbits: There is circumferential mucosal thickening involving the bilateral sphenoid sinuses, right greater than left. The remaining paranasal sinuses and mastoid air cells are normally aerated. No air-fluid levels. Other: Regional soft tissues appear normal. IMPRESSION: Similar findings of atrophy and microvascular ischemic disease without superimposed acute intracranial process. Electronically Signed   By: Sandi Mariscal M.D.   On: 10/15/2017 07:42   Mr Brain Wo Contrast  Result Date: 10/15/2017 CLINICAL DATA:  Falling recently. Previous history of stroke. Dizziness beginning yesterday. EXAM: MRI HEAD WITHOUT CONTRAST TECHNIQUE: Multiplanar, multiecho pulse sequences of the brain and surrounding structures were obtained without intravenous contrast. COMPARISON:  CT same day.  MRI 11/11/2016 FINDINGS: Brain: Diffusion imaging shows a 3-4 mm focus of questionable restricted diffusion in the right posterior frontal white matter. There is not convincing low signal on the ADC map, and this could be a subacute small vessel white matter infarction or a focus of T2 shine through related to an older insult. No other potentially acute finding. Elsewhere, there chronic small-vessel ischemic changes of the pons. No focal cerebellar insult. Cerebral hemispheres show moderate chronic small-vessel insults of the deep and subcortical white matter. No cortical or large vessel territory infarction. No mass lesion, hemorrhage, hydrocephalus or extra-axial collection. Vascular: Major vessels at the base of the brain show flow. Skull and upper cervical spine: Negative Sinuses/Orbits: Inflammatory changes of the right division of the sphenoid sinus. Other sinuses are clear. Orbits are negative. Other: None IMPRESSION: 3-4 mm focus of questionable  restricted diffusion in the right posterior frontal white matter. This could possibly be a subacute small vessel white matter infarction or could be a focus of T2 shine through signal related to an older insult. No other potentially acute finding. Moderate chronic small-vessel ischemic changes elsewhere  affecting the brain. Electronically Signed   By: Nelson Chimes M.D.   On: 10/15/2017 10:02    Procedures Procedures (including critical care time)  Medications Ordered in ED Medications - No data to display   Initial Impression / Assessment and Plan / ED Course  I have reviewed the triage vital signs and the nursing notes.  Pertinent labs & imaging results that were available during my care of the patient were reviewed by me and considered in my medical decision making (see chart for details).     Patient seen and examined. Work-up initiated.   Vital signs reviewed and are as follows: BP (!) 180/95 (BP Location: Right Arm)   Pulse 90   Temp 98.3 F (36.8 C) (Oral)   Resp 16   Ht '5\' 4"'  (1.626 m)   Wt 64.4 kg   SpO2 98%   BMI 24.37 kg/m   11:30 AM Discussed case previously with Dr. Francia Greaves who has seen patient.   Patient has ambulated to the restroom.   Reviewed MRI results with on-call neurology Dr. Leonel Ramsay.  He has reviewed MRI imaging.  He agrees no signs of acute stroke.  Patient updated.  She is comfortable with discharged home.  I discussed with the patient the importance of taking her medications as this helps prevent strokes, heart attacks, and other issues.  Urine pending and returned without definite infection.  Culture sent.  Patient does not have clinical UTI.  Patient is comfortable with returning to home as well as her son.  Encourage PCP follow-up in the next 3 days for recheck and further evaluation as needed.  Patient counseled to return if they have weakness in their arms or legs, slurred speech, trouble walking or talking, confusion, trouble with their  balance, or if they have any other concerns. Patient verbalizes understanding and agrees with plan.    Final Clinical Impressions(s) / ED Diagnoses   Final diagnoses:  Dizziness  Diarrhea, unspecified type   Patient with history of stroke presents with reported ataxic gait and also weakness.  She does not have any objective weakness on exam here.  She had a headache that was unusual and also was having diarrhea.  Electrolytes are okay.  Normal kidney function.  She had CT of the head and MRI of the brain which did not demonstrate any acute strokes or bleeding.  Patient has been well during her emergency department stay and has ambulated.  Her blood sugars are a bit elevated without signs of DKA, likely due to medication noncompliance.  We discussed importance of taking her medications as prescribed by her doctor.  ED Discharge Orders    None       Carlisle Cater, Hershal Coria 10/15/17 1132    Valarie Merino, MD 10/16/17 1152

## 2017-10-15 NOTE — ED Notes (Signed)
Patient ambulatory to bathroom with steady gait at this time to provide urine sample. Pt did experience urinary incontinence on the way to the bathroom, may not be able to obtain sample.

## 2017-10-15 NOTE — ED Notes (Signed)
Pt states she is not claustrophobic, transported to MRI

## 2017-10-15 NOTE — ED Notes (Signed)
ED Provider at bedside. 

## 2017-10-15 NOTE — Discharge Instructions (Signed)
Please read and follow all provided instructions.  Your diagnoses today include:  1. Dizziness   2. Diarrhea, unspecified type     Tests performed today include:  Blood counts and electrolytes  CT of your brain that did not show any bleeding  MRI of your brain that did not show any new stroke  EKG and blood test for heart attack  Urine test - no definite infection  Vital signs. See below for your results today.   Medications prescribed:   None  Take any prescribed medications only as directed.  Home care instructions:  Follow any educational materials contained in this packet.  Please take all of your medications as prescribed by your doctor.  This is helping to control your chronic medical conditions.  If you do not take your medications regularly, you may have complications such as heart attack or stroke.  BE VERY CAREFUL not to take multiple medicines containing Tylenol (also called acetaminophen). Doing so can lead to an overdose which can damage your liver and cause liver failure and possibly death.   Follow-up instructions: Please follow-up with your primary care provider in the next 3 days for further evaluation of your symptoms.   Return instructions:   Please return to the Emergency Department if you experience worsening symptoms.   Return if you have weakness in your arms or legs, slurred speech, trouble walking or talking, confusion, or trouble with your balance.   Please return if you have any other emergent concerns.  Additional Information:  Your vital signs today were: BP (!) 174/77    Pulse 77    Temp 98.2 F (36.8 C)    Resp 17    Ht 5\' 4"  (1.626 m)    Wt 64.4 kg    SpO2 99%    BMI 24.37 kg/m  If your blood pressure (BP) was elevated above 135/85 this visit, please have this repeated by your doctor within one month. --------------

## 2017-10-16 LAB — URINE CULTURE

## 2017-12-02 ENCOUNTER — Ambulatory Visit: Payer: Medicare Other | Admitting: Podiatry

## 2017-12-09 DIAGNOSIS — E1140 Type 2 diabetes mellitus with diabetic neuropathy, unspecified: Secondary | ICD-10-CM | POA: Diagnosis not present

## 2017-12-09 DIAGNOSIS — E119 Type 2 diabetes mellitus without complications: Secondary | ICD-10-CM | POA: Diagnosis not present

## 2017-12-09 DIAGNOSIS — I10 Essential (primary) hypertension: Secondary | ICD-10-CM | POA: Diagnosis not present

## 2017-12-09 DIAGNOSIS — E1165 Type 2 diabetes mellitus with hyperglycemia: Secondary | ICD-10-CM | POA: Diagnosis not present

## 2017-12-09 DIAGNOSIS — R531 Weakness: Secondary | ICD-10-CM | POA: Diagnosis not present

## 2017-12-09 DIAGNOSIS — E785 Hyperlipidemia, unspecified: Secondary | ICD-10-CM | POA: Diagnosis not present

## 2017-12-09 DIAGNOSIS — Z8673 Personal history of transient ischemic attack (TIA), and cerebral infarction without residual deficits: Secondary | ICD-10-CM | POA: Diagnosis not present

## 2017-12-09 DIAGNOSIS — N179 Acute kidney failure, unspecified: Secondary | ICD-10-CM | POA: Diagnosis not present

## 2017-12-09 DIAGNOSIS — R55 Syncope and collapse: Secondary | ICD-10-CM | POA: Diagnosis not present

## 2017-12-09 DIAGNOSIS — R278 Other lack of coordination: Secondary | ICD-10-CM | POA: Diagnosis not present

## 2017-12-09 DIAGNOSIS — R404 Transient alteration of awareness: Secondary | ICD-10-CM | POA: Diagnosis not present

## 2017-12-09 DIAGNOSIS — E039 Hypothyroidism, unspecified: Secondary | ICD-10-CM | POA: Diagnosis not present

## 2017-12-09 DIAGNOSIS — R4182 Altered mental status, unspecified: Secondary | ICD-10-CM | POA: Diagnosis not present

## 2017-12-09 DIAGNOSIS — I959 Hypotension, unspecified: Secondary | ICD-10-CM | POA: Diagnosis not present

## 2017-12-10 DIAGNOSIS — E119 Type 2 diabetes mellitus without complications: Secondary | ICD-10-CM | POA: Diagnosis not present

## 2017-12-10 DIAGNOSIS — Z8673 Personal history of transient ischemic attack (TIA), and cerebral infarction without residual deficits: Secondary | ICD-10-CM | POA: Diagnosis not present

## 2017-12-10 DIAGNOSIS — I348 Other nonrheumatic mitral valve disorders: Secondary | ICD-10-CM | POA: Diagnosis not present

## 2017-12-10 DIAGNOSIS — I10 Essential (primary) hypertension: Secondary | ICD-10-CM | POA: Diagnosis not present

## 2017-12-10 DIAGNOSIS — I5189 Other ill-defined heart diseases: Secondary | ICD-10-CM | POA: Diagnosis not present

## 2017-12-10 DIAGNOSIS — R531 Weakness: Secondary | ICD-10-CM | POA: Diagnosis not present

## 2017-12-10 DIAGNOSIS — R278 Other lack of coordination: Secondary | ICD-10-CM | POA: Diagnosis not present

## 2017-12-11 DIAGNOSIS — E039 Hypothyroidism, unspecified: Secondary | ICD-10-CM | POA: Diagnosis present

## 2017-12-11 DIAGNOSIS — E119 Type 2 diabetes mellitus without complications: Secondary | ICD-10-CM | POA: Diagnosis not present

## 2017-12-11 DIAGNOSIS — I10 Essential (primary) hypertension: Secondary | ICD-10-CM | POA: Diagnosis not present

## 2017-12-11 DIAGNOSIS — Z9111 Patient's noncompliance with dietary regimen: Secondary | ICD-10-CM | POA: Diagnosis not present

## 2017-12-11 DIAGNOSIS — E860 Dehydration: Secondary | ICD-10-CM | POA: Diagnosis present

## 2017-12-11 DIAGNOSIS — E1165 Type 2 diabetes mellitus with hyperglycemia: Secondary | ICD-10-CM | POA: Diagnosis present

## 2017-12-11 DIAGNOSIS — E1140 Type 2 diabetes mellitus with diabetic neuropathy, unspecified: Secondary | ICD-10-CM | POA: Diagnosis present

## 2017-12-11 DIAGNOSIS — Z9114 Patient's other noncompliance with medication regimen: Secondary | ICD-10-CM | POA: Diagnosis not present

## 2017-12-11 DIAGNOSIS — R278 Other lack of coordination: Secondary | ICD-10-CM | POA: Diagnosis not present

## 2017-12-11 DIAGNOSIS — Z79899 Other long term (current) drug therapy: Secondary | ICD-10-CM | POA: Diagnosis not present

## 2017-12-11 DIAGNOSIS — K219 Gastro-esophageal reflux disease without esophagitis: Secondary | ICD-10-CM | POA: Diagnosis present

## 2017-12-11 DIAGNOSIS — Z880 Allergy status to penicillin: Secondary | ICD-10-CM | POA: Diagnosis not present

## 2017-12-11 DIAGNOSIS — E785 Hyperlipidemia, unspecified: Secondary | ICD-10-CM | POA: Diagnosis not present

## 2017-12-11 DIAGNOSIS — R262 Difficulty in walking, not elsewhere classified: Secondary | ICD-10-CM | POA: Diagnosis not present

## 2017-12-11 DIAGNOSIS — E1169 Type 2 diabetes mellitus with other specified complication: Secondary | ICD-10-CM | POA: Diagnosis not present

## 2017-12-11 DIAGNOSIS — M6281 Muscle weakness (generalized): Secondary | ICD-10-CM | POA: Diagnosis not present

## 2017-12-11 DIAGNOSIS — Z8673 Personal history of transient ischemic attack (TIA), and cerebral infarction without residual deficits: Secondary | ICD-10-CM | POA: Diagnosis not present

## 2017-12-11 DIAGNOSIS — R41841 Cognitive communication deficit: Secondary | ICD-10-CM | POA: Diagnosis not present

## 2017-12-11 DIAGNOSIS — Z7902 Long term (current) use of antithrombotics/antiplatelets: Secondary | ICD-10-CM | POA: Diagnosis not present

## 2017-12-11 DIAGNOSIS — F0390 Unspecified dementia without behavioral disturbance: Secondary | ICD-10-CM | POA: Diagnosis not present

## 2017-12-11 DIAGNOSIS — I672 Cerebral atherosclerosis: Secondary | ICD-10-CM | POA: Diagnosis present

## 2017-12-11 DIAGNOSIS — F331 Major depressive disorder, recurrent, moderate: Secondary | ICD-10-CM | POA: Diagnosis not present

## 2017-12-11 DIAGNOSIS — Z794 Long term (current) use of insulin: Secondary | ICD-10-CM | POA: Diagnosis not present

## 2017-12-11 DIAGNOSIS — N179 Acute kidney failure, unspecified: Secondary | ICD-10-CM | POA: Diagnosis not present

## 2017-12-11 DIAGNOSIS — Z79890 Hormone replacement therapy (postmenopausal): Secondary | ICD-10-CM | POA: Diagnosis not present

## 2017-12-17 DIAGNOSIS — R278 Other lack of coordination: Secondary | ICD-10-CM | POA: Diagnosis not present

## 2017-12-17 DIAGNOSIS — Z8673 Personal history of transient ischemic attack (TIA), and cerebral infarction without residual deficits: Secondary | ICD-10-CM | POA: Diagnosis not present

## 2017-12-17 DIAGNOSIS — E785 Hyperlipidemia, unspecified: Secondary | ICD-10-CM | POA: Diagnosis not present

## 2017-12-17 DIAGNOSIS — N179 Acute kidney failure, unspecified: Secondary | ICD-10-CM | POA: Diagnosis not present

## 2017-12-17 DIAGNOSIS — E119 Type 2 diabetes mellitus without complications: Secondary | ICD-10-CM | POA: Diagnosis not present

## 2017-12-17 DIAGNOSIS — F331 Major depressive disorder, recurrent, moderate: Secondary | ICD-10-CM | POA: Diagnosis not present

## 2017-12-17 DIAGNOSIS — M6281 Muscle weakness (generalized): Secondary | ICD-10-CM | POA: Diagnosis not present

## 2017-12-17 DIAGNOSIS — R41841 Cognitive communication deficit: Secondary | ICD-10-CM | POA: Diagnosis not present

## 2017-12-17 DIAGNOSIS — E039 Hypothyroidism, unspecified: Secondary | ICD-10-CM | POA: Diagnosis not present

## 2017-12-17 DIAGNOSIS — E1165 Type 2 diabetes mellitus with hyperglycemia: Secondary | ICD-10-CM | POA: Diagnosis not present

## 2017-12-17 DIAGNOSIS — E1169 Type 2 diabetes mellitus with other specified complication: Secondary | ICD-10-CM | POA: Diagnosis not present

## 2017-12-17 DIAGNOSIS — I639 Cerebral infarction, unspecified: Secondary | ICD-10-CM | POA: Diagnosis not present

## 2017-12-17 DIAGNOSIS — E038 Other specified hypothyroidism: Secondary | ICD-10-CM | POA: Diagnosis not present

## 2017-12-17 DIAGNOSIS — K219 Gastro-esophageal reflux disease without esophagitis: Secondary | ICD-10-CM | POA: Diagnosis not present

## 2017-12-17 DIAGNOSIS — R260 Ataxic gait: Secondary | ICD-10-CM | POA: Diagnosis not present

## 2017-12-17 DIAGNOSIS — I10 Essential (primary) hypertension: Secondary | ICD-10-CM | POA: Diagnosis not present

## 2017-12-17 DIAGNOSIS — R262 Difficulty in walking, not elsewhere classified: Secondary | ICD-10-CM | POA: Diagnosis not present

## 2017-12-17 DIAGNOSIS — F0390 Unspecified dementia without behavioral disturbance: Secondary | ICD-10-CM | POA: Diagnosis not present

## 2017-12-17 DIAGNOSIS — F329 Major depressive disorder, single episode, unspecified: Secondary | ICD-10-CM | POA: Diagnosis not present

## 2017-12-20 DIAGNOSIS — M6281 Muscle weakness (generalized): Secondary | ICD-10-CM | POA: Diagnosis not present

## 2017-12-20 DIAGNOSIS — Z8673 Personal history of transient ischemic attack (TIA), and cerebral infarction without residual deficits: Secondary | ICD-10-CM | POA: Diagnosis not present

## 2017-12-20 DIAGNOSIS — E1165 Type 2 diabetes mellitus with hyperglycemia: Secondary | ICD-10-CM | POA: Diagnosis not present

## 2017-12-20 DIAGNOSIS — R260 Ataxic gait: Secondary | ICD-10-CM | POA: Diagnosis not present

## 2017-12-22 DIAGNOSIS — F329 Major depressive disorder, single episode, unspecified: Secondary | ICD-10-CM | POA: Diagnosis not present

## 2017-12-22 DIAGNOSIS — E039 Hypothyroidism, unspecified: Secondary | ICD-10-CM | POA: Diagnosis not present

## 2017-12-22 DIAGNOSIS — E119 Type 2 diabetes mellitus without complications: Secondary | ICD-10-CM | POA: Diagnosis not present

## 2017-12-22 DIAGNOSIS — I10 Essential (primary) hypertension: Secondary | ICD-10-CM | POA: Diagnosis not present

## 2017-12-22 DIAGNOSIS — N179 Acute kidney failure, unspecified: Secondary | ICD-10-CM | POA: Diagnosis not present

## 2017-12-22 DIAGNOSIS — I639 Cerebral infarction, unspecified: Secondary | ICD-10-CM | POA: Diagnosis not present

## 2017-12-22 DIAGNOSIS — E785 Hyperlipidemia, unspecified: Secondary | ICD-10-CM | POA: Diagnosis not present

## 2017-12-27 DIAGNOSIS — E038 Other specified hypothyroidism: Secondary | ICD-10-CM | POA: Diagnosis not present

## 2017-12-27 DIAGNOSIS — R260 Ataxic gait: Secondary | ICD-10-CM | POA: Diagnosis not present

## 2017-12-27 DIAGNOSIS — I10 Essential (primary) hypertension: Secondary | ICD-10-CM | POA: Diagnosis not present

## 2017-12-27 DIAGNOSIS — E1165 Type 2 diabetes mellitus with hyperglycemia: Secondary | ICD-10-CM | POA: Diagnosis not present

## 2017-12-28 ENCOUNTER — Other Ambulatory Visit: Payer: Self-pay | Admitting: *Deleted

## 2017-12-28 NOTE — Patient Outreach (Signed)
[facility]) Care Management  12/28/2017  Allison [name] 10/20/1934 [id number]  Referral from [facility] that patient is discharging and requesting this RNCM to assess from [facility] CM services.  Met with patient at bedside of facility.  Patient states she is going home tomorrow. She has a sister that lives with her and an adult son who is in and out of home.  She states she is worried about taking her medications, [facility] pharmacist has her linked to med packs and patient is still using them, she just "forgets" RNCM discussed other measures but patient reports she is "going to do better"  RNCM offered [facility] care management services. Patient reports she does not want to participate at this time. Patient accepted a packet for future reference. RNCM instructed patient to call main office line or RNCM if any new issues arise.  Will sign off and will collaborate with [facility] .  Allison [name]. Allison Purser, RN, BSN, [place] [phone]) Business Cell  [phone]) Toll Free Office

## 2018-01-04 ENCOUNTER — Ambulatory Visit (INDEPENDENT_AMBULATORY_CARE_PROVIDER_SITE_OTHER): Payer: Medicare Other | Admitting: Internal Medicine

## 2018-01-04 ENCOUNTER — Encounter: Payer: Self-pay | Admitting: Internal Medicine

## 2018-01-04 VITALS — BP 118/64 | HR 76 | Temp 97.9°F | Resp 10 | Ht 64.0 in | Wt 138.0 lb

## 2018-01-04 DIAGNOSIS — E1165 Type 2 diabetes mellitus with hyperglycemia: Secondary | ICD-10-CM

## 2018-01-04 DIAGNOSIS — IMO0002 Reserved for concepts with insufficient information to code with codable children: Secondary | ICD-10-CM

## 2018-01-04 DIAGNOSIS — E1129 Type 2 diabetes mellitus with other diabetic kidney complication: Secondary | ICD-10-CM

## 2018-01-04 DIAGNOSIS — Z8673 Personal history of transient ischemic attack (TIA), and cerebral infarction without residual deficits: Secondary | ICD-10-CM | POA: Diagnosis not present

## 2018-01-04 DIAGNOSIS — N189 Chronic kidney disease, unspecified: Secondary | ICD-10-CM | POA: Diagnosis not present

## 2018-01-04 DIAGNOSIS — R29818 Other symptoms and signs involving the nervous system: Secondary | ICD-10-CM | POA: Diagnosis not present

## 2018-01-04 DIAGNOSIS — R109 Unspecified abdominal pain: Secondary | ICD-10-CM | POA: Diagnosis not present

## 2018-01-04 DIAGNOSIS — R4189 Other symptoms and signs involving cognitive functions and awareness: Secondary | ICD-10-CM

## 2018-01-04 LAB — CBC WITH DIFFERENTIAL/PLATELET
ABSOLUTE MONOCYTES: 730 {cells}/uL (ref 200–950)
BASOS PCT: 0.8 %
Basophils Absolute: 61 cells/uL (ref 0–200)
EOS PCT: 2.2 %
Eosinophils Absolute: 167 cells/uL (ref 15–500)
HEMATOCRIT: 36.6 % (ref 35.0–45.0)
Hemoglobin: 11.9 g/dL (ref 11.7–15.5)
LYMPHS ABS: 2409 {cells}/uL (ref 850–3900)
MCH: 26.6 pg — ABNORMAL LOW (ref 27.0–33.0)
MCHC: 32.5 g/dL (ref 32.0–36.0)
MCV: 81.9 fL (ref 80.0–100.0)
MPV: 10.8 fL (ref 7.5–12.5)
Monocytes Relative: 9.6 %
NEUTROS ABS: 4233 {cells}/uL (ref 1500–7800)
Neutrophils Relative %: 55.7 %
Platelets: 291 10*3/uL (ref 140–400)
RBC: 4.47 10*6/uL (ref 3.80–5.10)
RDW: 13.7 % (ref 11.0–15.0)
Total Lymphocyte: 31.7 %
WBC: 7.6 10*3/uL (ref 3.8–10.8)

## 2018-01-04 LAB — BASIC METABOLIC PANEL WITH GFR
BUN: 8 mg/dL (ref 7–25)
CALCIUM: 9.7 mg/dL (ref 8.6–10.4)
CO2: 28 mmol/L (ref 20–32)
CREATININE: 0.88 mg/dL (ref 0.60–0.88)
Chloride: 106 mmol/L (ref 98–110)
GFR, Est African American: 71 mL/min/{1.73_m2} (ref >=60)
GFR, Est Non African American: 62 mL/min/{1.73_m2} (ref >=60)
Glucose, Bld: 128 mg/dL — ABNORMAL HIGH (ref 65–99)
Potassium: 4.3 mmol/L (ref 3.5–5.3)
Sodium: 144 mmol/L (ref 135–146)

## 2018-01-04 MED ORDER — ASSURE LANCE LANCETS 21G MISC: 1.0000 | Freq: Four times a day (QID) | 11 refills | Status: DC

## 2018-01-04 NOTE — Assessment & Plan Note (Addendum)
Hospitalized at [facility] in [place], [place] 11/30-12/06/2017 with generalized weakness and profound ataxia  MRI revealed severe intracranial acidosis but no acute process Continue Plavix

## 2018-01-04 NOTE — Progress Notes (Signed)
PCP: Sherrie Mustache, NP  This is a Chief Financial Officer office visit  follow up for follow-up post hospitalization and rehab stay.  Interim medical record and care since last [place] visit was updated with review of diagnostic studies and change in clinical status since last visit were documented.  HPI: She was at the [facility] in [place], [place] from 11/30-12/6 and subsequently at [facility] 12/6-12/18.  She presented with generalized weakness and ataxia.  She also had AKI which was felt to be prerenal.   MRI revealed severe intracranial atherosclerosis but no acute process.  She did have a history of stroke and was on Plavix.  She had uncontrolled diabetes as documented by hyperglycemia and A1c of 13.2%.  She admitted to drinking 22 ounce Pepsis on a regular basis every day and noncompliance with diabetic diet and her medications. She stated that with the noncompliance she was having profound urinary frequency and even frank incontinence. She received PT/OT at the SNF with some improvement in her strength and balance. The last creatinine in the medical records reviewed was 1.58.   Review of systems is positive for intermittent staggering gait.  She does use a 4 pronged cane.  She describes depression but is smiling as she does so.  Her neuropathy is unchanged despite taking gabapentin.  She has had some abdominal discomfort greater in the right lower quadrant than the left.  She states that her stomach "growls and bubbles" and then she must rush to the bathroom.  She will have some thick gelatinous type stool almost like tissue  followed by [facility]. PMH of 3 "diminuitive"adenomatous polyps in 2016. No recall due to age. The last CBC in the records from [place] revealed a hemoglobin 14.2 and hematocrit 42.   She has been checking her glucoses 4 times a day and the fasting has ranged from 89-274.  She denies any hypoglycemia.  She is on a basal insulin 18 units daily and a  sliding scale before meals.  Her son Merry Proud is helping her manage the insulin administration. Her son states that she does have some sundowning but generally is alert and oriented.Both parents had Alzheimer's. She states her driver's license was "taken away" & she wants to pursue getting it back.  Review of systems:  Constitutional: No fever, significant weight change, fatigue  Eyes: No redness, discharge, pain, vision change ENT/mouth: No nasal congestion,  purulent discharge, earache, change in hearing, sore throat  Cardiovascular: No chest pain, palpitations, paroxysmal nocturnal dyspnea, claudication, edema  Respiratory: No cough, sputum production, hemoptysis, DOE, significant snoring, apnea   Gastrointestinal: No heartburn, dysphagia, nausea /vomiting, rectal bleeding, melena Genitourinary: No dysuria, hematuria, pyuria, incontinence, nocturia Musculoskeletal: No joint stiffness, joint swelling, pain Dermatologic: No rash, pruritus, change in appearance of skin Neurologic: No dizziness, headache, syncope, seizures Endocrine: No change in hair/skin/nails, excessive thirst, excessive hunger, excessive urination  Hematologic/lymphatic: No significant bruising, lymphadenopathy, abnormal bleeding Allergy/immunology: No itchy/watery eyes, significant sneezing, urticaria, angioedema  Physical exam:  Pertinent or positive findings: She appears younger than her stated age.  Her gait is broad based on slightly unsteady.  She has complete dentures.  She has no significant tenderness to palpation over the lower quadrants.  Pedal pulses are decreased.  The first and second toenails of each foot are thickened and deformed. General appearance: Adequately nourished; no acute distress, increased work of breathing is present.   Lymphatic: No lymphadenopathy about the head, neck, axilla. Eyes: No conjunctival inflammation or lid edema is  present. There is no scleral icterus. Ears:  External ear exam shows  no significant lesions or deformities.   Nose:  External nasal examination shows no deformity or inflammation. Nasal mucosa are pink and moist without lesions, exudates Oral exam:  Lips and gums are healthy appearing. There is no oropharyngeal erythema or exudate. Neck:  No thyromegaly, masses, tenderness noted.    Heart:  Normal rate and regular rhythm. S1 and S2 normal without gallop, murmur, click, rub .  Lungs: Chest clear to auscultation without wheezes, rhonchi, rales, rubs. Abdomen: Bowel sounds are normal. Abdomen is soft and nontender with no organomegaly, hernias, masses. GU: Deferred  Extremities:  No cyanosis, clubbing, edema  Neurologic exam : Cn 2-7 intact Strength equal  in upper & lower extremities Balance, Rhomberg, finger to nose testing could not be completed due to clinical state Deep tendon reflexes are equal Skin: Warm & dry w/o tenting. No significant lesions or rash.  See summary under each active problem in the Problem List with associated updated therapeutic plan

## 2018-01-04 NOTE — Patient Instructions (Addendum)
Read all labels ; consume LESS than 40 grams of sugar / day from foods & drinks with  High Fructose Corn Syrup as #1, 2 , 3 or # 4 on label. (Note : dividing the "grams of sugar" on label by 4 gives "teaspoons of sugar" content of food or drink. For example a 22 oz Pepsi has 68 grams of sugar or 17 tsp of sugar).  Please take a probiotic , Florastor OR Align, every day if the bowels are loose. This will replace the normal bacteria which  are necessary for formation of normal stool and processing of food. If the gabapentin is not helping your neuropathy, consider decreasing it by 1 pill each day every 2 weeks.  I would eliminate the bedtime dose initially as this medication may affect your alertness and increase your risk of falls.  This risk is increased for you because of your gait issues and neuropathy. Your citalopram is low dose.  Consider increasing the citalopram to 20 mg daily until your next office visit.   Kidney function will be checked as metformin can impair kidney function.  Complete blood count will be checked because of your abdominal symptoms. Podiatry follow-up will be scheduled at your next office visit.  That appointment date will be determined based on the lab test results from today.  As your diabetes control improves, your risk of heart attack or stroke will decrease dramatically.  Once good diabetic control is verified, reapplying for your driver's license could be considered.

## 2018-01-05 ENCOUNTER — Encounter: Payer: Self-pay | Admitting: Internal Medicine

## 2018-01-05 NOTE — Assessment & Plan Note (Signed)
Alert & oriented X 3 @ appt 12/24

## 2018-01-05 NOTE — Assessment & Plan Note (Addendum)
Diabetic control improved due to dietary (off Pepsi) & med compliance Glucose monitor @ SNF BMET as on high dose Metformin

## 2018-01-07 ENCOUNTER — Encounter: Payer: Medicare Other | Admitting: Adult Health

## 2018-01-07 ENCOUNTER — Other Ambulatory Visit: Payer: Self-pay

## 2018-01-07 ENCOUNTER — Encounter: Payer: Self-pay | Admitting: Adult Health

## 2018-01-07 ENCOUNTER — Telehealth: Payer: Self-pay

## 2018-01-07 DIAGNOSIS — R109 Unspecified abdominal pain: Secondary | ICD-10-CM

## 2018-01-07 NOTE — Telephone Encounter (Signed)
Patient was scheduled to see Durenda Age, NP for medication concerns.   Patient and her son was in office to get clarification on medications. Patient picked up pill pack from [facility] today and the medications are not in sync with what patient is currently taking (medications were filled based on rx's sent by Dr.Carter). Patient was seen on Monday 01/03/18 by Dr.Hopper and I updated medication list based on discharge medications from Rehab center ([facility]). Medication list from [facility] is what patient is currently taking.    I faxed copy of current medication list to [facility], I then called the pharmacy and confirmed fax received. Per the pharmacist if patient brings pill pack back she will adjust based on medication list received.   Patient and son informed of encounter with  stated they have no additional needs or concerns. Patient plans to go to back to pharmacy when she leaves here. Appointment was canceled due to no need to see provider. Message handled to patient's apparent satisfaction.

## 2018-01-13 ENCOUNTER — Ambulatory Visit: Payer: Medicare Other | Admitting: Podiatry

## 2018-01-14 ENCOUNTER — Encounter: Payer: Self-pay | Admitting: Podiatry

## 2018-01-14 ENCOUNTER — Ambulatory Visit (INDEPENDENT_AMBULATORY_CARE_PROVIDER_SITE_OTHER): Payer: Medicare Other | Admitting: Podiatry

## 2018-01-14 DIAGNOSIS — E1142 Type 2 diabetes mellitus with diabetic polyneuropathy: Secondary | ICD-10-CM | POA: Diagnosis not present

## 2018-01-14 DIAGNOSIS — M79675 Pain in left toe(s): Secondary | ICD-10-CM

## 2018-01-14 DIAGNOSIS — M79674 Pain in right toe(s): Secondary | ICD-10-CM

## 2018-01-14 DIAGNOSIS — B351 Tinea unguium: Secondary | ICD-10-CM | POA: Diagnosis not present

## 2018-01-14 NOTE — Patient Instructions (Signed)
Diabetes Mellitus and Foot Care  Foot care is an important part of your health, especially when you have diabetes. Diabetes may cause you to have problems because of poor blood flow (circulation) to your feet and legs, which can cause your skin to:   Become thinner and drier.   Break more easily.   Heal more slowly.   Peel and crack.  You may also have nerve damage (neuropathy) in your legs and feet, causing decreased feeling in them. This means that you may not notice minor injuries to your feet that could lead to more serious problems. Noticing and addressing any potential problems early is the best way to prevent future foot problems.  How to care for your feet  Foot hygiene   Wash your feet daily with warm water and mild soap. Do not use hot water. Then, pat your feet and the areas between your toes until they are completely dry. Do not soak your feet as this can dry your skin.   Trim your toenails straight across. Do not dig under them or around the cuticle. File the edges of your nails with an emery board or nail file.   Apply a moisturizing lotion or petroleum jelly to the skin on your feet and to dry, brittle toenails. Use lotion that does not contain alcohol and is unscented. Do not apply lotion between your toes.  Shoes and socks   Wear clean socks or stockings every day. Make sure they are not too tight. Do not wear knee-high stockings since they may decrease blood flow to your legs.   Wear shoes that fit properly and have enough cushioning. Always look in your shoes before you put them on to be sure there are no objects inside.   To break in new shoes, wear them for just a few hours a day. This prevents injuries on your feet.  Wounds, scrapes, corns, and calluses   Check your feet daily for blisters, cuts, bruises, sores, and redness. If you cannot see the bottom of your feet, use a mirror or ask someone for help.   Do not cut corns or calluses or try to remove them with medicine.   If you  find a minor scrape, cut, or break in the skin on your feet, keep it and the skin around it clean and dry. You may clean these areas with mild soap and water. Do not clean the area with peroxide, alcohol, or iodine.   If you have a wound, scrape, corn, or callus on your foot, look at it several times a day to make sure it is healing and not infected. Check for:  ? Redness, swelling, or pain.  ? Fluid or blood.  ? Warmth.  ? Pus or a bad smell.  General instructions   Do not cross your legs. This may decrease blood flow to your feet.   Do not use heating pads or hot water bottles on your feet. They may burn your skin. If you have lost feeling in your feet or legs, you may not know this is happening until it is too late.   Protect your feet from hot and cold by wearing shoes, such as at the beach or on hot pavement.   Schedule a complete foot exam at least once a year (annually) or more often if you have foot problems. If you have foot problems, report any cuts, sores, or bruises to your health care provider immediately.  Contact a health care provider if:     You have a medical condition that increases your risk of infection and you have any cuts, sores, or bruises on your feet.   You have an injury that is not healing.   You have redness on your legs or feet.   You feel burning or tingling in your legs or feet.   You have pain or cramps in your legs and feet.   Your legs or feet are numb.   Your feet always feel cold.   You have pain around a toenail.  Get help right away if:   You have a wound, scrape, corn, or callus on your foot and:  ? You have pain, swelling, or redness that gets worse.  ? You have fluid or blood coming from the wound, scrape, corn, or callus.  ? Your wound, scrape, corn, or callus feels warm to the touch.  ? You have pus or a bad smell coming from the wound, scrape, corn, or callus.  ? You have a fever.  ? You have a red line going up your leg.  Summary   Check your feet every day  for cuts, sores, red spots, swelling, and blisters.   Moisturize feet and legs daily.   Wear shoes that fit properly and have enough cushioning.   If you have foot problems, report any cuts, sores, or bruises to your health care provider immediately.   Schedule a complete foot exam at least once a year (annually) or more often if you have foot problems.  This information is not intended to replace advice given to you by your health care provider. Make sure you discuss any questions you have with your health care provider.  Document Released: 12/27/1999 Document Revised: 02/10/2017 Document Reviewed: 01/31/2016  Elsevier Interactive Patient Education  2019 Elsevier Inc[employer]

## 2018-01-20 NOTE — Progress Notes (Signed)
This encounter was created in error - please disregard.

## 2018-01-21 ENCOUNTER — Other Ambulatory Visit: Payer: Self-pay | Admitting: *Deleted

## 2018-01-21 MED ORDER — INSULIN DETEMIR 100 UNIT/ML ~~LOC~~ SOLN: 16.0000 [IU] | Freq: Every day | SUBCUTANEOUS | 1 refills | Status: DC

## 2018-01-21 NOTE — Telephone Encounter (Signed)
Patient son called and stated that patient needed a refill on her insulin sent to the pharmacy.   Pended Rx and sent to [facility] for Approval due to [place].   Called and spoke with Janett Billow and Verbal approval to send in Rx. Received.  Faxed.

## 2018-02-04 ENCOUNTER — Other Ambulatory Visit: Payer: Medicare Other

## 2018-02-04 DIAGNOSIS — R109 Unspecified abdominal pain: Secondary | ICD-10-CM

## 2018-02-04 LAB — CBC WITH DIFFERENTIAL/PLATELET
ABSOLUTE MONOCYTES: 647 {cells}/uL (ref 200–950)
BASOS PCT: 1 %
Basophils Absolute: 49 cells/uL (ref 0–200)
Eosinophils Absolute: 127 cells/uL (ref 15–500)
Eosinophils Relative: 2.6 %
HCT: 37.1 % (ref 35.0–45.0)
Hemoglobin: 11.9 g/dL (ref 11.7–15.5)
Lymphs Abs: 1318 cells/uL (ref 850–3900)
MCH: 26.8 pg — ABNORMAL LOW (ref 27.0–33.0)
MCHC: 32.1 g/dL (ref 32.0–36.0)
MCV: 83.6 fL (ref 80.0–100.0)
MPV: 11.4 fL (ref 7.5–12.5)
Monocytes Relative: 13.2 %
NEUTROS ABS: 2759 {cells}/uL (ref 1500–7800)
Neutrophils Relative %: 56.3 %
PLATELETS: 218 10*3/uL (ref 140–400)
RBC: 4.44 10*6/uL (ref 3.80–5.10)
RDW: 12.5 % (ref 11.0–15.0)
TOTAL LYMPHOCYTE: 26.9 %
WBC: 4.9 10*3/uL (ref 3.8–10.8)

## 2018-02-06 NOTE — Progress Notes (Signed)
Subjective: Allison [name] presents today referred by Lauree Chandler, NP with cc of painful, discolored, thick toenails which interfere with daily activities.  Pain is aggravated when wearing enclosed shoe gear. Patient has seen Dr. Caffie Pinto in the past and is looking to establish routine foot care.  Past Medical History:  Diagnosis Date  . Carpal tunnel syndrome   . Diabetes mellitus without complication ([place])   . Dysphagia   . GERD (gastroesophageal reflux disease)   . Hiatal hernia   . HLD (hyperlipidemia)   . Hx of adenomatous colonic polyps 06/19/2014  . Hypertension   . Hypothyroidism   . Onychomycosis 03/05/2012  . Pain in lower limb 04/18/2013  . Sciatic nerve pain   . Vertigo      Patient Active Problem List   Diagnosis Date Noted  . Neurocognitive deficits 01/04/2018  . History of stroke 10/01/2016  . Anxiety state 06/03/2016  . ARI (acute respiratory infection) 01/22/2016  . Uncontrolled type 2 diabetes mellitus with hyperglycemia, with long-term current use of insulin ([place]) 10/29/2015  . Acute ischemic stroke ([place]) 10/29/2015  . Hallucinations   . Visual hallucinations 10/28/2015  . Stroke-like symptoms 10/28/2015  . CVA (cerebral vascular accident) ([place]) 10/28/2015  . Ischemic stroke ([place])   . Diabetes mellitus with complication ([place])   . Hx of adenomatous colonic polyps 06/19/2014  . Neuropathic pain 03/27/2014  . Uncontrolled type 2 diabetes with neuropathy ([place]) 03/27/2014  . Bilateral carpal tunnel syndrome 03/27/2014  . AKI (acute kidney injury) ([place]) 08/05/2013  . DM type 2, uncontrolled, with renal complications ([place]) 36/64/4034  . Hypercalcemia 08/05/2013  . PVD (peripheral vascular disease) ([place]) 03/05/2012  . Onychomycosis 03/05/2012  . Other hammer toe (acquired) 03/05/2012  . Abnormal CXR 12/25/2011  . Cystocele 06/11/2011  . Incontinence 06/04/2011  . Depression 06/04/2011  . Hypothyroidism 06/23/2006  . Dyslipidemia 06/23/2006  . Essential  hypertension 06/23/2006  . GERD 06/23/2006  . IBS 06/23/2006     Past Surgical History:  Procedure Laterality Date  . BLADDER SURGERY     Mesh implant  . COLONOSCOPY    . LARYNX SURGERY     vocal cords  . PARTIAL HYSTERECTOMY  1978  . VESICOVAGINAL FISTULA CLOSURE W/ TAH  1976      Current Outpatient Medications:  .  amLODipine (NORVASC) 5 MG tablet, Take 5 mg by mouth daily., Disp: , Rfl:  .  ASSURE LANCE LANCETS 21G MISC, 1 each by Does not apply route 4 (four) times daily. DX E11.65, Disp: 200 each, Rfl: 11 .  atorvastatin (LIPITOR) 80 MG tablet, Take 80 mg by mouth daily., Disp: , Rfl:  .  Blood Glucose Monitoring Suppl (ONETOUCH VERIO) w/Device KIT, 1 each by Does not apply route 3 (three) times daily. Use to test blood sugar three times daily. Dx: E11.21, Disp: 1 kit, Rfl: 0 .  citalopram (CELEXA) 10 MG tablet, Take 1 tablet (10 mg total) by mouth daily For anxiety in the morning, Disp: 30 tablet, Rfl: 5 .  clopidogrel (PLAVIX) 75 MG tablet, Take 1 tablet (75 mg total) by mouth daily in the morning for history of stroke, Disp: 30 tablet, Rfl: 5 .  donepezil (ARICEPT) 10 MG tablet, Take 1 tablet (10 mg total) by mouth at bedtime. For memory, Disp: 30 tablet, Rfl: 6 .  gabapentin (NEURONTIN) 100 MG capsule, Take 100 mg by mouth 4 (four) times daily., Disp: , Rfl:  .  glucose blood (ONETOUCH VERIO) test strip, Check blood sugar three  times daily DX E11.21 (One Touch Verio IQ), Disp: 200 each, Rfl: 11 .  insulin aspart protamine- aspart (NOVOLOG MIX 70/30) (70-30) 100 UNIT/ML injection, Inject into the skin. 100-150= 1 unit, 151-200= 2 units, 201-250= 3 units, 251-300= 5 units, 301-350= 7 units, greater than 350= 9 units, Disp: , Rfl:  .  Insulin Pen Needle (PEN NEEDLES) 31G X 5 MM MISC, 1 each by Does not apply route as needed. Use when giving insulin injections. Dx: E11.9, Disp: 200 each, Rfl: 3 .  levothyroxine (SYNTHROID, LEVOTHROID) 150 MCG tablet, take 1 tablet by mouth every  morning ON AN EMPTY STOMACH for THROID in the morning, Disp: 30 tablet, Rfl: 5 .  magnesium oxide (MAG-OX) 400 MG tablet, Take 400 mg by mouth daily., Disp: , Rfl:  .  metFORMIN (GLUCOPHAGE) 1000 MG tablet, Take 1,000 mg by mouth 2 (two) times daily with a meal., Disp: , Rfl:  .  omeprazole (PRILOSEC) 20 MG capsule, Take 20 mg by mouth daily., Disp: , Rfl:  .  ondansetron (ZOFRAN) 4 MG tablet, Take 4 mg by mouth every 6 (six) hours as needed for nausea or vomiting., Disp: , Rfl:  .  insulin detemir (LEVEMIR) 100 UNIT/ML injection, Inject 0.16 mLs (16 Units total) into the skin daily., Disp: 10 mL, Rfl: 1   Allergies  Allergen Reactions  . Lantus [Insulin Glargine] Itching and Swelling    angioedema  . Levemir [Insulin Detemir] Itching and Swelling    angioedema  . Pineapple Anaphylaxis  . Codeine Other (See Comments)    unknown  . Invokana [Canagliflozin] Other (See Comments)    Constipation, nightmares  . Penicillins Other (See Comments)    Unknown   . Tradjenta [Linagliptin] Other (See Comments)    Unknown  . Vioxx [Rofecoxib] Other (See Comments)    unknown     Social History   Occupational History  . Occupation: Retired  Tobacco Use  . Smoking status: Never Smoker  . Smokeless tobacco: Never Used  Substance and Sexual Activity  . Alcohol use: No    Alcohol/week: 0.0 standard drinks  . Drug use: No  . Sexual activity: Never    Birth control/protection: Abstinence     Family History  Problem Relation Age of Onset  . Alzheimer's disease Mother   . Glaucoma Mother   . Heart disease Father   . Alzheimer's disease Father   . Asthma Sister   . Allergies Sister   . Allergies Sister   . Sleep apnea Sister   . Heart disease Sister   . Breast cancer Maternal Aunt   . Pancreatic cancer Maternal Aunt   . Prostate cancer Maternal Uncle   . Prostate cancer Paternal Uncle   . Alcoholism Maternal Uncle   . Kidney disease Maternal Aunt   . Heart disease Maternal Aunt       Immunization History  Administered Date(s) Administered  . Influenza Whole 09/13/2011  . Influenza, High Dose Seasonal PF 10/11/2017  . Influenza,inj,Quad PF,6+ Mos 12/05/2013, 12/31/2014, 11/15/2015, 02/12/2017  . Pneumococcal Conjugate-13 03/27/2014  . Pneumococcal Polysaccharide-23 04/28/2016  . Td 06/13/2003     Review of systems: Positive Findings in bold print.  Constitutional:  chills, fatigue, fever, sweats, weight change Communication: Optometrist, sign Ecologist, hand writing, iPad/Android device Head: headaches, head injury Eyes: changes in vision, eye pain, glaucoma, cataracts, macular degeneration, diplopia, glare,  light sensitivity, eyeglasses or contacts, blindness Ears nose mouth throat: Hard of hearing, ringing in ears, deaf, sign language,  vertigo,  nosebleeds,  rhinitis,  cold sores, snoring, swollen glands Cardiovascular: HTN, edema, arrhythmia, pacemaker in place, defibrillator in place,  chest pain/tightness, chronic anticoagulation, blood clot, heart failure Peripheral Vascular: leg cramps, varicose veins, blood clots, lymphedema Respiratory:  difficulty breathing, denies congestion, SOB, wheezing, cough, emphysema Gastrointestinal: change in appetite or weight, abdominal pain, constipation, diarrhea, nausea, vomiting, vomiting blood, change in bowel habits, abdominal pain, jaundice, rectal bleeding, hemorrhoids, Genitourinary:  nocturia,  pain on urination,  blood in urine, Foley catheter, urinary urgency Musculoskeletal: uses mobility aid,  cramping, stiff joints, painful joints, decreased joint motion, fractures, OA, gout Skin: +changes in toenails, color change, dryness, itching, mole changes,  rash  Neurological: headaches, numbness in feet, paresthesias in feet, burning in feet, fainting,  seizures, change in speech. denies headaches, CVA,  memory problems/poor historian, cerebral palsy, weakness, paralysis Endocrine: diabetes, hypothyroidism,  hyperthyroidism,  goiter, dry mouth, flushing, heat intolerance,  cold intolerance,  excessive thirst, denies polyuria,  nocturia Hematological:  easy bleeding, excessive bleeding, easy bruising, enlarged lymph nodes, on long term blood thinner, history of past transusions Allergy/immunological:  hives, eczema, frequent infections, multiple drug allergies, seasonal allergies, transplant recipient Psychiatric:  anxiety, depression, mood disorder, suicidal ideations, hallucinations   Objective: Vascular Examination: Capillary refill time immediate x 10 digits Dorsalis pedis and posterior tibial pulses present b/l No digital hair x 10 digits Skin temperature gradient WNL b/l  Dermatological Examination: Skin with normal turgor, texture and tone b/l  Toenails 1-5 b/l discolored, thick, dystrophic with subungual debris and pain with palpation to nailbeds due to thickness of nails.  Musculoskeletal: Muscle strength 5/5 to all LE muscle groups  Neurological: Sensation intact with 10 gram monofilament Vibratory sensation diminished b/l  Assessment: 1. Painful onychomycosis toenails 1-5 b/l  2. NIDDM with neuropathy   Plan: 1. Discussed onychomycosis and treatment options.  Literature dispensed on today. 2. Toenails 1-5 b/l were debrided in length and girth without iatrogenic bleeding. 3. Patient to continue soft, supportive shoe gear 4. Patient to report any pedal injuries to medical professional immediately. 5. Follow up 3 months. Patient/POA to call should there be a concern in the interim.

## 2018-02-10 ENCOUNTER — Ambulatory Visit (INDEPENDENT_AMBULATORY_CARE_PROVIDER_SITE_OTHER): Payer: Medicare Other | Admitting: Nurse Practitioner

## 2018-02-10 ENCOUNTER — Telehealth: Payer: Self-pay | Admitting: *Deleted

## 2018-02-10 ENCOUNTER — Encounter: Payer: Self-pay | Admitting: Nurse Practitioner

## 2018-02-10 VITALS — BP 142/78 | HR 68 | Temp 97.4°F | Ht 64.0 in | Wt 131.6 lb

## 2018-02-10 DIAGNOSIS — E1129 Type 2 diabetes mellitus with other diabetic kidney complication: Secondary | ICD-10-CM

## 2018-02-10 DIAGNOSIS — F0150 Vascular dementia without behavioral disturbance: Secondary | ICD-10-CM | POA: Diagnosis not present

## 2018-02-10 DIAGNOSIS — N189 Chronic kidney disease, unspecified: Secondary | ICD-10-CM | POA: Diagnosis not present

## 2018-02-10 DIAGNOSIS — I10 Essential (primary) hypertension: Secondary | ICD-10-CM

## 2018-02-10 DIAGNOSIS — M792 Neuralgia and neuritis, unspecified: Secondary | ICD-10-CM | POA: Diagnosis not present

## 2018-02-10 DIAGNOSIS — E785 Hyperlipidemia, unspecified: Secondary | ICD-10-CM

## 2018-02-10 DIAGNOSIS — Z8673 Personal history of transient ischemic attack (TIA), and cerebral infarction without residual deficits: Secondary | ICD-10-CM

## 2018-02-10 DIAGNOSIS — E1165 Type 2 diabetes mellitus with hyperglycemia: Secondary | ICD-10-CM | POA: Diagnosis not present

## 2018-02-10 DIAGNOSIS — F411 Generalized anxiety disorder: Secondary | ICD-10-CM

## 2018-02-10 DIAGNOSIS — IMO0002 Reserved for concepts with insufficient information to code with codable children: Secondary | ICD-10-CM

## 2018-02-10 DIAGNOSIS — E034 Atrophy of thyroid (acquired): Secondary | ICD-10-CM

## 2018-02-10 MED ORDER — INSULIN DETEMIR 100 UNIT/ML ~~LOC~~ SOLN: 16.0000 [IU] | Freq: Every day | SUBCUTANEOUS | 1 refills | Status: DC

## 2018-02-10 NOTE — Progress Notes (Signed)
Careteam: Patient Care Team: Lauree Chandler, NP as PCP - General (Geriatric Medicine)  Advanced Directive information Does Patient Have a Medical Advance Directive?: No, Would patient like information on creating a medical advance directive?: Yes (Inpatient - patient requests chaplain consult to create a medical advance directive)  Allergies  Allergen Reactions  . Lantus [Insulin Glargine] Itching and Swelling    angioedema  . Levemir [Insulin Detemir] Itching and Swelling    angioedema  . Pineapple Anaphylaxis  . Codeine Other (See Comments)    unknown  . Invokana [Canagliflozin] Other (See Comments)    Constipation, nightmares  . Penicillins Other (See Comments)    Unknown   . Tradjenta [Linagliptin] Other (See Comments)    Unknown  . Vioxx [Rofecoxib] Other (See Comments)    unknown    Chief Complaint  Patient presents with  . Medical Management of Chronic Issues    3 month follow up.due for tdap vaccine patietn refused     HPI: Patient is a 84 y.o. female seen in the office today for routine follow up.  She was hospitalized in [place], son reports he does not know details. Pt reports she had a stroke. Hospitalized at [facility] in [place], [place] 11/30-12/06/2017 with generalized weakness and profound ataxia.  MRI revealed severe intracranial acidosis but no acute process Son states medications have been totally changed since she was hospitalized. She states she does not recognize most of the medications on our list. Unsure what she is taking.  Was getting packs from the pharmacy but now this has changed.   Dementia- progressive decline. Living with her sister who does not help her with her medication. Here with her son and states that her youngest was supposed to be helping but he has not.    TSH- TSH was elevated at 9.20 but was not taking her medication routinely. Synthroid 150 mcg prescribed. Son states she is not taking.    DM- reports she  is now taking levemir 16 units every morning for diabetes but has been out the last 2 days. Taking gabapentin for diabetic neuropathy and losartan for renal protection.   Hx of stroke- has been on plavix.   Hyperlipidemia-  Taking lipitor but "skips at times"  last LDL 197 but fasting today.   Anxiety- reports "nerves" are bad, does not feel like she is taking celexa.   Review of Systems:  Review of Systems  Unable to perform ROS: Dementia    Past Medical History:  Diagnosis Date  . Carpal tunnel syndrome   . Diabetes mellitus without complication ([place])   . Dysphagia   . GERD (gastroesophageal reflux disease)   . Hiatal hernia   . HLD (hyperlipidemia)   . Hx of adenomatous colonic polyps 06/19/2014  . Hypertension   . Hypothyroidism   . Onychomycosis 03/05/2012  . Pain in lower limb 04/18/2013  . Sciatic nerve pain   . Vertigo    Past Surgical History:  Procedure Laterality Date  . BLADDER SURGERY     Mesh implant  . COLONOSCOPY    . LARYNX SURGERY     vocal cords  . PARTIAL HYSTERECTOMY  1978  . VESICOVAGINAL FISTULA CLOSURE W/ TAH  1976   Social History:   reports that she has never smoked. She has never used smokeless tobacco. She reports that she does not drink alcohol or use drugs.  Family History  Problem Relation Age of Onset  . Alzheimer's disease Mother   .  Glaucoma Mother   . Heart disease Father   . Alzheimer's disease Father   . Asthma Sister   . Allergies Sister   . Allergies Sister   . Sleep apnea Sister   . Heart disease Sister   . Breast cancer Maternal Aunt   . Pancreatic cancer Maternal Aunt   . Prostate cancer Maternal Uncle   . Prostate cancer Paternal Uncle   . Alcoholism Maternal Uncle   . Kidney disease Maternal Aunt   . Heart disease Maternal Aunt     Medications: Patient's Medications  New Prescriptions   No medications on file  Previous Medications   AMLODIPINE (NORVASC) 5 MG TABLET    Take 5 mg by mouth daily.   ASSURE LANCE  LANCETS 21G MISC    1 each by Does not apply route 4 (four) times daily. DX E11.65   ATORVASTATIN (LIPITOR) 80 MG TABLET    Take 80 mg by mouth daily.   BLOOD GLUCOSE MONITORING SUPPL (ONETOUCH VERIO) W/DEVICE KIT    1 each by Does not apply route 3 (three) times daily. Use to test blood sugar three times daily. Dx: E11.21   CITALOPRAM (CELEXA) 10 MG TABLET    Take 1 tablet (10 mg total) by mouth daily For anxiety in the morning   CLOPIDOGREL (PLAVIX) 75 MG TABLET    Take 1 tablet (75 mg total) by mouth daily in the morning for history of stroke   DONEPEZIL (ARICEPT) 10 MG TABLET    Take 1 tablet (10 mg total) by mouth at bedtime. For memory   GABAPENTIN (NEURONTIN) 100 MG CAPSULE    Take 100 mg by mouth 4 (four) times daily.   GLUCOSE BLOOD (ONETOUCH VERIO) TEST STRIP    Check blood sugar three times daily DX E11.21 (One Touch Verio IQ)   INSULIN ASPART PROTAMINE- ASPART (NOVOLOG MIX 70/30) (70-30) 100 UNIT/ML INJECTION    Inject into the skin. 100-150= 1 unit, 151-200= 2 units, 201-250= 3 units, 251-300= 5 units, 301-350= 7 units, greater than 350= 9 units   INSULIN DETEMIR (LEVEMIR) 100 UNIT/ML INJECTION    Inject 0.16 mLs (16 Units total) into the skin daily.   INSULIN PEN NEEDLE (PEN NEEDLES) 31G X 5 MM MISC    1 each by Does not apply route as needed. Use when giving insulin injections. Dx: E11.9   LEVOTHYROXINE (SYNTHROID, LEVOTHROID) 150 MCG TABLET    take 1 tablet by mouth every morning ON AN EMPTY STOMACH for THROID in the morning   MAGNESIUM OXIDE (MAG-OX) 400 MG TABLET    Take 400 mg by mouth daily.   METFORMIN (GLUCOPHAGE) 1000 MG TABLET    Take 1,000 mg by mouth 2 (two) times daily with a meal.   OMEPRAZOLE (PRILOSEC) 20 MG CAPSULE    Take 20 mg by mouth daily.   ONDANSETRON (ZOFRAN) 4 MG TABLET    Take 4 mg by mouth every 6 (six) hours as needed for nausea or vomiting.  Modified Medications   No medications on file  Discontinued Medications   No medications on file     Physical  Exam:  Vitals:   02/10/18 1026  BP: (!) 142/78  Pulse: 68  Temp: (!) 97.4 F (36.3 C)  TempSrc: Oral  SpO2: 99%  Weight: 131 lb 9.6 oz (59.7 kg)  Height: '5\' 4"'  (1.626 m)   Body mass index is 22.59 kg/m.  Physical Exam Constitutional:      Appearance: She is well-developed.  Comments: Frail appearing in NAD  HENT:     Head: Normocephalic and atraumatic.  Neck:     Musculoskeletal: Normal range of motion.  Cardiovascular:     Rate and Rhythm: Normal rate and regular rhythm.     Heart sounds: Murmur (1/6 SEM) present. No friction rub. No gallop.      Comments: No LE edema b/l; no calf TTP Pulmonary:     Effort: Pulmonary effort is normal. No respiratory distress.     Breath sounds: Normal breath sounds. No stridor. No wheezing or rales.  Abdominal:     General: Bowel sounds are normal.     Palpations: Abdomen is soft.  Skin:    General: Skin is warm and dry.     Findings: No rash.  Neurological:     Mental Status: She is alert and oriented to person, place, and time. Mental status is at baseline.     Gait: Gait abnormal.     Comments: Oriented but very poor historian. Repeats herself often during OV, son corrects her history  Psychiatric:        Mood and Affect: Mood normal.     Labs reviewed: Basic Metabolic Panel: Recent Labs    02/12/17 1059 05/26/17 1233  07/30/17 1025 10/11/17 1357 10/15/17 0710 10/15/17 0725 01/04/18 1201  NA 141 140   < >  --  137 138 138 144  K 3.5 3.8   < >  --  4.0 3.8 3.9 4.3  CL 106 101   < >  --  98 97* 100 106  CO2 30 31   < >  --  29 26  --  28  GLUCOSE 216* 324*   < >  --  385* 363* 374* 128*  BUN 7 9   < >  --  '9 9 11 8  ' CREATININE 0.74 0.85   < >  --  0.89* 1.01* 0.90 0.88  CALCIUM 9.2 9.7   < >  --  9.6 10.3  --  9.7  TSH 12.94* 11.17*  --  9.20*  --   --   --   --    < > = values in this interval not displayed.   Liver Function Tests: Recent Labs    07/18/17 1317 10/11/17 1357 10/15/17 0710  AST '15 12 16    ' ALT '14 11 14  ' ALKPHOS 58  --  68  BILITOT 1.0 0.7 1.1  PROT 6.9 6.7 7.1  ALBUMIN 3.9  --  4.0   No results for input(s): LIPASE, AMYLASE in the last 8760 hours. No results for input(s): AMMONIA in the last 8760 hours. CBC: Recent Labs    10/15/17 0710 10/15/17 0725 01/04/18 1201 02/04/18 1135  WBC 7.4  --  7.6 4.9  NEUTROABS 4.4  --  4,233 2,759  HGB 14.1 15.0 11.9 11.9  HCT 43.4 44.0 36.6 37.1  MCV 83.8  --  81.9 83.6  PLT 240  --  291 218   Lipid Panel: Recent Labs    05/26/17 1233  CHOL 276*  HDL 56  LDLCALC 197*  TRIG 103  CHOLHDL 4.9   TSH: Recent Labs    02/12/17 1059 05/26/17 1233 07/30/17 1025  TSH 12.94* 11.17* 9.20*   A1C: Lab Results  Component Value Date   HGBA1C 13.1 (H) 05/26/2017     Assessment/Plan 1. DM type 2, uncontrolled, with renal complications ([place]) -out of levemir, unsure what blood sugar readings are. Will follow up A1c  today. Son is to call and confirm the mealtime insulin she is taking.  - insulin detemir (LEVEMIR) 100 UNIT/ML injection; Inject 0.16 mLs (16 Units total) into the skin daily.  Dispense: 10 mL; Refill: 1 - Hemoglobin A1c  2. Chronic kidney disease, unspecified CKD stage Will follow up lab today. Encourage proper hydration and to avoid NSAIDS (Aleve, Advil, Motrin, Ibuprofen)   3. History of stroke Discussed compliance with medication, proper blood pressure, blood sugar and lipid control for prevention   4. Vascular dementia without behavioral disturbance (HCC) Stable at this time. Here with son who plans to help out more with medication and follow up  5. Hyperlipidemia LDL goal <100 -continues on statin will follow up - COMPLETE METABOLIC PANEL WITH GFR - Lipid Panel  6. Hypothyroidism due to acquired atrophy of thyroid -taking synthroid 137mg, will followup lab - TSH  7. Anxiety state Unsure if she is taking her celexa.   8. Neuropathic pain Stable, continue on gabapentin.  9. Essential  hypertension Repeat bp unchanged, son to make sure she took her medication this morning.  Next appt: 2 weeks to bring all medication bottles to visit to confirm what she is taking. Will request discharge summary from SNF as well.  JCarlos American [facility] [phone]

## 2018-02-10 NOTE — Telephone Encounter (Signed)
Allison [name], son called and stated that he cannot find the medication bottle stating the increase in Amlodipine. Stated that he will bring all patient's medication bottles to next visit so it all can be straightened out. FYI

## 2018-02-10 NOTE — Patient Instructions (Addendum)
Please review our medication list which what you are taking at home. Call us and let us know if there are difference.   Will get labs today  Follow up in 2 weeks- bring medication bottles

## 2018-02-10 NOTE — Telephone Encounter (Signed)
Thank you :)

## 2018-02-11 LAB — COMPLETE METABOLIC PANEL WITH GFR
AG Ratio: 1.5 (calc) (ref 1.0–2.5)
ALT: 11 U/L (ref 6–29)
AST: 11 U/L (ref 10–35)
Albumin: 3.8 g/dL (ref 3.6–5.1)
Alkaline phosphatase (APISO): 62 U/L (ref 33–130)
BUN: 7 mg/dL (ref 7–25)
CHLORIDE: 104 mmol/L (ref 98–110)
CO2: 28 mmol/L (ref 20–32)
Calcium: 9.9 mg/dL (ref 8.6–10.4)
Creat: 0.73 mg/dL (ref 0.60–0.88)
GFR, Est African American: 90 mL/min/{1.73_m2} (ref >=60)
GFR, Est Non African American: 77 mL/min/{1.73_m2} (ref >=60)
GLUCOSE: 245 mg/dL — AB (ref 65–99)
Globulin: 2.5 g/dL (calc) (ref 1.9–3.7)
Potassium: 3.9 mmol/L (ref 3.5–5.3)
Sodium: 142 mmol/L (ref 135–146)
Total Bilirubin: 0.6 mg/dL (ref 0.2–1.2)
Total Protein: 6.3 g/dL (ref 6.1–8.1)

## 2018-02-11 LAB — HEMOGLOBIN A1C
Hgb A1c MFr Bld: 9.5 % of total Hgb — ABNORMAL HIGH (ref <5.7)
Mean Plasma Glucose: 226 (calc)
eAG (mmol/L): 12.5 (calc)

## 2018-02-11 LAB — LIPID PANEL
CHOL/HDL RATIO: 5.1 (calc) — AB (ref <5.0)
Cholesterol: 234 mg/dL — ABNORMAL HIGH (ref <200)
HDL: 46 mg/dL — ABNORMAL LOW (ref >50)
LDL CHOLESTEROL (CALC): 168 mg/dL — AB
Non-HDL Cholesterol (Calc): 188 mg/dL (calc) — ABNORMAL HIGH (ref <130)
Triglycerides: 96 mg/dL (ref <150)

## 2018-02-11 LAB — TSH: TSH: 0.02 m[IU]/L — AB (ref 0.40–4.50)

## 2018-02-24 ENCOUNTER — Ambulatory Visit: Payer: Medicare Other | Admitting: Nurse Practitioner

## 2018-02-25 ENCOUNTER — Ambulatory Visit (INDEPENDENT_AMBULATORY_CARE_PROVIDER_SITE_OTHER): Payer: Medicare Other | Admitting: Family

## 2018-02-25 ENCOUNTER — Encounter: Payer: Self-pay | Admitting: Family

## 2018-02-25 VITALS — BP 124/68 | HR 89 | Temp 97.6°F | Ht 64.0 in | Wt 131.4 lb

## 2018-02-25 DIAGNOSIS — R2681 Unsteadiness on feet: Secondary | ICD-10-CM | POA: Diagnosis not present

## 2018-02-25 DIAGNOSIS — E1129 Type 2 diabetes mellitus with other diabetic kidney complication: Secondary | ICD-10-CM

## 2018-02-25 DIAGNOSIS — W19XXXA Unspecified fall, initial encounter: Secondary | ICD-10-CM | POA: Diagnosis not present

## 2018-02-25 DIAGNOSIS — E1165 Type 2 diabetes mellitus with hyperglycemia: Secondary | ICD-10-CM

## 2018-02-25 DIAGNOSIS — IMO0002 Reserved for concepts with insufficient information to code with codable children: Secondary | ICD-10-CM

## 2018-02-25 NOTE — Progress Notes (Signed)
Provider: Keagen Heinlen FNP-C  Lauree Chandler, NP  Patient Care Team: Lauree Chandler, NP as PCP - General (Geriatric Medicine)  Extended Emergency Contact Information Primary Emergency Contact: Ditter,Anthony Address: [address]          [place], [place] [address] [place] Phone: [phone] Mobile Phone: [phone] Relation: Son Secondary Emergency Contact: Almgren,Jeffery Address: [address]          [place], [place] [address] [place] Phone: [phone] Mobile Phone: [phone] Relation: Son  Goals of care: Advanced Directive information Advanced Directives 02/25/2018  Does Patient Have a Medical Advance Directive? No  Would patient like information on creating a medical advance directive? No - Patient declined     Chief Complaint  Patient presents with  . Follow-up    2 week follow up blood sugar patient states blood sugar has not check and can not get glucometer work.   . Medication Management    Patient's son states patient is not taking insulin or other medications and is picking which medications to take. Patient is very confused and is not compliant with taking medications even when she is told to take them.     HPI:  Pt is a 84 y.o. female seen today for an acute visit for follow up blood sugars.she is here today escorted by son who provides additional information.she states has not been able to check her blood sugars because the glucometer does not work.Upon review of glucometer latest CBG readings ranging in the 130's-200's.Son states patient is not taking insulin or other medications and is picking which medications to take.In addition he states patient is very confused and is not compliant with taking medications even when she is told to take them.Patient states unable to swallow big pills " I can take whatever I want and leave whatever I don't like".son also states patient and sister eats out for all their meals  and rarely cook.she does not adhere to diabetic recommended diet.  Son also state patient has fallen and trips easily in the house.she recently fell and hit her right hip.she states right hip hurts but has improved.she denies hitting her head or loosing consciousness.    Past Medical History:  Diagnosis Date  . Carpal tunnel syndrome   . Diabetes mellitus without complication ([place])   . Dysphagia   . GERD (gastroesophageal reflux disease)   . Hiatal hernia   . HLD (hyperlipidemia)   . Hx of adenomatous colonic polyps 06/19/2014  . Hypertension   . Hypothyroidism   . Onychomycosis 03/05/2012  . Pain in lower limb 04/18/2013  . Sciatic nerve pain   . Vertigo    Past Surgical History:  Procedure Laterality Date  . BLADDER SURGERY     Mesh implant  . COLONOSCOPY    . LARYNX SURGERY     vocal cords  . PARTIAL HYSTERECTOMY  1978  . VESICOVAGINAL FISTULA CLOSURE W/ TAH  1976    Allergies  Allergen Reactions  . Lantus [Insulin Glargine] Itching and Swelling    angioedema  . Levemir [Insulin Detemir] Itching and Swelling    angioedema  . Pineapple Anaphylaxis  . Codeine Other (See Comments)    unknown  . Invokana [Canagliflozin] Other (See Comments)    Constipation, nightmares  . Penicillins Other (See Comments)    Unknown   . Tradjenta [Linagliptin] Other (See Comments)    Unknown  . Vioxx [Rofecoxib] Other (See Comments)    unknown  Outpatient Encounter Medications as of 02/25/2018  Medication Sig  . amLODipine (NORVASC) 5 MG tablet Take 5 mg by mouth daily.  Marilynne Drivers LANCE LANCETS 21G MISC 1 each by Does not apply route 4 (four) times daily. DX E11.65  . atorvastatin (LIPITOR) 80 MG tablet Take 80 mg by mouth daily.  . Blood Glucose Monitoring Suppl (ONETOUCH VERIO) w/Device KIT 1 each by Does not apply route 3 (three) times daily. Use to test blood sugar three times daily. Dx: E11.21  . citalopram (CELEXA) 10 MG tablet Take 1 tablet (10 mg total) by mouth daily For  anxiety in the morning  . clopidogrel (PLAVIX) 75 MG tablet Take 1 tablet (75 mg total) by mouth daily in the morning for history of stroke  . donepezil (ARICEPT) 10 MG tablet Take 1 tablet (10 mg total) by mouth at bedtime. For memory  . gabapentin (NEURONTIN) 100 MG capsule Take 100 mg by mouth 4 (four) times daily.  Marland Kitchen glucose blood (ONETOUCH VERIO) test strip Check blood sugar three times daily DX E11.21 (One Touch Verio IQ)  . insulin aspart protamine- aspart (NOVOLOG MIX 70/30) (70-30) 100 UNIT/ML injection Inject into the skin. 100-150= 1 unit, 151-200= 2 units, 201-250= 3 units, 251-300= 5 units, 301-350= 7 units, greater than 350= 9 units  . insulin detemir (LEVEMIR) 100 UNIT/ML injection Inject 0.16 mLs (16 Units total) into the skin daily.  . Insulin Pen Needle (PEN NEEDLES) 31G X 5 MM MISC 1 each by Does not apply route as needed. Use when giving insulin injections. Dx: E11.9  . levothyroxine (SYNTHROID, LEVOTHROID) 150 MCG tablet take 1 tablet by mouth every morning ON AN EMPTY STOMACH for THROID in the morning  . magnesium oxide (MAG-OX) 400 MG tablet Take 400 mg by mouth daily.  . metFORMIN (GLUCOPHAGE) 1000 MG tablet Take 1,000 mg by mouth 2 (two) times daily with a meal.  . omeprazole (PRILOSEC) 20 MG capsule Take 20 mg by mouth daily.  . ondansetron (ZOFRAN) 4 MG tablet Take 4 mg by mouth every 6 (six) hours as needed for nausea or vomiting.   No facility-administered encounter medications on file as of 02/25/2018.     Review of Systems  Constitutional: Negative for appetite change, chills, fever and unexpected weight change.  HENT: Negative for congestion, postnasal drip, rhinorrhea, sinus pressure, sinus pain, sneezing and sore throat.   Eyes: Negative for discharge, redness and itching.  Respiratory: Negative for cough, chest tightness, shortness of breath and wheezing.   Cardiovascular: Negative for chest pain, palpitations and leg swelling.  Gastrointestinal: Negative for  abdominal distention, abdominal pain, constipation, diarrhea, nausea and vomiting.  Endocrine: Negative for polydipsia, polyphagia and polyuria.  Genitourinary: Negative for dysuria, flank pain and urgency.  Musculoskeletal: Positive for arthralgias and gait problem.  Skin: Negative for color change, pallor and rash.  Neurological: Negative for dizziness, light-headedness and headaches.  Psychiatric/Behavioral: Negative for agitation and sleep disturbance. The patient is not nervous/anxious.     Immunization History  Administered Date(s) Administered  . Influenza Whole 09/13/2011  . Influenza, High Dose Seasonal PF 10/11/2017  . Influenza,inj,Quad PF,6+ Mos 12/05/2013, 12/31/2014, 11/15/2015, 02/12/2017  . Pneumococcal Conjugate-13 03/27/2014  . Pneumococcal Polysaccharide-23 04/28/2016  . Td 06/13/2003   Pertinent  Health Maintenance Due  Topic Date Due  . URINE MICROALBUMIN  12/06/2014  . FOOT EXAM  02/12/2018  . HEMOGLOBIN A1C  08/11/2018  . OPHTHALMOLOGY EXAM  09/24/2018  . INFLUENZA VACCINE  Completed  . DEXA SCAN  Completed  . PNA vac Low Risk Adult  Completed   Fall Risk  02/25/2018 02/10/2018 01/04/2018 10/11/2017 07/30/2017  Falls in the past year? '1 1 1 ' No No  Number falls in past yr: '1 1 1 ' - -  Injury with Fall? 1 1 0 - -  Comment - - - - -  Risk Factor Category  - - - - -  Risk for fall due to : - - - - -  Risk for fall due to: Comment - - - - -  Follow up - - - - -      Vitals:   02/25/18 1608  BP: 124/68  Pulse: 89  Temp: 97.6 F (36.4 C)  TempSrc: Oral  SpO2: 98%  Weight: 131 lb 6.4 oz (59.6 kg)  Height: '5\' 4"'  (1.626 m)   Body mass index is 22.55 kg/m. Physical Exam Constitutional:      General: She is not in acute distress.    Appearance: Normal appearance. She is normal weight.  HENT:     Head: Normocephalic.     Right Ear: Tympanic membrane, ear canal and external ear normal. There is no impacted cerumen.     Left Ear: Tympanic membrane, ear  canal and external ear normal. There is no impacted cerumen.     Nose: Nose normal. No congestion or rhinorrhea.     Mouth/Throat:     Mouth: Mucous membranes are moist.     Pharynx: Oropharynx is clear. No oropharyngeal exudate or posterior oropharyngeal erythema.  Eyes:     General: No scleral icterus.       Right eye: No discharge.        Left eye: No discharge.     Conjunctiva/sclera: Conjunctivae normal.     Pupils: Pupils are equal, round, and reactive to light.  Neck:     Musculoskeletal: Normal range of motion. No neck rigidity or muscular tenderness.  Cardiovascular:     Rate and Rhythm: Normal rate and regular rhythm.     Pulses: Normal pulses.     Heart sounds: Murmur present. No friction rub. No gallop.   Pulmonary:     Effort: Pulmonary effort is normal. No respiratory distress.     Breath sounds: Normal breath sounds. No wheezing, rhonchi or rales.  Chest:     Chest wall: No tenderness.  Abdominal:     General: Bowel sounds are normal. There is no distension.     Palpations: Abdomen is soft. There is no mass.     Tenderness: There is no abdominal tenderness. There is no right CVA tenderness, left CVA tenderness, guarding or rebound.  Musculoskeletal:        General: No swelling or tenderness.     Right lower leg: No edema.     Left lower leg: No edema.     Comments: Unsteady gait.  Lymphadenopathy:     Cervical: No cervical adenopathy.  Skin:    General: Skin is warm and dry.     Coloration: Skin is not pale.     Findings: No bruising, erythema or rash.  Neurological:     Cranial Nerves: No cranial nerve deficit.     Sensory: No sensory deficit.     Motor: No weakness.     Coordination: Coordination normal.     Gait: Gait abnormal.     Comments: pleasantly confused   Psychiatric:        Mood and Affect: Mood normal.  Speech: Speech normal.        Behavior: Behavior normal.        Thought Content: Thought content normal.        Judgment: Judgment  normal.     Labs reviewed: Recent Labs    10/15/17 0710 10/15/17 0725 01/04/18 1201 02/10/18 1059  NA 138 138 144 142  K 3.8 3.9 4.3 3.9  CL 97* 100 106 104  CO2 26  --  28 28  GLUCOSE 363* 374* 128* 245*  BUN '9 11 8 7  ' CREATININE 1.01* 0.90 0.88 0.73  CALCIUM 10.3  --  9.7 9.9   Recent Labs    07/18/17 1317 10/11/17 1357 10/15/17 0710 02/10/18 1059  AST '15 12 16 11  ' ALT '14 11 14 11  ' ALKPHOS 58  --  68  --   BILITOT 1.0 0.7 1.1 0.6  PROT 6.9 6.7 7.1 6.3  ALBUMIN 3.9  --  4.0  --    Recent Labs    10/15/17 0710 10/15/17 0725 01/04/18 1201 02/04/18 1135  WBC 7.4  --  7.6 4.9  NEUTROABS 4.4  --  4,233 2,759  HGB 14.1 15.0 11.9 11.9  HCT 43.4 44.0 36.6 37.1  MCV 83.8  --  81.9 83.6  PLT 240  --  291 218   Lab Results  Component Value Date   TSH 0.02 (L) 02/10/2018   Lab Results  Component Value Date   HGBA1C 9.5 (H) 02/10/2018   Lab Results  Component Value Date   CHOL 234 (H) 02/10/2018   HDL 46 (L) 02/10/2018   LDLCALC 168 (H) 02/10/2018   TRIG 96 02/10/2018   CHOLHDL 5.1 (H) 02/10/2018    Significant Diagnostic Results in last 30 days:  No results found.  Assessment/Plan 1. DM type 2, uncontrolled, with renal complications (HCC) No recent CBG readings.unclear if giving self insulin and does not check her CBG.Not following diabetic recommended diet.Eats out with her sister for meals. - Home Health Nurse for diabetic education and medication management. - Face-to-face encounter for Home health referral completed. - Recommend placement in higher level of care assisted living due to inability to check her blood sugars and for assistance with medication administration.Patient's son in agreement will look for ALF then notify provider for FL2.   2. Fall, initial encounter Fall episode and multiple tripping accident. - Home Health Physical therapy for exercise,gait evaluation and muscle strengthening.Fall and safety precautions.  - Face-to-face  encounter for Physical therapy completed today.   3. Unsteady gait She remains high risk for falls due to cognitive impairment and poor safety awareness. - Home Health for physical Therapy - Face-to-face encounter completed.   Family/ staff Communication: Reviewed plan of care with patient and son.Recomend high level of care in Assisted living facility due to poor safety awareness and need for medications assistance and blood sugar checks.Patient not taking medication or checking CBG as directed.patient and son verbalized understanding.patient has no objection to moving to ALF. POA will search for a facility then notify provider for an FL2.   Labs/tests ordered: None  Follow up : 2 weeks for blood sugars evaluation   Sandrea Hughs, NP

## 2018-03-09 ENCOUNTER — Ambulatory Visit (INDEPENDENT_AMBULATORY_CARE_PROVIDER_SITE_OTHER): Payer: Medicare Other | Admitting: Family

## 2018-03-09 ENCOUNTER — Encounter: Payer: Self-pay | Admitting: Family

## 2018-03-09 VITALS — BP 118/70 | HR 74 | Temp 97.5°F | Ht 64.0 in | Wt 131.0 lb

## 2018-03-09 DIAGNOSIS — M79674 Pain in right toe(s): Secondary | ICD-10-CM

## 2018-03-09 DIAGNOSIS — W19XXXD Unspecified fall, subsequent encounter: Secondary | ICD-10-CM

## 2018-03-09 DIAGNOSIS — E1165 Type 2 diabetes mellitus with hyperglycemia: Secondary | ICD-10-CM

## 2018-03-09 DIAGNOSIS — I10 Essential (primary) hypertension: Secondary | ICD-10-CM | POA: Diagnosis not present

## 2018-03-09 DIAGNOSIS — R2681 Unsteadiness on feet: Secondary | ICD-10-CM | POA: Diagnosis not present

## 2018-03-09 DIAGNOSIS — Y92009 Unspecified place in unspecified non-institutional (private) residence as the place of occurrence of the external cause: Secondary | ICD-10-CM

## 2018-03-09 DIAGNOSIS — IMO0002 Reserved for concepts with insufficient information to code with codable children: Secondary | ICD-10-CM

## 2018-03-09 DIAGNOSIS — E1129 Type 2 diabetes mellitus with other diabetic kidney complication: Secondary | ICD-10-CM

## 2018-03-09 NOTE — Progress Notes (Signed)
Provider: Dinah Ngetich FNP-C  Lauree Chandler, NP  Patient Care Team: Lauree Chandler, NP as PCP - General (Geriatric Medicine)  Extended Emergency Contact Information Primary Emergency Contact: Homesley,Anthony Address: [address]          [place], [place] [address] [place] Phone: [phone] Mobile Phone: [phone] Relation: Son Secondary Emergency Contact: Manzer,Jeffery Address: [address]          [place], [place] [address] [place] Phone: [phone] Mobile Phone: [phone] Relation: Son  Goals of care: Advanced Directive information Advanced Directives 03/09/2018  Does Patient Have a Medical Advance Directive? No  Would patient like information on creating a medical advance directive? No - Patient declined     Chief Complaint  Patient presents with  . Follow-up    2 week follow up and patient c/o of right 2nd toe being possibly broken  . Medication Management    Patient is still forgetting to take medication     HPI:  Pt is a 84 y.o. female seen today for an acute visit for 2 weeks  follow up blood sugars.she is here with her son Preston Fleeting.she was here previous visit with another son.she states still not checking her blood sugars at home.CBG log given on previous visit but states she kept it in her " Black bag".Son states patient still skipping her medication and takes some and leaves others.she lives with her sister both eats out unable to cook due to safety precautions.patient states forgets the pot on the fire. Unclear if she has fallen since two weeks ago.son states patient falls and trips all the time.On 02/25/2018 visit provider discussed with patient and son who is not present today option to place patient in an [place] due to her poor safety awareness,falls and need for medication assistance.Patient son Preston Fleeting not sure whether the brother found a place yet.son at work today but brother will follow up.    She complains of right 2nd toe states has been painful states might have run onto her furniture in the house two weeks ago.she states toe was purple but now has resolved.she denies any fever or chills.    Past Medical History:  Diagnosis Date  . Carpal tunnel syndrome   . Dementia ([place])   . Diabetes mellitus without complication ([place])   . Dysphagia   . GERD (gastroesophageal reflux disease)   . Hiatal hernia   . HLD (hyperlipidemia)   . Hx of adenomatous colonic polyps 06/19/2014  . Hyperlipidemia   . Hypertension   . Hypothyroidism   . Onychomycosis 03/05/2012  . Pain in lower limb 04/18/2013  . Sciatic nerve pain   . Vertigo    Past Surgical History:  Procedure Laterality Date  . BLADDER SURGERY     Mesh implant  . COLONOSCOPY    . LARYNX SURGERY     vocal cords  . PARTIAL HYSTERECTOMY  1978  . VESICOVAGINAL FISTULA CLOSURE W/ TAH  1976    Allergies  Allergen Reactions  . Lantus [Insulin Glargine] Itching and Swelling    angioedema  . Levemir [Insulin Detemir] Itching and Swelling    angioedema  . Pineapple Anaphylaxis  . Codeine Other (See Comments)    unknown  . Invokana [Canagliflozin] Other (See Comments)    Constipation, nightmares  . Penicillins Other (See Comments)    Unknown   . Tradjenta [Linagliptin] Other (See Comments)    Unknown  . Vioxx [Rofecoxib] Other (See Comments)  unknown    Outpatient Encounter Medications as of 03/09/2018  Medication Sig  . amLODipine (NORVASC) 5 MG tablet Take 5 mg by mouth daily.  Marilynne Drivers LANCE LANCETS 21G MISC 1 each by Does not apply route 4 (four) times daily. DX E11.65  . atorvastatin (LIPITOR) 80 MG tablet Take 80 mg by mouth daily.  . Blood Glucose Monitoring Suppl (ONETOUCH VERIO) w/Device KIT 1 each by Does not apply route 3 (three) times daily. Use to test blood sugar three times daily. Dx: E11.21  . citalopram (CELEXA) 10 MG tablet Take 1 tablet (10 mg total) by mouth daily For anxiety in the morning  .  clopidogrel (PLAVIX) 75 MG tablet Take 1 tablet (75 mg total) by mouth daily in the morning for history of stroke  . donepezil (ARICEPT) 10 MG tablet Take 1 tablet (10 mg total) by mouth at bedtime. For memory  . gabapentin (NEURONTIN) 100 MG capsule Take 100 mg by mouth 4 (four) times daily.  Marland Kitchen glucose blood (ONETOUCH VERIO) test strip Check blood sugar three times daily DX E11.21 (One Touch Verio IQ)  . insulin aspart protamine- aspart (NOVOLOG MIX 70/30) (70-30) 100 UNIT/ML injection Inject into the skin. 100-150= 1 unit, 151-200= 2 units, 201-250= 3 units, 251-300= 5 units, 301-350= 7 units, greater than 350= 9 units  . insulin detemir (LEVEMIR) 100 UNIT/ML injection Inject 0.16 mLs (16 Units total) into the skin daily.  . Insulin Pen Needle (PEN NEEDLES) 31G X 5 MM MISC 1 each by Does not apply route as needed. Use when giving insulin injections. Dx: E11.9  . levothyroxine (SYNTHROID, LEVOTHROID) 150 MCG tablet take 1 tablet by mouth every morning ON AN EMPTY STOMACH for THROID in the morning  . magnesium oxide (MAG-OX) 400 MG tablet Take 400 mg by mouth daily.  . metFORMIN (GLUCOPHAGE) 1000 MG tablet Take 1,000 mg by mouth 2 (two) times daily with a meal.  . omeprazole (PRILOSEC) 20 MG capsule Take 20 mg by mouth daily.  . ondansetron (ZOFRAN) 4 MG tablet Take 4 mg by mouth every 6 (six) hours as needed for nausea or vomiting.   No facility-administered encounter medications on file as of 03/09/2018.     Review of Systems  Constitutional: Negative for appetite change, chills, fatigue and fever.  Eyes: Positive for visual disturbance. Negative for pain, discharge, redness and itching.  Respiratory: Negative for cough, chest tightness, shortness of breath and wheezing.   Cardiovascular: Negative for chest pain, palpitations and leg swelling.  Gastrointestinal: Negative for abdominal distention, abdominal pain, constipation, diarrhea, nausea and vomiting.  Endocrine: Negative for cold  intolerance, heat intolerance, polydipsia, polyphagia and polyuria.  Musculoskeletal: Positive for arthralgias and gait problem. Negative for joint swelling.  Skin: Negative for color change, pallor and rash.  Neurological: Negative for dizziness, weakness, light-headedness and headaches.  Psychiatric/Behavioral: Positive for confusion. Negative for agitation and sleep disturbance. The patient is not nervous/anxious.     Immunization History  Administered Date(s) Administered  . Influenza Whole 09/13/2011  . Influenza, High Dose Seasonal PF 10/11/2017  . Influenza,inj,Quad PF,6+ Mos 12/05/2013, 12/31/2014, 11/15/2015, 02/12/2017  . Pneumococcal Conjugate-13 03/27/2014  . Pneumococcal Polysaccharide-23 04/28/2016  . Td 06/13/2003   Pertinent  Health Maintenance Due  Topic Date Due  . URINE MICROALBUMIN  12/06/2014  . FOOT EXAM  02/12/2018  . HEMOGLOBIN A1C  08/11/2018  . OPHTHALMOLOGY EXAM  09/24/2018  . INFLUENZA VACCINE  Completed  . DEXA SCAN  Completed  . PNA vac Low Risk  Adult  Completed   Fall Risk  03/09/2018 02/25/2018 02/10/2018 01/04/2018 10/11/2017  Falls in the past year? 0 '1 1 1 ' No  Number falls in past yr: 0 '1 1 1 ' -  Injury with Fall? 0 1 1 0 -  Comment - - - - -  Risk Factor Category  - - - - -  Risk for fall due to : - - - - -  Risk for fall due to: Comment - - - - -  Follow up - - - - -    Vitals:   03/09/18 0907  BP: 118/70  Pulse: 74  Temp: (!) 97.5 F (36.4 C)  TempSrc: Oral  SpO2: 99%  Weight: 131 lb (59.4 kg)  Height: '5\' 4"'  (1.626 m)   Body mass index is 22.49 kg/m. Physical Exam Vitals signs reviewed.  Constitutional:      General: She is not in acute distress.    Appearance: She is normal weight.  HENT:     Head: Normocephalic.     Mouth/Throat:     Mouth: Mucous membranes are moist.     Pharynx: Oropharynx is clear. No oropharyngeal exudate or posterior oropharyngeal erythema.  Eyes:     General: No scleral icterus.       Right eye: No  discharge.        Left eye: No discharge.     Conjunctiva/sclera: Conjunctivae normal.     Pupils: Pupils are equal, round, and reactive to light.     Comments: Eye glasses in place   Neck:     Musculoskeletal: Normal range of motion. No muscular tenderness.     Vascular: No carotid bruit.  Cardiovascular:     Rate and Rhythm: Normal rate and regular rhythm.     Pulses: Normal pulses.     Heart sounds: Murmur present. No friction rub. No gallop.   Pulmonary:     Effort: Pulmonary effort is normal. No respiratory distress.     Breath sounds: Normal breath sounds. No wheezing, rhonchi or rales.  Chest:     Chest wall: No tenderness.  Abdominal:     General: Bowel sounds are normal. There is no distension.     Palpations: Abdomen is soft. There is no mass.     Tenderness: There is no abdominal tenderness. There is no right CVA tenderness, left CVA tenderness, guarding or rebound.  Musculoskeletal:        General: No swelling or tenderness.     Right lower leg: No edema.     Left lower leg: No edema.     Comments: Moves x 4 extremities.unsteady gait ambulates with right hand cane.  Lymphadenopathy:     Cervical: No cervical adenopathy.  Skin:    General: Skin is warm and dry.     Coloration: Skin is not pale.     Findings: No erythema or rash.  Neurological:     Mental Status: She is alert.     Motor: No weakness.     Coordination: Coordination normal.     Gait: Gait abnormal.     Comments: Alert and oriented to person,place but forgetful.  Psychiatric:        Mood and Affect: Mood normal.        Speech: Speech normal.        Behavior: Behavior normal.        Thought Content: Thought content normal.        Cognition and Memory: Memory is impaired.  Judgment: Judgment normal.     Labs reviewed: Recent Labs    10/15/17 0710 10/15/17 0725 01/04/18 1201 02/10/18 1059  NA 138 138 144 142  K 3.8 3.9 4.3 3.9  CL 97* 100 106 104  CO2 26  --  28 28  GLUCOSE 363*  374* 128* 245*  BUN '9 11 8 7  ' CREATININE 1.01* 0.90 0.88 0.73  CALCIUM 10.3  --  9.7 9.9   Recent Labs    07/18/17 1317 10/11/17 1357 10/15/17 0710 02/10/18 1059  AST '15 12 16 11  ' ALT '14 11 14 11  ' ALKPHOS 58  --  68  --   BILITOT 1.0 0.7 1.1 0.6  PROT 6.9 6.7 7.1 6.3  ALBUMIN 3.9  --  4.0  --    Recent Labs    10/15/17 0710 10/15/17 0725 01/04/18 1201 02/04/18 1135  WBC 7.4  --  7.6 4.9  NEUTROABS 4.4  --  4,233 2,759  HGB 14.1 15.0 11.9 11.9  HCT 43.4 44.0 36.6 37.1  MCV 83.8  --  81.9 83.6  PLT 240  --  291 218   Lab Results  Component Value Date   TSH 0.02 (L) 02/10/2018   Lab Results  Component Value Date   HGBA1C 9.5 (H) 02/10/2018   Lab Results  Component Value Date   CHOL 234 (H) 02/10/2018   HDL 46 (L) 02/10/2018   LDLCALC 168 (H) 02/10/2018   TRIG 96 02/10/2018   CHOLHDL 5.1 (H) 02/10/2018    Significant Diagnostic Results in last 30 days:  No results found.  Assessment/Plan 1. DM type 2, uncontrolled, with renal complications (HCC) No CBG log for evaluation.Not checking her blood sugars and skips her medication.HH ordered placed previous visit but patient does not answer her phone.Son request to be call first to arrange for [facility] RN visit.HHRN for medication management and diabetic education.   2. Essential hypertension B/p stable.continue current medication.HHRN for medication management.   3. Unsteady gait  Unclear if she has had another fall episodes since last visit.Home health physical Therapy to evaluate gait stability,exercise and muscle strengthening. Fall and safety discuss but patient has poor safety awareness due to cognitive impairment.   4.Fall at home  Sustained fall 2 weeks ago unclear if she has had another fall since then son reports patient trips and loose balance.[facility] Physical therapy for evaluation.   5. Toe pain  2 nd toe pain but has improved cause unclear.ROM without any difficulty and none tender to palpation.No swelling or  redness noted.encouraged to take Tylenol as needed for pain .    Family/ staff Communication: Reviewed plan of care with patient and son.  Labs/tests ordered: None  Follow up: 1 months for medical management for chronic issues with Sherrie Mustache NP   Sandrea Hughs, NP

## 2018-03-10 ENCOUNTER — Telehealth: Payer: Self-pay | Admitting: *Deleted

## 2018-03-10 DIAGNOSIS — I10 Essential (primary) hypertension: Secondary | ICD-10-CM | POA: Diagnosis not present

## 2018-03-10 DIAGNOSIS — F418 Other specified anxiety disorders: Secondary | ICD-10-CM

## 2018-03-10 DIAGNOSIS — E1129 Type 2 diabetes mellitus with other diabetic kidney complication: Secondary | ICD-10-CM | POA: Diagnosis not present

## 2018-03-10 DIAGNOSIS — F0390 Unspecified dementia without behavioral disturbance: Secondary | ICD-10-CM | POA: Diagnosis not present

## 2018-03-10 DIAGNOSIS — E034 Atrophy of thyroid (acquired): Secondary | ICD-10-CM

## 2018-03-10 DIAGNOSIS — F0150 Vascular dementia without behavioral disturbance: Secondary | ICD-10-CM

## 2018-03-10 DIAGNOSIS — E1165 Type 2 diabetes mellitus with hyperglycemia: Secondary | ICD-10-CM | POA: Diagnosis not present

## 2018-03-10 DIAGNOSIS — M6281 Muscle weakness (generalized): Secondary | ICD-10-CM | POA: Diagnosis not present

## 2018-03-10 DIAGNOSIS — R1310 Dysphagia, unspecified: Secondary | ICD-10-CM | POA: Diagnosis not present

## 2018-03-10 DIAGNOSIS — R2689 Other abnormalities of gait and mobility: Secondary | ICD-10-CM | POA: Diagnosis not present

## 2018-03-10 DIAGNOSIS — Z794 Long term (current) use of insulin: Secondary | ICD-10-CM | POA: Diagnosis not present

## 2018-03-10 DIAGNOSIS — Z8673 Personal history of transient ischemic attack (TIA), and cerebral infarction without residual deficits: Secondary | ICD-10-CM

## 2018-03-10 MED ORDER — CITALOPRAM HYDROBROMIDE 10 MG PO TABS: ORAL_TABLET | ORAL | 3 refills | Status: DC

## 2018-03-10 MED ORDER — PANTOPRAZOLE SODIUM 40 MG PO TBEC: 40.0000 mg | DELAYED_RELEASE_TABLET | Freq: Every day | ORAL | 2 refills | Status: DC

## 2018-03-10 MED ORDER — METFORMIN HCL 1000 MG PO TABS: 1000.0000 mg | ORAL_TABLET | Freq: Two times a day (BID) | ORAL | 3 refills | Status: DC

## 2018-03-10 MED ORDER — AMLODIPINE BESYLATE 5 MG PO TABS: 5.0000 mg | ORAL_TABLET | Freq: Every day | ORAL | 3 refills | Status: DC

## 2018-03-10 MED ORDER — LEVOTHYROXINE SODIUM 150 MCG PO TABS: ORAL_TABLET | ORAL | 3 refills | Status: DC

## 2018-03-10 MED ORDER — ATORVASTATIN CALCIUM 80 MG PO TABS: 80.0000 mg | ORAL_TABLET | Freq: Every day | ORAL | 3 refills | Status: DC

## 2018-03-10 MED ORDER — DONEPEZIL HCL 10 MG PO TABS: 10.0000 mg | ORAL_TABLET | Freq: Every day | ORAL | 3 refills | Status: DC

## 2018-03-10 MED ORDER — MAGNESIUM OXIDE 400 MG PO TABS: 400.0000 mg | ORAL_TABLET | Freq: Every day | ORAL | 3 refills | Status: DC

## 2018-03-10 MED ORDER — GABAPENTIN 100 MG PO CAPS: 100.0000 mg | ORAL_CAPSULE | Freq: Three times a day (TID) | ORAL | 3 refills | Status: DC

## 2018-03-10 MED ORDER — CLOPIDOGREL BISULFATE 75 MG PO TABS: ORAL_TABLET | ORAL | 3 refills | Status: DC

## 2018-03-10 NOTE — Telephone Encounter (Signed)
[name] with [facility] is with patient reviewing her medications. Stated that patient's medications are a mess.  Stated that patient is taking Levemir but it is in her ALLERGY List. Nurse wants to know if she should be taking it. Stated that patient states she has been taking since December and in current medication list. Please Advise.

## 2018-03-10 NOTE — Telephone Encounter (Signed)
Yes she should not be on omeprazole with Plavix  (decreases effects on plavix) to stop omeprazole and start protonix  Also gabapentin should be three times daily vs 4. Changes made to medication.

## 2018-03-10 NOTE — Telephone Encounter (Signed)
Per Dr. [place] notes since 2017 angioedema reported with lantus and levermir.D/c Lantus and levermir.

## 2018-03-10 NOTE — Telephone Encounter (Signed)
[name] called back and stated that everything Correlates with the Current medication list and patient needs Refills on all medications except insulin. Needs Rx's sent to [facility].   Rx's Pended and sent to [facility] for approval due to [place] Warning

## 2018-03-10 NOTE — Telephone Encounter (Signed)
Allison [name] wants you to address the Levemir Insulin since you saw her last.   [name] with [facility] called and stated that Levemir is in patient's allergy list and she has been taking it.   Also Pended Novolog for approval due to [name]

## 2018-03-11 ENCOUNTER — Other Ambulatory Visit: Payer: Self-pay | Admitting: Nurse Practitioner

## 2018-03-11 DIAGNOSIS — E1165 Type 2 diabetes mellitus with hyperglycemia: Secondary | ICD-10-CM | POA: Diagnosis not present

## 2018-03-11 DIAGNOSIS — F0390 Unspecified dementia without behavioral disturbance: Secondary | ICD-10-CM | POA: Diagnosis not present

## 2018-03-11 DIAGNOSIS — R2689 Other abnormalities of gait and mobility: Secondary | ICD-10-CM | POA: Diagnosis not present

## 2018-03-11 DIAGNOSIS — E1129 Type 2 diabetes mellitus with other diabetic kidney complication: Secondary | ICD-10-CM | POA: Diagnosis not present

## 2018-03-11 DIAGNOSIS — Z794 Long term (current) use of insulin: Secondary | ICD-10-CM | POA: Diagnosis not present

## 2018-03-11 DIAGNOSIS — I10 Essential (primary) hypertension: Secondary | ICD-10-CM | POA: Diagnosis not present

## 2018-03-11 MED ORDER — INSULIN ASPART PROT & ASPART (70-30 MIX) 100 UNIT/ML ~~LOC~~ SUSP: SUBCUTANEOUS | 3 refills | Status: DC

## 2018-03-11 MED ORDER — INSULIN ASPART PROT & ASPART (70-30 MIX) 100 UNIT/ML PEN: PEN_INJECTOR | SUBCUTANEOUS | 1 refills | Status: DC

## 2018-03-11 NOTE — Telephone Encounter (Signed)
[name] with [facility] Notified and agreed. She will explain to patient.  Medication list updated.

## 2018-03-11 NOTE — Telephone Encounter (Signed)
Received high dose medication alert on omperazole and citalopram and omperazole and clopidogrel

## 2018-03-11 NOTE — Telephone Encounter (Signed)
Continue with Novolog insulin but need patient to check blood sugars as directed.The patient has not been checking her blood sugars.CBG log given in the previous visit.

## 2018-03-11 NOTE — Telephone Encounter (Signed)
Should we replace the insulin therapy with something else?  Also the Novolog comes up with HIGH ALERT Warning.   Please Advise.

## 2018-03-11 NOTE — Telephone Encounter (Signed)
Why was this sent back, I already approved medications, STOPPED omeprazole and started protonix 40 mg daily.

## 2018-03-11 NOTE — Telephone Encounter (Signed)
Allison [name] with [facility] called and stated that patient request the FlexPen not the vial. Sent Flex Pen.

## 2018-03-14 DIAGNOSIS — Z794 Long term (current) use of insulin: Secondary | ICD-10-CM | POA: Diagnosis not present

## 2018-03-14 DIAGNOSIS — E1165 Type 2 diabetes mellitus with hyperglycemia: Secondary | ICD-10-CM | POA: Diagnosis not present

## 2018-03-14 DIAGNOSIS — R2689 Other abnormalities of gait and mobility: Secondary | ICD-10-CM | POA: Diagnosis not present

## 2018-03-14 DIAGNOSIS — E1129 Type 2 diabetes mellitus with other diabetic kidney complication: Secondary | ICD-10-CM | POA: Diagnosis not present

## 2018-03-14 DIAGNOSIS — I10 Essential (primary) hypertension: Secondary | ICD-10-CM | POA: Diagnosis not present

## 2018-03-14 DIAGNOSIS — F0390 Unspecified dementia without behavioral disturbance: Secondary | ICD-10-CM | POA: Diagnosis not present

## 2018-03-15 ENCOUNTER — Telehealth: Payer: Self-pay | Admitting: *Deleted

## 2018-03-15 NOTE — Telephone Encounter (Signed)
Received a fax from [place] with [facility] requesting verbal orders for ST to evaluate due to Dementia and Forgetfulness.  Verbal orders given to [facility]

## 2018-03-17 DIAGNOSIS — I10 Essential (primary) hypertension: Secondary | ICD-10-CM | POA: Diagnosis not present

## 2018-03-17 DIAGNOSIS — E1165 Type 2 diabetes mellitus with hyperglycemia: Secondary | ICD-10-CM | POA: Diagnosis not present

## 2018-03-17 DIAGNOSIS — R2689 Other abnormalities of gait and mobility: Secondary | ICD-10-CM | POA: Diagnosis not present

## 2018-03-17 DIAGNOSIS — F0390 Unspecified dementia without behavioral disturbance: Secondary | ICD-10-CM | POA: Diagnosis not present

## 2018-03-17 DIAGNOSIS — Z794 Long term (current) use of insulin: Secondary | ICD-10-CM | POA: Diagnosis not present

## 2018-03-17 DIAGNOSIS — E1129 Type 2 diabetes mellitus with other diabetic kidney complication: Secondary | ICD-10-CM | POA: Diagnosis not present

## 2018-03-18 DIAGNOSIS — E1165 Type 2 diabetes mellitus with hyperglycemia: Secondary | ICD-10-CM | POA: Diagnosis not present

## 2018-03-18 DIAGNOSIS — E1129 Type 2 diabetes mellitus with other diabetic kidney complication: Secondary | ICD-10-CM | POA: Diagnosis not present

## 2018-03-18 DIAGNOSIS — R2689 Other abnormalities of gait and mobility: Secondary | ICD-10-CM | POA: Diagnosis not present

## 2018-03-18 DIAGNOSIS — Z794 Long term (current) use of insulin: Secondary | ICD-10-CM | POA: Diagnosis not present

## 2018-03-18 DIAGNOSIS — F0390 Unspecified dementia without behavioral disturbance: Secondary | ICD-10-CM | POA: Diagnosis not present

## 2018-03-18 DIAGNOSIS — I10 Essential (primary) hypertension: Secondary | ICD-10-CM | POA: Diagnosis not present

## 2018-03-22 DIAGNOSIS — R2689 Other abnormalities of gait and mobility: Secondary | ICD-10-CM | POA: Diagnosis not present

## 2018-03-22 DIAGNOSIS — E1165 Type 2 diabetes mellitus with hyperglycemia: Secondary | ICD-10-CM | POA: Diagnosis not present

## 2018-03-22 DIAGNOSIS — Z794 Long term (current) use of insulin: Secondary | ICD-10-CM | POA: Diagnosis not present

## 2018-03-22 DIAGNOSIS — I10 Essential (primary) hypertension: Secondary | ICD-10-CM | POA: Diagnosis not present

## 2018-03-22 DIAGNOSIS — F0390 Unspecified dementia without behavioral disturbance: Secondary | ICD-10-CM | POA: Diagnosis not present

## 2018-03-22 DIAGNOSIS — E1129 Type 2 diabetes mellitus with other diabetic kidney complication: Secondary | ICD-10-CM | POA: Diagnosis not present

## 2018-03-23 DIAGNOSIS — E1129 Type 2 diabetes mellitus with other diabetic kidney complication: Secondary | ICD-10-CM | POA: Diagnosis not present

## 2018-03-23 DIAGNOSIS — Z794 Long term (current) use of insulin: Secondary | ICD-10-CM | POA: Diagnosis not present

## 2018-03-23 DIAGNOSIS — F0390 Unspecified dementia without behavioral disturbance: Secondary | ICD-10-CM | POA: Diagnosis not present

## 2018-03-23 DIAGNOSIS — R2689 Other abnormalities of gait and mobility: Secondary | ICD-10-CM | POA: Diagnosis not present

## 2018-03-23 DIAGNOSIS — I10 Essential (primary) hypertension: Secondary | ICD-10-CM | POA: Diagnosis not present

## 2018-03-23 DIAGNOSIS — E1165 Type 2 diabetes mellitus with hyperglycemia: Secondary | ICD-10-CM | POA: Diagnosis not present

## 2018-03-29 DIAGNOSIS — F0390 Unspecified dementia without behavioral disturbance: Secondary | ICD-10-CM | POA: Diagnosis not present

## 2018-03-29 DIAGNOSIS — R2689 Other abnormalities of gait and mobility: Secondary | ICD-10-CM | POA: Diagnosis not present

## 2018-03-29 DIAGNOSIS — Z794 Long term (current) use of insulin: Secondary | ICD-10-CM | POA: Diagnosis not present

## 2018-03-29 DIAGNOSIS — I10 Essential (primary) hypertension: Secondary | ICD-10-CM | POA: Diagnosis not present

## 2018-03-29 DIAGNOSIS — E1129 Type 2 diabetes mellitus with other diabetic kidney complication: Secondary | ICD-10-CM | POA: Diagnosis not present

## 2018-03-29 DIAGNOSIS — E1165 Type 2 diabetes mellitus with hyperglycemia: Secondary | ICD-10-CM | POA: Diagnosis not present

## 2018-04-05 DIAGNOSIS — E1129 Type 2 diabetes mellitus with other diabetic kidney complication: Secondary | ICD-10-CM | POA: Diagnosis not present

## 2018-04-05 DIAGNOSIS — Z794 Long term (current) use of insulin: Secondary | ICD-10-CM | POA: Diagnosis not present

## 2018-04-05 DIAGNOSIS — I10 Essential (primary) hypertension: Secondary | ICD-10-CM | POA: Diagnosis not present

## 2018-04-05 DIAGNOSIS — F0390 Unspecified dementia without behavioral disturbance: Secondary | ICD-10-CM | POA: Diagnosis not present

## 2018-04-05 DIAGNOSIS — R2689 Other abnormalities of gait and mobility: Secondary | ICD-10-CM | POA: Diagnosis not present

## 2018-04-05 DIAGNOSIS — E1165 Type 2 diabetes mellitus with hyperglycemia: Secondary | ICD-10-CM | POA: Diagnosis not present

## 2018-04-08 ENCOUNTER — Ambulatory Visit: Payer: Medicare Other | Admitting: Family

## 2018-04-15 ENCOUNTER — Ambulatory Visit: Payer: Medicare Other | Admitting: Podiatry

## 2018-04-18 ENCOUNTER — Ambulatory Visit: Payer: Medicare Other | Admitting: Podiatry

## 2018-04-25 ENCOUNTER — Telehealth: Payer: Self-pay

## 2018-04-25 NOTE — Telephone Encounter (Signed)
I called [facility] per Sherrie Mustache, NP request to clarify if patient is under their care does that mean we are no longer consider patients PCP.  Per representative with [facility], that is correct. [facility] is now patient's PCP  Care Team updated (removed Janett Billow)

## 2018-05-19 ENCOUNTER — Other Ambulatory Visit: Payer: Self-pay | Admitting: Nurse Practitioner

## 2018-06-03 ENCOUNTER — Other Ambulatory Visit: Payer: Self-pay

## 2018-06-03 ENCOUNTER — Encounter: Payer: Medicare Other | Admitting: Family

## 2018-06-06 NOTE — Progress Notes (Signed)
This encounter was created in error - please disregard.

## 2018-06-07 ENCOUNTER — Ambulatory Visit: Payer: Self-pay | Admitting: Nurse Practitioner

## 2018-07-01 ENCOUNTER — Encounter: Payer: Medicare Other | Admitting: Nurse Practitioner

## 2018-07-01 ENCOUNTER — Ambulatory Visit: Payer: Self-pay

## 2018-07-04 DIAGNOSIS — R000 Tachycardia, unspecified: Secondary | ICD-10-CM | POA: Diagnosis not present

## 2018-07-04 DIAGNOSIS — R1084 Generalized abdominal pain: Secondary | ICD-10-CM | POA: Diagnosis not present

## 2018-07-04 DIAGNOSIS — E1165 Type 2 diabetes mellitus with hyperglycemia: Secondary | ICD-10-CM | POA: Diagnosis not present

## 2018-07-04 DIAGNOSIS — R52 Pain, unspecified: Secondary | ICD-10-CM | POA: Diagnosis not present

## 2018-07-04 DIAGNOSIS — R442 Other hallucinations: Secondary | ICD-10-CM | POA: Diagnosis not present

## 2018-12-01 ENCOUNTER — Other Ambulatory Visit: Payer: Self-pay

## 2018-12-01 ENCOUNTER — Emergency Department (HOSPITAL_COMMUNITY): Payer: Medicare (Managed Care)

## 2018-12-01 ENCOUNTER — Emergency Department (HOSPITAL_COMMUNITY)
Admission: EM | Admit: 2018-12-01 | Discharge: 2018-12-01 | Disposition: A | Discharge status: Home / Self Care | Payer: Medicare (Managed Care) | Attending: Emergency Medicine | Admitting: Emergency Medicine

## 2018-12-01 ENCOUNTER — Encounter (HOSPITAL_COMMUNITY): Payer: Self-pay | Admitting: Emergency Medicine

## 2018-12-01 DIAGNOSIS — Z794 Long term (current) use of insulin: Secondary | ICD-10-CM | POA: Insufficient documentation

## 2018-12-01 DIAGNOSIS — I10 Essential (primary) hypertension: Secondary | ICD-10-CM | POA: Diagnosis not present

## 2018-12-01 DIAGNOSIS — Z79899 Other long term (current) drug therapy: Secondary | ICD-10-CM | POA: Insufficient documentation

## 2018-12-01 DIAGNOSIS — R531 Weakness: Secondary | ICD-10-CM | POA: Insufficient documentation

## 2018-12-01 DIAGNOSIS — E039 Hypothyroidism, unspecified: Secondary | ICD-10-CM | POA: Diagnosis not present

## 2018-12-01 DIAGNOSIS — R110 Nausea: Secondary | ICD-10-CM | POA: Insufficient documentation

## 2018-12-01 DIAGNOSIS — R1111 Vomiting without nausea: Secondary | ICD-10-CM | POA: Diagnosis not present

## 2018-12-01 DIAGNOSIS — R1011 Right upper quadrant pain: Secondary | ICD-10-CM | POA: Diagnosis not present

## 2018-12-01 DIAGNOSIS — E119 Type 2 diabetes mellitus without complications: Secondary | ICD-10-CM | POA: Insufficient documentation

## 2018-12-01 DIAGNOSIS — I739 Peripheral vascular disease, unspecified: Secondary | ICD-10-CM | POA: Diagnosis not present

## 2018-12-01 DIAGNOSIS — E1165 Type 2 diabetes mellitus with hyperglycemia: Secondary | ICD-10-CM | POA: Diagnosis not present

## 2018-12-01 LAB — CBC
HCT: 40.3 % (ref 36.0–46.0)
Hemoglobin: 13.3 g/dL (ref 12.0–15.0)
MCH: 27.1 pg (ref 26.0–34.0)
MCHC: 33 g/dL (ref 30.0–36.0)
MCV: 82.1 fL (ref 80.0–100.0)
Platelets: 267 10*3/uL (ref 150–400)
RBC: 4.91 MIL/uL (ref 3.87–5.11)
RDW: 13.2 % (ref 11.5–15.5)
WBC: 8.7 10*3/uL (ref 4.0–10.5)
nRBC: 0 % (ref 0.0–0.2)

## 2018-12-01 LAB — URINALYSIS, ROUTINE W REFLEX MICROSCOPIC
Bilirubin Urine: NEGATIVE
Glucose, UA: >=500 mg/dL — AB
Hgb urine dipstick: NEGATIVE
Ketones, ur: NEGATIVE mg/dL
Leukocytes,Ua: NEGATIVE
Nitrite: NEGATIVE
Protein, ur: NEGATIVE mg/dL
Specific Gravity, Urine: 1.01 (ref 1.005–1.030)
pH: 7 (ref 5.0–8.0)

## 2018-12-01 LAB — BASIC METABOLIC PANEL
Anion gap: 13 (ref 5–15)
BUN: 6 mg/dL — ABNORMAL LOW (ref 8–23)
CO2: 24 mmol/L (ref 22–32)
Calcium: 9.4 mg/dL (ref 8.9–10.3)
Chloride: 101 mmol/L (ref 98–111)
Creatinine, Ser: 0.82 mg/dL (ref 0.44–1.00)
GFR calc Af Amer: >60 mL/min (ref >60)
GFR calc non Af Amer: >60 mL/min (ref >60)
Glucose, Bld: 234 mg/dL — ABNORMAL HIGH (ref 70–99)
Potassium: 3.7 mmol/L (ref 3.5–5.1)
Sodium: 138 mmol/L (ref 135–145)

## 2018-12-01 LAB — HEPATIC FUNCTION PANEL
ALT: 12 U/L (ref 0–44)
AST: 15 U/L (ref 15–41)
Albumin: 3.8 g/dL (ref 3.5–5.0)
Alkaline Phosphatase: 61 U/L (ref 38–126)
Bilirubin, Direct: 0.2 mg/dL (ref 0.0–0.2)
Indirect Bilirubin: 0.6 mg/dL (ref 0.3–0.9)
Total Bilirubin: 0.8 mg/dL (ref 0.3–1.2)
Total Protein: 6.6 g/dL (ref 6.5–8.1)

## 2018-12-01 LAB — TROPONIN I (HIGH SENSITIVITY): Troponin I (High Sensitivity): <2 ng/L (ref <18)

## 2018-12-01 LAB — CBG MONITORING, ED: Glucose-Capillary: 228 mg/dL — ABNORMAL HIGH (ref 70–99)

## 2018-12-01 MED ORDER — SODIUM CHLORIDE 0.9% FLUSH
3.0000 mL | Freq: Once | INTRAVENOUS | Status: AC
  Administered 2018-12-01: 3 mL via INTRAVENOUS

## 2018-12-01 NOTE — ED Triage Notes (Signed)
Pt arrives via EMS from home with weakness, nausea for the last hour. Pt c/o RUQ pain with tenderness. Hx stroke, diabetes, HTN.  Noncompliant with medications. bp 180/100, cbg 250, hr 76, rr18, 98% RA. 18g RAC, 4mg  zofran. Denies CP, SOB.

## 2018-12-01 NOTE — ED Notes (Signed)
Pt discharge instructions reviewed with the patient. Pt verbalized understanding of all discharge instructions. Pt discharged.

## 2018-12-01 NOTE — ED Provider Notes (Signed)
[facility] EMERGENCY DEPARTMENT Provider Note   CSN: [id number] Arrival date & time: 12/01/18  1311     History   Chief Complaint Chief Complaint  Patient presents with  . Weakness    HPI Allison [name] is a 84 y.o. female with history of hypertension, diabetes, GERD who presents following an episode of abdominal pain, nausea, generalized weakness.  Patient started about 30 to 40 minutes after taking a new diabetes medicine that she started today.  Patient reports she had a bowel movement that was mostly diarrhea and she felt much better after this.  She reports she like the symptoms were almost indigestion and had a little bit of chest pain at that point.  She denies any shortness of breath, fever, cough, vomiting.  Patient reports also starting a new injectable medication for diabetes yesterday as well.  Patient states she feels very good now and back to her normal self.     HPI  Past Medical History:  Diagnosis Date  . Carpal tunnel syndrome   . Dementia ([place])   . Diabetes mellitus without complication ([place])   . Dysphagia   . GERD (gastroesophageal reflux disease)   . Hiatal hernia   . HLD (hyperlipidemia)   . Hx of adenomatous colonic polyps 06/19/2014  . Hyperlipidemia   . Hypertension   . Hypothyroidism   . Onychomycosis 03/05/2012  . Pain in lower limb 04/18/2013  . Sciatic nerve pain   . Vertigo     Patient Active Problem List   Diagnosis Date Noted  . Neurocognitive deficits 01/04/2018  . History of stroke 10/01/2016  . Anxiety state 06/03/2016  . ARI (acute respiratory infection) 01/22/2016  . Uncontrolled type 2 diabetes mellitus with hyperglycemia, with long-term current use of insulin ([place]) 10/29/2015  . Acute ischemic stroke ([place]) 10/29/2015  . Hallucinations   . Visual hallucinations 10/28/2015  . Stroke-like symptoms 10/28/2015  . CVA (cerebral vascular accident) ([place]) 10/28/2015  . Ischemic stroke ([place])   . Diabetes mellitus with  complication ([place])   . Hx of adenomatous colonic polyps 06/19/2014  . Neuropathic pain 03/27/2014  . Uncontrolled type 2 diabetes with neuropathy ([place]) 03/27/2014  . Bilateral carpal tunnel syndrome 03/27/2014  . AKI (acute kidney injury) ([place]) 08/05/2013  . DM type 2, uncontrolled, with renal complications ([place]) 55/73/2202  . Hypercalcemia 08/05/2013  . PVD (peripheral vascular disease) ([place]) 03/05/2012  . Onychomycosis 03/05/2012  . Other hammer toe (acquired) 03/05/2012  . Abnormal CXR 12/25/2011  . Cystocele 06/11/2011  . Incontinence 06/04/2011  . Depression 06/04/2011  . Hypothyroidism 06/23/2006  . Dyslipidemia 06/23/2006  . Essential hypertension 06/23/2006  . GERD 06/23/2006  . IBS 06/23/2006    Past Surgical History:  Procedure Laterality Date  . BLADDER SURGERY     Mesh implant  . COLONOSCOPY    . LARYNX SURGERY     vocal cords  . PARTIAL HYSTERECTOMY  1978  . VESICOVAGINAL FISTULA CLOSURE W/ TAH  1976     OB History   No obstetric history on file.      Home Medications    Prior to Admission medications   Medication Sig Start Date End Date Taking? Authorizing Provider  amLODipine (NORVASC) 5 MG tablet TAKE ONE TABLET BY MOUTH DAILY 03/11/18   Lauree Chandler, NP  ASSURE LANCE LANCETS 21G MISC 1 each by Does not apply route 4 (four) times daily. DX E11.65 01/04/18   Hendricks Limes, MD  atorvastatin (LIPITOR) 80 MG tablet  TAKE ONE TABLET BY MOUTH DAILY 03/11/18   Lauree Chandler, NP  Blood Glucose Monitoring Suppl (ONETOUCH VERIO) w/Device KIT 1 each by Does not apply route 3 (three) times daily. Use to test blood sugar three times daily. Dx: E11.21 06/15/17   Gildardo Cranker, DO  citalopram (CELEXA) 10 MG tablet Take 1 tablet (10 mg total) by mouth daily For anxiety in the morning 03/10/18   Lauree Chandler, NP  clopidogrel (PLAVIX) 75 MG tablet Take 1 tablet (75 mg total) by mouth daily in the morning for history of stroke 03/10/18   Lauree Chandler,  NP  donepezil (ARICEPT) 10 MG tablet Take 1 tablet (10 mg total) by mouth at bedtime. For memory 03/10/18   Lauree Chandler, NP  gabapentin (NEURONTIN) 100 MG capsule TAKE ONE CAPSULE BY MOUTH FOUR TIMES A DAY 03/11/18   Lauree Chandler, NP  glucose blood (ONETOUCH VERIO) test strip Check blood sugar three times daily DX E11.21 (One Touch Verio IQ) 06/15/17   Gildardo Cranker, DO  insulin aspart protamine - aspart (NOVOLOG MIX 70/30 FLEXPEN) (70-30) 100 UNIT/ML FlexPen 100-150=1units, 151-200=2units, 201-250=3units, 251-300=5units, 301-350=7units, greater than 350=9units 03/11/18   Ngetich, Dinah C, NP  Insulin Pen Needle (PEN NEEDLES) 31G X 5 MM MISC 1 each by Does not apply route as needed. Use when giving insulin injections. Dx: E11.9 06/15/17   Gildardo Cranker, DO  LEVEMIR FLEXTOUCH 100 UNIT/ML Pen INJECT 0.16 MLS (16 UNITS TOTAL) INTO THE SKIN DAILY. 05/19/18   Lauree Chandler, NP  levothyroxine (SYNTHROID, LEVOTHROID) 150 MCG tablet take 1 tablet by mouth every morning ON AN EMPTY STOMACH for THROID in the morning 03/10/18   Lauree Chandler, NP  magnesium oxide (MAG-OX) 400 (241.3 Mg) MG tablet TAKE ONE TABLET BY MOUTH DAILY 03/11/18   Lauree Chandler, NP  magnesium oxide (MAG-OX) 400 MG tablet Take 1 tablet (400 mg total) by mouth daily. 03/10/18   Lauree Chandler, NP  metFORMIN (GLUCOPHAGE) 1000 MG tablet TAKE TWO TABLETS BY MOUTH DAILY WITH A MEAL 03/11/18   Lauree Chandler, NP  ondansetron (ZOFRAN) 4 MG tablet Take 4 mg by mouth every 6 (six) hours as needed for nausea or vomiting.    [provider]  pantoprazole (PROTONIX) 40 MG tablet Take 1 tablet (40 mg total) by mouth daily. 03/10/18   Lauree Chandler, NP    Family History Family History  Problem Relation Age of Onset  . Alzheimer's disease Mother   . Glaucoma Mother   . Heart disease Father   . Alzheimer's disease Father   . Asthma Sister   . Allergies Sister   . Allergies Sister   . Sleep apnea Sister   .  Heart disease Sister   . Breast cancer Maternal Aunt   . Pancreatic cancer Maternal Aunt   . Prostate cancer Maternal Uncle   . Prostate cancer Paternal Uncle   . Alcoholism Maternal Uncle   . Kidney disease Maternal Aunt   . Heart disease Maternal Aunt     Social History Social History   Tobacco Use  . Smoking status: Never Smoker  . Smokeless tobacco: Never Used  Substance Use Topics  . Alcohol use: No    Alcohol/week: 0.0 standard drinks  . Drug use: No     Allergies   Lantus [insulin glargine], Levemir [insulin detemir], Pineapple, Codeine, Invokana [canagliflozin], Penicillins, Tradjenta [linagliptin], and Vioxx [rofecoxib]   Review of Systems Review of Systems  Constitutional: Negative for  chills and fever.  HENT: Negative for facial swelling and sore throat.   Respiratory: Negative for shortness of breath.   Cardiovascular: Negative for chest pain.  Gastrointestinal: Positive for abdominal pain, diarrhea and nausea. Negative for vomiting.  Genitourinary: Negative for dysuria.  Musculoskeletal: Negative for back pain.  Skin: Negative for rash and wound.  Neurological: Positive for weakness. Negative for headaches.  Psychiatric/Behavioral: The patient is not nervous/anxious.    All resolved  Physical Exam Updated Vital Signs BP 140/70 (BP Location: Right Arm)   Pulse 85   Temp 98.2 F (36.8 C)   Resp 16   Ht '5\' 4"'  (1.626 m)   Wt 63.5 kg   SpO2 99%   BMI 24.03 kg/m   Physical Exam Vitals signs and nursing note reviewed.  Constitutional:      General: She is not in acute distress.    Appearance: She is well-developed. She is not diaphoretic.  HENT:     Head: Normocephalic and atraumatic.     Mouth/Throat:     Pharynx: No oropharyngeal exudate.  Eyes:     General: No scleral icterus.       Right eye: No discharge.        Left eye: No discharge.     Conjunctiva/sclera: Conjunctivae normal.     Pupils: Pupils are equal, round, and reactive to  light.  Neck:     Musculoskeletal: Normal range of motion and neck supple.     Thyroid: No thyromegaly.  Cardiovascular:     Rate and Rhythm: Normal rate and regular rhythm.     Heart sounds: Normal heart sounds. No murmur. No friction rub. No gallop.   Pulmonary:     Effort: Pulmonary effort is normal. No respiratory distress.     Breath sounds: Normal breath sounds. No stridor. No wheezing or rales.  Abdominal:     General: Bowel sounds are normal. There is no distension.     Palpations: Abdomen is soft.     Tenderness: There is no abdominal tenderness. There is no guarding or rebound. Negative signs include Murphy's sign.  Lymphadenopathy:     Cervical: No cervical adenopathy.  Skin:    General: Skin is warm and dry.     Coloration: Skin is not pale.     Findings: No rash.  Neurological:     Mental Status: She is alert.     Coordination: Coordination normal.      ED Treatments / Results  Labs (all labs ordered are listed, but only abnormal results are displayed) Labs Reviewed  BASIC METABOLIC PANEL - Abnormal; Notable for the following components:      Result Value   Glucose, Bld 234 (*)    BUN 6 (*)    All other components within normal limits  URINALYSIS, ROUTINE W REFLEX MICROSCOPIC - Abnormal; Notable for the following components:   Glucose, UA >=500 (*)    Bacteria, UA RARE (*)    All other components within normal limits  CBG MONITORING, ED - Abnormal; Notable for the following components:   Glucose-Capillary 228 (*)    All other components within normal limits  CBC  HEPATIC FUNCTION PANEL  TROPONIN I (HIGH SENSITIVITY)    EKG EKG Interpretation  Date/Time:  Thursday December 01 2018 13:18:32 EST Ventricular Rate:  80 PR Interval:  162 QRS Duration: 66 QT Interval:  396 QTC Calculation: 456 R Axis:   50 Text Interpretation: Normal sinus rhythm Normal ECG No STEMI Confirmed by Octaviano Glow (  [id number]) on 12/01/2018 4:26:48 PM   Radiology Dg Chest  Portable 1 View  Result Date: 12/01/2018 CLINICAL DATA:  Chest and abdominal pain EXAM: PORTABLE CHEST 1 VIEW COMPARISON:  04/02/2016 FINDINGS: The heart size and mediastinal contours are within normal limits. Mild aortic atherosclerosis. Both lungs are clear. The visualized skeletal structures are unremarkable. IMPRESSION: No active disease. Electronically Signed   By: Donavan Foil M.D.   On: 12/01/2018 17:40    Procedures Procedures (including critical care time)  Medications Ordered in ED Medications  sodium chloride flush (NS) 0.9 % injection 3 mL (3 mLs Intravenous Given 12/01/18 1708)     Initial Impression / Assessment and Plan / ED Course  I have reviewed the triage vital signs and the nursing notes.  Pertinent labs & imaging results that were available during my care of the patient were reviewed by me and considered in my medical decision making (see chart for details).  Clinical Course as of Nov 30 1956  Thu Dec 01, 2018  1830 Patient was seen by myself as well as PA provider.  Briefly is an 84 year old female presents emergency department with an episode of abrupt onset nausea and abdominal pain.  This began earlier this morning.  She states she took a new pill medication for her diabetes, she is unsure the name of it but states that starts with a "C" (only celexa is visible on her med list), and shortly thereafter began having abdominal pain.  She is she felt very nauseated but tells me she did not vomit.  She had a very large bowel movement followed by looser bowel movements later in the day.  These were nonbloody.  Her sister told her to call 911 and come to the ER.  She is currently asymptomatic.  On my exam the patient is thin but comfortable appearing.   [MT]    Clinical Course User Index [MT] Wyvonnia Dusky, MD       Patient presenting initially for an episode of abdominal pain, nausea, and diarrhea, however this is now resolved.  Labs are reassuring including  hepatic function panel and troponin.  Chest x-ray is clear.  Patient with mild right upper quadrant tenderness for my attending, Dr. Langston Masker, however no significant tenderness for me.  Suspect possibility of a episode of biliary colic.  Will refer to PCP if episodes are continuing for right upper quadrant ultrasound, however no indication for emergent imaging today considering patient is back to baseline and feeling well.  She would like to go home.  Patient given return precautions.  Patient vital stable throughout ED course and discharged in satisfactory condition.  Final Clinical Impressions(s) / ED Diagnoses   Final diagnoses:  Right upper quadrant abdominal pain  Nausea    ED Discharge Orders    None       Frederica Kuster, Hershal Coria 12/01/18 1959    Wyvonnia Dusky, MD 12/02/18 [phone]

## 2018-12-01 NOTE — Discharge Instructions (Addendum)
Please follow-up with your primary doctor if you have any ongoing episodes of pain, as you may need a scan of your gallbladder. Avoid greasy foods for the next few days. Please return the emergency department if you develop any new or worsening symptoms including return of severe abdominal pain, nausea, vomiting, fever of 100.4, passing out, or any other concerning symptoms.

## 2018-12-13 ENCOUNTER — Other Ambulatory Visit (HOSPITAL_COMMUNITY): Payer: Self-pay | Admitting: Internal Medicine

## 2018-12-13 ENCOUNTER — Other Ambulatory Visit: Payer: Self-pay | Admitting: Internal Medicine

## 2018-12-13 DIAGNOSIS — K580 Irritable bowel syndrome with diarrhea: Secondary | ICD-10-CM

## 2018-12-13 DIAGNOSIS — K219 Gastro-esophageal reflux disease without esophagitis: Secondary | ICD-10-CM

## 2018-12-13 DIAGNOSIS — R1011 Right upper quadrant pain: Secondary | ICD-10-CM

## 2018-12-16 ENCOUNTER — Encounter (HOSPITAL_COMMUNITY): Payer: Self-pay

## 2018-12-16 ENCOUNTER — Other Ambulatory Visit: Payer: Self-pay | Admitting: Internal Medicine

## 2018-12-16 ENCOUNTER — Ambulatory Visit
Admission: RE | Admit: 2018-12-16 | Discharge: 2018-12-16 | Disposition: A | Discharge status: Home / Self Care | Payer: Medicare (Managed Care) | Source: Ambulatory Visit | Attending: Internal Medicine | Admitting: Internal Medicine

## 2018-12-16 ENCOUNTER — Ambulatory Visit (HOSPITAL_COMMUNITY): Payer: Medicare (Managed Care)

## 2018-12-16 DIAGNOSIS — M25572 Pain in left ankle and joints of left foot: Secondary | ICD-10-CM

## 2018-12-23 ENCOUNTER — Ambulatory Visit (HOSPITAL_COMMUNITY)
Admission: RE | Admit: 2018-12-23 | Discharge: 2018-12-23 | Disposition: A | Discharge status: Home / Self Care | Payer: Medicare (Managed Care) | Source: Ambulatory Visit | Attending: Internal Medicine | Admitting: Internal Medicine

## 2018-12-23 ENCOUNTER — Other Ambulatory Visit: Payer: Self-pay

## 2018-12-23 DIAGNOSIS — R1011 Right upper quadrant pain: Secondary | ICD-10-CM | POA: Insufficient documentation

## 2018-12-23 DIAGNOSIS — K219 Gastro-esophageal reflux disease without esophagitis: Secondary | ICD-10-CM | POA: Insufficient documentation

## 2018-12-23 DIAGNOSIS — K580 Irritable bowel syndrome with diarrhea: Secondary | ICD-10-CM | POA: Diagnosis present

## 2019-03-22 ENCOUNTER — Emergency Department (HOSPITAL_COMMUNITY)
Admission: EM | Admit: 2019-03-22 | Discharge: 2019-03-23 | Disposition: A | Discharge status: Home / Self Care | Payer: Medicare (Managed Care) | Attending: Emergency Medicine | Admitting: Emergency Medicine

## 2019-03-22 ENCOUNTER — Other Ambulatory Visit: Payer: Self-pay

## 2019-03-22 ENCOUNTER — Encounter (HOSPITAL_COMMUNITY): Payer: Self-pay | Admitting: Emergency Medicine

## 2019-03-22 ENCOUNTER — Emergency Department (HOSPITAL_COMMUNITY): Payer: Medicare (Managed Care)

## 2019-03-22 DIAGNOSIS — F0390 Unspecified dementia without behavioral disturbance: Secondary | ICD-10-CM | POA: Diagnosis not present

## 2019-03-22 DIAGNOSIS — R911 Solitary pulmonary nodule: Secondary | ICD-10-CM | POA: Insufficient documentation

## 2019-03-22 DIAGNOSIS — R072 Precordial pain: Secondary | ICD-10-CM | POA: Insufficient documentation

## 2019-03-22 DIAGNOSIS — Z794 Long term (current) use of insulin: Secondary | ICD-10-CM | POA: Insufficient documentation

## 2019-03-22 DIAGNOSIS — E119 Type 2 diabetes mellitus without complications: Secondary | ICD-10-CM | POA: Diagnosis not present

## 2019-03-22 DIAGNOSIS — E039 Hypothyroidism, unspecified: Secondary | ICD-10-CM | POA: Diagnosis not present

## 2019-03-22 DIAGNOSIS — I10 Essential (primary) hypertension: Secondary | ICD-10-CM | POA: Insufficient documentation

## 2019-03-22 DIAGNOSIS — R0789 Other chest pain: Secondary | ICD-10-CM | POA: Diagnosis present

## 2019-03-22 LAB — BASIC METABOLIC PANEL
Anion gap: 13 (ref 5–15)
BUN: 6 mg/dL — ABNORMAL LOW (ref 8–23)
CO2: 25 mmol/L (ref 22–32)
Calcium: 9.6 mg/dL (ref 8.9–10.3)
Chloride: 102 mmol/L (ref 98–111)
Creatinine, Ser: 0.81 mg/dL (ref 0.44–1.00)
GFR calc Af Amer: >60 mL/min (ref >60)
GFR calc non Af Amer: >60 mL/min (ref >60)
Glucose, Bld: 190 mg/dL — ABNORMAL HIGH (ref 70–99)
Potassium: 2.9 mmol/L — ABNORMAL LOW (ref 3.5–5.1)
Sodium: 140 mmol/L (ref 135–145)

## 2019-03-22 LAB — TROPONIN I (HIGH SENSITIVITY): Troponin I (High Sensitivity): 4 ng/L (ref <18)

## 2019-03-22 LAB — CBC
HCT: 38.1 % (ref 36.0–46.0)
Hemoglobin: 12.6 g/dL (ref 12.0–15.0)
MCH: 26.5 pg (ref 26.0–34.0)
MCHC: 33.1 g/dL (ref 30.0–36.0)
MCV: 80 fL (ref 80.0–100.0)
Platelets: 264 10*3/uL (ref 150–400)
RBC: 4.76 MIL/uL (ref 3.87–5.11)
RDW: 13.8 % (ref 11.5–15.5)
WBC: 8.9 10*3/uL (ref 4.0–10.5)
nRBC: 0 % (ref 0.0–0.2)

## 2019-03-22 LAB — PROTIME-INR
INR: 1 (ref 0.8–1.2)
Prothrombin Time: 12.9 seconds (ref 11.4–15.2)

## 2019-03-22 MED ORDER — SODIUM CHLORIDE 0.9% FLUSH: 3.0000 mL | Freq: Once | INTRAVENOUS | Status: DC

## 2019-03-22 NOTE — ED Triage Notes (Signed)
Patient arrived with EMS from home reports intermittent central chest pain this evening , denies SOB , no emesis or diaphoresis , denies chest pain at arrival , she received ASA 324 mg by EMS , CBG= 233 .

## 2019-03-23 LAB — TROPONIN I (HIGH SENSITIVITY): Troponin I (High Sensitivity): 4 ng/L (ref <18)

## 2019-03-23 MED ORDER — POTASSIUM CHLORIDE CRYS ER 20 MEQ PO TBCR
40.0000 meq | EXTENDED_RELEASE_TABLET | Freq: Once | ORAL | Status: AC
  Administered 2019-03-23: 40 meq via ORAL
  Filled 2019-03-23: qty 2

## 2019-03-23 NOTE — ED Notes (Signed)
Pt wheeled to ED Rm 29 with c/o central CP that she states "feels like indigestion." Pt reports CP has since resolved. Denies any physical symptoms presently. Denies SOB. Denies N/V/D. Pt is A&Ox4. Breathing easy, non-labored. VSS on continuous monitors. Call light placed within reach

## 2019-03-23 NOTE — ED Notes (Signed)
Potassium given per MAR. Name/DOB verified with pt

## 2019-03-23 NOTE — ED Notes (Signed)
Patient verbalizes understanding of discharge instructions. Opportunity for questioning and answers were provided. All questions answered completely.  PIV removed, catheter intact. Site dressed with gauze and tape. Armband removed by staff, pt discharged from ED. Wheeled from ED with all belongings

## 2019-03-23 NOTE — ED Notes (Signed)
EDP at bedside  

## 2019-03-23 NOTE — Discharge Instructions (Addendum)

## 2019-03-23 NOTE — ED Notes (Signed)
PIV initiated, 20 G to RAC. IV flushes with 10 cc NS without s/s of infiltration. Positive blood return noted. Secured with tape and tegaderm. Repeat troponin drawn, labeled with 2 pt identifiers, and sent to lab

## 2019-03-23 NOTE — ED Provider Notes (Signed)
Allison [name] Provider Note   CSN: [id number] Arrival date & time: 03/22/19  2240     History Chief Complaint  Patient presents with  . Chest Pain    Allison [name] is a 85 y.o. female.  The history is provided by the patient and a relative.  Chest Pain Pain location:  Substernal area Pain radiates to:  Does not radiate Pain severity:  Mild Timing:  Constant Progression:  Resolved Chronicity:  New Context: eating   Relieved by:  None tried Worsened by:  Nothing Associated symptoms: heartburn   Associated symptoms: no abdominal pain, no diaphoresis, no fever, no shortness of breath and no vomiting    Patient with history of diabetes, GERD, hyperlipidemia, hypertension, mild dementia presents with chest pain.  Patient reports over the past day she has had episodes with what feels like heartburn and gas. She reports dietary indiscretions including eating barbecue recently that induced diarrhea. No fevers or vomiting. No abdominal pain. She is now symptom-free.  Past Medical History:  Diagnosis Date  . Carpal tunnel syndrome   . Dementia ([place])   . Diabetes mellitus without complication ([place])   . Dysphagia   . GERD (gastroesophageal reflux disease)   . Hiatal hernia   . HLD (hyperlipidemia)   . Hx of adenomatous colonic polyps 06/19/2014  . Hyperlipidemia   . Hypertension   . Hypothyroidism   . Onychomycosis 03/05/2012  . Pain in lower limb 04/18/2013  . Sciatic nerve pain   . Vertigo     Patient Active Problem List   Diagnosis Date Noted  . Neurocognitive deficits 01/04/2018  . History of stroke 10/01/2016  . Anxiety state 06/03/2016  . ARI (acute respiratory infection) 01/22/2016  . Uncontrolled type 2 diabetes mellitus with hyperglycemia, with long-term current use of insulin ([place]) 10/29/2015  . Acute ischemic stroke ([place]) 10/29/2015  . Hallucinations   . Visual hallucinations 10/28/2015  . Stroke-like symptoms 10/28/2015    . CVA (cerebral vascular accident) ([place]) 10/28/2015  . Ischemic stroke ([place])   . Diabetes mellitus with complication ([place])   . Hx of adenomatous colonic polyps 06/19/2014  . Neuropathic pain 03/27/2014  . Uncontrolled type 2 diabetes with neuropathy ([place]) 03/27/2014  . Bilateral carpal tunnel syndrome 03/27/2014  . AKI (acute kidney injury) ([place]) 08/05/2013  . DM type 2, uncontrolled, with renal complications ([place]) 14/48/1856  . Hypercalcemia 08/05/2013  . PVD (peripheral vascular disease) ([place]) 03/05/2012  . Onychomycosis 03/05/2012  . Other hammer toe (acquired) 03/05/2012  . Abnormal CXR 12/25/2011  . Cystocele 06/11/2011  . Incontinence 06/04/2011  . Depression 06/04/2011  . Hypothyroidism 06/23/2006  . Dyslipidemia 06/23/2006  . Essential hypertension 06/23/2006  . GERD 06/23/2006  . IBS 06/23/2006    Past Surgical History:  Procedure Laterality Date  . BLADDER SURGERY     Mesh implant  . COLONOSCOPY    . LARYNX SURGERY     vocal cords  . PARTIAL HYSTERECTOMY  1978  . VESICOVAGINAL FISTULA CLOSURE W/ TAH  1976     OB History   No obstetric history on file.     Family History  Problem Relation Age of Onset  . Alzheimer's disease Mother   . Glaucoma Mother   . Heart disease Father   . Alzheimer's disease Father   . Asthma Sister   . Allergies Sister   . Allergies Sister   . Sleep apnea Sister   . Heart disease Sister   . Breast cancer Maternal  Aunt   . Pancreatic cancer Maternal Aunt   . Prostate cancer Maternal Uncle   . Prostate cancer Paternal Uncle   . Alcoholism Maternal Uncle   . Kidney disease Maternal Aunt   . Heart disease Maternal Aunt     Social History   Tobacco Use  . Smoking status: Never Smoker  . Smokeless tobacco: Never Used  Substance Use Topics  . Alcohol use: No    Alcohol/week: 0.0 standard drinks  . Drug use: No    Home Medications Prior to Admission medications   Medication Sig Start Date End Date Taking? Authorizing  Provider  amLODipine (NORVASC) 5 MG tablet TAKE ONE TABLET BY MOUTH DAILY 03/11/18   Lauree Chandler, NP  ASSURE LANCE LANCETS 21G MISC 1 each by Does not apply route 4 (four) times daily. DX E11.65 01/04/18   Hendricks Limes, MD  atorvastatin (LIPITOR) 80 MG tablet TAKE ONE TABLET BY MOUTH DAILY 03/11/18   Lauree Chandler, NP  Blood Glucose Monitoring Suppl (ONETOUCH VERIO) w/Device KIT 1 each by Does not apply route 3 (three) times daily. Use to test blood sugar three times daily. Dx: E11.21 06/15/17   Gildardo Cranker, DO  citalopram (CELEXA) 10 MG tablet Take 1 tablet (10 mg total) by mouth daily For anxiety in the morning 03/10/18   Lauree Chandler, NP  clopidogrel (PLAVIX) 75 MG tablet Take 1 tablet (75 mg total) by mouth daily in the morning for history of stroke 03/10/18   Lauree Chandler, NP  donepezil (ARICEPT) 10 MG tablet Take 1 tablet (10 mg total) by mouth at bedtime. For memory 03/10/18   Lauree Chandler, NP  gabapentin (NEURONTIN) 100 MG capsule TAKE ONE CAPSULE BY MOUTH FOUR TIMES A DAY 03/11/18   Lauree Chandler, NP  glucose blood (ONETOUCH VERIO) test strip Check blood sugar three times daily DX E11.21 (One Touch Verio IQ) 06/15/17   Gildardo Cranker, DO  insulin aspart protamine - aspart (NOVOLOG MIX 70/30 FLEXPEN) (70-30) 100 UNIT/ML FlexPen 100-150=1units, 151-200=2units, 201-250=3units, 251-300=5units, 301-350=7units, greater than 350=9units 03/11/18   Ngetich, Dinah C, NP  Insulin Pen Needle (PEN NEEDLES) 31G X 5 MM MISC 1 each by Does not apply route as needed. Use when giving insulin injections. Dx: E11.9 06/15/17   Gildardo Cranker, DO  LEVEMIR FLEXTOUCH 100 UNIT/ML Pen INJECT 0.16 MLS (16 UNITS TOTAL) INTO THE SKIN DAILY. 05/19/18   Lauree Chandler, NP  levothyroxine (SYNTHROID, LEVOTHROID) 150 MCG tablet take 1 tablet by mouth every morning ON AN EMPTY STOMACH for THROID in the morning 03/10/18   Lauree Chandler, NP  magnesium oxide (MAG-OX) 400 (241.3 Mg) MG tablet  TAKE ONE TABLET BY MOUTH DAILY 03/11/18   Lauree Chandler, NP  magnesium oxide (MAG-OX) 400 MG tablet Take 1 tablet (400 mg total) by mouth daily. 03/10/18   Lauree Chandler, NP  metFORMIN (GLUCOPHAGE) 1000 MG tablet TAKE TWO TABLETS BY MOUTH DAILY WITH A MEAL 03/11/18   Lauree Chandler, NP  ondansetron (ZOFRAN) 4 MG tablet Take 4 mg by mouth every 6 (six) hours as needed for nausea or vomiting.    [provider]  pantoprazole (PROTONIX) 40 MG tablet Take 1 tablet (40 mg total) by mouth daily. 03/10/18   Lauree Chandler, NP    Allergies    Lantus [insulin glargine], Levemir [insulin detemir], Pineapple, Codeine, Invokana [canagliflozin], Penicillins, Tradjenta [linagliptin], and Vioxx [rofecoxib]  Review of Systems   Review of Systems  Constitutional:  Negative for diaphoresis and fever.  Respiratory: Negative for shortness of breath.   Cardiovascular: Positive for chest pain.  Gastrointestinal: Positive for diarrhea and heartburn. Negative for abdominal pain and vomiting.  All other systems reviewed and are negative.   Physical Exam Updated Vital Signs BP (!) 171/91   Pulse 84   Temp 98.2 F (36.8 C) (Oral)   Resp 20   Ht 1.626 m ('5\' 4"' )   Wt 62 kg   SpO2 100%   BMI 23.46 kg/m   Physical Exam CONSTITUTIONAL: Elderly, no acute distress HEAD: Normocephalic/atraumatic EYES: EOMI/PERRL ENMT: Mucous membranes moist NECK: supple no meningeal signs SPINE/BACK:entire spine nontender CV: S1/S2 noted, no murmurs/rubs/gallops noted LUNGS: Lungs are clear to auscultation bilaterally, no apparent distress ABDOMEN: soft, nontender, no rebound or guarding, bowel sounds noted throughout abdomen GU:no cva tenderness NEURO: Pt is awake/alert/appropriate, moves all extremitiesx4.  No facial droop.   EXTREMITIES: pulses normal/equal, full ROM SKIN: warm, color normal PSYCH: no abnormalities of mood noted, alert and oriented to situation  ED Results / Procedures /  Treatments   Labs (all labs ordered are listed, but only abnormal results are displayed) Labs Reviewed  BASIC METABOLIC PANEL - Abnormal; Notable for the following components:      Result Value   Potassium 2.9 (*)    Glucose, Bld 190 (*)    BUN 6 (*)    All other components within normal limits  CBC  PROTIME-INR  TROPONIN I (HIGH SENSITIVITY)  TROPONIN I (HIGH SENSITIVITY)    EKG EKG Interpretation  Date/Time:  Thursday March 23 2019 02:09:13 EST Ventricular Rate:  73 PR Interval:    QRS Duration: 76 QT Interval:  407 QTC Calculation: 449 R Axis:   35 Text Interpretation: Sinus rhythm No significant change since last tracing Confirmed by Ripley Fraise [phone]) on 03/23/2019 2:34:13 AM   Radiology DG Chest 2 View  Result Date: 03/22/2019 CLINICAL DATA:  Chest pain intermittently. EXAM: CHEST - 2 VIEW COMPARISON:  December 01, 2018 and prior studies dating back to October 28, 2015 FINDINGS: Normal cardiomediastinal silhouette. Nodular soft tissue density opacifying the left posterior costophrenic angle, which could be due to an underlying pleuropulmonary abnormality or normal chest wall skin folds given the patient rotation in the lateral view. A 7 mm next select mixed pulmonary nodule was present in this region on the July 18, 2017 CT abdomen study; and the smaller right middle lobe nodules that were present on the study are not radiographically apparent. No lobar consolidation, pulmonary edema or obvious pleural effusion. No pneumothorax. A punctate calcified granuloma in the right upper lobe, similar to the October 28, 2015 radiograph. Thoracoabdominal aortic calcified atherosclerosis. Moderate skeletal degenerative change and cervicothoracic mild levocurvature. A non-STAT PRA note generated. IMPRESSION: 1. No lobar pneumonia, pulmonary edema or obvious pleural effusion. 2. Nodular soft tissue density projecting on the left posterior costophrenic angle, which could be due to an  underlying pleuropulmonary abnormality or normal chest wall skin folds given the patient rotation in the lateral view. 3. Bilateral pulmonary nodules measuring up to 7 mm in the left base. The previously recommended CT chest examination has not been performed at this facility and remains indicated if not performed elsewhere. 4. A chronic benign coarsely calcified granuloma, sequela of benign granulomatous disease. 5. Cervicothoracic mild levocurvature. Electronically Signed   By: Revonda Humphrey   On: 03/22/2019 23:33    Procedures Procedures  Medications Ordered in ED Medications  sodium chloride flush (NS) 0.9 % injection  3 mL (3 mLs Intravenous Not Given 03/23/19 0329)  potassium chloride SA (KLOR-CON) CR tablet 40 mEq (40 mEq Oral Given 03/23/19 0341)    ED Course  I have reviewed the triage vital signs and the nursing notes.  Pertinent labs & imaging results that were available during my care of the patient were reviewed by me and considered in my medical decision making (see chart for details).    MDM Rules/Calculators/A&P                      Patient presented for chest pain that she felt was due to eating barbecue pork. She reports she felt like she had indigestion. She also mentioned recent diarrhea  Overall patient appears well in no acute distress. She is chest pain-free at this time. Aside from mild hypokalemia her labs are reassuring. No acute EKG changes Patient had multiple complaints & she was convinced that her issues stem from eating pork barbecue. At this time she is well-appearing I feel she is appropriate for discharge home. I have low suspicion for ACS or other acute cause of her symptoms.  Discussed this with her sister Marzella Schlein who encouraged her to go to the ER She confirms the story she also felt that it was due to food. Patient will follow up with her PCP. Her sister was informed of the pulmonary nodules on x-ray and need for CT scan next month. This is also  listed in her paperwork At discharge Final Clinical Impression(s) / ED Diagnoses Final diagnoses:  Precordial pain  Pulmonary nodule    Rx / DC Orders ED Discharge Orders    None       Ripley Fraise, MD 03/23/19 [phone]

## 2019-03-27 ENCOUNTER — Emergency Department (HOSPITAL_COMMUNITY)
Admission: EM | Admit: 2019-03-27 | Discharge: 2019-03-28 | Disposition: A | Discharge status: Home / Self Care | Payer: Medicare (Managed Care) | Attending: Emergency Medicine | Admitting: Emergency Medicine

## 2019-03-27 ENCOUNTER — Encounter (HOSPITAL_COMMUNITY): Payer: Self-pay

## 2019-03-27 ENCOUNTER — Other Ambulatory Visit: Payer: Self-pay

## 2019-03-27 ENCOUNTER — Emergency Department (HOSPITAL_COMMUNITY): Payer: Medicare (Managed Care)

## 2019-03-27 DIAGNOSIS — Y92009 Unspecified place in unspecified non-institutional (private) residence as the place of occurrence of the external cause: Secondary | ICD-10-CM

## 2019-03-27 DIAGNOSIS — E119 Type 2 diabetes mellitus without complications: Secondary | ICD-10-CM | POA: Diagnosis not present

## 2019-03-27 DIAGNOSIS — Z20822 Contact with and (suspected) exposure to covid-19: Secondary | ICD-10-CM | POA: Diagnosis not present

## 2019-03-27 DIAGNOSIS — F321 Major depressive disorder, single episode, moderate: Secondary | ICD-10-CM | POA: Insufficient documentation

## 2019-03-27 DIAGNOSIS — Z794 Long term (current) use of insulin: Secondary | ICD-10-CM | POA: Insufficient documentation

## 2019-03-27 DIAGNOSIS — W19XXXA Unspecified fall, initial encounter: Secondary | ICD-10-CM | POA: Insufficient documentation

## 2019-03-27 DIAGNOSIS — Z638 Other specified problems related to primary support group: Secondary | ICD-10-CM

## 2019-03-27 DIAGNOSIS — Y999 Unspecified external cause status: Secondary | ICD-10-CM | POA: Diagnosis not present

## 2019-03-27 DIAGNOSIS — F0390 Unspecified dementia without behavioral disturbance: Secondary | ICD-10-CM | POA: Diagnosis not present

## 2019-03-27 DIAGNOSIS — Y939 Activity, unspecified: Secondary | ICD-10-CM | POA: Insufficient documentation

## 2019-03-27 DIAGNOSIS — Y929 Unspecified place or not applicable: Secondary | ICD-10-CM | POA: Diagnosis not present

## 2019-03-27 DIAGNOSIS — Z79899 Other long term (current) drug therapy: Secondary | ICD-10-CM | POA: Diagnosis not present

## 2019-03-27 DIAGNOSIS — R4689 Other symptoms and signs involving appearance and behavior: Secondary | ICD-10-CM | POA: Diagnosis not present

## 2019-03-27 DIAGNOSIS — E039 Hypothyroidism, unspecified: Secondary | ICD-10-CM | POA: Diagnosis not present

## 2019-03-27 DIAGNOSIS — I10 Essential (primary) hypertension: Secondary | ICD-10-CM | POA: Insufficient documentation

## 2019-03-27 DIAGNOSIS — S0990XA Unspecified injury of head, initial encounter: Secondary | ICD-10-CM | POA: Insufficient documentation

## 2019-03-27 LAB — RAPID URINE DRUG SCREEN, HOSP PERFORMED
Amphetamines: NOT DETECTED
Barbiturates: NOT DETECTED
Benzodiazepines: NOT DETECTED
Cocaine: NOT DETECTED
Opiates: NOT DETECTED
Tetrahydrocannabinol: NOT DETECTED

## 2019-03-27 LAB — COMPREHENSIVE METABOLIC PANEL
ALT: 15 U/L (ref 0–44)
AST: 17 U/L (ref 15–41)
Albumin: 3.9 g/dL (ref 3.5–5.0)
Alkaline Phosphatase: 59 U/L (ref 38–126)
Anion gap: 13 (ref 5–15)
BUN: 8 mg/dL (ref 8–23)
CO2: 28 mmol/L (ref 22–32)
Calcium: 9.3 mg/dL (ref 8.9–10.3)
Chloride: 99 mmol/L (ref 98–111)
Creatinine, Ser: 1.01 mg/dL — ABNORMAL HIGH (ref 0.44–1.00)
GFR calc Af Amer: >60 mL/min (ref >60)
GFR calc non Af Amer: 52 mL/min — ABNORMAL LOW (ref >60)
Glucose, Bld: 181 mg/dL — ABNORMAL HIGH (ref 70–99)
Potassium: 2.8 mmol/L — ABNORMAL LOW (ref 3.5–5.1)
Sodium: 140 mmol/L (ref 135–145)
Total Bilirubin: 0.7 mg/dL (ref 0.3–1.2)
Total Protein: 7 g/dL (ref 6.5–8.1)

## 2019-03-27 LAB — CBC WITH DIFFERENTIAL/PLATELET
Abs Immature Granulocytes: 0.03 10*3/uL (ref 0.00–0.07)
Basophils Absolute: 0.1 10*3/uL (ref 0.0–0.1)
Basophils Relative: 1 %
Eosinophils Absolute: 0.2 10*3/uL (ref 0.0–0.5)
Eosinophils Relative: 2 %
HCT: 40.2 % (ref 36.0–46.0)
Hemoglobin: 13 g/dL (ref 12.0–15.0)
Immature Granulocytes: 0 %
Lymphocytes Relative: 30 %
Lymphs Abs: 2.7 10*3/uL (ref 0.7–4.0)
MCH: 26.3 pg (ref 26.0–34.0)
MCHC: 32.3 g/dL (ref 30.0–36.0)
MCV: 81.4 fL (ref 80.0–100.0)
Monocytes Absolute: 1.1 10*3/uL — ABNORMAL HIGH (ref 0.1–1.0)
Monocytes Relative: 12 %
Neutro Abs: 5.2 10*3/uL (ref 1.7–7.7)
Neutrophils Relative %: 55 %
Platelets: 290 10*3/uL (ref 150–400)
RBC: 4.94 MIL/uL (ref 3.87–5.11)
RDW: 14.3 % (ref 11.5–15.5)
WBC: 9.2 10*3/uL (ref 4.0–10.5)
nRBC: 0 % (ref 0.0–0.2)

## 2019-03-27 LAB — URINALYSIS, ROUTINE W REFLEX MICROSCOPIC
Bacteria, UA: NONE SEEN
Bilirubin Urine: NEGATIVE
Glucose, UA: >=500 mg/dL — AB
Hgb urine dipstick: NEGATIVE
Ketones, ur: 5 mg/dL — AB
Leukocytes,Ua: NEGATIVE
Nitrite: NEGATIVE
Protein, ur: NEGATIVE mg/dL
Specific Gravity, Urine: 1.023 (ref 1.005–1.030)
pH: 6 (ref 5.0–8.0)

## 2019-03-27 LAB — RESPIRATORY PANEL BY RT PCR (FLU A&B, COVID)
Influenza A by PCR: NEGATIVE
Influenza B by PCR: NEGATIVE
SARS Coronavirus 2 by RT PCR: NEGATIVE

## 2019-03-27 LAB — ETHANOL: Alcohol, Ethyl (B): <10 mg/dL (ref <10)

## 2019-03-27 MED ORDER — POTASSIUM CHLORIDE CRYS ER 20 MEQ PO TBCR: 40.0000 meq | EXTENDED_RELEASE_TABLET | Freq: Once | ORAL | Status: DC

## 2019-03-27 MED ORDER — POTASSIUM CHLORIDE CRYS ER 20 MEQ PO TBCR
40.0000 meq | EXTENDED_RELEASE_TABLET | Freq: Once | ORAL | Status: AC
  Administered 2019-03-27: 40 meq via ORAL
  Filled 2019-03-27: qty 2

## 2019-03-27 MED ORDER — POTASSIUM CHLORIDE 10 MEQ/100ML IV SOLN
10.0000 meq | Freq: Once | INTRAVENOUS | Status: DC
  Filled 2019-03-27: qty 100
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
## 2019-03-27 NOTE — BH Assessment (Signed)
Assessment Note  Allison [name] is an 85 y.o. female that presents this date from [facility] with attached note from Entz[place]er MD that states, Patient woke up at 6 a.m. this date screaming and yelling that God told her she has the power to murder her sister today. Patient was due at Allison [name] today and per note, prior to leaving home patient told her sister with whom she resides, "You better be glad I did not kill you this morning." See entire note in chart for reference with extensive details. Patient denies content of letter denying any S/I, H/I or AH. Patient does voice some VH stating she sees "little shadows"  when she gets "really mad." Patient reports she resides with her younger sister at her parents "home place" and states they have been living together "for a long time." Patient states she recently suffered a fall on 03/25/19 and her sister became upset with her since she feels the fall "wasn't that bad" and "she didn't want to hear about it." Patient reports she became upset and did make threats to her sister because she was angry but not to the extent mentioned above. Patient denies any prior attempts or gestures at self harm or prior mental health history. Patient does report decreased sleep for the last month stating she only sleeps 3 to 4 hours a night. Patient states she has never been diagnosed with depression but "just might have it" reporting ongoing symptoms to include: feeling useless, isolating and excessive fatigue. Patient denies any history of abuse or SA issues. Patient states she is retired from [employer] and currently resides with her sister. Patient states "they fuss from time to time" and feels this date she doesn't want to return home tonight because she "is still mad." Patient denies that she would ever harm her sister but "has thought about it."  Patient denies any access to weapons. Patient denies any thoughts of self harm or thoughts of wanting to harm her sister. Patient reports  multiple stressors to include her grandchildren not wanting to see her and having to isolate because of COVID. Patient is oriented x 4 and presents with a plesant affect. Patient's memory is intact and does not appear to be responding to internal stimuli. Case was staffed with Rankin NP who recommended patient be observed and monitored.       Diagnosis: F32.1 MDD, single episode, moderate   Past Medical History:  Past Medical History:  Diagnosis Date  . Carpal tunnel syndrome   . Dementia ([place])   . Diabetes mellitus without complication ([place])   . Dysphagia   . GERD (gastroesophageal reflux disease)   . Hiatal hernia   . HLD (hyperlipidemia)   . Hx of adenomatous colonic polyps 06/19/2014  . Hyperlipidemia   . Hypertension   . Hypothyroidism   . Onychomycosis 03/05/2012  . Pain in lower limb 04/18/2013  . Sciatic nerve pain   . Vertigo     Past Surgical History:  Procedure Laterality Date  . BLADDER SURGERY     Mesh implant  . COLONOSCOPY    . LARYNX SURGERY     vocal cords  . PARTIAL HYSTERECTOMY  1978  . VESICOVAGINAL FISTULA CLOSURE W/ TAH  1976    Family History:  Family History  Problem Relation Age of Onset  . Alzheimer's disease Mother   . Glaucoma Mother   . Heart disease Father   . Alzheimer's disease Father   . Asthma Sister   . Allergies Sister   .  Allergies Sister   . Sleep apnea Sister   . Heart disease Sister   . Breast cancer Maternal Aunt   . Pancreatic cancer Maternal Aunt   . Prostate cancer Maternal Uncle   . Prostate cancer Paternal Uncle   . Alcoholism Maternal Uncle   . Kidney disease Maternal Aunt   . Heart disease Maternal Aunt     Social History:  reports that she has never smoked. She has never used smokeless tobacco. She reports that she does not drink alcohol or use drugs.  Additional Social History:  Alcohol / Drug Use Pain Medications: See MAR Prescriptions: See MAR Over the Counter: See MAR History of alcohol / drug use?: No  history of alcohol / drug abuse  CIWA: CIWA-Ar BP: (!) 157/83 Pulse Rate: 83 COWS:    Allergies:  Allergies  Allergen Reactions  . Lantus [Insulin Glargine] Itching and Swelling    angioedema  . Levemir [Insulin Detemir] Itching and Swelling    angioedema  . Pineapple Anaphylaxis  . Codeine Other (See Comments)    unknown  . Invokana [Canagliflozin] Other (See Comments)    Constipation, nightmares  . Penicillins Other (See Comments)    Unknown   . Tradjenta [Linagliptin] Other (See Comments)    Unknown  . Vioxx [Rofecoxib] Other (See Comments)    unknown    Home Medications: (Not in a hospital admission)   OB/GYN Status:  No LMP recorded. Patient has had a hysterectomy.  General Assessment Data Location of Assessment: [facility] TTS Assessment: In system Is this a Tele or Face-to-Face Assessment?: Face-to-Face Is this an Initial Assessment or a Re-assessment for this encounter?: Initial Assessment Patient Accompanied by:: N/A Language Other than English: No Living Arrangements: Other (Comment) What gender do you identify as?: Female Marital status: Widowed Allison [name] name: Allison [name] Pregnancy Status: No Living Arrangements: Other relatives Can pt return to current living arrangement?: Yes Admission Status: Voluntary Is patient capable of signing voluntary admission?: Yes Referral Source: Other([facility]) Insurance type: Medicare  Medical Screening Exam ([place]) Medical Exam completed: Yes  Crisis Care Plan Living Arrangements: Other relatives Legal Guardian: (NA) Name of Psychiatrist: None Name of Therapist: None  Education Status Is patient currently in school?: No Is the patient employed, unemployed or receiving disability?: Unemployed  Risk to self with the past 6 months Suicidal Ideation: No-Not Currently/Within Last 6 Months Has patient been a risk to self within the past 6 months prior to admission? : No Suicidal Intent: No Has patient had any  suicidal intent within the past 6 months prior to admission? : No Is patient at risk for suicide?: No Suicidal Plan?: No Has patient had any suicidal plan within the past 6 months prior to admission? : No Access to Means: No What has been your use of drugs/alcohol within the last 12 months?: Denies Previous Attempts/Gestures: No How many times?: 0 Other Self Harm Risks: (NA) Triggers for Past Attempts: (NA) Intentional Self Injurious Behavior: None Family Suicide History: No Recent stressful life event(s): Conflict (Comment)(Conflict with sister) Persecutory voices/beliefs?: No Depression: Yes Depression Symptoms: Isolating, Fatigue, Feeling worthless/self pity Substance abuse history and/or treatment for substance abuse?: No Suicide prevention information given to non-admitted patients: Not applicable  Risk to Others within the past 6 months Homicidal Ideation: No Does patient have any lifetime risk of violence toward others beyond the six months prior to admission? : No Thoughts of Harm to Others: Yes-Currently Present Comment - Thoughts of Harm to Others: Pt voices anger  towards sister Current Homicidal Intent: No Current Homicidal Plan: No Access to Homicidal Means: No Identified Victim: Sister History of harm to others?: No Assessment of Violence: None Noted Violent Behavior Description: (NA) Does patient have access to weapons?: No Criminal Charges Pending?: No Does patient have a court date: No Is patient on probation?: No  Psychosis Hallucinations: Visual Delusions: None noted  Mental Status Report Appearance/Hygiene: Unremarkable Eye Contact: Good Motor Activity: Freedom of movement Speech: Logical/coherent Level of Consciousness: Alert Mood: Pleasant Affect: Appropriate to circumstance Anxiety Level: Minimal Thought Processes: Coherent, Relevant Judgement: Partial Orientation: Person, Place, Time, Situation Obsessive Compulsive Thoughts/Behaviors:  None  Cognitive Functioning Concentration: Normal Memory: Recent Intact, Remote Intact Is patient IDD: No Insight: Good Impulse Control: Fair Appetite: Good Have you had any weight changes? : No Change Sleep: Decreased Total Hours of Sleep: 4 Vegetative Symptoms: None  ADLScreening [facility] Assessment Services) Patient's cognitive ability adequate to safely complete daily activities?: Yes Patient able to express need for assistance with ADLs?: Yes Independently performs ADLs?: Yes (appropriate for developmental age)  Prior Inpatient Therapy Prior Inpatient Therapy: No  Prior Outpatient Therapy Prior Outpatient Therapy: No Does patient have an ACCT team?: No Does patient have Intensive In-House Services?  : No Does patient have [facility] services? : No Does patient have P4CC services?: No  ADL Screening (condition at time of admission) Patient's cognitive ability adequate to safely complete daily activities?: Yes Is the patient deaf or have difficulty hearing?: No Does the patient have difficulty seeing, even when wearing glasses/contacts?: No Does the patient have difficulty concentrating, remembering, or making decisions?: No Patient able to express need for assistance with ADLs?: Yes Does the patient have difficulty dressing or bathing?: No Independently performs ADLs?: Yes (appropriate for developmental age) Does the patient have difficulty walking or climbing stairs?: No Weakness of Legs: None Weakness of Arms/Hands: None  Home Assistive Devices/Equipment Home Assistive Devices/Equipment: None  Therapy Consults (therapy consults require a physician order) PT Evaluation Needed: No OT Evalulation Needed: No SLP Evaluation Needed: No Abuse/Neglect Assessment (Assessment to be complete while patient is alone) Abuse/Neglect Assessment Can Be Completed: Yes Physical Abuse: Denies Verbal Abuse: Denies Sexual Abuse: Denies Exploitation of patient/patient's resources:  Denies Self-Neglect: Denies Values / Beliefs Cultural Requests During Hospitalization: None Spiritual Requests During Hospitalization: None Consults Spiritual Care Consult Needed: No Transition of Care Team Consult Needed: No Advance Directives (For Healthcare) Does Patient Have a Medical Advance Directive?: No Would patient like information on creating a medical advance directive?: No - Patient declined          Disposition: Case was staffed with Rankin NP who recommended patient be observed and monitored. Disposition Initial Assessment Completed for this Encounter: Yes Disposition of Patient: (Observe and monitor)  On Site Evaluation by:   Reviewed with Physician:    Mamie Nick 03/27/2019 6:51 PM

## 2019-03-27 NOTE — ED Triage Notes (Signed)
Patient states she fell yesterday at 0400 today. Patient states she doesn't remember exactly how she fell, but denies passing out. Patient c/o soreness to the right and left side of her head.   [facility] and told to come to the ED. Patient states she lives with her sister, Fraser Din.  Patient was brought to the ED by [facility] Staff and patient states [facility] called her sister and let her know that she came to the ED.

## 2019-03-27 NOTE — BH Assessment (Signed)
[facility] Assessment Progress Note  Case was staffed with Rankin NP who recommended patient be observed and monitored.      

## 2019-03-27 NOTE — ED Notes (Addendum)
Pt refused IV potassium stating that she was done with needles for the night. Pt did take her PO medications. Dr. Roderic Palau made aware. Pt remains cooperative and calm.

## 2019-03-28 ENCOUNTER — Encounter (HOSPITAL_COMMUNITY): Payer: Self-pay | Admitting: Registered Nurse

## 2019-03-28 DIAGNOSIS — Z638 Other specified problems related to primary support group: Secondary | ICD-10-CM

## 2019-03-28 NOTE — [facility]HH Suicide Risk Assessment (Cos[place] Needed)
Suicide Risk Assessment  Discharge Assessment   [facility] Discharge Suicide Risk Assessment   Principal Problem: Family discord Discharge Diagnoses: Principal Problem:   Family discord   Total Time spent with patient: 30 minutes  Musculoskeletal: Strength & Muscle Tone: within normal limits Gait & Station: normal Patient leans: N/A  Psychiatric Specialty Exam:   Blood pressure 137/68, pulse 69, temperature 97.9 F (36.6 C), temperature source Oral, resp. rate 18, height 5\' 4"  (1.626 m), weight 62 kg, SpO2 100 %.Body mass index is 23.46 kg/m.  General Appearance: Casual  Eye Contact::  Good  Speech:  Clear and Coherent and Normal Rate409  Volume:  Normal  Mood:  "Fine"  Affect:  Appropriate and Congruent  Thought Process:  Coherent, Goal Directed and Descriptions of Associations: Intact  Orientation:  Full (Time, Place, and Person)  Thought Content:  WDL  Suicidal Thoughts:  No  Homicidal Thoughts:  No  Memory:  Immediate;   Good Recent;   Good  Judgement:  Intact  Insight:  Present  Psychomotor Activity:  Normal  Concentration:  Good  Recall:  Good  Fund of Knowledge:Good  Language: Good  Akathisia:  No  Handed:  Right  AIMS (if indicated):     Assets:  Communication Skills Desire for Improvement Housing Social Support  Sleep:     Cognition: WNL  ADL's:  Intact   Mental Status Per Nursing Assessment::   On Admission:     Demographic Factors:  Age 85 or older  Loss Factors: NA  Historical Factors: NA  Risk Reduction Factors:   Sense of responsibility to family, Religious beliefs about death, Living with another person, especially a relative and Positive social support  Continued Clinical Symptoms:  Family discord  Cognitive Features That Contribute To Risk:  None    Suicide Risk:  Minimal: No identifiable suicidal ideation.  Patients presenting with no risk factors but with morbid ruminations; may be classified as minimal risk based on the severity  of the depressive symptoms    Plan Of Care/Follow-up recommendations:  Activity:  As tolerated Diet:  Heart healthy   Follow up with current provider  Disposition:  Psychiatrically cleared No evidence of imminent risk to self or others at present.   Patient does not meet criteria for psychiatric inpatient admission. Supportive therapy provided about ongoing stressors. Discussed crisis plan, support from social network, calling 911, coming to the Emergency Department, and calling Suicide Hotline.  Rina Adney, NP 03/28/2019, 1:55 PM

## 2019-03-28 NOTE — BH Assessment (Addendum)
[facility] Assessment Progress Note  Per Shuvon Rankin, FNP, this pt does not require psychiatric hospitalization at this time.  Pt is to be discharged from [facility].  No behavioral health referrals are indicated for this pt.  At [facility] request this writer called [facility] [phone]) to follow up.  Call was placed at 12:24, and I left a voice message for Kennyth Lose.  Return call is pending as of this writing.  Pt's nurse, Nena Jordan, has been notified.  Jalene Mullet, MA Triage Specialist [phone]   Addendum:  At 13:31 I called [facility] again.  They report that pt's son, Cydnee Borth, is to come to [facility] to pick pt up.  At 13:41 I called Dellis Filbert at [phone].  He reports that another family member will be at [facility] for the pt in 5 or 10 minutes.  Edie and Shuvon have been notified.  Jalene Mullet, [place] Coordinator [phone]

## 2019-04-13 ENCOUNTER — Other Ambulatory Visit: Payer: Self-pay | Admitting: Internal Medicine

## 2019-04-13 DIAGNOSIS — R911 Solitary pulmonary nodule: Secondary | ICD-10-CM

## 2019-04-27 ENCOUNTER — Other Ambulatory Visit: Payer: Self-pay | Admitting: Internal Medicine

## 2019-04-27 DIAGNOSIS — R911 Solitary pulmonary nodule: Secondary | ICD-10-CM

## 2019-05-01 ENCOUNTER — Ambulatory Visit
Admission: RE | Admit: 2019-05-01 | Discharge: 2019-05-01 | Disposition: A | Discharge status: Home / Self Care | Payer: Medicare (Managed Care) | Source: Ambulatory Visit | Attending: Internal Medicine | Admitting: Internal Medicine

## 2019-05-01 ENCOUNTER — Other Ambulatory Visit: Payer: Self-pay

## 2019-05-01 DIAGNOSIS — R911 Solitary pulmonary nodule: Secondary | ICD-10-CM

## 2020-06-25 ENCOUNTER — Other Ambulatory Visit: Payer: Self-pay

## 2020-06-25 ENCOUNTER — Encounter (HOSPITAL_COMMUNITY): Payer: Self-pay

## 2020-06-25 ENCOUNTER — Emergency Department (HOSPITAL_COMMUNITY)
Admission: EM | Admit: 2020-06-25 | Discharge: 2020-06-26 | Disposition: A | Discharge status: Skilled Nursing Facility | Payer: Medicare (Managed Care) | Attending: Emergency Medicine | Admitting: Emergency Medicine

## 2020-06-25 DIAGNOSIS — Z7984 Long term (current) use of oral hypoglycemic drugs: Secondary | ICD-10-CM | POA: Insufficient documentation

## 2020-06-25 DIAGNOSIS — E039 Hypothyroidism, unspecified: Secondary | ICD-10-CM | POA: Diagnosis not present

## 2020-06-25 DIAGNOSIS — F0390 Unspecified dementia without behavioral disturbance: Secondary | ICD-10-CM | POA: Insufficient documentation

## 2020-06-25 DIAGNOSIS — E1129 Type 2 diabetes mellitus with other diabetic kidney complication: Secondary | ICD-10-CM | POA: Diagnosis not present

## 2020-06-25 DIAGNOSIS — R519 Headache, unspecified: Secondary | ICD-10-CM | POA: Diagnosis present

## 2020-06-25 DIAGNOSIS — E1165 Type 2 diabetes mellitus with hyperglycemia: Secondary | ICD-10-CM | POA: Diagnosis not present

## 2020-06-25 DIAGNOSIS — R112 Nausea with vomiting, unspecified: Secondary | ICD-10-CM | POA: Insufficient documentation

## 2020-06-25 DIAGNOSIS — Z794 Long term (current) use of insulin: Secondary | ICD-10-CM | POA: Diagnosis not present

## 2020-06-25 DIAGNOSIS — Z79899 Other long term (current) drug therapy: Secondary | ICD-10-CM | POA: Insufficient documentation

## 2020-06-25 DIAGNOSIS — R251 Tremor, unspecified: Secondary | ICD-10-CM | POA: Insufficient documentation

## 2020-06-25 DIAGNOSIS — I10 Essential (primary) hypertension: Secondary | ICD-10-CM | POA: Diagnosis not present

## 2020-06-25 DIAGNOSIS — E1140 Type 2 diabetes mellitus with diabetic neuropathy, unspecified: Secondary | ICD-10-CM | POA: Insufficient documentation

## 2020-06-25 LAB — COMPREHENSIVE METABOLIC PANEL
ALT: 14 U/L (ref 0–44)
AST: 17 U/L (ref 15–41)
Albumin: 3.9 g/dL (ref 3.5–5.0)
Alkaline Phosphatase: 61 U/L (ref 38–126)
Anion gap: 13 (ref 5–15)
BUN: 15 mg/dL (ref 8–23)
CO2: 26 mmol/L (ref 22–32)
Calcium: 9.9 mg/dL (ref 8.9–10.3)
Chloride: 99 mmol/L (ref 98–111)
Creatinine, Ser: 0.96 mg/dL (ref 0.44–1.00)
GFR, Estimated: 59 mL/min — ABNORMAL LOW (ref >60)
Glucose, Bld: 170 mg/dL — ABNORMAL HIGH (ref 70–99)
Potassium: 3.8 mmol/L (ref 3.5–5.1)
Sodium: 138 mmol/L (ref 135–145)
Total Bilirubin: 0.7 mg/dL (ref 0.3–1.2)
Total Protein: 7.1 g/dL (ref 6.5–8.1)

## 2020-06-25 LAB — URINALYSIS, ROUTINE W REFLEX MICROSCOPIC
Bacteria, UA: NONE SEEN
Bilirubin Urine: NEGATIVE
Glucose, UA: >=500 mg/dL — AB
Hgb urine dipstick: NEGATIVE
Ketones, ur: NEGATIVE mg/dL
Leukocytes,Ua: NEGATIVE
Nitrite: NEGATIVE
Protein, ur: NEGATIVE mg/dL
Specific Gravity, Urine: 1.005 (ref 1.005–1.030)
pH: 8 (ref 5.0–8.0)

## 2020-06-25 LAB — CBG MONITORING, ED: Glucose-Capillary: 166 mg/dL — ABNORMAL HIGH (ref 70–99)

## 2020-06-25 LAB — CBC
HCT: 40.1 % (ref 36.0–46.0)
Hemoglobin: 13.6 g/dL (ref 12.0–15.0)
MCH: 27.6 pg (ref 26.0–34.0)
MCHC: 33.9 g/dL (ref 30.0–36.0)
MCV: 81.5 fL (ref 80.0–100.0)
Platelets: 278 10*3/uL (ref 150–400)
RBC: 4.92 MIL/uL (ref 3.87–5.11)
RDW: 14.4 % (ref 11.5–15.5)
WBC: 13.6 10*3/uL — ABNORMAL HIGH (ref 4.0–10.5)
nRBC: 0 % (ref 0.0–0.2)

## 2020-06-25 LAB — LIPASE, BLOOD: Lipase: 51 U/L (ref 11–51)

## 2020-06-25 MED ORDER — ONDANSETRON 4 MG PO TBDP: 4.0000 mg | ORAL_TABLET | Freq: Once | ORAL | Status: DC | PRN

## 2020-06-25 NOTE — ED Triage Notes (Signed)
[facility]ors, nausea, vomiting x2 no blood in emesis and headache. Headache has resolved, pt still feels nauseous. Pt has not been eating or drinking other than pepsi.  No recent falls or injuries. Before today pt was normal.   BP 170/80, hx of same CBG 126 98% RA  HR 80

## 2020-06-25 NOTE — ED Provider Notes (Signed)
Emergency Medicine Provider Triage Evaluation Note  Allison [name] , a 86 y.o. female  was evaluated in triage.  Pt complains of vomiting, headache and weakness.    Review of Systems  Positive: shaking Negative: fever  Physical Exam  BP (!) 185/91 (BP Location: Right Arm)   Pulse 74   Temp 97.9 F (36.6 C) (Oral)   Resp 16   SpO2 97%  Gen:   Awake, no distress   Resp:  Normal effort  MSK:   Moves extremities without difficulty  Other:    Medical Decision Making  Medically screening exam initiated at 6:22 PM.  Appropriate orders placed.  JAYLIN ROUNDY was informed that the remainder of the evaluation will be completed by another provider, this initial triage assessment does not replace that evaluation, and the importance of remaining in the ED until their evaluation is complete.     Fransico Meadow, Hershal Coria 06/25/20 Trecia Rogers, MD 06/28/20 [id number]

## 2020-06-26 ENCOUNTER — Emergency Department (HOSPITAL_COMMUNITY): Payer: Medicare (Managed Care)

## 2020-06-26 NOTE — Discharge Instructions (Addendum)
Please follow-up with your primary care doctor.  Return to ER if you develop new headache, vomiting, numbness, weakness or other new concerning symptom.

## 2020-06-26 NOTE — ED Notes (Signed)
Joy, RN at [facility], said [facility] transport is coming to pick up pt.

## 2020-06-26 NOTE — ED Notes (Signed)
Assisted pt with brief change.

## 2020-06-26 NOTE — ED Provider Notes (Signed)
[facility] EMERGENCY DEPARTMENT Provider Note   CSN: [id number] Arrival date & time: 06/25/20  1720     History Chief Complaint  Patient presents with   Emesis   Headache    Allison [name] is a 86 y.o. female.  Patient reports that she had an episode this afternoon when she suddenly felt unwell.  States that had some shaking in her hands, felt nauseated and had a couple episodes of vomiting.  Also had a mild dull achy frontal headache.  Headache is since completely resolved.  This was not the worst headache of her life.  No neck pain or neck stiffness.  Does not have any ongoing symptoms at present.  Did not have any chest pain throughout episode or difficulty in breathing.  HPI     Past Medical History:  Diagnosis Date   Carpal tunnel syndrome    Dementia ([place])    Diabetes mellitus without complication ([place])    Dysphagia    GERD (gastroesophageal reflux disease)    Hiatal hernia    HLD (hyperlipidemia)    Hx of adenomatous colonic polyps 06/19/2014   Hyperlipidemia    Hypertension    Hypothyroidism    Onychomycosis 03/05/2012   Pain in lower limb 04/18/2013   Sciatic nerve pain    Vertigo     Patient Active Problem List   Diagnosis Date Noted   Family discord 03/28/2019   Neurocognitive deficits 01/04/2018   History of stroke 10/01/2016   Anxiety state 06/03/2016   ARI (acute respiratory infection) 01/22/2016   Uncontrolled type 2 diabetes mellitus with hyperglycemia, with long-term current use of insulin ([place]) 10/29/2015   Acute ischemic stroke ([place]) 10/29/2015   Hallucinations    Visual hallucinations 10/28/2015   Stroke-like symptoms 10/28/2015   CVA (cerebral vascular accident) ([place]) 10/28/2015   Ischemic stroke ([place])    Diabetes mellitus with complication ([place])    Hx of adenomatous colonic polyps 06/19/2014   Neuropathic pain 03/27/2014   Uncontrolled type 2 diabetes with neuropathy ([place]) 03/27/2014   Bilateral carpal tunnel syndrome  03/27/2014   AKI (acute kidney injury) ([place]) 08/05/2013   DM type 2, uncontrolled, with renal complications ([place]) 49/70/2637   Hypercalcemia 08/05/2013   PVD (peripheral vascular disease) ([place]) 03/05/2012   Onychomycosis 03/05/2012   Other hammer toe (acquired) 03/05/2012   Abnormal CXR 12/25/2011   Cystocele 06/11/2011   Incontinence 06/04/2011   Depression 06/04/2011   Hypothyroidism 06/23/2006   Dyslipidemia 06/23/2006   Essential hypertension 06/23/2006   GERD 06/23/2006   IBS 06/23/2006    Past Surgical History:  Procedure Laterality Date   BLADDER SURGERY     Mesh implant   COLONOSCOPY     LARYNX SURGERY     vocal cords   PARTIAL HYSTERECTOMY  1978   VESICOVAGINAL FISTULA CLOSURE W/ TAH  1976     OB History   No obstetric history on file.     Family History  Problem Relation Age of Onset   Alzheimer's disease Mother    Glaucoma Mother    Heart disease Father    Alzheimer's disease Father    Asthma Sister    Allergies Sister    Allergies Sister    Sleep apnea Sister    Heart disease Sister    Breast cancer Maternal Aunt    Pancreatic cancer Maternal Aunt    Prostate cancer Maternal Uncle    Prostate cancer Paternal Uncle    Alcoholism Maternal Uncle    Kidney  disease Maternal Aunt    Heart disease Maternal Aunt     Social History   Tobacco Use   Smoking status: Never   Smokeless tobacco: Never  Vaping Use   Vaping Use: Never used  Substance Use Topics   Alcohol use: No    Alcohol/week: 0.0 standard drinks   Drug use: No    Home Medications Prior to Admission medications   Medication Sig Start Date End Date Taking? Authorizing Provider  amLODipine (NORVASC) 5 MG tablet TAKE ONE TABLET BY MOUTH DAILY Patient taking differently: Take 5 mg by mouth daily.  03/11/18   Lauree Chandler, NP  ASSURE LANCE LANCETS 21G MISC 1 each by Does not apply route 4 (four) times daily. DX E11.65 01/04/18   Hendricks Limes, MD  atorvastatin (LIPITOR) 80 MG  tablet TAKE ONE TABLET BY MOUTH DAILY Patient taking differently: Take 80 mg by mouth daily at 6 PM.  03/11/18   Lauree Chandler, NP  Blood Glucose Monitoring Suppl (ONETOUCH VERIO) w/Device KIT 1 each by Does not apply route 3 (three) times daily. Use to test blood sugar three times daily. Dx: E11.21 06/15/17   Gildardo Cranker, DO  canagliflozin (INVOKANA) 100 MG TABS tablet Take 100 mg by mouth daily before breakfast.    [provider]  citalopram (CELEXA) 10 MG tablet Take 1 tablet (10 mg total) by mouth daily For anxiety in the morning 03/10/18   Lauree Chandler, NP  clopidogrel (PLAVIX) 75 MG tablet Take 1 tablet (75 mg total) by mouth daily in the morning for history of stroke 03/10/18   Lauree Chandler, NP  donepezil (ARICEPT) 10 MG tablet Take 1 tablet (10 mg total) by mouth at bedtime. For memory 03/10/18   Lauree Chandler, NP  Exenatide ER (BYDUREON BCISE) 2 MG/0.85ML AUIJ Inject 2 mg into the skin once a week.    [provider]  Glucerna (GLUCERNA) LIQD Take 237 mLs by mouth 2 (two) times daily between meals.    [provider]  glucose blood (ONETOUCH VERIO) test strip Check blood sugar three times daily DX E11.21 (One Touch Verio IQ) 06/15/17   Gildardo Cranker, DO  insulin aspart protamine - aspart (NOVOLOG MIX 70/30 FLEXPEN) (70-30) 100 UNIT/ML FlexPen 100-150=1units, 151-200=2units, 201-250=3units, 251-300=5units, 301-350=7units, greater than 350=9units Patient taking differently: Inject 30-70 Units into the skin See admin instructions. 100-150=1units, 151-200=2units, 201-250=3units, 251-300=5units, 301-350=7units, greater than 350=9units  03/11/18   Ngetich, Dinah C, NP  Insulin Pen Needle (PEN NEEDLES) 31G X 5 MM MISC 1 each by Does not apply route as needed. Use when giving insulin injections. Dx: E11.9 06/15/17   Gildardo Cranker, DO  levothyroxine (SYNTHROID) 137 MCG tablet Take 137 mcg by mouth daily before breakfast.    [provider]  magnesium  oxide (MAG-OX) 400 MG tablet Take 1 tablet (400 mg total) by mouth daily. 03/10/18   Lauree Chandler, NP  metFORMIN (GLUCOPHAGE) 1000 MG tablet TAKE TWO TABLETS BY MOUTH DAILY WITH A MEAL Patient taking differently: Take 1,000 mg by mouth 2 (two) times daily with a meal.  03/11/18   Dewaine Oats, Carlos American, NP  ondansetron (ZOFRAN) 4 MG tablet Take 4 mg by mouth every 6 (six) hours as needed for nausea or vomiting.    [provider]  pantoprazole (PROTONIX) 40 MG tablet Take 1 tablet (40 mg total) by mouth daily. 03/10/18   Lauree Chandler, NP    Allergies    Lantus [insulin glargine], Levemir [  insulin detemir], Pineapple, Codeine, Invokana [canagliflozin], Penicillins, Tradjenta [linagliptin], and Vioxx [rofecoxib]  Review of Systems   Review of Systems  Constitutional:  Negative for chills and fever.  HENT:  Negative for ear pain and sore throat.   Eyes:  Negative for pain and visual disturbance.  Respiratory:  Negative for cough and shortness of breath.   Cardiovascular:  Negative for chest pain and palpitations.  Gastrointestinal:  Positive for nausea and vomiting. Negative for abdominal pain.  Genitourinary:  Negative for dysuria and hematuria.  Musculoskeletal:  Negative for arthralgias and back pain.  Skin:  Negative for color change and rash.  Neurological:  Positive for tremors and headaches. Negative for seizures and syncope.  All other systems reviewed and are negative.  Physical Exam Updated Vital Signs BP 130/60   Pulse 73   Temp 98.4 F (36.9 C) (Oral)   Resp 16   SpO2 99%   Physical Exam Vitals and nursing note reviewed.  Constitutional:      General: She is not in acute distress.    Appearance: She is well-developed.  HENT:     Head: Normocephalic and atraumatic.  Eyes:     General: No visual field deficit.    Conjunctiva/sclera: Conjunctivae normal.  Cardiovascular:     Rate and Rhythm: Normal rate and regular rhythm.     Heart sounds: No murmur  heard. Pulmonary:     Effort: Pulmonary effort is normal. No respiratory distress.     Breath sounds: Normal breath sounds.  Abdominal:     Palpations: Abdomen is soft.     Tenderness: There is no abdominal tenderness.  Musculoskeletal:     Cervical back: Neck supple.  Skin:    General: Skin is warm and dry.  Neurological:     Mental Status: She is alert and oriented to person, place, and time.     GCS: GCS eye subscore is 4. GCS verbal subscore is 5. GCS motor subscore is 6.     Cranial Nerves: No cranial nerve deficit or facial asymmetry.     Sensory: No sensory deficit.     Motor: No weakness.     Coordination: Coordination normal.    ED Results / Procedures / Treatments   Labs (all labs ordered are listed, but only abnormal results are displayed) Labs Reviewed  COMPREHENSIVE METABOLIC PANEL - Abnormal; Notable for the following components:      Result Value   Glucose, Bld 170 (*)    GFR, Estimated 59 (*)    All other components within normal limits  CBC - Abnormal; Notable for the following components:   WBC 13.6 (*)    All other components within normal limits  URINALYSIS, ROUTINE W REFLEX MICROSCOPIC - Abnormal; Notable for the following components:   Color, Urine STRAW (*)    Glucose, UA >=500 (*)    All other components within normal limits  CBG MONITORING, ED - Abnormal; Notable for the following components:   Glucose-Capillary 166 (*)    All other components within normal limits  LIPASE, BLOOD    EKG EKG Interpretation  Date/Time:  Tuesday June 25 2020 18:12:45 EDT Ventricular Rate:  77 PR Interval:  174 QRS Duration: 68 QT Interval:  408 QTC Calculation: 461 R Axis:   13 Text Interpretation: Normal sinus rhythm Normal ECG Confirmed by Quintella Reichert [phone]) on 06/26/2020 7:31:18 PM  Radiology CT Head Wo Contrast  Result Date: 06/26/2020 CLINICAL DATA:  86 year old female with severe headache. Acute tremors, nausea and  vomiting. EXAM: CT HEAD WITHOUT  CONTRAST TECHNIQUE: Contiguous axial images were obtained from the base of the skull through the vertex without intravenous contrast. COMPARISON:  Brain MRI 10/15/2017.  Head CT 03/27/2019. FINDINGS: Brain: Cerebral volume is stable since last year and normal for age. No midline shift, ventriculomegaly, mass effect, evidence of mass lesion, intracranial hemorrhage or evidence of cortically based acute infarction. Patchy bilateral white matter hypodensity is mild to moderate for age and stable. No cortical encephalomalacia identified. Vascular: Calcified atherosclerosis at the skull base. No suspicious intracranial vascular hyperdensity. Skull: Negative. Sinuses/Orbits: Visualized paranasal sinuses and mastoids are clear. Tympanic cavities are clear. Other: Visualized orbits and scalp soft tissues are within normal limits. IMPRESSION: No acute intracranial abnormality. Stable CT appearance of the brain since last year with mild to moderate for age cerebral white matter changes most commonly due to small vessel disease. Electronically Signed   By: Genevie Ann M.D.   On: 06/26/2020 06:23    Procedures Procedures   Medications Ordered in ED Medications - No data to display   ED Course  I have reviewed the triage vital signs and the nursing notes.  Pertinent labs & imaging results that were available during my care of the patient were reviewed by me and considered in my medical decision making (see chart for details).    MDM Rules/Calculators/A&P                          86 year old lady presented to ER after having an episode of feeling unwell, headache, nausea and tremors.  When I evaluated patient, she had no ongoing symptoms.  Normal neurologic exam.  CT head was negative.  Her basic labs are stable.  Given lack of ongoing symptoms and reassuring work-up, believe she can be discharged and managed in the outpatient setting.  Recommend she follow-up with her primary doctor regarding this  episode.    After the discussed management above, the patient was determined to be safe for discharge.  The patient was in agreement with this plan and all questions regarding their care were answered.  ED return precautions were discussed and the patient will return to the ED with any significant worsening of condition.  Final Clinical Impression(s) / ED Diagnoses Final diagnoses:  Nonintractable headache, unspecified chronicity pattern, unspecified headache type    Rx / DC Orders ED Discharge Orders     None        Lucrezia Starch, MD 06/27/20 [phone]

## 2020-09-19 ENCOUNTER — Other Ambulatory Visit: Payer: Self-pay

## 2020-09-19 ENCOUNTER — Encounter: Payer: Self-pay | Admitting: Emergency Medicine

## 2020-09-19 ENCOUNTER — Ambulatory Visit
Admission: EM | Admit: 2020-09-19 | Discharge: 2020-09-19 | Disposition: A | Discharge status: Home / Self Care | Payer: Medicare (Managed Care) | Attending: Urgent Care | Admitting: Urgent Care

## 2020-09-19 DIAGNOSIS — E119 Type 2 diabetes mellitus without complications: Secondary | ICD-10-CM

## 2020-09-19 DIAGNOSIS — R5383 Other fatigue: Secondary | ICD-10-CM | POA: Diagnosis not present

## 2020-09-19 DIAGNOSIS — Z794 Long term (current) use of insulin: Secondary | ICD-10-CM

## 2020-09-19 DIAGNOSIS — Z20822 Contact with and (suspected) exposure to covid-19: Secondary | ICD-10-CM

## 2020-09-19 DIAGNOSIS — R0789 Other chest pain: Secondary | ICD-10-CM

## 2020-09-19 LAB — POCT FASTING CBG KUC MANUAL ENTRY: POCT Glucose (KUC): 243 mg/dL — AB (ref 70–99)

## 2020-09-19 NOTE — Discharge Instructions (Addendum)
We will notify you of your COVID-19 test results as they arrive and may take between 48-72 hours.  I encourage you to sign up for MyChart if you have not already done so as this can be the easiest way for [place] to communicate results to you online or through a phone app.  Generally, we only contact you if it is a positive COVID result.  In the meantime, if you develop worsening symptoms including fever, chest pain, shortness of breath despite our current treatment plan then please report to the emergency room as this may be a sign of worsening status from possible COVID-19 infection.  Otherwise, we will manage this as a viral syndrome. For sore throat or cough try using a honey-based tea. Use 3 teaspoons of honey with juice squeezed from half lemon. Place shaved pieces of ginger into 1/2-1 cup of water and warm over stove top. Then mix the ingredients and repeat every 4 hours as needed. Please take Tylenol '500mg'$ -'650mg'$  every 6 hours for aches and pains, fevers. Hydrate very well with at least 2 liters of water. Eat light meals such as soups to replenish electrolytes and soft fruits, veggies. Start an antihistamine like Zyrtec, Allegra or Claritin for postnasal drainage, sinus congestion.

## 2020-09-19 NOTE — ED Provider Notes (Signed)
[place]   MRN: [id number] DOB: 1934/11/22  Subjective:   Allison [name] is a 86 y.o. female presenting for several day history of midsternal chest discomfort, fatigue, nausea.  Patient has uncontrolled diabetes type 2, treated with insulin.  She had COVID exposure.  Would like to be checked for this.  She is COVID vaccinated but no booster.  Has a history of heart disease, stroke, GERD.  No shortness of breath or wheezing.  No current facility-administered medications for this encounter.  Current Outpatient Medications:    amLODipine (NORVASC) 5 MG tablet, TAKE ONE TABLET BY MOUTH DAILY (Patient taking differently: Take 5 mg by mouth daily.), Disp: 30 tablet, Rfl: 0   ASSURE LANCE LANCETS 21G MISC, 1 each by Does not apply route 4 (four) times daily. DX E11.65, Disp: 200 each, Rfl: 11   atorvastatin (LIPITOR) 80 MG tablet, TAKE ONE TABLET BY MOUTH DAILY (Patient taking differently: Take 80 mg by mouth daily at 6 PM.), Disp: 30 tablet, Rfl: 0   Blood Glucose Monitoring Suppl (ONETOUCH VERIO) w/Device KIT, 1 each by Does not apply route 3 (three) times daily. Use to test blood sugar three times daily. Dx: E11.21, Disp: 1 kit, Rfl: 0   canagliflozin (INVOKANA) 100 MG TABS tablet, Take 100 mg by mouth daily before breakfast., Disp: , Rfl:    citalopram (CELEXA) 10 MG tablet, Take 1 tablet (10 mg total) by mouth daily For anxiety in the morning, Disp: 30 tablet, Rfl: 3   clopidogrel (PLAVIX) 75 MG tablet, Take 1 tablet (75 mg total) by mouth daily in the morning for history of stroke, Disp: 30 tablet, Rfl: 3   donepezil (ARICEPT) 10 MG tablet, Take 1 tablet (10 mg total) by mouth at bedtime. For memory, Disp: 30 tablet, Rfl: 3   Exenatide ER (BYDUREON BCISE) 2 MG/0.85ML AUIJ, Inject 2 mg into the skin once a week., Disp: , Rfl:    Glucerna (GLUCERNA) LIQD, Take 237 mLs by mouth 2 (two) times daily between meals., Disp: , Rfl:    glucose blood (ONETOUCH VERIO) test strip, Check  blood sugar three times daily DX E11.21 (One Touch Verio IQ), Disp: 200 each, Rfl: 11   insulin aspart protamine - aspart (NOVOLOG MIX 70/30 FLEXPEN) (70-30) 100 UNIT/ML FlexPen, 100-150=1units, 151-200=2units, 201-250=3units, 251-300=5units, 301-350=7units, greater than 350=9units (Patient taking differently: Inject 30-70 Units into the skin See admin instructions. 100-150=1units, 151-200=2units, 201-250=3units, 251-300=5units, 301-350=7units, greater than 350=9units), Disp: 5 pen, Rfl: 1   Insulin Pen Needle (PEN NEEDLES) 31G X 5 MM MISC, 1 each by Does not apply route as needed. Use when giving insulin injections. Dx: E11.9, Disp: 200 each, Rfl: 3   levothyroxine (SYNTHROID) 137 MCG tablet, Take 137 mcg by mouth daily before breakfast., Disp: , Rfl:    magnesium oxide (MAG-OX) 400 MG tablet, Take 1 tablet (400 mg total) by mouth daily., Disp: 30 tablet, Rfl: 3   metFORMIN (GLUCOPHAGE) 1000 MG tablet, TAKE TWO TABLETS BY MOUTH DAILY WITH A MEAL (Patient taking differently: Take 1,000 mg by mouth 2 (two) times daily with a meal.), Disp: 60 tablet, Rfl: 0   ondansetron (ZOFRAN) 4 MG tablet, Take 4 mg by mouth every 6 (six) hours as needed for nausea or vomiting., Disp: , Rfl:    pantoprazole (PROTONIX) 40 MG tablet, Take 1 tablet (40 mg total) by mouth daily., Disp: 30 tablet, Rfl: 2   Allergies  Allergen Reactions   Lantus [Insulin Glargine] Itching and Swelling    angioedema  Levemir [Insulin Detemir] Itching and Swelling    angioedema   Pineapple Anaphylaxis   Codeine Other (See Comments)    unknown   Invokana [Canagliflozin] Other (See Comments)    Constipation, nightmares   Penicillins Other (See Comments)    Unknown    Tradjenta [Linagliptin] Other (See Comments)    Unknown   Vioxx [Rofecoxib] Other (See Comments)    unknown    Past Medical History:  Diagnosis Date   Carpal tunnel syndrome    Dementia ([place])    Diabetes mellitus without complication (HCC)    Dysphagia    GERD  (gastroesophageal reflux disease)    Hiatal hernia    HLD (hyperlipidemia)    Hx of adenomatous colonic polyps 06/19/2014   Hyperlipidemia    Hypertension    Hypothyroidism    Onychomycosis 03/05/2012   Pain in lower limb 04/18/2013   Sciatic nerve pain    Vertigo      Past Surgical History:  Procedure Laterality Date   BLADDER SURGERY     Mesh implant   COLONOSCOPY     LARYNX SURGERY     vocal cords   PARTIAL HYSTERECTOMY  1978   VESICOVAGINAL FISTULA CLOSURE W/ TAH  1976    Family History  Problem Relation Age of Onset   Alzheimer's disease Mother    Glaucoma Mother    Heart disease Father    Alzheimer's disease Father    Asthma Sister    Allergies Sister    Allergies Sister    Sleep apnea Sister    Heart disease Sister    Breast cancer Maternal Aunt    Pancreatic cancer Maternal Aunt    Prostate cancer Maternal Uncle    Prostate cancer Paternal Uncle    Alcoholism Maternal Uncle    Kidney disease Maternal Aunt    Heart disease Maternal Aunt     Social History   Tobacco Use   Smoking status: Never   Smokeless tobacco: Never  Vaping Use   Vaping Use: Never used  Substance Use Topics   Alcohol use: No    Alcohol/week: 0.0 standard drinks   Drug use: No    ROS   Objective:   Vitals: BP 138/84 (BP Location: Left Arm)   Pulse 95   Temp 97.9 F (36.6 C) (Oral)   SpO2 98%   Physical Exam Constitutional:      General: She is not in acute distress.    Appearance: Normal appearance. She is well-developed and normal weight. She is not ill-appearing, toxic-appearing or diaphoretic.  HENT:     Head: Normocephalic and atraumatic.     Right Ear: Tympanic membrane, ear canal and external ear normal. No drainage or tenderness. No middle ear effusion. Tympanic membrane is not erythematous.     Left Ear: Tympanic membrane, ear canal and external ear normal. No drainage or tenderness.  No middle ear effusion. Tympanic membrane is not erythematous.     Nose: Nose  normal. No congestion or rhinorrhea.     Mouth/Throat:     Mouth: Mucous membranes are moist. No oral lesions.     Pharynx: Oropharynx is clear. No pharyngeal swelling, oropharyngeal exudate, posterior oropharyngeal erythema or uvula swelling.     Tonsils: No tonsillar exudate or tonsillar abscesses.  Eyes:     General: No scleral icterus.    Extraocular Movements: Extraocular movements intact.     Right eye: Normal extraocular motion.     Left eye: Normal extraocular motion.  Conjunctiva/sclera: Conjunctivae normal.     Pupils: Pupils are equal, round, and reactive to light.  Cardiovascular:     Rate and Rhythm: Normal rate and regular rhythm.     Pulses: Normal pulses.     Heart sounds: Normal heart sounds. No murmur heard.   No friction rub. No gallop.  Pulmonary:     Effort: Pulmonary effort is normal. No respiratory distress.     Breath sounds: Normal breath sounds. No stridor. No wheezing, rhonchi or rales.  Abdominal:     General: Bowel sounds are normal. There is no distension.     Palpations: Abdomen is soft. There is no mass.     Tenderness: There is no abdominal tenderness. There is no right CVA tenderness, left CVA tenderness, guarding or rebound.  Musculoskeletal:     Cervical back: Normal range of motion and neck supple.  Lymphadenopathy:     Cervical: No cervical adenopathy.  Skin:    General: Skin is warm and dry.     Coloration: Skin is not pale.     Findings: No rash.  Neurological:     General: No focal deficit present.     Mental Status: She is alert and oriented to person, place, and time.  Psychiatric:        Mood and Affect: Mood normal.        Behavior: Behavior normal.        Thought Content: Thought content normal.        Judgment: Judgment normal.    Results for orders placed or performed during the hospital encounter of 09/19/20 (from the past 24 hour(s))  POCT CBG (manual entry)     Status: Abnormal   Collection Time: 09/19/20  8:08 PM   Result Value Ref Range   POCT Glucose ([facility]) 243 (A) 70 - 99 mg/dL     ED ECG REPORT   Date: 09/19/2020  EKG Time: 7:58 PM  Rate: 88bpm  Rhythm: normal sinus rhythm,  normal sinus rhythm  Axis: Normal  Intervals:none  ST&T Change: None  Narrative Interpretation: Sinus rhythm at 88 bpm, no acute findings.    Assessment and Plan :   PDMP not reviewed this encounter.  1. Other fatigue   2. Exposure to COVID-19 virus   3. Atypical chest pain   4. Type 2 diabetes mellitus treated with insulin (HCC)     The patient test positive for COVID-19, she would be a good candidate for COVID antivirals with the renal dosing using Paxlovid.  Otherwise recommend follow-up with PCP for recheck on her diabetes and symptoms.  Deferred imaging given clear cardiopulmonary exam, hemodynamically stable vital signs. Counseled patient on potential for adverse effects with medications prescribed/recommended today, ER and return-to-clinic precautions discussed, patient verbalized understanding.    Jaynee Eagles, PA-C 09/19/20 2008

## 2020-09-19 NOTE — ED Triage Notes (Signed)
Patient states she has been exposed to [place] from her neighbor who tested positive for COVID.  Patient is having nausea and fatigue.  Patient is vaccinated for COVID.

## 2020-09-21 LAB — SARS-COV-2, NAA 2 DAY TAT

## 2020-09-21 LAB — NOVEL CORONAVIRUS, NAA: SARS-CoV-2, NAA: NOT DETECTED

## 2021-01-07 ENCOUNTER — Encounter (HOSPITAL_COMMUNITY): Payer: Self-pay | Admitting: Emergency Medicine

## 2021-01-07 ENCOUNTER — Observation Stay (HOSPITAL_COMMUNITY)
Admission: EM | Admit: 2021-01-07 | Discharge: 2021-01-08 | Disposition: A | Discharge status: Home / Self Care | Payer: Medicare (Managed Care) | Attending: Student | Admitting: Student

## 2021-01-07 ENCOUNTER — Emergency Department (HOSPITAL_COMMUNITY): Payer: Medicare (Managed Care)

## 2021-01-07 ENCOUNTER — Other Ambulatory Visit: Payer: Self-pay

## 2021-01-07 DIAGNOSIS — R2681 Unsteadiness on feet: Secondary | ICD-10-CM | POA: Insufficient documentation

## 2021-01-07 DIAGNOSIS — E1165 Type 2 diabetes mellitus with hyperglycemia: Principal | ICD-10-CM | POA: Diagnosis present

## 2021-01-07 DIAGNOSIS — E039 Hypothyroidism, unspecified: Secondary | ICD-10-CM | POA: Diagnosis present

## 2021-01-07 DIAGNOSIS — I10 Essential (primary) hypertension: Secondary | ICD-10-CM | POA: Diagnosis present

## 2021-01-07 DIAGNOSIS — Z7984 Long term (current) use of oral hypoglycemic drugs: Secondary | ICD-10-CM | POA: Insufficient documentation

## 2021-01-07 DIAGNOSIS — Z79899 Other long term (current) drug therapy: Secondary | ICD-10-CM | POA: Diagnosis not present

## 2021-01-07 DIAGNOSIS — Z794 Long term (current) use of insulin: Secondary | ICD-10-CM | POA: Insufficient documentation

## 2021-01-07 DIAGNOSIS — E118 Type 2 diabetes mellitus with unspecified complications: Secondary | ICD-10-CM

## 2021-01-07 DIAGNOSIS — U071 COVID-19: Secondary | ICD-10-CM | POA: Diagnosis not present

## 2021-01-07 DIAGNOSIS — F0390 Unspecified dementia without behavioral disturbance: Secondary | ICD-10-CM | POA: Insufficient documentation

## 2021-01-07 DIAGNOSIS — J189 Pneumonia, unspecified organism: Secondary | ICD-10-CM | POA: Diagnosis present

## 2021-01-07 DIAGNOSIS — K219 Gastro-esophageal reflux disease without esophagitis: Secondary | ICD-10-CM | POA: Diagnosis present

## 2021-01-07 DIAGNOSIS — Z8673 Personal history of transient ischemic attack (TIA), and cerebral infarction without residual deficits: Secondary | ICD-10-CM

## 2021-01-07 DIAGNOSIS — R739 Hyperglycemia, unspecified: Secondary | ICD-10-CM

## 2021-01-07 LAB — CBC
HCT: 34.5 % — ABNORMAL LOW (ref 36.0–46.0)
Hemoglobin: 11.6 g/dL — ABNORMAL LOW (ref 12.0–15.0)
MCH: 27.3 pg (ref 26.0–34.0)
MCHC: 33.6 g/dL (ref 30.0–36.0)
MCV: 81.2 fL (ref 80.0–100.0)
Platelets: 179 10*3/uL (ref 150–400)
RBC: 4.25 MIL/uL (ref 3.87–5.11)
RDW: 14 % (ref 11.5–15.5)
WBC: 7.8 10*3/uL (ref 4.0–10.5)
nRBC: 0 % (ref 0.0–0.2)

## 2021-01-07 LAB — URINALYSIS, ROUTINE W REFLEX MICROSCOPIC
Bacteria, UA: NONE SEEN
Bilirubin Urine: NEGATIVE
Glucose, UA: >=500 mg/dL — AB
Hgb urine dipstick: NEGATIVE
Ketones, ur: NEGATIVE mg/dL
Nitrite: NEGATIVE
Protein, ur: NEGATIVE mg/dL
Specific Gravity, Urine: 1.022 (ref 1.005–1.030)
pH: 7 (ref 5.0–8.0)

## 2021-01-07 LAB — CBG MONITORING, ED
Glucose-Capillary: 520 mg/dL (ref 70–99)
Glucose-Capillary: 468 mg/dL — ABNORMAL HIGH (ref 70–99)

## 2021-01-07 LAB — BASIC METABOLIC PANEL
Anion gap: 8 (ref 5–15)
BUN: 17 mg/dL (ref 8–23)
CO2: 24 mmol/L (ref 22–32)
Calcium: 8.6 mg/dL — ABNORMAL LOW (ref 8.9–10.3)
Chloride: 102 mmol/L (ref 98–111)
Creatinine, Ser: 0.82 mg/dL (ref 0.44–1.00)
GFR, Estimated: >60 mL/min (ref >60)
Glucose, Bld: 503 mg/dL (ref 70–99)
Potassium: 3.5 mmol/L (ref 3.5–5.1)
Sodium: 134 mmol/L — ABNORMAL LOW (ref 135–145)

## 2021-01-07 MED ORDER — LACTATED RINGERS IV BOLUS
1000.0000 mL | Freq: Once | INTRAVENOUS | Status: AC
  Administered 2021-01-07: 22:00:00 1000 mL via INTRAVENOUS

## 2021-01-07 MED ORDER — SODIUM CHLORIDE 0.9 % IV SOLN
1.0000 g | Freq: Once | INTRAVENOUS | Status: AC
  Administered 2021-01-07: 23:00:00 1 g via INTRAVENOUS
  Filled 2021-01-07: qty 10

## 2021-01-07 MED ORDER — ACETAMINOPHEN 325 MG PO TABS: 650.0000 mg | ORAL_TABLET | Freq: Once | ORAL | Status: DC

## 2021-01-07 MED ORDER — SODIUM CHLORIDE 0.9 % IV SOLN
500.0000 mg | Freq: Once | INTRAVENOUS | Status: AC
  Administered 2021-01-08: 500 mg via INTRAVENOUS
  Filled 2021-01-07: qty 5

## 2021-01-07 MED ORDER — INSULIN ASPART 100 UNIT/ML IJ SOLN
10.0000 [IU] | Freq: Once | INTRAMUSCULAR | Status: AC
  Administered 2021-01-07: 10 [IU] via INTRAVENOUS
  Filled 2021-01-07: qty 0.1

## 2021-01-07 NOTE — ED Provider Notes (Signed)
[facility] DEPT Provider Note   CSN: [id number] Arrival date & time: 01/07/21  2111     History Chief Complaint  Patient presents with   Covid Positive   Hyperglycemia         DANICIA TERHAAR is a 86 y.o. female.   Hyperglycemia Associated symptoms: confusion, fatigue, shortness of breath and weakness (Generalized)   Associated symptoms: no abdominal pain, no chest pain, no dizziness, no dysuria, no fever, no nausea and no vomiting   Per EMS, patient presents for cough.  She has a history of dementia and lives at home with her sister. She reportedly had a recent diagnosis of COVID-19.  EMS arrived and checked her blood sugar.  She had an incidental finding of hyperglycemia.  Glucometer read "high".  400 cc of IV fluids were given prior to arrival.  On arrival, patient denies any current symptoms.  History per sister: Patient has had symptoms of cough, fatigue, decreased p.o. intake, generalized weakness, falls, increased work of breathing, and acute on chronic confusion over the past 7 days.  5 days ago, she was diagnosed with COVID-19.  Over the past several days, she has had worsening of symptoms.  Her medications have been managed by her son, who also lives with her at home.  Patient takes oral medication for diabetes but does get weekly injections of insulin.  She did not get her injection this week due to feeling unwell.    Past Medical History:  Diagnosis Date   Acute ischemic stroke ([place]) 10/29/2015   Carpal tunnel syndrome    Dementia ([place])    Diabetes mellitus without complication ([place])    Dysphagia    GERD (gastroesophageal reflux disease)    Hiatal hernia    HLD (hyperlipidemia)    Hx of adenomatous colonic polyps 06/19/2014   Hyperlipidemia    Hypertension    Hypothyroidism    Onychomycosis 03/05/2012   Pain in lower limb 04/18/2013   Sciatic nerve pain    Vertigo     Patient Active Problem List   Diagnosis Date Noted    Pneumonia 01/07/2021   Family discord 03/28/2019   Neurocognitive deficits 01/04/2018   History of stroke 10/01/2016   Anxiety state 06/03/2016   Uncontrolled type 2 diabetes mellitus with hyperglycemia, with long-term current use of insulin ([place]) 10/29/2015   Visual hallucinations 10/28/2015   Stroke-like symptoms 10/28/2015   CVA (cerebral vascular accident) ([place]) 10/28/2015   Ischemic stroke ([place])    Diabetes mellitus with complication ([place])    Hx of adenomatous colonic polyps 06/19/2014   Neuropathic pain 03/27/2014   Uncontrolled type 2 diabetes with neuropathy 03/27/2014   Bilateral carpal tunnel syndrome 03/27/2014   Diabetes mellitus with hyperglycemia ([place]) 08/05/2013   Hypercalcemia 08/05/2013   PVD (peripheral vascular disease) ([place]) 03/05/2012   Onychomycosis 03/05/2012   Other hammer toe (acquired) 03/05/2012   Abnormal CXR 12/25/2011   Cystocele 06/11/2011   Incontinence 06/04/2011   Depression 06/04/2011   Hypothyroidism 06/23/2006   Dyslipidemia 06/23/2006   Essential hypertension 06/23/2006   GERD 06/23/2006   IBS 06/23/2006    Past Surgical History:  Procedure Laterality Date   BLADDER SURGERY     Mesh implant   COLONOSCOPY     LARYNX SURGERY     vocal cords   PARTIAL HYSTERECTOMY  1978   VESICOVAGINAL FISTULA CLOSURE W/ TAH  1976     OB History   No obstetric history on file.     Family  History  Problem Relation Age of Onset   Alzheimer's disease Mother    Glaucoma Mother    Heart disease Father    Alzheimer's disease Father    Asthma Sister    Allergies Sister    Allergies Sister    Sleep apnea Sister    Heart disease Sister    Breast cancer Maternal Aunt    Pancreatic cancer Maternal Aunt    Prostate cancer Maternal Uncle    Prostate cancer Paternal Uncle    Alcoholism Maternal Uncle    Kidney disease Maternal Aunt    Heart disease Maternal Aunt     Social History   Tobacco Use   Smoking status: Never   Smokeless tobacco:  Never  Vaping Use   Vaping Use: Never used  Substance Use Topics   Alcohol use: No    Alcohol/week: 0.0 standard drinks   Drug use: No    Home Medications Prior to Admission medications   Medication Sig Start Date End Date Taking? Authorizing Provider  amLODipine (NORVASC) 5 MG tablet TAKE ONE TABLET BY MOUTH DAILY Patient taking differently: Take 5 mg by mouth daily. 03/11/18   Lauree Chandler, NP  ASSURE LANCE LANCETS 21G MISC 1 each by Does not apply route 4 (four) times daily. DX E11.65 01/04/18   Hendricks Limes, MD  atorvastatin (LIPITOR) 80 MG tablet TAKE ONE TABLET BY MOUTH DAILY Patient taking differently: Take 80 mg by mouth daily at 6 PM. 03/11/18   Lauree Chandler, NP  Blood Glucose Monitoring Suppl (ONETOUCH VERIO) w/Device KIT 1 each by Does not apply route 3 (three) times daily. Use to test blood sugar three times daily. Dx: E11.21 06/15/17   Gildardo Cranker, DO  canagliflozin (INVOKANA) 100 MG TABS tablet Take 100 mg by mouth daily before breakfast.    [provider]  citalopram (CELEXA) 10 MG tablet Take 1 tablet (10 mg total) by mouth daily For anxiety in the morning 03/10/18   Lauree Chandler, NP  clopidogrel (PLAVIX) 75 MG tablet Take 1 tablet (75 mg total) by mouth daily in the morning for history of stroke 03/10/18   Lauree Chandler, NP  donepezil (ARICEPT) 10 MG tablet Take 1 tablet (10 mg total) by mouth at bedtime. For memory 03/10/18   Lauree Chandler, NP  Exenatide ER (BYDUREON BCISE) 2 MG/0.85ML AUIJ Inject 2 mg into the skin once a week.    [provider]  Glucerna (GLUCERNA) LIQD Take 237 mLs by mouth 2 (two) times daily between meals.    [provider]  glucose blood (ONETOUCH VERIO) test strip Check blood sugar three times daily DX E11.21 (One Touch Verio IQ) 06/15/17   Gildardo Cranker, DO  insulin aspart protamine - aspart (NOVOLOG MIX 70/30 FLEXPEN) (70-30) 100 UNIT/ML FlexPen 100-150=1units, 151-200=2units,  201-250=3units, 251-300=5units, 301-350=7units, greater than 350=9units Patient taking differently: Inject 30-70 Units into the skin See admin instructions. 100-150=1units, 151-200=2units, 201-250=3units, 251-300=5units, 301-350=7units, greater than 350=9units 03/11/18   Ngetich, Dinah C, NP  Insulin Pen Needle (PEN NEEDLES) 31G X 5 MM MISC 1 each by Does not apply route as needed. Use when giving insulin injections. Dx: E11.9 06/15/17   Gildardo Cranker, DO  levothyroxine (SYNTHROID) 137 MCG tablet Take 137 mcg by mouth daily before breakfast.    [provider]  magnesium oxide (MAG-OX) 400 MG tablet Take 1 tablet (400 mg total) by mouth daily. 03/10/18   Lauree Chandler, NP  metFORMIN (GLUCOPHAGE) 1000 MG tablet TAKE  TWO TABLETS BY MOUTH DAILY WITH A MEAL Patient taking differently: Take 1,000 mg by mouth 2 (two) times daily with a meal. 03/11/18   Eubanks, Carlos American, NP  ondansetron (ZOFRAN) 4 MG tablet Take 4 mg by mouth every 6 (six) hours as needed for nausea or vomiting.    [provider]  pantoprazole (PROTONIX) 40 MG tablet Take 1 tablet (40 mg total) by mouth daily. 03/10/18   Lauree Chandler, NP    Allergies    Lantus [insulin glargine], Levemir [insulin detemir], Pineapple, Codeine, Invokana [canagliflozin], Penicillins, Tradjenta [linagliptin], and Vioxx [rofecoxib]  Review of Systems   Review of Systems  Constitutional:  Positive for activity change, appetite change and fatigue. Negative for fever.  HENT:  Positive for congestion and sore throat. Negative for ear pain and trouble swallowing.   Eyes:  Negative for pain and visual disturbance.  Respiratory:  Positive for cough and shortness of breath.   Cardiovascular:  Negative for chest pain and palpitations.  Gastrointestinal:  Negative for abdominal pain, diarrhea, nausea and vomiting.  Genitourinary:  Negative for dysuria, flank pain and hematuria.  Musculoskeletal:  Negative for arthralgias, back pain,  myalgias and neck pain.  Skin:  Negative for color change and rash.  Neurological:  Positive for weakness (Generalized). Negative for dizziness, seizures, syncope, speech difficulty, light-headedness, numbness and headaches.  Hematological:  Does not bruise/bleed easily.  Psychiatric/Behavioral:  Positive for confusion.   All other systems reviewed and are negative.  Physical Exam Updated Vital Signs BP 135/81    Pulse 72    Temp (P) 98.7 F (37.1 C) (Oral)    Resp 16    Ht '5\' 4"'  (1.626 m)    Wt 54.9 kg    SpO2 98%    BMI 20.77 kg/m   Physical Exam Vitals and nursing note reviewed.  Constitutional:      General: She is not in acute distress.    Appearance: Normal appearance. She is well-developed. She is not ill-appearing, toxic-appearing or diaphoretic.  HENT:     Head: Normocephalic and atraumatic.     Right Ear: External ear normal.     Left Ear: External ear normal.     Nose: Nose normal. No congestion.     Mouth/Throat:     Mouth: Mucous membranes are dry.     Pharynx: Oropharynx is clear.  Eyes:     General: No scleral icterus.    Conjunctiva/sclera: Conjunctivae normal.  Cardiovascular:     Rate and Rhythm: Normal rate and regular rhythm.     Heart sounds: No murmur heard. Pulmonary:     Effort: Pulmonary effort is normal. No respiratory distress.     Breath sounds: Normal breath sounds. No wheezing or rales.  Chest:     Chest wall: No tenderness.  Abdominal:     General: Abdomen is flat.     Palpations: Abdomen is soft.     Tenderness: There is no abdominal tenderness.  Musculoskeletal:        General: No swelling or deformity.     Cervical back: Normal range of motion and neck supple. No rigidity.     Right lower leg: No edema.     Left lower leg: No edema.  Skin:    General: Skin is warm and dry.     Capillary Refill: Capillary refill takes less than 2 seconds.     Coloration: Skin is not jaundiced or pale.  Neurological:     General: No focal deficit  present.     Mental Status: She is alert.     Cranial Nerves: Cranial nerves 2-12 are intact. No dysarthria or facial asymmetry.     Sensory: Sensation is intact. No sensory deficit.     Motor: Motor function is intact.  Psychiatric:        Mood and Affect: Mood normal.        Behavior: Behavior normal.    ED Results / Procedures / Treatments   Labs (all labs ordered are listed, but only abnormal results are displayed) Labs Reviewed  BASIC METABOLIC PANEL - Abnormal; Notable for the following components:      Result Value   Sodium 134 (*)    Glucose, Bld 503 (*)    Calcium 8.6 (*)    All other components within normal limits  CBC - Abnormal; Notable for the following components:   Hemoglobin 11.6 (*)    HCT 34.5 (*)    All other components within normal limits  URINALYSIS, ROUTINE W REFLEX MICROSCOPIC - Abnormal; Notable for the following components:   Color, Urine STRAW (*)    Glucose, UA >=500 (*)    Leukocytes,Ua SMALL (*)    All other components within normal limits  CBG MONITORING, ED - Abnormal; Notable for the following components:   Glucose-Capillary 520 (*)    All other components within normal limits  CBG MONITORING, ED - Abnormal; Notable for the following components:   Glucose-Capillary 468 (*)    All other components within normal limits  CBG MONITORING, ED - Abnormal; Notable for the following components:   Glucose-Capillary 253 (*)    All other components within normal limits  RESP PANEL BY RT-PCR (FLU A&B, COVID) [id number]    EKG None  Radiology DG Chest Portable 1 View  Result Date: 01/07/2021 CLINICAL DATA:  Cough and fever EXAM: PORTABLE CHEST 1 VIEW COMPARISON:  03/22/2019 FINDINGS: Airspace disease at the left lung base with possible small effusion. Stable cardiomediastinal silhouette with aortic atherosclerosis. No pneumothorax. IMPRESSION: Airspace disease at left lung base concerning for a pneumonia. Possible small left effusion. Radiographic  follow-up to resolution is recommended Electronically Signed   By: Donavan Foil M.D.   On: 01/07/2021 22:09    Procedures Procedures   Medications Ordered in ED Medications  lactated ringers bolus 1,000 mL (0 mLs Intravenous Stopped 01/07/21 2324)  cefTRIAXone (ROCEPHIN) 1 g in sodium chloride 0.9 % 100 mL IVPB (0 g Intravenous Stopped 01/08/21 0000)  azithromycin (ZITHROMAX) 500 mg in sodium chloride 0.9 % 250 mL IVPB (0 mg Intravenous Stopped 01/08/21 0211)  insulin aspart (novoLOG) injection 10 Units (10 Units Intravenous Given 01/07/21 2357)    ED Course  I have reviewed the triage vital signs and the nursing notes.  Pertinent labs & imaging results that were available during my care of the patient were reviewed by me and considered in my medical decision making (see chart for details).    MDM Rules/Calculators/A&P                         Patient is 86 year old female presenting for cough, shortness of breath, generalized weakness, increased falls, fatigue, decreased appetite, and acute on chronic confusion.  EMS reported fever prior to arrival.  On arrival, she is afebrile.  Vital signs are notable for marked hypertension.  Patient denies any current complaints.  Given her hyperglycemia, IV fluids were initiated.  Initial glucose was 503 without evidence of DKA.  Chest x-ray  showed concern for left lower lobe pneumonia.  Patient was treated empirically for treated for pneumonia.  Patient was admitted to hospitalist for further management.  Final Clinical Impression(s) / ED Diagnoses Final diagnoses:  Hyperglycemia  Community acquired pneumonia of left lower lobe of lung    Rx / DC Orders ED Discharge Orders     None        Godfrey Pick, MD 01/08/21 0320

## 2021-01-07 NOTE — Assessment & Plan Note (Signed)
Assign to observation. IV rocephin and po zithromax. Check procalcitonin. On RA. No leukocytosis. May be able to go home in 24hours.

## 2021-01-07 NOTE — Assessment & Plan Note (Signed)
Chronic. 

## 2021-01-07 NOTE — Assessment & Plan Note (Signed)
Stable

## 2021-01-07 NOTE — Assessment & Plan Note (Signed)
-   Continue home meds °

## 2021-01-07 NOTE — Assessment & Plan Note (Signed)
Serum glucose greater than 500.  We will give 1 dose of IV insulin.  Continue insulin here.  Check A1c.

## 2021-01-07 NOTE — Assessment & Plan Note (Signed)
Continue PPI ?

## 2021-01-07 NOTE — ED Triage Notes (Incomplete)
Pt arrived via EMS from home. Pt has hx of dementia, but lives at home with her sister. Pt was diagnosed with covid on 01/03/21. Pt has been non-compliant with her covid medicine and her insulin. Pt has hx of diabetes. Pt is complaining of congestion. Pt's CBG read high for EMS.

## 2021-01-08 DIAGNOSIS — Z794 Long term (current) use of insulin: Secondary | ICD-10-CM

## 2021-01-08 DIAGNOSIS — E1165 Type 2 diabetes mellitus with hyperglycemia: Secondary | ICD-10-CM

## 2021-01-08 DIAGNOSIS — R9389 Abnormal findings on diagnostic imaging of other specified body structures: Secondary | ICD-10-CM

## 2021-01-08 DIAGNOSIS — K219 Gastro-esophageal reflux disease without esophagitis: Secondary | ICD-10-CM

## 2021-01-08 DIAGNOSIS — E039 Hypothyroidism, unspecified: Secondary | ICD-10-CM

## 2021-01-08 DIAGNOSIS — U071 COVID-19: Secondary | ICD-10-CM

## 2021-01-08 DIAGNOSIS — J189 Pneumonia, unspecified organism: Secondary | ICD-10-CM

## 2021-01-08 DIAGNOSIS — Z8673 Personal history of transient ischemic attack (TIA), and cerebral infarction without residual deficits: Secondary | ICD-10-CM

## 2021-01-08 DIAGNOSIS — I10 Essential (primary) hypertension: Secondary | ICD-10-CM

## 2021-01-08 LAB — CBC WITH DIFFERENTIAL/PLATELET
Abs Immature Granulocytes: 0.05 10*3/uL (ref 0.00–0.07)
Basophils Absolute: 0 10*3/uL (ref 0.0–0.1)
Basophils Relative: 0 %
Eosinophils Absolute: 0.1 10*3/uL (ref 0.0–0.5)
Eosinophils Relative: 1 %
HCT: 35.5 % — ABNORMAL LOW (ref 36.0–46.0)
Hemoglobin: 12.3 g/dL (ref 12.0–15.0)
Immature Granulocytes: 1 %
Lymphocytes Relative: 29 %
Lymphs Abs: 2.8 10*3/uL (ref 0.7–4.0)
MCH: 27.7 pg (ref 26.0–34.0)
MCHC: 34.6 g/dL (ref 30.0–36.0)
MCV: 80 fL (ref 80.0–100.0)
Monocytes Absolute: 1.1 10*3/uL — ABNORMAL HIGH (ref 0.1–1.0)
Monocytes Relative: 12 %
Neutro Abs: 5.5 10*3/uL (ref 1.7–7.7)
Neutrophils Relative %: 57 %
Platelets: 177 10*3/uL (ref 150–400)
RBC: 4.44 MIL/uL (ref 3.87–5.11)
RDW: 13.9 % (ref 11.5–15.5)
WBC: 9.5 10*3/uL (ref 4.0–10.5)
nRBC: 0 % (ref 0.0–0.2)

## 2021-01-08 LAB — RESP PANEL BY RT-PCR (FLU A&B, COVID) ARPGX2
Influenza A by PCR: NEGATIVE
Influenza B by PCR: NEGATIVE
SARS Coronavirus 2 by RT PCR: POSITIVE — AB

## 2021-01-08 LAB — COMPREHENSIVE METABOLIC PANEL
ALT: 16 U/L (ref 0–44)
AST: 22 U/L (ref 15–41)
Albumin: 3.2 g/dL — ABNORMAL LOW (ref 3.5–5.0)
Alkaline Phosphatase: 55 U/L (ref 38–126)
Anion gap: 6 (ref 5–15)
BUN: 14 mg/dL (ref 8–23)
CO2: 27 mmol/L (ref 22–32)
Calcium: 8.7 mg/dL — ABNORMAL LOW (ref 8.9–10.3)
Chloride: 105 mmol/L (ref 98–111)
Creatinine, Ser: 0.78 mg/dL (ref 0.44–1.00)
GFR, Estimated: >60 mL/min (ref >60)
Glucose, Bld: 222 mg/dL — ABNORMAL HIGH (ref 70–99)
Potassium: 3.2 mmol/L — ABNORMAL LOW (ref 3.5–5.1)
Sodium: 138 mmol/L (ref 135–145)
Total Bilirubin: 0.5 mg/dL (ref 0.3–1.2)
Total Protein: 6.1 g/dL — ABNORMAL LOW (ref 6.5–8.1)

## 2021-01-08 LAB — HEMOGLOBIN A1C
Hgb A1c MFr Bld: 10.7 % — ABNORMAL HIGH (ref 4.8–5.6)
Mean Plasma Glucose: 260.39 mg/dL

## 2021-01-08 LAB — MAGNESIUM: Magnesium: 1.7 mg/dL (ref 1.7–2.4)

## 2021-01-08 LAB — CBG MONITORING, ED
Glucose-Capillary: 253 mg/dL — ABNORMAL HIGH (ref 70–99)
Glucose-Capillary: 236 mg/dL — ABNORMAL HIGH (ref 70–99)
Glucose-Capillary: 240 mg/dL — ABNORMAL HIGH (ref 70–99)
Glucose-Capillary: 290 mg/dL — ABNORMAL HIGH (ref 70–99)
Glucose-Capillary: 220 mg/dL — ABNORMAL HIGH (ref 70–99)

## 2021-01-08 LAB — PROCALCITONIN: Procalcitonin: <0.1 ng/mL

## 2021-01-08 MED ORDER — LEVOTHYROXINE SODIUM 137 MCG PO TABS: 137.0000 ug | ORAL_TABLET | Freq: Every day | ORAL | Status: DC

## 2021-01-08 MED ORDER — ONDANSETRON HCL 4 MG/2ML IJ SOLN: 4.0000 mg | Freq: Four times a day (QID) | INTRAMUSCULAR | Status: DC | PRN

## 2021-01-08 MED ORDER — CLOPIDOGREL BISULFATE 75 MG PO TABS: 75.0000 mg | ORAL_TABLET | Freq: Every day | ORAL | Status: DC

## 2021-01-08 MED ORDER — PEN NEEDLES 31G X 5 MM MISC
1.0000 | 3 refills | Status: DC | PRN
Start: 2021-01-08 — End: 2022-08-16

## 2021-01-08 MED ORDER — INSULIN ASPART 100 UNIT/ML FLEXPEN: 7.0000 [IU] | PEN_INJECTOR | Freq: Three times a day (TID) | SUBCUTANEOUS | 11 refills | Status: DC

## 2021-01-08 MED ORDER — SODIUM CHLORIDE 0.9 % IV SOLN: 2.0000 g | INTRAVENOUS | Status: DC

## 2021-01-08 MED ORDER — INSULIN ASPART 100 UNIT/ML IJ SOLN
0.0000 [IU] | Freq: Every day | INTRAMUSCULAR | Status: DC
  Filled 2021-01-08: qty 0.05

## 2021-01-08 MED ORDER — ALBUTEROL SULFATE (2.5 MG/3ML) 0.083% IN NEBU: 2.5000 mg | INHALATION_SOLUTION | RESPIRATORY_TRACT | Status: DC | PRN

## 2021-01-08 MED ORDER — INSULIN ASPART 100 UNIT/ML IJ SOLN
4.0000 [IU] | Freq: Three times a day (TID) | INTRAMUSCULAR | Status: DC
  Administered 2021-01-08 (×2): 4 [IU] via SUBCUTANEOUS
  Filled 2021-01-08: qty 0.04

## 2021-01-08 MED ORDER — BLOOD GLUCOSE METER KIT: PACK | 0 refills | Status: DC

## 2021-01-08 MED ORDER — ATORVASTATIN CALCIUM 40 MG PO TABS: 80.0000 mg | ORAL_TABLET | Freq: Every day | ORAL | 1 refills | Status: DC

## 2021-01-08 MED ORDER — ACETAMINOPHEN 325 MG PO TABS: 650.0000 mg | ORAL_TABLET | Freq: Four times a day (QID) | ORAL | Status: DC | PRN

## 2021-01-08 MED ORDER — INSULIN ASPART 100 UNIT/ML IJ SOLN
0.0000 [IU] | Freq: Three times a day (TID) | INTRAMUSCULAR | Status: DC
  Administered 2021-01-08: 09:00:00 5 [IU] via SUBCUTANEOUS
  Administered 2021-01-08: 12:00:00 8 [IU] via SUBCUTANEOUS
  Filled 2021-01-08: qty 0.15

## 2021-01-08 MED ORDER — PANTOPRAZOLE SODIUM 40 MG PO TBEC
40.0000 mg | DELAYED_RELEASE_TABLET | Freq: Every day | ORAL | Status: DC
  Administered 2021-01-08: 09:00:00 40 mg via ORAL
  Filled 2021-01-08: qty 1

## 2021-01-08 MED ORDER — ATORVASTATIN CALCIUM 40 MG PO TABS
80.0000 mg | ORAL_TABLET | Freq: Every day | ORAL | Status: DC
  Administered 2021-01-08: 09:00:00 80 mg via ORAL
  Filled 2021-01-08: qty 2

## 2021-01-08 MED ORDER — AMLODIPINE BESYLATE 5 MG PO TABS
5.0000 mg | ORAL_TABLET | Freq: Every day | ORAL | Status: DC
  Administered 2021-01-08: 09:00:00 5 mg via ORAL
  Filled 2021-01-08: qty 1

## 2021-01-08 MED ORDER — POTASSIUM CHLORIDE CRYS ER 20 MEQ PO TBCR
40.0000 meq | EXTENDED_RELEASE_TABLET | ORAL | Status: AC
  Administered 2021-01-08 (×2): 40 meq via ORAL
  Filled 2021-01-08 (×2): qty 2

## 2021-01-08 MED ORDER — METFORMIN HCL 1000 MG PO TABS: 1000.0000 mg | ORAL_TABLET | Freq: Two times a day (BID) | ORAL | 0 refills | Status: DC

## 2021-01-08 MED ORDER — INSULIN ASPART 100 UNIT/ML IJ SOLN
0.0000 [IU] | Freq: Three times a day (TID) | INTRAMUSCULAR | Status: DC
  Administered 2021-01-08: 17:00:00 5 [IU] via SUBCUTANEOUS
  Filled 2021-01-08: qty 0.15

## 2021-01-08 MED ORDER — ACETAMINOPHEN 650 MG RE SUPP: 650.0000 mg | Freq: Four times a day (QID) | RECTAL | Status: DC | PRN

## 2021-01-08 MED ORDER — ONDANSETRON HCL 4 MG PO TABS: 4.0000 mg | ORAL_TABLET | Freq: Four times a day (QID) | ORAL | Status: DC | PRN

## 2021-01-08 MED ORDER — DONEPEZIL HCL 5 MG PO TABS: 10.0000 mg | ORAL_TABLET | Freq: Every day | ORAL | Status: DC

## 2021-01-08 MED ORDER — AZITHROMYCIN 250 MG PO TABS
500.0000 mg | ORAL_TABLET | Freq: Every day | ORAL | Status: DC
  Administered 2021-01-08: 09:00:00 500 mg via ORAL
  Filled 2021-01-08: qty 2

## 2021-01-08 MED ORDER — ENOXAPARIN SODIUM 40 MG/0.4ML IJ SOSY
40.0000 mg | PREFILLED_SYRINGE | INTRAMUSCULAR | Status: DC
  Administered 2021-01-08: 09:00:00 40 mg via SUBCUTANEOUS
  Filled 2021-01-08: qty 0.4

## 2021-01-08 NOTE — ED Notes (Signed)
Allison [name] from [facility] called, [phone] states that pt is ready for d/c and they have reviewed her medications, states that they talked with the son and he is coming to get her.  Attempted to call son to confirm; however, he didn't answer.

## 2021-01-08 NOTE — ED Notes (Signed)
Pt in bed, pt's son at bedside, pt eating her meal tray, pt states that she doesn't have pain and she is ready to go home. D/c pt iv, cath intact, pt and pt's son verbalized understanding d/c instructions and follow up, advised to return for any concerns or worsening symptoms.

## 2021-01-08 NOTE — ED Notes (Signed)
Talked with Allison [name] from [facility], helped to connect her with md who is caring for pt, MD [place].    Per MD Cyndia Skeeters, he is waiting to talk with [facility] and then she will be D/C

## 2021-01-08 NOTE — H&P (Signed)
History and Physical    BRIGGITTE BOLINE WNU:[id number] DOB: 12-06-1934 DOA: 01/07/2021  PCP: [facility] [place]   Patient coming from: [place]  I have personally briefly reviewed patient's old medical records in [place]  CC: weakness, high CBG HPI: 86 year old lady who lives in a group home diagnosed with COVID on 23 December.  Reportedly noncompliant with her COVID medications and insulin.  History of diabetes.  EMS called out to the house by an unknown source.  Blood sugar meter read "high" for the paramedics.  Serum glucose greater than 500.  Patient found to have a left lower lobe pneumonia on chest x-ray.  Patient is not hypoxic.  Patient is afebrile.  No leukocytosis.  Patient is a poor historian and cannot give complete history or review of systems.  Pneumonia severity score of class III  [facility] hospitalist asked to meet the patient.    ED Course: Not hypoxic, not febrile on admission.  Serum glucose greater than 500.  Chest x-ray shows left lower lobe pneumonia.  No leukocytosis.  Review of Systems:  Review of Systems  Unable to perform ROS: Dementia   Past Medical History:  Diagnosis Date   Acute ischemic stroke ([place]) 10/29/2015   Carpal tunnel syndrome    Dementia ([place])    Diabetes mellitus without complication (HCC)    Dysphagia    GERD (gastroesophageal reflux disease)    Hiatal hernia    HLD (hyperlipidemia)    Hx of adenomatous colonic polyps 06/19/2014   Hyperlipidemia    Hypertension    Hypothyroidism    Onychomycosis 03/05/2012   Pain in lower limb 04/18/2013   Sciatic nerve pain    Vertigo     Past Surgical History:  Procedure Laterality Date   BLADDER SURGERY     Mesh implant   COLONOSCOPY     LARYNX SURGERY     vocal cords   PARTIAL HYSTERECTOMY  1978   VESICOVAGINAL FISTULA CLOSURE W/ TAH  1976     reports that she has never smoked. She has never used smokeless tobacco. She reports that she does not  drink alcohol and does not use drugs.  Allergies  Allergen Reactions   Lantus [Insulin Glargine] Itching and Swelling    angioedema   Levemir [Insulin Detemir] Itching and Swelling    angioedema   Pineapple Anaphylaxis   Codeine Other (See Comments)    unknown   Invokana [Canagliflozin] Other (See Comments)    Constipation, nightmares   Penicillins Other (See Comments)    Unknown    Tradjenta [Linagliptin] Other (See Comments)    Unknown   Vioxx [Rofecoxib] Other (See Comments)    unknown    Family History  Problem Relation Age of Onset   Alzheimer's disease Mother    Glaucoma Mother    Heart disease Father    Alzheimer's disease Father    Asthma Sister    Allergies Sister    Allergies Sister    Sleep apnea Sister    Heart disease Sister    Breast cancer Maternal Aunt    Pancreatic cancer Maternal Aunt    Prostate cancer Maternal Uncle    Prostate cancer Paternal Uncle    Alcoholism Maternal Uncle    Kidney disease Maternal Aunt    Heart disease Maternal Aunt     Prior to Admission medications   Medication Sig Start Date End Date Taking? Authorizing Provider  amLODipine (NORVASC) 5 MG tablet TAKE ONE TABLET  BY MOUTH DAILY Patient taking differently: Take 5 mg by mouth daily. 03/11/18   Lauree Chandler, NP  ASSURE LANCE LANCETS 21G MISC 1 each by Does not apply route 4 (four) times daily. DX E11.65 01/04/18   Hendricks Limes, MD  atorvastatin (LIPITOR) 80 MG tablet TAKE ONE TABLET BY MOUTH DAILY Patient taking differently: Take 80 mg by mouth daily at 6 PM. 03/11/18   Lauree Chandler, NP  Blood Glucose Monitoring Suppl (ONETOUCH VERIO) w/Device KIT 1 each by Does not apply route 3 (three) times daily. Use to test blood sugar three times daily. Dx: E11.21 06/15/17   Gildardo Cranker, DO  canagliflozin (INVOKANA) 100 MG TABS tablet Take 100 mg by mouth daily before breakfast.    [provider]  citalopram (CELEXA) 10 MG tablet Take 1 tablet (10 mg total) by  mouth daily For anxiety in the morning 03/10/18   Lauree Chandler, NP  clopidogrel (PLAVIX) 75 MG tablet Take 1 tablet (75 mg total) by mouth daily in the morning for history of stroke 03/10/18   Lauree Chandler, NP  donepezil (ARICEPT) 10 MG tablet Take 1 tablet (10 mg total) by mouth at bedtime. For memory 03/10/18   Lauree Chandler, NP  Exenatide ER (BYDUREON BCISE) 2 MG/0.85ML AUIJ Inject 2 mg into the skin once a week.    [provider]  Glucerna (GLUCERNA) LIQD Take 237 mLs by mouth 2 (two) times daily between meals.    [provider]  glucose blood (ONETOUCH VERIO) test strip Check blood sugar three times daily DX E11.21 (One Touch Verio IQ) 06/15/17   Gildardo Cranker, DO  insulin aspart protamine - aspart (NOVOLOG MIX 70/30 FLEXPEN) (70-30) 100 UNIT/ML FlexPen 100-150=1units, 151-200=2units, 201-250=3units, 251-300=5units, 301-350=7units, greater than 350=9units Patient taking differently: Inject 30-70 Units into the skin See admin instructions. 100-150=1units, 151-200=2units, 201-250=3units, 251-300=5units, 301-350=7units, greater than 350=9units 03/11/18   Ngetich, Dinah C, NP  Insulin Pen Needle (PEN NEEDLES) 31G X 5 MM MISC 1 each by Does not apply route as needed. Use when giving insulin injections. Dx: E11.9 06/15/17   Gildardo Cranker, DO  levothyroxine (SYNTHROID) 137 MCG tablet Take 137 mcg by mouth daily before breakfast.    [provider]  magnesium oxide (MAG-OX) 400 MG tablet Take 1 tablet (400 mg total) by mouth daily. 03/10/18   Lauree Chandler, NP  metFORMIN (GLUCOPHAGE) 1000 MG tablet TAKE TWO TABLETS BY MOUTH DAILY WITH A MEAL Patient taking differently: Take 1,000 mg by mouth 2 (two) times daily with a meal. 03/11/18   Dewaine Oats, Carlos American, NP  ondansetron (ZOFRAN) 4 MG tablet Take 4 mg by mouth every 6 (six) hours as needed for nausea or vomiting.    [provider]  pantoprazole (PROTONIX) 40 MG tablet Take 1 tablet (40 mg total) by mouth  daily. 03/10/18   Lauree Chandler, NP    Physical Exam: Vitals:   01/07/21 2215 01/07/21 2230 01/07/21 2245 01/07/21 2300  BP: (!) 167/79 (!) 175/74 (!) 183/77 (!) 192/78  Pulse: (!) 56 (!) 52 67 (!) 57  Resp: _0 Temp:      TempSrc:      SpO2: 100% 100% 99% 100%  Weight:      Height:        Physical Exam Vitals and nursing note reviewed.  Constitutional:      General: She is not in acute distress.    Appearance: She is normal weight. She  is not ill-appearing, toxic-appearing or diaphoretic.  HENT:     Head: Normocephalic and atraumatic.     Nose: Nose normal. No rhinorrhea.  Eyes:     General:        Right eye: No discharge.        Left eye: No discharge.  Cardiovascular:     Rate and Rhythm: Normal rate and regular rhythm.     Pulses: Normal pulses.  Pulmonary:     Effort: Pulmonary effort is normal. No respiratory distress.     Breath sounds: Normal breath sounds. No wheezing or rales.  Abdominal:     General: Abdomen is flat. Bowel sounds are normal. There is no distension.     Tenderness: There is no abdominal tenderness. There is no guarding or rebound.  Musculoskeletal:     Right lower leg: No edema.  Skin:    General: Skin is warm and dry.     Capillary Refill: Capillary refill takes less than 2 seconds.  Neurological:     Mental Status: She is alert. She is disoriented.     Labs on Admission: I have personally reviewed following labs and imaging studies  CBC: Recent Labs  Lab 01/07/21 2135  WBC 7.8  HGB 11.6*  HCT 34.5*  MCV 81.2  PLT 856   Basic Metabolic Panel: Recent Labs  Lab 01/07/21 2135  NA 134*  K 3.5  CL 102  CO2 24  GLUCOSE 503*  BUN 17  CREATININE 0.82  CALCIUM 8.6*   GFR: Estimated Creatinine Clearance: 44.1 mL/min (by C-G formula based on SCr of 0.82 mg/dL). Liver Function Tests: No results for input(s): AST, ALT, ALKPHOS, BILITOT, PROT, ALBUMIN in the last 168 hours. No results for input(s): LIPASE, AMYLASE  in the last 168 hours. No results for input(s): AMMONIA in the last 168 hours. Coagulation Profile: No results for input(s): INR, PROTIME in the last 168 hours. Cardiac Enzymes: No results for input(s): CKTOTAL, CKMB, CKMBINDEX, TROPONINI in the last 168 hours. BNP (last 3 results) No results for input(s): PROBNP in the last 8760 hours. HbA1C: No results for input(s): HGBA1C in the last 72 hours. CBG: Recent Labs  Lab 01/07/21 2130 01/07/21 2316  GLUCAP 520* 468*   Lipid Profile: No results for input(s): CHOL, HDL, LDLCALC, TRIG, CHOLHDL, LDLDIRECT in the last 72 hours. Thyroid Function Tests: No results for input(s): TSH, T4TOTAL, FREET4, T3FREE, THYROIDAB in the last 72 hours. Anemia Panel: No results for input(s): VITAMINB12, FOLATE, FERRITIN, TIBC, IRON, RETICCTPCT in the last 72 hours. Urine analysis:    Component Value Date/Time   COLORURINE STRAW (A) 01/07/2021 2318   APPEARANCEUR CLEAR 01/07/2021 2318   LABSPEC 1.022 01/07/2021 2318   PHURINE 7.0 01/07/2021 2318   GLUCOSEU >=500 (A) 01/07/2021 2318   HGBUR NEGATIVE 01/07/2021 2318   BILIRUBINUR NEGATIVE 01/07/2021 2318   KETONESUR NEGATIVE 01/07/2021 2318   PROTEINUR NEGATIVE 01/07/2021 2318   UROBILINOGEN 0.2 03/29/2014 1055   NITRITE NEGATIVE 01/07/2021 2318   LEUKOCYTESUR SMALL (A) 01/07/2021 2318    Radiological Exams on Admission: I have personally reviewed images DG Chest Portable 1 View  Result Date: 01/07/2021 CLINICAL DATA:  Cough and fever EXAM: PORTABLE CHEST 1 VIEW COMPARISON:  03/22/2019 FINDINGS: Airspace disease at the left lung base with possible small effusion. Stable cardiomediastinal silhouette with aortic atherosclerosis. No pneumothorax. IMPRESSION: Airspace disease at left lung base concerning for a pneumonia. Possible small left effusion. Radiographic follow-up to resolution is recommended Electronically Signed   By: Maudie Mercury  Francoise Ceo M.D.   On: 01/07/2021 22:09    EKG: I have personally  reviewed EKG: no EKG  Assessment/Plan Principal Problem:   Pneumonia Active Problems:   Hypothyroidism   Essential hypertension   Diabetes mellitus with hyperglycemia (HCC)   History of stroke   GERD    Pneumonia Assign to observation. IV rocephin and po zithromax. Check procalcitonin. On RA. No leukocytosis. May be able to go home in 24hours.  Hypothyroidism Stable.  Essential hypertension Continue home meds.  Diabetes mellitus with hyperglycemia (HCC) Serum glucose greater than 500.  We will give 1 dose of IV insulin.  Continue insulin here.  Check A1c.  History of stroke Chronic.  GERD Continue PPI.  DVT prophylaxis: Lovenox Code Status: Full Code Family Communication: no family at bedside  Disposition Plan: return to group home  Consults called: none  Admission status: Observation, Med-Surg   Kristopher Oppenheim, DO [facility] 01/08/2021, 12:03 AM

## 2021-01-08 NOTE — ED Notes (Signed)
Attempted to call son to pick pt up

## 2021-01-08 NOTE — Discharge Summary (Signed)
Physician Discharge Summary  Allison [name] IBB:[id number] DOB: 1934/10/20 DOA: 01/07/2021  PCP: [facility] [place] date: 01/07/2021 Discharge date: 01/08/2021 Admitted From: Home Disposition: Home Recommendations for Outpatient Follow-up:  Follow ups as below. Please obtain CBC/BMP/Mag at follow up Please follow up on the following pending results: None Home Health: None.  Patient is enrolled with [facility] Equipment/Devices: None Discharge Condition: Stable CODE STATUS: Full code  Hospital Course: 86 year old F with PMH of diabetes of diabetic medication, HTN, HLD and recent diagnosis of COVID-19 infection on 01/03/2021 brought to ED by EMS with shortness of breath, confusion, poor p.o. intake and markedly elevated blood glucose, and admitted for hyperglycemia and possible LLL pneumonia.   She was hyperglycemic to 520 but not in DKA or HHS.  A1c elevated to 10.7%.  Of note, patient was taken off the metformin on 12/11/2020.  CXR concerning for LLL opacity.  However, patient had no fever, leukocytosis or oxygen requirement.  Started on ceftriaxone and azithromycin.  However, procalcitonin negative.  She remained stable on room air.  Ceftriaxone and azithromycin discontinued.  Hyperglycemia improved with IV insulin.  Patient is discharged on NovoLog 7 units 3 times daily.  Resume home metformin at 1000 mg twice daily.  Contacted [facility], Clemens Catholic who is covering for patient's PCP, and notified about the discharge plan and follow-up.  See individual problem list below for more on hospital course.  Discharge Diagnoses:  Uncontrolled DM-2 with hyperglycemia: A1c 10.7%.  Patient was recently taken off metformin by PCP.  Has history of anaphylactic reaction to Lantus or Levemir.  Tolerating NovoLog Recent Labs  Lab 01/08/21 0106 01/08/21 0455 01/08/21 0735 01/08/21 1155 01/08/21 1438  GLUCAP 253* 236* 240* 290* 220*  -Discharged on  NovoLog 7 units 3 times daily before meals -Resumed metformin 1000 mg twice daily -Resumed atorvastatin at 40 mg daily  COVID-19 infection: Tested positive on 01/03/2021.  Does not seem to have respiratory symptoms.  Saturating 100% on RA. -No indication for treatment. -Isolation precaution discussed as below  Abnormal CXR: Concern for LLL opacity likely atelectasis versus pneumonia.She has no fever, leukocytosis or respiratory symptoms.  Procalcitonin negative. -Pneumonia ruled out -Antibiotic discontinued  History of CVA -Atorvastatin as above. -Defer decision about Plavix to PCP  Cognitive impairment: Oriented to self, place, year and the president of [place].  Likely her baseline. -Continue home Aricept.  Essential hypertension: BP elevated. -Resume home amlodipine.  Body mass index is 20.77 kg/m.           Discharge Exam: Vitals:   01/08/21 1500 01/08/21 1600 01/08/21 1603 01/08/21 1729  BP: (!) 117/92 123/72 (!) 141/72 (!) 170/91  Pulse: 86 83 88 90  Temp:   97.8 F (36.6 C) 97.8 F (36.6 C)  Resp: '16  17 18  ' Height:      Weight:      SpO2: 100% 100% 100% 100%  TempSrc:   Oral Oral  BMI (Calculated):         GENERAL: No apparent distress.  Nontoxic. HEENT: MMM.  Vision and hearing grossly intact.  NECK: Supple.  No apparent JVD.  RESP: 100% on RA.  No IWOB.  Fair aeration bilaterally. CVS:  RRR. Heart sounds normal.  ABD/GI/GU: Bowel sounds present. Soft. Non tender.  MSK/EXT:  Moves extremities. No apparent deformity. No edema.  SKIN: no apparent skin lesion or wound NEURO: Awake and alert.  Oriented x4 except date of months.  No apparent focal neuro deficit. PSYCH: Calm. Normal affect.   Discharge Instructions  Discharge Instructions     Call MD for:  difficulty breathing, headache or visual disturbances   Complete by: As directed    Call MD for:  extreme fatigue   Complete by: As directed    Call MD for:  persistant dizziness or  light-headedness   Complete by: As directed    Diet - low sodium heart healthy   Complete by: As directed    Diet Carb Modified   Complete by: As directed    Discharge instructions   Complete by: As directed    It has been a pleasure taking care of you!  You were hospitalized due to high blood glucose, confusion and shortness of breath.  Your blood glucose improved with insulin.  We are discharging you on insulin to continue using at home in addition to your home medications.  We recommend good hydration and follow-up with your primary care doctor as soon as possible.  In regards to COVID infection, recommend appropriate precaution with appropriate mask and hand sanitizer for 10 days from the day you tested positive.    Take care,   Increase activity slowly   Complete by: As directed       Allergies as of 01/08/2021       Reactions   Lantus [insulin Glargine] Itching, Swelling   angioedema   Levemir [insulin Detemir] Itching, Swelling   angioedema   Pineapple Anaphylaxis   Codeine Other (See Comments)   unknown   Invokana [canagliflozin] Other (See Comments)   Constipation, nightmares   Penicillins Other (See Comments)   Unknown   Sulfa Antibiotics Other (See Comments)   UNk   Tradjenta [linagliptin] Other (See Comments)   Unknown   Vioxx [rofecoxib] Other (See Comments)   unknown   Exelon [rivastigmine] Rash        Medication List     STOP taking these medications    azithromycin 500 MG tablet Commonly known as: ZITHROMAX   clopidogrel 75 MG tablet Commonly known as: PLAVIX       TAKE these medications    amLODipine 5 MG tablet Commonly known as: NORVASC TAKE ONE TABLET BY MOUTH DAILY   ascorbic acid 500 MG tablet Commonly known as: VITAMIN C Take 500 mg by mouth daily. X 10 days   Assure Lance Lancets 21G Misc 1 each by Does not apply route 4 (four) times daily. DX E11.65   atorvastatin 40 MG tablet Commonly known as: LIPITOR Take 2 tablets  (80 mg total) by mouth daily. What changed: medication strength   blood glucose meter kit and supplies Dispense based on patient and insurance preference. Use up to four times daily as directed. (FOR ICD-10 E10.9, E11.9).   donepezil 10 MG tablet Commonly known as: ARICEPT Take 1 tablet (10 mg total) by mouth at bedtime. For memory   Glucerna Liqd Take 237 mLs by mouth 2 (two) times daily between meals.   glucose blood test strip Commonly known as: OneTouch Verio Check blood sugar three times daily DX E11.21 (One Touch Verio IQ)   insulin aspart 100 UNIT/ML FlexPen Commonly known as: NOVOLOG Inject 7 Units into the skin 3 (three) times daily with meals. Hold if blood glucose less than 100   levothyroxine 125 MCG tablet Commonly known as: SYNTHROID Take 125 mcg by mouth daily before breakfast.   Melatonin 2.5 MG Chew Chew 2.5 mg by mouth at bedtime.   metFORMIN 1000  MG tablet Commonly known as: GLUCOPHAGE Take 1 tablet (1,000 mg total) by mouth 2 (two) times daily with a meal. What changed: See the new instructions.   OLANZapine 2.5 MG tablet Commonly known as: ZYPREXA Take 2.5 mg by mouth at bedtime.   OneTouch Verio w/Device Kit 1 each by Does not apply route 3 (three) times daily. Use to test blood sugar three times daily. Dx: E11.21   pantoprazole 40 MG tablet Commonly known as: PROTONIX Take 1 tablet (40 mg total) by mouth daily.   Pen Needles 31G X 5 MM Misc 1 each by Does not apply route as needed. Use when giving insulin injections. Dx: E11.9   sennosides-docusate sodium 8.6-50 MG tablet Commonly known as: SENOKOT-S Take 2 tablets by mouth daily as needed for constipation.   zinc gluconate 50 MG tablet Take 50 mg by mouth daily.        Consultations: None  Procedures/Studies:   DG Chest Portable 1 View  Result Date: 01/07/2021 CLINICAL DATA:  Cough and fever EXAM: PORTABLE CHEST 1 VIEW COMPARISON:  03/22/2019 FINDINGS: Airspace disease at the  left lung base with possible small effusion. Stable cardiomediastinal silhouette with aortic atherosclerosis. No pneumothorax. IMPRESSION: Airspace disease at left lung base concerning for a pneumonia. Possible small left effusion. Radiographic follow-up to resolution is recommended Electronically Signed   By: Donavan Foil M.D.   On: 01/07/2021 22:09       The results of significant diagnostics from this hospitalization (including imaging, microbiology, ancillary and laboratory) are listed below for reference.     Microbiology: Recent Results (from the past 240 hour(s))  Resp Panel by RT-PCR (Flu A&B, Covid) Nasopharyngeal Swab     Status: Abnormal   Collection Time: 01/07/21 11:18 PM   Specimen: Nasopharyngeal Swab; Nasopharyngeal(NP) swabs in vial transport medium  Result Value Ref Range Status   SARS Coronavirus 2 by RT PCR POSITIVE (A) NEGATIVE Final    Comment: (NOTE) SARS-CoV-2 target nucleic acids are DETECTED.  The SARS-CoV-2 RNA is generally detectable in upper respiratory specimens during the acute phase of infection. Positive results are indicative of the presence of the identified virus, but do not rule out bacterial infection or co-infection with other pathogens not detected by the test. Clinical correlation with patient history and other diagnostic information is necessary to determine patient infection status. The expected result is Negative.  Fact Sheet for Patients: EntrepreneurPulse.com.au  Fact Sheet for Healthcare Providers: IncredibleEmployment.be  This test is not yet approved or cleared by the [place] FDA and  has been authorized for detection and/or diagnosis of SARS-CoV-2 by FDA under an Emergency Use Authorization (EUA).  This EUA will remain in effect (meaning this test can be used) for the duration of  the COVID-19 declaration under Section 564(b)(1) of the A ct, 21 U.S.C. section 360bbb-3(b)(1), unless the  authorization is terminated or revoked sooner.     Influenza A by PCR NEGATIVE NEGATIVE Final   Influenza B by PCR NEGATIVE NEGATIVE Final    Comment: (NOTE) The Xpert Xpress SARS-CoV-2/FLU/RSV plus assay is intended as an aid in the diagnosis of influenza from Nasopharyngeal swab specimens and should not be used as a sole basis for treatment. Nasal washings and aspirates are unacceptable for Xpert Xpress SARS-CoV-2/FLU/RSV testing.  Fact Sheet for Patients: EntrepreneurPulse.com.au  Fact Sheet for Healthcare Providers: IncredibleEmployment.be  This test is not yet approved or cleared by the [place] FDA and has been authorized for detection and/or diagnosis of SARS-CoV-2 by FDA  under an Emergency Use Authorization (EUA). This EUA will remain in effect (meaning this test can be used) for the duration of the COVID-19 declaration under Section 564(b)(1) of the Act, 21 U.S.C. section 360bbb-3(b)(1), unless the authorization is terminated or revoked.  Performed at [facility], [place] [address] [place], [place] [address]      Labs:  CBC: Recent Labs  Lab 01/07/21 2135 01/08/21 0403  WBC 7.8 9.5  NEUTROABS  --  5.5  HGB 11.6* 12.3  HCT 34.5* 35.5*  MCV 81.2 80.0  PLT 179 177   BMP &GFR Recent Labs  Lab 01/07/21 2135 01/08/21 0403  NA 134* 138  K 3.5 3.2*  CL 102 105  CO2 24 27  GLUCOSE 503* 222*  BUN 17 14  CREATININE 0.82 0.78  CALCIUM 8.6* 8.7*  MG  --  1.7   Estimated Creatinine Clearance: 45.2 mL/min (by C-G formula based on SCr of 0.78 mg/dL). Liver & Pancreas: Recent Labs  Lab 01/08/21 0403  AST 22  ALT 16  ALKPHOS 55  BILITOT 0.5  PROT 6.1*  ALBUMIN 3.2*   No results for input(s): LIPASE, AMYLASE in the last 168 hours. No results for input(s): AMMONIA in the last 168 hours. Diabetic: Recent Labs    01/08/21 0403  HGBA1C 10.7*   Recent Labs  Lab 01/08/21 0106 01/08/21 0455  01/08/21 0735 01/08/21 1155 01/08/21 1438  GLUCAP 253* 236* 240* 290* 220*   Cardiac Enzymes: No results for input(s): CKTOTAL, CKMB, CKMBINDEX, TROPONINI in the last 168 hours. No results for input(s): PROBNP in the last 8760 hours. Coagulation Profile: No results for input(s): INR, PROTIME in the last 168 hours. Thyroid Function Tests: No results for input(s): TSH, T4TOTAL, FREET4, T3FREE, THYROIDAB in the last 72 hours. Lipid Profile: No results for input(s): CHOL, HDL, LDLCALC, TRIG, CHOLHDL, LDLDIRECT in the last 72 hours. Anemia Panel: No results for input(s): VITAMINB12, FOLATE, FERRITIN, TIBC, IRON, RETICCTPCT in the last 72 hours. Urine analysis:    Component Value Date/Time   COLORURINE STRAW (A) 01/07/2021 2318   APPEARANCEUR CLEAR 01/07/2021 2318   LABSPEC 1.022 01/07/2021 2318   PHURINE 7.0 01/07/2021 2318   GLUCOSEU >=500 (A) 01/07/2021 2318   HGBUR NEGATIVE 01/07/2021 2318   [place] 01/07/2021 2318   KETONESUR NEGATIVE 01/07/2021 2318   PROTEINUR NEGATIVE 01/07/2021 2318   UROBILINOGEN 0.2 03/29/2014 1055   NITRITE NEGATIVE 01/07/2021 2318   LEUKOCYTESUR SMALL (A) 01/07/2021 2318   Sepsis Labs: Invalid input(s): PROCALCITONIN, LACTICIDVEN   Time coordinating discharge: 45 minutes  SIGNED:  Mercy Riding, MD  [facility] 01/08/2021, 5:40 PM

## 2021-01-08 NOTE — Subjective & Objective (Signed)
CC: weakness, high CBG HPI: 86 year old lady who lives in a group home diagnosed with COVID on 23 December.  Reportedly noncompliant with her COVID medications and insulin.  History of diabetes.  EMS called out to the house by an unknown source.  Blood sugar meter read "high" for the paramedics.  Serum glucose greater than 500.  Patient found to have a left lower lobe pneumonia on chest x-ray.  Patient is not hypoxic.  Patient is afebrile.  No leukocytosis.  Patient is a poor historian and cannot give complete history or review of systems.  Pneumonia severity score of class III  [facility]ist asked to meet the patient.

## 2021-01-08 NOTE — Evaluation (Signed)
Occupational Therapy Evaluation Patient Details Name: Allison [name] MRN: [id number] DOB: Oct 20, 1934 Today's Date: 01/08/2021   History of Present Illness 86 yo female   presents 01/07/21 with worsening cough,fall, confusion, recent Positive covid Diagnosis chest xray concerning for pneumonia. PMH: Dementia, DM,. stroke,. patient lives with sister and son, [facility].   Clinical Impression   Allison [name] is an 86 year old woman who presents with above medical history. On evaluation patient reports she "feels fine now" and demonstrates ability to perform transfers, ambulation and ADLs. Supervision to ambulate in hospital room without device. Patient demonstrates the physical abilities and balance to perform ADLs. She was able to wash hands at sink, manage hospital gown in standing while therapist assisted with changing diaper (tabbed - and patient incontinenet at baseline) and get in and out of bed without assistance. Therapist did assist with donning and doffing socks due to height of stretcher. Patient limited by hospital environment - hospital gown, tabbed diaper, lines and leads that she typically doesn't have to deal with.  Patient appears to be at her baseline and can return home with frequent supervision from family. No further OT needs at this time.     Recommendations for follow up therapy are one component of a multi-disciplinary discharge planning process, led by the attending physician.  Recommendations may be updated based on patient status, additional functional criteria and insurance authorization.   Follow Up Recommendations  No OT follow up    Assistance Recommended at Discharge Frequent or constant Supervision/Assistance  Functional Status Assessment  Patient has not had a recent decline in their functional status  Equipment Recommendations  None recommended by OT    Recommendations for Other Services       Precautions / Restrictions Precautions Precautions:  Fall Restrictions Weight Bearing Restrictions: No      Mobility Bed Mobility Overal bed mobility: Needs Assistance Bed Mobility: Supine to Sit;Sit to Supine     Supine to sit: Supervision Sit to supine: Supervision   General bed mobility comments: for safety. able too scoot self up in the bed .    Transfers Overall transfer level: Needs assistance Equipment used: Rolling walker (2 wheels) Transfers: Sit to/from Stand Sit to Stand: Min guard           General transfer comment: barely supported on RW to stand      Balance Overall balance assessment: Mild deficits observed, not formally tested Sitting-balance support: No upper extremity supported;Feet supported Sitting balance-Leahy Scale: Good     Standing balance support: During functional activity;No upper extremity supported Standing balance-Leahy Scale: Fair                             ADL either performed or assessed with clinical judgement   ADL Overall ADL's : Needs assistance/impaired Eating/Feeding: Independent   Grooming: Supervision/safety   Upper Body Bathing: Independent   Lower Body Bathing: Supervison/ safety   Upper Body Dressing : Independent   Lower Body Dressing: Supervision/safety;Sit to/from stand   Toilet Transfer: Supervision/safety   Toileting- Water quality scientist and Hygiene: Supervision/safety         General ADL Comments: Supervision to ambulate in hospital room without device. Patient demonstrates the physical abilities and balance to perform ADLs. She was able to wash hands at sink, manage hospital gown in standing while therapist assisted with changing diaper (tabbed - and patient incontinenet at baseline) and get in and out of bed  without assistance. Patient limited by hospital environment - hospital gown, tabbed diaper, lines and leads that she typically doesn't have to deal with.     Vision   Vision Assessment?: No apparent visual deficits     Perception      Praxis      Pertinent Vitals/Pain Pain Assessment: No/denies pain     Hand Dominance Right   Extremity/Trunk Assessment Upper Extremity Assessment Upper Extremity Assessment: Overall WFL for tasks assessed (WFL ROM, 5/5 strength)   Lower Extremity Assessment Lower Extremity Assessment: Defer to PT evaluation   Cervical / Trunk Assessment Cervical / Trunk Assessment: Normal   Communication Communication Communication: No difficulties   Cognition Arousal/Alertness: Awake/alert Behavior During Therapy: WFL for tasks assessed/performed Overall Cognitive Status: History of cognitive impairments - at baseline Area of Impairment: Orientation;Safety/judgement;Awareness                 Orientation Level: Time;Situation;Place       Safety/Judgement: Decreased awareness of safety Awareness: Emergent   General Comments: Patient alert to self, place, month and year. Did state New Years had passed though.     General Comments       Exercises     Shoulder Instructions      Home Living Family/patient expects to be discharged to:: Private residence Living Arrangements: Children;Other relatives Available Help at Discharge: Family;Available 24 hours/day Type of Home: House Home Access: Stairs to enter [employer] of Steps: 1   Home Layout: One level     Bathroom Shower/Tub: Teacher, early years/pre: Handicapped height     Home Equipment: None   Additional Comments: unsure if has DME, patient reports doe not  use RW.      Prior Functioning/Environment Prior Level of Function : Patient poor historian/Family not available             Mobility Comments: has wander guard on  ankle, patient reports sister put it there so she will not get lost. ADLs Comments: unsure, pt states independent        OT Problem List: Decreased safety awareness;Decreased cognition      OT Treatment/Interventions:      OT Goals(Current goals can be  found in the care plan section) Acute Rehab OT Goals OT Goal Formulation: All assessment and education complete, DC therapy  OT Frequency:     Barriers to D/C:            Co-evaluation              AM-PAC OT "6 Clicks" Daily Activity     Outcome Measure Help from another person eating meals?: None Help from another person taking care of personal grooming?: None Help from another person toileting, which includes using toliet, bedpan, or urinal?: A Little Help from another person bathing (including washing, rinsing, drying)?: A Little Help from another person to put on and taking off regular upper body clothing?: A Little Help from another person to put on and taking off regular lower body clothing?: A Little 6 Click Score: 20   End of Session    Activity Tolerance: Patient tolerated treatment well Patient left: in bed;with call bell/phone within reach;with bed alarm set  OT Visit Diagnosis: Other symptoms and signs involving cognitive function                Time: 1025-1039 OT Time Calculation (min): 14 min Charges:  OT General Charges $OT Visit: 1 Visit OT Evaluation $OT Eval Low Complexity: 1 Low  Derl Barrow, OTR/[facility]  Office [phone] Pager: [place] 01/08/2021, 12:36 PM

## 2021-01-08 NOTE — Evaluation (Signed)
Physical Therapy Evaluation Patient Details Name: Allison [name] MRN: [id number] DOB: 1934/06/02 Today's Date: 01/08/2021  History of Present Illness  86 yo female   presents 01/07/21 with worsening cough,fall, confusion, recent Positive covid Diagnosis chest xray concerning for pneumonia. PMH: Dementia, DM,. stroke,. patient lives with sister and son, goes [facility].  Clinical Impression    The patient is pleasantly confused, reoriented multiple times of her location an d reason here. Patient asked if she had pneumonia. . Patient  resides with sister and son, ambulatory at baseline. Has a wander guard anklet which patient states that her sister placed . Patient proceeded to state that a friend of hers wandered out and died .Patient min guard to ambulate  a short distance in room, not using Rw for support. PT will follow while in hospital for mobility. Patient should be able to return home with family support.  Pt admitted with above diagnosis.  Pt currently with functional limitations due to the deficits listed below (see PT Problem List). Pt will benefit from skilled PT to increase their independence and safety with mobility to allow discharge to the venue listed below.        Recommendations for follow up therapy are one component of a multi-disciplinary discharge planning process, led by the attending physician.  Recommendations may be updated based on patient status, additional functional criteria and insurance authorization.  Follow Up Recommendations No PT follow up    Assistance Recommended at Discharge Intermittent Supervision/Assistance  Functional Status Assessment Patient has had a recent decline in their functional status and/or demonstrates limited ability to make significant improvements in function in a reasonable and predictable amount of time  Equipment Recommendations  None recommended by PT    Recommendations for Other Services       Precautions /  Restrictions Precautions Precautions: Fall      Mobility  Bed Mobility Overal bed mobility: Needs Assistance Bed Mobility: Supine to Sit;Sit to Supine     Supine to sit: Supervision Sit to supine: Supervision   General bed mobility comments: for safety. able too scoot self up in the bed .    Transfers Overall transfer level: Needs assistance Equipment used: Rolling walker (2 wheels) Transfers: Sit to/from Stand Sit to Stand: Min guard           General transfer comment: barely supported on RW to stand    Ambulation/Gait Ambulation/Gait assistance: Min guard Gait Distance (Feet): 20 Feet Assistive device: Rolling walker (2 wheels) Gait Pattern/deviations: Step-through pattern Gait velocity: decr     General Gait Details: patient did not really bear support in Rw when ambulating. PT  moved Rw along as patient ambulated.,  Stairs            Wheelchair Mobility    Modified Rankin (Stroke Patients Only)       Balance Overall balance assessment: Needs assistance Sitting-balance support: No upper extremity supported;Feet supported Sitting balance-Leahy Scale: Good     Standing balance support: During functional activity;No upper extremity supported Standing balance-Leahy Scale: Fair                               Pertinent Vitals/Pain Pain Assessment: No/denies pain    Home Living Family/patient expects to be discharged to:: Private residence Living Arrangements: Children;Other relatives Available Help at Discharge: Family;Available 24 hours/day Type of Home: House Home Access: Stairs to enter   [employer] of  Steps: 1   Home Layout: One level   Additional Comments: unsure if has DME, patient reports doe not  use RW.    Prior Function Prior Level of Function : Patient poor historian/Family not available             Mobility Comments: has wander guard on  ankle, patient reports sister put it there so she will not  get lost. ADLs Comments: unsure, pt states independnet     Hand Dominance   Dominant Hand: Right    Extremity/Trunk Assessment   Upper Extremity Assessment Upper Extremity Assessment: Overall WFL for tasks assessed    Lower Extremity Assessment Lower Extremity Assessment: Overall WFL for tasks assessed    Cervical / Trunk Assessment Cervical / Trunk Assessment: Normal  Communication   Communication: No difficulties  Cognition Arousal/Alertness: Awake/alert Behavior During Therapy: WFL for tasks assessed/performed Overall Cognitive Status: No family/caregiver present to determine baseline cognitive functioning Area of Impairment: Orientation;Safety/judgement;Awareness                 Orientation Level: Time;Situation;Place       Safety/Judgement: Decreased awareness of safety Awareness: Emergent   General Comments: RE-oriented multiple times that she is in [facility], has covid. patient states that she is confused and does not know what is going on .        General Comments      Exercises     Assessment/Plan    PT Assessment Patient needs continued PT services  PT Problem List Decreased strength;Decreased mobility;Decreased safety awareness;Decreased coordination;Decreased knowledge of precautions;Decreased activity tolerance;Decreased cognition       PT Treatment Interventions DME instruction;Therapeutic activities;Cognitive remediation;Gait training;Patient/family education;Functional mobility training    PT Goals (Current goals can be found in the Care Plan section)  Acute Rehab PT Goals Patient Stated Goal: did participate with  mobility/ambulation PT Goal Formulation: Patient unable to participate in goal setting Time For Goal Achievement: 01/22/21 Potential to Achieve Goals: Good    Frequency Min 2X/week   Barriers to discharge        Co-evaluation               AM-PAC PT "6 Clicks" Mobility  Outcome Measure Help needed turning from your  back to your side while in a flat bed without using bedrails?: None Help needed moving from lying on your back to sitting on the side of a flat bed without using bedrails?: None Help needed moving to and from a bed to a chair (including a wheelchair)?: A Little Help needed standing up from a chair using your arms (e.g., wheelchair or bedside chair)?: A Little Help needed to walk in hospital room?: A Little Help needed climbing 3-5 steps with a railing? : A Lot 6 Click Score: 19    End of Session   Activity Tolerance: Patient tolerated treatment well Patient left: in bed;with bed alarm set Nurse Communication: Mobility status PT Visit Diagnosis: Unsteadiness on feet (R26.81);Difficulty in walking, not elsewhere classified (R26.2)    Time: 5809-9833 PT Time Calculation (min) (ACUTE ONLY): 31 min   Charges:   PT Evaluation $PT Eval Low Complexity: 1 Low PT Treatments $Gait Training: 8-22 mins        Tresa Endo PT Acute Rehabilitation Services Pager [phone] Office [phone]   Claretha Cooper 01/08/2021, 10:01 AM

## 2021-01-08 NOTE — ED Notes (Signed)
Pt in bed, pt oriented to self, re oriented pt

## 2021-01-08 NOTE — ED Notes (Signed)
ED TO INPATIENT HANDOFF REPORT  ED Nurse Name and Phone #: Salvatore Decent Name/Age/Gender Allison [name] 86 y.o. female Room/Bed: WA13/WA13  Code Status   Code Status: Full Code  Home/SNF/Other Home Patient oriented to: self Is this baseline? Yes   Triage Complete: Triage complete  Chief Complaint Pneumonia [J18.9]  Triage Note No notes on file   Allergies Allergies  Allergen Reactions   Lantus [Insulin Glargine] Itching and Swelling    angioedema   Levemir [Insulin Detemir] Itching and Swelling    angioedema   Pineapple Anaphylaxis   Codeine Other (See Comments)    unknown   Invokana [Canagliflozin] Other (See Comments)    Constipation, nightmares   Penicillins Other (See Comments)    Unknown    Tradjenta [Linagliptin] Other (See Comments)    Unknown   Vioxx [Rofecoxib] Other (See Comments)    unknown    Level of Care/Admitting Diagnosis ED Disposition     ED Disposition  Admit   Condition  --   [facility]: [place] [[id number]]  Level of Care: Med-Surg [16]  May place patient in observation at [facility] or [place] if equivalent level of care is available:: Yes  Covid Evaluation: Symptomatic Person Under Investigation (PUI)  Diagnosis: Pneumonia [625638]  Admitting Physician: Bridgett Larsson, [place]  Attending Physician: Bridgett Larsson, ERIC [[id number]]          B Medical/Surgery History Past Medical History:  Diagnosis Date   Acute ischemic stroke ([place]) 10/29/2015   Carpal tunnel syndrome    Dementia ([place])    Diabetes mellitus without complication ([place])    Dysphagia    GERD (gastroesophageal reflux disease)    Hiatal hernia    HLD (hyperlipidemia)    Hx of adenomatous colonic polyps 06/19/2014   Hyperlipidemia    Hypertension    Hypothyroidism    Onychomycosis 03/05/2012   Pain in lower limb 04/18/2013   Sciatic nerve pain    Vertigo    Past Surgical History:  Procedure Laterality Date   BLADDER SURGERY     Mesh  implant   COLONOSCOPY     LARYNX SURGERY     vocal cords   PARTIAL HYSTERECTOMY  1978   VESICOVAGINAL FISTULA CLOSURE W/ TAH  1976     A IV Location/Drains/Wounds Patient Lines/Drains/Airways Status     Active Line/Drains/Airways     Name Placement date Placement time Site Days   Peripheral IV 01/07/21 20 G Left;Posterior Hand 01/07/21  2124  Hand  1            Intake/Output Last 24 hours  Intake/Output Summary (Last 24 hours) at 01/08/2021 1221 Last data filed at 01/08/2021 0211 Gross per 24 hour  Intake 1398.95 ml  Output --  Net 1398.95 ml    Labs/Imaging Results for orders placed or performed during the hospital encounter of 01/07/21 (from the past 48 hour(s))  CBG monitoring, ED     Status: Abnormal   Collection Time: 01/07/21  9:30 PM  Result Value Ref Range   Glucose-Capillary 520 (HH) 70 - 99 mg/dL    Comment: Glucose reference range applies only to samples taken after fasting for at least 8 hours.   Comment 1 Document in Chart   Basic metabolic panel     Status: Abnormal   Collection Time: 01/07/21  9:35 PM  Result Value Ref Range   Sodium 134 (L) 135 - 145 mmol/L   Potassium 3.5 3.5 - 5.1 mmol/L  Chloride 102 98 - 111 mmol/L   CO2 24 22 - 32 mmol/L   Glucose, Bld 503 (HH) 70 - 99 mg/dL    Comment: Glucose reference range applies only to samples taken after fasting for at least 8 hours. CRITICAL RESULT CALLED TO, READ BACK BY AND VERIFIED WITH:  FELICIA GREEN RN 73/41/93 @ 2226 VS    BUN 17 8 - 23 mg/dL   Creatinine, Ser 0.82 0.44 - 1.00 mg/dL   Calcium 8.6 (L) 8.9 - 10.3 mg/dL   GFR, Estimated >60 >60 mL/min    Comment: (NOTE) Calculated using the CKD-EPI Creatinine Equation (2021)    Anion gap 8 5 - 15    Comment: Performed at [facility], [place] [address] [place], [place] [address]  CBC     Status: Abnormal   Collection Time: 01/07/21  9:35 PM  Result Value Ref Range   WBC 7.8 4.0 - 10.5 K/uL   RBC 4.25 3.87 - 5.11  MIL/uL   Hemoglobin 11.6 (L) 12.0 - 15.0 g/dL   HCT 34.5 (L) 36.0 - 46.0 %   MCV 81.2 80.0 - 100.0 fL   MCH 27.3 26.0 - 34.0 pg   MCHC 33.6 30.0 - 36.0 g/dL   RDW 14.0 11.5 - 15.5 %   Platelets 179 150 - 400 K/uL   nRBC 0.0 0.0 - 0.2 %    Comment: Performed at [facility], [place] [address] [place], [place] [address]  POC CBG, ED     Status: Abnormal   Collection Time: 01/07/21 11:16 PM  Result Value Ref Range   Glucose-Capillary 468 (H) 70 - 99 mg/dL    Comment: Glucose reference range applies only to samples taken after fasting for at least 8 hours.  Urinalysis, Routine w reflex microscopic Nasopharyngeal Swab     Status: Abnormal   Collection Time: 01/07/21 11:18 PM  Result Value Ref Range   Color, Urine STRAW (A) YELLOW   APPearance CLEAR CLEAR   Specific Gravity, Urine 1.022 1.005 - 1.030   pH 7.0 5.0 - 8.0   Glucose, UA >=500 (A) NEGATIVE mg/dL   Hgb urine dipstick NEGATIVE NEGATIVE   Bilirubin Urine NEGATIVE NEGATIVE   Ketones, ur NEGATIVE NEGATIVE mg/dL   Protein, ur NEGATIVE NEGATIVE mg/dL   Nitrite NEGATIVE NEGATIVE   Leukocytes,Ua SMALL (A) NEGATIVE   RBC / HPF 0-5 0 - 5 RBC/hpf   WBC, UA 11-20 0 - 5 WBC/hpf   Bacteria, UA NONE SEEN NONE SEEN    Comment: Performed at [facility], [place] [address] [place],  [address]  Resp Panel by RT-PCR (Flu A&B, Covid) Nasopharyngeal Swab     Status: Abnormal   Collection Time: 01/07/21 11:18 PM   Specimen: Nasopharyngeal Swab; Nasopharyngeal(NP) swabs in vial transport medium  Result Value Ref Range   SARS Coronavirus 2 by RT PCR POSITIVE (A) NEGATIVE    Comment: (NOTE) SARS-CoV-2 target nucleic acids are DETECTED.  The SARS-CoV-2 RNA is generally detectable in upper respiratory specimens during the acute phase of infection. Positive results are indicative of the presence of the identified virus, but do not rule out bacterial infection or co-infection with other pathogens  not detected by the test. Clinical correlation with patient history and other diagnostic information is necessary to determine patient infection status. The expected result is Negative.  Fact Sheet for Patients: EntrepreneurPulse.com.au  Fact Sheet for Healthcare Providers: IncredibleEmployment.be  This test is not yet approved or cleared by the  [place] Architectural technologist and  has been authorized for detection and/or diagnosis of SARS-CoV-2 by FDA under an Print production planner (EUA).  This EUA will remain in effect (meaning this test can be used) for the duration of  the COVID-19 declaration under Section 564(b)(1) of the A ct, 21 U.S.C. section 360bbb-3(b)(1), unless the authorization is terminated or revoked sooner.     Influenza A by PCR NEGATIVE NEGATIVE   Influenza B by PCR NEGATIVE NEGATIVE    Comment: (NOTE) The Xpert Xpress SARS-CoV-2/FLU/RSV plus assay is intended as an aid in the diagnosis of influenza from Nasopharyngeal swab specimens and should not be used as a sole basis for treatment. Nasal washings and aspirates are unacceptable for Xpert Xpress SARS-CoV-2/FLU/RSV testing.  Fact Sheet for Patients: EntrepreneurPulse.com.au  Fact Sheet for Healthcare Providers: IncredibleEmployment.be  This test is not yet approved or cleared by the [place] FDA and has been authorized for detection and/or diagnosis of SARS-CoV-2 by FDA under an Emergency Use Authorization (EUA). This EUA will remain in effect (meaning this test can be used) for the duration of the COVID-19 declaration under Section 564(b)(1) of the Act, 21 U.S.C. section 360bbb-3(b)(1), unless the authorization is terminated or revoked.  Performed at [facility], [place] [address] [place], [place] [address]   CBG monitoring, ED     Status: Abnormal   Collection Time: 01/08/21  1:06 AM  Result Value Ref Range    Glucose-Capillary 253 (H) 70 - 99 mg/dL    Comment: Glucose reference range applies only to samples taken after fasting for at least 8 hours.  CBC with Differential/Platelet     Status: Abnormal   Collection Time: 01/08/21  4:03 AM  Result Value Ref Range   WBC 9.5 4.0 - 10.5 K/uL   RBC 4.44 3.87 - 5.11 MIL/uL   Hemoglobin 12.3 12.0 - 15.0 g/dL   HCT 35.5 (L) 36.0 - 46.0 %   MCV 80.0 80.0 - 100.0 fL   MCH 27.7 26.0 - 34.0 pg   MCHC 34.6 30.0 - 36.0 g/dL   RDW 13.9 11.5 - 15.5 %   Platelets 177 150 - 400 K/uL    Comment: SPECIMEN CHECKED FOR CLOTS REPEATED TO VERIFY PLATELET COUNT CONFIRMED BY SMEAR    nRBC 0.0 0.0 - 0.2 %   Neutrophils Relative % 57 %   Neutro Abs 5.5 1.7 - 7.7 K/uL   Lymphocytes Relative 29 %   Lymphs Abs 2.8 0.7 - 4.0 K/uL   Monocytes Relative 12 %   Monocytes Absolute 1.1 (H) 0.1 - 1.0 K/uL   Eosinophils Relative 1 %   Eosinophils Absolute 0.1 0.0 - 0.5 K/uL   Basophils Relative 0 %   Basophils Absolute 0.0 0.0 - 0.1 K/uL   Immature Granulocytes 1 %   Abs Immature Granulocytes 0.05 0.00 - 0.07 K/uL    Comment: Performed at [facility], [place] [address], [place], [place] [address]  Comprehensive metabolic panel     Status: Abnormal   Collection Time: 01/08/21  4:03 AM  Result Value Ref Range   Sodium 138 135 - 145 mmol/L   Potassium 3.2 (L) 3.5 - 5.1 mmol/L   Chloride 105 98 - 111 mmol/L   CO2 27 22 - 32 mmol/L   Glucose, Bld 222 (H) 70 - 99 mg/dL    Comment: Glucose reference range applies only to samples taken after fasting for at least 8 hours.   BUN 14 8 - 23 mg/dL  Creatinine, Ser 0.78 0.44 - 1.00 mg/dL   Calcium 8.7 (L) 8.9 - 10.3 mg/dL   Total Protein 6.1 (L) 6.5 - 8.1 g/dL   Albumin 3.2 (L) 3.5 - 5.0 g/dL   AST 22 15 - 41 U/L   ALT 16 0 - 44 U/L   Alkaline Phosphatase 55 38 - 126 U/L   Total Bilirubin 0.5 0.3 - 1.2 mg/dL   GFR, Estimated >60 >60 mL/min    Comment: (NOTE) Calculated using the CKD-EPI Creatinine Equation  (2021)    Anion gap 6 5 - 15    Comment: Performed at [facility], [place] [address], [place], [place] [address]  Procalcitonin     Status: None   Collection Time: 01/08/21  4:03 AM  Result Value Ref Range   Procalcitonin <0.10 ng/mL    Comment:        Interpretation: PCT (Procalcitonin) <= 0.5 ng/mL: Systemic infection (sepsis) is not likely. Local bacterial infection is possible. (NOTE)       Sepsis PCT Algorithm           Lower Respiratory Tract                                      Infection PCT Algorithm    ----------------------------     ----------------------------         PCT < 0.25 ng/mL                PCT < 0.10 ng/mL          Strongly encourage             Strongly discourage   discontinuation of antibiotics    initiation of antibiotics    ----------------------------     -----------------------------       PCT 0.25 - 0.50 ng/mL            PCT 0.10 - 0.25 ng/mL               OR       >80% decrease in PCT            Discourage initiation of                                            antibiotics      Encourage discontinuation           of antibiotics    ----------------------------     -----------------------------         PCT >= 0.50 ng/mL              PCT 0.26 - 0.50 ng/mL               AND        <80% decrease in PCT             Encourage initiation of                                             antibiotics       Encourage continuation           of antibiotics    ----------------------------     -----------------------------  PCT >= 0.50 ng/mL                  PCT > 0.50 ng/mL               AND         increase in PCT                  Strongly encourage                                      initiation of antibiotics    Strongly encourage escalation           of antibiotics                                     -----------------------------                                           PCT <= 0.25 ng/mL                                                  OR                                        > 80% decrease in PCT                                      Discontinue / Do not initiate                                             antibiotics  Performed at [facility] [address], [place], [place] [address]   Hemoglobin A1c     Status: Abnormal   Collection Time: 01/08/21  4:03 AM  Result Value Ref Range   Hgb A1c MFr Bld 10.7 (H) 4.8 - 5.6 %    Comment: (NOTE) Pre diabetes:          5.7%-6.4%  Diabetes:              >6.4%  Glycemic control for   <7.0% adults with diabetes    Mean Plasma Glucose 260.39 mg/dL    Comment: Performed at [place] [address], [place], [place] [address]  CBG monitoring, ED     Status: Abnormal   Collection Time: 01/08/21  4:55 AM  Result Value Ref Range   Glucose-Capillary 236 (H) 70 - 99 mg/dL    Comment: Glucose reference range applies only to samples taken after fasting for at least 8 hours.  CBG monitoring, ED     Status: Abnormal   Collection Time: 01/08/21  7:35 AM  Result Value Ref Range   Glucose-Capillary 240 (H) 70 - 99 mg/dL    Comment: Glucose reference range applies  only to samples taken after fasting for at least 8 hours.  CBG monitoring, ED     Status: Abnormal   Collection Time: 01/08/21 11:55 AM  Result Value Ref Range   Glucose-Capillary 290 (H) 70 - 99 mg/dL    Comment: Glucose reference range applies only to samples taken after fasting for at least 8 hours.   DG Chest Portable 1 View  Result Date: 01/07/2021 CLINICAL DATA:  Cough and fever EXAM: PORTABLE CHEST 1 VIEW COMPARISON:  03/22/2019 FINDINGS: Airspace disease at the left lung base with possible small effusion. Stable cardiomediastinal silhouette with aortic atherosclerosis. No pneumothorax. IMPRESSION: Airspace disease at left lung base concerning for a pneumonia. Possible small left effusion. Radiographic follow-up to resolution is recommended Electronically Signed   By: Donavan Foil M.D.   On: 01/07/2021 22:09    Pending Labs Unresulted Labs (From admission, onward)     Start     Ordered   01/08/21 0835  Magnesium  Add-on,   AD        01/08/21 0834   01/08/21 0500  Procalcitonin  Daily,   R      01/08/21 0340   01/08/21 0341  HIV Antibody (routine testing w rflx)  (HIV Antibody (Routine testing w reflex) panel)  Add-on,   AD        01/08/21 0340            Vitals/Pain Today's Vitals   01/08/21 0800 01/08/21 1000 01/08/21 1030 01/08/21 1202  BP: (!) 162/72 (!) 165/86  128/76  Pulse: 69 66 77 88  Resp: 15 12 18 18   Temp:    98 F (36.7 C)  TempSrc:    Oral  SpO2: 97% 100% 100% 100%  Weight:      Height:      PainSc:    0-No pain    Isolation Precautions No active isolations  Medications Medications  amLODipine (NORVASC) tablet 5 mg (5 mg Oral Given 01/08/21 0906)  atorvastatin (LIPITOR) tablet 80 mg (80 mg Oral Given 01/08/21 0907)  donepezil (ARICEPT) tablet 10 mg (has no administration in time range)  levothyroxine (SYNTHROID) tablet 137 mcg (0 mcg Oral Hold 01/08/21 0939)  pantoprazole (PROTONIX) EC tablet 40 mg (40 mg Oral Given 01/08/21 0910)  clopidogrel (PLAVIX) tablet 75 mg (0 mg Oral Hold 01/08/21 0939)  enoxaparin (LOVENOX) injection 40 mg (40 mg Subcutaneous Given 01/08/21 0907)  albuterol (PROVENTIL) (2.5 MG/3ML) 0.083% nebulizer solution 2.5 mg (has no administration in time range)  ondansetron (ZOFRAN) tablet 4 mg (has no administration in time range)    Or  ondansetron (ZOFRAN) injection 4 mg (has no administration in time range)  acetaminophen (TYLENOL) tablet 650 mg (has no administration in time range)    Or  acetaminophen (TYLENOL) suppository 650 mg (has no administration in time range)  insulin aspart (novoLOG) injection 0-15 Units (8 Units Subcutaneous Given 01/08/21 1205)  insulin aspart (novoLOG) injection 0-5 Units (has no administration in time range)  azithromycin (ZITHROMAX) tablet 500 mg (500 mg Oral  Given 01/08/21 0907)  cefTRIAXone (ROCEPHIN) 2 g in sodium chloride 0.9 % 100 mL IVPB (has no administration in time range)  lactated ringers bolus 1,000 mL (0 mLs Intravenous Stopped 01/07/21 2324)  cefTRIAXone (ROCEPHIN) 1 g in sodium chloride 0.9 % 100 mL IVPB (0 g Intravenous Stopped 01/08/21 0000)  azithromycin (ZITHROMAX) 500 mg in sodium chloride 0.9 % 250 mL IVPB (0 mg Intravenous Stopped 01/08/21 0211)  insulin aspart (novoLOG) injection 10 Units (  10 Units Intravenous Given 01/07/21 2357)  potassium chloride SA (KLOR-CON M) CR tablet 40 mEq (40 mEq Oral Given 01/08/21 1206)    Mobility walks with device High fall risk      R Recommendations: See Admitting Provider Note  Report given to:   Additional Notes:

## 2021-06-01 ENCOUNTER — Emergency Department (HOSPITAL_COMMUNITY): Payer: Medicare (Managed Care)

## 2021-06-01 ENCOUNTER — Emergency Department (HOSPITAL_COMMUNITY)
Admission: EM | Admit: 2021-06-01 | Discharge: 2021-06-01 | Disposition: A | Discharge status: Home / Self Care | Payer: Medicare (Managed Care) | Attending: Emergency Medicine | Admitting: Emergency Medicine

## 2021-06-01 ENCOUNTER — Telehealth (HOSPITAL_COMMUNITY): Payer: Self-pay | Admitting: Emergency Medicine

## 2021-06-01 ENCOUNTER — Encounter (HOSPITAL_COMMUNITY): Payer: Self-pay | Admitting: Emergency Medicine

## 2021-06-01 DIAGNOSIS — R0789 Other chest pain: Secondary | ICD-10-CM | POA: Diagnosis not present

## 2021-06-01 DIAGNOSIS — I10 Essential (primary) hypertension: Secondary | ICD-10-CM | POA: Diagnosis not present

## 2021-06-01 DIAGNOSIS — E039 Hypothyroidism, unspecified: Secondary | ICD-10-CM | POA: Diagnosis not present

## 2021-06-01 DIAGNOSIS — R109 Unspecified abdominal pain: Secondary | ICD-10-CM

## 2021-06-01 DIAGNOSIS — Z79899 Other long term (current) drug therapy: Secondary | ICD-10-CM | POA: Diagnosis not present

## 2021-06-01 DIAGNOSIS — Z794 Long term (current) use of insulin: Secondary | ICD-10-CM | POA: Insufficient documentation

## 2021-06-01 DIAGNOSIS — R1013 Epigastric pain: Secondary | ICD-10-CM | POA: Diagnosis present

## 2021-06-01 DIAGNOSIS — K5900 Constipation, unspecified: Secondary | ICD-10-CM | POA: Insufficient documentation

## 2021-06-01 DIAGNOSIS — R079 Chest pain, unspecified: Secondary | ICD-10-CM

## 2021-06-01 DIAGNOSIS — R110 Nausea: Secondary | ICD-10-CM | POA: Insufficient documentation

## 2021-06-01 DIAGNOSIS — E119 Type 2 diabetes mellitus without complications: Secondary | ICD-10-CM | POA: Diagnosis not present

## 2021-06-01 DIAGNOSIS — Z7984 Long term (current) use of oral hypoglycemic drugs: Secondary | ICD-10-CM | POA: Diagnosis not present

## 2021-06-01 LAB — URINALYSIS, ROUTINE W REFLEX MICROSCOPIC
Bacteria, UA: NONE SEEN
Bilirubin Urine: NEGATIVE
Glucose, UA: 150 mg/dL — AB
Hgb urine dipstick: NEGATIVE
Ketones, ur: NEGATIVE mg/dL
Leukocytes,Ua: NEGATIVE
Nitrite: NEGATIVE
Protein, ur: 30 mg/dL — AB
Specific Gravity, Urine: 1.012 (ref 1.005–1.030)
pH: 6 (ref 5.0–8.0)

## 2021-06-01 LAB — COMPREHENSIVE METABOLIC PANEL
ALT: 16 U/L (ref 0–44)
AST: 16 U/L (ref 15–41)
Albumin: 3.2 g/dL — ABNORMAL LOW (ref 3.5–5.0)
Alkaline Phosphatase: 61 U/L (ref 38–126)
Anion gap: 7 (ref 5–15)
BUN: 12 mg/dL (ref 8–23)
CO2: 25 mmol/L (ref 22–32)
Calcium: 9.3 mg/dL (ref 8.9–10.3)
Chloride: 106 mmol/L (ref 98–111)
Creatinine, Ser: 0.86 mg/dL (ref 0.44–1.00)
GFR, Estimated: >60 mL/min (ref >60)
Glucose, Bld: 183 mg/dL — ABNORMAL HIGH (ref 70–99)
Potassium: 3.4 mmol/L — ABNORMAL LOW (ref 3.5–5.1)
Sodium: 138 mmol/L (ref 135–145)
Total Bilirubin: 0.3 mg/dL (ref 0.3–1.2)
Total Protein: 6.3 g/dL — ABNORMAL LOW (ref 6.5–8.1)

## 2021-06-01 LAB — CBC
HCT: 35.8 % — ABNORMAL LOW (ref 36.0–46.0)
Hemoglobin: 11.9 g/dL — ABNORMAL LOW (ref 12.0–15.0)
MCH: 27.7 pg (ref 26.0–34.0)
MCHC: 33.2 g/dL (ref 30.0–36.0)
MCV: 83.4 fL (ref 80.0–100.0)
Platelets: 225 10*3/uL (ref 150–400)
RBC: 4.29 MIL/uL (ref 3.87–5.11)
RDW: 13.5 % (ref 11.5–15.5)
WBC: 9.5 10*3/uL (ref 4.0–10.5)
nRBC: 0 % (ref 0.0–0.2)

## 2021-06-01 LAB — TROPONIN I (HIGH SENSITIVITY)
Troponin I (High Sensitivity): 4 ng/L (ref <18)
Troponin I (High Sensitivity): 4 ng/L (ref <18)

## 2021-06-01 LAB — LIPASE, BLOOD: Lipase: 33 U/L (ref 11–51)

## 2021-06-01 MED ORDER — SUCRALFATE 1 G PO TABS: 1.0000 g | ORAL_TABLET | Freq: Three times a day (TID) | ORAL | 0 refills | Status: DC

## 2021-06-01 MED ORDER — ALUM & MAG HYDROXIDE-SIMETH 200-200-20 MG/5ML PO SUSP
30.0000 mL | Freq: Once | ORAL | Status: AC
  Administered 2021-06-01: 30 mL via ORAL
  Filled 2021-06-01: qty 30

## 2021-06-01 MED ORDER — POTASSIUM CHLORIDE CRYS ER 20 MEQ PO TBCR
40.0000 meq | EXTENDED_RELEASE_TABLET | Freq: Once | ORAL | Status: AC
  Administered 2021-06-01: 40 meq via ORAL
  Filled 2021-06-01: qty 2

## 2021-06-01 MED ORDER — POLYETHYLENE GLYCOL 3350 17 GM/SCOOP PO POWD: 17.0000 g | Freq: Every day | ORAL | 0 refills | Status: AC

## 2021-06-01 MED ORDER — FAMOTIDINE IN NACL 20-0.9 MG/50ML-% IV SOLN
20.0000 mg | Freq: Once | INTRAVENOUS | Status: AC
  Administered 2021-06-01: 20 mg via INTRAVENOUS
  Filled 2021-06-01: qty 50

## 2021-06-01 MED ORDER — LIDOCAINE VISCOUS HCL 2 % MT SOLN
15.0000 mL | Freq: Once | OROMUCOSAL | Status: AC
  Administered 2021-06-01: 15 mL via ORAL
  Filled 2021-06-01: qty 15

## 2021-06-01 NOTE — ED Provider Notes (Signed)
[facility] EMERGENCY DEPARTMENT Provider Note   CSN: [id number] Arrival date & time: 06/01/21  0037     History  Chief Complaint  Patient presents with   Nausea   Generalized Body Aches         Allison [name] is a 87 y.o. female.  Patient as above with significant medical history as below, including CVA, diabetes, hyperlipidemia, hypertension, hypothyroid who presents to the ED with complaint of chest pain, epigastric pain, nausea.  Patient reports symptoms woke her from sleep around 2 hours ago.  She was having epigastric discomfort, chest pain, mild dyspnea, nausea without emesis. Symptoms have been progressively improving since the onset.  She is no longer having chest pain.  Chest pain subsided around 1 hour ago.  Chest pain was described as tightness, pressure, sharp.  Midsternal.  Patient does report over the past few days she had some diet changes, been eating at multiple cookouts and eating food that she does not typically eat.  Has been taking her home medication as prescribed.  Positive sick contact in the past 24 hours URI type symptoms.     Past Medical History:  Diagnosis Date   Acute ischemic stroke ([place]) 10/29/2015   Carpal tunnel syndrome    Dementia (HCC)    Diabetes mellitus without complication (HCC)    Dysphagia    GERD (gastroesophageal reflux disease)    Hiatal hernia    HLD (hyperlipidemia)    Hx of adenomatous colonic polyps 06/19/2014   Hyperlipidemia    Hypertension    Hypothyroidism    Onychomycosis 03/05/2012   Pain in lower limb 04/18/2013   Sciatic nerve pain    Vertigo     Past Surgical History:  Procedure Laterality Date   BLADDER SURGERY     Mesh implant   COLONOSCOPY     LARYNX SURGERY     vocal cords   PARTIAL HYSTERECTOMY  1978   VESICOVAGINAL FISTULA CLOSURE W/ TAH  1976     The history is provided by the patient and a relative. No language interpreter was used.      Home Medications Prior to Admission  medications   Medication Sig Start Date End Date Taking? Authorizing Provider  polyethylene glycol powder (MIRALAX) 17 GM/SCOOP powder Take 17 g by mouth daily for 5 days. 06/01/21 06/06/21 Yes Wynona Dove A, DO  sucralfate (CARAFATE) 1 g tablet Take 1 tablet (1 g total) by mouth 4 (four) times daily -  with meals and at bedtime for 7 days. 06/01/21 06/08/21 Yes Wynona Dove A, DO  amLODipine (NORVASC) 5 MG tablet TAKE ONE TABLET BY MOUTH DAILY Patient taking differently: Take 5 mg by mouth daily. 03/11/18   Lauree Chandler, NP  ascorbic acid (VITAMIN C) 500 MG tablet Take 500 mg by mouth daily. X 10 days 01/03/21   [provider]  ASSURE LANCE LANCETS 21G MISC 1 each by Does not apply route 4 (four) times daily. DX E11.65 01/04/18   Hendricks Limes, MD  atorvastatin (LIPITOR) 40 MG tablet Take 2 tablets (80 mg total) by mouth daily. 01/08/21   Mercy Riding, MD  blood glucose meter kit and supplies Dispense based on patient and insurance preference. Use up to four times daily as directed. (FOR ICD-10 E10.9, E11.9). 01/08/21   Mercy Riding, MD  Blood Glucose Monitoring Suppl (ONETOUCH VERIO) w/Device KIT 1 each by Does not apply route 3 (three) times daily. Use to test blood sugar three  times daily. Dx: E11.21 06/15/17   Gildardo Cranker, DO  donepezil (ARICEPT) 10 MG tablet Take 1 tablet (10 mg total) by mouth at bedtime. For memory Patient not taking: Reported on 01/08/2021 03/10/18   Lauree Chandler, NP  Glucerna (GLUCERNA) LIQD Take 237 mLs by mouth 2 (two) times daily between meals.    [provider]  glucose blood (ONETOUCH VERIO) test strip Check blood sugar three times daily DX E11.21 (One Touch Verio IQ) 06/15/17   Gildardo Cranker, DO  insulin aspart (NOVOLOG) 100 UNIT/ML FlexPen Inject 7 Units into the skin 3 (three) times daily with meals. Hold if blood glucose less than 100 01/08/21   Gonfa, Taye T, MD  Insulin Pen Needle (PEN NEEDLES) 31G X 5 MM MISC 1 each by Does not  apply route as needed. Use when giving insulin injections. Dx: E11.9 01/08/21   Mercy Riding, MD  levothyroxine (SYNTHROID) 125 MCG tablet Take 125 mcg by mouth daily before breakfast.    [provider]  Melatonin 2.5 MG CHEW Chew 2.5 mg by mouth at bedtime.    [provider]  metFORMIN (GLUCOPHAGE) 1000 MG tablet Take 1 tablet (1,000 mg total) by mouth 2 (two) times daily with a meal. 01/08/21   Gonfa, Charlesetta Ivory, MD  OLANZapine (ZYPREXA) 2.5 MG tablet Take 2.5 mg by mouth at bedtime.    [provider]  pantoprazole (PROTONIX) 40 MG tablet Take 1 tablet (40 mg total) by mouth daily. Patient not taking: Reported on 01/08/2021 03/10/18   Lauree Chandler, NP  sennosides-docusate sodium (SENOKOT-S) 8.6-50 MG tablet Take 2 tablets by mouth daily as needed for constipation.    [provider]  zinc gluconate 50 MG tablet Take 50 mg by mouth daily.    [provider]      Allergies    Lantus [insulin glargine], Levemir [insulin detemir], Pineapple, Codeine, Invokana [canagliflozin], Penicillins, Sulfa antibiotics, Tradjenta [linagliptin], Vioxx [rofecoxib], and Exelon [rivastigmine]    Review of Systems   Review of Systems  Constitutional:  Positive for fatigue. Negative for chills and fever.  HENT:  Negative for facial swelling and trouble swallowing.   Eyes:  Negative for photophobia and visual disturbance.  Respiratory:  Positive for chest tightness. Negative for cough and shortness of breath.   Cardiovascular:  Positive for chest pain. Negative for palpitations.  Gastrointestinal:  Positive for abdominal pain and nausea. Negative for vomiting.  Endocrine: Positive for polyuria. Negative for polydipsia.  Genitourinary:  Negative for difficulty urinating and hematuria.  Musculoskeletal:  Negative for gait problem and joint swelling.  Skin:  Negative for pallor and rash.  Neurological:  Negative for syncope and headaches.  Psychiatric/Behavioral:   Negative for agitation and confusion.    Physical Exam Updated Vital Signs BP (!) 141/70   Pulse 78   Temp 98.2 F (36.8 C) (Oral)   Resp (!) 21   SpO2 97%  Physical Exam Vitals and nursing note reviewed.  Constitutional:      General: She is not in acute distress.    Appearance: Normal appearance.  HENT:     Head: Normocephalic and atraumatic.     Right Ear: External ear normal.     Left Ear: External ear normal.     Nose: Nose normal.     Mouth/Throat:     Mouth: Mucous membranes are moist.  Eyes:     General: No scleral icterus.       Right eye: No discharge.  Left eye: No discharge.  Cardiovascular:     Rate and Rhythm: Normal rate and regular rhythm.     Pulses: Normal pulses.     Heart sounds: Normal heart sounds.  Pulmonary:     Effort: Pulmonary effort is normal. No tachypnea, accessory muscle usage or respiratory distress.     Breath sounds: Normal breath sounds. No decreased breath sounds or wheezing.  Abdominal:     General: Abdomen is flat.     Palpations: Abdomen is soft.     Tenderness: There is no abdominal tenderness. There is no guarding or rebound.  Musculoskeletal:        General: Normal range of motion.     Cervical back: Normal range of motion.     Right lower leg: No edema.     Left lower leg: No edema.  Skin:    General: Skin is warm and dry.     Capillary Refill: Capillary refill takes less than 2 seconds.  Neurological:     Mental Status: She is alert.  Psychiatric:        Mood and Affect: Mood normal.        Behavior: Behavior normal.    ED Results / Procedures / Treatments   Labs (all labs ordered are listed, but only abnormal results are displayed) Labs Reviewed  COMPREHENSIVE METABOLIC PANEL - Abnormal; Notable for the following components:      Result Value   Potassium 3.4 (*)    Glucose, Bld 183 (*)    Total Protein 6.3 (*)    Albumin 3.2 (*)    All other components within normal limits  CBC - Abnormal; Notable for  the following components:   Hemoglobin 11.9 (*)    HCT 35.8 (*)    All other components within normal limits  URINALYSIS, ROUTINE W REFLEX MICROSCOPIC - Abnormal; Notable for the following components:   Glucose, UA 150 (*)    Protein, ur 30 (*)    All other components within normal limits  LIPASE, BLOOD  TROPONIN I (HIGH SENSITIVITY)  TROPONIN I (HIGH SENSITIVITY)    EKG EKG Interpretation  Date/Time:  Sunday Jun 01 2021 03:36:43 EDT Ventricular Rate:  73 PR Interval:  183 QRS Duration: 87 QT Interval:  383 QTC Calculation: 422 R Axis:   -3 Text Interpretation: Sinus rhythm Confirmed by Wynona Dove ([id number]) on 06/01/2021 3:40:02 AM  Radiology DG Chest 2 View  Result Date: 06/01/2021 CLINICAL DATA:  Chest pain. EXAM: CHEST - 2 VIEW COMPARISON:  Chest radiograph dated 01/07/2021. FINDINGS: No focal consolidation, pleural effusion, pneumothorax. Stable cardiac silhouette. Atherosclerotic calcification of the aorta. No acute osseous pathology. Degenerative changes of spine. IMPRESSION: No active cardiopulmonary disease. Electronically Signed   By: Anner Crete M.D.   On: 06/01/2021 02:45    Procedures Procedures    Medications Ordered in ED Medications  famotidine (PEPCID) IVPB 20 mg premix (0 mg Intravenous Stopped 06/01/21 0326)  alum & mag hydroxide-simeth (MAALOX/MYLANTA) 200-200-20 MG/5ML suspension 30 mL (30 mLs Oral Given 06/01/21 0221)    And  lidocaine (XYLOCAINE) 2 % viscous mouth solution 15 mL (15 mLs Oral Given 06/01/21 0221)  potassium chloride SA (KLOR-CON M) CR tablet 40 mEq (40 mEq Oral Given 06/01/21 4742)    ED Course/ Medical Decision Making/ A&P Clinical Course as of 06/01/21 0740  Sun Jun 01, 2021  0450 HEART score is 4, no chest pain while in the ED [SG]    Clinical Course User Index [SG] Wynona Dove  A, DO                           Medical Decision Making Amount and/or Complexity of Data Reviewed Labs: ordered. Radiology: ordered. ECG/medicine  tests: ordered.  Risk OTC drugs. Prescription drug management.    CC: Chest pain, Donnell pain, nausea  This patient presents to the Emergency Department for the above complaint. This involves an extensive number of treatment options and is a complaint that carries with it a high risk of complications and morbidity. Vital signs were reviewed. Serious etiologies considered.  Differential includes all life-threatening causes for chest pain. This includes but is not exclusive to acute coronary syndrome, aortic dissection, pulmonary embolism, cardiac tamponade, community-acquired pneumonia, pericarditis, musculoskeletal chest wall pain, gastritis, UTI, enteritis, ETC..    Record review:  Previous records obtained and reviewed  Prior ED visits, prior labs and imaging, prior hospitalization, prior urgent care visit  Additional history obtained from sister at bedside  Medical and surgical history as noted above.   Work up as above, notable for:  Labs & imaging results that were available during my care of the patient were visualized by me and considered in my medical decision making.   I ordered imaging studies which included chest x-ray and I visualized/interpreted the imaging and I agree with radiologist interpretation. No acute process.   Cardiac monitoring reviewed and interpreted personally which shows NSR  Labs stable, trop neg x2.  Management: Give GI cocktail Patient does not currently have chest pain.    Reassessment:   The patient's chest pain is not suggestive of pulmonary embolus, cardiac ischemia, aortic dissection, pericarditis, myocarditis, pulmonary embolism, pneumothorax, pneumonia, Zoster, or esophageal perforation, or other serious etiology.  Historically not abrupt in onset, tearing or ripping, pulses symmetric. EKG nonspecific for ischemia/infarction. No dysrhythmias, brugada, WPW, prolonged QT noted.   Troponin negative x2. CXR reviewed. Labs without  demonstration of acute pathology unless otherwise noted above.   HEART score is 4, favor likely atypical chest pain at this time. Recommend pt f/u with her cardiologist  Given the extremely low risk of these diagnoses further testing and evaluation for these possibilities does not appear to be indicated at this time. Patient in no distress and overall condition improved here in the ED. Detailed discussions were had with the patient regarding current findings, and need for close f/u with PCP or on call doctor. The patient has been instructed to return immediately if the symptoms worsen in any way for re-evaluation. Patient verbalized understanding and is in agreement with current care plan. All questions answered prior to discharge.                  Social determinants of health include -  Social History   Socioeconomic History   Marital status: Single    Spouse name: Not on file   Number of children: 2   Years of education: Not on file   Highest education level: Not on file  Occupational History   Occupation: Retired  Tobacco Use   Smoking status: Never   Smokeless tobacco: Never  Vaping Use   Vaping Use: Never used  Substance and Sexual Activity   Alcohol use: No    Alcohol/week: 0.0 standard drinks   Drug use: No   Sexual activity: Never    Birth control/protection: Abstinence  Other Topics Concern   Not on file  Social History Narrative   Single   2 sons  Retired   2 cups caffeine daily   05/01/2014   Social Determinants of Health   Financial Resource Strain: Not on file  Food Insecurity: Not on file  Transportation Needs: Not on file  Physical Activity: Not on file  Stress: Not on file  Social Connections: Not on file  Intimate Partner Violence: Not on file      This chart was dictated using voice recognition software.  Despite best efforts to proofread,  errors can occur which can change the documentation meaning.         Final Clinical  Impression(s) / ED Diagnoses Final diagnoses:  Abdominal pain, unspecified abdominal location  Atypical chest pain  Constipation, unspecified constipation type    Rx / DC Orders ED Discharge Orders          Ordered    sucralfate (CARAFATE) 1 g tablet  3 times daily with meals & bedtime        06/01/21 0455    polyethylene glycol powder (MIRALAX) 17 GM/SCOOP powder  Daily        06/01/21 Yaak, Jolissa Kapral A, DO 06/01/21 0740

## 2021-06-01 NOTE — ED Notes (Signed)
Patient transported to X-ray 

## 2021-06-01 NOTE — Discharge Instructions (Addendum)

## 2021-06-01 NOTE — ED Notes (Signed)
Patient verbalizes understanding of d/c instructions. Opportunities for questions and answers were provided. Pt d/c from ED and wheeled to lobby where pt's sister is picking her up.

## 2021-06-01 NOTE — Telephone Encounter (Signed)
Cardiology referral placed

## 2021-06-01 NOTE — ED Triage Notes (Signed)
Pt c/o generalized bodyaches & nausea "x2-3 nights," but "couldn't take it anymore" tonight. Denies vomiting, diarrhea, fever/chills, SHOB, CP

## 2021-07-17 ENCOUNTER — Ambulatory Visit: Payer: Medicare (Managed Care) | Admitting: Interventional Cardiology

## 2021-11-09 ENCOUNTER — Emergency Department (HOSPITAL_COMMUNITY)
Admission: EM | Admit: 2021-11-09 | Discharge: 2021-11-09 | Disposition: A | Discharge status: Home / Self Care | Payer: Medicare (Managed Care) | Attending: Emergency Medicine | Admitting: Emergency Medicine

## 2021-11-09 ENCOUNTER — Encounter (HOSPITAL_COMMUNITY): Payer: Self-pay

## 2021-11-09 ENCOUNTER — Emergency Department (HOSPITAL_COMMUNITY): Payer: Medicare (Managed Care)

## 2021-11-09 ENCOUNTER — Other Ambulatory Visit: Payer: Self-pay

## 2021-11-09 DIAGNOSIS — Z794 Long term (current) use of insulin: Secondary | ICD-10-CM | POA: Insufficient documentation

## 2021-11-09 DIAGNOSIS — E119 Type 2 diabetes mellitus without complications: Secondary | ICD-10-CM | POA: Diagnosis not present

## 2021-11-09 DIAGNOSIS — F0390 Unspecified dementia without behavioral disturbance: Secondary | ICD-10-CM | POA: Diagnosis not present

## 2021-11-09 DIAGNOSIS — Z79899 Other long term (current) drug therapy: Secondary | ICD-10-CM | POA: Diagnosis not present

## 2021-11-09 DIAGNOSIS — I10 Essential (primary) hypertension: Secondary | ICD-10-CM | POA: Insufficient documentation

## 2021-11-09 DIAGNOSIS — R4182 Altered mental status, unspecified: Secondary | ICD-10-CM | POA: Insufficient documentation

## 2021-11-09 DIAGNOSIS — N390 Urinary tract infection, site not specified: Secondary | ICD-10-CM

## 2021-11-09 DIAGNOSIS — Z7984 Long term (current) use of oral hypoglycemic drugs: Secondary | ICD-10-CM | POA: Insufficient documentation

## 2021-11-09 LAB — COMPREHENSIVE METABOLIC PANEL
ALT: 13 U/L (ref 0–44)
AST: 14 U/L — ABNORMAL LOW (ref 15–41)
Albumin: 3.2 g/dL — ABNORMAL LOW (ref 3.5–5.0)
Alkaline Phosphatase: 57 U/L (ref 38–126)
Anion gap: 6 (ref 5–15)
BUN: 13 mg/dL (ref 8–23)
CO2: 27 mmol/L (ref 22–32)
Calcium: 9.1 mg/dL (ref 8.9–10.3)
Chloride: 107 mmol/L (ref 98–111)
Creatinine, Ser: 0.84 mg/dL (ref 0.44–1.00)
GFR, Estimated: >60 mL/min (ref >60)
Glucose, Bld: 225 mg/dL — ABNORMAL HIGH (ref 70–99)
Potassium: 3.3 mmol/L — ABNORMAL LOW (ref 3.5–5.1)
Sodium: 140 mmol/L (ref 135–145)
Total Bilirubin: 0.4 mg/dL (ref 0.3–1.2)
Total Protein: 6.6 g/dL (ref 6.5–8.1)

## 2021-11-09 LAB — CBC WITH DIFFERENTIAL/PLATELET
Abs Immature Granulocytes: 0.04 10*3/uL (ref 0.00–0.07)
Basophils Absolute: 0.1 10*3/uL (ref 0.0–0.1)
Basophils Relative: 1 %
Eosinophils Absolute: 0.2 10*3/uL (ref 0.0–0.5)
Eosinophils Relative: 2 %
HCT: 33.7 % — ABNORMAL LOW (ref 36.0–46.0)
Hemoglobin: 11 g/dL — ABNORMAL LOW (ref 12.0–15.0)
Immature Granulocytes: 1 %
Lymphocytes Relative: 22 %
Lymphs Abs: 1.8 10*3/uL (ref 0.7–4.0)
MCH: 26.3 pg (ref 26.0–34.0)
MCHC: 32.6 g/dL (ref 30.0–36.0)
MCV: 80.4 fL (ref 80.0–100.0)
Monocytes Absolute: 1.5 10*3/uL — ABNORMAL HIGH (ref 0.1–1.0)
Monocytes Relative: 19 %
Neutro Abs: 4.5 10*3/uL (ref 1.7–7.7)
Neutrophils Relative %: 55 %
Platelets: 207 10*3/uL (ref 150–400)
RBC: 4.19 MIL/uL (ref 3.87–5.11)
RDW: 14.3 % (ref 11.5–15.5)
WBC: 8.1 10*3/uL (ref 4.0–10.5)
nRBC: 0 % (ref 0.0–0.2)

## 2021-11-09 LAB — URINALYSIS, ROUTINE W REFLEX MICROSCOPIC
Bilirubin Urine: NEGATIVE
Glucose, UA: >=500 mg/dL — AB
Ketones, ur: NEGATIVE mg/dL
Nitrite: NEGATIVE
Protein, ur: 30 mg/dL — AB
Specific Gravity, Urine: 1.015 (ref 1.005–1.030)
pH: 6 (ref 5.0–8.0)

## 2021-11-09 LAB — URINALYSIS, MICROSCOPIC (REFLEX)

## 2021-11-09 MED ORDER — SODIUM CHLORIDE 0.9 % IV BOLUS
1000.0000 mL | Freq: Once | INTRAVENOUS | Status: AC
  Administered 2021-11-09: 1000 mL via INTRAVENOUS

## 2021-11-09 MED ORDER — SODIUM CHLORIDE 0.9 % IV BOLUS
500.0000 mL | Freq: Once | INTRAVENOUS | Status: AC
  Administered 2021-11-09: 500 mL via INTRAVENOUS

## 2021-11-09 MED ORDER — CEPHALEXIN 500 MG PO CAPS
500.0000 mg | ORAL_CAPSULE | Freq: Once | ORAL | Status: AC
  Administered 2021-11-09: 500 mg via ORAL
  Filled 2021-11-09: qty 1

## 2021-11-09 MED ORDER — CEPHALEXIN 500 MG PO CAPS: 500.0000 mg | ORAL_CAPSULE | Freq: Three times a day (TID) | ORAL | 0 refills | Status: DC

## 2021-11-09 NOTE — ED Provider Notes (Signed)
[facility] DEPT Provider Note   CSN: [id number] Arrival date & time: 11/09/21  7253     History  Chief Complaint  Patient presents with   Altered Mental Status    Allison [name] is a 87 y.o. female.  Patient is an 87 year old female with past medical history of dementia, diabetes, prior CVA, hypertension.  Patient presenting today with complaints of altered mental status.  Per EMS report, family reports patient has been more confused this evening.  She was apparently talking to her who passed away 10 years ago.  Family also concerned about a dry cough and foul-smelling urine.  Patient denies to me that she is having any discomfort.  She reports 2 coughing episodes this afternoon, but denies any fevers, chills, or chest pain.  The history is provided by the patient.       Home Medications Prior to Admission medications   Medication Sig Start Date End Date Taking? Authorizing Provider  amLODipine (NORVASC) 5 MG tablet TAKE ONE TABLET BY MOUTH DAILY Patient taking differently: Take 5 mg by mouth daily. 03/11/18   Lauree Chandler, NP  ascorbic acid (VITAMIN C) 500 MG tablet Take 500 mg by mouth daily. X 10 days 01/03/21   [provider]  ASSURE LANCE LANCETS 21G MISC 1 each by Does not apply route 4 (four) times daily. DX E11.65 01/04/18   Hendricks Limes, MD  atorvastatin (LIPITOR) 40 MG tablet Take 2 tablets (80 mg total) by mouth daily. 01/08/21   Mercy Riding, MD  blood glucose meter kit and supplies Dispense based on patient and insurance preference. Use up to four times daily as directed. (FOR ICD-10 E10.9, E11.9). 01/08/21   Mercy Riding, MD  Blood Glucose Monitoring Suppl (ONETOUCH VERIO) w/Device KIT 1 each by Does not apply route 3 (three) times daily. Use to test blood sugar three times daily. Dx: E11.21 06/15/17   Gildardo Cranker, DO  donepezil (ARICEPT) 10 MG tablet Take 1 tablet (10 mg total) by mouth at bedtime. For  memory Patient not taking: Reported on 01/08/2021 03/10/18   Lauree Chandler, NP  Glucerna (GLUCERNA) LIQD Take 237 mLs by mouth 2 (two) times daily between meals.    [provider]  glucose blood (ONETOUCH VERIO) test strip Check blood sugar three times daily DX E11.21 (One Touch Verio IQ) 06/15/17   Gildardo Cranker, DO  insulin aspart (NOVOLOG) 100 UNIT/ML FlexPen Inject 7 Units into the skin 3 (three) times daily with meals. Hold if blood glucose less than 100 01/08/21   Gonfa, Taye T, MD  Insulin Pen Needle (PEN NEEDLES) 31G X 5 MM MISC 1 each by Does not apply route as needed. Use when giving insulin injections. Dx: E11.9 01/08/21   Mercy Riding, MD  levothyroxine (SYNTHROID) 125 MCG tablet Take 125 mcg by mouth daily before breakfast.    [provider]  Melatonin 2.5 MG CHEW Chew 2.5 mg by mouth at bedtime.    [provider]  metFORMIN (GLUCOPHAGE) 1000 MG tablet Take 1 tablet (1,000 mg total) by mouth 2 (two) times daily with a meal. 01/08/21   Gonfa, Charlesetta Ivory, MD  OLANZapine (ZYPREXA) 2.5 MG tablet Take 2.5 mg by mouth at bedtime.    [provider]  pantoprazole (PROTONIX) 40 MG tablet Take 1 tablet (40 mg total) by mouth daily. Patient not taking: Reported on 01/08/2021 03/10/18   Lauree Chandler, NP  sennosides-docusate sodium (SENOKOT-S) 8.6-50 MG tablet  Take 2 tablets by mouth daily as needed for constipation.    [provider]  sucralfate (CARAFATE) 1 g tablet Take 1 tablet (1 g total) by mouth 4 (four) times daily -  with meals and at bedtime for 7 days. 06/01/21 06/08/21  Wynona Dove A, DO  zinc gluconate 50 MG tablet Take 50 mg by mouth daily.    [provider]      Allergies    Lantus [insulin glargine], Levemir [insulin detemir], Pineapple, Codeine, Invokana [canagliflozin], Penicillins, Sulfa antibiotics, Tradjenta [linagliptin], Vioxx [rofecoxib], and Exelon [rivastigmine]    Review of Systems   Review of Systems   All other systems reviewed and are negative.   Physical Exam Updated Vital Signs BP 133/74 (BP Location: Right Arm)   Pulse 95   Temp 98.3 F (36.8 C) (Oral)   Resp 20   SpO2 99%  Physical Exam Vitals and nursing note reviewed.  Constitutional:      General: She is not in acute distress.    Appearance: She is well-developed. She is not diaphoretic.  HENT:     Head: Normocephalic and atraumatic.  Cardiovascular:     Rate and Rhythm: Normal rate and regular rhythm.     Heart sounds: No murmur heard.    No friction rub. No gallop.  Pulmonary:     Effort: Pulmonary effort is normal. No respiratory distress.     Breath sounds: Normal breath sounds. No wheezing.  Abdominal:     General: Bowel sounds are normal. There is no distension.     Palpations: Abdomen is soft.     Tenderness: There is no abdominal tenderness.  Musculoskeletal:        General: Normal range of motion.     Cervical back: Normal range of motion and neck supple.  Skin:    General: Skin is warm and dry.  Neurological:     General: No focal deficit present.     Mental Status: She is alert.     Comments: Patient is awake and alert.  She is oriented to place and situation.     ED Results / Procedures / Treatments   Labs (all labs ordered are listed, but only abnormal results are displayed) Labs Reviewed  COMPREHENSIVE METABOLIC PANEL  CBC WITH DIFFERENTIAL/PLATELET  URINALYSIS, ROUTINE W REFLEX MICROSCOPIC    EKG None  Radiology No results found.  Procedures Procedures    Medications Ordered in ED Medications  sodium chloride 0.9 % bolus 1,000 mL (has no administration in time range)    ED Course/ Medical Decision Making/ A&P  Patient is an 87 year old female presenting with complaints of mental status change.  According to family and reported to EMS is that patient was speaking with her dead mother yesterday evening.  She has also been having what they described as foul-smelling urine and  concern over UTI.  Patient arrives here afebrile with stable vital signs.  There is no hypoxia.  She is clinically well-appearing and appropriate.  Her physical examination is unremarkable.  Work-up initiated including CBC, metabolic panel, and urinalysis.  CBC and metabolic panel are unremarkable, but urinalysis is subjective of UTI.  Patient also underwent CT scan of the head which was negative and chest x-ray showing no acute process.  Patient to be given oral Keflex, but seems appropriate for discharge.  Patient is awake, alert, and appropriate.  She is nontoxic in appearance and I feel can safely be discharged.  I attempted to call the patient's listed  contact, Dellis Filbert, however received no answer when I dialed this number.  I also attempted to call the patient's listed home phone number and also received no answer.  Final Clinical Impression(s) / ED Diagnoses Final diagnoses:  None    Rx / DC Orders ED Discharge Orders     None         Veryl Speak, MD 11/09/21 [id number]

## 2021-11-09 NOTE — Discharge Instructions (Signed)
Begin taking Keflex as prescribed.  Continue other medications as previously prescribed.  Return to the ER if symptoms significantly worsen or change.

## 2021-11-09 NOTE — ED Triage Notes (Signed)
Family states that the patient has been more altered tonight than normal; states that she was talking to her dead mother, and has never had a episode like this before although she has a hx of dementia. Family states that she has a dry cough and some foul smelling urine.

## 2021-12-15 ENCOUNTER — Ambulatory Visit
Admission: RE | Admit: 2021-12-15 | Discharge: 2021-12-15 | Disposition: A | Discharge status: Home / Self Care | Payer: Medicare (Managed Care) | Source: Ambulatory Visit | Attending: Internal Medicine | Admitting: Internal Medicine

## 2021-12-15 ENCOUNTER — Other Ambulatory Visit: Payer: Self-pay | Admitting: Internal Medicine

## 2021-12-15 DIAGNOSIS — M25552 Pain in left hip: Secondary | ICD-10-CM

## 2022-01-10 ENCOUNTER — Emergency Department (HOSPITAL_COMMUNITY)
Admission: EM | Admit: 2022-01-10 | Discharge: 2022-01-10 | Disposition: A | Discharge status: Home / Self Care | Payer: Medicare (Managed Care) | Attending: Emergency Medicine | Admitting: Emergency Medicine

## 2022-01-10 ENCOUNTER — Other Ambulatory Visit: Payer: Self-pay

## 2022-01-10 ENCOUNTER — Encounter (HOSPITAL_COMMUNITY): Payer: Self-pay | Admitting: Emergency Medicine

## 2022-01-10 ENCOUNTER — Emergency Department (HOSPITAL_COMMUNITY): Payer: Medicare (Managed Care)

## 2022-01-10 DIAGNOSIS — Z7984 Long term (current) use of oral hypoglycemic drugs: Secondary | ICD-10-CM | POA: Diagnosis not present

## 2022-01-10 DIAGNOSIS — K2100 Gastro-esophageal reflux disease with esophagitis, without bleeding: Secondary | ICD-10-CM | POA: Diagnosis not present

## 2022-01-10 DIAGNOSIS — Z794 Long term (current) use of insulin: Secondary | ICD-10-CM | POA: Diagnosis not present

## 2022-01-10 DIAGNOSIS — M167 Other unilateral secondary osteoarthritis of hip: Secondary | ICD-10-CM | POA: Insufficient documentation

## 2022-01-10 DIAGNOSIS — R109 Unspecified abdominal pain: Secondary | ICD-10-CM | POA: Diagnosis present

## 2022-01-10 DIAGNOSIS — E119 Type 2 diabetes mellitus without complications: Secondary | ICD-10-CM | POA: Diagnosis not present

## 2022-01-10 LAB — CBC WITH DIFFERENTIAL/PLATELET
Abs Immature Granulocytes: 0.04 10*3/uL (ref 0.00–0.07)
Basophils Absolute: 0.1 10*3/uL (ref 0.0–0.1)
Basophils Relative: 1 %
Eosinophils Absolute: 0.3 10*3/uL (ref 0.0–0.5)
Eosinophils Relative: 3 %
HCT: 35.5 % — ABNORMAL LOW (ref 36.0–46.0)
Hemoglobin: 11.7 g/dL — ABNORMAL LOW (ref 12.0–15.0)
Immature Granulocytes: 0 %
Lymphocytes Relative: 33 %
Lymphs Abs: 3 10*3/uL (ref 0.7–4.0)
MCH: 26.5 pg (ref 26.0–34.0)
MCHC: 33 g/dL (ref 30.0–36.0)
MCV: 80.5 fL (ref 80.0–100.0)
Monocytes Absolute: 0.9 10*3/uL (ref 0.1–1.0)
Monocytes Relative: 10 %
Neutro Abs: 5 10*3/uL (ref 1.7–7.7)
Neutrophils Relative %: 53 %
Platelets: 266 10*3/uL (ref 150–400)
RBC: 4.41 MIL/uL (ref 3.87–5.11)
RDW: 14.8 % (ref 11.5–15.5)
WBC: 9.3 10*3/uL (ref 4.0–10.5)
nRBC: 0 % (ref 0.0–0.2)

## 2022-01-10 LAB — COMPREHENSIVE METABOLIC PANEL
ALT: 16 U/L (ref 0–44)
AST: 17 U/L (ref 15–41)
Albumin: 3.8 g/dL (ref 3.5–5.0)
Alkaline Phosphatase: 58 U/L (ref 38–126)
Anion gap: 6 (ref 5–15)
BUN: 8 mg/dL (ref 8–23)
CO2: 27 mmol/L (ref 22–32)
Calcium: 9.3 mg/dL (ref 8.9–10.3)
Chloride: 111 mmol/L (ref 98–111)
Creatinine, Ser: 0.72 mg/dL (ref 0.44–1.00)
GFR, Estimated: >60 mL/min (ref >60)
Glucose, Bld: 100 mg/dL — ABNORMAL HIGH (ref 70–99)
Potassium: 3.8 mmol/L (ref 3.5–5.1)
Sodium: 144 mmol/L (ref 135–145)
Total Bilirubin: 0.6 mg/dL (ref 0.3–1.2)
Total Protein: 6.9 g/dL (ref 6.5–8.1)

## 2022-01-10 LAB — TROPONIN I (HIGH SENSITIVITY)
Troponin I (High Sensitivity): 3 ng/L (ref <18)
Troponin I (High Sensitivity): 3 ng/L (ref <18)

## 2022-01-10 MED ORDER — HYDROCODONE-ACETAMINOPHEN 5-325 MG PO TABS
1.0000 | ORAL_TABLET | Freq: Once | ORAL | Status: AC
  Administered 2022-01-10: 1 via ORAL
  Filled 2022-01-10: qty 1

## 2022-01-10 MED ORDER — HYDROCODONE-ACETAMINOPHEN 5-325 MG PO TABS: ORAL_TABLET | ORAL | 0 refills | Status: DC

## 2022-01-10 MED ORDER — PANTOPRAZOLE SODIUM 20 MG PO TBEC: 20.0000 mg | DELAYED_RELEASE_TABLET | Freq: Every day | ORAL | 1 refills | Status: AC

## 2022-01-10 NOTE — ED Provider Notes (Incomplete)
[facility] DEPT Provider Note   CSN: [id number] Arrival date & time: 01/10/22  1424     History {Add pertinent medical, surgical, social history, OB history to HPI:1} Chief Complaint  Patient presents with   Abdominal Pain   Hip Pain    Allison [name] is a 88 y.o. female.  Patient states she has been having pain in her left hip and also complains of epigastric discomfort.  Patient has a history of diabetes   Abdominal Pain Hip Pain Associated symptoms include abdominal pain.       Home Medications Prior to Admission medications   Medication Sig Start Date End Date Taking? Authorizing Provider  HYDROcodone-acetaminophen (NORCO/VICODIN) 5-325 MG tablet Take 1 every 8 hours for pain not helped by Tylenol alone 01/10/22  Yes Milton Ferguson, MD  pantoprazole (PROTONIX) 20 MG tablet Take 1 tablet (20 mg total) by mouth daily. 01/10/22  Yes Milton Ferguson, MD  amLODipine (NORVASC) 5 MG tablet TAKE ONE TABLET BY MOUTH DAILY Patient taking differently: Take 5 mg by mouth daily. 03/11/18   Lauree Chandler, NP  ascorbic acid (VITAMIN C) 500 MG tablet Take 500 mg by mouth daily. X 10 days 01/03/21   [provider]  ASSURE LANCE LANCETS 21G MISC 1 each by Does not apply route 4 (four) times daily. DX E11.65 01/04/18   Hendricks Limes, MD  atorvastatin (LIPITOR) 40 MG tablet Take 2 tablets (80 mg total) by mouth daily. 01/08/21   Mercy Riding, MD  blood glucose meter kit and supplies Dispense based on patient and insurance preference. Use up to four times daily as directed. (FOR ICD-10 E10.9, E11.9). 01/08/21   Mercy Riding, MD  Blood Glucose Monitoring Suppl (ONETOUCH VERIO) w/Device KIT 1 each by Does not apply route 3 (three) times daily. Use to test blood sugar three times daily. Dx: E11.21 06/15/17   Gildardo Cranker, DO  cephALEXin (KEFLEX) 500 MG capsule Take 1 capsule (500 mg total) by mouth 3 (three) times daily. 11/09/21   Veryl Speak, MD  donepezil (ARICEPT) 10 MG tablet Take 1 tablet (10 mg total) by mouth at bedtime. For memory Patient not taking: Reported on 01/08/2021 03/10/18   Lauree Chandler, NP  Glucerna (GLUCERNA) LIQD Take 237 mLs by mouth 2 (two) times daily between meals.    [provider]  glucose blood (ONETOUCH VERIO) test strip Check blood sugar three times daily DX E11.21 (One Touch Verio IQ) 06/15/17   Gildardo Cranker, DO  insulin aspart (NOVOLOG) 100 UNIT/ML FlexPen Inject 7 Units into the skin 3 (three) times daily with meals. Hold if blood glucose less than 100 01/08/21   Gonfa, Taye T, MD  Insulin Pen Needle (PEN NEEDLES) 31G X 5 MM MISC 1 each by Does not apply route as needed. Use when giving insulin injections. Dx: E11.9 01/08/21   Mercy Riding, MD  levothyroxine (SYNTHROID) 125 MCG tablet Take 125 mcg by mouth daily before breakfast.    [provider]  Melatonin 2.5 MG CHEW Chew 2.5 mg by mouth at bedtime.    [provider]  metFORMIN (GLUCOPHAGE) 1000 MG tablet Take 1 tablet (1,000 mg total) by mouth 2 (two) times daily with a meal. 01/08/21   Gonfa, Charlesetta Ivory, MD  OLANZapine (ZYPREXA) 2.5 MG tablet Take 2.5 mg by mouth at bedtime.    [provider]  sennosides-docusate sodium (SENOKOT-S) 8.6-50 MG tablet Take 2 tablets by mouth daily as needed for  constipation.    [provider]  sucralfate (CARAFATE) 1 g tablet Take 1 tablet (1 g total) by mouth 4 (four) times daily -  with meals and at bedtime for 7 days. 06/01/21 06/08/21  Wynona Dove A, DO  zinc gluconate 50 MG tablet Take 50 mg by mouth daily.    [provider]      Allergies    Lantus [insulin glargine], Levemir [insulin detemir], Pineapple, Codeine, Invokana [canagliflozin], Penicillins, Sulfa antibiotics, Tradjenta [linagliptin], Vioxx [rofecoxib], and Exelon [rivastigmine]    Review of Systems   Review of Systems  Gastrointestinal:  Positive for abdominal pain.    Physical  Exam Updated Vital Signs BP (!) 154/67   Pulse 66   Temp 98 F (36.7 C) (Oral)   Resp 14   SpO2 97%  Physical Exam  ED Results / Procedures / Treatments   Labs (all labs ordered are listed, but only abnormal results are displayed) Labs Reviewed  CBC WITH DIFFERENTIAL/PLATELET - Abnormal; Notable for the following components:      Result Value   Hemoglobin 11.7 (*)    HCT 35.5 (*)    All other components within normal limits  COMPREHENSIVE METABOLIC PANEL - Abnormal; Notable for the following components:   Glucose, Bld 100 (*)    All other components within normal limits  TROPONIN I (HIGH SENSITIVITY)  TROPONIN I (HIGH SENSITIVITY)    EKG None  Radiology DG Hip Unilat W or Wo Pelvis 2-3 Views Left  Result Date: 01/10/2022 CLINICAL DATA:  Left hip pain. EXAM: DG HIP (WITH OR WITHOUT PELVIS) 2-3V LEFT COMPARISON:  Left hip radiographs 12/15/2021 FINDINGS: There is diffuse decreased bone mineralization. Mild bilateral superolateral acetabular degenerative osteophytes. Mild bilateral sacroiliac subchondral sclerosis. The bilateral sacroiliac, bilateral femoroacetabular, and pubic symphysis joint spaces are maintained. No acute fracture or dislocation. Mild vascular calcifications. Vascular phleboliths also overlie the left hemipelvis, similar to prior. IMPRESSION: Mild bilateral femoroacetabular osteoarthritis. No acute fracture. Electronically Signed   By: Yvonne Kendall M.D.   On: 01/10/2022 15:52   DG ABD ACUTE 2+V W 1V CHEST  Result Date: 01/10/2022 CLINICAL DATA:  GERD.  Abdominal pain.  Left hip pain. EXAM: DG ABDOMEN ACUTE WITH 1 VIEW CHEST COMPARISON:  AP chest 11/09/2021, chest two views 06/01/2021; CT chest 05/01/2019; CT abdomen and pelvis 07/18/2017 FINDINGS: Chest: Cardiac silhouette and mediastinal contours are within normal limits. There is an approximate 6 mm nodular density overlying the inferolateral right lung, unchanged from 11/09/2021 and 06/01/2021 radiographs  and appearing very similar to CT chest 05/01/2019 and CT abdomen and pelvis 07/18/2017. No new airspace opacity is seen. No pleural effusion pneumothorax. Moderate multilevel degenerative disc changes of the thoracic spine with mild dextrocurvature of the midthoracic spine. Abdomen: Stool is seen within the ascending greater than descending colon. No dilated loops of bowel to indicate bowel obstruction. No portal venous gas or pneumatosis. No subdiaphragmatic free air is seen on upright view. Vascular phleboliths overlie the left hemipelvis. No acute skeletal abnormality. Mild vascular calcifications. IMPRESSION: 1.  No acute cardiopulmonary disease process. 2. Nonobstructive bowel gas pattern. Electronically Signed   By: Yvonne Kendall M.D.   On: 01/10/2022 15:50    Procedures Procedures  {Document cardiac monitor, telemetry assessment procedure when appropriate:1}  Medications Ordered in ED Medications  HYDROcodone-acetaminophen (NORCO/VICODIN) 5-325 MG per tablet 1 tablet (1 tablet Oral Given 01/10/22 1600)    ED Course/ Medical Decision Making/ A&P  Medical Decision Making Amount and/or Complexity of Data Reviewed Labs: ordered. Radiology: ordered. ECG/medicine tests: ordered.  Risk Prescription drug management.     {Document critical care time when appropriate:1} {Document review of labs and clinical decision tools ie heart score, Chads2Vasc2 etc:1}  {Document your independent review of radiology images, and any outside records:1} {Document your discussion with family members, caretakers, and with consultants:1} {Document social determinants of health affecting pt's care:1} {Document your decision making why or why not admission, treatments were needed:1} Final Clinical Impression(s) / ED Diagnoses Final diagnoses:  Other secondary osteoarthritis of left hip  Gastroesophageal reflux disease with esophagitis without hemorrhage    Rx / DC Orders ED  Discharge Orders          Ordered    pantoprazole (PROTONIX) 20 MG tablet  Daily        01/10/22 1921    HYDROcodone-acetaminophen (NORCO/VICODIN) 5-325 MG tablet        01/10/22 1921

## 2022-01-10 NOTE — ED Triage Notes (Signed)
Patient presents due to complaints of left hip pain, abdominal pain and GERD. She recently was diagnosed with arthritis in the left hip. She believes she may have gallbladder issues. Pain after eating fatty foods and oily stools have been noted. She also complains of trouble sleeping the last few nights.     EMS vitals: 150/90 BP 131 CBG 88 HR  98% SPO2 on room air

## 2022-01-10 NOTE — Discharge Instructions (Signed)
Follow-up with your family doctor in a couple weeks for recheck.

## 2022-05-20 ENCOUNTER — Ambulatory Visit
Admission: EM | Admit: 2022-05-20 | Discharge: 2022-05-20 | Disposition: A | Discharge status: Home / Self Care | Payer: Medicare (Managed Care) | Attending: Internal Medicine | Admitting: Internal Medicine

## 2022-05-20 DIAGNOSIS — Z20822 Contact with and (suspected) exposure to covid-19: Secondary | ICD-10-CM | POA: Diagnosis not present

## 2022-05-20 DIAGNOSIS — R197 Diarrhea, unspecified: Secondary | ICD-10-CM | POA: Insufficient documentation

## 2022-05-20 NOTE — ED Triage Notes (Signed)
Pt present covid exposure a week ago. Pt states that she was inform that it was an outbreak at [facility] and need to get tested. Pt states the only symptoms she had is diarrhea.

## 2022-05-20 NOTE — ED Provider Notes (Addendum)
[name]    CSN: [id number] Arrival date & time: 05/20/22  1205      History   Chief Complaint Chief Complaint  Patient presents with   Covid Exposure    HPI Allison [name] is a 88 y.o. female.   Patient presents with her sister who helps provide history given patient's history of dementia.  Sister reports that she attends a daycare daily and there was a COVID-19 outbreak.  Patient reports that she has had diarrhea and some bodyaches for about 4 days but no other associated symptoms.  Patient is eating and drinking appropriately and has not had any vomiting.  Denies blood in stool. Denies fever.  Blood pressure is elevated but sister reports that she typically gets her blood pressure medication at daycare and she has not had it today.  Patient not reporting any chest pain, shortness of breath, blurry vision, headache.      Past Medical History:  Diagnosis Date   Acute ischemic stroke (HCC) 10/29/2015   Carpal tunnel syndrome    Dementia (HCC)    Diabetes mellitus without complication (HCC)    Dysphagia    GERD (gastroesophageal reflux disease)    Hiatal hernia    HLD (hyperlipidemia)    Hx of adenomatous colonic polyps 06/19/2014   Hyperlipidemia    Hypertension    Hypothyroidism    Onychomycosis 03/05/2012   Pain in lower limb 04/18/2013   Sciatic nerve pain    Vertigo     Patient Active Problem List   Diagnosis Date Noted   Pneumonia 01/07/2021   Family discord 03/28/2019   Neurocognitive deficits 01/04/2018   History of stroke 10/01/2016   Anxiety state 06/03/2016   Uncontrolled type 2 diabetes mellitus with hyperglycemia, with long-term current use of insulin (HCC) 10/29/2015   Visual hallucinations 10/28/2015   Stroke-like symptoms 10/28/2015   CVA (cerebral vascular accident) (HCC) 10/28/2015   Ischemic stroke (HCC)    Diabetes mellitus with complication (HCC)    Hx of adenomatous colonic polyps 06/19/2014   Neuropathic pain 03/27/2014    Uncontrolled type 2 diabetes with neuropathy 03/27/2014   Bilateral carpal tunnel syndrome 03/27/2014   Diabetes mellitus with hyperglycemia (HCC) 08/05/2013   Hypercalcemia 08/05/2013   PVD (peripheral vascular disease) (HCC) 03/05/2012   Onychomycosis 03/05/2012   Other hammer toe (acquired) 03/05/2012   Abnormal CXR 12/25/2011   Cystocele 06/11/2011   Incontinence 06/04/2011   Depression 06/04/2011   Hypothyroidism 06/23/2006   Dyslipidemia 06/23/2006   Essential hypertension 06/23/2006   GERD 06/23/2006   IBS 06/23/2006    Past Surgical History:  Procedure Laterality Date   BLADDER SURGERY     Mesh implant   COLONOSCOPY     LARYNX SURGERY     vocal cords   PARTIAL HYSTERECTOMY  1978   VESICOVAGINAL FISTULA CLOSURE W/ TAH  1976    OB History   No obstetric history on file.      Home Medications    Prior to Admission medications   Medication Sig Start Date End Date Taking? Authorizing Provider  amLODipine (NORVASC) 5 MG tablet TAKE ONE TABLET BY MOUTH DAILY Patient taking differently: Take 5 mg by mouth daily. 03/11/18   Sharon Seller, NP  ascorbic acid (VITAMIN C) 500 MG tablet Take 500 mg by mouth daily. X 10 days 01/03/21   [provider]  ASSURE LANCE LANCETS 21G MISC 1 each by Does not apply route 4 (four) times daily. DX E11.65 01/04/18  Pecola Lawless, MD  atorvastatin (LIPITOR) 40 MG tablet Take 2 tablets (80 mg total) by mouth daily. 01/08/21   Almon Hercules, MD  blood glucose meter kit and supplies Dispense based on patient and insurance preference. Use up to four times daily as directed. (FOR ICD-10 E10.9, E11.9). 01/08/21   Almon Hercules, MD  Blood Glucose Monitoring Suppl (ONETOUCH VERIO) w/Device KIT 1 each by Does not apply route 3 (three) times daily. Use to test blood sugar three times daily. Dx: E11.21 06/15/17   Kirt Boys, DO  cephALEXin (KEFLEX) 500 MG capsule Take 1 capsule (500 mg total) by mouth 3 (three) times daily.  11/09/21   Geoffery Lyons, MD  donepezil (ARICEPT) 10 MG tablet Take 1 tablet (10 mg total) by mouth at bedtime. For memory Patient not taking: Reported on 01/08/2021 03/10/18   Sharon Seller, NP  Glucerna (GLUCERNA) LIQD Take 237 mLs by mouth 2 (two) times daily between meals.    [provider]  glucose blood (ONETOUCH VERIO) test strip Check blood sugar three times daily DX E11.21 (One Touch Verio IQ) 06/15/17   Kirt Boys, DO  HYDROcodone-acetaminophen (NORCO/VICODIN) 5-325 MG tablet Take 1 every 8 hours for pain not helped by Tylenol alone 01/10/22   Bethann Berkshire, MD  insulin aspart (NOVOLOG) 100 UNIT/ML FlexPen Inject 7 Units into the skin 3 (three) times daily with meals. Hold if blood glucose less than 100 01/08/21   Gonfa, Taye T, MD  Insulin Pen Needle (PEN NEEDLES) 31G X 5 MM MISC 1 each by Does not apply route as needed. Use when giving insulin injections. Dx: E11.9 01/08/21   Almon Hercules, MD  levothyroxine (SYNTHROID) 125 MCG tablet Take 125 mcg by mouth daily before breakfast.    [provider]  Melatonin 2.5 MG CHEW Chew 2.5 mg by mouth at bedtime.    [provider]  metFORMIN (GLUCOPHAGE) 1000 MG tablet Take 1 tablet (1,000 mg total) by mouth 2 (two) times daily with a meal. 01/08/21   Gonfa, Boyce Medici, MD  OLANZapine (ZYPREXA) 2.5 MG tablet Take 2.5 mg by mouth at bedtime.    [provider]  pantoprazole (PROTONIX) 20 MG tablet Take 1 tablet (20 mg total) by mouth daily. 01/10/22   Bethann Berkshire, MD  sennosides-docusate sodium (SENOKOT-S) 8.6-50 MG tablet Take 2 tablets by mouth daily as needed for constipation.    [provider]  sucralfate (CARAFATE) 1 g tablet Take 1 tablet (1 g total) by mouth 4 (four) times daily -  with meals and at bedtime for 7 days. 06/01/21 06/08/21  Tanda Rockers A, DO  zinc gluconate 50 MG tablet Take 50 mg by mouth daily.    [provider]    Family History Family History  Problem  Relation Age of Onset   Alzheimer's disease Mother    Glaucoma Mother    Heart disease Father    Alzheimer's disease Father    Asthma Sister    Allergies Sister    Allergies Sister    Sleep apnea Sister    Heart disease Sister    Breast cancer Maternal Aunt    Pancreatic cancer Maternal Aunt    Prostate cancer Maternal Uncle    Prostate cancer Paternal Uncle    Alcoholism Maternal Uncle    Kidney disease Maternal Aunt    Heart disease Maternal Aunt     Social History Social History   Tobacco Use   Smoking status: Never  Smokeless tobacco: Never  Vaping Use   Vaping Use: Never used  Substance Use Topics   Alcohol use: No    Alcohol/week: 0.0 standard drinks of alcohol   Drug use: No     Allergies   Lantus [insulin glargine], Levemir [insulin detemir], Pineapple, Codeine, Invokana [canagliflozin], Penicillins, Sulfa antibiotics, Tradjenta [linagliptin], Vioxx [rofecoxib], and Exelon [rivastigmine]   Review of Systems Review of Systems Per HPI  Physical Exam Triage Vital Signs ED Triage Vitals  Enc Vitals Group     BP 05/20/22 1346 (!) 180/90     Pulse Rate 05/20/22 1346 79     Resp 05/20/22 1346 16     Temp 05/20/22 1346 98 F (36.7 C)     Temp Source 05/20/22 1346 Oral     SpO2 05/20/22 1346 98 %     Weight --      Height --      Head Circumference --      Peak Flow --      Pain Score 05/20/22 1347 0     Pain Loc --      Pain Edu? --      Excl. in GC? --    No data found.  Updated Vital Signs BP (!) 180/90 (BP Location: Left Arm)   Pulse 79   Temp 98 F (36.7 C) (Oral)   Resp 16   SpO2 98%   Visual Acuity Right Eye Distance:   Left Eye Distance:   Bilateral Distance:    Right Eye Near:   Left Eye Near:    Bilateral Near:     Physical Exam Constitutional:      General: She is not in acute distress.    Appearance: Normal appearance. She is not toxic-appearing or diaphoretic.  HENT:     Head: Normocephalic and atraumatic.      Mouth/Throat:     Mouth: Mucous membranes are moist.     Pharynx: No posterior oropharyngeal erythema.  Eyes:     Extraocular Movements: Extraocular movements intact.     Conjunctiva/sclera: Conjunctivae normal.     Pupils: Pupils are equal, round, and reactive to light.  Cardiovascular:     Rate and Rhythm: Normal rate and regular rhythm.     Pulses: Normal pulses.     Heart sounds: Normal heart sounds.  Pulmonary:     Effort: Pulmonary effort is normal. No respiratory distress.     Breath sounds: Normal breath sounds.  Abdominal:     General: Bowel sounds are normal. There is no distension.     Palpations: Abdomen is soft.     Tenderness: There is no abdominal tenderness.  Neurological:     General: No focal deficit present.     Mental Status: She is alert and oriented to person, place, and time. Mental status is at baseline.     Cranial Nerves: Cranial nerves 2-12 are intact.     Sensory: Sensation is intact.     Motor: Motor function is intact.     Coordination: Coordination is intact.     Gait: Gait is intact.     Comments: Patient walks with a cane but otherwise normal neuroexam.  Psychiatric:        Mood and Affect: Mood normal.        Behavior: Behavior normal.        Thought Content: Thought content normal.        Judgment: Judgment normal.      UC Treatments / Results  Labs (all labs ordered are listed, but only abnormal results are displayed) Labs Reviewed  SARS CORONAVIRUS 2 (TAT 6-24 HRS)    EKG   Radiology No results found.  Procedures Procedures (including critical care time)  Medications Ordered in UC Medications - No data to display  Initial Impression / Assessment and Plan / UC Course  I have reviewed the triage vital signs and the nursing notes.  Pertinent labs & imaging results that were available during my care of the patient were reviewed by me and considered in my medical decision making (see chart for details).     Patient presents  with COVID exposure.  COVID test pending.  Awaiting results.  Patient may be a candidate for molnupiravir if COVID test is positive.  Advised adequate fluid hydration and bland diet for diarrhea.  No signs of dehydration or acute abdomen on exam so do not think that emergent evaluation is necessary.  Advised strict return and ER precautions.  Blood pressure is elevated but patient has not had her medication today.  It appears that she is asymptomatic regarding blood pressure so do not think that emergent evaluation is necessary.  Encouraged sister to have her take her medication as soon as possible and sister reports that she is able to get this medication from daycare.  They both verbalized understanding and were agreeable with plan. Final Clinical Impressions(s) / UC Diagnoses   Final diagnoses:  Close exposure to COVID-19 virus  Diarrhea, unspecified type     Discharge Instructions      COVID test is pending.  Will call if it is positive.  Ensure adequate fluid hydration and rest.  Follow-up if any symptoms persist or worsen.  Also recommend that you get blood pressure medication in her as soon as possible given blood pressure is elevated.    ED Prescriptions   None    PDMP not reviewed this encounter.   Gustavus Bryant, [place] 05/20/22 1430    Gustavus Bryant, [place] 05/20/22 1430

## 2022-05-20 NOTE — Discharge Instructions (Signed)
COVID test is pending.  Will call if it is positive.  Ensure adequate fluid hydration and rest.  Follow-up if any symptoms persist or worsen.  Also recommend that you get blood pressure medication in her as soon as possible given blood pressure is elevated.

## 2022-05-21 LAB — SARS CORONAVIRUS 2 (TAT 6-24 HRS): SARS Coronavirus 2: NEGATIVE

## 2022-05-26 ENCOUNTER — Encounter (HOSPITAL_COMMUNITY): Payer: Self-pay

## 2022-05-26 ENCOUNTER — Other Ambulatory Visit: Payer: Self-pay

## 2022-05-26 ENCOUNTER — Emergency Department (HOSPITAL_COMMUNITY)
Admission: EM | Admit: 2022-05-26 | Discharge: 2022-05-26 | Disposition: A | Discharge status: Home / Self Care | Payer: Medicare (Managed Care) | Attending: Emergency Medicine | Admitting: Emergency Medicine

## 2022-05-26 DIAGNOSIS — M79605 Pain in left leg: Secondary | ICD-10-CM | POA: Diagnosis present

## 2022-05-26 DIAGNOSIS — R252 Cramp and spasm: Secondary | ICD-10-CM

## 2022-05-26 DIAGNOSIS — Z794 Long term (current) use of insulin: Secondary | ICD-10-CM | POA: Insufficient documentation

## 2022-05-26 DIAGNOSIS — E119 Type 2 diabetes mellitus without complications: Secondary | ICD-10-CM | POA: Diagnosis not present

## 2022-05-26 DIAGNOSIS — E039 Hypothyroidism, unspecified: Secondary | ICD-10-CM | POA: Insufficient documentation

## 2022-05-26 DIAGNOSIS — F0280 Dementia in other diseases classified elsewhere without behavioral disturbance: Secondary | ICD-10-CM | POA: Insufficient documentation

## 2022-05-26 DIAGNOSIS — Z79899 Other long term (current) drug therapy: Secondary | ICD-10-CM | POA: Diagnosis not present

## 2022-05-26 DIAGNOSIS — Z7984 Long term (current) use of oral hypoglycemic drugs: Secondary | ICD-10-CM | POA: Diagnosis not present

## 2022-05-26 DIAGNOSIS — Z8673 Personal history of transient ischemic attack (TIA), and cerebral infarction without residual deficits: Secondary | ICD-10-CM | POA: Insufficient documentation

## 2022-05-26 DIAGNOSIS — I10 Essential (primary) hypertension: Secondary | ICD-10-CM | POA: Insufficient documentation

## 2022-05-26 NOTE — Discharge Instructions (Addendum)
Make sure you start taking your medications again and [facility] to [facility].

## 2022-05-26 NOTE — ED Notes (Signed)
Patient walked from exam room to the end of the nurse station without assistance. Staff walked be side patient to make sure she would not fall.

## 2022-05-26 NOTE — ED Provider Notes (Signed)
[place] EMERGENCY DEPARTMENT AT [facility] Provider Note   CSN: [id number] Arrival date & time: 05/26/22  2132     History  Chief Complaint  Patient presents with   Leg Pain    SAVINA BARRIOS is a 88 y.o. female.  Patient is an 88 year old female with a history of diabetes, hypertension, GERD, hypothyroidism, stroke and dementia who is presenting today due to sudden onset of severe left leg pain prior to arrival.  Patient reports that she had not taken her medicines today because she was babysitting with her sister and it was a busy day.  She reports this evening she had planned on going to bed.  She had had some watermelon and ice cream and went and changed into per [facility] and got into bed.  She was lying in bed when she shifted to the right and move both of her legs when she suddenly developed a severe pain in her left lower extremity.  She reports it was at her ankle and foot and went up to about her knee.  She reports the pain was crippling and so severe it was making her scream.  She is not sure how long it lasted but reports it is gone now.  She has no pain in her foot or ankle at all.  She has some minimal soreness around her kneecap.  She denies falls and denies history of chronic leg pain.  EMS did report her blood sugar was elevated prior to arrival but otherwise vital signs were reassuring except for hypertension and she has not taken her medication today.  The history is provided by the patient and the EMS personnel.  Leg Pain      Home Medications Prior to Admission medications   Medication Sig Start Date End Date Taking? Authorizing Provider  amLODipine (NORVASC) 5 MG tablet TAKE ONE TABLET BY MOUTH DAILY Patient taking differently: Take 5 mg by mouth daily. 03/11/18   Sharon Seller, NP  ascorbic acid (VITAMIN C) 500 MG tablet Take 500 mg by mouth daily. X 10 days 01/03/21   [provider]  ASSURE LANCE LANCETS 21G MISC 1 each by Does not  apply route 4 (four) times daily. DX E11.65 01/04/18   Pecola Lawless, MD  atorvastatin (LIPITOR) 40 MG tablet Take 2 tablets (80 mg total) by mouth daily. 01/08/21   Almon Hercules, MD  blood glucose meter kit and supplies Dispense based on patient and insurance preference. Use up to four times daily as directed. (FOR ICD-10 E10.9, E11.9). 01/08/21   Almon Hercules, MD  Blood Glucose Monitoring Suppl (ONETOUCH VERIO) w/Device KIT 1 each by Does not apply route 3 (three) times daily. Use to test blood sugar three times daily. Dx: E11.21 06/15/17   Kirt Boys, DO  cephALEXin (KEFLEX) 500 MG capsule Take 1 capsule (500 mg total) by mouth 3 (three) times daily. 11/09/21   Geoffery Lyons, MD  donepezil (ARICEPT) 10 MG tablet Take 1 tablet (10 mg total) by mouth at bedtime. For memory Patient not taking: Reported on 01/08/2021 03/10/18   Sharon Seller, NP  Glucerna (GLUCERNA) LIQD Take 237 mLs by mouth 2 (two) times daily between meals.    [provider]  glucose blood (ONETOUCH VERIO) test strip Check blood sugar three times daily DX E11.21 (One Touch Verio IQ) 06/15/17   Kirt Boys, DO  HYDROcodone-acetaminophen (NORCO/VICODIN) 5-325 MG tablet Take 1 every 8 hours for pain not helped by Tylenol alone  01/10/22   Bethann Berkshire, MD  insulin aspart (NOVOLOG) 100 UNIT/ML FlexPen Inject 7 Units into the skin 3 (three) times daily with meals. Hold if blood glucose less than 100 01/08/21   Gonfa, Taye T, MD  Insulin Pen Needle (PEN NEEDLES) 31G X 5 MM MISC 1 each by Does not apply route as needed. Use when giving insulin injections. Dx: E11.9 01/08/21   Almon Hercules, MD  levothyroxine (SYNTHROID) 125 MCG tablet Take 125 mcg by mouth daily before breakfast.    [provider]  Melatonin 2.5 MG CHEW Chew 2.5 mg by mouth at bedtime.    [provider]  metFORMIN (GLUCOPHAGE) 1000 MG tablet Take 1 tablet (1,000 mg total) by mouth 2 (two) times daily with a meal. 01/08/21   Gonfa,  Boyce Medici, MD  OLANZapine (ZYPREXA) 2.5 MG tablet Take 2.5 mg by mouth at bedtime.    [provider]  pantoprazole (PROTONIX) 20 MG tablet Take 1 tablet (20 mg total) by mouth daily. 01/10/22   Bethann Berkshire, MD  sennosides-docusate sodium (SENOKOT-S) 8.6-50 MG tablet Take 2 tablets by mouth daily as needed for constipation.    [provider]  sucralfate (CARAFATE) 1 g tablet Take 1 tablet (1 g total) by mouth 4 (four) times daily -  with meals and at bedtime for 7 days. 06/01/21 06/08/21  Tanda Rockers A, DO  zinc gluconate 50 MG tablet Take 50 mg by mouth daily.    [provider]      Allergies    Lantus [insulin glargine], Levemir [insulin detemir], Pineapple, Codeine, Invokana [canagliflozin], Penicillins, Sulfa antibiotics, Tradjenta [linagliptin], Vioxx [rofecoxib], and Exelon [rivastigmine]    Review of Systems   Review of Systems  Physical Exam Updated Vital Signs BP (!) 171/81   Pulse 86   Temp 97.9 F (36.6 C) (Oral)   Resp 18   SpO2 98%  Physical Exam Vitals and nursing note reviewed.  Constitutional:      General: She is not in acute distress.    Appearance: She is well-developed.  HENT:     Head: Normocephalic and atraumatic.  Eyes:     Pupils: Pupils are equal, round, and reactive to light.  Cardiovascular:     Rate and Rhythm: Normal rate and regular rhythm.     Heart sounds: Normal heart sounds. No murmur heard.    No friction rub.  Pulmonary:     Effort: Pulmonary effort is normal.     Breath sounds: Normal breath sounds. No wheezing or rales.  Abdominal:     General: Bowel sounds are normal. There is no distension.     Palpations: Abdomen is soft.     Tenderness: There is no abdominal tenderness. There is no guarding or rebound.  Musculoskeletal:        General: No tenderness. Normal range of motion.     Right lower leg: No edema.     Left lower leg: No edema.     Comments: No edema.  2+ DP and PT pulse in the left lower  extremity.  The foot is warm with less than 3-second capillary refill in all toes.  Full range of motion of the ankle, foot and knee without reproducible pain.  No pain on palpation.  Skin:    General: Skin is warm and dry.     Findings: No rash.  Neurological:     Mental Status: She is alert and oriented to person, place, and time. Mental status is at  baseline.     Cranial Nerves: No cranial nerve deficit.  Psychiatric:        Behavior: Behavior normal.     ED Results / Procedures / Treatments   Labs (all labs ordered are listed, but only abnormal results are displayed) Labs Reviewed - No data to display  EKG None  Radiology No results found.  Procedures Procedures    Medications Ordered in ED Medications - No data to display  ED Course/ Medical Decision Making/ A&P                             Medical Decision Making  Patient presenting today with sudden onset of severe pain in her left lower leg which is now gone.  Suspect patient most likely had a cramp as her symptoms are gone and her exam is normal at this time.  She has good pulses, capillary refill and low suspicion for arterial occlusion.  No findings to suggest DVT.  Patient is moving her foot and ankle as well as her knee without difficulty.  She is able to ambulate.  Patient's blood sugar was reported being in the 300s by EMS but patient has not taken her medication today.  [facility] but has not been there in the last 4 days because she has been been busy.  Patient's son arrived and reports she is at her baseline at this time but he was going to make sure she went to [facility] tomorrow so they could get her back on track.  Possible hyperglycemia as a cause for her cramp.  Did offer blood work to evaluate potassium levels but patient declined.  She was able to ambulate here without having any pain and at this time does not appear to need any advanced imaging or testing.  Will discharge home with her  son.        Final Clinical Impression(s) / ED Diagnoses Final diagnoses:  Leg cramps    Rx / DC Orders ED Discharge Orders     None         Gwyneth Sprout, MD 05/26/22 2237

## 2022-05-26 NOTE — ED Triage Notes (Signed)
Patient arrives with complaints of leg pain in the left lower leg. Patient was just laying in bed when the pain occurred. Hx: DM

## 2022-06-22 ENCOUNTER — Encounter (HOSPITAL_COMMUNITY): Payer: Self-pay

## 2022-06-22 ENCOUNTER — Emergency Department (HOSPITAL_COMMUNITY)
Admission: EM | Admit: 2022-06-22 | Discharge: 2022-06-23 | Disposition: A | Discharge status: Home / Self Care | Payer: Medicare (Managed Care) | Attending: Emergency Medicine | Admitting: Emergency Medicine

## 2022-06-22 DIAGNOSIS — I10 Essential (primary) hypertension: Secondary | ICD-10-CM | POA: Diagnosis not present

## 2022-06-22 DIAGNOSIS — M79604 Pain in right leg: Secondary | ICD-10-CM | POA: Insufficient documentation

## 2022-06-22 DIAGNOSIS — Z794 Long term (current) use of insulin: Secondary | ICD-10-CM | POA: Insufficient documentation

## 2022-06-22 DIAGNOSIS — Z79899 Other long term (current) drug therapy: Secondary | ICD-10-CM | POA: Diagnosis not present

## 2022-06-22 DIAGNOSIS — D72829 Elevated white blood cell count, unspecified: Secondary | ICD-10-CM | POA: Diagnosis not present

## 2022-06-22 DIAGNOSIS — E1165 Type 2 diabetes mellitus with hyperglycemia: Secondary | ICD-10-CM | POA: Diagnosis not present

## 2022-06-22 DIAGNOSIS — R944 Abnormal results of kidney function studies: Secondary | ICD-10-CM | POA: Insufficient documentation

## 2022-06-22 NOTE — ED Triage Notes (Signed)
Pt fell yesterday, coming in for some right shin pain, no other complaints of pain, not on blood thinners. No injuries noted on the head, and is Alert and oriented for baseline. Sister is caregiver at home  Medic Vitals  128/70 97% ra 93hr [phone] 18rr

## 2022-06-22 NOTE — ED Provider Triage Note (Signed)
Emergency Medicine Provider Triage Evaluation Note  Allison [name] , a 88 y.o. female  was evaluated in triage.  Pt complains of pain in the midshaft of the tib-fib mainly on the tibia on the lateral aspect, came on suddenly earlier today lasted less than 20 minutes and since resolved, she has no pain at this time, denies any worsening leg swelling, no history PEs or DVTs denies any chest pain short of breath, does note that she fell yesterday states it was mechanical unclear she found outside but does not Dors any new pains.  She is not on any anticoag's..  Review of Systems  Positive: Right leg pain, calf pain Negative: Shortness breath, chest pain  Physical Exam  BP (!) 123/99   Pulse 86   Temp 99 F (37.2 C) (Oral)   Resp 17   SpO2 99%  Gen:   Awake, no distress   Resp:  Normal effort  MSK:   Moves extremities without difficulty  Other:    Medical Decision Making  Medically screening exam initiated at 11:48 PM.  Appropriate orders placed.  FARRON LAFOND was informed that the remainder of the evaluation will be completed by another provider, this initial triage assessment does not replace that evaluation, and the importance of remaining in the ED until their evaluation is complete.  Lab workup imaging Newman Nip will need further workup.   Carroll Sage, PA-C 06/22/22 2349

## 2022-06-23 ENCOUNTER — Emergency Department (HOSPITAL_COMMUNITY): Payer: Medicare (Managed Care)

## 2022-06-23 LAB — CBC WITH DIFFERENTIAL/PLATELET
Abs Immature Granulocytes: 0.05 10*3/uL (ref 0.00–0.07)
Basophils Absolute: 0.1 10*3/uL (ref 0.0–0.1)
Basophils Relative: 1 %
Eosinophils Absolute: 0.4 10*3/uL (ref 0.0–0.5)
Eosinophils Relative: 3 %
HCT: 38 % (ref 36.0–46.0)
Hemoglobin: 12.3 g/dL (ref 12.0–15.0)
Immature Granulocytes: 0 %
Lymphocytes Relative: 25 %
Lymphs Abs: 3.6 10*3/uL (ref 0.7–4.0)
MCH: 26.3 pg (ref 26.0–34.0)
MCHC: 32.4 g/dL (ref 30.0–36.0)
MCV: 81.4 fL (ref 80.0–100.0)
Monocytes Absolute: 1.2 10*3/uL — ABNORMAL HIGH (ref 0.1–1.0)
Monocytes Relative: 9 %
Neutro Abs: 9.1 10*3/uL — ABNORMAL HIGH (ref 1.7–7.7)
Neutrophils Relative %: 62 %
Platelets: 274 10*3/uL (ref 150–400)
RBC: 4.67 MIL/uL (ref 3.87–5.11)
RDW: 14.5 % (ref 11.5–15.5)
WBC: 14.5 10*3/uL — ABNORMAL HIGH (ref 4.0–10.5)
nRBC: 0 % (ref 0.0–0.2)

## 2022-06-23 LAB — BASIC METABOLIC PANEL
Anion gap: 12 (ref 5–15)
BUN: 16 mg/dL (ref 8–23)
CO2: 28 mmol/L (ref 22–32)
Calcium: 10.1 mg/dL (ref 8.9–10.3)
Chloride: 99 mmol/L (ref 98–111)
Creatinine, Ser: 1.11 mg/dL — ABNORMAL HIGH (ref 0.44–1.00)
GFR, Estimated: 49 mL/min — ABNORMAL LOW (ref >60)
Glucose, Bld: 199 mg/dL — ABNORMAL HIGH (ref 70–99)
Potassium: 4.5 mmol/L (ref 3.5–5.1)
Sodium: 139 mmol/L (ref 135–145)

## 2022-06-23 MED ORDER — CYCLOBENZAPRINE HCL 5 MG PO TABS: 10.0000 mg | ORAL_TABLET | Freq: Two times a day (BID) | ORAL | 0 refills | Status: AC | PRN

## 2022-06-23 NOTE — ED Provider Notes (Signed)
[place] EMERGENCY DEPARTMENT AT [facility] Provider Note   CSN: [id number] Arrival date & time: 06/22/22  2330     History  Chief Complaint  Patient presents with   Allison [name] is a 88 y.o. female.  HPI   Patient with medical history including diabetes, GERD, hypertension, sciatica, CVA presenting with complaints of right leg pain.  Patient states that leg pain started earlier yesterday, came on suddenly, states she feels pain on the midshaft of the right tibia, states that lasted less than 30 minutes and then resolved.  She denies any paresthesia or weakness in that leg, denies any worse.  Denies any pedal edema, she does note that she had a fall yesterday states that she fell on the concrete she is unclear whether she hit her right leg, but she states that she had no pain in that leg after the incident.  She denies hitting her head or losing conscious, she is not on anticoag's, not endorsing any neck pain back pain chest pain abdominal pain no pain in the upper extremities, no hip pain or knee pain.  Patient currently having no pain in her leg at this time.     me Medications Prior to Admission medications   Medication Sig Start Date End Date Taking? Authorizing Provider  cyclobenzaprine (FLEXERIL) 5 MG tablet Take 2 tablets (10 mg total) by mouth 2 (two) times daily as needed for up to 5 days for muscle spasms. 06/23/22 06/28/22 Yes Carroll Sage, PA-C  amLODipine (NORVASC) 5 MG tablet TAKE ONE TABLET BY MOUTH DAILY Patient taking differently: Take 5 mg by mouth daily. 03/11/18   Sharon Seller, NP  ascorbic acid (VITAMIN C) 500 MG tablet Take 500 mg by mouth daily. X 10 days 01/03/21   [provider]  ASSURE LANCE LANCETS 21G MISC 1 each by Does not apply route 4 (four) times daily. DX E11.65 01/04/18   Pecola Lawless, MD  atorvastatin (LIPITOR) 40 MG tablet Take 2 tablets (80 mg total) by mouth daily. 01/08/21   Almon Hercules, MD   blood glucose meter kit and supplies Dispense based on patient and insurance preference. Use up to four times daily as directed. (FOR ICD-10 E10.9, E11.9). 01/08/21   Almon Hercules, MD  Blood Glucose Monitoring Suppl (ONETOUCH VERIO) w/Device KIT 1 each by Does not apply route 3 (three) times daily. Use to test blood sugar three times daily. Dx: E11.21 06/15/17   Kirt Boys, DO  cephALEXin (KEFLEX) 500 MG capsule Take 1 capsule (500 mg total) by mouth 3 (three) times daily. 11/09/21   Geoffery Lyons, MD  donepezil (ARICEPT) 10 MG tablet Take 1 tablet (10 mg total) by mouth at bedtime. For memory Patient not taking: Reported on 01/08/2021 03/10/18   Sharon Seller, NP  Glucerna (GLUCERNA) LIQD Take 237 mLs by mouth 2 (two) times daily between meals.    [provider]  glucose blood (ONETOUCH VERIO) test strip Check blood sugar three times daily DX E11.21 (One Touch Verio IQ) 06/15/17   Kirt Boys, DO  HYDROcodone-acetaminophen (NORCO/VICODIN) 5-325 MG tablet Take 1 every 8 hours for pain not helped by Tylenol alone 01/10/22   Bethann Berkshire, MD  insulin aspart (NOVOLOG) 100 UNIT/ML FlexPen Inject 7 Units into the skin 3 (three) times daily with meals. Hold if blood glucose less than 100 01/08/21   Gonfa, Taye T, MD  Insulin Pen Needle (PEN NEEDLES) 31G X 5 MM  MISC 1 each by Does not apply route as needed. Use when giving insulin injections. Dx: E11.9 01/08/21   Almon Hercules, MD  levothyroxine (SYNTHROID) 125 MCG tablet Take 125 mcg by mouth daily before breakfast.    [provider]  Melatonin 2.5 MG CHEW Chew 2.5 mg by mouth at bedtime.    [provider]  metFORMIN (GLUCOPHAGE) 1000 MG tablet Take 1 tablet (1,000 mg total) by mouth 2 (two) times daily with a meal. 01/08/21   Gonfa, Boyce Medici, MD  OLANZapine (ZYPREXA) 2.5 MG tablet Take 2.5 mg by mouth at bedtime.    [provider]  pantoprazole (PROTONIX) 20 MG tablet Take 1 tablet (20 mg total) by mouth  daily. 01/10/22   Bethann Berkshire, MD  sennosides-docusate sodium (SENOKOT-S) 8.6-50 MG tablet Take 2 tablets by mouth daily as needed for constipation.    [provider]  sucralfate (CARAFATE) 1 g tablet Take 1 tablet (1 g total) by mouth 4 (four) times daily -  with meals and at bedtime for 7 days. 06/01/21 06/08/21  Tanda Rockers A, DO  zinc gluconate 50 MG tablet Take 50 mg by mouth daily.    [provider]      Allergies    Lantus [insulin glargine], Levemir [insulin detemir], Pineapple, Codeine, Invokana [canagliflozin], Penicillins, Sulfa antibiotics, Tradjenta [linagliptin], Vioxx [rofecoxib], and Exelon [rivastigmine]    Review of Systems   Review of Systems  Constitutional:  Negative for chills and fever.  Respiratory:  Negative for shortness of breath.   Cardiovascular:  Negative for chest pain.  Gastrointestinal:  Negative for abdominal pain.  Neurological:  Negative for headaches.    Physical Exam Updated Vital Signs BP (!) 165/86 (BP Location: Left Arm)   Pulse 79   Temp 98.6 F (37 C) (Oral)   Resp 16   SpO2 100%  Physical Exam Vitals and nursing note reviewed.  Constitutional:      General: She is not in acute distress.    Appearance: She is not ill-appearing.  HENT:     Head: Normocephalic and atraumatic.     Comments: No deformity the head present no raccoon eyes or Battle sign noted.    Nose: No congestion.     Mouth/Throat:     Mouth: Mucous membranes are moist.     Pharynx: Oropharynx is clear.     Comments: No trismus no torticollis no oral trauma Eyes:     Extraocular Movements: Extraocular movements intact.     Conjunctiva/sclera: Conjunctivae normal.     Pupils: Pupils are equal, round, and reactive to light.  Cardiovascular:     Rate and Rhythm: Normal rate and regular rhythm.     Pulses: Normal pulses.     Heart sounds: No murmur heard.    No friction rub. No gallop.  Pulmonary:     Effort: No respiratory distress.      Breath sounds: No wheezing, rhonchi or rales.     Comments: No deformity the chest present chest was nontender lung sounds are clear bilaterally Abdominal:     Palpations: Abdomen is soft.     Tenderness: There is no abdominal tenderness. There is no right CVA tenderness or left CVA tenderness.     Comments: No deformity abdomen abdomen soft nontender  Musculoskeletal:     Comments: Spine was palpated was nontender to palpation no step-off deformities noted no pelvis instability no leg shortening.  Patient is moving her upper and lower extremities without difficulty.  There is no unilateral leg swelling no calf tenderness no palpable cords, patient has pants on and she is in the hallway, unable to fully undress patient but able to pull up the pant leg to the knee there is no noted abrasions, erythema or edema present.  Patient no tenderness in the upper aspect of the lower extremities.  Skin:    General: Skin is warm and dry.  Neurological:     Mental Status: She is alert.     Comments: No facial asymmetry no difficulty with word finding following two-step commands there is no unilateral weakness present.  Psychiatric:        Mood and Affect: Mood normal.     ED Results / Procedures / Treatments   Labs (all labs ordered are listed, but only abnormal results are displayed) Labs Reviewed  BASIC METABOLIC PANEL - Abnormal; Notable for the following components:      Result Value   Glucose, Bld 199 (*)    Creatinine, Ser 1.11 (*)    GFR, Estimated 49 (*)    All other components within normal limits  CBC WITH DIFFERENTIAL/PLATELET - Abnormal; Notable for the following components:   WBC 14.5 (*)    Neutro Abs 9.1 (*)    Monocytes Absolute 1.2 (*)    All other components within normal limits    EKG None  Radiology DG Tibia/Fibula Right  Result Date: 06/23/2022 CLINICAL DATA:  Fall, right leg pain EXAM: RIGHT TIBIA AND FIBULA - 2 VIEW COMPARISON:  None Available. FINDINGS: There is  no evidence of fracture or other focal bone lesions. Soft tissues are unremarkable. IMPRESSION: Negative. Electronically Signed   By: Helyn Numbers M.D.   On: 06/23/2022 00:11    Procedures Procedures    Medications Ordered in ED Medications - No data to display  ED Course/ Medical Decision Making/ A&P                             Medical Decision Making Amount and/or Complexity of Data Reviewed Labs: ordered. Radiology: ordered.   This patient presents to the ED for concern of leg pain, this involves an extensive number of treatment options, and is a complaint that carries with it a high risk of complications and morbidity.  The differential diagnosis includes DVT, fracture, dislocation, compartment syndrome, vascular injury    Additional history obtained:  Additional history obtained from son at bedside External records from outside source obtained and reviewed including ED notes   Co morbidities that complicate the patient evaluation  Diabetes, CVA, hypertension  Social Determinants of Health:  Geriatric    Lab Tests:  I Ordered, and personally interpreted labs.  The pertinent results include: CBC shows slight leukocytosis of 14.5, BMP shows glucose of 199, creatinine 1.1, GFR 49   Imaging Studies ordered:  I ordered imaging studies including plain of right leg I independently visualized and interpreted imaging which showed negative acute findings I agree with the radiologist interpretation   Cardiac Monitoring:  The patient was maintained on a cardiac monitor.  I personally viewed and interpreted the cardiac monitored which showed an underlying rhythm of: N/A   Medicines ordered and prescription drug management:  I ordered medication including N/A  I have reviewed the patients home medicines and have made adjustments as needed  Critical Interventions:  N/A   Reevaluation:  Presents with right leg pain currently has no pain at this time, benign  physical exam,  will obtain lab work imaging for further assessment  Patient still has no complaints, lab work is unremarkable, agreement discharge at this time.   Consultations Obtained:  N/a    Test Considered:  N/a    Rule out  Low suspicion for fracture or dislocation as x-ray does not feel any significant findings.  Suspicion for DVT is low at this time presentation atypical, pain appears to be intermittent, pain is mainly on the midshaft anterior aspect of the right lower leg which I would not expect to see a DVT.  Doubt limb ischemia's leg is warm to the touch, 2+ dorsal pedal pulses, sensation tact light touch, gross motor function intact.  Low suspicion for compartment syndrome as area was palpated it was soft to the touch, neurovascular fully intact.  It is on the patient has a slight leukocytosis, doubt this is infectious as no evidence of infection on my exam, she is nontoxic-appearing, vital signs reassuring, suspect slight elevation is acute phase reaction from fall.     Dispostion and problem list  After consideration of the diagnostic results and the patients response to treatment, I feel that the patent would benefit from discharge.  Leg pain-since resolved, possible patient suffering from muscular spasms, will discharge home on a short course of muscle relaxers, cannot fully exclude possibility of DVT will have her come back for outpatient DVT study deferred on anticoag's as risk outweigh benefits, especially since my suspicion for DVT is very low at this time.            Final Clinical Impression(s) / ED Diagnoses Final diagnoses:  Pain of right lower extremity    Rx / DC Orders ED Discharge Orders          Ordered    cyclobenzaprine (FLEXERIL) 5 MG tablet  2 times daily PRN        06/23/22 0431    LE Venous       Comments: IMPORTANT PATIENT INSTRUCTIONS:  You have been scheduled for an Outpatient Vascular Study at [facility].    If  tomorrow is a Saturday, Sunday or holiday, please go to the [facility] Emergency Department Registration Desk at 11 am tomorrow morning and tell them you are there for a vascular study.   If tomorrow is a weekday (Monday-Friday), please go to [facility] Entrance [facility] Registration at 11 am and tell them you are there for a vascular study.   06/23/22 0431              Carroll Sage, PA-C 06/23/22 0434    Gloris Manchester, MD 06/24/22 0800

## 2022-06-23 NOTE — Discharge Instructions (Signed)
Examined was reassuring, I suspect you might have some muscular spasms, given you a muscle relaxer take as prescribed, this medication can make you drowsy do not consume alcohol or operate heavy machinery take this medication.  Please stay hydrated, may use over-the-counter pain medication as needed.  I have ordered you an outpatient ultrasound to rule out possible blood clot in your leg, please review the information above in regards to your outpatient imaging.  Follow-up with your PCP as needed  Come back to the emergency department if you develop chest pain, shortness of breath, severe abdominal pain, uncontrolled nausea, vomiting, diarrhea.

## 2022-06-24 ENCOUNTER — Ambulatory Visit (HOSPITAL_COMMUNITY)
Admission: RE | Admit: 2022-06-24 | Discharge: 2022-06-24 | Disposition: A | Discharge status: Home / Self Care | Payer: Medicare (Managed Care) | Source: Ambulatory Visit | Attending: Student | Admitting: Student

## 2022-06-24 DIAGNOSIS — M79604 Pain in right leg: Secondary | ICD-10-CM

## 2022-06-24 DIAGNOSIS — M79661 Pain in right lower leg: Secondary | ICD-10-CM | POA: Insufficient documentation

## 2022-06-24 NOTE — Progress Notes (Signed)
Right lower extremity venous duplex has been completed. Preliminary results can be found in CV Proc through chart review.   06/24/22 2:32 PM Olen Cordial RVT

## 2022-07-19 ENCOUNTER — Emergency Department (HOSPITAL_COMMUNITY)
Admission: EM | Admit: 2022-07-19 | Discharge: 2022-07-19 | Disposition: A | Discharge status: Home / Self Care | Payer: Medicare (Managed Care) | Attending: Emergency Medicine | Admitting: Emergency Medicine

## 2022-07-19 ENCOUNTER — Other Ambulatory Visit: Payer: Self-pay

## 2022-07-19 DIAGNOSIS — Z7984 Long term (current) use of oral hypoglycemic drugs: Secondary | ICD-10-CM | POA: Diagnosis not present

## 2022-07-19 DIAGNOSIS — R739 Hyperglycemia, unspecified: Secondary | ICD-10-CM | POA: Diagnosis present

## 2022-07-19 DIAGNOSIS — E039 Hypothyroidism, unspecified: Secondary | ICD-10-CM | POA: Diagnosis not present

## 2022-07-19 DIAGNOSIS — Z79899 Other long term (current) drug therapy: Secondary | ICD-10-CM | POA: Insufficient documentation

## 2022-07-19 DIAGNOSIS — E1165 Type 2 diabetes mellitus with hyperglycemia: Secondary | ICD-10-CM | POA: Diagnosis not present

## 2022-07-19 DIAGNOSIS — Z794 Long term (current) use of insulin: Secondary | ICD-10-CM | POA: Diagnosis not present

## 2022-07-19 DIAGNOSIS — I10 Essential (primary) hypertension: Secondary | ICD-10-CM | POA: Insufficient documentation

## 2022-07-19 LAB — URINALYSIS, ROUTINE W REFLEX MICROSCOPIC
Bacteria, UA: NONE SEEN
Bilirubin Urine: NEGATIVE
Glucose, UA: >=500 mg/dL — AB
Hgb urine dipstick: NEGATIVE
Ketones, ur: NEGATIVE mg/dL
Leukocytes,Ua: NEGATIVE
Nitrite: NEGATIVE
Protein, ur: 100 mg/dL — AB
Specific Gravity, Urine: 1.022 (ref 1.005–1.030)
pH: 6 (ref 5.0–8.0)

## 2022-07-19 LAB — CBC WITH DIFFERENTIAL/PLATELET
Abs Immature Granulocytes: 0.03 10*3/uL (ref 0.00–0.07)
Basophils Absolute: 0.1 10*3/uL (ref 0.0–0.1)
Basophils Relative: 1 %
Eosinophils Absolute: 0.3 10*3/uL (ref 0.0–0.5)
Eosinophils Relative: 3 %
HCT: 40.2 % (ref 36.0–46.0)
Hemoglobin: 13.2 g/dL (ref 12.0–15.0)
Immature Granulocytes: 0 %
Lymphocytes Relative: 27 %
Lymphs Abs: 2.6 10*3/uL (ref 0.7–4.0)
MCH: 26.8 pg (ref 26.0–34.0)
MCHC: 32.8 g/dL (ref 30.0–36.0)
MCV: 81.7 fL (ref 80.0–100.0)
Monocytes Absolute: 0.9 10*3/uL (ref 0.1–1.0)
Monocytes Relative: 9 %
Neutro Abs: 6 10*3/uL (ref 1.7–7.7)
Neutrophils Relative %: 60 %
Platelets: 306 10*3/uL (ref 150–400)
RBC: 4.92 MIL/uL (ref 3.87–5.11)
RDW: 13.6 % (ref 11.5–15.5)
WBC: 9.9 10*3/uL (ref 4.0–10.5)
nRBC: 0 % (ref 0.0–0.2)

## 2022-07-19 LAB — BASIC METABOLIC PANEL
Anion gap: 11 (ref 5–15)
BUN: 10 mg/dL (ref 8–23)
CO2: 28 mmol/L (ref 22–32)
Calcium: 9.4 mg/dL (ref 8.9–10.3)
Chloride: 97 mmol/L — ABNORMAL LOW (ref 98–111)
Creatinine, Ser: 0.91 mg/dL (ref 0.44–1.00)
GFR, Estimated: >60 mL/min (ref >60)
Glucose, Bld: 471 mg/dL — ABNORMAL HIGH (ref 70–99)
Potassium: 3.4 mmol/L — ABNORMAL LOW (ref 3.5–5.1)
Sodium: 136 mmol/L (ref 135–145)

## 2022-07-19 LAB — CK: Total CK: 39 U/L (ref 38–234)

## 2022-07-19 LAB — CBG MONITORING, ED: Glucose-Capillary: 447 mg/dL — ABNORMAL HIGH (ref 70–99)

## 2022-07-19 MED ORDER — LACTATED RINGERS IV BOLUS
1000.0000 mL | Freq: Once | INTRAVENOUS | Status: AC
  Administered 2022-07-19: 1000 mL via INTRAVENOUS

## 2022-07-19 MED ORDER — AMLODIPINE BESYLATE 5 MG PO TABS
5.0000 mg | ORAL_TABLET | Freq: Once | ORAL | Status: AC
  Administered 2022-07-19: 5 mg via ORAL
  Filled 2022-07-19: qty 1

## 2022-07-19 MED ORDER — POTASSIUM CHLORIDE CRYS ER 20 MEQ PO TBCR
40.0000 meq | EXTENDED_RELEASE_TABLET | Freq: Once | ORAL | Status: AC
  Administered 2022-07-19: 40 meq via ORAL
  Filled 2022-07-19: qty 2

## 2022-07-19 MED ORDER — INSULIN ASPART 100 UNIT/ML IJ SOLN
5.0000 [IU] | Freq: Once | INTRAMUSCULAR | Status: AC
  Administered 2022-07-19: 5 [IU] via INTRAVENOUS
  Filled 2022-07-19: qty 0.05

## 2022-07-19 NOTE — ED Triage Notes (Signed)
EMS reports from home, called out for weakness and generalized muscle soreness. Dx with COVID on Friday. On arrival CBG 527. States she is non-compliant with diabetes meds last 3 days.  BP 130/98 HR 98 RR 18 Sp02 100 RA CBG 527

## 2022-07-19 NOTE — ED Provider Notes (Signed)
[place] EMERGENCY DEPARTMENT AT [facility] Provider Note   CSN: [id number] Arrival date & time: 07/19/22  1657     History  Chief Complaint  Patient presents with   Covid Positive   Weakness   Generalized Body Aches   Hyperglycemia    Allison [name] is a 88 y.o. female.  Patient is an 88 year old female with a past medical history of diabetes, hypothyroid, hypertension presenting to the emergency department with hyperglycemia.  The patient states that about a week ago she was diagnosed with COVID.  She states that the last 2 days she has not been taking her insulin and when she checked her blood sugar today it was high.  Her family recommended that she come to the emergency department to be evaluated.  Patient states that she has had bodyaches related to her COVID infection and otherwise has her chronic left leg and left shoulder pain.  She denies any abdominal pain, nausea or vomiting, dysuria or hematuria.  The history is provided by the patient.  Weakness Hyperglycemia Associated symptoms: weakness        Home Medications Prior to Admission medications   Medication Sig Start Date End Date Taking? Authorizing Provider  amLODipine (NORVASC) 5 MG tablet TAKE ONE TABLET BY MOUTH DAILY Patient taking differently: Take 5 mg by mouth daily. 03/11/18   Sharon Seller, NP  ascorbic acid (VITAMIN C) 500 MG tablet Take 500 mg by mouth daily. X 10 days 01/03/21   [provider]  ASSURE LANCE LANCETS 21G MISC 1 each by Does not apply route 4 (four) times daily. DX E11.65 01/04/18   Pecola Lawless, MD  atorvastatin (LIPITOR) 40 MG tablet Take 2 tablets (80 mg total) by mouth daily. 01/08/21   Almon Hercules, MD  blood glucose meter kit and supplies Dispense based on patient and insurance preference. Use up to four times daily as directed. (FOR ICD-10 E10.9, E11.9). 01/08/21   Almon Hercules, MD  Blood Glucose Monitoring Suppl (ONETOUCH VERIO) w/Device KIT 1  each by Does not apply route 3 (three) times daily. Use to test blood sugar three times daily. Dx: E11.21 06/15/17   Kirt Boys, DO  cephALEXin (KEFLEX) 500 MG capsule Take 1 capsule (500 mg total) by mouth 3 (three) times daily. 11/09/21   Geoffery Lyons, MD  donepezil (ARICEPT) 10 MG tablet Take 1 tablet (10 mg total) by mouth at bedtime. For memory Patient not taking: Reported on 01/08/2021 03/10/18   Sharon Seller, NP  Glucerna (GLUCERNA) LIQD Take 237 mLs by mouth 2 (two) times daily between meals.    [provider]  glucose blood (ONETOUCH VERIO) test strip Check blood sugar three times daily DX E11.21 (One Touch Verio IQ) 06/15/17   Kirt Boys, DO  HYDROcodone-acetaminophen (NORCO/VICODIN) 5-325 MG tablet Take 1 every 8 hours for pain not helped by Tylenol alone 01/10/22   Bethann Berkshire, MD  insulin aspart (NOVOLOG) 100 UNIT/ML FlexPen Inject 7 Units into the skin 3 (three) times daily with meals. Hold if blood glucose less than 100 01/08/21   Gonfa, Taye T, MD  Insulin Pen Needle (PEN NEEDLES) 31G X 5 MM MISC 1 each by Does not apply route as needed. Use when giving insulin injections. Dx: E11.9 01/08/21   Almon Hercules, MD  levothyroxine (SYNTHROID) 125 MCG tablet Take 125 mcg by mouth daily before breakfast.    [provider]  Melatonin 2.5 MG CHEW Chew 2.5 mg by mouth  at bedtime.    [provider]  metFORMIN (GLUCOPHAGE) 1000 MG tablet Take 1 tablet (1,000 mg total) by mouth 2 (two) times daily with a meal. 01/08/21   Gonfa, Boyce Medici, MD  OLANZapine (ZYPREXA) 2.5 MG tablet Take 2.5 mg by mouth at bedtime.    [provider]  pantoprazole (PROTONIX) 20 MG tablet Take 1 tablet (20 mg total) by mouth daily. 01/10/22   Bethann Berkshire, MD  sennosides-docusate sodium (SENOKOT-S) 8.6-50 MG tablet Take 2 tablets by mouth daily as needed for constipation.    [provider]  sucralfate (CARAFATE) 1 g tablet Take 1 tablet (1 g total) by mouth 4  (four) times daily -  with meals and at bedtime for 7 days. 06/01/21 06/08/21  Tanda Rockers A, DO  zinc gluconate 50 MG tablet Take 50 mg by mouth daily.    [provider]      Allergies    Lantus [insulin glargine], Levemir [insulin detemir], Pineapple, Codeine, Invokana [canagliflozin], Penicillins, Sulfa antibiotics, Tradjenta [linagliptin], Vioxx [rofecoxib], and Exelon [rivastigmine]    Review of Systems   Review of Systems  Neurological:  Positive for weakness.    Physical Exam Updated Vital Signs BP (!) 168/76 (BP Location: Right Arm)   Pulse 72   Temp 97.9 F (36.6 C) (Oral)   Resp 16   SpO2 100%  Physical Exam Vitals and nursing note reviewed.  Constitutional:      General: She is not in acute distress.    Appearance: Normal appearance.  HENT:     Head: Normocephalic and atraumatic.     Nose: Nose normal.     Mouth/Throat:     Mouth: Mucous membranes are moist.     Pharynx: Oropharynx is clear.  Eyes:     Extraocular Movements: Extraocular movements intact.     Conjunctiva/sclera: Conjunctivae normal.  Cardiovascular:     Rate and Rhythm: Normal rate and regular rhythm.     Heart sounds: Normal heart sounds.  Pulmonary:     Effort: Pulmonary effort is normal.     Breath sounds: Normal breath sounds.  Abdominal:     General: Abdomen is flat.     Palpations: Abdomen is soft.     Tenderness: There is no abdominal tenderness.  Musculoskeletal:        General: Normal range of motion.     Cervical back: Normal range of motion.  Skin:    General: Skin is warm and dry.  Neurological:     General: No focal deficit present.     Mental Status: She is alert and oriented to person, place, and time.     Sensory: No sensory deficit.     Motor: No weakness.  Psychiatric:        Mood and Affect: Mood normal.        Behavior: Behavior normal.     ED Results / Procedures / Treatments   Labs (all labs ordered are listed, but only abnormal results are  displayed) Labs Reviewed  BASIC METABOLIC PANEL - Abnormal; Notable for the following components:      Result Value   Potassium 3.4 (*)    Chloride 97 (*)    Glucose, Bld 471 (*)    All other components within normal limits  CBG MONITORING, ED - Abnormal; Notable for the following components:   Glucose-Capillary 447 (*)    All other components within normal limits  CBC WITH DIFFERENTIAL/PLATELET  CK  URINALYSIS, ROUTINE W REFLEX MICROSCOPIC  EKG None  Radiology No results found.  Procedures Procedures    Medications Ordered in ED Medications  lactated ringers bolus 1,000 mL (0 mLs Intravenous Stopped 07/19/22 1824)  insulin aspart (novoLOG) injection 5 Units (5 Units Intravenous Given 07/19/22 1827)  potassium chloride SA (KLOR-CON M) CR tablet 40 mEq (40 mEq Oral Given 07/19/22 1827)  amLODipine (NORVASC) tablet 5 mg (5 mg Oral Given 07/19/22 1827)    ED Course/ Medical Decision Making/ A&P Clinical Course as of 07/19/22 1851  Sun Jul 19, 2022  1815 Hyperglycemia without DKA. Will be given insulin and potassium. [VK]    Clinical Course User Index [VK] Rexford Maus, DO                             Medical Decision Making This patient presents to the ED with chief complaint(s) of hypergylcemia with pertinent past medical history of DM, HTN, hypothyroid which further complicates the presenting complaint. The complaint involves an extensive differential diagnosis and also carries with it a high risk of complications and morbidity.    The differential diagnosis includes hyperglycemic crisis, dehydration, electrolyte abnormality, infection  Additional history obtained: Additional history obtained from N/A Records reviewed prior ED records  ED Course and Reassessment: On patient's arrival to the emergency department she was hypertensive and otherwise hemodynamically stable in no acute distress.  Accu-Chek on arrival showed a blood sugar of 447.  Patient will be  started on IV fluids and will have labs performed to evaluate for hyperglycemic crisis and she will be closely reassessed.  Patient reports that she has been noncompliant with her medication is likely the cause of her hyperglycemia.  Independent labs interpretation:  The following labs were independently interpreted: Hyperglycemia without DKA, mild hypokalemia  Independent visualization of imaging: - N/A  Consultation: - Consulted or discussed management/test interpretation w/ external professional: N/A  Consideration for admission or further workup: Patient has no emergent conditions requiring admission or further work-up at this time and is stable for discharge home with primary care follow-up  Social Determinants of health: N/A    Amount and/or Complexity of Data Reviewed Labs: ordered.  Risk Prescription drug management.          Final Clinical Impression(s) / ED Diagnoses Final diagnoses:  Hyperglycemia    Rx / DC Orders ED Discharge Orders     None         Rexford Maus, DO 07/19/22 1851

## 2022-07-19 NOTE — ED Notes (Signed)
Pt ambulated to and from bathroom with Alivea Gladson assist

## 2022-07-19 NOTE — Discharge Instructions (Signed)
You were seen in the emergency department for your high blood sugar.  Your workup showed no complications from her blood sugar being high and we gave you fluids and insulin in the emergency department to bring your blood sugar down.  You should continue to take your diabetes medications daily as prescribed.  You can follow-up with your primary doctor to have your blood sugar rechecked.  You should return to the emergency department if your blood sugar is too high to read on your monitor, you have severe abdominal pain, you have repetitive vomiting, you are confused or if you have any other new or concerning symptoms.

## 2022-07-27 ENCOUNTER — Encounter: Payer: Self-pay | Admitting: *Deleted

## 2022-07-27 ENCOUNTER — Ambulatory Visit
Admission: EM | Admit: 2022-07-27 | Discharge: 2022-07-27 | Disposition: A | Discharge status: Home / Self Care | Payer: Medicare (Managed Care) | Attending: Emergency Medicine | Admitting: Emergency Medicine

## 2022-07-27 DIAGNOSIS — U071 COVID-19: Secondary | ICD-10-CM | POA: Diagnosis not present

## 2022-07-27 NOTE — ED Triage Notes (Signed)
Family reports pt having had Covid 2-3 wks ago, and wishes to have another Covid test to see if it's negative yet, due to family member's concern.

## 2022-07-27 NOTE — Discharge Instructions (Addendum)
COVID test is pending up to 24 hours, you will be notified of positive test results only  As you recently tested positive for COVID within 2 to 3 weeks ago your COVID test for up to 3 months can show a false positive result and it is typically not recommended that you complete testing  If your testing is positive today you do not need to quarantine  If your testing is positive today you will not receive antiviral medicine  If you are still having any symptoms you may use over-the-counter medicine to help minimize symptoms with any of the suggestions below  If you begin to have any difficulty breathing you may follow-up with urgent care for reevaluation    You can take Tylenol  as needed for fever reduction and pain relief.   For cough: honey 1/2 to 1 teaspoon (you can dilute the honey in water or another fluid).  You can also use guaifenesin and dextromethorphan for cough. You can use a humidifier for chest congestion and cough.  If you don't have a humidifier, you can sit in the bathroom with the hot shower running.      For sore throat: try warm salt water gargles, cepacol lozenges, throat spray, warm tea or water with lemon/honey, popsicles or ice, or OTC cold relief medicine for throat discomfort.   For congestion: take a daily anti-histamine like Zyrtec, Claritin, and a oral decongestant, such as pseudoephedrine.  You can also use Flonase 1-2 sprays in each nostril daily.   It is important to stay hydrated: drink plenty of fluids (water, gatorade/powerade/pedialyte, juices, or teas) to keep your throat moisturized and help further relieve irritation/discomfort.

## 2022-07-27 NOTE — ED Provider Notes (Signed)
[name]    CSN: [id number] Arrival date & time: 07/27/22  1843      History   Chief Complaint Chief Complaint  Patient presents with   Covid Test    HPI Allison [name] is a 88 y.o. female.   Requesting retest today, positive tets three weeks ago, no symptoms,   Past Medical History:  Diagnosis Date   Acute ischemic stroke (HCC) 10/29/2015   Carpal tunnel syndrome    Dementia (HCC)    Diabetes mellitus without complication (HCC)    Dysphagia    GERD (gastroesophageal reflux disease)    Hiatal hernia    HLD (hyperlipidemia)    Hx of adenomatous colonic polyps 06/19/2014   Hyperlipidemia    Hypertension    Hypothyroidism    Onychomycosis 03/05/2012   Pain in lower limb 04/18/2013   Sciatic nerve pain    Vertigo     Patient Active Problem List   Diagnosis Date Noted   Pneumonia 01/07/2021   Family discord 03/28/2019   Neurocognitive deficits 01/04/2018   History of stroke 10/01/2016   Anxiety state 06/03/2016   Uncontrolled type 2 diabetes mellitus with hyperglycemia, with long-term current use of insulin (HCC) 10/29/2015   Visual hallucinations 10/28/2015   Stroke-like symptoms 10/28/2015   CVA (cerebral vascular accident) (HCC) 10/28/2015   Ischemic stroke (HCC)    Diabetes mellitus with complication (HCC)    Hx of adenomatous colonic polyps 06/19/2014   Neuropathic pain 03/27/2014   Uncontrolled type 2 diabetes with neuropathy 03/27/2014   Bilateral carpal tunnel syndrome 03/27/2014   Diabetes mellitus with hyperglycemia (HCC) 08/05/2013   Hypercalcemia 08/05/2013   PVD (peripheral vascular disease) (HCC) 03/05/2012   Onychomycosis 03/05/2012   Other hammer toe (acquired) 03/05/2012   Abnormal CXR 12/25/2011   Cystocele 06/11/2011   Incontinence 06/04/2011   Depression 06/04/2011   Hypothyroidism 06/23/2006   Dyslipidemia 06/23/2006   Essential hypertension 06/23/2006   GERD 06/23/2006   IBS 06/23/2006    Past Surgical History:   Procedure Laterality Date   BLADDER SURGERY     Mesh implant   COLONOSCOPY     LARYNX SURGERY     vocal cords   PARTIAL HYSTERECTOMY  1978   VESICOVAGINAL FISTULA CLOSURE W/ TAH  1976    OB History   No obstetric history on file.      Home Medications    Prior to Admission medications   Medication Sig Start Date End Date Taking? Authorizing Provider  amLODipine (NORVASC) 5 MG tablet TAKE ONE TABLET BY MOUTH DAILY Patient taking differently: Take 5 mg by mouth daily. 03/11/18   Sharon Seller, NP  ascorbic acid (VITAMIN C) 500 MG tablet Take 500 mg by mouth daily. X 10 days 01/03/21   [provider]  ASSURE LANCE LANCETS 21G MISC 1 each by Does not apply route 4 (four) times daily. DX E11.65 01/04/18   Pecola Lawless, MD  atorvastatin (LIPITOR) 40 MG tablet Take 2 tablets (80 mg total) by mouth daily. 01/08/21   Almon Hercules, MD  blood glucose meter kit and supplies Dispense based on patient and insurance preference. Use up to four times daily as directed. (FOR ICD-10 E10.9, E11.9). 01/08/21   Almon Hercules, MD  Blood Glucose Monitoring Suppl (ONETOUCH VERIO) w/Device KIT 1 each by Does not apply route 3 (three) times daily. Use to test blood sugar three times daily. Dx: E11.21 06/15/17   Kirt Boys, DO  cephALEXin (KEFLEX) 500 MG  capsule Take 1 capsule (500 mg total) by mouth 3 (three) times daily. 11/09/21   Geoffery Lyons, MD  donepezil (ARICEPT) 10 MG tablet Take 1 tablet (10 mg total) by mouth at bedtime. For memory Patient not taking: Reported on 01/08/2021 03/10/18   Sharon Seller, NP  Glucerna (GLUCERNA) LIQD Take 237 mLs by mouth 2 (two) times daily between meals.    [provider]  glucose blood (ONETOUCH VERIO) test strip Check blood sugar three times daily DX E11.21 (One Touch Verio IQ) 06/15/17   Kirt Boys, DO  HYDROcodone-acetaminophen (NORCO/VICODIN) 5-325 MG tablet Take 1 every 8 hours for pain not helped by Tylenol alone 01/10/22    Bethann Berkshire, MD  insulin aspart (NOVOLOG) 100 UNIT/ML FlexPen Inject 7 Units into the skin 3 (three) times daily with meals. Hold if blood glucose less than 100 01/08/21   Gonfa, Taye T, MD  Insulin Pen Needle (PEN NEEDLES) 31G X 5 MM MISC 1 each by Does not apply route as needed. Use when giving insulin injections. Dx: E11.9 01/08/21   Almon Hercules, MD  levothyroxine (SYNTHROID) 125 MCG tablet Take 125 mcg by mouth daily before breakfast.    [provider]  Melatonin 2.5 MG CHEW Chew 2.5 mg by mouth at bedtime.    [provider]  metFORMIN (GLUCOPHAGE) 1000 MG tablet Take 1 tablet (1,000 mg total) by mouth 2 (two) times daily with a meal. 01/08/21   Gonfa, Boyce Medici, MD  OLANZapine (ZYPREXA) 2.5 MG tablet Take 2.5 mg by mouth at bedtime.    [provider]  pantoprazole (PROTONIX) 20 MG tablet Take 1 tablet (20 mg total) by mouth daily. 01/10/22   Bethann Berkshire, MD  sennosides-docusate sodium (SENOKOT-S) 8.6-50 MG tablet Take 2 tablets by mouth daily as needed for constipation.    [provider]  sucralfate (CARAFATE) 1 g tablet Take 1 tablet (1 g total) by mouth 4 (four) times daily -  with meals and at bedtime for 7 days. 06/01/21 06/08/21  Tanda Rockers A, DO  zinc gluconate 50 MG tablet Take 50 mg by mouth daily.    [provider]    Family History Family History  Problem Relation Age of Onset   Alzheimer's disease Mother    Glaucoma Mother    Heart disease Father    Alzheimer's disease Father    Asthma Sister    Allergies Sister    Allergies Sister    Sleep apnea Sister    Heart disease Sister    Breast cancer Maternal Aunt    Pancreatic cancer Maternal Aunt    Prostate cancer Maternal Uncle    Prostate cancer Paternal Uncle    Alcoholism Maternal Uncle    Kidney disease Maternal Aunt    Heart disease Maternal Aunt     Social History Social History   Tobacco Use   Smoking status: Never   Smokeless tobacco: Never  Vaping Use    Vaping status: Never Used  Substance Use Topics   Alcohol use: No    Alcohol/week: 0.0 standard drinks of alcohol   Drug use: No     Allergies   Lantus [insulin glargine], Levemir [insulin detemir], Pineapple, Codeine, Invokana [canagliflozin], Penicillins, Sulfa antibiotics, Tradjenta [linagliptin], Vioxx [rofecoxib], and Exelon [rivastigmine]   Review of Systems Review of Systems   Physical Exam Triage Vital Signs ED Triage Vitals  Encounter Vitals Group     BP 07/27/22 1943 (!) 160/86     Systolic BP Percentile --  Diastolic BP Percentile --      Pulse Rate 07/27/22 1943 74     Resp 07/27/22 1943 18     Temp 07/27/22 1943 98 F (36.7 C)     Temp Source 07/27/22 1943 Oral     SpO2 07/27/22 1943 96 %     Weight --      Height --      Head Circumference --      Peak Flow --      Pain Score 07/27/22 1945 0     Pain Loc --      Pain Education --      Exclude from Growth Chart --    No data found.  Updated Vital Signs BP (!) 160/86   Pulse 74   Temp 98 F (36.7 C) (Oral)   Resp 18   SpO2 96%   Visual Acuity Right Eye Distance:   Left Eye Distance:   Bilateral Distance:    Right Eye Near:   Left Eye Near:    Bilateral Near:     Physical Exam   UC Treatments / Results  Labs (all labs ordered are listed, but only abnormal results are displayed) Labs Reviewed  SARS CORONAVIRUS 2 (TAT 6-24 HRS)    EKG   Radiology No results found.  Procedures Procedures (including critical care time)  Medications Ordered in UC Medications - No data to display  Initial Impression / Assessment and Plan / UC Course  I have reviewed the triage vital signs and the nursing notes.  Pertinent labs & imaging results that were available during my care of the patient were reviewed by me and considered in my medical decision making (see chart for details).     *** Final Clinical Impressions(s) / UC Diagnoses   Final diagnoses:  COVID-19     Discharge  Instructions      COVID test is pending up to 24 hours, you will be notified of positive test results only  As you recently tested positive for COVID within 2 to 3 weeks ago your COVID test for up to 3 months can show a false positive result and it is typically not recommended that you complete testing  If your testing is positive today you do not need to quarantine  If your testing is positive today you will not receive antiviral medicine  If you are still having any symptoms you may use over-the-counter medicine to help minimize symptoms with any of the suggestions below  If you begin to have any difficulty breathing you may follow-up with urgent care for reevaluation    You can take Tylenol  as needed for fever reduction and pain relief.   For cough: honey 1/2 to 1 teaspoon (you can dilute the honey in water or another fluid).  You can also use guaifenesin and dextromethorphan for cough. You can use a humidifier for chest congestion and cough.  If you don't have a humidifier, you can sit in the bathroom with the hot shower running.      For sore throat: try warm salt water gargles, cepacol lozenges, throat spray, warm tea or water with lemon/honey, popsicles or ice, or OTC cold relief medicine for throat discomfort.   For congestion: take a daily anti-histamine like Zyrtec, Claritin, and a oral decongestant, such as pseudoephedrine.  You can also use Flonase 1-2 sprays in each nostril daily.   It is important to stay hydrated: drink plenty of fluids (water, gatorade/powerade/pedialyte, juices, or teas) to keep  your throat moisturized and help further relieve irritation/discomfort.    ED Prescriptions   None    PDMP not reviewed this encounter.

## 2022-07-28 LAB — SARS CORONAVIRUS 2 (TAT 6-24 HRS): SARS Coronavirus 2: NEGATIVE

## 2022-07-29 ENCOUNTER — Telehealth: Payer: Self-pay

## 2022-07-29 NOTE — Telephone Encounter (Signed)
Son given lab results, verbalizes understanding.

## 2022-08-15 ENCOUNTER — Inpatient Hospital Stay (HOSPITAL_COMMUNITY)
Admission: EM | Admit: 2022-08-15 | Discharge: 2022-08-18 | DRG: 637 | Disposition: A | Discharge status: Home Health | Payer: Medicare (Managed Care) | Attending: Student | Admitting: Student

## 2022-08-15 ENCOUNTER — Other Ambulatory Visit: Payer: Self-pay

## 2022-08-15 ENCOUNTER — Emergency Department (HOSPITAL_COMMUNITY): Payer: Medicare (Managed Care)

## 2022-08-15 ENCOUNTER — Encounter (HOSPITAL_COMMUNITY): Payer: Self-pay

## 2022-08-15 DIAGNOSIS — Z803 Family history of malignant neoplasm of breast: Secondary | ICD-10-CM

## 2022-08-15 DIAGNOSIS — Z638 Other specified problems related to primary support group: Secondary | ICD-10-CM

## 2022-08-15 DIAGNOSIS — G9341 Metabolic encephalopathy: Secondary | ICD-10-CM | POA: Diagnosis present

## 2022-08-15 DIAGNOSIS — Z7984 Long term (current) use of oral hypoglycemic drugs: Secondary | ICD-10-CM

## 2022-08-15 DIAGNOSIS — I10 Essential (primary) hypertension: Secondary | ICD-10-CM | POA: Diagnosis present

## 2022-08-15 DIAGNOSIS — E875 Hyperkalemia: Secondary | ICD-10-CM | POA: Diagnosis present

## 2022-08-15 DIAGNOSIS — F0280 Dementia in other diseases classified elsewhere without behavioral disturbance: Secondary | ICD-10-CM

## 2022-08-15 DIAGNOSIS — K219 Gastro-esophageal reflux disease without esophagitis: Secondary | ICD-10-CM | POA: Diagnosis present

## 2022-08-15 DIAGNOSIS — Z888 Allergy status to other drugs, medicaments and biological substances status: Secondary | ICD-10-CM

## 2022-08-15 DIAGNOSIS — E1165 Type 2 diabetes mellitus with hyperglycemia: Secondary | ICD-10-CM | POA: Diagnosis present

## 2022-08-15 DIAGNOSIS — E870 Hyperosmolality and hypernatremia: Secondary | ICD-10-CM | POA: Diagnosis present

## 2022-08-15 DIAGNOSIS — E039 Hypothyroidism, unspecified: Secondary | ICD-10-CM | POA: Diagnosis present

## 2022-08-15 DIAGNOSIS — Z800 Family history of malignant neoplasm of digestive organs: Secondary | ICD-10-CM

## 2022-08-15 DIAGNOSIS — G308 Other Alzheimer's disease: Secondary | ICD-10-CM | POA: Diagnosis not present

## 2022-08-15 DIAGNOSIS — Z91018 Allergy to other foods: Secondary | ICD-10-CM

## 2022-08-15 DIAGNOSIS — N179 Acute kidney failure, unspecified: Secondary | ICD-10-CM | POA: Diagnosis present

## 2022-08-15 DIAGNOSIS — E876 Hypokalemia: Secondary | ICD-10-CM | POA: Diagnosis not present

## 2022-08-15 DIAGNOSIS — Z794 Long term (current) use of insulin: Secondary | ICD-10-CM

## 2022-08-15 DIAGNOSIS — Z8249 Family history of ischemic heart disease and other diseases of the circulatory system: Secondary | ICD-10-CM

## 2022-08-15 DIAGNOSIS — Z825 Family history of asthma and other chronic lower respiratory diseases: Secondary | ICD-10-CM

## 2022-08-15 DIAGNOSIS — F02C0 Dementia in other diseases classified elsewhere, severe, without behavioral disturbance, psychotic disturbance, mood disturbance, and anxiety: Secondary | ICD-10-CM | POA: Diagnosis present

## 2022-08-15 DIAGNOSIS — E785 Hyperlipidemia, unspecified: Secondary | ICD-10-CM | POA: Diagnosis present

## 2022-08-15 DIAGNOSIS — Z820 Family history of epilepsy and other diseases of the nervous system: Secondary | ICD-10-CM

## 2022-08-15 DIAGNOSIS — Z8673 Personal history of transient ischemic attack (TIA), and cerebral infarction without residual deficits: Secondary | ICD-10-CM

## 2022-08-15 DIAGNOSIS — Z885 Allergy status to narcotic agent status: Secondary | ICD-10-CM

## 2022-08-15 DIAGNOSIS — E1110 Type 2 diabetes mellitus with ketoacidosis without coma: Principal | ICD-10-CM | POA: Diagnosis present

## 2022-08-15 DIAGNOSIS — Z79899 Other long term (current) drug therapy: Secondary | ICD-10-CM

## 2022-08-15 DIAGNOSIS — G309 Alzheimer's disease, unspecified: Secondary | ICD-10-CM | POA: Diagnosis present

## 2022-08-15 DIAGNOSIS — Z882 Allergy status to sulfonamides status: Secondary | ICD-10-CM

## 2022-08-15 DIAGNOSIS — Z880 Allergy status to penicillin: Secondary | ICD-10-CM

## 2022-08-15 DIAGNOSIS — Z83511 Family history of glaucoma: Secondary | ICD-10-CM

## 2022-08-15 DIAGNOSIS — Z79890 Hormone replacement therapy (postmenopausal): Secondary | ICD-10-CM

## 2022-08-15 DIAGNOSIS — E860 Dehydration: Secondary | ICD-10-CM | POA: Diagnosis present

## 2022-08-15 DIAGNOSIS — Z90711 Acquired absence of uterus with remaining cervical stump: Secondary | ICD-10-CM

## 2022-08-15 DIAGNOSIS — Z91148 Patient's other noncompliance with medication regimen for other reason: Secondary | ICD-10-CM

## 2022-08-15 LAB — URINALYSIS, ROUTINE W REFLEX MICROSCOPIC
Bacteria, UA: NONE SEEN
Bilirubin Urine: NEGATIVE
Glucose, UA: >=500 mg/dL — AB
Ketones, ur: 80 mg/dL — AB
Leukocytes,Ua: NEGATIVE
Nitrite: NEGATIVE
Protein, ur: 100 mg/dL — AB
Specific Gravity, Urine: 1.022 (ref 1.005–1.030)
pH: 5 (ref 5.0–8.0)

## 2022-08-15 LAB — BASIC METABOLIC PANEL
Anion gap: 23 — ABNORMAL HIGH (ref 5–15)
BUN: 38 mg/dL — ABNORMAL HIGH (ref 8–23)
CO2: 16 mmol/L — ABNORMAL LOW (ref 22–32)
Calcium: 10.4 mg/dL — ABNORMAL HIGH (ref 8.9–10.3)
Chloride: 111 mmol/L (ref 98–111)
Creatinine, Ser: 1.76 mg/dL — ABNORMAL HIGH (ref 0.44–1.00)
GFR, Estimated: 28 mL/min — ABNORMAL LOW (ref >60)
Glucose, Bld: 589 mg/dL (ref 70–99)
Potassium: 4 mmol/L (ref 3.5–5.1)
Sodium: 150 mmol/L — ABNORMAL HIGH (ref 135–145)

## 2022-08-15 LAB — CBG MONITORING, ED
Glucose-Capillary: 356 mg/dL — ABNORMAL HIGH (ref 70–99)
Glucose-Capillary: 408 mg/dL — ABNORMAL HIGH (ref 70–99)
Glucose-Capillary: 444 mg/dL — ABNORMAL HIGH (ref 70–99)
Glucose-Capillary: 530 mg/dL (ref 70–99)

## 2022-08-15 LAB — CBC
HCT: 43 % (ref 36.0–46.0)
Hemoglobin: 13.6 g/dL (ref 12.0–15.0)
MCH: 26.3 pg (ref 26.0–34.0)
MCHC: 31.6 g/dL (ref 30.0–36.0)
MCV: 83.2 fL (ref 80.0–100.0)
Platelets: 300 10*3/uL (ref 150–400)
RBC: 5.17 MIL/uL — ABNORMAL HIGH (ref 3.87–5.11)
RDW: 14.9 % (ref 11.5–15.5)
WBC: 18 10*3/uL — ABNORMAL HIGH (ref 4.0–10.5)
nRBC: 0 % (ref 0.0–0.2)

## 2022-08-15 LAB — I-STAT VENOUS BLOOD GAS, ED
Acid-base deficit: 6 mmol/L — ABNORMAL HIGH (ref 0.0–2.0)
Bicarbonate: 18.4 mmol/L — ABNORMAL LOW (ref 20.0–28.0)
Calcium, Ion: 1.23 mmol/L (ref 1.15–1.40)
HCT: 42 % (ref 36.0–46.0)
Hemoglobin: 14.3 g/dL (ref 12.0–15.0)
O2 Saturation: 64 %
Potassium: 4.9 mmol/L (ref 3.5–5.1)
Sodium: 153 mmol/L — ABNORMAL HIGH (ref 135–145)
TCO2: 19 mmol/L — ABNORMAL LOW (ref 22–32)
pCO2, Ven: 33.2 mmHg — ABNORMAL LOW (ref 44–60)
pH, Ven: 7.351 (ref 7.25–7.43)
pO2, Ven: 34 mmHg (ref 32–45)

## 2022-08-15 LAB — BETA-HYDROXYBUTYRIC ACID: Beta-Hydroxybutyric Acid: >8 mmol/L — ABNORMAL HIGH (ref 0.05–0.27)

## 2022-08-15 MED ORDER — LACTATED RINGERS IV SOLN: INTRAVENOUS | Status: DC

## 2022-08-15 MED ORDER — LACTATED RINGERS IV BOLUS
20.0000 mL/kg | Freq: Once | INTRAVENOUS | Status: AC
  Administered 2022-08-15: 1180 mL via INTRAVENOUS

## 2022-08-15 MED ORDER — SODIUM CHLORIDE 0.9 % IV BOLUS
1000.0000 mL | Freq: Once | INTRAVENOUS | Status: AC
  Administered 2022-08-15: 1000 mL via INTRAVENOUS

## 2022-08-15 MED ORDER — INSULIN REGULAR(HUMAN) IN NACL 100-0.9 UT/100ML-% IV SOLN
INTRAVENOUS | Status: DC
  Administered 2022-08-15: 7.5 [IU]/h via INTRAVENOUS
  Filled 2022-08-15: qty 100

## 2022-08-15 MED ORDER — DEXTROSE IN LACTATED RINGERS 5 % IV SOLN: INTRAVENOUS | Status: DC

## 2022-08-15 MED ORDER — DEXTROSE 50 % IV SOLN: 0.0000 mL | INTRAVENOUS | Status: DC | PRN

## 2022-08-15 MED ORDER — POTASSIUM CHLORIDE 10 MEQ/100ML IV SOLN
10.0000 meq | INTRAVENOUS | Status: AC
  Administered 2022-08-15 (×2): 10 meq via INTRAVENOUS
  Filled 2022-08-15 (×2): qty 100

## 2022-08-15 NOTE — Assessment & Plan Note (Signed)
Chronic. 

## 2022-08-15 NOTE — Assessment & Plan Note (Signed)
Continue lipitor  ?

## 2022-08-15 NOTE — Assessment & Plan Note (Signed)
Stable

## 2022-08-15 NOTE — Subjective & Objective (Addendum)
CC: altered mental status HPI: 88 year old African-American female history of dementia, type 2 diabetes, acquired hypothyroidism, dyslipidemia, history of stroke, hypertension, reflux presents to the ER from [facility].  Patient is currently there for what appears to be a respite stay through [facility].  She normally lives with her sister Alexia Freestone.  Alexia Freestone states that she "needed a break".  Date of admission on her admission paperwork was August 14, 2022.  Patient was sent to the ER from the SNF due to "altered mental status".  Patient does have a history of dementia.  On arrival temp 90.4 heart rate 111 blood pressure 183 over 9879% on room air.  Chest x-ray was negative.  Sodium 150, potassium 4.0, bicarb 16, BUN of 38, creatinine 1.76, glucose of 589.  Anion gap of 23  White count 18.0, hemoglobin 13.6, platelets of 300  Patient started on insulin drip.  [facility] hospitalist contacted for admission.  Patient unable to provide any history or review of systems due to dementia.

## 2022-08-15 NOTE — Assessment & Plan Note (Signed)
Use LR for Ivf. Change to 1/2 NS after off insulin gtts. If hypernatremia continues to be a problem, may need to change to D5W.

## 2022-08-15 NOTE — ED Triage Notes (Signed)
Patient arrives from [facility] via EMS. Family saw the patient earlier and wanted her to be evaluated by the ED due to her not acting right. Family states that she is altered but the facility states that this is her baseline. Patient was given fluid in route ( NS).

## 2022-08-15 NOTE — Assessment & Plan Note (Signed)
Pt apparently lives with her sister patty but son Tinnie Gens is pt's Management consultant. Sister patty states pt placed in [facility] SNF this week by [facility] to "give me a break". Sounds like a respite stay. Sister patty states she is going to take the patient home instead of sending pt back to SNF. Sister states pt was suppose to only stay at [facility] only 5 days. Pt was admitted on 08-14-2022.

## 2022-08-15 NOTE — H&P (Signed)
History and Physical    Allison [name]:[id number] DOB: Apr 09, 1934 DOA: 08/15/2022  DOS: the patient was seen and examined on 08/15/2022  [facility]P: [facility]   Patient coming from: SNF. [facility]  I have personally briefly reviewed patient's old medical records in [facility]  CC: altered mental status HPI: 88 year old African-American female history of dementia, type 2 diabetes, acquired hypothyroidism, dyslipidemia, history of stroke, hypertension, reflux presents to the ER from [facility].  Patient is currently there for what appears to be a respite stay through [facility].  She normally lives with her sister Allison [name].  Allison [name] states that she "needed a break".  Date of admission on her admission paperwork was August 14, 2022.  Patient was sent to the ER from the SNF due to "altered mental status".  Patient does have a history of dementia.  On arrival temp 90.4 heart rate 111 blood pressure 183 over 9879% on room air.  Chest x-ray was negative.  Sodium 150, potassium 4.0, bicarb 16, BUN of 38, creatinine 1.76, glucose of 589.  Anion gap of 23  White count 18.0, hemoglobin 13.6, platelets of 300  Patient started on insulin drip.  [facility] hospital[facility] contacted for admission.  Patient unable to provide any history or review of systems due to dementia.   ED Course: serum glucose >500. AG 23.  Review of Systems:  Review of Systems  Unable to perform ROS: Dementia    Past Medical History:  Diagnosis Date   Acute ischemic stroke (HCC) 10/29/2015   Carpal tunnel syndrome    Dementia (HCC)    Diabetes mellitus without complication (HCC)    Dysphagia    GERD (gastroesophageal reflux disease)    Hiatal hernia    HLD (hyperlipidemia)    Hx of adenomatous colonic polyps 06/19/2014   Hyperlipidemia    Hypertension    Hypothyroidism    Onychomycosis 03/05/2012   Pain in lower limb 04/18/2013   Sciatic nerve pain     Stroke-like symptoms 10/28/2015   Vertigo     Past Surgical History:  Procedure Laterality Date   BLADDER SURGERY     Mesh implant   COLONOSCOPY     LARYNX SURGERY     vocal cords   PARTIAL HYSTERECTOMY  1978   VESICOVAGINAL FISTULA CLOSURE W/ TAH  1976     reports that she has never smoked. She has never used smokeless tobacco. She reports that she does not drink alcohol and does not use drugs.  Allergies  Allergen Reactions   Lantus [Insulin Glargine] Itching and Swelling    angioedema   Levemir [Insulin Detemir] Itching and Swelling    angioedema   Pineapple Anaphylaxis   Codeine Other (See Comments)    unknown   Invokana [Canagliflozin] Other (See Comments)    Constipation, nightmares   Penicillins Other (See Comments)    Unknown    Sulfa Antibiotics Other (See Comments)    UNk   Tradjenta [Linagliptin] Other (See Comments)    Unknown   Vioxx [Rofecoxib] Other (See Comments)    unknown   Exelon [Rivastigmine] Rash    Family History  Problem Relation Age of Onset   Alzheimer's disease Mother    Glaucoma Mother    Heart disease Father    Alzheimer's disease Father    Asthma Sister    Allergies Sister    Allergies Sister    Sleep apnea Sister    Heart disease Sister  Breast cancer Maternal Aunt    Pancreatic cancer Maternal Aunt    Prostate cancer Maternal Uncle    Prostate cancer Paternal Uncle    Alcoholism Maternal Uncle    Kidney disease Maternal Aunt    Heart disease Maternal Aunt     Prior to Admission medications   Medication Sig Start Date End Date Taking? Authorizing Provider  amLODipine (NORVASC) 5 MG tablet TAKE ONE TABLET BY MOUTH DAILY Patient taking differently: Take 5 mg by mouth daily. 03/11/18   Sharon Seller, NP  ascorbic acid (VITAMIN C) 500 MG tablet Take 500 mg by mouth daily. X 10 days 01/03/21   [provider]  ASSURE LANCE LANCETS 21G MISC 1 each by Does not apply route 4 (four) times daily. DX E11.65 01/04/18    Pecola Lawless, MD  atorvastatin (LIPITOR) 40 MG tablet Take 2 tablets (80 mg total) by mouth daily. 01/08/21   Almon Hercules, MD  blood glucose meter kit and supplies Dispense based on patient and insurance preference. Use up to four times daily as directed. (FOR ICD-10 E10.9, E11.9). 01/08/21   Almon Hercules, MD  Blood Glucose Monitoring Suppl (ONETOUCH VERIO) w/Device KIT 1 each by Does not apply route 3 (three) times daily. Use to test blood sugar three times daily. Dx: E11.21 06/15/17   Kirt Boys, DO  cephALEXin (KEFLEX) 500 MG capsule Take 1 capsule (500 mg total) by mouth 3 (three) times daily. 11/09/21   Geoffery Lyons, MD  donepezil (ARICEPT) 10 MG tablet Take 1 tablet (10 mg total) by mouth at bedtime. For memory Patient not taking: Reported on 01/08/2021 03/10/18   Sharon Seller, NP  Glucerna (GLUCERNA) LIQD Take 237 mLs by mouth 2 (two) times daily between meals.    [provider]  glucose blood (ONETOUCH VERIO) test strip Check blood sugar three times daily DX E11.21 (One Touch Verio IQ) 06/15/17   Kirt Boys, DO  HYDROcodone-acetaminophen (NORCO/VICODIN) 5-325 MG tablet Take 1 every 8 hours for pain not helped by Tylenol alone 01/10/22   Bethann Berkshire, MD  insulin aspart (NOVOLOG) 100 UNIT/ML FlexPen Inject 7 Units into the skin 3 (three) times daily with meals. Hold if blood glucose less than 100 01/08/21   Gonfa, Taye T, MD  Insulin Pen Needle (PEN NEEDLES) 31G X 5 MM MISC 1 each by Does not apply route as needed. Use when giving insulin injections. Dx: E11.9 01/08/21   Almon Hercules, MD  levothyroxine (SYNTHROID) 125 MCG tablet Take 125 mcg by mouth daily before breakfast.    [provider]  Melatonin 2.5 MG CHEW Chew 2.5 mg by mouth at bedtime.    [provider]  metFORMIN (GLUCOPHAGE) 1000 MG tablet Take 1 tablet (1,000 mg total) by mouth 2 (two) times daily with a meal. 01/08/21   Gonfa, Boyce Medici, MD  OLANZapine (ZYPREXA) 2.5 MG tablet Take  2.5 mg by mouth at bedtime.    [provider]  pantoprazole (PROTONIX) 20 MG tablet Take 1 tablet (20 mg total) by mouth daily. 01/10/22   Bethann Berkshire, MD  sennosides-docusate sodium (SENOKOT-S) 8.6-50 MG tablet Take 2 tablets by mouth daily as needed for constipation.    [provider]  sucralfate (CARAFATE) 1 g tablet Take 1 tablet (1 g total) by mouth 4 (four) times daily -  with meals and at bedtime for 7 days. 06/01/21 06/08/21  Tanda Rockers A, DO  zinc gluconate 50 MG tablet Take 50 mg by  mouth daily.    [provider]    Physical Exam: Vitals:   08/15/22 2130 08/15/22 2200 08/15/22 2230 08/15/22 2236  BP: (!) 153/121 (!) 170/89 (!) 165/115   Pulse: (!) 108 (!) 107 (!) 104   Resp: 15 18 20    Temp:      TempSrc:      SpO2: 96% 98% 99%   Weight:    59 kg  Height:    5\' 4"  (1.626 m)    Physical Exam Vitals and nursing note reviewed.  Constitutional:      General: She is not in acute distress.    Appearance: She is not toxic-appearing.     Comments: Appears chronically ill  HENT:     Head: Normocephalic and atraumatic.     Nose: Nose normal.  Eyes:     General: No scleral icterus. Cardiovascular:     Rate and Rhythm: Regular rhythm. Tachycardia present.  Pulmonary:     Effort: Pulmonary effort is normal. No respiratory distress.  Abdominal:     General: Bowel sounds are normal. There is no distension.  Musculoskeletal:     Right lower leg: No edema.     Left lower leg: No edema.  Skin:    General: Skin is warm and dry.     Capillary Refill: Capillary refill takes less than 2 seconds.  Neurological:     Mental Status: She is disoriented.      Labs on Admission: I have personally reviewed following labs and imaging studies  CBC: Recent Labs  Lab 08/15/22 2036  WBC 18.0*  HGB 13.6  HCT 43.0  MCV 83.2  PLT 300   Basic Metabolic Panel: Recent Labs  Lab 08/15/22 2036  NA 150*  K 4.0  CL 111  CO2 16*  GLUCOSE 589*  BUN 38*   CREATININE 1.76*  CALCIUM 10.4*   GFR: Estimated Creatinine Clearance: 20.2 mL/min (A) (by C-G formula based on SCr of 1.76 mg/dL (H)).  CBG: Recent Labs  Lab 08/15/22 2028 08/15/22 2240  GLUCAP 530* 444*   Urine analysis:    Component Value Date/Time   COLORURINE STRAW (A) 08/15/2022 2105   APPEARANCEUR CLEAR 08/15/2022 2105   LABSPEC 1.022 08/15/2022 2105   PHURINE 5.0 08/15/2022 2105   GLUCOSEU >=500 (A) 08/15/2022 2105   HGBUR MODERATE (A) 08/15/2022 2105   BILIRUBINUR NEGATIVE 08/15/2022 2105   KETONESUR 80 (A) 08/15/2022 2105   PROTEINUR 100 (A) 08/15/2022 2105   UROBILINOGEN 0.2 03/29/2014 1055   NITRITE NEGATIVE 08/15/2022 2105   LEUKOCYTESUR NEGATIVE 08/15/2022 2105    Radiological Exams on Admission: I have personally reviewed images DG Chest Portable 1 View  Result Date: 08/15/2022 CLINICAL DATA:  Altered mental status EXAM: PORTABLE CHEST 1 VIEW COMPARISON:  01/10/2022 FINDINGS: Lung volumes are small and there is bibasilar atelectasis. No pneumothorax or pleural effusion. Cardiac size within normal limits. Pulmonary vascularity is normal. No acute bone abnormality. IMPRESSION: 1. Pulmonary hypoinflation. Electronically Signed   By: Helyn Numbers M.D.   On: 08/15/2022 21:17    EKG: My personal interpretation of EKG shows: sinus tachycardia    Assessment/Plan Principal Problem:   DKA, type 2 (HCC) Active Problems:   Family discord   Hypothyroidism   Dyslipidemia   Essential hypertension   GERD   Diabetes mellitus with hyperglycemia (HCC)   Alzheimer's dementia (HCC)   History of stroke   Hypernatremia    Assessment and Plan: * DKA, type 2 (HCC) Observation progressive bed. Start  insulin protocol for DKA.  Family discord Pt apparently lives with her sister Allison [name] but son jeffrey is pt's Management consultant. Sister Allison [name] states pt placed in [facility] this week by [facility] to "give me a break". Sounds like a respite stay. Sister Allison [name] states she is going  to take the patient home instead of sending pt back to SNF. Sister states pt was suppose to only stay at [facility] only 5 days. Pt was admitted on 08-14-2022.  Alzheimer's dementia (HCC) Chronic.  Diabetes mellitus with hyperglycemia (HCC) Pt on amaryl, tradjenta, Rybelsus. This is from SNF [facility]. Not sure why pt is on Rybelsus. She really should be on insulin.  GERD Stable.  Essential hypertension Continue norvasc.  Dyslipidemia Continue lipitor.  Hypothyroidism Stable.  Hypernatremia Use LR for Ivf. Change to 1/2 NS after off insulin gtts. If hypernatremia continues to be a problem, may need to change to D5W.  History of stroke Chronic.   DVT prophylaxis: SQ Heparin Code Status: Full Code Family Communication: discussed with pt's sister Allison [name].  Disposition Plan: return to sister's home(Allison [name])  Consults called: none  Admission status: Observation, Telemetry bed   Carollee Herter, DO [facility] 08/15/2022, 10:56 PM

## 2022-08-15 NOTE — ED Provider Notes (Signed)
Allison [name] Provider Note   CSN: [id number] Arrival date & time: 08/15/22  2014     History Dementia, DM, HTN Chief Complaint  Patient presents with  . Altered Mental Status  . Hyperglycemia    Allison CONSOLI is a 88 y.o. female.  88 year old female with a past medical history of dementia here from [facility] due to altered mental status.  According to facility, patient is at her baseline but family reports that she is less talkative than she usually is.  Family was not at the scene when EMS arrived therefore they were unable to obtain any collateral information.  Patient does answer short brief questions she is ANO x 1.  Level 5 caveat due to dementia.  The history is provided by the patient and medical records.  Altered Mental Status Hyperglycemia Associated symptoms: altered mental status        Home Medications Prior to Admission medications   Medication Sig Start Date End Date Taking? Authorizing Provider  amLODipine (NORVASC) 5 MG tablet TAKE ONE TABLET BY MOUTH DAILY Patient taking differently: Take 5 mg by mouth daily. 03/11/18   Sharon Seller, NP  ascorbic acid (VITAMIN C) 500 MG tablet Take 500 mg by mouth daily. X 10 days 01/03/21   [provider]  ASSURE LANCE LANCETS 21G MISC 1 each by Does not apply route 4 (four) times daily. DX E11.65 01/04/18   Pecola Lawless, MD  atorvastatin (LIPITOR) 40 MG tablet Take 2 tablets (80 mg total) by mouth daily. 01/08/21   Almon Hercules, MD  blood glucose meter kit and supplies Dispense based on patient and insurance preference. Use up to four times daily as directed. (FOR ICD-10 E10.9, E11.9). 01/08/21   Almon Hercules, MD  Blood Glucose Monitoring Suppl (ONETOUCH VERIO) w/Device KIT 1 each by Does not apply route 3 (three) times daily. Use to test blood sugar three times daily. Dx: E11.21 06/15/17   Kirt Boys, DO  cephALEXin (KEFLEX) 500 MG capsule Take 1  capsule (500 mg total) by mouth 3 (three) times daily. 11/09/21   Geoffery Lyons, MD  donepezil (ARICEPT) 10 MG tablet Take 1 tablet (10 mg total) by mouth at bedtime. For memory Patient not taking: Reported on 01/08/2021 03/10/18   Sharon Seller, NP  Glucerna (GLUCERNA) LIQD Take 237 mLs by mouth 2 (two) times daily between meals.    [provider]  glucose blood (ONETOUCH VERIO) test strip Check blood sugar three times daily DX E11.21 (One Touch Verio IQ) 06/15/17   Kirt Boys, DO  HYDROcodone-acetaminophen (NORCO/VICODIN) 5-325 MG tablet Take 1 every 8 hours for pain not helped by Tylenol alone 01/10/22   Bethann Berkshire, MD  insulin aspart (NOVOLOG) 100 UNIT/ML FlexPen Inject 7 Units into the skin 3 (three) times daily with meals. Hold if blood glucose less than 100 01/08/21   Gonfa, Taye T, MD  Insulin Pen Needle (PEN NEEDLES) 31G X 5 MM MISC 1 each by Does not apply route as needed. Use when giving insulin injections. Dx: E11.9 01/08/21   Almon Hercules, MD  levothyroxine (SYNTHROID) 125 MCG tablet Take 125 mcg by mouth daily before breakfast.    [provider]  Melatonin 2.5 MG CHEW Chew 2.5 mg by mouth at bedtime.    [provider]  metFORMIN (GLUCOPHAGE) 1000 MG tablet Take 1 tablet (1,000 mg total) by mouth 2 (two) times daily with a meal. 01/08/21  Almon Hercules, MD  OLANZapine (ZYPREXA) 2.5 MG tablet Take 2.5 mg by mouth at bedtime.    [provider]  pantoprazole (PROTONIX) 20 MG tablet Take 1 tablet (20 mg total) by mouth daily. 01/10/22   Bethann Berkshire, MD  sennosides-docusate sodium (SENOKOT-S) 8.6-50 MG tablet Take 2 tablets by mouth daily as needed for constipation.    [provider]  sucralfate (CARAFATE) 1 g tablet Take 1 tablet (1 g total) by mouth 4 (four) times daily -  with meals and at bedtime for 7 days. 06/01/21 06/08/21  Tanda Rockers A, DO  zinc gluconate 50 MG tablet Take 50 mg by mouth daily.    [provider]       Allergies    Lantus [insulin glargine], Levemir [insulin detemir], Pineapple, Codeine, Invokana [canagliflozin], Penicillins, Sulfa antibiotics, Tradjenta [linagliptin], Vioxx [rofecoxib], and Exelon [rivastigmine]    Review of Systems   Review of Systems  Unable to perform ROS: Dementia    Physical Exam Updated Vital Signs BP (!) 174/107   Pulse (!) 104   Temp 98.4 F (36.9 C) (Oral)   Resp 20   SpO2 98%  Physical Exam Vitals and nursing note reviewed.  Constitutional:      Comments: Frail.   HENT:     Head: Normocephalic and atraumatic.     Comments: No signs of trauma, no goose eggs noted.     Mouth/Throat:     Mouth: Mucous membranes are dry.  Eyes:     Pupils: Pupils are equal, round, and reactive to light.     Comments: Pupils are equal and reactive.   Cardiovascular:     Rate and Rhythm: Tachycardia present.     Comments: HR in the 100-112's Pulmonary:     Effort: Pulmonary effort is normal.     Breath sounds: No wheezing or rhonchi.     Comments: No audible wheezing or rhonchi.  Abdominal:     General: Abdomen is flat.     Palpations: Abdomen is soft.     Tenderness: There is no abdominal tenderness.     Comments: Bowel sounds are present in all four quadrants.   Musculoskeletal:     Cervical back: Normal range of motion and neck supple.  Skin:    General: Skin is warm and dry.  Neurological:     Mental Status: She is alert and oriented to person, place, and time.     ED Results / Procedures / Treatments   Labs (all labs ordered are listed, but only abnormal results are displayed) Labs Reviewed  BASIC METABOLIC PANEL - Abnormal; Notable for the following components:      Result Value   Sodium 150 (*)    CO2 16 (*)    Glucose, Bld 589 (*)    BUN 38 (*)    Creatinine, Ser 1.76 (*)    Calcium 10.4 (*)    GFR, Estimated 28 (*)    Anion gap 23 (*)    All other components within normal limits  CBC - Abnormal; Notable for the following  components:   WBC 18.0 (*)    RBC 5.17 (*)    All other components within normal limits  URINALYSIS, ROUTINE W REFLEX MICROSCOPIC - Abnormal; Notable for the following components:   Color, Urine STRAW (*)    Glucose, UA >=500 (*)    Hgb urine dipstick MODERATE (*)    Ketones, ur 80 (*)    Protein, ur 100 (*)  All other components within normal limits  BETA-HYDROXYBUTYRIC ACID - Abnormal; Notable for the following components:   Beta-Hydroxybutyric Acid >8.00 (*)    All other components within normal limits  CBG MONITORING, ED - Abnormal; Notable for the following components:   Glucose-Capillary 530 (*)    All other components within normal limits  I-STAT VENOUS BLOOD GAS, ED    EKG EKG Interpretation Date/Time:  Saturday August 15 2022 20:26:18 EDT Ventricular Rate:  110 PR Interval:  166 QRS Duration:  75 QT Interval:  349 QTC Calculation: 473 R Axis:   -9  Text Interpretation: Sinus tachycardia Borderline T abnormalities, inferior leads No significant change since last tracing Confirmed by Gwyneth Sprout ([id number]) on 08/15/2022 9:24:11 PM  Radiology DG Chest Portable 1 View  Result Date: 08/15/2022 CLINICAL DATA:  Altered mental status EXAM: PORTABLE CHEST 1 VIEW COMPARISON:  01/10/2022 FINDINGS: Lung volumes are small and there is bibasilar atelectasis. No pneumothorax or pleural effusion. Cardiac size within normal limits. Pulmonary vascularity is normal. No acute bone abnormality. IMPRESSION: 1. Pulmonary hypoinflation. Electronically Signed   By: Helyn Numbers M.D.   On: 08/15/2022 21:17    Procedures .Critical Care  Performed by: Claude Manges, PA-C Authorized by: Claude Manges, PA-C   Critical care provider statement:    Critical care time (minutes):  30   Critical care start time:  08/15/2022 9:00 PM   Critical care end time:  08/15/2022 9:45 PM   Critical care was necessary to treat or prevent imminent or life-threatening deterioration of the following conditions:   Endocrine crisis   Critical care was time spent personally by me on the following activities:  Development of treatment plan with patient or surrogate, discussions with consultants, evaluation of patient's response to treatment, examination of patient, ordering and review of laboratory studies, ordering and review of radiographic studies, ordering and performing treatments and interventions, pulse oximetry, re-evaluation of patient's condition and review of old charts     Medications Ordered in ED Medications  lactated ringers bolus 20 mL/kg (has no administration in time range)  insulin regular, human (MYXREDLIN) 100 units/ 100 mL infusion (has no administration in time range)  lactated ringers infusion (has no administration in time range)  dextrose 5 % in lactated ringers infusion (has no administration in time range)  dextrose 50 % solution 0-50 mL (has no administration in time range)  potassium chloride 10 mEq in 100 mL IVPB (has no administration in time range)  sodium chloride 0.9 % bolus 1,000 mL (1,000 mLs Intravenous New Bag/Given 08/15/22 2146)    ED Course/ Medical Decision Making/ A&P Clinical Course as of 08/15/22 2226  Sat Aug 15, 2022  2129 Anion gap(!): 23 [JS]  2129 WBC(!): 18.0 [JS]  2129 Sodium(!): 150 [JS]  2136 Hgb urine dipstickMarland Kitchen): MODERATE [JS]  2226 Beta-Hydroxybutyric Acid(!): >8.00 [JS]    Clinical Course User Index [JS] Claude Manges, PA-C                                 Medical Decision Making Amount and/or Complexity of Data Reviewed Labs: ordered. Decision-making details documented in ED Course. Radiology: ordered.  Risk Prescription drug management.   This patient presents to the ED for concern of AMS, this involves a number of treatment options, and is a complaint that carries with it a high risk of complications and morbidity.  The differential diagnosis includes UTI, DKA versus dehydration.   Co  morbidities: Discussed in HPI   Brief  History:  See HPI  EMR reviewed including pt PMHx, past surgical history and past visits to ER.   See HPI for more details   Lab Tests:  I ordered and independently interpreted labs.  The pertinent results include:    Labs notable for CBC with a leukocytosis of 18,000, hemoglobin is stable.  Bmp with hypernatremia, glucose is 589, anion gap of 23 and worsening creatinine concern for DKA at this time.  BUN is also elevated at 38, likely due to her dehydration.  UA with greater than 500 glucose noted, also ketones present.   Imaging Studies:  NAD. I personally reviewed all imaging studies and no acute abnormality found. I agree with radiology interpretation.  Cardiac Monitoring:  .The patient was maintained on a cardiac monitor.  I personally viewed and interpreted the cardiac monitored which showed an underlying rhythm of: Sinus tachycardia .EKG non-ischemic   Medicines ordered:  I ordered medication including bolus  for hydration Reevaluation of the patient after these medicines showed that the patient stayed the same I have reviewed the patients home medicines and have made adjustments as needed   Critical Interventions:  .Patient hyperglycemic and in DKA at this time, prior allergy to insulin verified with pharmacy.  Reevaluation:  After the interventions noted above I re-evaluated patient and found that they have :stayed the same   Social Determinants of Health:  .The patient's social determinants of health were a factor in the care of this patient   Problem List / ED Course:  Patient here with underlying history of hypertension, diabetes presents to the ED for altered mental status.  Patient was visited by family earlier today, they felt that she looked somewhat different than she usually does.  They were not at the scene, therefore facility feels that patient looks about the same as she always does.  She arrived to the ED with a slight elevation of her heart rate  around the 100s to 112, no hypoxia.  She is ANO x 1 for me.  Sister is at the bedside reporting that patient is usually more talkative, more alert, now she is just staring into space per sister. CBC without leukocytosis of 18,000, BMP with hypernatremia, glucose of almost 600, anion gap of 23 and BUN of 38, likely in DKA at this time.  Fluids have been started, she does have a prior allergy to long term insulin.  UA also with ketones present along with greater than 500 glucose.  VBG, beta hydroxybutyric is pending.  Last echocardiogram approximately 5 years ago with an EF of 55-60%/ X-ray of her chest without any signs of pneumonia.  She is not hypoxic, heart rate is slightly elevated and no tachypnea present. Patient has a allergy to insulin noted on her chart, however received NovoLog back in 2013.  I verified with pharmacist that patient can receive insulin at this time as she is in full-blown DKA.  Insulin regular has been ordered for patient and will monitor closely.  Her sister is at the bedside reporting "the person that sitting on the bed is not my sister ".  She is otherwise hemodynamically stable for admission for further treatment of her DKA worsening her altered mental status. I discussed this case with my attending Dr. Anitra Lauth who has evaluated patient and agrees with plan and management. Spoke to Dr. Cherly Hensen who will admit patient for further management.  Dispostion:  After consideration of the diagnostic results and the patients  response to treatment, I feel that the patent would benefit from admission for further management of DKA.   Portions of this note were generated with Scientist, clinical (histocompatibility and immunogenetics). Dictation errors may occur despite best attempts at proofreading.   Final Clinical Impression(s) / ED Diagnoses Final diagnoses:  Diabetic ketoacidosis without coma associated with type 2 diabetes mellitus [facility])    Rx / DC Orders ED Discharge Orders     None         Claude Manges, PA-C 08/15/22 2226    Gwyneth Sprout, MD 08/16/22 [phone]

## 2022-08-15 NOTE — Assessment & Plan Note (Signed)
Continue norvasc 

## 2022-08-15 NOTE — Assessment & Plan Note (Signed)
Pt on amaryl, tradjenta, Rybelsus. This is from SNF [facility]. Not sure why pt is on Rybelsus. She really should be on insulin.

## 2022-08-15 NOTE — Assessment & Plan Note (Signed)
Observation progressive bed. Start insulin protocol for DKA.

## 2022-08-16 ENCOUNTER — Encounter (HOSPITAL_COMMUNITY): Payer: Self-pay | Admitting: Internal Medicine

## 2022-08-16 DIAGNOSIS — E1165 Type 2 diabetes mellitus with hyperglycemia: Secondary | ICD-10-CM | POA: Diagnosis not present

## 2022-08-16 DIAGNOSIS — G308 Other Alzheimer's disease: Secondary | ICD-10-CM

## 2022-08-16 DIAGNOSIS — Z7984 Long term (current) use of oral hypoglycemic drugs: Secondary | ICD-10-CM | POA: Diagnosis not present

## 2022-08-16 DIAGNOSIS — G309 Alzheimer's disease, unspecified: Secondary | ICD-10-CM | POA: Diagnosis present

## 2022-08-16 DIAGNOSIS — Z83511 Family history of glaucoma: Secondary | ICD-10-CM | POA: Diagnosis not present

## 2022-08-16 DIAGNOSIS — E785 Hyperlipidemia, unspecified: Secondary | ICD-10-CM | POA: Diagnosis present

## 2022-08-16 DIAGNOSIS — E870 Hyperosmolality and hypernatremia: Secondary | ICD-10-CM

## 2022-08-16 DIAGNOSIS — N179 Acute kidney failure, unspecified: Secondary | ICD-10-CM | POA: Diagnosis present

## 2022-08-16 DIAGNOSIS — Z803 Family history of malignant neoplasm of breast: Secondary | ICD-10-CM | POA: Diagnosis not present

## 2022-08-16 DIAGNOSIS — E1110 Type 2 diabetes mellitus with ketoacidosis without coma: Secondary | ICD-10-CM | POA: Diagnosis present

## 2022-08-16 DIAGNOSIS — Z79890 Hormone replacement therapy (postmenopausal): Secondary | ICD-10-CM | POA: Diagnosis not present

## 2022-08-16 DIAGNOSIS — Z8673 Personal history of transient ischemic attack (TIA), and cerebral infarction without residual deficits: Secondary | ICD-10-CM | POA: Diagnosis not present

## 2022-08-16 DIAGNOSIS — Z794 Long term (current) use of insulin: Secondary | ICD-10-CM | POA: Diagnosis not present

## 2022-08-16 DIAGNOSIS — Z825 Family history of asthma and other chronic lower respiratory diseases: Secondary | ICD-10-CM | POA: Diagnosis not present

## 2022-08-16 DIAGNOSIS — E039 Hypothyroidism, unspecified: Secondary | ICD-10-CM

## 2022-08-16 DIAGNOSIS — F0280 Dementia in other diseases classified elsewhere without behavioral disturbance: Secondary | ICD-10-CM

## 2022-08-16 DIAGNOSIS — Z7189 Other specified counseling: Secondary | ICD-10-CM

## 2022-08-16 DIAGNOSIS — E876 Hypokalemia: Secondary | ICD-10-CM | POA: Diagnosis not present

## 2022-08-16 DIAGNOSIS — E860 Dehydration: Secondary | ICD-10-CM | POA: Diagnosis present

## 2022-08-16 DIAGNOSIS — G9341 Metabolic encephalopathy: Secondary | ICD-10-CM | POA: Diagnosis present

## 2022-08-16 DIAGNOSIS — I10 Essential (primary) hypertension: Secondary | ICD-10-CM

## 2022-08-16 DIAGNOSIS — Z820 Family history of epilepsy and other diseases of the nervous system: Secondary | ICD-10-CM | POA: Diagnosis not present

## 2022-08-16 DIAGNOSIS — F02C0 Dementia in other diseases classified elsewhere, severe, without behavioral disturbance, psychotic disturbance, mood disturbance, and anxiety: Secondary | ICD-10-CM | POA: Diagnosis present

## 2022-08-16 DIAGNOSIS — K219 Gastro-esophageal reflux disease without esophagitis: Secondary | ICD-10-CM | POA: Diagnosis present

## 2022-08-16 DIAGNOSIS — Z91148 Patient's other noncompliance with medication regimen for other reason: Secondary | ICD-10-CM | POA: Diagnosis not present

## 2022-08-16 DIAGNOSIS — E875 Hyperkalemia: Secondary | ICD-10-CM | POA: Diagnosis present

## 2022-08-16 DIAGNOSIS — Z90711 Acquired absence of uterus with remaining cervical stump: Secondary | ICD-10-CM | POA: Diagnosis not present

## 2022-08-16 HISTORY — DX: Type 2 diabetes mellitus with ketoacidosis without coma: E11.10

## 2022-08-16 LAB — BASIC METABOLIC PANEL
Anion gap: 14 (ref 5–15)
Anion gap: 13 (ref 5–15)
Anion gap: 13 (ref 5–15)
BUN: 30 mg/dL — ABNORMAL HIGH (ref 8–23)
BUN: 26 mg/dL — ABNORMAL HIGH (ref 8–23)
BUN: 21 mg/dL (ref 8–23)
CO2: 22 mmol/L (ref 22–32)
CO2: 24 mmol/L (ref 22–32)
CO2: 25 mmol/L (ref 22–32)
Calcium: 10.3 mg/dL (ref 8.9–10.3)
Calcium: 10.1 mg/dL (ref 8.9–10.3)
Calcium: 10.2 mg/dL (ref 8.9–10.3)
Chloride: 120 mmol/L — ABNORMAL HIGH (ref 98–111)
Chloride: 120 mmol/L — ABNORMAL HIGH (ref 98–111)
Chloride: 119 mmol/L — ABNORMAL HIGH (ref 98–111)
Creatinine, Ser: 1.35 mg/dL — ABNORMAL HIGH (ref 0.44–1.00)
Creatinine, Ser: 1.12 mg/dL — ABNORMAL HIGH (ref 0.44–1.00)
Creatinine, Ser: 1.06 mg/dL — ABNORMAL HIGH (ref 0.44–1.00)
GFR, Estimated: 39 mL/min — ABNORMAL LOW (ref >60)
GFR, Estimated: 48 mL/min — ABNORMAL LOW (ref >60)
GFR, Estimated: 51 mL/min — ABNORMAL LOW (ref >60)
Glucose, Bld: 234 mg/dL — ABNORMAL HIGH (ref 70–99)
Glucose, Bld: 228 mg/dL — ABNORMAL HIGH (ref 70–99)
Glucose, Bld: 180 mg/dL — ABNORMAL HIGH (ref 70–99)
Potassium: 3.2 mmol/L — ABNORMAL LOW (ref 3.5–5.1)
Potassium: 3.2 mmol/L — ABNORMAL LOW (ref 3.5–5.1)
Potassium: 3.6 mmol/L (ref 3.5–5.1)
Sodium: 156 mmol/L — ABNORMAL HIGH (ref 135–145)
Sodium: 157 mmol/L — ABNORMAL HIGH (ref 135–145)
Sodium: 157 mmol/L — ABNORMAL HIGH (ref 135–145)

## 2022-08-16 LAB — GLUCOSE, CAPILLARY
Glucose-Capillary: 219 mg/dL — ABNORMAL HIGH (ref 70–99)
Glucose-Capillary: 209 mg/dL — ABNORMAL HIGH (ref 70–99)
Glucose-Capillary: 226 mg/dL — ABNORMAL HIGH (ref 70–99)
Glucose-Capillary: 193 mg/dL — ABNORMAL HIGH (ref 70–99)
Glucose-Capillary: 211 mg/dL — ABNORMAL HIGH (ref 70–99)
Glucose-Capillary: 202 mg/dL — ABNORMAL HIGH (ref 70–99)
Glucose-Capillary: 222 mg/dL — ABNORMAL HIGH (ref 70–99)
Glucose-Capillary: 194 mg/dL — ABNORMAL HIGH (ref 70–99)
Glucose-Capillary: 199 mg/dL — ABNORMAL HIGH (ref 70–99)
Glucose-Capillary: 173 mg/dL — ABNORMAL HIGH (ref 70–99)
Glucose-Capillary: 175 mg/dL — ABNORMAL HIGH (ref 70–99)
Glucose-Capillary: 114 mg/dL — ABNORMAL HIGH (ref 70–99)
Glucose-Capillary: 229 mg/dL — ABNORMAL HIGH (ref 70–99)

## 2022-08-16 LAB — RENAL FUNCTION PANEL
Albumin: 3.3 g/dL — ABNORMAL LOW (ref 3.5–5.0)
Anion gap: 10 (ref 5–15)
BUN: 18 mg/dL (ref 8–23)
CO2: 24 mmol/L (ref 22–32)
Calcium: 9.7 mg/dL (ref 8.9–10.3)
Chloride: 119 mmol/L — ABNORMAL HIGH (ref 98–111)
Creatinine, Ser: 1.02 mg/dL — ABNORMAL HIGH (ref 0.44–1.00)
GFR, Estimated: 54 mL/min — ABNORMAL LOW
Glucose, Bld: 126 mg/dL — ABNORMAL HIGH (ref 70–99)
Phosphorus: <1 mg/dL — CL (ref 2.5–4.6)
Potassium: 3.3 mmol/L — ABNORMAL LOW (ref 3.5–5.1)
Sodium: 153 mmol/L — ABNORMAL HIGH (ref 135–145)

## 2022-08-16 LAB — TSH: TSH: 16.149 u[IU]/mL — ABNORMAL HIGH (ref 0.350–4.500)

## 2022-08-16 LAB — BETA-HYDROXYBUTYRIC ACID
Beta-Hydroxybutyric Acid: 3.02 mmol/L — ABNORMAL HIGH (ref 0.05–0.27)
Beta-Hydroxybutyric Acid: 0.61 mmol/L — ABNORMAL HIGH (ref 0.05–0.27)

## 2022-08-16 LAB — MRSA NEXT GEN BY PCR, NASAL: MRSA by PCR Next Gen: NOT DETECTED

## 2022-08-16 LAB — HEMOGLOBIN A1C
Hgb A1c MFr Bld: 11.5 % — ABNORMAL HIGH (ref 4.8–5.6)
Mean Plasma Glucose: 283.35 mg/dL

## 2022-08-16 LAB — CBG MONITORING, ED: Glucose-Capillary: 280 mg/dL — ABNORMAL HIGH (ref 70–99)

## 2022-08-16 LAB — MAGNESIUM: Magnesium: 2 mg/dL (ref 1.7–2.4)

## 2022-08-16 MED ORDER — POTASSIUM CHLORIDE 10 MEQ/100ML IV SOLN
10.0000 meq | INTRAVENOUS | Status: AC
  Administered 2022-08-16 (×4): 10 meq via INTRAVENOUS
  Filled 2022-08-16 (×4): qty 100

## 2022-08-16 MED ORDER — ORAL CARE MOUTH RINSE
15.0000 mL | OROMUCOSAL | Status: DC
  Administered 2022-08-16 – 2022-08-18 (×7): 15 mL via OROMUCOSAL

## 2022-08-16 MED ORDER — OLANZAPINE 5 MG PO TBDP: 5.0000 mg | ORAL_TABLET | Freq: Every evening | ORAL | Status: DC | PRN

## 2022-08-16 MED ORDER — AMLODIPINE BESYLATE 5 MG PO TABS
5.0000 mg | ORAL_TABLET | Freq: Every day | ORAL | Status: DC
  Administered 2022-08-16: 5 mg via ORAL
  Filled 2022-08-16: qty 1

## 2022-08-16 MED ORDER — INSULIN NPH (HUMAN) (ISOPHANE) 100 UNIT/ML ~~LOC~~ SUSP: 10.0000 [IU] | Freq: Two times a day (BID) | SUBCUTANEOUS | Status: DC

## 2022-08-16 MED ORDER — HEPARIN SODIUM (PORCINE) 5000 UNIT/ML IJ SOLN
5000.0000 [IU] | Freq: Three times a day (TID) | INTRAMUSCULAR | Status: DC
  Administered 2022-08-16 – 2022-08-18 (×8): 5000 [IU] via SUBCUTANEOUS
  Filled 2022-08-16 (×9): qty 1

## 2022-08-16 MED ORDER — GLUCERNA SHAKE PO LIQD
237.0000 mL | Freq: Three times a day (TID) | ORAL | Status: DC
  Administered 2022-08-17 – 2022-08-18 (×5): 237 mL via ORAL

## 2022-08-16 MED ORDER — INSULIN NPH (HUMAN) (ISOPHANE) 100 UNIT/ML ~~LOC~~ SUSP
10.0000 [IU] | Freq: Two times a day (BID) | SUBCUTANEOUS | Status: DC
  Administered 2022-08-16: 10 [IU] via SUBCUTANEOUS
  Filled 2022-08-16: qty 10

## 2022-08-16 MED ORDER — INSULIN ASPART 100 UNIT/ML IJ SOLN
0.0000 [IU] | Freq: Three times a day (TID) | INTRAMUSCULAR | Status: DC
  Administered 2022-08-16: 3 [IU] via SUBCUTANEOUS
  Administered 2022-08-17: 5 [IU] via SUBCUTANEOUS
  Administered 2022-08-17: 11 [IU] via SUBCUTANEOUS
  Administered 2022-08-17 – 2022-08-18 (×3): 5 [IU] via SUBCUTANEOUS

## 2022-08-16 MED ORDER — POTASSIUM CHLORIDE IN NACL 20-0.45 MEQ/L-% IV SOLN
INTRAVENOUS | Status: AC
  Filled 2022-08-16 (×3): qty 1000

## 2022-08-16 MED ORDER — ACETAMINOPHEN 325 MG PO TABS: 650.0000 mg | ORAL_TABLET | Freq: Four times a day (QID) | ORAL | Status: DC | PRN

## 2022-08-16 MED ORDER — ORAL CARE MOUTH RINSE: 15.0000 mL | OROMUCOSAL | Status: DC | PRN

## 2022-08-16 MED ORDER — ATORVASTATIN CALCIUM 80 MG PO TABS
80.0000 mg | ORAL_TABLET | Freq: Every day | ORAL | Status: DC
  Administered 2022-08-16 – 2022-08-18 (×3): 80 mg via ORAL
  Filled 2022-08-16 (×3): qty 1

## 2022-08-16 MED ORDER — INSULIN ASPART 100 UNIT/ML IJ SOLN
0.0000 [IU] | Freq: Every day | INTRAMUSCULAR | Status: DC
  Administered 2022-08-16: 2 [IU] via SUBCUTANEOUS

## 2022-08-16 MED ORDER — LEVOTHYROXINE SODIUM 25 MCG PO TABS
125.0000 ug | ORAL_TABLET | Freq: Every day | ORAL | Status: DC
  Administered 2022-08-18: 125 ug via ORAL
  Filled 2022-08-16 (×3): qty 1

## 2022-08-16 MED ORDER — ONDANSETRON HCL 4 MG/2ML IJ SOLN: 4.0000 mg | Freq: Four times a day (QID) | INTRAMUSCULAR | Status: DC | PRN

## 2022-08-16 MED ORDER — SODIUM CHLORIDE 0.45 % IV SOLN: INTRAVENOUS | Status: DC

## 2022-08-16 MED ORDER — POTASSIUM PHOSPHATES 15 MMOLE/5ML IV SOLN
30.0000 mmol | Freq: Once | INTRAVENOUS | Status: AC
  Administered 2022-08-16: 30 mmol via INTRAVENOUS
  Filled 2022-08-16: qty 10

## 2022-08-16 MED ORDER — INSULIN ASPART 100 UNIT/ML IJ SOLN: 3.0000 [IU] | Freq: Three times a day (TID) | INTRAMUSCULAR | Status: DC

## 2022-08-16 NOTE — Progress Notes (Signed)
Potassium 3.2 Text page Dr. Loney Loh the results Tim RN aware.

## 2022-08-16 NOTE — Progress Notes (Signed)
Patient received via stretcher with E.R. Nurse. Alert and orin. To name only . Cooperatives most of the time.  Tim Charity fundraiser into do assessment and will manage End[employer]. Attempted to orin. To room and call bell. No complaints voiced

## 2022-08-16 NOTE — Progress Notes (Signed)
PROGRESS NOTE  Allison [name] ZYS:[id number] DOB: 06/10/1934   PCP: [facility]  Patient is from: Nursing home (respite care)  DOA: 08/15/2022 LOS: 0  Chief complaints Chief Complaint  Patient presents with   Altered Mental Status   Hyperglycemia     Brief Narrative / Interim history: 88 year old F with PMH of severe dementia, DM-2, hypothyroidism, CVA, HTN, reflux and HLD brought to ED from nursing home due to altered mental status, and admitted for DKA, AKI and hypernatremia.  Started on insulin drip and IV fluid.  The next day, transitioned to subcu insulin but very poor p.o. intake.  Still with hypernatremia.  AKI improving.   Subjective: Seen and examined earlier this morning.  No major events overnight of this morning.  Patient with severe dementia.  Not talking much.  She says no when asked if she is hungry.  Not answering orientation questions.  Does not appear to be in distress.  Objective: Vitals:   08/16/22 0200 08/16/22 0400 08/16/22 0745 08/16/22 1155  BP: (!) 155/80 (!) 152/81 (!) 150/80 (!) 153/74  Pulse: (!) 105 98 71 (!) 106  Resp: 19 20 16 18   Temp: (!) 97.4 F (36.3 C) 98.4 F (36.9 C) 98.2 F (36.8 C) 98.8 F (37.1 C)  TempSrc: Oral Oral Axillary Oral  SpO2: 100% 96% 99% 100%  Weight: 64.1 kg     Height:        Examination:  GENERAL: No apparent distress.  Nontoxic. HEENT: MMM.  Vision and hearing grossly intact.  NECK: Supple.  No apparent JVD.  RESP:  No IWOB.  Fair aeration bilaterally. CVS:  RRR. Heart sounds normal.  ABD/GI/GU: BS+. Abd soft, NTND.  MSK/EXT:  Moves extremities. No apparent deformity. No edema.  SKIN: no apparent skin lesion or wound NEURO: Awake and alert.  Not able to assess orientation.  Does not follow command.  No apparent focal neuro deficit. PSYCH: Calm. Normal affect.   Procedures:  None  Microbiology summarized: None  Assessment and plan: Principal Problem:   DKA, type 2  (HCC) Active Problems:   Family discord   Hypothyroidism   Dyslipidemia   Essential hypertension   GERD   Diabetes mellitus with hyperglycemia (HCC)   Alzheimer's dementia (HCC)   History of stroke   Hypernatremia  Uncontrolled NIDDM-2 with diabetic ketoacidosis: A1c 11.5%.  DKA resolved.  On Amaryl, Tradjenta and Rybelsus Recent Labs  Lab 08/16/22 0659 08/16/22 0804 08/16/22 0910 08/16/22 1024 08/16/22 1130  GLUCAP 222* 194* 199* 173* 175*  -Transition to subcu insulin.  May have to hold basal due to poor p.o. intake. -Continue half-normal saline with KCl for hyponatremia and hyperkalemia. -Monitor CBG -Liberating diet to regular given poor p.o. intake.  AKI: Baseline creatinine about 0.9.  Likely prerenal in the setting of poor p.o. intake and DKA.  Resolving. Recent Labs    11/09/21 0345 01/10/22 1510 06/23/22 0011 07/19/22 1750 08/15/22 2036 08/16/22 0218 08/16/22 0538 08/16/22 0943  BUN 13 8 16 10  38* 30* 26* 21  CREATININE 0.84 0.72 1.11* 0.91 1.76* 1.35* 1.12* 1.06*  -Continue monitoring -Continue IV fluid  Hypernatremia: Na 150 and corrects to 164 hyperglycemia.  Remains elevated. -Change LR to half-normal saline -Monitor every 8 hours.  Acute metabolic encephalopathy: Probably from dehydration and DKA.  Also have severe dementia. Severe Alzheimer's dementia without behavioral disturbance: Awake and alert but does not answer orientation question or follow command.  Inconsistently responds to questions.  Does not  appear to have focal neurodeficit. -Reorientation and delirium precaution.   Essential hypertension: BP slightly elevated. -Continue Norvasc.  History of CVA/dyslipidemia -Continue lipitor.   Hypothyroidism -Check TSH. -Continue Synthroid  Hypokalemia -Monitor replenish as appropriate   Goal of care counseling: Attempted to call patient's son, Jael Waldorf but no answer.  I was able to talk to patient's sister, Dennie Bible who patient stays  with before she went to nursing home for respite care.  Per Dennie Bible, patient has been pretty functional ambulating with cane and going to a restaurant to eat together.  Lately, she started talking to "our dead family, refusing to eat or take medications and threatening to stab".  When asked about code status, Dennie Bible states patient's son Tinnie Gens is patient's HCPOA to make those decisions.  Unfortunately, I was not able to reach [place].  I offered palliative care consult and she agreed.  Body mass index is 24.26 kg/m.           DVT prophylaxis:  heparin injection 5,000 Units Start: 08/16/22 0030 SCDs Start: 08/16/22 0016  Code Status: Full code by default Family Communication: Updated patient's sister, Dennie Bible over the phone.  Not able to reach patient's son Tinnie Gens over the phone. Level of care: Progressive Status is: Observation The patient will require care spanning > 2 midnights and should be moved to inpatient because: Encephalopathy, DKA, hyponatremia and AKI   Final disposition: Per sister, likely home with sister Consultants:  Palliative medicine  55 minutes with more than 50% spent in reviewing records, counseling patient/family and coordinating care.   Sch Meds:  Scheduled Meds:  amLODipine  5 mg Oral Daily   atorvastatin  80 mg Oral Daily   heparin  5,000 Units Subcutaneous Q8H   insulin aspart  0-15 Units Subcutaneous TID WC   insulin aspart  0-5 Units Subcutaneous QHS   insulin aspart  3 Units Subcutaneous TID WC   insulin NPH Human  10 Units Subcutaneous BID AC & HS   levothyroxine  125 mcg Oral QAC breakfast   mouth rinse  15 mL Mouth Rinse 4 times per day   Continuous Infusions:  0.45 % NaCl with KCl 20 mEq / L 100 mL/hr at 08/16/22 1059   insulin 2 Units/hr (08/16/22 1100)   PRN Meds:.acetaminophen, dextrose, OLANZapine zydis, ondansetron (ZOFRAN) IV, mouth rinse  Antimicrobials: Anti-infectives (From admission, onward)    None        I have personally  reviewed the following labs and images: CBC: Recent Labs  Lab 08/15/22 2036 08/15/22 2232  WBC 18.0*  --   HGB 13.6 14.3  HCT 43.0 42.0  MCV 83.2  --   PLT 300  --    BMP &GFR Recent Labs  Lab 08/15/22 2036 08/15/22 2232 08/16/22 0218 08/16/22 0538 08/16/22 0943  NA 150* 153* 156* 157* 157*  K 4.0 4.9 3.2* 3.2* 3.6  CL 111  --  120* 120* 119*  CO2 16*  --  22 24 25   GLUCOSE 589*  --  234* 228* 180*  BUN 38*  --  30* 26* 21  CREATININE 1.76*  --  1.35* 1.12* 1.06*  CALCIUM 10.4*  --  10.3 10.1 10.2   Estimated Creatinine Clearance: 33.5 mL/min (A) (by C-G formula based on SCr of 1.06 mg/dL (H)). Liver & Pancreas: No results for input(s): "AST", "ALT", "ALKPHOS", "BILITOT", "PROT", "ALBUMIN" in the last 168 hours. No results for input(s): "LIPASE", "AMYLASE" in the last 168 hours. No results for input(s): "AMMONIA" in  the last 168 hours. Diabetic: Recent Labs    08/16/22 0218  HGBA1C 11.5*   Recent Labs  Lab 08/16/22 0659 08/16/22 0804 08/16/22 0910 08/16/22 1024 08/16/22 1130  GLUCAP 222* 194* 199* 173* 175*   Cardiac Enzymes: No results for input(s): "CKTOTAL", "CKMB", "CKMBINDEX", "TROPONINI" in the last 168 hours. No results for input(s): "PROBNP" in the last 8760 hours. Coagulation Profile: No results for input(s): "INR", "PROTIME" in the last 168 hours. Thyroid Function Tests: No results for input(s): "TSH", "T4TOTAL", "FREET4", "T3FREE", "THYROIDAB" in the last 72 hours. Lipid Profile: No results for input(s): "CHOL", "HDL", "LDLCALC", "TRIG", "CHOLHDL", "LDLDIRECT" in the last 72 hours. Anemia Panel: No results for input(s): "VITAMINB12", "FOLATE", "FERRITIN", "TIBC", "IRON", "RETICCTPCT" in the last 72 hours. Urine analysis:    Component Value Date/Time   COLORURINE STRAW (A) 08/15/2022 2105   APPEARANCEUR CLEAR 08/15/2022 2105   LABSPEC 1.022 08/15/2022 2105   PHURINE 5.0 08/15/2022 2105   GLUCOSEU >=500 (A) 08/15/2022 2105   HGBUR MODERATE  (A) 08/15/2022 2105   BILIRUBINUR NEGATIVE 08/15/2022 2105   KETONESUR 80 (A) 08/15/2022 2105   PROTEINUR 100 (A) 08/15/2022 2105   UROBILINOGEN 0.2 03/29/2014 1055   NITRITE NEGATIVE 08/15/2022 2105   LEUKOCYTESUR NEGATIVE 08/15/2022 2105   Sepsis Labs: Invalid input(s): "PROCALCITONIN", "LACTICIDVEN"  Microbiology: Recent Results (from the past 240 hour(s))  MRSA Next Gen by PCR, Nasal     Status: None   Collection Time: 08/16/22  2:45 AM   Specimen: Nasal Mucosa; Nasal Swab  Result Value Ref Range Status   MRSA by PCR Next Gen NOT DETECTED NOT DETECTED Final    Comment: (NOTE) The GeneXpert MRSA Assay (FDA approved for NASAL specimens only), is one component of a comprehensive MRSA colonization surveillance program. It is not intended to diagnose MRSA infection nor to guide or monitor treatment for MRSA infections. Test performance is not FDA approved in patients less than 36 years old. Performed at [facility], [address], [place], [place] [address]     Radiology Studies: DG Chest Portable 1 View  Result Date: 08/15/2022 CLINICAL DATA:  Altered mental status EXAM: PORTABLE CHEST 1 VIEW COMPARISON:  01/10/2022 FINDINGS: Lung volumes are small and there is bibasilar atelectasis. No pneumothorax or pleural effusion. Cardiac size within normal limits. Pulmonary vascularity is normal. No acute bone abnormality. IMPRESSION: 1. Pulmonary hypoinflation. Electronically Signed   By: Helyn Numbers M.D.   On: 08/15/2022 21:17       T.  [facility]  If 7PM-7AM, please contact night-coverage [email or web address] 08/16/2022, 12:32 PM

## 2022-08-16 NOTE — Consult Note (Signed)
Consultation Note Date: 08/16/2022   Patient Name: Allison [name]  DOB: Jun 02, 1934  MRN: [id number]  Age / Sex: 88 y.o., female  PCP[facility] Referring Physician: Almon Hercules, MD  Reason for Consultation: Establishing goals of care  HPI/Patient Profile: 88 y.o. female  with past medical history of severe dementia, DM 2, HTN/HLD, history of stroke, GERD, hypothyroid, admitted on 08/15/2022 with DKA, type II, AKI resolved, dementia.   Clinical Assessment and Goals of Care: I have reviewed medical records including EPIC notes, labs and imaging, received report from RN, assessed the patient.  Mrs. Cammack is lying quietly in bed.  She appears chronically ill and somewhat frail.  She is resting comfortably and does not wake when I shake her arm and call her name.  I do not believe that she can make her basic needs known.  There is no family at bedside at this time.  Call to son/HCPOA, Liston Alba, to discuss diagnosis prognosis, GOC, EOL wishes, disposition and options.  I introduced Palliative Medicine as specialized medical care for people living with serious illness. It focuses on providing relief from the symptoms and stress of a serious illness. The goal is to improve quality of life for both the patient and the family.  We discussed a brief life review of the patient.  Mrs. Lastinger is active with [facility].  She has been living with her sister in the family home.  She had gone to [facility] for respite stay on August 2.  Tinnie Gens shares that sister Alexia Freestone has become fatigued with caring for Mrs. Varricchio as she was having incontinence and staying up in the nighttime  We then focused on their current illness.  We talk about Mrs. Echavarria's dementia as a progressive disease.  We talk about her blood sugars with A1c of 11.5.  We discussed A1c trending.  I encouraged  Tinnie Gens to ask/work closely with [facility] for goal setting for A1c.  We talked about acute kidney injury and creatinine levels returning to normal.  The natural disease trajectory and expectations at EOL were discussed.  Advanced directives, concepts specific to code status, artifical feeding and hydration, and rehospitalization were considered and discussed.  We talk about CODE STATUS.  Tinnie Gens states that his mother had always stated she wanted to be resuscitated.  We talked about attempted resuscitation, this does not always work even in the hospital.  I share that as things start to look different for Mrs. Liptak he may be facing a different choice.  I encouraged him to continue CODE STATUS discussions with [facility].  Hospice and Palliative Care services outpatient were not discussed as she is active with [facility] program and they provide their own palliative services.  Discussed the importance of continued conversation with family and the medical providers regarding overall plan of care and treatment options, ensuring decisions are within the context of the patient's values and GOCs.  Questions and concerns were addressed.   The family was encouraged to  call with questions or concerns.  PMT will continue to support holistically.  Conference with attending, bedside nursing staff, transition of care team related to patient condition, needs, goals of care, disposition.   HCPOA  HCPOA -son, Pasty Manninen, is healthcare power of attorney    SUMMARY OF RECOMMENDATIONS   At this point continue to treat the treatable Encouraged to consider CODE STATUS Return home with [facility] program, active for approximately 4 years    Code Status/Advance Care Planning: Full code - We talk about CODE STATUS.  Tinnie Gens states that his mother had always stated she wanted to be resuscitated.  We talked about attempted resuscitation, this does not always work even in the hospital.  I share that as things start to look different  for Mrs. Earll he may be facing a different choice.  I encouraged him to continue CODE STATUS discussions with [facility]  Symptom Management:  Per hospitalist, no additional needs at this time.  Palliative Prophylaxis:  Frequent Pain Assessment and Oral Care  Additional Recommendations (Limitations, Scope, Preferences): Full Scope Treatment  Psycho-social/Spiritual:  Desire for further Chaplaincy support:no Additional Recommendations: Caregiving  Support/Resources  Prognosis:  Unable to determine, based on outcomes.  Guarded at this point.  Discharge Planning: Home with [facility], active for approximately 4 years      Primary Diagnoses: Present on Admission:  DKA, type 2 (HCC)  Hypothyroidism  Dyslipidemia  Essential hypertension  GERD  Diabetes mellitus with hyperglycemia (HCC)   I have reviewed the medical record, interviewed the patient and family, and examined the patient. The following aspects are pertinent.  Past Medical History:  Diagnosis Date   Acute ischemic stroke (HCC) 10/29/2015   Carpal tunnel syndrome    Dementia (HCC)    Diabetes mellitus without complication (HCC)    Dysphagia    GERD (gastroesophageal reflux disease)    Hiatal hernia    HLD (hyperlipidemia)    Hx of adenomatous colonic polyps 06/19/2014   Hyperlipidemia    Hypertension    Hypothyroidism    Onychomycosis 03/05/2012   Pain in lower limb 04/18/2013   Sciatic nerve pain    Stroke-like symptoms 10/28/2015   Vertigo    Social History   Socioeconomic History   Marital status: Single    Spouse name: Not on file   Number of children: 2   Years of education: Not on file   Highest education level: Not on file  Occupational History   Occupation: Retired  Tobacco Use   Smoking status: Never   Smokeless tobacco: Never  Vaping Use   Vaping status: Never Used  Substance and Sexual Activity   Alcohol use: No    Alcohol/week: 0.0 standard drinks of alcohol   Drug use: No    Sexual activity: Not on file  Other Topics Concern   Not on file  Social History Narrative   Single   2 sons   Retired   2 cups caffeine daily   05/01/2014   Social Determinants of Health   Financial Resource Strain: Low Risk  (06/28/2017)   Overall Financial Resource Strain (CARDIA)    Difficulty of Paying Living Expenses: Not hard at all  Food Insecurity: No Food Insecurity (06/28/2017)   Hunger Vital Sign    Worried About Running Out of Food in the Last Year: Never true    Ran Out of Food in the Last Year: Never true  Transportation Needs: No Transportation Needs (06/28/2017)   PRAPARE - Transportation    Lack  of Transportation (Medical): No    Lack of Transportation (Non-Medical): No  Physical Activity: Insufficiently Active (06/28/2017)   Exercise Vital Sign    Days of Exercise per Week: 2 days    Minutes of Exercise per Session: 20 min  Stress: Stress Concern Present (06/28/2017)   [employer] of Occupational Health - Occupational Stress Questionnaire    Feeling of Stress : Rather much  Social Connections: Somewhat Isolated (06/28/2017)   Social Connection and Isolation Panel [NHANES]    Frequency of Communication with Friends and Family: More than three times a week    Frequency of Social Gatherings with Friends and Family: More than three times a week    Attends Religious Services: More than 4 times per year    Active Member of [employer] or Organizations: No    Attends Engineer, structural: Never    Marital Status: Never married   Family History  Problem Relation Age of Onset   Alzheimer's disease Mother    Glaucoma Mother    Heart disease Father    Alzheimer's disease Father    Asthma Sister    Allergies Sister    Allergies Sister    Sleep apnea Sister    Heart disease Sister    Breast cancer Maternal Aunt    Pancreatic cancer Maternal Aunt    Prostate cancer Maternal Uncle    Prostate cancer Paternal Uncle    Alcoholism Maternal Uncle    Kidney  disease Maternal Aunt    Heart disease Maternal Aunt    Scheduled Meds:  amLODipine  5 mg Oral Daily   atorvastatin  80 mg Oral Daily   heparin  5,000 Units Subcutaneous Q8H   insulin aspart  0-15 Units Subcutaneous TID WC   insulin aspart  0-5 Units Subcutaneous QHS   insulin aspart  3 Units Subcutaneous TID WC   insulin NPH Human  10 Units Subcutaneous BID AC & HS   levothyroxine  125 mcg Oral QAC breakfast   mouth rinse  15 mL Mouth Rinse 4 times per day   Continuous Infusions:  0.45 % NaCl with KCl 20 mEq / L 100 mL/hr at 08/16/22 1200   insulin Stopped (08/16/22 1124)   PRN Meds:.acetaminophen, dextrose, OLANZapine zydis, ondansetron (ZOFRAN) IV, mouth rinse Medications Prior to Admission:  Prior to Admission medications   Medication Sig Start Date End Date Taking? Authorizing Provider  acetaminophen (TYLENOL) 500 MG tablet Take 500 mg by mouth daily as needed for mild pain.   Yes [provider]  calcium carbonate (TUMS EX) 750 MG chewable tablet Chew 2 tablets by mouth daily as needed (indigestion).   Yes [provider]  diclofenac Sodium (VOLTAREN ARTHRITIS PAIN) 1 % GEL Apply 2 g topically daily as needed (for pain).   Yes [provider]  glimepiride (AMARYL) 2 MG tablet Take 2 mg by mouth daily with breakfast.   Yes [provider]  hydrALAZINE (APRESOLINE) 25 MG tablet Take 25 mg by mouth daily.   Yes [provider]  meloxicam (MOBIC) 7.5 MG tablet Take 7.5 mg by mouth daily.   Yes [provider]  pantoprazole (PROTONIX) 20 MG tablet Take 1 tablet (20 mg total) by mouth daily. 01/10/22  Yes Bethann Berkshire, MD  amLODipine (NORVASC) 5 MG tablet TAKE ONE TABLET BY MOUTH DAILY Patient not taking: Reported on 08/16/2022 03/11/18   Sharon Seller, NP  amLODipine-benazepril (LOTREL) 5-10 MG capsule Take 1 capsule by mouth daily. Patient not taking: Reported  on 08/16/2022    [provider]  atorvastatin (LIPITOR)  40 MG tablet Take 2 tablets (80 mg total) by mouth daily. Patient not taking: Reported on 08/16/2022 01/08/21   Almon Hercules, MD  levothyroxine (SYNTHROID) 125 MCG tablet Take 125 mcg by mouth daily before breakfast. Patient not taking: Reported on 08/16/2022    [provider]  linagliptin (TRADJENTA) 5 MG TABS tablet Take 5 mg by mouth daily. Patient not taking: Reported on 08/16/2022    [provider]  rosuvastatin (CRESTOR) 5 MG tablet Take 5 mg by mouth daily. Patient not taking: Reported on 08/16/2022    [provider]  Semaglutide (RYBELSUS) 7 MG TABS Take 7 mg by mouth daily. Patient not taking: Reported on 08/16/2022    [provider]   Allergies  Allergen Reactions   Lantus [Insulin Glargine] Itching and Swelling    angioedema   Levemir [Insulin Detemir] Itching and Swelling    angioedema   Pineapple Anaphylaxis    Not listed on the [facility]   Codeine Other (See Comments)    Unknown reaction per [facility]   Invokana [Canagliflozin] Other (See Comments)    Constipation, nightmares Not listed on the [facility]   Penicillins Other (See Comments)    Unknown reaction per [facility]    Sulfa Antibiotics Other (See Comments)    Unknown reaction per [facility]   Tradjenta [Linagliptin] Other (See Comments)    Unknown   Vioxx [Rofecoxib] Other (See Comments)    Unknown reaction per MAR   Exelon [Rivastigmine] Rash   Review of Systems  Unable to perform ROS: Dementia    Physical Exam Vitals and nursing note reviewed.     Vital Signs: BP (!) 153/74 (BP Location: Left Arm)   Pulse (!) 106   Temp 98.8 F (37.1 C) (Oral)   Resp 18   Ht 5\' 4"  (1.626 m)   Wt 64.1 kg   SpO2 100%   BMI 24.26 kg/m  Pain Scale: PAINAD   Pain Score: 0-No pain   SpO2: SpO2: 100 % O2 Device:SpO2: 100 % O2 Flow Rate: .   IO: Intake/output summary:  Intake/Output Summary (Last 24 hours) at 08/16/2022 1413 Last data filed at 08/16/2022 1200 Gross per 24 hour  Intake 1910.83 ml  Output --   Net 1910.83 ml    LBM: Last BM Date :  (unk) Baseline Weight: Weight: 59 kg Most recent weight: Weight: 64.1 kg     Palliative Assessment/Data:     Time In: 1330 Time Out: 1445 Time Total: 75 minutes  Greater than 50%  of this time was spent counseling and coordinating care related to the above assessment and plan.  Signed by: Katheran Awe, NP   Please contact Palliative Medicine Team phone at [phone] for questions and concerns.  For individual provider: See Loretha Stapler

## 2022-08-16 NOTE — ED Notes (Signed)
ED TO INPATIENT HANDOFF REPORT  ED Nurse Name and Phone #: Kathryne Hitch [phone]  S Name/Age/Gender Allison [name] 88 y.o. female Room/Bed: 028C/028C  Code Status   Code Status: Full Code  Home/SNF/Other Skilled nursing facility Patient oriented to: self Is this baseline? Yes   Triage Complete: Triage complete  Chief Complaint DKA, type 2 (HCC) [E11.10]  Triage Note Patient arrives [facility] via EMS. Family saw the patient earlier and wanted her to be evaluated by the ED due to her not acting right. Family states that she is altered but the facility states that this is her baseline. Patient was given fluid in route ( NS).    Allergies Allergies  Allergen Reactions   Lantus [Insulin Glargine] Itching and Swelling    angioedema   Levemir [Insulin Detemir] Itching and Swelling    angioedema   Pineapple Anaphylaxis    Not listed on the [facility]   Codeine Other (See Comments)    Unknown reaction per [facility]   Invokana [Canagliflozin] Other (See Comments)    Constipation, nightmares Not listed on the [facility]   Penicillins Other (See Comments)    Unknown reaction per [facility]    Sulfa Antibiotics Other (See Comments)    Unknown reaction per [facility]   Tradjenta [Linagliptin] Other (See Comments)    Unknown   Vioxx [Rofecoxib] Other (See Comments)    Unknown reaction per [facility]   Exelon [Rivastigmine] Rash    Level of Care/Admitting Diagnosis ED Disposition     ED Disposition  Admit   Condition  --   Comment  Hospital Area: [facility] [[id number]]  Level of Care: Progressive [102]  Admit to Progressive based on following criteria: GI, ENDOCRINE disease patients with GI bleeding, acute liver failure or pancreatitis, stable with diabetic ketoacidosis or thyrotoxicosis (hypothyroid) state.  May place patient in observation at [facility] or [facility] if equivalent level of care is available:: No  Covid Evaluation: Asymptomatic - no recent exposure (last  10 days) testing not required  Diagnosis: DKA, type 2 [facility]) [[id number]]  Admitting Physician: Imogene Burn, ERIC [[id number]]  Attending Physician: Imogene Burn, ERIC [[id number]]          B Medical/Surgery History Past Medical History:  Diagnosis Date   Acute ischemic stroke (HCC) 10/29/2015   Carpal tunnel syndrome    Dementia (HCC)    Diabetes mellitus without complication (HCC)    Dysphagia    GERD (gastroesophageal reflux disease)    Hiatal hernia    HLD (hyperlipidemia)    Hx of adenomatous colonic polyps 06/19/2014   Hyperlipidemia    Hypertension    Hypothyroidism    Onychomycosis 03/05/2012   Pain in lower limb 04/18/2013   Sciatic nerve pain    Stroke-like symptoms 10/28/2015   Vertigo    Past Surgical History:  Procedure Laterality Date   BLADDER SURGERY     Mesh implant   COLONOSCOPY     LARYNX SURGERY     vocal cords   PARTIAL HYSTERECTOMY  1978   VESICOVAGINAL FISTULA CLOSURE W/ TAH  1976     A IV Location/Drains/Wounds Patient Lines/Drains/Airways Status     Active Line/Drains/Airways     Name Placement date Placement time Site Days   Peripheral IV 08/15/22 22 G Right Antecubital 08/15/22  2019  Antecubital  1   Peripheral IV 08/15/22 22 G Anterior;Right Hand 08/15/22  2236  Hand  1            Intake/Output Last  24 hours No intake or output data in the 24 hours ending 08/16/22 0039  Labs/Imaging Results for orders placed or performed during the hospital encounter of 08/15/22 (from the past 48 hour(s))  CBG monitoring, ED     Status: Abnormal   Collection Time: 08/15/22  8:28 PM  Result Value Ref Range   Glucose-Capillary 530 (HH) 70 - 99 mg/dL    Comment: Glucose reference range applies only to samples taken after fasting for at least 8 hours.   Comment 1 Notify RN   Basic metabolic panel     Status: Abnormal   Collection Time: 08/15/22  8:36 PM  Result Value Ref Range   Sodium 150 (H) 135 - 145 mmol/L   Potassium 4.0 3.5 - 5.1 mmol/L   Chloride 111 98 - 111  mmol/L   CO2 16 (L) 22 - 32 mmol/L   Glucose, Bld 589 (HH) 70 - 99 mg/dL    Comment: CRITICAL RESULT CALLED TO, READ BACK BY AND VERIFIED WITH WHEATLEY, N. RN @ 2120  08/15/22 JBUTLER Glucose reference range applies only to samples taken after fasting for at least 8 hours.    BUN 38 (H) 8 - 23 mg/dL   Creatinine, Ser 6.04 (H) 0.44 - 1.00 mg/dL   Calcium 54.0 (H) 8.9 - 10.3 mg/dL   GFR, Estimated 28 (L) >60 mL/min    Comment: (NOTE) Calculated using the CKD-EPI Creatinine Equation (2021)    Anion gap 23 (H) 5 - 15    Comment: ELECTROLYTES REPEATED TO VERIFY Performed at [facility], [address], [place], [place] [address]   CBC     Status: Abnormal   Collection Time: 08/15/22  8:36 PM  Result Value Ref Range   WBC 18.0 (H) 4.0 - 10.5 K/uL   RBC 5.17 (H) 3.87 - 5.11 MIL/uL   Hemoglobin 13.6 12.0 - 15.0 g/dL   HCT 14.7 82.9 - 56.2 %   MCV 83.2 80.0 - 100.0 fL   MCH 26.3 26.0 - 34.0 pg   MCHC 31.6 30.0 - 36.0 g/dL   RDW 13.0 86.5 - 78.4 %   Platelets 300 150 - 400 K/uL   nRBC 0.0 0.0 - 0.2 %    Comment: Performed at [facility], [address] [place], [place] [address]  Beta-hydroxybutyric acid     Status: Abnormal   Collection Time: 08/15/22  8:36 PM  Result Value Ref Range   Beta-Hydroxybutyric Acid >8.00 (H) 0.05 - 0.27 mmol/L    Comment: RESULT CONFIRMED BY MANUAL DILUTION Performed at [facility], [address], [place], [place] [address]   Urinalysis, Routine w reflex microscopic -Urine, Clean Catch     Status: Abnormal   Collection Time: 08/15/22  9:05 PM  Result Value Ref Range   Color, Urine STRAW (A) YELLOW   APPearance CLEAR CLEAR   Specific Gravity, Urine 1.022 1.005 - 1.030   pH 5.0 5.0 - 8.0   Glucose, UA >=500 (A) NEGATIVE mg/dL   Hgb urine dipstick MODERATE (A) NEGATIVE   Bilirubin Urine NEGATIVE NEGATIVE   Ketones, ur 80 (A) NEGATIVE mg/dL   Protein, ur 324 (A) NEGATIVE mg/dL   Nitrite NEGATIVE NEGATIVE   Leukocytes,Ua NEGATIVE  NEGATIVE   RBC / HPF 0-5 0 - 5 RBC/hpf   WBC, UA 0-5 0 - 5 WBC/hpf   Bacteria, UA NONE SEEN NONE SEEN   Squamous Epithelial / HPF 0-5 0 - 5 /HPF    Comment: Performed  at [facility], [address], [place], [place] [address]  I-Stat venous blood gas, [facility], [facility], [facility])     Status: Abnormal   Collection Time: 08/15/22 10:32 PM  Result Value Ref Range   pH, Ven 7.351 7.25 - 7.43   pCO2, Ven 33.2 (L) 44 - 60 mmHg   pO2, Ven 34 32 - 45 mmHg   Bicarbonate 18.4 (L) 20.0 - 28.0 mmol/L   TCO2 19 (L) 22 - 32 mmol/L   O2 Saturation 64 %   Acid-base deficit 6.0 (H) 0.0 - 2.0 mmol/L   Sodium 153 (H) 135 - 145 mmol/L   Potassium 4.9 3.5 - 5.1 mmol/L   Calcium, Ion 1.23 1.15 - 1.40 mmol/L   HCT 42.0 36.0 - 46.0 %   Hemoglobin 14.3 12.0 - 15.0 g/dL   Sample type VENOUS    Comment NOTIFIED PHYSICIAN   CBG monitoring, ED     Status: Abnormal   Collection Time: 08/15/22 10:40 PM  Result Value Ref Range   Glucose-Capillary 444 (H) 70 - 99 mg/dL    Comment: Glucose reference range applies only to samples taken after fasting for at least 8 hours.  CBG monitoring, ED     Status: Abnormal   Collection Time: 08/15/22 11:16 PM  Result Value Ref Range   Glucose-Capillary 408 (H) 70 - 99 mg/dL    Comment: Glucose reference range applies only to samples taken after fasting for at least 8 hours.  CBG monitoring, ED     Status: Abnormal   Collection Time: 08/15/22 11:51 PM  Result Value Ref Range   Glucose-Capillary 356 (H) 70 - 99 mg/dL    Comment: Glucose reference range applies only to samples taken after fasting for at least 8 hours.   DG Chest Portable 1 View  Result Date: 08/15/2022 CLINICAL DATA:  Altered mental status EXAM: PORTABLE CHEST 1 VIEW COMPARISON:  01/10/2022 FINDINGS: Lung volumes are small and there is bibasilar atelectasis. No pneumothorax or pleural effusion. Cardiac size within normal limits. Pulmonary vascularity is normal. No acute bone abnormality. IMPRESSION: 1. Pulmonary  hypoinflation. Electronically Signed   By: Helyn Numbers M.D.   On: 08/15/2022 21:17    Pending Labs Unresulted Labs (From admission, onward)     Start     Ordered   08/16/22 0016  Hemoglobin A1c  (Diabetes Ketoacidosis (DKA))  Once,   R       Comments: To assess prior glycemic control.    08/16/22 0016   08/16/22 0015  Basic metabolic panel  (Diabetes Ketoacidosis (DKA))  STAT Now then every 4 hours ,   R      08/16/22 0016   08/16/22 0015  Beta-hydroxybutyric acid  (Diabetes Ketoacidosis (DKA))  Now then every 8 hours,   R      08/16/22 0016            Vitals/Pain Today's Vitals   08/15/22 2236 08/15/22 2300 08/15/22 2330 08/16/22 0000  BP:  (!) 155/114 (!) 155/83 (!) 159/82  Pulse:  (!) 107 (!) 108 (!) 109  Resp:  17 (!) 21 14  Temp:      TempSrc:      SpO2:  97% 99% 99%  Weight: 59 kg     Height: 5\' 4"  (1.626 m)     PainSc:        Isolation Precautions No active isolations  Medications Medications  insulin regular, human (MYXREDLIN) 100 units/ 100 mL infusion (7 Units/hr Intravenous Rate/Dose  Change 08/15/22 2352)  lactated ringers infusion (has no administration in time range)  dextrose 5 % in lactated ringers infusion (has no administration in time range)  dextrose 50 % solution 0-50 mL (has no administration in time range)  potassium chloride 10 mEq in 100 mL IVPB (10 mEq Intravenous New Bag/Given 08/15/22 2357)  amLODipine (NORVASC) tablet 5 mg (has no administration in time range)  atorvastatin (LIPITOR) tablet 80 mg (has no administration in time range)  levothyroxine (SYNTHROID) tablet 125 mcg (has no administration in time range)  ondansetron (ZOFRAN) injection 4 mg (has no administration in time range)  acetaminophen (TYLENOL) tablet 650 mg (has no administration in time range)  OLANZapine zydis (ZYPREXA) disintegrating tablet 5 mg (has no administration in time range)  heparin injection 5,000 Units (has no administration in time range)  sodium chloride  0.9 % bolus 1,000 mL (0 mLs Intravenous Stopped 08/15/22 2352)  lactated ringers bolus 1,180 mL (1,180 mLs Intravenous New Bag/Given 08/15/22 2245)    Mobility manual wheelchair     Focused Assessments     R Recommendations: See Admitting Provider Note  Report given to:   Additional Notes:

## 2022-08-17 DIAGNOSIS — E870 Hyperosmolality and hypernatremia: Secondary | ICD-10-CM | POA: Diagnosis not present

## 2022-08-17 DIAGNOSIS — G308 Other Alzheimer's disease: Secondary | ICD-10-CM | POA: Diagnosis not present

## 2022-08-17 DIAGNOSIS — E1165 Type 2 diabetes mellitus with hyperglycemia: Secondary | ICD-10-CM | POA: Diagnosis not present

## 2022-08-17 DIAGNOSIS — Z515 Encounter for palliative care: Secondary | ICD-10-CM

## 2022-08-17 DIAGNOSIS — E1110 Type 2 diabetes mellitus with ketoacidosis without coma: Secondary | ICD-10-CM | POA: Diagnosis not present

## 2022-08-17 LAB — T4, FREE: Free T4: 0.91 ng/dL (ref 0.61–1.12)

## 2022-08-17 LAB — TSH: TSH: 7.686 u[IU]/mL — ABNORMAL HIGH (ref 0.350–4.500)

## 2022-08-17 LAB — GLUCOSE, CAPILLARY
Glucose-Capillary: 239 mg/dL — ABNORMAL HIGH (ref 70–99)
Glucose-Capillary: 340 mg/dL — ABNORMAL HIGH (ref 70–99)
Glucose-Capillary: 240 mg/dL — ABNORMAL HIGH (ref 70–99)
Glucose-Capillary: 143 mg/dL — ABNORMAL HIGH (ref 70–99)

## 2022-08-17 MED ORDER — INSULIN GLARGINE-YFGN 100 UNIT/ML ~~LOC~~ SOLN: 15.0000 [IU] | Freq: Every day | SUBCUTANEOUS | Status: DC

## 2022-08-17 MED ORDER — AMLODIPINE BESYLATE 10 MG PO TABS
10.0000 mg | ORAL_TABLET | Freq: Every day | ORAL | Status: DC
  Administered 2022-08-17 – 2022-08-18 (×2): 10 mg via ORAL
  Filled 2022-08-17 (×2): qty 1

## 2022-08-17 MED ORDER — INSULIN NPH (HUMAN) (ISOPHANE) 100 UNIT/ML ~~LOC~~ SUSP
10.0000 [IU] | Freq: Two times a day (BID) | SUBCUTANEOUS | Status: DC
  Administered 2022-08-17 – 2022-08-18 (×3): 10 [IU] via SUBCUTANEOUS
  Filled 2022-08-17: qty 10

## 2022-08-17 NOTE — Progress Notes (Signed)
Palliative:   Chart review completed.  Face-to-face discussion with transition of care team.  No needs identified.  Goals are set.  Conference with attending, bedside nursing staff, transition of care team related to patient condition, needs, goals of care, disposition.  Plan:  [facility] with [facility].  Anticipate discharge to home with her sister is primary caregiver within the next 1 to 2 days.   No charge Lillia Carmel, NP Palliative medicine team Team phone [phone] Greater than 50% of this time was spent counseling and coordinating care related to the above assessment and plan.

## 2022-08-17 NOTE — Inpatient Diabetes Management (Signed)
Inpatient Diabetes Program Recommendations  AACE/ADA: New Consensus Statement on Inpatient Glycemic Control (2015)  Target Ranges:  Prepandial:   less than 140 mg/dL      Peak postprandial:   less than 180 mg/dL (1-2 hours)      Critically ill patients:  140 - 180 mg/dL   Lab Results  Component Value Date   GLUCAP 340 (H) 08/17/2022   HGBA1C 11.5 (H) 08/16/2022    Review of Glycemic Control  Diabetes history: DM2 Outpatient Diabetes medications: Amaryl 2 mg every day, Tradjenta 5 (not taking), Rybelsus 7 (not taking)  Current orders for Inpatient glycemic control: Semglee 10 units every day, Novolog 3 units tid meal coverage, Novolog 0-15 units tid, 0-5 units hs  Inpatient Diabetes Program Recommendations:   Educated patient's sister Dennie Bible on insulin pen use at home. Reviewed contents of insulin flexpen starter kit. Reviewed all steps if insulin pen including attachment of needle, 2-unit air shot, dialing up dose, giving injection, removing needle, disposal of sharps, storage of unused insulin, disposal of insulin etc. Sister able to provide successful return demonstration. Also reviewed troubleshooting with insulin pen. MD to give patient Rxs for insulin pens and insulin pen needles.   Thank you, Billy Fischer. , RN, MSN, CDE  Diabetes Coordinator Inpatient Glycemic Control Team Team Pager [phone] (8am-5pm) 08/17/2022 3:40 PM

## 2022-08-17 NOTE — Progress Notes (Addendum)
PROGRESS NOTE  Allison [name] YQM:[id number] DOB: 01-24-1934   PCP[facility]  Patient is from: Nursing home (respite care). [facility]  DOA: 08/15/2022 LOS: 1  Chief complaints Chief Complaint  Patient presents with   Altered Mental Status   Hyperglycemia     Brief Narrative / Interim history: 88 year old F with PMH of severe dementia, DM-2, hypothyroidism, CVA, HTN, reflux and HLD brought to ED from nursing home due to altered mental status, and admitted for DKA, AKI and hypernatremia.  Started on insulin drip and IV fluid.  DKA resolved.  Transitioned to subcu insulin but very poor p.o. intake.  AKI, hypernatremia and encephalopathy improving.  TSH elevated likely from not taking Synthroid.  Remains on IV fluid   Subjective: Seen and examined earlier this morning.  No major events overnight of this morning.  No complaints but not a great historian.  She is awake and alert.  She is oriented to self and follows commands but not consistently.  Objective: Vitals:   08/16/22 2246 08/17/22 0608 08/17/22 0754 08/17/22 0920  BP: (!) 146/79 (!) 159/86 (!) 179/93 139/77  Pulse: 80 85 92 98  Resp: 20 20 20 18   Temp: 98.5 F (36.9 C) 98.6 F (37 C) 98 F (36.7 C)   TempSrc: Axillary Axillary Oral   SpO2: 98% 98% 100%   Weight:      Height:        Examination:  GENERAL: No apparent distress.  Nontoxic. HEENT: MMM.  Vision and hearing grossly intact.  NECK: Supple.  No apparent JVD.  RESP:  No IWOB.  Fair aeration bilaterally. CVS:  RRR. Heart sounds normal.  ABD/GI/GU: BS+. Abd soft, NTND.  MSK/EXT:  Moves extremities. No apparent deformity. No edema.  SKIN: no apparent skin lesion or wound NEURO: Awake and alert.  Oriented to self but not place.  Follows commands but not consistently.  No focal neurodeficit. PSYCH: Calm. Normal affect.   Procedures:  None  Microbiology summarized: None  Assessment and plan: Principal  Problem:   DKA, type 2 (HCC) Active Problems:   Family discord   Hypothyroidism   Dyslipidemia   Essential hypertension   GERD   Diabetes mellitus with hyperglycemia (HCC)   Alzheimer's dementia (HCC)   History of stroke   Hypernatremia   Diabetic ketoacidosis (HCC)  Uncontrolled NIDDM-2 with diabetic ketoacidosis: A1c 11.5%.  DKA resolved.  On Amaryl, Tradjenta and Rybelsus.  Multiple insulin allergies.  Has not received NPH or meal coverage due to poor p.o. intake. Recent Labs  Lab 08/16/22 1024 08/16/22 1130 08/16/22 1613 08/16/22 2119 08/17/22 0606  GLUCAP 173* 175* 114* 229* 239*  -Continue SSI-moderate -Continue NovoLog 3 units 3 times daily with meals -Decreased NPH 10 units twice daily and discontinued holding parameters. -Continue half-normal saline with KCl  -Monitor CBG -Liberating diet to regular given poor p.o. intake.  AKI: Baseline creatinine about 0.9.  Likely prerenal in the setting of poor p.o. intake and DKA.  AKI resolved. Recent Labs    11/09/21 0345 01/10/22 1510 06/23/22 0011 07/19/22 1750 08/15/22 2036 08/16/22 0218 08/16/22 0538 08/16/22 0943 08/16/22 1534 08/17/22 0212  BUN 13 8 16 10  38* 30* 26* 21 18 14   CREATININE 0.84 0.72 1.11* 0.91 1.76* 1.35* 1.12* 1.06* 1.02* 1.01*  -Continue monitoring -Continue IV fluid  Hypernatremia: Na 146 and corrects to 149.  Improving. -Continue half-normal saline -Monitor daily.  Acute metabolic encephalopathy/dementia without behavioral disturbance: Probably from dehydration  and DKA.  Also have severe dementia.  TSH elevated to 16 but better on repeat.  Free T4 within normal.  No focal neurodeficit.  Improved.  Awake and alert.  Oriented to self.  Follows commands but inconsistently.  Per family, ambulates well and very conversant at baseline although she has been agitated and confused lately.  -Treat treatable causes -Reorientation and delirium precaution -Avoid or minimize sedating  medications -Family support well, if available -PT/OT eval  Essential hypertension: BP slightly elevated. -Increase amlodipine to 10 mg daily. -Hold benazepril.  History of CVA/dyslipidemia -Continue lipitor.   Hypothyroidism: TSH elevated but free T4 normal. -Continue home Synthroid -Recheck TFT in 4 to 6 weeks  Hypokalemia/hypophosphatemia -Monitor replenish as appropriate   Goal of care counseling: [facility].  Remains full code. -Palliative medicine following.  Body mass index is 24.26 kg/m.           DVT prophylaxis:  heparin injection 5,000 Units Start: 08/16/22 0030 SCDs Start: 08/16/22 0016  Code Status: Full code Family Communication: None at bedside. Level of care: Med-Surg Status is: Inpatient The patient will remain inpatient because: Encephalopathy, DKA, hyponatremia and AKI   Final disposition: Patient's sister to take patient home once medically stable.  [facility] patient Consultants:  Palliative medicine  55 minutes with more than 50% spent in reviewing records, counseling patient/family and coordinating care.   Sch Meds:  Scheduled Meds:  amLODipine  10 mg Oral Daily   atorvastatin  80 mg Oral Daily   feeding supplement (GLUCERNA SHAKE)  237 mL Oral TID BM   heparin  5,000 Units Subcutaneous Q8H   insulin aspart  0-15 Units Subcutaneous TID WC   insulin aspart  0-5 Units Subcutaneous QHS   insulin aspart  3 Units Subcutaneous TID WC   insulin NPH Human  10 Units Subcutaneous BID AC & HS   levothyroxine  125 mcg Oral QAC breakfast   mouth rinse  15 mL Mouth Rinse 4 times per day   Continuous Infusions:  insulin Stopped (08/16/22 1124)   PRN Meds:.acetaminophen, dextrose, OLANZapine zydis, ondansetron (ZOFRAN) IV, mouth rinse  Antimicrobials: Anti-infectives (From admission, onward)    None        I have personally reviewed the following labs and images: CBC: Recent Labs  Lab 08/15/22 2036 08/15/22 2232  08/17/22 0212  WBC 18.0*  --  17.3*  HGB 13.6 14.3 12.2  HCT 43.0 42.0 37.9  MCV 83.2  --  81.2  PLT 300  --  225   BMP &GFR Recent Labs  Lab 08/16/22 0218 08/16/22 0538 08/16/22 0943 08/16/22 1534 08/17/22 0212  NA 156* 157* 157* 153* 146*  K 3.2* 3.2* 3.6 3.3* 3.7  CL 120* 120* 119* 119* 112*  CO2 22 24 25 24 22   GLUCOSE 234* 228* 180* 126* 238*  BUN 30* 26* 21 18 14   CREATININE 1.35* 1.12* 1.06* 1.02* 1.01*  CALCIUM 10.3 10.1 10.2 9.7 9.2  MG  --   --   --  2.0 1.8  PHOS  --   --   --  <1.0* 2.7   Estimated Creatinine Clearance: 35.2 mL/min (A) (by C-G formula based on SCr of 1.01 mg/dL (H)). Liver & Pancreas: Recent Labs  Lab 08/16/22 1534 08/17/22 0212  ALBUMIN 3.3* 3.1*   No results for input(s): "LIPASE", "AMYLASE" in the last 168 hours. No results for input(s): "AMMONIA" in the last 168 hours. Diabetic: Recent Labs    08/16/22 0218  HGBA1C  11.5*   Recent Labs  Lab 08/16/22 1024 08/16/22 1130 08/16/22 1613 08/16/22 2119 08/17/22 0606  GLUCAP 173* 175* 114* 229* 239*   Cardiac Enzymes: No results for input(s): "CKTOTAL", "CKMB", "CKMBINDEX", "TROPONINI" in the last 168 hours. No results for input(s): "PROBNP" in the last 8760 hours. Coagulation Profile: No results for input(s): "INR", "PROTIME" in the last 168 hours. Thyroid Function Tests: Recent Labs    08/17/22 0200  TSH 7.686*  FREET4 0.91   Lipid Profile: No results for input(s): "CHOL", "HDL", "LDLCALC", "TRIG", "CHOLHDL", "LDLDIRECT" in the last 72 hours. Anemia Panel: No results for input(s): "VITAMINB12", "FOLATE", "FERRITIN", "TIBC", "IRON", "RETICCTPCT" in the last 72 hours. Urine analysis:    Component Value Date/Time   COLORURINE STRAW (A) 08/15/2022 2105   APPEARANCEUR CLEAR 08/15/2022 2105   LABSPEC 1.022 08/15/2022 2105   PHURINE 5.0 08/15/2022 2105   GLUCOSEU >=500 (A) 08/15/2022 2105   HGBUR MODERATE (A) 08/15/2022 2105   BILIRUBINUR NEGATIVE 08/15/2022 2105    KETONESUR 80 (A) 08/15/2022 2105   PROTEINUR 100 (A) 08/15/2022 2105   UROBILINOGEN 0.2 03/29/2014 1055   NITRITE NEGATIVE 08/15/2022 2105   LEUKOCYTESUR NEGATIVE 08/15/2022 2105   Sepsis Labs: Invalid input(s): "PROCALCITONIN", "LACTICIDVEN"  Microbiology: Recent Results (from the past 240 hour(s))  MRSA Next Gen by PCR, Nasal     Status: None   Collection Time: 08/16/22  2:45 AM   Specimen: Nasal Mucosa; Nasal Swab  Result Value Ref Range Status   MRSA by PCR Next Gen NOT DETECTED NOT DETECTED Final    Comment: (NOTE) The GeneXpert MRSA Assay (FDA approved for NASAL specimens only), is one component of a comprehensive MRSA colonization surveillance program. It is not intended to diagnose MRSA infection nor to guide or monitor treatment for MRSA infections. Test performance is not FDA approved in patients less than 32 years old. Performed at [facility], [address], [place], [place] [address]     Radiology Studies: No results found.     T.  [facility]  If 7PM-7AM, please contact night-coverage [email or web address] 08/17/2022, 10:46 AM

## 2022-08-17 NOTE — Evaluation (Signed)
Physical Therapy Evaluation Patient Details Name: Allison [name] MRN: [id number] DOB: 12-Jul-1934 Today's Date: 08/17/2022  History of Present Illness  88 y.o. female presents to [facility] on 08/15/2022 with AMS, admitted for management of DMA, AKI. PMH includes dementia, DMII, hypothyroidism, CVA, HTN, GERD, HLD.  Clinical Impression  Pt presents to PT with deficits in functional mobility, gait, balance, strength, power, cognition. Pt demonstrates very poor initiation throughout session, requiring tactile cues and physical assistance to initiate most mobility. When ambulating pt requires significant assistance to direct walker. PT calls pt's son, Aayana Duckworth, who states the current plan is to discharge home with [facility] services. PT recommends the pt receive a RW to improve stability and reduce falls risk. HHPT services would be beneficial in an effort to improve strength, balance and endurance.        If plan is discharge home, recommend the following: A lot of help with walking and/or transfers;A lot of help with bathing/dressing/bathroom;Assistance with cooking/housework;Assistance with feeding;Assist for transportation;Help with stairs or ramp for entrance;Direct supervision/assist for medications management;Direct supervision/assist for financial management   Can travel by private vehicle        Equipment Recommendations Rolling walker (2 wheels)  Recommendations for Other Services       Functional Status Assessment Patient has had a recent decline in their functional status and demonstrates the ability to make significant improvements in function in a reasonable and predictable amount of time.     Precautions / Restrictions Precautions Precautions: Fall Precaution Comments: advanced dementia Restrictions Weight Bearing Restrictions: No      Mobility  Bed Mobility Overal bed mobility: Needs Assistance Bed Mobility: Sidelying to Sit, Sit to Supine   Sidelying to sit: Mod  assist   Sit to supine: Total assist   General bed mobility comments: poor initiation of mobility, pt does elevate trunk into sitting after PT assists with LEs    Transfers Overall transfer level: Needs assistance Equipment used: Rolling walker (2 wheels) Transfers: Sit to/from Stand Sit to Stand: Mod assist           General transfer comment: PT assists with initiation of transfer    Ambulation/Gait Ambulation/Gait assistance: Max assist Gait Distance (Feet): 20 Feet Assistive device: Rolling walker (2 wheels) Gait Pattern/deviations: Step-to pattern Gait velocity: reduced Gait velocity interpretation: <1.31 ft/sec, indicative of household ambulator   General Gait Details: pt requires maxA to direct walker in desired path  Stairs            Wheelchair Mobility     Tilt Bed    Modified Rankin (Stroke Patients Only)       Balance Overall balance assessment: Needs assistance Sitting-balance support: No upper extremity supported, Feet supported Sitting balance-Leahy Scale: Fair     Standing balance support: Bilateral upper extremity supported, Reliant on assistive device for balance Standing balance-Leahy Scale: Poor                               Pertinent Vitals/Pain Pain Assessment Pain Assessment: PAINAD Breathing: normal Negative Vocalization: none Facial Expression: smiling or inexpressive Body Language: relaxed Consolability: no need to console PAINAD Score: 0    Home Living Family/patient expects to be discharged to:: Private residence Living Arrangements: Other relatives;Children Available Help at Discharge: Family;Available 24 hours/day Type of Home: House Home Access: Level entry       Home Layout: One level Home Equipment: Cane - single point;BSC/3in1  Prior Function Prior Level of Function : Needs assist             Mobility Comments: ambulatory in the home with PRN use of cane until a few weeks ago prior  to contracting COVID ADLs Comments: assist for ADLs, totalA IADLs     Hand Dominance        Extremity/Trunk Assessment   Upper Extremity Assessment Upper Extremity Assessment: Generalized weakness;Difficult to assess due to impaired cognition    Lower Extremity Assessment Lower Extremity Assessment: Generalized weakness;Difficult to assess due to impaired cognition    Cervical / Trunk Assessment Cervical / Trunk Assessment: Kyphotic  Communication   Communication: Receptive difficulties;Expressive difficulties  Cognition Arousal/Alertness: Awake/alert Behavior During Therapy: Flat affect Overall Cognitive Status: History of cognitive impairments - at baseline Area of Impairment: Orientation, Attention, Memory, Following commands, Safety/judgement, Awareness, Problem solving                 Orientation Level: Disoriented to, Place, Time, Situation Current Attention Level: Focused Memory: Decreased recall of precautions, Decreased short-term memory Following Commands: Follows one step commands inconsistently Safety/Judgement: Decreased awareness of safety, Decreased awareness of deficits Awareness: Intellectual Problem Solving: Slow processing, Decreased initiation          General Comments General comments (skin integrity, edema, etc.): VSS on RA, pt incontinent of stool prior to PT arrival. Pt also persistently spitting out pieces of food during session, appears to have been pocketing food from breakfast possibly    Exercises     Assessment/Plan    PT Assessment Patient needs continued PT services  PT Problem List Decreased strength;Decreased activity tolerance;Decreased balance;Decreased mobility;Decreased cognition;Decreased knowledge of use of DME;Decreased safety awareness       PT Treatment Interventions DME instruction;Gait training;Functional mobility training;Therapeutic activities;Therapeutic exercise;Balance training;Neuromuscular  re-education;Cognitive remediation;Patient/family education;Wheelchair mobility training    PT Goals (Current goals can be found in the Care Plan section)  Acute Rehab PT Goals Patient Stated Goal: pt unable to state, PT goal to return to household ambulation PT Goal Formulation: Patient unable to participate in goal setting Time For Goal Achievement: 08/31/22 Potential to Achieve Goals: Fair    Frequency Min 1X/week     Co-evaluation               AM-PAC PT "6 Clicks" Mobility  Outcome Measure Help needed turning from your back to your side while in a flat bed without using bedrails?: A Lot Help needed moving from lying on your back to sitting on the side of a flat bed without using bedrails?: A Lot Help needed moving to and from a bed to a chair (including a wheelchair)?: A Lot Help needed standing up from a chair using your arms (e.g., wheelchair or bedside chair)?: A Lot Help needed to walk in hospital room?: A Lot Help needed climbing 3-5 steps with a railing? : Total 6 Click Score: 11    End of Session   Activity Tolerance: Other (comment) (limited by impaired cognition) Patient left: in bed;with call bell/phone within reach;with bed alarm set Nurse Communication: Mobility status PT Visit Diagnosis: Other abnormalities of gait and mobility (R26.89);Muscle weakness (generalized) (M62.81)    Time: 1130-1150 PT Time Calculation (min) (ACUTE ONLY): 20 min   Charges:   PT Evaluation $PT Eval Low Complexity: 1 Low   PT General Charges $$ ACUTE PT VISIT: 1 Visit         Arlyss Gandy, PT, DPT Acute Rehabilitation Office [phone]   Wyvonnia Dusky  Nedra Hai 08/17/2022, 12:25 PM

## 2022-08-18 DIAGNOSIS — E1110 Type 2 diabetes mellitus with ketoacidosis without coma: Secondary | ICD-10-CM | POA: Diagnosis not present

## 2022-08-18 DIAGNOSIS — E039 Hypothyroidism, unspecified: Secondary | ICD-10-CM | POA: Diagnosis not present

## 2022-08-18 DIAGNOSIS — Z8673 Personal history of transient ischemic attack (TIA), and cerebral infarction without residual deficits: Secondary | ICD-10-CM | POA: Diagnosis not present

## 2022-08-18 DIAGNOSIS — K219 Gastro-esophageal reflux disease without esophagitis: Secondary | ICD-10-CM

## 2022-08-18 DIAGNOSIS — G308 Other Alzheimer's disease: Secondary | ICD-10-CM | POA: Diagnosis not present

## 2022-08-18 LAB — GLUCOSE, CAPILLARY
Glucose-Capillary: 224 mg/dL — ABNORMAL HIGH (ref 70–99)
Glucose-Capillary: 231 mg/dL — ABNORMAL HIGH (ref 70–99)

## 2022-08-18 MED ORDER — ATORVASTATIN CALCIUM 40 MG PO TABS: 40.0000 mg | ORAL_TABLET | Freq: Every day | ORAL | 1 refills | Status: AC

## 2022-08-18 MED ORDER — BLOOD GLUCOSE TEST VI STRP: 1.0000 | ORAL_STRIP | Freq: Three times a day (TID) | 1 refills | Status: AC

## 2022-08-18 MED ORDER — ASPIRIN 81 MG PO TBEC: 81.0000 mg | DELAYED_RELEASE_TABLET | Freq: Every day | ORAL | 2 refills | Status: DC

## 2022-08-18 MED ORDER — LINAGLIPTIN 5 MG PO TABS: 5.0000 mg | ORAL_TABLET | Freq: Every day | ORAL | 1 refills | Status: DC

## 2022-08-18 MED ORDER — LEVOTHYROXINE SODIUM 125 MCG PO TABS: 125.0000 ug | ORAL_TABLET | Freq: Every day | ORAL | 1 refills | Status: DC

## 2022-08-18 MED ORDER — INSULIN ASPART PROT & ASPART (70-30 MIX) 100 UNIT/ML PEN: 15.0000 [IU] | PEN_INJECTOR | Freq: Two times a day (BID) | SUBCUTANEOUS | 11 refills | Status: DC

## 2022-08-18 MED ORDER — LANCET DEVICE MISC: 1.0000 | Freq: Three times a day (TID) | 1 refills | Status: AC

## 2022-08-18 MED ORDER — ASPIRIN 81 MG PO TBEC: 81.0000 mg | DELAYED_RELEASE_TABLET | Freq: Every day | ORAL | Status: AC

## 2022-08-18 MED ORDER — POTASSIUM CHLORIDE 20 MEQ PO PACK
60.0000 meq | PACK | Freq: Once | ORAL | Status: AC
  Administered 2022-08-18: 60 meq via ORAL
  Filled 2022-08-18: qty 3

## 2022-08-18 MED ORDER — AMLODIPINE BESYLATE 10 MG PO TABS: 10.0000 mg | ORAL_TABLET | Freq: Every day | ORAL | 1 refills | Status: DC

## 2022-08-18 MED ORDER — RYBELSUS 7 MG PO TABS: 7.0000 mg | ORAL_TABLET | Freq: Every day | ORAL | 2 refills | Status: DC

## 2022-08-18 MED ORDER — LANCET DEVICE MISC: 1.0000 | Freq: Three times a day (TID) | 1 refills | Status: DC

## 2022-08-18 MED ORDER — LEVOTHYROXINE SODIUM 125 MCG PO TABS: 125.0000 ug | ORAL_TABLET | Freq: Every day | ORAL | 1 refills | Status: AC

## 2022-08-18 MED ORDER — "PEN NEEDLES 3/16"" 31G X 5 MM MISC": 1.0000 | Freq: Two times a day (BID) | 1 refills | Status: DC

## 2022-08-18 MED ORDER — BLOOD GLUCOSE MONITORING SUPPL DEVI: 1.0000 | Freq: Three times a day (TID) | 0 refills | Status: DC

## 2022-08-18 MED ORDER — BLOOD GLUCOSE TEST VI STRP: 1.0000 | ORAL_STRIP | Freq: Three times a day (TID) | 1 refills | Status: DC

## 2022-08-18 MED ORDER — AMLODIPINE BESYLATE 10 MG PO TABS: 10.0000 mg | ORAL_TABLET | Freq: Every day | ORAL | 0 refills | Status: DC

## 2022-08-18 MED ORDER — ATORVASTATIN CALCIUM 40 MG PO TABS: 40.0000 mg | ORAL_TABLET | Freq: Every day | ORAL | 1 refills | Status: DC

## 2022-08-18 NOTE — TOC Transition Note (Signed)
Transition of Care (TOC) - CM/SW Discharge Note Donn Pierini RN, BSN Transitions of Care Unit 4E- RN Case Manager See Treatment Team for direct phone #   Patient Details  Name: Allison [name] MRN: [id number] Date of Birth: [birth year]  Transition [facility] CM/SW Contact:  Darrold Span, RN Phone Number: 08/18/2022, 12:38 PM   Clinical Narrative:    Pt stable for transition home today, CM has spoken with [facility]- SW- Marylene Land [phone]) and confirmed plan is for pt to return home with sister, son [name] has stated to Marylene Land that he will transport pt home.   Marylene Land is requesting that d/c summary, and any HH/DME needs be faxed to [facility]- orders have been placed per recs for HH/DME. [facility] will follow up to arrange needed services and DME- RW.  Faxed as requested tp [facility] atten: Dr. Renato Gails- to [phone]  Staff to contact son [name] when pt ready to be transported home, if son changes his mind about transport - CM can call [facility] to see about transport.   No further TOC needs at this time.    Final next level of care: Home w Home Health Services Barriers to Discharge: No Barriers Identified   Patient Goals and CMS Choice CMS Medicare.gov Compare Post Acute Care list provided to:: Patient Represenative (must comment) Choice offered to / list presented to : Adult Children (son [name])  Discharge Placement                         Discharge Plan and Services Additional resources added to the After Visit Summary for     Discharge Planning Services: CM Consult Post Acute Care Choice: NA (pt home w/ [facility])          DME Arranged: Walker rolling DME Agency:  ([facility] to arrange)       HH Arranged: RN, PT, OT ([facility] to follow up) HH Agency: Other - See comment ([facility]) Date HH Agency Contacted: 08/18/22 Time HH Agency Contacted: 1000 Representative spoke with at [facility] Agency: Marylene Land  Social Determinants of Health (SDOH) Interventions SDOH Screenings   Food Insecurity:  No Food Insecurity (06/28/2017)  Transportation Needs: No Transportation Needs (06/28/2017)  Alcohol Screen: Low Risk  (10/11/2017)  Financial Resource Strain: Low Risk  (06/28/2017)  Physical Activity: Insufficiently Active (06/28/2017)  Social Connections: Somewhat Isolated (06/28/2017)  Stress: Stress Concern Present (06/28/2017)  Tobacco Use: Low Risk  (08/16/2022)     Readmission Risk Interventions    08/18/2022   12:38 PM  Readmission Risk Prevention Plan  Transportation Screening Complete  PCP or Specialist Appt within 5-7 Days Complete  Home Care Screening Complete  Medication Review (RN CM) Complete

## 2022-08-18 NOTE — Discharge Summary (Signed)
Physician Discharge Summary  Allison [name] VOH:[id number] DOB: 86-11-1934 DOA: 08/15/2022  PCP: [facility]  Admit date: 08/15/2022 Discharge date: 08/18/2022 Admitted From: SNF Disposition: Home Recommendations for Outpatient Follow-up:  Follow up with PCP in 1 week Check glycemic control, CBC and CMP in 1 week Check thyroid panel in 4 to 6 weeks. Recommend palliative follow-up outpatient. Please follow up on the following pending results: None  Home Health: PT/OT/RN Equipment/Devices: Rolling walker  Discharge Condition: Stable CODE STATUS: Full code  Follow-up [facility], [address] [facility]. Schedule an appointment as soon as possible for a visit in 1 week(s).   Contact information: [address] [place] [address] [phone]                 Hospital course 88 year old F with PMH of severe dementia, DM-2, hypothyroidism, CVA, HTN, reflux and HLD brought to ED from nursing home due to altered mental status, and admitted for DKA, AKI and hypernatremia.  Started on insulin drip and IV fluid.  DKA, AKI and hypernatremia likely due to noncompliance with medications, and dehydration.  Per patient's sister, patient has been refusing to take her medications lately.  She was supposed to be on Tradjenta, Amaryl and Rybelsus.  A1c 11.5%.  TSH was also elevated likely from not taking Synthroid.    The next day, DKA resolved.  Transitioned to subcu insulin with NPH and NovoLog with good glycemic control.  She is discharged on home on 70/30 insulin 15 units twice daily.  History of anaphylaxis with Lantus or Levemir.  On the day of discharge, AKI, hypernatremia and encephalopathy resolved.  She is oriented to self and place at time of discharge.  Patient is discharged home with sister.  [facility] is [facility].  Therapy recommended home health PT/OT and DME.   See individual problem list below for more.    Problems addressed during this hospitalization Principal Problem:   DKA, type 2 (HCC) Active Problems:   Family discord   Hypothyroidism   Dyslipidemia   Essential hypertension   GERD   Diabetes mellitus with hyperglycemia (HCC)   Alzheimer's dementia (HCC)   History of stroke   Hypernatremia   Diabetic ketoacidosis (HCC)   Uncontrolled NIDDM-2 with diabetic ketoacidosis: A1c 11.5%.  DKA resolved.  On Amaryl, Tradjenta and Rybelsus.  Not compliant with meds.  CBG stable on 10 units of NPH twice daily and moderate sliding scale.  Discharged on 70/30 insulin 15 units twice daily before breakfast and dinner.  Continue Lipitor.  Reassess glycemic control and adjust as appropriate.   AKI: Baseline creatinine about 0.9.  Likely prerenal in the setting of poor p.o. intake and DKA.  AKI resolved. -Ensure good hydration. -Recheck in 1 week.   Hypernatremia: Resolved. Acute metabolic encephalopathy/dementia without behavioral disturbance: Probably from dehydration and DKA.  Also have severe dementia.  TSH elevated to 16 but better on repeat.  Free T4 within normal.  No focal neurodeficit.  Per family, ambulates well and very conversant at baseline although she has been agitated and confused lately.  Encephalopathy resolved.  She is awake and alert, oriented to self and place.  Follows commands. -Reorientation and delirium precautions.   Essential hypertension: BP slightly elevated. -Amlodipine 10 mg daily.   History of CVA/dyslipidemia -Continue lipitor. -Added low-dose aspirin.   Hypothyroidism: TSH elevated but free T4 normal. -Continue home Synthroid -Recheck TFT in 4 to 6  weeks   Hypokalemia/hypophosphatemia: Resolved.    Goal of care counseling: [facility].  Remains full code. -Appreciate input by palliative medicine. -Palliative follow-up outpatient.         Time spent 45 minutes  Vital signs Vitals:   08/17/22 1931 08/17/22 2321 08/18/22 0434 08/18/22  0828  BP: (!) 146/92 139/86 139/84 (!) 144/88  Pulse: 96 72 76 83  Temp: 98.4 F (36.9 C) 98 F (36.7 C) 97.8 F (36.6 C) 97.9 F (36.6 C)  Resp: 16 16 14 18   Height:      Weight:      SpO2: 96% 100% 97% 99%  TempSrc: Oral Oral Axillary Oral  BMI (Calculated):         Discharge exam  GENERAL: No apparent distress.  Nontoxic. HEENT: MMM.  Vision and hearing grossly intact.  NECK: Supple.  No apparent JVD.  RESP:  No IWOB.  Fair aeration bilaterally. CVS:  RRR. Heart sounds normal.  ABD/GI/GU: BS+. Abd soft, NTND.  MSK/EXT:  Moves extremities. No apparent deformity. No edema.  SKIN: no apparent skin lesion or wound NEURO: Awake and alert. Oriented to self and place.  Follows commands.  No apparent focal neuro deficit. PSYCH: Calm. Normal affect.   Discharge Instructions Discharge Instructions     Diet general   Complete by: As directed    Discharge instructions   Complete by: As directed    It has been a pleasure taking care of you!  You were hospitalized due to diabetic ketoacidosis, dehydration, acute kidney injury and very high sodium level likely from not taking your medications.  It is very important that you take your medications as prescribed.  Maintain good hydration.  Follow-up with your primary care doctor in 1 to 2 weeks or sooner if needed.   Take care,   Increase activity slowly   Complete by: As directed       Allergies as of 08/18/2022       Reactions   Lantus [insulin Glargine] Itching, Swelling   angioedema   Levemir [insulin Detemir] Itching, Swelling   angioedema   Pineapple Anaphylaxis   Not listed on the [facility]   Codeine Other (See Comments)   Unknown reaction per [facility]   Invokana [canagliflozin] Other (See Comments)   Constipation, nightmares Not listed on the [facility]   Penicillins Other (See Comments)   Unknown reaction per [facility]   Sulfa Antibiotics Other (See Comments)   Unknown reaction per [facility]   Tradjenta [linagliptin] Other (See Comments)    Unknown   Vioxx [rofecoxib] Other (See Comments)   Unknown reaction per [facility]   Exelon [rivastigmine] Rash        Medication List     STOP taking these medications    amLODipine-benazepril 5-10 MG capsule Commonly known as: LOTREL   glimepiride 2 MG tablet Commonly known as: AMARYL   hydrALAZINE 25 MG tablet Commonly known as: APRESOLINE   linagliptin 5 MG Tabs tablet Commonly known as: TRADJENTA   meloxicam 7.5 MG tablet Commonly known as: MOBIC   rosuvastatin 5 MG tablet Commonly known as: CRESTOR   Rybelsus 7 MG Tabs Generic drug: Semaglutide       TAKE these medications    acetaminophen 500 MG tablet Commonly known as: TYLENOL Take 500 mg by mouth daily as needed for mild pain.   amLODipine 10 MG tablet Commonly known as: NORVASC Take 1 tablet (10 mg total) by mouth daily. What changed:  medication strength how much to  take   aspirin EC 81 MG tablet Take 1 tablet (81 mg total) by mouth daily. Swallow whole.   atorvastatin 40 MG tablet Commonly known as: LIPITOR Take 1 tablet (40 mg total) by mouth daily. What changed: how much to take   Blood Glucose Monitoring Suppl Devi 1 each by Does not apply route in the morning, at noon, and at bedtime. May substitute to any manufacturer covered by patient's insurance.   BLOOD GLUCOSE TEST STRIPS Strp 1 each by In Vitro route 3 (three) times daily before meals. May substitute to any manufacturer covered by patient's insurance.   calcium carbonate 750 MG chewable tablet Commonly known as: TUMS EX Chew 2 tablets by mouth daily as needed (indigestion).   insulin aspart protamine - aspart (70-30) 100 UNIT/ML FlexPen Commonly known as: NOVOLOG 70/30 MIX Inject 15 Units into the skin 2 (two) times daily with a meal.   Lancet Device Misc 1 each by Does not apply route 3 (three) times daily before meals. May substitute to any manufacturer covered by patient's insurance.   levothyroxine 125 MCG  tablet Commonly known as: SYNTHROID Take 1 tablet (125 mcg total) by mouth daily before breakfast.   pantoprazole 20 MG tablet Commonly known as: PROTONIX Take 1 tablet (20 mg total) by mouth daily.   Pen Needles 3/16" 31G X 5 MM Misc 1 Pen by Does not apply route in the morning and at bedtime.   Voltaren Arthritis Pain 1 % Gel Generic drug: diclofenac Sodium Apply 2 g topically daily as needed (for pain).               Durable Medical Equipment  (From admission, onward)           Start     Ordered   08/18/22 1129  For home use only DME Walker rolling  Once       Question Answer Comment  Walker: With 5 Inch Wheels   Patient needs a walker to treat with the following condition Generalized weakness      08/18/22 1128            Consultations: Diabetic coordinator  Procedures/Studies:   DG Chest Portable 1 View  Result Date: 08/15/2022 CLINICAL DATA:  Altered mental status EXAM: PORTABLE CHEST 1 VIEW COMPARISON:  01/10/2022 FINDINGS: Lung volumes are small and there is bibasilar atelectasis. No pneumothorax or pleural effusion. Cardiac size within normal limits. Pulmonary vascularity is normal. No acute bone abnormality. IMPRESSION: 1. Pulmonary hypoinflation. Electronically Signed   By: Helyn Numbers M.D.   On: 08/15/2022 21:17       The results of significant diagnostics from this hospitalization (including imaging, microbiology, ancillary and laboratory) are listed below for reference.     Microbiology: Recent Results (from the past 240 hour(s))  MRSA Next Gen by PCR, Nasal     Status: None   Collection Time: 08/16/22  2:45 AM   Specimen: Nasal Mucosa; Nasal Swab  Result Value Ref Range Status   MRSA by PCR Next Gen NOT DETECTED NOT DETECTED Final    Comment: (NOTE) The GeneXpert MRSA Assay (FDA approved for NASAL specimens only), is one component of a comprehensive MRSA colonization surveillance program. It is not intended to diagnose MRSA  infection nor to guide or monitor treatment for MRSA infections. Test performance is not FDA approved in patients less than 13 years old. Performed at [facility], [address] [place], [place] [address]      Labs:  CBC: Recent Labs  Lab 08/15/22 2036 08/15/22 2232 08/17/22 0212 08/18/22 0059  WBC 18.0*  --  17.3* 11.2*  HGB 13.6 14.3 12.2 13.8  HCT 43.0 42.0 37.9 42.3  MCV 83.2  --  81.2 83.1  PLT 300  --  225 166   BMP &GFR Recent Labs  Lab 08/16/22 0538 08/16/22 0943 08/16/22 1534 08/17/22 0212 08/18/22 0059  NA 157* 157* 153* 146* 144  K 3.2* 3.6 3.3* 3.7 3.4*  CL 120* 119* 119* 112* 112*  CO2 24 25 24 22 23   GLUCOSE 228* 180* 126* 238* 250*  BUN 26* 21 18 14 15   CREATININE 1.12* 1.06* 1.02* 1.01* 0.81  CALCIUM 10.1 10.2 9.7 9.2 9.1  MG  --   --  2.0 1.8 1.9  PHOS  --   --  <1.0* 2.7 2.0*   Estimated Creatinine Clearance: 43.8 mL/min (by C-G formula based on SCr of 0.81 mg/dL). Liver & Pancreas: Recent Labs  Lab 08/16/22 1534 08/17/22 0212 08/18/22 0059  ALBUMIN 3.3* 3.1* 2.9*   No results for input(s): "LIPASE", "AMYLASE" in the last 168 hours. No results for input(s): "AMMONIA" in the last 168 hours. Diabetic: Recent Labs    08/16/22 0218  HGBA1C 11.5*   Recent Labs  Lab 08/17/22 0606 08/17/22 1148 08/17/22 1608 08/17/22 2105 08/18/22 0609  GLUCAP 239* 340* 240* 143* 224*   Cardiac Enzymes: No results for input(s): "CKTOTAL", "CKMB", "CKMBINDEX", "TROPONINI" in the last 168 hours. No results for input(s): "PROBNP" in the last 8760 hours. Coagulation Profile: No results for input(s): "INR", "PROTIME" in the last 168 hours. Thyroid Function Tests: Recent Labs    08/17/22 0200  TSH 7.686*  FREET4 0.91   Lipid Profile: No results for input(s): "CHOL", "HDL", "LDLCALC", "TRIG", "CHOLHDL", "LDLDIRECT" in the last 72 hours. Anemia Panel: No results for input(s): "VITAMINB12", "FOLATE", "FERRITIN", "TIBC", "IRON", "RETICCTPCT"  in the last 72 hours. Urine analysis:    Component Value Date/Time   COLORURINE STRAW (A) 08/15/2022 2105   APPEARANCEUR CLEAR 08/15/2022 2105   LABSPEC 1.022 08/15/2022 2105   PHURINE 5.0 08/15/2022 2105   GLUCOSEU >=500 (A) 08/15/2022 2105   HGBUR MODERATE (A) 08/15/2022 2105   BILIRUBINUR NEGATIVE 08/15/2022 2105   KETONESUR 80 (A) 08/15/2022 2105   PROTEINUR 100 (A) 08/15/2022 2105   UROBILINOGEN 0.2 03/29/2014 1055   NITRITE NEGATIVE 08/15/2022 2105   LEUKOCYTESUR NEGATIVE 08/15/2022 2105   Sepsis Labs: Invalid input(s): "PROCALCITONIN", "LACTICIDVEN"   SIGNED:  Almon Hercules, MD  [facility] 08/18/2022, 11:41 AM

## 2022-08-18 NOTE — Evaluation (Signed)
Occupational Therapy Evaluation Patient Details Name: Allison [name] MRN: [id number] DOB: [birth year] Today's Date: 08/18/2022   History of Present Illness 88 y.o. female presents to [facility] hospital on 08/15/2022 with AMS, admitted for management of DMA, AKI. PMH includes dementia, DMII, hypothyroidism, CVA, HTN, GERD, HLD.   Clinical Impression   Patient is a poor historian but reports that she lives at home with family and requires assist for ADLs. Patient required max verbal and tactile cues for patient to initiate wanted movement/task requiring max A for most functional transfers due to advanced dementia dx.  Patient's family wanting patient to return home with [facility] and patient would benefit from OT HHOT to help address UE strength deficits along with decreased overall safety to decrease burden of care on family.     Recommendations for follow up therapy are one component of a multi-disciplinary discharge planning process, led by the attending physician.  Recommendations may be updated based on patient status, additional functional criteria and insurance authorization.   Assistance Recommended at Discharge Frequent or constant Supervision/Assistance  Patient can return home with the following A lot of help with walking and/or transfers;Assistance with cooking/housework;Direct supervision/assist for medications management;Direct supervision/assist for financial management;Help with stairs or ramp for entrance;Assist for transportation;A lot of help with bathing/dressing/bathroom    Functional Status Assessment  Patient has had a recent decline in their functional status and demonstrates the ability to make significant improvements in function in a reasonable and predictable amount of time.  Equipment Recommendations  None recommended by OT    Recommendations for Other Services       Precautions / Restrictions Precautions Precautions: Fall Precaution Comments: advanced  dementia Restrictions Weight Bearing Restrictions: No      Mobility Bed Mobility Overal bed mobility: Needs Assistance Bed Mobility: Sidelying to Sit, Sit to Supine   Sidelying to sit: Max assist (tactile cues to initiate movement)   Sit to supine: Total assist        Transfers Overall transfer level: Needs assistance Equipment used: Rolling walker (2 wheels) Transfers: Sit to/from Stand Sit to Stand: Max assist (verbal cues for safe hand placement)                  Balance Overall balance assessment: Needs assistance Sitting-balance support: No upper extremity supported, Feet supported       Standing balance support: Bilateral upper extremity supported, Reliant on assistive device for balance                               ADL either performed or assessed with clinical judgement   ADL Overall ADL's : Needs assistance/impaired Eating/Feeding: Set up   Grooming: Wash/dry face;Wash/dry hands;Set up;Sitting   Upper Body Bathing: Moderate assistance;Sitting (verbal cues/tactile cues to initiate task)   Lower Body Bathing: Maximal assistance   Upper Body Dressing : Moderate assistance;Sitting (to don hospital gown)   Lower Body Dressing: Total assistance                       Vision Baseline Vision/History: 1 Wears glasses (per patient report)       Perception     Praxis      Pertinent Vitals/Pain Pain Assessment Pain Assessment: No/denies pain Breathing: normal Negative Vocalization: none Facial Expression: smiling or inexpressive Body Language: relaxed Consolability: no need to console PAINAD Score: 0     Hand Dominance Right   Extremity/Trunk Assessment Upper  Extremity Assessment Upper Extremity Assessment: Generalized weakness   Lower Extremity Assessment Lower Extremity Assessment: Defer to PT evaluation   Cervical / Trunk Assessment Cervical / Trunk Assessment: Kyphotic   Communication  Communication Communication: Receptive difficulties;Expressive difficulties   Cognition Arousal/Alertness: Awake/alert Behavior During Therapy: Flat affect Overall Cognitive Status: History of cognitive impairments - at baseline Area of Impairment: Orientation, Attention, Memory, Following commands, Safety/judgement, Awareness, Problem solving                 Orientation Level: Disoriented to, Place, Time, Situation Current Attention Level: Focused Memory: Decreased recall of precautions, Decreased short-term memory Following Commands: Follows one step commands inconsistently Safety/Judgement: Decreased awareness of safety, Decreased awareness of deficits Awareness: Intellectual Problem Solving: Slow processing, Decreased initiation       General Comments       Exercises     Shoulder Instructions      Home Living Family/patient expects to be discharged to:: Private residence Living Arrangements: Other relatives;Children Available Help at Discharge: Family;Available 24 hours/day Type of Home: House Home Access: Level entry     Home Layout: One level     Bathroom Shower/Tub: Tub/shower unit         Home Equipment: Cane - single point;BSC/3in1          Prior Functioning/Environment Prior Level of Function : Needs assist  Cognitive Assist : Mobility (cognitive);ADLs (cognitive) Mobility (Cognitive): Intermittent cues ADLs (Cognitive): Step by step cues Physical Assist : ADLs (physical)   ADLs (physical): Grooming;Bathing;Toileting;Dressing Mobility Comments: ambulatory in the home with PRN use of cane until a few weeks ago prior to contracting COVID ADLs Comments: assist for ADLs, totalA IADLs        OT Problem List: Decreased strength;Decreased activity tolerance;Decreased safety awareness      OT Treatment/Interventions: Self-care/ADL training;Therapeutic exercise;Therapeutic activities;Patient/family education    OT Goals(Current goals can be  found in the care plan section) Acute Rehab OT Goals OT Goal Formulation: With patient Time For Goal Achievement: 09/01/22 Potential to Achieve Goals: Fair  OT Frequency: Min 1X/week    Co-evaluation              AM-PAC OT "6 Clicks" Daily Activity     Outcome Measure Help from another person eating meals?: A Little Help from another person taking care of personal grooming?: A Little Help from another person toileting, which includes using toliet, bedpan, or urinal?: A Lot Help from another person bathing (including washing, rinsing, drying)?: A Lot Help from another person to put on and taking off regular upper body clothing?: A Lot Help from another person to put on and taking off regular lower body clothing?: Total 6 Click Score: 13   End of Session Equipment Utilized During Treatment: Gait belt;Rolling walker (2 wheels) Nurse Communication: Mobility status  Activity Tolerance: Patient tolerated treatment well Patient left: in bed;with bed alarm set;with call bell/phone within reach  OT Visit Diagnosis: Unsteadiness on feet (R26.81);Muscle weakness (generalized) (M62.81)                Time: 1610-9604 OT Time Calculation (min): 14 min Charges:    Governor Specking OT/L Denice Paradise 08/18/2022, 3:01 PM

## 2022-08-24 ENCOUNTER — Other Ambulatory Visit: Payer: Self-pay

## 2022-08-24 ENCOUNTER — Inpatient Hospital Stay (HOSPITAL_COMMUNITY)
Admission: EM | Admit: 2022-08-24 | Discharge: 2022-08-31 | DRG: 871 | Disposition: A | Discharge status: Skilled Nursing Facility | Payer: Medicare (Managed Care) | Attending: Family Medicine | Admitting: Family Medicine

## 2022-08-24 ENCOUNTER — Emergency Department (HOSPITAL_COMMUNITY): Payer: Medicare (Managed Care)

## 2022-08-24 DIAGNOSIS — L89152 Pressure ulcer of sacral region, stage 2: Secondary | ICD-10-CM | POA: Diagnosis present

## 2022-08-24 DIAGNOSIS — F0280 Dementia in other diseases classified elsewhere without behavioral disturbance: Secondary | ICD-10-CM | POA: Diagnosis not present

## 2022-08-24 DIAGNOSIS — Z79899 Other long term (current) drug therapy: Secondary | ICD-10-CM

## 2022-08-24 DIAGNOSIS — R627 Adult failure to thrive: Secondary | ICD-10-CM | POA: Diagnosis not present

## 2022-08-24 DIAGNOSIS — Z6824 Body mass index (BMI) 24.0-24.9, adult: Secondary | ICD-10-CM

## 2022-08-24 DIAGNOSIS — E1165 Type 2 diabetes mellitus with hyperglycemia: Secondary | ICD-10-CM | POA: Diagnosis present

## 2022-08-24 DIAGNOSIS — R6520 Severe sepsis without septic shock: Secondary | ICD-10-CM | POA: Diagnosis present

## 2022-08-24 DIAGNOSIS — Z7189 Other specified counseling: Secondary | ICD-10-CM | POA: Diagnosis not present

## 2022-08-24 DIAGNOSIS — Z90711 Acquired absence of uterus with remaining cervical stump: Secondary | ICD-10-CM

## 2022-08-24 DIAGNOSIS — E785 Hyperlipidemia, unspecified: Secondary | ICD-10-CM | POA: Diagnosis present

## 2022-08-24 DIAGNOSIS — E440 Moderate protein-calorie malnutrition: Secondary | ICD-10-CM | POA: Diagnosis present

## 2022-08-24 DIAGNOSIS — Z820 Family history of epilepsy and other diseases of the nervous system: Secondary | ICD-10-CM

## 2022-08-24 DIAGNOSIS — Z794 Long term (current) use of insulin: Secondary | ICD-10-CM | POA: Diagnosis not present

## 2022-08-24 DIAGNOSIS — G9341 Metabolic encephalopathy: Secondary | ICD-10-CM

## 2022-08-24 DIAGNOSIS — E876 Hypokalemia: Secondary | ICD-10-CM | POA: Diagnosis present

## 2022-08-24 DIAGNOSIS — E039 Hypothyroidism, unspecified: Secondary | ICD-10-CM | POA: Diagnosis not present

## 2022-08-24 DIAGNOSIS — G9340 Encephalopathy, unspecified: Secondary | ICD-10-CM

## 2022-08-24 DIAGNOSIS — E870 Hyperosmolality and hypernatremia: Secondary | ICD-10-CM | POA: Diagnosis present

## 2022-08-24 DIAGNOSIS — Z79890 Hormone replacement therapy (postmenopausal): Secondary | ICD-10-CM

## 2022-08-24 DIAGNOSIS — G309 Alzheimer's disease, unspecified: Secondary | ICD-10-CM | POA: Diagnosis present

## 2022-08-24 DIAGNOSIS — Z8616 Personal history of COVID-19: Secondary | ICD-10-CM

## 2022-08-24 DIAGNOSIS — J189 Pneumonia, unspecified organism: Secondary | ICD-10-CM | POA: Diagnosis present

## 2022-08-24 DIAGNOSIS — Z880 Allergy status to penicillin: Secondary | ICD-10-CM

## 2022-08-24 DIAGNOSIS — E860 Dehydration: Secondary | ICD-10-CM | POA: Diagnosis present

## 2022-08-24 DIAGNOSIS — Z882 Allergy status to sulfonamides status: Secondary | ICD-10-CM

## 2022-08-24 DIAGNOSIS — Z7982 Long term (current) use of aspirin: Secondary | ICD-10-CM | POA: Diagnosis not present

## 2022-08-24 DIAGNOSIS — Z91018 Allergy to other foods: Secondary | ICD-10-CM

## 2022-08-24 DIAGNOSIS — K219 Gastro-esophageal reflux disease without esophagitis: Secondary | ICD-10-CM | POA: Diagnosis not present

## 2022-08-24 DIAGNOSIS — Z8673 Personal history of transient ischemic attack (TIA), and cerebral infarction without residual deficits: Secondary | ICD-10-CM

## 2022-08-24 DIAGNOSIS — N179 Acute kidney failure, unspecified: Secondary | ICD-10-CM | POA: Diagnosis not present

## 2022-08-24 DIAGNOSIS — Z8249 Family history of ischemic heart disease and other diseases of the circulatory system: Secondary | ICD-10-CM

## 2022-08-24 DIAGNOSIS — A419 Sepsis, unspecified organism: Principal | ICD-10-CM | POA: Diagnosis present

## 2022-08-24 DIAGNOSIS — Z515 Encounter for palliative care: Secondary | ICD-10-CM | POA: Diagnosis not present

## 2022-08-24 DIAGNOSIS — I10 Essential (primary) hypertension: Secondary | ICD-10-CM | POA: Diagnosis not present

## 2022-08-24 DIAGNOSIS — N390 Urinary tract infection, site not specified: Secondary | ICD-10-CM | POA: Diagnosis present

## 2022-08-24 DIAGNOSIS — Z1152 Encounter for screening for COVID-19: Secondary | ICD-10-CM

## 2022-08-24 DIAGNOSIS — Z888 Allergy status to other drugs, medicaments and biological substances status: Secondary | ICD-10-CM

## 2022-08-24 DIAGNOSIS — G308 Other Alzheimer's disease: Secondary | ICD-10-CM | POA: Diagnosis not present

## 2022-08-24 HISTORY — DX: Metabolic encephalopathy: G93.41

## 2022-08-24 HISTORY — DX: Sepsis, unspecified organism: A41.9

## 2022-08-24 LAB — CBC WITH DIFFERENTIAL/PLATELET
Abs Immature Granulocytes: 0 10*3/uL (ref 0.00–0.07)
Basophils Absolute: 0 10*3/uL (ref 0.0–0.1)
Basophils Relative: 0 %
Eosinophils Absolute: 0 10*3/uL (ref 0.0–0.5)
Eosinophils Relative: 0 %
HCT: 46.9 % — ABNORMAL HIGH (ref 36.0–46.0)
Hemoglobin: 15.1 g/dL — ABNORMAL HIGH (ref 12.0–15.0)
Lymphocytes Relative: 6 %
Lymphs Abs: 1.4 10*3/uL (ref 0.7–4.0)
MCH: 27.5 pg (ref 26.0–34.0)
MCHC: 32.2 g/dL (ref 30.0–36.0)
MCV: 85.3 fL (ref 80.0–100.0)
Monocytes Absolute: 1.8 10*3/uL — ABNORMAL HIGH (ref 0.1–1.0)
Monocytes Relative: 8 %
Neutro Abs: 19.7 10*3/uL — ABNORMAL HIGH (ref 1.7–7.7)
Neutrophils Relative %: 86 %
Platelets: ADEQUATE 10*3/uL (ref 150–400)
RBC: 5.5 MIL/uL — ABNORMAL HIGH (ref 3.87–5.11)
RDW: 16.4 % — ABNORMAL HIGH (ref 11.5–15.5)
Smear Review: NORMAL
WBC: 22.9 10*3/uL — ABNORMAL HIGH (ref 4.0–10.5)
nRBC: 0 % (ref 0.0–0.2)

## 2022-08-24 LAB — I-STAT VENOUS BLOOD GAS, ED
Acid-Base Excess: 0 mmol/L (ref 0.0–2.0)
Bicarbonate: 21.8 mmol/L (ref 20.0–28.0)
Calcium, Ion: 1.08 mmol/L — ABNORMAL LOW (ref 1.15–1.40)
HCT: 44 % (ref 36.0–46.0)
Hemoglobin: 15 g/dL (ref 12.0–15.0)
O2 Saturation: 93 %
Potassium: 5.4 mmol/L — ABNORMAL HIGH (ref 3.5–5.1)
Sodium: 155 mmol/L — ABNORMAL HIGH (ref 135–145)
TCO2: 23 mmol/L (ref 22–32)
pCO2, Ven: 28.5 mmHg — ABNORMAL LOW (ref 44–60)
pH, Ven: 7.493 — ABNORMAL HIGH (ref 7.25–7.43)
pO2, Ven: 60 mmHg — ABNORMAL HIGH (ref 32–45)

## 2022-08-24 LAB — URINALYSIS, W/ REFLEX TO CULTURE (INFECTION SUSPECTED)
Bilirubin Urine: NEGATIVE
Glucose, UA: NEGATIVE mg/dL
Ketones, ur: 5 mg/dL — AB
Nitrite: NEGATIVE
Protein, ur: 30 mg/dL — AB
RBC / HPF: >50 RBC/hpf (ref 0–5)
Specific Gravity, Urine: 1.014 (ref 1.005–1.030)
pH: 5 (ref 5.0–8.0)

## 2022-08-24 LAB — COMPREHENSIVE METABOLIC PANEL
ALT: 19 U/L (ref 0–44)
AST: 32 U/L (ref 15–41)
Albumin: 3 g/dL — ABNORMAL LOW (ref 3.5–5.0)
Alkaline Phosphatase: 73 U/L (ref 38–126)
Anion gap: 17 — ABNORMAL HIGH (ref 5–15)
BUN: 68 mg/dL — ABNORMAL HIGH (ref 8–23)
CO2: 18 mmol/L — ABNORMAL LOW (ref 22–32)
Calcium: 9.7 mg/dL (ref 8.9–10.3)
Chloride: 121 mmol/L — ABNORMAL HIGH (ref 98–111)
Creatinine, Ser: 2.08 mg/dL — ABNORMAL HIGH (ref 0.44–1.00)
GFR, Estimated: 23 mL/min — ABNORMAL LOW (ref >60)
Glucose, Bld: 134 mg/dL — ABNORMAL HIGH (ref 70–99)
Potassium: 4.1 mmol/L (ref 3.5–5.1)
Sodium: 156 mmol/L — ABNORMAL HIGH (ref 135–145)
Total Bilirubin: 1.9 mg/dL — ABNORMAL HIGH (ref 0.3–1.2)
Total Protein: 6.6 g/dL (ref 6.5–8.1)

## 2022-08-24 LAB — GLUCOSE, CAPILLARY
Glucose-Capillary: 161 mg/dL — ABNORMAL HIGH (ref 70–99)
Glucose-Capillary: 225 mg/dL — ABNORMAL HIGH (ref 70–99)

## 2022-08-24 LAB — CBG MONITORING, ED: Glucose-Capillary: 120 mg/dL — ABNORMAL HIGH (ref 70–99)

## 2022-08-24 LAB — LACTIC ACID, PLASMA
Lactic Acid, Venous: 2.5 mmol/L (ref 0.5–1.9)
Lactic Acid, Venous: 2.6 mmol/L (ref 0.5–1.9)

## 2022-08-24 MED ORDER — AMLODIPINE BESYLATE 10 MG PO TABS
10.0000 mg | ORAL_TABLET | Freq: Every day | ORAL | Status: DC
  Administered 2022-08-27 – 2022-08-31 (×5): 10 mg via ORAL
  Filled 2022-08-24 (×7): qty 1

## 2022-08-24 MED ORDER — ENOXAPARIN SODIUM 40 MG/0.4ML IJ SOSY
40.0000 mg | PREFILLED_SYRINGE | INTRAMUSCULAR | Status: DC
  Administered 2022-08-24: 40 mg via SUBCUTANEOUS
  Filled 2022-08-24: qty 0.4

## 2022-08-24 MED ORDER — INSULIN ASPART 100 UNIT/ML IJ SOLN
0.0000 [IU] | Freq: Three times a day (TID) | INTRAMUSCULAR | Status: DC
  Administered 2022-08-25: 3 [IU] via SUBCUTANEOUS
  Administered 2022-08-25: 4 [IU] via SUBCUTANEOUS

## 2022-08-24 MED ORDER — ACETAMINOPHEN 325 MG PO TABS: 650.0000 mg | ORAL_TABLET | Freq: Four times a day (QID) | ORAL | Status: DC | PRN

## 2022-08-24 MED ORDER — ACETAMINOPHEN 650 MG RE SUPP: 650.0000 mg | Freq: Four times a day (QID) | RECTAL | Status: DC | PRN

## 2022-08-24 MED ORDER — SODIUM CHLORIDE 0.9% FLUSH
3.0000 mL | Freq: Two times a day (BID) | INTRAVENOUS | Status: DC
  Administered 2022-08-24 – 2022-08-30 (×9): 3 mL via INTRAVENOUS

## 2022-08-24 MED ORDER — INSULIN ASPART PROT & ASPART (70-30 MIX) 100 UNIT/ML ~~LOC~~ SUSP
15.0000 [IU] | Freq: Two times a day (BID) | SUBCUTANEOUS | Status: DC
  Filled 2022-08-24: qty 10

## 2022-08-24 MED ORDER — SODIUM CHLORIDE 0.45 % IV SOLN: INTRAVENOUS | Status: DC

## 2022-08-24 MED ORDER — DICLOFENAC SODIUM 1 % EX GEL
2.0000 g | Freq: Every day | CUTANEOUS | Status: DC | PRN
  Filled 2022-08-24 (×2): qty 100

## 2022-08-24 MED ORDER — PANTOPRAZOLE SODIUM 20 MG PO TBEC
20.0000 mg | DELAYED_RELEASE_TABLET | Freq: Every day | ORAL | Status: DC
  Administered 2022-08-25 – 2022-08-31 (×5): 20 mg via ORAL
  Filled 2022-08-24 (×6): qty 1

## 2022-08-24 MED ORDER — SODIUM CHLORIDE 0.9 % IV SOLN
1.0000 g | Freq: Once | INTRAVENOUS | Status: AC
  Administered 2022-08-24: 1 g via INTRAVENOUS
  Filled 2022-08-24: qty 10

## 2022-08-24 MED ORDER — LEVOTHYROXINE SODIUM 25 MCG PO TABS
125.0000 ug | ORAL_TABLET | Freq: Every day | ORAL | Status: DC
  Administered 2022-08-27 – 2022-08-30 (×3): 125 ug via ORAL
  Filled 2022-08-24 (×6): qty 1

## 2022-08-24 MED ORDER — SODIUM CHLORIDE 0.9 % IV SOLN
1.0000 g | INTRAVENOUS | Status: DC
  Administered 2022-08-25: 1 g via INTRAVENOUS
  Filled 2022-08-24: qty 10

## 2022-08-24 MED ORDER — ALBUTEROL SULFATE (2.5 MG/3ML) 0.083% IN NEBU: 2.5000 mg | INHALATION_SOLUTION | Freq: Four times a day (QID) | RESPIRATORY_TRACT | Status: DC | PRN

## 2022-08-24 MED ORDER — ATORVASTATIN CALCIUM 40 MG PO TABS
40.0000 mg | ORAL_TABLET | Freq: Every day | ORAL | Status: DC
  Administered 2022-08-25 – 2022-08-31 (×6): 40 mg via ORAL
  Filled 2022-08-24 (×7): qty 1

## 2022-08-24 MED ORDER — LACTATED RINGERS IV BOLUS
1000.0000 mL | Freq: Once | INTRAVENOUS | Status: AC
  Administered 2022-08-24: 1000 mL via INTRAVENOUS

## 2022-08-24 MED ORDER — ENOXAPARIN SODIUM 30 MG/0.3ML IJ SOSY
30.0000 mg | PREFILLED_SYRINGE | INTRAMUSCULAR | Status: DC
  Administered 2022-08-25 – 2022-08-28 (×4): 30 mg via SUBCUTANEOUS
  Filled 2022-08-24 (×4): qty 0.3

## 2022-08-24 MED ORDER — ASPIRIN 81 MG PO TBEC
81.0000 mg | DELAYED_RELEASE_TABLET | Freq: Every day | ORAL | Status: DC
  Administered 2022-08-27 – 2022-08-31 (×4): 81 mg via ORAL
  Filled 2022-08-24 (×7): qty 1

## 2022-08-24 MED ORDER — INSULIN ASPART PROT & ASPART (70-30 MIX) 100 UNIT/ML ~~LOC~~ SUSP
10.0000 [IU] | Freq: Two times a day (BID) | SUBCUTANEOUS | Status: DC
  Administered 2022-08-25: 10 [IU] via SUBCUTANEOUS
  Filled 2022-08-24: qty 10

## 2022-08-24 NOTE — ED Provider Notes (Signed)
[place] EMERGENCY DEPARTMENT AT [facility] Provider Note   CSN: [id number] Arrival date & time: 08/24/22  7829     History  Chief Complaint  Patient presents with   Altered Mental Status    Allison [name] is a 88 y.o. female.   Altered Mental Status Patient brought in for home.  Reportedly decreased mental status.  Reviewed note from recent discharge.  History of dementia that has been worsening.  Had DKA and acute kidney injury last time due to noncompliance and decreased oral intake.  Reportedly has not been eating at home.  However patient cannot provide much history.  Per EMS family is considering that she may need to go to facility.     Home Medications Prior to Admission medications   Medication Sig Start Date End Date Taking? Authorizing Provider  acetaminophen (TYLENOL) 500 MG tablet Take 500 mg by mouth daily as needed for mild pain.    [provider]  amLODipine (NORVASC) 10 MG tablet Take 1 tablet (10 mg total) by mouth daily. 08/18/22   Almon Hercules, MD  aspirin EC 81 MG tablet Take 1 tablet (81 mg total) by mouth daily. Swallow whole. 08/18/22 08/18/23  Almon Hercules, MD  atorvastatin (LIPITOR) 40 MG tablet Take 1 tablet (40 mg total) by mouth daily. 08/18/22   Almon Hercules, MD  Blood Glucose Monitoring Suppl DEVI 1 each by Does not apply route in the morning, at noon, and at bedtime. May substitute to any manufacturer covered by patient's insurance. 08/18/22   Almon Hercules, MD  calcium carbonate (TUMS EX) 750 MG chewable tablet Chew 2 tablets by mouth daily as needed (indigestion).    [provider]  diclofenac Sodium (VOLTAREN ARTHRITIS PAIN) 1 % GEL Apply 2 g topically daily as needed (for pain).    [provider]  Glucose Blood (BLOOD GLUCOSE TEST STRIPS) STRP 1 each by In Vitro route 3 (three) times daily before meals. May substitute to any manufacturer covered by patient's insurance. 08/18/22 10/24/22  Almon Hercules, MD   insulin aspart protamine - aspart (NOVOLOG 70/30 MIX) (70-30) 100 UNIT/ML FlexPen Inject 15 Units into the skin 2 (two) times daily with a meal. 08/18/22   Almon Hercules, MD  Insulin Pen Needle (PEN NEEDLES 3/16") 31G X 5 MM MISC 1 Pen by Does not apply route in the morning and at bedtime. 08/18/22   Almon Hercules, MD  Lancet Device MISC 1 each by Does not apply route 3 (three) times daily before meals. May substitute to any manufacturer covered by patient's insurance. 08/18/22 09/17/22  Almon Hercules, MD  levothyroxine (SYNTHROID) 125 MCG tablet Take 1 tablet (125 mcg total) by mouth daily before breakfast. 08/18/22   Almon Hercules, MD  pantoprazole (PROTONIX) 20 MG tablet Take 1 tablet (20 mg total) by mouth daily. 01/10/22   Bethann Berkshire, MD      Allergies    Lantus [insulin glargine], Levemir [insulin detemir], Pineapple, Codeine, Invokana [canagliflozin], Penicillins, Sulfa antibiotics, Tradjenta [linagliptin], Vioxx [rofecoxib], and Exelon [rivastigmine]    Review of Systems   Review of Systems  Physical Exam Updated Vital Signs BP 136/81   Pulse (!) 110   Temp 98.5 F (36.9 C) (Oral)   Resp (!) 24   SpO2 100%  Physical Exam Vitals reviewed.  HENT:     Head: Atraumatic.  Eyes:     Pupils: Pupils are equal, round, and reactive to light.  Cardiovascular:  Rate and Rhythm: Tachycardia present.  Pulmonary:     Breath sounds: No wheezing or rhonchi.  Abdominal:     Tenderness: There is no abdominal tenderness.  Neurological:     Comments: Sitting in bed with eyes closed.  Preferentially will hold eyelids closed.  Will however follow some commands.  Will squeeze hands to command.  However is not verbal forming.     ED Results / Procedures / Treatments   Labs (all labs ordered are listed, but only abnormal results are displayed) Labs Reviewed  COMPREHENSIVE METABOLIC PANEL - Abnormal; Notable for the following components:      Result Value   Sodium 156 (*)    Chloride 121 (*)     CO2 18 (*)    Glucose, Bld 134 (*)    BUN 68 (*)    Creatinine, Ser 2.08 (*)    Albumin 3.0 (*)    Total Bilirubin 1.9 (*)    GFR, Estimated 23 (*)    Anion gap 17 (*)    All other components within normal limits  CBC WITH DIFFERENTIAL/PLATELET - Abnormal; Notable for the following components:   WBC 22.9 (*)    RBC 5.50 (*)    Hemoglobin 15.1 (*)    HCT 46.9 (*)    RDW 16.4 (*)    Neutro Abs 19.7 (*)    Monocytes Absolute 1.8 (*)    All other components within normal limits  URINALYSIS, W/ REFLEX TO CULTURE (INFECTION SUSPECTED) - Abnormal; Notable for the following components:   APPearance TURBID (*)    Hgb urine dipstick SMALL (*)    Ketones, ur 5 (*)    Protein, ur 30 (*)    Leukocytes,Ua LARGE (*)    Bacteria, UA MANY (*)    All other components within normal limits  CBG MONITORING, ED - Abnormal; Notable for the following components:   Glucose-Capillary 120 (*)    All other components within normal limits  I-STAT VENOUS BLOOD GAS, ED - Abnormal; Notable for the following components:   pH, Ven 7.493 (*)    pCO2, Ven 28.5 (*)    pO2, Ven 60 (*)    Sodium 155 (*)    Potassium 5.4 (*)    Calcium, Ion 1.08 (*)    All other components within normal limits  URINE CULTURE  CULTURE, BLOOD (ROUTINE X 2)  CULTURE, BLOOD (ROUTINE X 2)  I-STAT CG4 LACTIC ACID, ED    EKG None  Radiology DG Chest Portable 1 View  Result Date: 08/24/2022 CLINICAL DATA:  Provided history: Altered mental status. EXAM: PORTABLE CHEST 1 VIEW COMPARISON:  Prior chest radiographs 08/15/2022 and earlier. Chest CT 05/01/2019. FINDINGS: Heart size within normal limits. Aortic atherosclerosis. Known right lower lobe pulmonary nodule unchanged from the recent prior chest radiograph of 08/15/2022. Subtle ill-defined opacity within the left lung base. No evidence of pleural effusion or pneumothorax. No acute osseous abnormality identified. Spondylosis and dextrocurvature of the thoracic spine.  IMPRESSION: 1. Shallow inspiration radiograph. 2. Subtle ill-defined opacity within the left lung base, which may reflect atelectasis or infection. 3. Aortic Atherosclerosis (ICD10-I70.0). Electronically Signed   By: Jackey Loge D.O.   On: 08/24/2022 11:01    Procedures Procedures    Medications Ordered in ED Medications  cefTRIAXone (ROCEPHIN) 1 g in sodium chloride 0.9 % 100 mL IVPB (has no administration in time range)  lactated ringers bolus 1,000 mL (1,000 mLs Intravenous New Bag/Given 08/24/22 1203)    ED Course/ Medical Decision Making/  A&P                                 Medical Decision Making Amount and/or Complexity of Data Reviewed Labs: ordered. Radiology: ordered.  Risk Decision regarding hospitalization.   Patient mental status change.  Recent admission for DKA and acute kidney injury.  Also hypothyroidism due to noncompliance.  Differential diagnosis includes dehydration, encephalopathy, UTI.  No trauma.  Potentially worsening of dementia also.  Will get basic blood work.  Will get urinalysis.  Will get venous gas.  CBG initially reassuring.  I have reviewed recent discharge note and palliative medicine note   White count is elevated but has been afebrile.  No coughing.  Creatinine is elevated.  I think most likely due to dehydration particular to elevated BUN to creatinine ratio.  Urinalysis required catheterization.  Urinalysis does show infection.  Will treat.  Severe sepsis felt less likely I think the elevated creatinine is secondary to dehydration.  I have now added lactic acid and blood cultures however.  Will admit to internal medicine.  CRITICAL CARE Performed by: Benjiman Core Total critical care time: 30 minutes Critical care time was exclusive of separately billable procedures and treating other patients. Critical care was necessary to treat or prevent imminent or life-threatening deterioration. Critical care was time spent personally by me on the  following activities: development of treatment plan with patient and/or surrogate as well as nursing, discussions with consultants, evaluation of patient's response to treatment, examination of patient, obtaining history from patient or surrogate, ordering and performing treatments and interventions, ordering and review of laboratory studies, ordering and review of radiographic studies, pulse oximetry and re-evaluation of patient's condition.         Final Clinical Impression(s) / ED Diagnoses Final diagnoses:  Dehydration  AKI (acute kidney injury) (HCC)  Encephalopathy    Rx / DC Orders ED Discharge Orders     None         Benjiman Core, MD 08/24/22 1408

## 2022-08-24 NOTE — ED Notes (Signed)
ED TO INPATIENT HANDOFF REPORT  ED Nurse Name and Phone #: Rodney Booze [phone]  S Name/Age/Gender Allison [name] 88 y.o. female Room/Bed: 037C/037C  Code Status   Code Status: Full Code  Home/SNF/Other Home Patient oriented to: self Is this baseline?  UTA  Triage Complete: Triage complete  Chief Complaint AKI (acute kidney injury) (HCC) [N17.9]  Triage Note Pt BIB [facility] from home due to altered mental status.  Family states this has been ongoing for the a while.  Pt was just discharged Friday 08/18/22 for same.  Hx dementia and stroke (right sided deficits). Family is considering that she needs to placed back to facility.  VS BP 160/80, CBG 140, HR 100, NS. 22g right hand.   Allergies Allergies  Allergen Reactions   Lantus [Insulin Glargine] Itching and Swelling    angioedema   Levemir [Insulin Detemir] Itching and Swelling    angioedema   Pineapple Anaphylaxis    Not listed on the [facility]   Codeine Other (See Comments)    Unknown reaction per [facility]   Invokana [Canagliflozin] Other (See Comments)    Constipation, nightmares Not listed on the [facility]   Penicillins Other (See Comments)    Unknown reaction per [facility]    Sulfa Antibiotics Other (See Comments)    Unknown reaction per [facility]   Tradjenta [Linagliptin] Other (See Comments)    Unknown   Vioxx [Rofecoxib] Other (See Comments)    Unknown reaction per [facility]   Exelon [Rivastigmine] Rash    Level of Care/Admitting Diagnosis ED Disposition     ED Disposition  Admit   Condition  --   Comment  Hospital Area: [facility] [[id number]]  Level of Care: Med-Surg [16]  May admit patient to [facility] or [facility] if equivalent level of care is available:: No  Covid Evaluation: Asymptomatic - no recent exposure (last 10 days) testing not required  Diagnosis: AKI (acute kidney injury) [facility]) [[id number]]  Admitting Physician: Clydie Braun [[id number]]  Attending Physician: Clydie Braun [[id number]]   Certification:: I certify this patient will need inpatient services for at least 2 midnights  Estimated Length of Stay: 2          B Medical/Surgery History Past Medical History:  Diagnosis Date   Acute ischemic stroke (HCC) 10/29/2015   Carpal tunnel syndrome    Dementia (HCC)    Diabetes mellitus without complication (HCC)    Dysphagia    GERD (gastroesophageal reflux disease)    Hiatal hernia    HLD (hyperlipidemia)    Hx of adenomatous colonic polyps 06/19/2014   Hyperlipidemia    Hypertension    Hypothyroidism    Onychomycosis 03/05/2012   Pain in lower limb 04/18/2013   Sciatic nerve pain    Stroke-like symptoms 10/28/2015   Vertigo    Past Surgical History:  Procedure Laterality Date   BLADDER SURGERY     Mesh implant   COLONOSCOPY     LARYNX SURGERY     vocal cords   PARTIAL HYSTERECTOMY  1978   VESICOVAGINAL FISTULA CLOSURE W/ TAH  1976     A IV Location/Drains/Wounds Patient Lines/Drains/Airways Status     Active Line/Drains/Airways     Name Placement date Placement time Site Days   Peripheral IV 08/24/22 22 G Anterior;Right Hand 08/24/22  0918  Hand  less than 1            Intake/Output Last 24 hours No intake or output data in the 24 hours  ending 08/24/22 1603  Labs/Imaging Results for orders placed or performed during the hospital encounter of 08/24/22 (from the past 48 hour(s))  CBG monitoring, ED     Status: Abnormal   Collection Time: 08/24/22 10:02 AM  Result Value Ref Range   Glucose-Capillary 120 (H) 70 - 99 mg/dL    Comment: Glucose reference range applies only to samples taken after fasting for at least 8 hours.  Urinalysis, w/ Reflex to Culture (Infection Suspected) -Urine, Unspecified Source     Status: Abnormal   Collection Time: 08/24/22 10:08 AM  Result Value Ref Range   Specimen Source URINE, UNSPE    Color, Urine YELLOW YELLOW   APPearance TURBID (A) CLEAR   Specific Gravity, Urine 1.014 1.005 - 1.030   pH 5.0 5.0 -  8.0   Glucose, UA NEGATIVE NEGATIVE mg/dL   Hgb urine dipstick SMALL (A) NEGATIVE   Bilirubin Urine NEGATIVE NEGATIVE   Ketones, ur 5 (A) NEGATIVE mg/dL   Protein, ur 30 (A) NEGATIVE mg/dL   Nitrite NEGATIVE NEGATIVE   Leukocytes,Ua LARGE (A) NEGATIVE   RBC / HPF >50 0 - 5 RBC/hpf   WBC, UA 21-50 0 - 5 WBC/hpf    Comment:        Reflex urine culture not performed if WBC <=10, OR if Squamous epithelial cells >5. If Squamous epithelial cells >5 suggest recollection.    Bacteria, UA MANY (A) NONE SEEN   Squamous Epithelial / HPF 0-5 0 - 5 /HPF   Budding Yeast PRESENT     Comment: Performed at [facility], [address], [place], [place] [address]  Comprehensive metabolic panel     Status: Abnormal   Collection Time: 08/24/22 10:33 AM  Result Value Ref Range   Sodium 156 (H) 135 - 145 mmol/L   Potassium 4.1 3.5 - 5.1 mmol/L    Comment: HEMOLYSIS AT THIS LEVEL MAY AFFECT RESULT   Chloride 121 (H) 98 - 111 mmol/L   CO2 18 (L) 22 - 32 mmol/L   Glucose, Bld 134 (H) 70 - 99 mg/dL    Comment: Glucose reference range applies only to samples taken after fasting for at least 8 hours.   BUN 68 (H) 8 - 23 mg/dL   Creatinine, Ser 6.21 (H) 0.44 - 1.00 mg/dL   Calcium 9.7 8.9 - 30.8 mg/dL   Total Protein 6.6 6.5 - 8.1 g/dL   Albumin 3.0 (L) 3.5 - 5.0 g/dL   AST 32 15 - 41 U/L    Comment: HEMOLYSIS AT THIS LEVEL MAY AFFECT RESULT   ALT 19 0 - 44 U/L    Comment: HEMOLYSIS AT THIS LEVEL MAY AFFECT RESULT   Alkaline Phosphatase 73 38 - 126 U/L   Total Bilirubin 1.9 (H) 0.3 - 1.2 mg/dL    Comment: HEMOLYSIS AT THIS LEVEL MAY AFFECT RESULT   GFR, Estimated 23 (L) >60 mL/min    Comment: (NOTE) Calculated using the CKD-EPI Creatinine Equation (2021)    Anion gap 17 (H) 5 - 15    Comment: Performed at [facility], [address] [place], [place] [address]  CBC with Differential     Status: Abnormal   Collection Time: 08/24/22 10:33 AM  Result Value Ref Range   WBC 22.9 (H) 4.0  - 10.5 K/uL   RBC 5.50 (H) 3.87 - 5.11 MIL/uL   Hemoglobin 15.1 (H) 12.0 - 15.0 g/dL   HCT 69.6 (H) 29.5 - 28.4 %   MCV 85.3 80.0 -  100.0 fL   MCH 27.5 26.0 - 34.0 pg   MCHC 32.2 30.0 - 36.0 g/dL   RDW 29.5 (H) 18.8 - 41.6 %   Platelets  150 - 400 K/uL    PLATELET CLUMPS NOTED ON SMEAR, COUNT APPEARS ADEQUATE    Comment: REPEATED TO VERIFY   nRBC 0.0 0.0 - 0.2 %   Neutrophils Relative % 86 %   Neutro Abs 19.7 (H) 1.7 - 7.7 K/uL   Lymphocytes Relative 6 %   Lymphs Abs 1.4 0.7 - 4.0 K/uL   Monocytes Relative 8 %   Monocytes Absolute 1.8 (H) 0.1 - 1.0 K/uL   Eosinophils Relative 0 %   Eosinophils Absolute 0.0 0.0 - 0.5 K/uL   Basophils Relative 0 %   Basophils Absolute 0.0 0.0 - 0.1 K/uL   WBC Morphology MORPHOLOGY UNREMARKABLE    RBC Morphology MORPHOLOGY UNREMARKABLE    Smear Review Normal platelet morphology    Abs Immature Granulocytes 0.00 0.00 - 0.07 K/uL    Comment: Performed at [facility], [address] [place], [place] [address]  I-Stat venous blood gas, ED     Status: Abnormal   Collection Time: 08/24/22 10:39 AM  Result Value Ref Range   pH, Ven 7.493 (H) 7.25 - 7.43   pCO2, Ven 28.5 (L) 44 - 60 mmHg   pO2, Ven 60 (H) 32 - 45 mmHg   Bicarbonate 21.8 20.0 - 28.0 mmol/L   TCO2 23 22 - 32 mmol/L   O2 Saturation 93 %   Acid-Base Excess 0.0 0.0 - 2.0 mmol/L   Sodium 155 (H) 135 - 145 mmol/L   Potassium 5.4 (H) 3.5 - 5.1 mmol/L   Calcium, Ion 1.08 (L) 1.15 - 1.40 mmol/L   HCT 44.0 36.0 - 46.0 %   Hemoglobin 15.0 12.0 - 15.0 g/dL   Sample type VENOUS    DG Chest Portable 1 View  Result Date: 08/24/2022 CLINICAL DATA:  Provided history: Altered mental status. EXAM: PORTABLE CHEST 1 VIEW COMPARISON:  Prior chest radiographs 08/15/2022 and earlier. Chest CT 05/01/2019. FINDINGS: Heart size within normal limits. Aortic atherosclerosis. Known right lower lobe pulmonary nodule unchanged from the recent prior chest radiograph of 08/15/2022. Subtle ill-defined opacity  within the left lung base. No evidence of pleural effusion or pneumothorax. No acute osseous abnormality identified. Spondylosis and dextrocurvature of the thoracic spine. IMPRESSION: 1. Shallow inspiration radiograph. 2. Subtle ill-defined opacity within the left lung base, which may reflect atelectasis or infection. 3. Aortic Atherosclerosis (ICD10-I70.0). Electronically Signed   By: Jackey Loge D.O.   On: 08/24/2022 11:01    Pending Labs Unresulted Labs (From admission, onward)     Start     Ordered   08/25/22 0500  CBC  Tomorrow morning,   R        08/24/22 1455   08/25/22 0500  Basic metabolic panel  Daily,   R      08/24/22 1455   08/24/22 1404  Culture, blood (routine x 2)  BLOOD CULTURE X 2,   R (with STAT occurrences)      08/24/22 1403   08/24/22 1008  Urine Culture  Once,   R        08/24/22 1008            Vitals/Pain Today's Vitals   08/24/22 0959 08/24/22 1205 08/24/22 1315 08/24/22 1530  BP: (!) 165/88  136/81 (!) 186/154  Pulse: (!) 102  (!) 110   Resp: 19  (!)  24 (!) 23  Temp: 98.1 F (36.7 C) 98.5 F (36.9 C)    TempSrc: Oral Oral    SpO2: 97%  100%     Isolation Precautions No active isolations  Medications Medications  cefTRIAXone (ROCEPHIN) 1 g in sodium chloride 0.9 % 100 mL IVPB (1 g Intravenous New Bag/Given 08/24/22 1551)  enoxaparin (LOVENOX) injection 40 mg (40 mg Subcutaneous Given 08/24/22 1556)  sodium chloride flush (NS) 0.9 % injection 3 mL (3 mLs Intravenous Given 08/24/22 1558)  acetaminophen (TYLENOL) tablet 650 mg (has no administration in time range)    Or  acetaminophen (TYLENOL) suppository 650 mg (has no administration in time range)  albuterol (PROVENTIL) (2.5 MG/3ML) 0.083% nebulizer solution 2.5 mg (has no administration in time range)  insulin aspart protamine- aspart (NOVOLOG MIX 70/30) injection 15 Units (has no administration in time range)  0.45 % sodium chloride infusion (has no administration in time range)  cefTRIAXone  (ROCEPHIN) 1 g in sodium chloride 0.9 % 100 mL IVPB (has no administration in time range)  lactated ringers bolus 1,000 mL (0 mLs Intravenous Stopped 08/24/22 1424)    Mobility non-ambulatory     Focused Assessments Cardiac Assessment Handoff:    Lab Results  Component Value Date   CKTOTAL 39 07/19/2022   No results found for: "DDIMER" Does the Patient currently have chest pain? No    R Recommendations: See Admitting Provider Note  Report given to:   Additional Notes:

## 2022-08-24 NOTE — H&P (Addendum)
History and Physical    Patient: Allison [name] RUE:[id number] DOB: 24-Sep-1934 DOA: 08/24/2022 DOS: the patient was seen and examined on 08/24/2022 PCP: [facility]  Patient coming from: Home with sister via EMS  Chief Complaint:  Chief Complaint  Patient presents with   Altered Mental Status   HPI: Allison [name] is a 88 y.o. female with medical history significant of hypertension, hyperlipidemia, CVA without residual deficits, diabetes mellitus type 2, hypothyroidism, and dementia who present for altered mental status.  Normally she has been able to ambulate with use of cane and is talkative.  History is limited from the patient at this time.  Patient is able to state her name and tells me that she is in the hospital.  She does report having some pain with urinating, but does not speak much more when asked about any other complaints.  She had just recently been hospitalized 8/3-8/6 with altered mental status due to DKA with acute kidney injury and hypernatremia.  Patient's son makes note that after patient got back home she wasn't really any better.  She was not talking or moving around like normal.  Patient lives at home with her sister who is in her 58s, but other family members had been coming in to try and help as well.  They were trying to push fluids, but patient was not wanting to eat or drink.  They have been giving her medicines.  She had not wanted to get up and family members were having to pick her up and move her to different places.  There had not been any reported complaints of cough, nausea, vomiting, or diarrhea.  She had been diagnosed with COVID-19 about 2 month ago and was treated with Paxlovid.  In the emergency department patient was noted to be afebrile with pulse 10 2-1 10, respirations 19-24, blood pressures elevated up to 165/88, and all other vital signs maintained.  Labs significant for WBC 22.9, platelet clumping, sodium 156 chloride  121, BUN 68, creatinine 2.08, and total bilirubin 1.9.  Chest x-ray noted shallow inspiration with subtle ill-defined opacity within the left lung base which may reflect atelectasis versus infection. Urinalysis noted large leukocytes, many bacteria, small hemoglobin, greater than 50 RBCs/hpf, and 21-50 WBCs.  Patient has been given 1 L of lactated Ringer's and Rocephin IV.  Review of Systems: Unable to review all systems due to lack of cooperation from patient. Past Medical History:  Diagnosis Date   Acute ischemic stroke (HCC) 10/29/2015   Carpal tunnel syndrome    Dementia (HCC)    Diabetes mellitus without complication (HCC)    Dysphagia    GERD (gastroesophageal reflux disease)    Hiatal hernia    HLD (hyperlipidemia)    Hx of adenomatous colonic polyps 06/19/2014   Hyperlipidemia    Hypertension    Hypothyroidism    Onychomycosis 03/05/2012   Pain in lower limb 04/18/2013   Sciatic nerve pain    Stroke-like symptoms 10/28/2015   Vertigo    Past Surgical History:  Procedure Laterality Date   BLADDER SURGERY     Mesh implant   COLONOSCOPY     LARYNX SURGERY     vocal cords   PARTIAL HYSTERECTOMY  1978   VESICOVAGINAL FISTULA CLOSURE W/ TAH  1976   Social History:  reports that she has never smoked. She has never used smokeless tobacco. She reports that she does not drink alcohol and does not use drugs.  Allergies  Allergen Reactions   Lantus [Insulin Glargine] Itching and Swelling    angioedema   Levemir [Insulin Detemir] Itching and Swelling    angioedema   Pineapple Anaphylaxis    Not listed on the [facility]   Codeine Other (See Comments)    Unknown reaction per [facility]   Invokana [Canagliflozin] Other (See Comments)    Constipation, nightmares Not listed on the [facility]   Penicillins Other (See Comments)    Unknown reaction per [facility]    Sulfa Antibiotics Other (See Comments)    Unknown reaction per [facility]   Tradjenta [Linagliptin] Other (See Comments)    Unknown   Vioxx  [Rofecoxib] Other (See Comments)    Unknown reaction per [facility]   Exelon [Rivastigmine] Rash    Family History  Problem Relation Age of Onset   Alzheimer's disease Mother    Glaucoma Mother    Heart disease Father    Alzheimer's disease Father    Asthma Sister    Allergies Sister    Allergies Sister    Sleep apnea Sister    Heart disease Sister    Breast cancer Maternal Aunt    Pancreatic cancer Maternal Aunt    Prostate cancer Maternal Uncle    Prostate cancer Paternal Uncle    Alcoholism Maternal Uncle    Kidney disease Maternal Aunt    Heart disease Maternal Aunt     Prior to Admission medications   Medication Sig Start Date End Date Taking? Authorizing Provider  acetaminophen (TYLENOL) 500 MG tablet Take 500 mg by mouth daily as needed for mild pain.    [provider]  amLODipine (NORVASC) 10 MG tablet Take 1 tablet (10 mg total) by mouth daily. 08/18/22   Almon Hercules, MD  aspirin EC 81 MG tablet Take 1 tablet (81 mg total) by mouth daily. Swallow whole. 08/18/22 08/18/23  Almon Hercules, MD  atorvastatin (LIPITOR) 40 MG tablet Take 1 tablet (40 mg total) by mouth daily. 08/18/22   Almon Hercules, MD  Blood Glucose Monitoring Suppl DEVI 1 each by Does not apply route in the morning, at noon, and at bedtime. May substitute to any manufacturer covered by patient's insurance. 08/18/22   Almon Hercules, MD  calcium carbonate (TUMS EX) 750 MG chewable tablet Chew 2 tablets by mouth daily as needed (indigestion).    [provider]  diclofenac Sodium (VOLTAREN ARTHRITIS PAIN) 1 % GEL Apply 2 g topically daily as needed (for pain).    [provider]  Glucose Blood (BLOOD GLUCOSE TEST STRIPS) STRP 1 each by In Vitro route 3 (three) times daily before meals. May substitute to any manufacturer covered by patient's insurance. 08/18/22 10/24/22  Almon Hercules, MD  insulin aspart protamine - aspart (NOVOLOG 70/30 MIX) (70-30) 100 UNIT/ML FlexPen Inject 15 Units into the skin 2  (two) times daily with a meal. 08/18/22   Almon Hercules, MD  Insulin Pen Needle (PEN NEEDLES 3/16") 31G X 5 MM MISC 1 Pen by Does not apply route in the morning and at bedtime. 08/18/22   Almon Hercules, MD  Lancet Device MISC 1 each by Does not apply route 3 (three) times daily before meals. May substitute to any manufacturer covered by patient's insurance. 08/18/22 09/17/22  Almon Hercules, MD  levothyroxine (SYNTHROID) 125 MCG tablet Take 1 tablet (125 mcg total) by mouth daily before breakfast. 08/18/22   Almon Hercules, MD  pantoprazole (PROTONIX) 20 MG tablet Take 1 tablet (20 mg  total) by mouth daily. 01/10/22   Bethann Berkshire, MD    Physical Exam: Vitals:   08/24/22 0959 08/24/22 1205 08/24/22 1315  BP: (!) 165/88  136/81  Pulse: (!) 102  (!) 110  Resp: 19  (!) 24  Temp: 98.1 F (36.7 C) 98.5 F (36.9 C)   TempSrc: Oral Oral   SpO2: 97%  100%    Constitutional: Elderly female currently in no acute distress Eyes: PERRL, lids and conjunctivae normal ENMT: Mucous membranes are dry.  Fair dentition.   Neck: normal, supple, no masses, no thyromegaly Respiratory: clear to auscultation bilaterally, no wheezing, no crackles. Normal respiratory effort. No accessory muscle use.  Cardiovascular: Tachycardic. No extremity edema. 2+ pedal pulses. No carotid bruits.  Abdomen: no tenderness, no masses palpated. No hepatosplenomegaly. Bowel sounds positive.  Musculoskeletal: no clubbing / cyanosis. No joint deformity upper and lower extremities. Good ROM, no contractures. Normal muscle tone.  Skin: no rashes, lesions, ulcers. No induration Neurologic: CN 2-12 grossly intact.  Patient appears able to move all  extremities, but not actively participating Psychiatric: Normal judgment and insight. Alert and oriented x person and place.  On affect Data Reviewed:  EKG reveals sinus tachycardia 122 bpm reviewed labs, imaging, and pertinent records as noted above in HPI.  Assessment and Plan:  Sepsis  secondary to UTI Patient stated that she had pain with urination.  Noted to be tachycardic, tachypneic, and have elevated white blood cell count of 22.9 meeting SIRS criteria. Urinalysis noted large leukocytes, many bacteria, small hemoglobin, greater than 50 RBCs/hpf, and 21-50 WBCs as likely source.  Patient has been started empirically on Rocephin IV.  Urine cultures have been ordered.  Blood cultures and lactic acid was obtained after initial therapies have been given.  Other causes for infection include possibility of pneumonia. -Admit to a telemetry bed -Follow-up blood and urine cultures -Check lactic acid -Continue empiric antibiotics of Rocephin -Continue to monitor  Acute kidney injury secondary to dehydration Patient presents with creatinine elevated up to 2.08 with BUN 68.    Baseline creatinine noted to be 0.81 on 8/6.  Elevated BUN to creatinine ratio suggest prerenal cause. Patient had been given 1 L lactated Ringer's. -Admit to a telemetry bed -Strict I&Os -0.45% normal saline IV fluids at 100 mL/h -Continue to monitor kidney function  Hypernatremia Acute.  Sodium elevated at 156 on admission.  Thought secondary to dehydration. -IV fluid as noted above.  If hypernatremia seems to worsen consider switching to D5W.  Acute Metabolic encephalopathy Alzheimer's dementia Patient noted to be acutely altered again after recently being admitted for the same.  Again patient noted to appear to be dehydrated, but was not in DKA. -Delirium precautions -Neurochecks -Transitions of care for placement  Uncontrolled diabetes mellitus type 2, without long-term use of insulin Last available hemoglobin A1c was 11.5.  Just recommended to be discharged on 70/30 insulin 15 units twice daily. -Hypoglycemic protocols -Reduced 70/30 insulin 10 units twice daily due to limited p.o. intake -CBGs before every meal with very sensitive SSI  Essential hypertension -Continue  amlodipine  Hyperlipidemia -Continue atorvastatin  Hypothyroidism Last TSH- 7.686 with free T4-0.91 on 8/5.   -Continue levothyroxine -Recommend repeat TSH check in 4-5 weeks  GERD -Continue PPI  DVT prophylaxis: Lovenox Advance Care Planning:   Code Status: Full Code   Consults: None  Family Communication: Son updated over the phone  Severity of Illness: The appropriate patient status for this patient is INPATIENT. Inpatient status is judged to  be reasonable and necessary in order to provide the required intensity of service to ensure the patient's safety. The patient's presenting symptoms, physical exam findings, and initial radiographic and laboratory data in the context of their chronic comorbidities is felt to place them at high risk for further clinical deterioration. Furthermore, it is not anticipated that the patient will be medically stable for discharge from the hospital within 2 midnights of admission.   * I certify that at the point of admission it is my clinical judgment that the patient will require inpatient hospital care spanning beyond 2 midnights from the point of admission due to high intensity of service, high risk for further deterioration and high frequency of surveillance required.*  Author: Clydie Braun, MD 08/24/2022 2:31 PM  For on call review [email or web address].

## 2022-08-24 NOTE — ED Triage Notes (Signed)
Pt BIB [facility] from home due to altered mental status.  Family states this has been ongoing for the a while.  Pt was just discharged Friday 08/18/22 for same.  Hx dementia and stroke (right sided deficits). Family is considering that she needs to placed back to facility.  VS BP 160/80, CBG 140, HR 100, NS. 22g right hand.

## 2022-08-25 ENCOUNTER — Encounter (HOSPITAL_COMMUNITY): Payer: Self-pay | Admitting: Internal Medicine

## 2022-08-25 ENCOUNTER — Inpatient Hospital Stay (HOSPITAL_COMMUNITY): Payer: Medicare (Managed Care)

## 2022-08-25 DIAGNOSIS — A419 Sepsis, unspecified organism: Secondary | ICD-10-CM | POA: Diagnosis not present

## 2022-08-25 DIAGNOSIS — N390 Urinary tract infection, site not specified: Secondary | ICD-10-CM | POA: Diagnosis not present

## 2022-08-25 LAB — BASIC METABOLIC PANEL
Anion gap: 18 — ABNORMAL HIGH (ref 5–15)
BUN: 52 mg/dL — ABNORMAL HIGH (ref 8–23)
CO2: 20 mmol/L — ABNORMAL LOW (ref 22–32)
Calcium: 9.5 mg/dL (ref 8.9–10.3)
Chloride: 117 mmol/L — ABNORMAL HIGH (ref 98–111)
Creatinine, Ser: 1.7 mg/dL — ABNORMAL HIGH (ref 0.44–1.00)
GFR, Estimated: 29 mL/min — ABNORMAL LOW (ref >60)
Glucose, Bld: 415 mg/dL — ABNORMAL HIGH (ref 70–99)
Potassium: 3.5 mmol/L (ref 3.5–5.1)
Sodium: 155 mmol/L — ABNORMAL HIGH (ref 135–145)

## 2022-08-25 LAB — GLUCOSE, CAPILLARY
Glucose-Capillary: 296 mg/dL — ABNORMAL HIGH (ref 70–99)
Glucose-Capillary: 327 mg/dL — ABNORMAL HIGH (ref 70–99)
Glucose-Capillary: 337 mg/dL — ABNORMAL HIGH (ref 70–99)
Glucose-Capillary: 310 mg/dL — ABNORMAL HIGH (ref 70–99)

## 2022-08-25 MED ORDER — VANCOMYCIN HCL 500 MG/100ML IV SOLN
500.0000 mg | INTRAVENOUS | Status: DC
  Administered 2022-08-26 – 2022-08-27 (×2): 500 mg via INTRAVENOUS
  Filled 2022-08-25 (×3): qty 100

## 2022-08-25 MED ORDER — VANCOMYCIN HCL IN DEXTROSE 1-5 GM/200ML-% IV SOLN
1000.0000 mg | Freq: Once | INTRAVENOUS | Status: AC
  Administered 2022-08-26: 1000 mg via INTRAVENOUS
  Filled 2022-08-25: qty 200

## 2022-08-25 MED ORDER — INSULIN NPH (HUMAN) (ISOPHANE) 100 UNIT/ML ~~LOC~~ SUSP
7.0000 [IU] | Freq: Two times a day (BID) | SUBCUTANEOUS | Status: DC
  Administered 2022-08-25 – 2022-08-26 (×2): 7 [IU] via SUBCUTANEOUS
  Filled 2022-08-25 (×2): qty 10

## 2022-08-25 MED ORDER — SODIUM CHLORIDE 0.9 % IV SOLN
2.0000 g | Freq: Once | INTRAVENOUS | Status: AC
  Administered 2022-08-25: 2 g via INTRAVENOUS
  Filled 2022-08-25: qty 12.5

## 2022-08-25 MED ORDER — INSULIN ASPART 100 UNIT/ML IJ SOLN
0.0000 [IU] | INTRAMUSCULAR | Status: DC
  Administered 2022-08-25: 5 [IU] via SUBCUTANEOUS
  Administered 2022-08-26: 2 [IU] via SUBCUTANEOUS
  Administered 2022-08-26: 5 [IU] via SUBCUTANEOUS
  Administered 2022-08-26: 7 [IU] via SUBCUTANEOUS
  Administered 2022-08-26: 5 [IU] via SUBCUTANEOUS
  Administered 2022-08-26: 3 [IU] via SUBCUTANEOUS
  Administered 2022-08-26: 1 [IU] via SUBCUTANEOUS
  Administered 2022-08-27: 2 [IU] via SUBCUTANEOUS
  Administered 2022-08-27: 3 [IU] via SUBCUTANEOUS
  Administered 2022-08-27: 2 [IU] via SUBCUTANEOUS
  Administered 2022-08-27: 1 [IU] via SUBCUTANEOUS
  Administered 2022-08-27: 2 [IU] via SUBCUTANEOUS
  Administered 2022-08-28 (×2): 3 [IU] via SUBCUTANEOUS
  Administered 2022-08-28: 5 [IU] via SUBCUTANEOUS
  Administered 2022-08-28: 3 [IU] via SUBCUTANEOUS
  Administered 2022-08-28: 5 [IU] via SUBCUTANEOUS
  Administered 2022-08-28 – 2022-08-29 (×2): 2 [IU] via SUBCUTANEOUS
  Administered 2022-08-29 (×2): 3 [IU] via SUBCUTANEOUS
  Administered 2022-08-29: 2 [IU] via SUBCUTANEOUS
  Administered 2022-08-29 – 2022-08-30 (×2): 5 [IU] via SUBCUTANEOUS
  Administered 2022-08-30: 2 [IU] via SUBCUTANEOUS

## 2022-08-25 MED ORDER — DEXTROSE 5 % IV SOLN: INTRAVENOUS | Status: DC

## 2022-08-25 MED ORDER — SODIUM CHLORIDE 0.9 % IV SOLN
2.0000 g | INTRAVENOUS | Status: AC
  Administered 2022-08-27 – 2022-08-30 (×4): 2 g via INTRAVENOUS
  Filled 2022-08-25 (×6): qty 12.5

## 2022-08-25 NOTE — Progress Notes (Signed)
Pharmacy Antibiotic Note  Allison [name] is a 88 y.o. female admitted on 08/24/2022 with pneumonia.  Pharmacy has been consulted for Vancomycin dosing.   CT chest with ?multi-focal PNA  WBC elevated, noted renal dysfunction  Plan: Vancomycin 1000 mg IV x 1, then 500 mg IV q24h >>>Estimated AUC: 499 Already on Cefepime Trend WBC, temp, renal function  F/U infectious work-up Drug levels as indicated   Height: 5\' 4"  (162.6 cm) Weight: 64.1 kg (141 lb 5 oz) IBW/kg (Calculated) : 54.7  Temp (24hrs), Avg:98.1 F (36.7 C), Min:97.9 F (36.6 C), Max:98.4 F (36.9 C)  Recent Labs  Lab 08/24/22 1033 08/24/22 1703 08/24/22 2056 08/25/22 0716 08/25/22 1601  WBC 22.9*  --   --  26.4*  --   CREATININE 2.08*  --   --  1.87* 1.70*  LATICACIDVEN  --  2.5* 2.6*  --   --     Estimated Creatinine Clearance: 20.9 mL/min (A) (by C-G formula based on SCr of 1.7 mg/dL (H)).    Allergies  Allergen Reactions   Lantus [Insulin Glargine] Itching and Swelling    angioedema   Levemir [Insulin Detemir] Itching and Swelling    angioedema   Pineapple Anaphylaxis    Not listed on the [facility]   Codeine Other (See Comments)    Unknown reaction per [facility]   Invokana [Canagliflozin] Other (See Comments)    Constipation, nightmares Not listed on the [facility]   Penicillins Other (See Comments)    Unknown reaction per [facility]    Sulfa Antibiotics Other (See Comments)    Unknown reaction per [facility]   Tradjenta [Linagliptin] Other (See Comments)    Unknown   Vioxx [Rofecoxib] Other (See Comments)    Unknown reaction per [facility]   Exelon [Rivastigmine] Rash    Abran Duke, PharmD, BCPS Clinical Pharmacist Phone: [phone]

## 2022-08-25 NOTE — Evaluation (Signed)
Occupational Therapy Evaluation Patient Details Name: Allison [name] MRN: [id number] DOB: 1934/12/26 Today's Date: 08/25/2022   History of Present Illness 88 y.o. female presents to [facility] hospital on 08/24/2022 with AMS. UA concerning for UTI, pt also with AKI 2/2 dehydration. PMH includes dementia, DMII, hypothyroidism, CVA, HTN, GERD, HLD.   Clinical Impression   Pt presents with decline in function and safety with ADLs and ADL mobility with impaired strength, balance, endurance and cognition. PTA pt lived at home with her sister. Pt is a poor historian, therefore PLOF is unknown and there was no caregiver/family present to provide info. Pt recently d/c home from this hospital with family per family request for pt to return home. Pt with advanced dementia and demonstrates very inconsistent command following and initiation. Pt requires max A with self feeding, grooming, total A with UB/LB selfcare, toileting and requires max-total A + 2 for all functional mobility, with Poor sitting balance. Pt would benefit from acute OT services to address impairments to maximize level of function and safety      If plan is discharge home, recommend the following: Assistance with cooking/housework;Direct supervision/assist for medications management;Direct supervision/assist for financial management;Help with stairs or ramp for entrance;Assist for transportation;A lot of help with bathing/dressing/bathroom;Two people to help with walking and/or transfers    Functional Status Assessment  Patient has had a recent decline in their functional status and demonstrates the ability to make significant improvements in function in a reasonable and predictable amount of time.  Equipment Recommendations  Other (comment) (TBD at next venue of care)    Recommendations for Other Services       Precautions / Restrictions Precautions Precautions: Fall Precaution Comments: advanced dementia Restrictions Weight Bearing  Restrictions: No      Mobility Bed Mobility Overal bed mobility: Needs Assistance Bed Mobility: Supine to Sit, Sit to Supine     Supine to sit: Total assist, +2 for physical assistance, HOB elevated Sit to supine: Total assist, +2 for physical assistance, HOB elevated   General bed mobility comments: poor initiation of mobility, pt does elevate trunk, heavy posterior leaning    Transfers Overall transfer level: Needs assistance Equipment used: 2 person hand held assist Transfers: Sit to/from Stand Sit to Stand: Max assist, +2 physical assistance           General transfer comment: Pt assists with initiation of sit - stand      Balance                                           ADL either performed or assessed with clinical judgement   ADL Overall ADL's : Needs assistance/impaired   Eating/Feeding Details (indicate cue type and reason): pt demonstrates AROM and strength withing WFL to feed self, however required max verbal cues to intiate. Suspect family may have been having to feed her at home Grooming: Maximal assistance;Wash/dry hands;Wash/dry face Grooming Details (indicate cue type and reason): max hand over hand assist Upper Body Bathing: Total assistance   Lower Body Bathing: Total assistance   Upper Body Dressing : Total assistance   Lower Body Dressing: Total assistance     Toilet Transfer Details (indicate cue type and reason): max A +2 sit - stand form EOB, unabel to safely SPT to [facility] Toileting- Clothing Manipulation and Hygiene: Total assistance;Bed level         General ADL  Comments: pt with difficulty following commands     Vision Baseline Vision/History: 1 Wears glasses Ability to See in Adequate Light:  (unable to properly assess due to cognition)       Perception         Praxis         Pertinent Vitals/Pain Pain Assessment Pain Assessment: PAINAD Breathing: normal Negative Vocalization: none Facial Expression:  smiling or inexpressive Body Language: relaxed Consolability: no need to console PAINAD Score: 0 Pain Intervention(s): Monitored during session, Repositioned     Extremity/Trunk Assessment Upper Extremity Assessment Upper Extremity Assessment: Generalized weakness   Lower Extremity Assessment Lower Extremity Assessment: Defer to PT evaluation   Cervical / Trunk Assessment Cervical / Trunk Assessment: Kyphotic   Communication Communication Communication: Difficulty following commands/understanding;Difficulty communicating thoughts/reduced clarity of speech Following commands: Follows one step commands inconsistently;Follows multi-step commands inconsistently Cueing Techniques: Verbal cues;Tactile cues;Gestural cues;Visual cues   Cognition Arousal: Alert Behavior During Therapy: Flat affect Overall Cognitive Status: History of cognitive impairments - at baseline Area of Impairment: Orientation, Attention, Memory, Following commands, Safety/judgement, Awareness, Problem solving                 Orientation Level: Disoriented to, Place, Time, Situation   Memory: Decreased recall of precautions, Decreased short-term memory Following Commands: Follows one step commands inconsistently     Problem Solving: Slow processing, Decreased initiation       General Comments  tachy into 110s with activity    Exercises     Shoulder Instructions      Home Living Family/patient expects to be discharged to:: Skilled nursing facility Living Arrangements: Other relatives;Children Available Help at Discharge: Family;Available 24 hours/day Type of Home: House Home Access: Level entry     Home Layout: One level     Bathroom Shower/Tub: Tub/shower unit         Home Equipment: Cane - single point;BSC/3in1   Additional Comments: history obtained from previous admission as pt unable to provide accurate information      Prior Functioning/Environment Prior Level of Function :  Needs assist  Cognitive Assist : Mobility (cognitive);ADLs (cognitive)           Mobility Comments: ambulatory in the home with PRN use of cane until contracting COVID in July. Pt was requiring significant assistance to mobilize since discharge in early august ADLs Comments: assist for ADLs, total A IADLs        OT Problem List: Decreased strength;Decreased activity tolerance;Decreased safety awareness      OT Treatment/Interventions: Self-care/ADL training;Therapeutic exercise;Therapeutic activities;Patient/family education    OT Goals(Current goals can be found in the care plan section) Acute Rehab OT Goals Patient Stated Goal: none stated OT Goal Formulation: With patient Time For Goal Achievement: 09/08/22 Potential to Achieve Goals: Fair ADL Goals Pt Will Perform Eating: with mod assist;sitting Pt Will Perform Grooming: with mod assist;sitting Pt Will Perform Upper Body Dressing: with max assist;with mod assist;sitting Pt Will Transfer to Toilet: with max assist;with mod assist;with +2 assist;stand pivot transfer;bedside commode Additional ADL Goal #1: Pt will complete bed mobility to sit EOB in prep for grooming tasks Additional ADL Goal #2: Pt will sit EOB for ADL tasks with mod A for balance/support x 3-5 minutes  OT Frequency: Min 1X/week    Co-evaluation              AM-PAC OT "6 Clicks" Daily Activity     Outcome Measure Help from another person eating meals?: A Lot Help from another  person taking care of personal grooming?: A Lot Help from another person toileting, which includes using toliet, bedpan, or urinal?: Total Help from another person bathing (including washing, rinsing, drying)?: Total Help from another person to put on and taking off regular upper body clothing?: Total Help from another person to put on and taking off regular lower body clothing?: Total 6 Click Score: 8   End of Session Equipment Utilized During Treatment: Gait belt Nurse  Communication: Mobility status  Activity Tolerance: Patient tolerated treatment well Patient left: in bed;with bed alarm set;with call bell/phone within reach  OT Visit Diagnosis: Unsteadiness on feet (R26.81);Muscle weakness (generalized) (M62.81)                Time: 3086-5784 OT Time Calculation (min): 21 min Charges:  OT General Charges $OT Visit: 1 Visit OT Evaluation $OT Eval Moderate Complexity: 1 Mod   Galen Manila 08/25/2022, 1:29 PM

## 2022-08-25 NOTE — Progress Notes (Addendum)
**Note De-Identified vi Obfusction** PROGRESS NOTE    Allison [name]  HQI:[id number] DOB: Apr 13, 1934 DOA: 08/24/2022 PCP: [facility]  Chief Complint  Ptient presents with   Altered Mentl Sttus    Brief Nrrtive:   ATHELENE [name] is  88 y.o. femle with medicl history significnt of hypertension, hyperlipidemi, CVA without residul deficits, dibetes mellitus type 2, hypothyroidism, nd dementi who present for ltered mentl sttus.   She's been dmitted with sepsis due to UTI.   Assessment & Pln:   Active Problems:   Sepsis secondry to UTI (HCC)   Dehydrtion   AKI (cute kidney injury) (HCC)   Hyperntremi   Alzheimer's dementi (HCC)   Acute metbolic encephlopthy   Hypothyroidism   Dyslipidemi   Essentil hypertension   GERD   Uncontrolled type 2 dibetes mellitus with hyperglycemi, without long-term current use of insulin (HCC)  Gols of cre Discussed with son Alind Money.  With her encephlopthy, hyperntremi, AKI, infection - I'm concerned with her overll prognosis.  He's greeble to hving pllitive cre come by.    Sepsis secondry to UTI Leukocytosis, tchycrdi, tchypneic  UA ppers c/w UTI  Follow urine culture  Blood cultures pending CXR with ill defined opcity within left lung bse CT chest nd renl stone study pending With worsening leukocytosis, broden to cefepime   Acute kidney injury  Hyperntremi Mild improvement tody Follow renl stone CT  Continue IVF with hypotonic fluids given hyperntremi    Acute Metbolic encephlopthy Alzheimer's dementi Due to bove Delirium precutions Additionl workup s indicted Slp evl for sfety of PO intke    Uncontrolled dibetes mellitus type 2, without long-term use of insulin Lst vilble hemoglobin A1c ws 11.5.  Just recommended to be dischrged on 70/30 insulin 15 units twice dily. Q4hr SSI, NPH    Essentil hypertension Continue mlodipine    Hyperlipidemi Continue torvsttin   Hypothyroidism Lst TSH- 7.686 with free T4-0.91 on 8/5.   Continue levothyroxine Recommend repet TSH check in 4-5 weeks   GERD Continue PPI    DVT prophylxis: lovenox Code Sttus: full Fmily Communiction: son, Alind Money Disposition:   Sttus is: Inptient Remins inptient pproprite becuse: continued need for inptient cre   Consultnts:  none  Procedures:  none  Antimicrobils:  Anti-infectives (From dmission, onwrd)    Strt     Dose/Rte Route Frequency Ordered Stop   08/25/22 1000  cefTRIAXone (ROCEPHIN) 1 g in sodium chloride 0.9 % 100 mL IVPB        1 g 200 mL/hr over 30 Minutes Intrvenous Every 24 hours 08/24/22 1501     08/24/22 1415  cefTRIAXone (ROCEPHIN) 1 g in sodium chloride 0.9 % 100 mL IVPB        1 g 200 mL/hr over 30 Minutes Intrvenous  Once 08/24/22 1406 08/24/22 1644       Subjective: Difficult to understnd confused  Objective: Vitls:   08/24/22 1530 08/24/22 1631 08/24/22 2112 08/25/22 0544  BP: (!) 186/154 (!) 143/92 136/88 (!) 146/89  Pulse:  (!) 122 (!) 120 (!) 113  Resp: (!) 23 15 16 18   Temp:  97.7 F (36.5 C) 98 F (36.7 C) 97.9 F (36.6 C)  TempSrc:  Orl Orl   SpO2:  95% 100% 98%  Weight:  64.1 kg    Height:  5\' 4"  (1.626 m)      Intke/Output Summry (Lst 24 hours) t 08/25/2022 1544 Lst dt filed t 08/25/2022 1237 Gross per 24 hour  Intke 1536.3 ml **Note De-Identified vi Obfusction** Output 600 ml  Net 936.3 ml   Filed Weights   08/24/22 1631  Weight: 64.1 kg    Exmintion:  Generl exm: ppers clm nd comfortble  Respirtory system: unlbored Crdiovsculr system: RRR Gstrointestinl system: bdomen is nondistended, soft nd nontender.  Centrl nervous system: lethrgic, moving ll extremities Extremities: no LEE   Dt Reviewed: I hve personlly reviewed following lbs nd imging studies  CBC: Recent Lbs  Lb 08/24/22 1033 08/24/22 1039 08/25/22 0716  WBC 22.9*  --   26.4*  NEUTROBS 19.7*  --   --   HGB 15.1* 15.0 16.2*  HCT 46.9* 44.0 51.6*  MCV 85.3  --  87.0  PLT PLTELET CLUMPS NOTED ON SMER, COUNT PPERS DEQUTE  --  168    Bsic Metbolic Pnel: Recent Lbs  Lb 08/24/22 1033 08/24/22 1039 08/25/22 0716  N 156* 155* 153*  K 4.1 5.4* 3.8  CL 121*  --  117*  CO2 18*  --  13*  GLUCOSE 134*  --  318*  BUN 68*  --  55*  CRETININE 2.08*  --  1.87*  CLCIUM 9.7  --  9.4    GFR: Estimted Cretinine Clernce: 19 mL/min () (by C-G formul bsed on SCr of 1.87 mg/dL (H)).  Liver Function Tests: Recent Lbs  Lb 08/24/22 1033  ST 32  LT 19  LKPHOS 73  BILITOT 1.9*  PROT 6.6  LBUMIN 3.0*    CBG: Recent Lbs  Lb 08/24/22 1002 08/24/22 1650 08/24/22 2111 08/25/22 0738 08/25/22 1120  GLUCP 120* 161* 225* 296* 327*     Recent Results (from the pst 240 hour(s))  MRS Next Gen by PCR, Nsl     Sttus: None   Collection Time: 08/16/22  2:45 M   Specimen: Nsl Mucos; Nsl Swb  Result Vlue Ref Rnge Sttus   MRS by PCR Next Gen NOT DETECTED NOT DETECTED Finl    Comment: (NOTE) The GeneXpert MRS ssy (FD pproved for NSL specimens only), is one component of  comprehensive MRS coloniztion surveillnce progrm. It is not intended to dignose MRS infection nor to guide or monitor tretment for MRS infections. Test performnce is not FD pproved in ptients less thn 5 yers old. Performed t [facility], [address] [place], [place] [address]   Urine Culture     Sttus: None (Preliminry result)   Collection Time: 08/24/22 10:08 M   Specimen: Urine, Rndom  Result Vlue Ref Rnge Sttus   Specimen Description URINE, RNDOM  Finl   Specil Requests NONE Reflexed from [id number]  Finl   Culture   Finl    CULTURE REINCUBTED FOR BETTER GROWTH Performed t [facility], [address], [place], [place] [address]    Report Sttus PENDING  Incomplete  Culture, blood (routine x  2)     Sttus: None (Preliminry result)   Collection Time: 08/24/22  5:04 PM   Specimen: BLOOD RIGHT HND  Result Vlue Ref Rnge Sttus   Specimen Description BLOOD RIGHT HND  Finl   Specil Requests   Finl    BOTTLES DRWN EROBIC ONLY Blood Culture dequte volume   Culture   Finl    NO GROWTH < 24 HOURS Performed t [facility], [address], [place], [place] [address]    Report Sttus PENDING  Incomplete  Culture, blood (routine x 2)     Sttus: None (Preliminry result)   Collection Time: 08/24/22  8:56 PM   Specimen: BLOOD  Result Value Ref Range Status   Specimen Description BLOOD SITE NOT SPECIFIED  Final   Special Requests   Final    BOTTLES DRAWN AEROBIC AND ANAEROBIC Blood Culture results may not be optimal due to an inadequate volume of blood received in culture bottles   Culture   Final    NO GROWTH < 12 HOURS Performed at [facility], [address], [place], [place] [address]    Report Status PENDING  Incomplete         Radiology Studies: DG Chest Portable 1 View  Result Date: 08/24/2022 CLINICAL DATA:  Provided history: Altered mental status. EXAM: PORTABLE CHEST 1 VIEW COMPARISON:  Prior chest radiographs 08/15/2022 and earlier. Chest CT 05/01/2019. FINDINGS: Heart size within normal limits. Aortic atherosclerosis. Known right lower lobe pulmonary nodule unchanged from the recent prior chest radiograph of 08/15/2022. Subtle ill-defined opacity within the left lung base. No evidence of pleural effusion or pneumothorax. No acute osseous abnormality identified. Spondylosis and dextrocurvature of the thoracic spine. IMPRESSION: 1. Shallow inspiration radiograph. 2. Subtle ill-defined opacity within the left lung base, which may reflect atelectasis or infection. 3. Aortic Atherosclerosis (ICD10-I70.0). Electronically Signed   By: Jackey Loge D.O.   On: 08/24/2022 11:01        Scheduled Meds:  amLODipine  10 mg Oral Daily   aspirin EC  81 mg  Oral Daily   atorvastatin  40 mg Oral Daily   enoxaparin (LOVENOX) injection  30 mg Subcutaneous Q24H   insulin aspart  0-9 Units Subcutaneous Q4H   insulin aspart protamine- aspart  10 Units Subcutaneous BID WC   levothyroxine  125 mcg Oral QAC breakfast   pantoprazole  20 mg Oral Daily   sodium chloride flush  3 mL Intravenous Q12H   Continuous Infusions:  cefTRIAXone (ROCEPHIN)  IV 1 g (08/25/22 0854)   dextrose       LOS: 1 day    Time spent: 40 min critical care time    Lacretia Nicks, MD [facility]   To contact the attending provider between 7A-7P or the covering provider during after hours 7P-7A, please log into the web site [email or web address] and access using universal [place] password for that web site. If you do not have the password, please call the hospital operator.  08/25/2022, 3:44 PM

## 2022-08-25 NOTE — TOC Initial Note (Signed)
Transition of Care [facility]) - Initial/Assessment Note    Patient Details  Name: Allison GUTRIDGE MRN: [id number] Date of Birth: 1934-09-27  Transition of [facility]) CM/SW Contact:    Ralene Bathe, LCSW Phone Number: 08/25/2022, 3:42 PM  Clinical Narrative:                 CSW received consult for possible SNF placement at time of discharge. CSW contacted [facility] social worker Irving Burton [phone]).  [facility] has approved SNF placement for medication management.  LCSW will send referral to SNF that are contracted with [facility].    Patient Goals and CMS Choice            Expected Discharge Plan and Services                                              Prior Living Arrangements/Services                       Activities of Daily Living Home Assistive Devices/Equipment: Wheelchair ADL Screening (condition at time of admission) Patient's cognitive ability adequate to safely complete daily activities?: No Is the patient deaf or have difficulty hearing?: Yes Does the patient have difficulty seeing, even when wearing glasses/contacts?: Yes Does the patient have difficulty concentrating, remembering, or making decisions?: Yes Patient able to express need for assistance with ADLs?: Yes Does the patient have difficulty dressing or bathing?: Yes Independently performs ADLs?: No Does the patient have difficulty walking or climbing stairs?: Yes Weakness of Legs: Both Weakness of Arms/Hands: Both  Permission Sought/Granted                  Emotional Assessment              Admission diagnosis:  Dehydration [E86.0] Encephalopathy [G93.40] AKI (acute kidney injury) (HCC) [N17.9] Patient Active Problem List   Diagnosis Date Noted   AKI (acute kidney injury) (HCC) 08/24/2022   Sepsis secondary to UTI (HCC) 08/24/2022   Acute metabolic encephalopathy 08/24/2022   Diabetic ketoacidosis (HCC) 08/16/2022   DKA, type 2 (HCC) 08/15/2022   Alzheimer's dementia (HCC)  08/15/2022   Hypernatremia 08/15/2022   Family discord 03/28/2019   Neurocognitive deficits 01/04/2018   History of stroke 10/01/2016   Anxiety state 06/03/2016   Visual hallucinations 10/28/2015   CVA (cerebral vascular accident) (HCC) 10/28/2015   Ischemic stroke (HCC)    Hx of adenomatous colonic polyps 06/19/2014   Neuropathic pain 03/27/2014   Uncontrolled type 2 diabetes mellitus with hyperglycemia, without long-term current use of insulin (HCC) 03/27/2014   Bilateral carpal tunnel syndrome 03/27/2014   Dehydration 08/05/2013   Diabetes mellitus with hyperglycemia (HCC) 08/05/2013   Hypercalcemia 08/05/2013   PVD (peripheral vascular disease) (HCC) 03/05/2012   Onychomycosis 03/05/2012   Other hammer toe (acquired) 03/05/2012   Abnormal CXR 12/25/2011   Cystocele 06/11/2011   Incontinence 06/04/2011   Depression 06/04/2011   Hypothyroidism 06/23/2006   Dyslipidemia 06/23/2006   Essential hypertension 06/23/2006   GERD 06/23/2006   IBS 06/23/2006   PCP:  [facility]:   [facility] - [place], [place] - [address] [place] [address] Phone: [phone] Fax: [phone]  [facility] - [place] ([place]), [place] - [address] [place] ([place]) [place]  [address] Phone: [phone] Fax: [phone]     Social Determinants of Health (SDOH) Social History: SDOH Screenings   Food Insecurity: No Food Insecurity (08/24/2022)  Housing: Low Risk  (08/24/2022)  Transportation Needs: No Transportation Needs (08/24/2022)  Utilities: Not At Risk (08/24/2022)  Alcohol Screen: Low Risk  (10/11/2017)  Financial Resource Strain: Low Risk  (06/28/2017)  Physical Activity: Insufficiently Active (06/28/2017)  Social Connections: Somewhat Isolated (06/28/2017)  Stress: Stress Concern Present (06/28/2017)  Tobacco Use: Low Risk  (08/25/2022)   SDOH Interventions:      Readmission Risk Interventions    08/18/2022   12:38 PM  Readmission Risk Prevention Plan  Transportation Screening Complete  PCP or Specialist Appt within 5-7 Days Complete  Home Care Screening Complete  Medication Review (RN CM) Complete

## 2022-08-25 NOTE — Progress Notes (Addendum)
Pharmacy Antibiotic Note  Allison [name] is a 88 y.o. female admitted on 08/24/2022 with sepsis and UTI.  Pharmacy has been consulted for cefepime dosing; s/p 2 doses of ceftriaxone.  She is afebrile, WBC elevated at 26.4, and SCr 1.87 (baseline ~1). Blood cultures are ngtd and urine culture reincubated.   Plan: Cefepime 2 g IV q24h Monitor renal function, clinical progress, cultures/sensitivities F/U LOT and de-escalate as able    Height: 5\' 4"  (162.6 cm) Weight: 64.1 kg (141 lb 5 oz) IBW/kg (Calculated) : 54.7  Temp (24hrs), Avg:97.9 F (36.6 C), Min:97.7 F (36.5 C), Max:98 F (36.7 C)  Recent Labs  Lab 08/24/22 1033 08/24/22 1703 08/24/22 2056 08/25/22 0716  WBC 22.9*  --   --  26.4*  CREATININE 2.08*  --   --  1.87*  LATICACIDVEN  --  2.5* 2.6*  --     Estimated Creatinine Clearance: 19 mL/min (A) (by C-G formula based on SCr of 1.87 mg/dL (H)).    Allergies  Allergen Reactions   Lantus [Insulin Glargine] Itching and Swelling    angioedema   Levemir [Insulin Detemir] Itching and Swelling    angioedema   Pineapple Anaphylaxis    Not listed on the [facility]   Codeine Other (See Comments)    Unknown reaction per [facility]   Invokana [Canagliflozin] Other (See Comments)    Constipation, nightmares Not listed on the [facility]   Penicillins Other (See Comments)    Unknown reaction per [facility]    Sulfa Antibiotics Other (See Comments)    Unknown reaction per [facility]   Tradjenta [Linagliptin] Other (See Comments)    Unknown   Vioxx [Rofecoxib] Other (See Comments)    Unknown reaction per [facility]   Exelon [Rivastigmine] Rash   Thank you for involving pharmacy in this patient's care.  Loura Back, PharmD, BCPS Clinical Pharmacist Clinical phone for 08/25/2022 is [phone] 08/25/2022 4:08 PM

## 2022-08-25 NOTE — Evaluation (Signed)
Physical Therapy Evaluation Patient Details Name: Allison [name] MRN: [id number] DOB: [birth year] Today's Date: 08/25/2022  History of Present Illness  88 y.o. female presents to [facility] hospital on 08/24/2022 with AMS. UA concerning for UTI, pt also with AKI 2/2 dehydration. PMH includes dementia, DMII, hypothyroidism, CVA, HTN, GERD, HLD.  Clinical Impression  Pt presents to PT with deficits in functional mobility, gait, balance, endurance, cognition, initiation. Pt with advanced dementia and demonstrates very inconsistent command following and initiation this session. Pt requires max-totalA of 2 for all functional mobility, with highly inconsistent sitting balance, ranging from total to CGA. Pt is at a high risk for falls and currently dependent on caregiver assist for all mobility and ADLs. PT recommends short term inpatient PT services in an effort to improve mobility quality and reduce falls risk.      If plan is discharge home, recommend the following: Two people to help with walking and/or transfers;Two people to help with bathing/dressing/bathroom;Assistance with cooking/housework;Assistance with feeding;Direct supervision/assist for medications management;Direct supervision/assist for financial management;Assist for transportation;Help with stairs or ramp for entrance;Supervision due to cognitive status   Can travel by private vehicle   No    Equipment Recommendations Hospital bed;Hoyer lift;Wheelchair (measurements PT)  Recommendations for Other Services       Functional Status Assessment Patient has had a recent decline in their functional status and demonstrates the ability to make significant improvements in function in a reasonable and predictable amount of time.     Precautions / Restrictions Precautions Precautions: Fall Precaution Comments: advanced dementia Restrictions Weight Bearing Restrictions: No      Mobility  Bed Mobility Overal bed mobility: Needs  Assistance Bed Mobility: Supine to Sit, Sit to Supine     Supine to sit: Total assist, +2 for physical assistance, HOB elevated Sit to supine: Total assist, +2 for physical assistance, HOB elevated        Transfers Overall transfer level: Needs assistance Equipment used: 2 person hand held assist Transfers: Sit to/from Stand Sit to Stand: Max assist, +2 physical assistance                Ambulation/Gait                  Stairs            Wheelchair Mobility     Tilt Bed    Modified Rankin (Stroke Patients Only)       Balance Overall balance assessment: Needs assistance Sitting-balance support: Single extremity supported, Bilateral upper extremity supported, Feet supported Sitting balance-Leahy Scale: Poor Sitting balance - Comments: maxA-totalA initiall withg Postural control: Posterior lean, Right lateral lean Standing balance support: Bilateral upper extremity supported, Reliant on assistive device for balance Standing balance-Leahy Scale: Poor Standing balance comment: maxA x2                             Pertinent Vitals/Pain Pain Assessment Pain Assessment: PAINAD Breathing: normal Negative Vocalization: none Facial Expression: smiling or inexpressive Body Language: relaxed Consolability: no need to console PAINAD Score: 0    Home Living Family/patient expects to be discharged to:: Skilled nursing facility Living Arrangements: Other relatives;Children Available Help at Discharge: Family;Available 24 hours/day Type of Home: House Home Access: Level entry       Home Layout: One level Home Equipment: Cane - single point;BSC/3in1 Additional Comments: history obtained from previous admission as pt unable to provide accurate information    Prior Function  Prior Level of Function : Needs assist             Mobility Comments: ambulatory in the home with PRN use of cane until contracting COVID in July. Pt was requiring  significant assistance to mobilize since discharge in early august ADLs Comments: assist for ADLs, totalA IADLs     Extremity/Trunk Assessment   Upper Extremity Assessment Upper Extremity Assessment: Defer to OT evaluation    Lower Extremity Assessment Lower Extremity Assessment: Generalized weakness    Cervical / Trunk Assessment Cervical / Trunk Assessment: Kyphotic  Communication   Communication Communication: Difficulty following commands/understanding;Difficulty communicating thoughts/reduced clarity of speech Following commands: Follows one step commands inconsistently;Follows multi-step commands inconsistently Cueing Techniques: Verbal cues;Tactile cues;Gestural cues;Visual cues  Cognition Arousal: Alert Behavior During Therapy: Flat affect Overall Cognitive Status: History of cognitive impairments - at baseline Area of Impairment: Orientation, Attention, Memory, Following commands, Safety/judgement, Awareness, Problem solving                 Orientation Level: Disoriented to, Place, Time, Situation Current Attention Level: Focused Memory: Decreased recall of precautions, Decreased short-term memory Following Commands: Follows one step commands inconsistently Safety/Judgement: Decreased awareness of safety, Decreased awareness of deficits Awareness: Intellectual Problem Solving: Slow processing, Decreased initiation          General Comments General comments (skin integrity, edema, etc.): tachy into 110s with activity    Exercises     Assessment/Plan    PT Assessment Patient needs continued PT services  PT Problem List Decreased strength;Decreased activity tolerance;Decreased balance;Decreased mobility;Decreased cognition;Decreased knowledge of use of DME;Decreased safety awareness;Decreased knowledge of precautions       PT Treatment Interventions DME instruction;Gait training;Functional mobility training;Therapeutic activities;Therapeutic  exercise;Balance training;Neuromuscular re-education;Cognitive remediation;Patient/family education;Wheelchair mobility training    PT Goals (Current goals can be found in the Care Plan section)  Acute Rehab PT Goals Patient Stated Goal: pt unable to state, PT goal to improve bed mobility quality and reduce falls risk during transfers PT Goal Formulation: Patient unable to participate in goal setting Time For Goal Achievement: 09/08/22 Potential to Achieve Goals: Poor    Frequency Min 1X/week     Co-evaluation               AM-PAC PT "6 Clicks" Mobility  Outcome Measure Help needed turning from your back to your side while in a flat bed without using bedrails?: Total Help needed moving from lying on your back to sitting on the side of a flat bed without using bedrails?: Total Help needed moving to and from a bed to a chair (including a wheelchair)?: Total Help needed standing up from a chair using your arms (e.g., wheelchair or bedside chair)?: Total Help needed to walk in hospital room?: Total Help needed climbing 3-5 steps with a railing? : Total 6 Click Score: 6    End of Session Equipment Utilized During Treatment: Gait belt Activity Tolerance: Patient tolerated treatment well Patient left: in bed;with call bell/phone within reach;with bed alarm set Nurse Communication: Mobility status;Need for lift equipment PT Visit Diagnosis: Other abnormalities of gait and mobility (R26.89);Muscle weakness (generalized) (M62.81)    Time: 6578-4696 PT Time Calculation (min) (ACUTE ONLY): 17 min   Charges:   PT Evaluation $PT Eval Low Complexity: 1 Low   PT General Charges $$ ACUTE PT VISIT: 1 Visit         Arlyss Gandy, PT, DPT Acute Rehabilitation Office [phone]   Arlyss Gandy 08/25/2022, 10:55 AM

## 2022-08-26 DIAGNOSIS — G308 Other Alzheimer's disease: Secondary | ICD-10-CM | POA: Diagnosis not present

## 2022-08-26 DIAGNOSIS — N390 Urinary tract infection, site not specified: Secondary | ICD-10-CM | POA: Diagnosis not present

## 2022-08-26 DIAGNOSIS — E860 Dehydration: Secondary | ICD-10-CM | POA: Diagnosis not present

## 2022-08-26 DIAGNOSIS — Z7189 Other specified counseling: Secondary | ICD-10-CM

## 2022-08-26 DIAGNOSIS — R627 Adult failure to thrive: Secondary | ICD-10-CM

## 2022-08-26 DIAGNOSIS — Z515 Encounter for palliative care: Secondary | ICD-10-CM

## 2022-08-26 DIAGNOSIS — A419 Sepsis, unspecified organism: Secondary | ICD-10-CM | POA: Diagnosis not present

## 2022-08-26 DIAGNOSIS — N179 Acute kidney failure, unspecified: Secondary | ICD-10-CM | POA: Diagnosis not present

## 2022-08-26 LAB — BASIC METABOLIC PANEL
Anion gap: 13 (ref 5–15)
BUN: 37 mg/dL — ABNORMAL HIGH (ref 8–23)
CO2: 22 mmol/L (ref 22–32)
Calcium: 9.5 mg/dL (ref 8.9–10.3)
Chloride: 121 mmol/L — ABNORMAL HIGH (ref 98–111)
Creatinine, Ser: 1.6 mg/dL — ABNORMAL HIGH (ref 0.44–1.00)
GFR, Estimated: 31 mL/min — ABNORMAL LOW (ref >60)
Glucose, Bld: 347 mg/dL — ABNORMAL HIGH (ref 70–99)
Potassium: 2.8 mmol/L — ABNORMAL LOW (ref 3.5–5.1)
Sodium: 156 mmol/L — ABNORMAL HIGH (ref 135–145)

## 2022-08-26 LAB — GLUCOSE, CAPILLARY
Glucose-Capillary: 274 mg/dL — ABNORMAL HIGH (ref 70–99)
Glucose-Capillary: 291 mg/dL — ABNORMAL HIGH (ref 70–99)
Glucose-Capillary: 307 mg/dL — ABNORMAL HIGH (ref 70–99)
Glucose-Capillary: 246 mg/dL — ABNORMAL HIGH (ref 70–99)
Glucose-Capillary: 142 mg/dL — ABNORMAL HIGH (ref 70–99)
Glucose-Capillary: 184 mg/dL — ABNORMAL HIGH (ref 70–99)
Glucose-Capillary: 223 mg/dL — ABNORMAL HIGH (ref 70–99)

## 2022-08-26 LAB — MAGNESIUM: Magnesium: 2 mg/dL (ref 1.7–2.4)

## 2022-08-26 LAB — PHOSPHORUS: Phosphorus: 1.4 mg/dL — ABNORMAL LOW (ref 2.5–4.6)

## 2022-08-26 MED ORDER — POTASSIUM PHOSPHATES 15 MMOLE/5ML IV SOLN
45.0000 mmol | Freq: Once | INTRAVENOUS | Status: AC
  Administered 2022-08-26: 45 mmol via INTRAVENOUS
  Filled 2022-08-26: qty 15

## 2022-08-26 MED ORDER — INSULIN NPH (HUMAN) (ISOPHANE) 100 UNIT/ML ~~LOC~~ SUSP
10.0000 [IU] | Freq: Two times a day (BID) | SUBCUTANEOUS | Status: DC
  Administered 2022-08-26 – 2022-08-31 (×10): 10 [IU] via SUBCUTANEOUS
  Filled 2022-08-26: qty 10

## 2022-08-26 MED ORDER — GLUCERNA SHAKE PO LIQD
237.0000 mL | Freq: Three times a day (TID) | ORAL | Status: DC
  Administered 2022-08-27 – 2022-08-31 (×11): 237 mL via ORAL

## 2022-08-26 NOTE — TOC Progression Note (Signed)
Transition of Care [facility]) - Initial/Assessment Note    Patient Details  Name: Allison [name] MRN: [id number] Date of Birth: Oct 04, 1934  Transition of Care [facility]) CM/SW Contact:    Ralene Bathe, LCSW Phone Number: 08/26/2022, 10:28 AM  Clinical Narrative:                 LCSW contacted the patient's son, Tinnie Gens.  The family is in agreement with SNF placement.      Patient Goals and CMS Choice            Expected Discharge Plan and Services                                              Prior Living Arrangements/Services                       Activities of Daily Living Home Assistive Devices/Equipment: Wheelchair ADL Screening (condition at time of admission) Patient's cognitive ability adequate to safely complete daily activities?: No Is the patient deaf or have difficulty hearing?: Yes Does the patient have difficulty seeing, even when wearing glasses/contacts?: Yes Does the patient have difficulty concentrating, remembering, or making decisions?: Yes Patient able to express need for assistance with ADLs?: Yes Does the patient have difficulty dressing or bathing?: Yes Independently performs ADLs?: No Does the patient have difficulty walking or climbing stairs?: Yes Weakness of Legs: Both Weakness of Arms/Hands: Both  Permission Sought/Granted                  Emotional Assessment              Admission diagnosis:  Dehydration [E86.0] Encephalopathy [G93.40] AKI (acute kidney injury) (HCC) [N17.9] Patient Active Problem List   Diagnosis Date Noted   AKI (acute kidney injury) (HCC) 08/24/2022   Sepsis secondary to UTI (HCC) 08/24/2022   Acute metabolic encephalopathy 08/24/2022   Diabetic ketoacidosis (HCC) 08/16/2022   DKA, type 2 (HCC) 08/15/2022   Alzheimer's dementia (HCC) 08/15/2022   Hypernatremia 08/15/2022   Family discord 03/28/2019   Neurocognitive deficits 01/04/2018   History of stroke 10/01/2016   Anxiety  state 06/03/2016   Visual hallucinations 10/28/2015   CVA (cerebral vascular accident) (HCC) 10/28/2015   Ischemic stroke (HCC)    Hx of adenomatous colonic polyps 06/19/2014   Neuropathic pain 03/27/2014   Uncontrolled type 2 diabetes mellitus with hyperglycemia, without long-term current use of insulin (HCC) 03/27/2014   Bilateral carpal tunnel syndrome 03/27/2014   Dehydration 08/05/2013   Diabetes mellitus with hyperglycemia (HCC) 08/05/2013   Hypercalcemia 08/05/2013   PVD (peripheral vascular disease) (HCC) 03/05/2012   Onychomycosis 03/05/2012   Other hammer toe (acquired) 03/05/2012   Abnormal CXR 12/25/2011   Cystocele 06/11/2011   Incontinence 06/04/2011   Depression 06/04/2011   Hypothyroidism 06/23/2006   Dyslipidemia 06/23/2006   Essential hypertension 06/23/2006   GERD 06/23/2006   IBS 06/23/2006   PCP:  Inc, [facility]:   [facility] - [place], [place] - [address] [place] [address] Phone: [phone] Fax: [phone]  [facility] [address] [place]), [place] - [address] [place] [address] Phone: [phone] Fax: [phone]     Social Determinants of Health (SDOH) Social History: SDOH Screenings   Food Insecurity:  No Food Insecurity (08/24/2022)  Housing: Low Risk  (08/24/2022)  Transportation Needs: No Transportation Needs (08/24/2022)  Utilities: Not At Risk (08/24/2022)  Alcohol Screen: Low Risk  (10/11/2017)  Financial Resource Strain: Low Risk  (06/28/2017)  Physical Activity: Insufficiently Active (06/28/2017)  Social Connections: Somewhat Isolated (06/28/2017)  Stress: Stress Concern Present (06/28/2017)  Tobacco Use: Low Risk  (08/25/2022)   SDOH Interventions:     Readmission Risk Interventions    08/18/2022   12:38 PM  Readmission Risk Prevention Plan  Transportation Screening Complete  PCP or Specialist Appt within  5-7 Days Complete  Home Care Screening Complete  Medication Review (RN CM) Complete

## 2022-08-26 NOTE — Hospital Course (Signed)
Allison [name] is an 88 yo female with PMH HTN, HLD, CVA, DM II, hypothyroidism, dementia who presented with altered mentation.  Workup was concerning for UTI and hypernatremia; she was started on antibiotics and IVF.

## 2022-08-26 NOTE — [place] FL2 (Signed)
[place] MEDICAID FL2 LEVEL OF CARE FORM     IDENTIFICATION  Patient Name: Allison [name] Birthdate: September 18, 1934 Sex: female Admission Date (Current Location): 08/24/2022  [facility] and [place] Number:  [employer] television[employer]film[employer]video and Address:  [facility], [address] [place], [place] [address]      Provider Number: [id number]  Attending Physician Name and Address:  Lewie Chamber, MD  Relative Name and Phone Number:  Vonciel, Kosub [phone])  [phone]    Current Level of Care: Hospital Recommended Level of Care: Skilled Nursing Facility Prior Approval Number:    Date Approved/Denied:   PASRR Number: [id number]  Discharge Plan: SNF    Current Diagnoses: Patient Active Problem List   Diagnosis Date Noted   AKI (acute kidney injury) (HCC) 08/24/2022   Sepsis secondary to UTI (HCC) 08/24/2022   Acute metabolic encephalopathy 08/24/2022   Diabetic ketoacidosis (HCC) 08/16/2022   DKA, type 2 (HCC) 08/15/2022   Alzheimer's dementia (HCC) 08/15/2022   Hypernatremia 08/15/2022   Family discord 03/28/2019   Neurocognitive deficits 01/04/2018   History of stroke 10/01/2016   Anxiety state 06/03/2016   Visual hallucinations 10/28/2015   CVA (cerebral vascular accident) (HCC) 10/28/2015   Ischemic stroke (HCC)    Hx of adenomatous colonic polyps 06/19/2014   Neuropathic pain 03/27/2014   Uncontrolled type 2 diabetes mellitus with hyperglycemia, without long-term current use of insulin (HCC) 03/27/2014   Bilateral carpal tunnel syndrome 03/27/2014   Dehydration 08/05/2013   Diabetes mellitus with hyperglycemia (HCC) 08/05/2013   Hypercalcemia 08/05/2013   PVD (peripheral vascular disease) (HCC) 03/05/2012   Onychomycosis 03/05/2012   Other hammer toe (acquired) 03/05/2012   Abnormal CXR 12/25/2011   Cystocele 06/11/2011   Incontinence 06/04/2011   Depression 06/04/2011   Hypothyroidism 06/23/2006   Dyslipidemia 06/23/2006   Essential  hypertension 06/23/2006   GERD 06/23/2006   IBS 06/23/2006    Orientation RESPIRATION BLADDER Height & Weight     Self  Normal Incontinent Weight: 141 lb 5 oz (64.1 kg) Height:  5\' 4"  (162.6 cm)  BEHAVIORAL SYMPTOMS/MOOD NEUROLOGICAL BOWEL NUTRITION STATUS      Incontinent Diet (see d/c summary)  AMBULATORY STATUS COMMUNICATION OF NEEDS Skin   Total Care Verbally Normal                       Personal Care Assistance Level of Assistance  Bathing, Feeding, Dressing Bathing Assistance: Maximum assistance Feeding assistance: Maximum assistance       Functional Limitations Info  Sight, Hearing, Speech Sight Info: Impaired Hearing Info: Impaired Speech Info: Adequate    SPECIAL CARE FACTORS FREQUENCY  PT (By licensed PT), OT (By licensed OT)     PT Frequency: 5x/ week OT Frequency: 5x/ week            Contractures Contractures Info: Not present    Additional Factors Info  Insulin Sliding Scale, Allergies, Code Status Code Status Info: FULL Allergies Info: Lantus (Insulin Glargine)  Levemir (Insulin Detemir)  Pineapple  Codeine  Invokana (Canagliflozin)  Penicillins  Sulfa Antibiotics  Tradjenta (Linagliptin)  Vioxx (Rofecoxib)  Exelon (Rivastigmine)   Insulin Sliding Scale Info: see d/c med list       Current Medications (08/26/2022):  This is the current hospital active medication list Current Facility-Administered Medications  Medication Dose Route Frequency Provider Last Rate Last Admin   acetaminophen (TYLENOL) tablet 650 mg  650 mg Oral Q6H PRN Clydie Braun, MD  Or   acetaminophen (TYLENOL) suppository 650 mg  650 mg Rectal Q6H PRN Madelyn Flavors A, MD       albuterol (PROVENTIL) (2.5 MG/3ML) 0.083% nebulizer solution 2.5 mg  2.5 mg Nebulization Q6H PRN Katrinka Blazing, Rondell A, MD       amLODipine (NORVASC) tablet 10 mg  10 mg Oral Daily Madelyn Flavors A, MD       aspirin EC tablet 81 mg  81 mg Oral Daily Smith, Rondell A, MD       atorvastatin  (LIPITOR) tablet 40 mg  40 mg Oral Daily Smith, Rondell A, MD   40 mg at 08/25/22 1237   ceFEPIme (MAXIPIME) 2 g in sodium chloride 0.9 % 100 mL IVPB  2 g Intravenous Q24H [place], Lynita Lombard, RPH       dextrose 5 % solution   Intravenous Continuous Zigmund Daniel., MD 100 mL/hr at 08/26/22 1040 New Bag at 08/26/22 1040   diclofenac Sodium (VOLTAREN) 1 % topical gel 2 g  2 g Topical Daily PRN Madelyn Flavors A, MD       enoxaparin (LOVENOX) injection 30 mg  30 mg Subcutaneous Q24H Smith, Rondell A, MD   30 mg at 08/25/22 1925   insulin aspart (novoLOG) injection 0-9 Units  0-9 Units Subcutaneous Q4H Zigmund Daniel., MD   3 Units at 08/26/22 1226   insulin NPH Human (NOVOLIN N) injection 10 Units  10 Units Subcutaneous BID AC & HS Lewie Chamber, MD       levothyroxine (SYNTHROID) tablet 125 mcg  125 mcg Oral QAC breakfast Smith, Rondell A, MD       pantoprazole (PROTONIX) EC tablet 20 mg  20 mg Oral Daily Smith, Rondell A, MD   20 mg at 08/25/22 1238   potassium PHOSPHATE 45 mmol in dextrose 5 % 500 mL infusion  45 mmol Intravenous Once Lewie Chamber, MD 64.4 mL/hr at 08/26/22 1230 45 mmol at 08/26/22 1230   sodium chloride flush (NS) 0.9 % injection 3 mL  3 mL Intravenous Q12H Smith, Rondell A, MD   3 mL at 08/26/22 0959   vancomycin (VANCOREADY) IVPB 500 mg/100 mL  500 mg Intravenous Q24H Stevphen [place], [facility]         Discharge Medications: Please see discharge summary for a list of discharge medications.  Relevant Imaging Results:  Relevant Lab Results:   Additional Information SSN: [id number]  Catalina Pizza , LCSW

## 2022-08-26 NOTE — Progress Notes (Addendum)
Initial Nutrition Assessment  DOCUMENTATION CODES:   Non-severe (moderate) malnutrition in context of chronic illness  INTERVENTION:  Trial Glucerna Shake po TID, each supplement provides 220 kcal and 10 grams of protein  Encourage po intake  Offer patient assistance with ordering meals and feeing if necessary   If patient remains in this state and continues not to eat or drink, RD recommends  a SBFT for temporary artificial nutrition   NUTRITION DIAGNOSIS:   Moderate Malnutrition related to chronic illness as evidenced by severe fat depletion, moderate fat depletion, severe muscle depletion, moderate muscle depletion.   GOAL:   Patient will meet greater than or equal to 90% of their needs   MONITOR:   PO intake, Supplement acceptance, Weight trends, Skin, I & O's  REASON FOR ASSESSMENT:   Malnutrition Screening Tool    ASSESSMENT:   88 y.o. female with PMHx including HTN, HLD, CVA with residual deficits, T2DM, hypothyroidism, and dementia presents with AMS  Visited patient at bedside who appears catatonic. Her eyes were open, she did not  track movement, and did not say anything besides "no" when RD asked if she was hungry. RD attempted to ask other questions, but patient did not respond.  Chart reviewed to get more history:   Recently admitted from 8/3-8/6 with AMS due to DKA with AKI and hypernatremia. After she was dc'd, she never really recovered.  Patient was not eating, drinking or moving   Labs: Glu 246, Na 156, K+ 2.8, A1c 11.5 Meds: aspirin, lipitor, insulin, protonix, Kphos, NS, vancoready, maxipime, rocephin, D5 @100  ml/hr   Wt: stable?  08/24/22 64.1 kg  08/16/22 64.1 kg  11/09/21 54 kg  01/07/21 54.9 kg  03/27/19 62 kg    PO: 0-25% x last 6 documented meals   NUTRITION - FOCUSED PHYSICAL EXAM:  Flowsheet Row Most Recent Value  Orbital Region Severe depletion  Upper Arm Region Moderate depletion  Thoracic and Lumbar Region Unable to assess   Buccal Region Severe depletion  Temple Region Moderate depletion  Clavicle Bone Region Severe depletion  Clavicle and Acromion Bone Region Severe depletion  Scapular Bone Region Unable to assess  Dorsal Hand Moderate depletion  Patellar Region Moderate depletion  Anterior Thigh Region Moderate depletion  Posterior Calf Region Moderate depletion  Edema (RD Assessment) None  Hair Reviewed  Eyes Reviewed  Mouth Unable to assess  Skin Reviewed  Nails Reviewed       Diet Order:   Diet Order             DIET - DYS 1 Room service appropriate? No; Fluid consistency: Thin  Diet effective now                   EDUCATION NEEDS:   Education needs have been addressed  Skin:  Skin Assessment: Skin Integrity Issues: Skin Integrity Issues:: DTI DTI: coccyx  Last BM:  PTA  Height:   Ht Readings from Last 1 Encounters:  08/24/22 5\' 4"  (1.626 m)    Weight:   Wt Readings from Last 1 Encounters:  08/24/22 64.1 kg    Ideal Body Weight:     BMI:  Body mass index is 24.26 kg/m.  Estimated Nutritional Needs:   Kcal:  1600-1920  Protein:  75-100 g  Fluid:  > 1.6L   Leodis Rains, RDN, LDN  Clinical Nutrition

## 2022-08-26 NOTE — Evaluation (Signed)
Clinical/Bedside Swallow Evaluation Patient Details  Name: RAFAEL MCDILL MRN: [id number] Date of Birth: 10-14-1934  Today's Date: 08/26/2022 Time: SLP Start Time (ACUTE ONLY): 0950 SLP Stop Time (ACUTE ONLY): 1005 SLP Time Calculation (min) (ACUTE ONLY): 15 min  Past Medical History:  Past Medical History:  Diagnosis Date   Acute ischemic stroke (HCC) 10/29/2015   Carpal tunnel syndrome    Dementia (HCC)    Diabetes mellitus without complication (HCC)    Dysphagia    GERD (gastroesophageal reflux disease)    Hiatal hernia    HLD (hyperlipidemia)    Hx of adenomatous colonic polyps 06/19/2014   Hyperlipidemia    Hypertension    Hypothyroidism    Onychomycosis 03/05/2012   Pain in lower limb 04/18/2013   Sciatic nerve pain    Stroke-like symptoms 10/28/2015   Vertigo    Past Surgical History:  Past Surgical History:  Procedure Laterality Date   BLADDER SURGERY     Mesh implant   COLONOSCOPY     LARYNX SURGERY     vocal cords   PARTIAL HYSTERECTOMY  1978   VESICOVAGINAL FISTULA CLOSURE W/ TAH  1976   HPI:  88 y.o. female presents to [facility] hospital on 08/24/2022 with AMS. UA concerning for UTI, pt also with AKI 2/2 dehydration. PMH includes dementia, DMII, hypothyroidism, CVA, HTN, GERD, HLD. CXR, 08/24/22, "1. Shallow inspiration radiograph.  2. Subtle ill-defined opacity within the left lung base, which may  reflect atelectasis or infection.  3. Aortic Atherosclerosis (ICD10-I70.0)."    Assessment / Plan / Recommendation  Clinical Impression  Pt seen for clinical swallowing evaluation. Pt presents with s/sx moderate oral dysphagia c/b oral holding and delayed oral transit of purees, mild lingual residual with puree which cleared with liquid wash, oral holding and limited-to-no attempt of A-P transit with solid (removed from oral cavity by SLP), and anterior loss of liquids x1 on L with straw sip of thin liquids. Oral deficits likely secondary to dental status and exacerbated  by mental status/cognitive deficits. Concern for pharyngeal dysphagia given cough with rapid, consecutive sips of thin liquids via straw and spontaneous secondary swallows with thin liquid and puree trials. Pt benefited from pinched straw to limit rate/volume of liquid intake.  Recommend diet downgrade to puree and thin with safe swallowing strategies/aspiration precautions as outlined below. Concern for pt's ability to meet nutritional needs due to reduced PO intake and altered consistency diet in setting of dementia. Pt may benefit from Palliative Care consult and RD consult. SLP to f/u per POC for diet tolerance and trials of upgraded textures.  SLP Visit Diagnosis: Dysphagia, oropharyngeal phase (R13.12)    Aspiration Risk  Mild aspiration risk;Risk for inadequate nutrition/hydration    Diet Recommendation Dysphagia 1 (Puree);Thin liquid    Liquid Administration via: Spoon;Cup;Straw (pinched straw) Medication Administration: Crushed with puree Supervision: Staff to assist with self feeding;Full supervision/cueing for compensatory strategies Compensations: Minimize environmental distractions;Slow rate;Small sips/bites (pinch straw to limited rate/volume of liquid intake) Postural Changes: Seated upright at 90 degrees;Remain upright for at least 30 minutes after po intake    Other  Recommendations Oral Care Recommendations: Oral care QID;Staff/trained caregiver to provide oral care    Recommendations for follow up therapy are one component of a multi-disciplinary discharge planning process, led by the attending physician.  Recommendations may be updated based on patient status, additional functional criteria and insurance authorization.  Follow up Recommendations Follow physician's recommendations for discharge plan and follow up therapies  Functional Status Assessment Patient has had a recent decline in their functional status and demonstrates the ability to make significant  improvements in function in a reasonable and predictable amount of time.  Frequency and Duration min 2x/week  2 weeks       Prognosis Prognosis for improved oropharyngeal function: Fair Barriers to Reach Goals: Cognitive deficits;Severity of deficits      Swallow Study   General Date of Onset: 08/24/22 HPI: 88 y.o. female presents to [facility] hospital on 08/24/2022 with AMS. UA concerning for UTI, pt also with AKI 2/2 dehydration. PMH includes dementia, DMII, hypothyroidism, CVA, HTN, GERD, HLD. CXR, 08/24/22, "1. Shallow inspiration radiograph.  2. Subtle ill-defined opacity within the left lung base, which may  reflect atelectasis or infection.  3. Aortic Atherosclerosis (ICD10-I70.0)." Type of Study: Bedside Swallow Evaluation Previous Swallow Assessment: none Diet Prior to this Study: Regular;Thin liquids (Level 0) Temperature Spikes Noted: No Respiratory Status: Room air History of Recent Intubation: No Behavior/Cognition: Alert;Confused Oral Cavity Assessment: Within Functional Limits Oral Care Completed by SLP: Yes Oral Cavity - Dentition: Edentulous (upper denture present; pt declined placement) Vision: Functional for self-feeding Self-Feeding Abilities: Total assist Patient Positioning: Upright in bed Baseline Vocal Quality: Normal Volitional Cough: Cognitively unable to elicit Volitional Swallow: Unable to elicit    Oral/Motor/Sensory Function Overall Oral Motor/Sensory Function:  (pt unable to follow commands for participation)   [place] chips: Not tested   Thin Liquid Thin Liquid: Impaired Presentation: Cup;Straw Oral Phase Impairments: Reduced labial seal Oral Phase Functional Implications: Left anterior spillage Pharyngeal  Phase Impairments: Cough - Immediate (spontaneous secondary swallows) Other Comments: cough with rapid, consecutive sips of thin liquids via straw    Nectar Thick Nectar Thick Liquid: Not tested   Honey Thick Honey Thick Liquid: Not tested    Puree Puree: Impaired Presentation: Spoon Oral Phase Impairments: Poor awareness of bolus Oral Phase Functional Implications: Oral holding;Prolonged oral transit;Oral residue Pharyngeal Phase Impairments:  (WFL)   Solid     Solid: Impaired Presentation: Spoon Oral Phase Impairments: Poor awareness of bolus Oral Phase Functional Implications: Oral holding (limited to no attempt at mastication or bolus manipulation; removed from oral cavity by SLP) Pharyngeal Phase Impairments:  (UTA)     Clyde Canterbury, M.S., CCC-SLP Speech-Language Pathologist Secure Chat Preferred  O: [phone]  Alessandra Bevels  08/26/2022,11:23 AM

## 2022-08-26 NOTE — Consult Note (Signed)
WOC Nurse Consult Note: Reason for Consult: DTPI buttocks  Wound type: Deep Tissue Pressure Injury; over the sacrum/upper buttocks Pressure Injury POA: Yes Measurement: 10cm x 6cm x 0cm  Wound bed: dark purple non blanchable tissue, small area of drained  blister on the upper right buttock Drainage (amount, consistency, odor) scant from ruptured blister Periwound: intact  Dressing procedure/placement/frequency: Continue silicone foam Added low air loss mattress for moisture management and pressure redistribution.   Discussed POC with patient and bedside nurse.  Re consult if needed, will not follow at this time. Thanks   [employer], RN,CWOCN, CNS, CWON-AP [phone])

## 2022-08-26 NOTE — Consult Note (Cosign Needed Addendum)
Consultation Note Date: 08/26/2022   Patient Name: Allison [name]  DOB: Apr 05, 1934  MRN: [id number]  Age / Sex: 88 y.o., female  PCP: [facility] Referring Physician: Lewie Chamber, MD  Reason for Consultation: Establishing goals of care  HPI/Patient Profile: 88 y.o. female   admitted on 08/24/2022 with past medical history significant of hypertension, hyperlipidemia, CVA without residual deficits, diabetes mellitus type 2, hypothyroidism, and dementia who present for altered mental status.    She had just recently been hospitalized 8/3-8/6 with altered mental status due to DKA with acute kidney injury and hypernatremia.  According to the HPI admission note, patient has continued to decline physically functionally and cognitively   Patient lives at home with her sister who is in her 42s, but other family members had been coming in to try and help as well.  They were trying to push fluids, but patient was not wanting to eat or drink.  They have been giving her medicines.  She had not wanted to get up and family members were having to pick her up and move her to different places.    Patient has had several admissions and ER visits in the past 6 months  Patient does not have medical decision-making capacity.  Family face treatment option decisions, advanced directive decisions and anticipatory care needs.   Clinical Assessment and Goals of Care:  This NP Allison [name] reviewed medical records, received report from team, assessed the patient and then met at the bedside with  son/  Allison [name] to discuss diagnosis, prognosis, GOC, EOL wishes disposition and options.  ( I was unable to leave a message multiple times today for son/ Allison [name]/brother tells me he is HP OA)   Concept of Palliative Care was introduced as specialized medical care for people and their families  living with serious illness.  If focuses on providing relief from the symptoms and stress of a serious illness.  The goal is to improve quality of life for both the patient and the family.  Values and goals of care important to patient and family were attempted to be elicited.  [name]/son reports continued physical, functional and cognitive decline over the past several months.  Continued poor oral intake.  Multiple ER visits.  Education offered on the concept of adult failure to thrive   A  discussion was had today regarding advanced directives.  Concepts specific to code status, artifical feeding and hydration, continued IV antibiotics and rehospitalization was had.    The difference between a aggressive medical intervention path  and a palliative comfort care path for this patient at this time was had.   Education offered on hospice benefit; philosophy and eligibility    MOST form introduced and left for review   Natural trajectory and expectations at EOL were discussed.  Questions and concerns addressed.  Patient  encouraged to call with questions or concerns.     PMT will continue to support holistically.   Son at bedside tells me he  will try to speak to his brother tonight in an attempt to clarify and document end-of-life wishes for Allison [name].     According to Allison [name], his brother  Allison [name] is actually documented H POA.  We do not have documents to support this.  He is going to ask his brother to provide those documents for scanning        SUMMARY OF RECOMMENDATIONS    Code Status/Advance Care Planning: Full code Educated family to consider DNR/DNI status understanding evidenced based poor outcomes in similar hospitalized patients.  Brother at bedside verbalizes understanding and will discuss with H POA.    Palliative Prophylaxis:  Aspiration, Bowel Regimen, Delirium Protocol, Frequent Pain Assessment, and Oral Care    Psycho-social/Spiritual:  Desire for  further Chaplaincy support:no Additional Recommendations: Education on Hospice  Prognosis:  Unable to determine  Discharge Planning: Skilled Nursing Facility with Hospice  I discussed with brother at bedside that likely patient is hospice eligible on discharge.  He will again discuss this with his brother who is  H POA       Primary Diagnoses: Present on Admission:  AKI (acute kidney injury) (HCC)  Sepsis secondary to UTI (HCC)  Dehydration  Hypernatremia  Dyslipidemia  Hypothyroidism  Essential hypertension  GERD  Alzheimer's dementia (HCC)  Acute metabolic encephalopathy  Uncontrolled type 2 diabetes mellitus with hyperglycemia, without long-term current use of insulin (HCC)   I have reviewed the medical record, interviewed the patient and family, and examined the patient. The following aspects are pertinent.  Past Medical History:  Diagnosis Date   Acute ischemic stroke (HCC) 10/29/2015   Carpal tunnel syndrome    Dementia (HCC)    Diabetes mellitus without complication (HCC)    Dysphagia    GERD (gastroesophageal reflux disease)    Hiatal hernia    HLD (hyperlipidemia)    Hx of adenomatous colonic polyps 06/19/2014   Hyperlipidemia    Hypertension    Hypothyroidism    Onychomycosis 03/05/2012   Pain in lower limb 04/18/2013   Sciatic nerve pain    Stroke-like symptoms 10/28/2015   Vertigo    Social History   Socioeconomic History   Marital status: Single    Spouse name: Not on file   Number of children: 2   Years of education: Not on file   Highest education level: Not on file  Occupational History   Occupation: Retired  Tobacco Use   Smoking status: Never   Smokeless tobacco: Never  Vaping Use   Vaping status: Never Used  Substance and Sexual Activity   Alcohol use: No    Alcohol/week: 0.0 standard drinks of alcohol   Drug use: No   Sexual activity: Not on file  Other Topics Concern   Not on file  Social History Narrative   Single   2  sons   Retired   2 cups caffeine daily   05/01/2014   Social Determinants of Health   Financial Resource Strain: Low Risk  (06/28/2017)   Overall Financial Resource Strain (CARDIA)    Difficulty of Paying Living Expenses: Not hard at all  Food Insecurity: No Food Insecurity (08/24/2022)   Hunger Vital Sign    Worried About Running Out of Food in the Last Year: Never true    Ran Out of Food in the Last Year: Never true  Transportation Needs: No Transportation Needs (08/24/2022)   PRAPARE - Administrator, Civil Service (Medical): No    Lack of Transportation (  Non-Medical): No  Physical Activity: Insufficiently Active (06/28/2017)   Exercise Vital Sign    Days of Exercise per Week: 2 days    Minutes of Exercise per Session: 20 min  Stress: Stress Concern Present (06/28/2017)   [employer] of Occupational Health - Occupational Stress Questionnaire    Feeling of Stress : Rather much  Social Connections: Somewhat Isolated (06/28/2017)   Social Connection and Isolation Panel [NHANES]    Frequency of Communication with Friends and Family: More than three times a week    Frequency of Social Gatherings with Friends and Family: More than three times a week    Attends Religious Services: More than 4 times per year    Active Member of [employer] or Organizations: No    Attends Engineer, structural: Never    Marital Status: Never married   Family History  Problem Relation Age of Onset   Alzheimer's disease Mother    Glaucoma Mother    Heart disease Father    Alzheimer's disease Father    Asthma Sister    Allergies Sister    Allergies Sister    Sleep apnea Sister    Heart disease Sister    Breast cancer Maternal Aunt    Pancreatic cancer Maternal Aunt    Prostate cancer Maternal Uncle    Prostate cancer Paternal Uncle    Alcoholism Maternal Uncle    Kidney disease Maternal Aunt    Heart disease Maternal Aunt    Scheduled Meds:  amLODipine  10 mg Oral Daily    aspirin EC  81 mg Oral Daily   atorvastatin  40 mg Oral Daily   enoxaparin (LOVENOX) injection  30 mg Subcutaneous Q24H   insulin aspart  0-9 Units Subcutaneous Q4H   insulin NPH Human  7 Units Subcutaneous BID AC & HS   levothyroxine  125 mcg Oral QAC breakfast   pantoprazole  20 mg Oral Daily   sodium chloride flush  3 mL Intravenous Q12H   Continuous Infusions:  ceFEPime (MAXIPIME) IV     dextrose 100 mL/hr at 08/26/22 1040   vancomycin     PRN Meds:.acetaminophen **OR** acetaminophen, albuterol, diclofenac Sodium Medications Prior to Admission:  Prior to Admission medications   Medication Sig Start Date End Date Taking? Authorizing Provider  acetaminophen (TYLENOL) 500 MG tablet Take 500 mg by mouth daily as needed for mild pain.    [provider]  amLODipine (NORVASC) 10 MG tablet Take 1 tablet (10 mg total) by mouth daily. 08/18/22   Almon Hercules, MD  aspirin EC 81 MG tablet Take 1 tablet (81 mg total) by mouth daily. Swallow whole. 08/18/22 08/18/23  Almon Hercules, MD  atorvastatin (LIPITOR) 40 MG tablet Take 1 tablet (40 mg total) by mouth daily. 08/18/22   Almon Hercules, MD  Blood Glucose Monitoring Suppl DEVI 1 each by Does not apply route in the morning, at noon, and at bedtime. May substitute to any manufacturer covered by patient's insurance. 08/18/22   Almon Hercules, MD  calcium carbonate (TUMS EX) 750 MG chewable tablet Chew 2 tablets by mouth daily as needed (indigestion).    [provider]  diclofenac Sodium (VOLTAREN ARTHRITIS PAIN) 1 % GEL Apply 2 g topically daily as needed (for pain).    [provider]  Glucose Blood (BLOOD GLUCOSE TEST STRIPS) STRP 1 each by In Vitro route 3 (three) times daily before meals. May substitute to any manufacturer covered by patient's insurance. 08/18/22 10/24/22  Almon Hercules, MD  insulin aspart protamine - aspart (NOVOLOG 70/30 MIX) (70-30) 100 UNIT/ML FlexPen Inject 15 Units into the skin 2 (two) times daily with a  meal. 08/18/22   Almon Hercules, MD  Insulin Pen Needle (PEN NEEDLES 3/16") 31G X 5 MM MISC 1 Pen by Does not apply route in the morning and at bedtime. 08/18/22   Almon Hercules, MD  Lancet Device MISC 1 each by Does not apply route 3 (three) times daily before meals. May substitute to any manufacturer covered by patient's insurance. 08/18/22 09/17/22  Almon Hercules, MD  levothyroxine (SYNTHROID) 125 MCG tablet Take 1 tablet (125 mcg total) by mouth daily before breakfast. 08/18/22   Almon Hercules, MD  pantoprazole (PROTONIX) 20 MG tablet Take 1 tablet (20 mg total) by mouth daily. 01/10/22   Bethann Berkshire, MD   Allergies  Allergen Reactions   Lantus [Insulin Glargine] Itching and Swelling    angioedema   Levemir [Insulin Detemir] Itching and Swelling    angioedema   Pineapple Anaphylaxis    Not listed on the [facility]   Codeine Other (See Comments)    Unknown reaction per [facility]   Invokana [Canagliflozin] Other (See Comments)    Constipation, nightmares Not listed on the [facility]   Penicillins Other (See Comments)    Unknown reaction per [facility]    Sulfa Antibiotics Other (See Comments)    Unknown reaction per [facility]   Tradjenta [Linagliptin] Other (See Comments)    Unknown   Vioxx [Rofecoxib] Other (See Comments)    Unknown reaction per MAR   Exelon [Rivastigmine] Rash   Review of Systems  Unable to perform ROS: Dementia    Physical Exam Constitutional:      Appearance: She is normal weight. She is ill-appearing.  Skin:    General: Skin is warm and dry.  Neurological:     Mental Status: She is lethargic.  Psychiatric:        Cognition and Memory: Cognition is impaired.     Vital Signs: BP (!) 152/96 (BP Location: Left Arm)   Pulse (!) 101   Temp 97.7 F (36.5 C) (Oral)   Resp 16   Ht 5\' 4"  (1.626 m)   Wt 64.1 kg   SpO2 100%   BMI 24.26 kg/m  Pain Scale: PAINAD   Pain Score: 0-No pain   SpO2: SpO2: 100 % O2 Device:SpO2: 100 % O2 Flow Rate: .   IO: Intake/output summary:   Intake/Output Summary (Last 24 hours) at 08/26/2022 1052 Last data filed at 08/26/2022 0600 Gross per 24 hour  Intake 881.88 ml  Output 750 ml  Net 131.88 ml    LBM: Last BM Date : 08/23/22 Baseline Weight: Weight: 64.1 kg Most recent weight: Weight: 64.1 kg     Palliative Assessment/Data: 30 %      Time 75 minutes   Signed by: Allison Creed, NP   Please contact Palliative Medicine Team phone at [phone] for questions and concerns.  For individual provider: See Loretha Stapler

## 2022-08-26 NOTE — Progress Notes (Signed)
Progress Note    Allison [name]   QIH:[id number]  DOB: 1934-06-08  DOA: 08/24/2022     2 PCP: [facility]  Initial CC: AMS  Hospital Course: Ms. Sowada is an 88 yo female with PMH HTN, HLD, CVA, DM II, hypothyroidism, dementia who presented with altered mentation.  Workup was concerning for UTI and hypernatremia; she was started on antibiotics and IVF.  Interval History:  No events overnight.  Resting comfortably in bed but grossly confused.  Able to follow some commands.  Assessment and Plan:  Goals of care -Previous provider discussed with her son, Alinda Money -Follow-up palliative care evaluation   Sepsis secondary to UTI Leukocytosis, tachycardia, tachypneic  UA appears c/w UTI  -Urine culture noted with multiple species, unable to further determine - Complete empiric course of antibiotics   Acute kidney injury  Hypernatremia - Na remains elevated; continue D5W - mild improvement in creat, 1.6   Acute Metabolic encephalopathy Alzheimer's dementia Due to above Delirium precautions Additional workup as indicated -Evaluated by SLP.  Continue dysphagia level 1 diet   Uncontrolled diabetes mellitus type 2, without long-term use of insulin Last available hemoglobin A1c was 11.5.  Just recommended to be discharged on 70/30 insulin 15 units twice daily. Q4hr SSI, NPH    Essential hypertension Continue amlodipine   Hyperlipidemia Continue atorvastatin   Hypothyroidism Last TSH- 7.686 with free T4-0.91 on 8/5.   Continue levothyroxine Recommend repeat TSH check in 4-5 weeks   GERD Continue PPI     Old records reviewed in assessment of this patient  Antimicrobials: Rocephin 8/12 >> 8/13 Cefepime 8/13 >> current  Vanc 08/26/22 >> current   DVT prophylaxis:  enoxaparin (LOVENOX) injection 30 mg Start: 08/25/22 1600   Code Status:   Code Status: Full Code  Mobility Assessment (Last 72 Hours)     Mobility Assessment      Row Name 08/25/22 2010 08/25/22 1324 08/25/22 1046 08/24/22 2000 08/24/22 1630   Does patient have an order for bedrest or is patient medically unstable Yes- Bedfast (Level 1) - Complete -- -- Yes- Bedfast (Level 1) - Complete Yes- Bedfast (Level 1) - Complete   What is the highest level of mobility based on the progressive mobility assessment? Level 1 (Bedfast) - Unable to balance while sitting on edge of bed Level 1 (Bedfast) - Unable to balance while sitting on edge of bed Level 1 (Bedfast) - Unable to balance while sitting on edge of bed Level 1 (Bedfast) - Unable to balance while sitting on edge of bed Level 1 (Bedfast) - Unable to balance while sitting on edge of bed   Is the above level different from baseline mobility prior to current illness? Yes - Recommend PT order -- -- Yes - Recommend PT order Yes - Recommend PT order            Barriers to discharge:  Disposition Plan:  SNF Status is: Inpt  Objective: Blood pressure (!) 152/96, pulse (!) 101, temperature 97.7 F (36.5 C), temperature source Oral, resp. rate 16, height 5\' 4"  (1.626 m), weight 64.1 kg, SpO2 100%.  Examination:  Physical Exam Constitutional:      Comments: Grossly confused and chronically ill-appearing elderly woman lying in bed in no distress  HENT:     Head: Normocephalic and atraumatic.     Mouth/Throat:     Mouth: Mucous membranes are dry.  Eyes:     Extraocular Movements: Extraocular movements intact.  Cardiovascular:  Rate and Rhythm: Normal rate and regular rhythm.  Pulmonary:     Effort: Pulmonary effort is normal. No respiratory distress.     Breath sounds: Normal breath sounds.  Abdominal:     General: Bowel sounds are normal. There is no distension.     Palpations: Abdomen is soft.     Tenderness: There is no abdominal tenderness.  Musculoskeletal:        General: No swelling. Normal range of motion.     Cervical back: Normal range of motion and neck supple.  Skin:    General: Skin is  warm and dry.     Capillary Refill: Capillary refill takes less than 2 seconds.     Coloration: Skin is not jaundiced.  Neurological:     Mental Status: Mental status is at baseline.  Psychiatric:        Mood and Affect: Mood normal.      Consultants:    Procedures:    Data Reviewed: Results for orders placed or performed during the hospital encounter of 08/24/22 (from the past 24 hour(s))  Basic metabolic panel     Status: Abnormal   Collection Time: 08/25/22  4:01 PM  Result Value Ref Range   Sodium 155 (H) 135 - 145 mmol/L   Potassium 3.5 3.5 - 5.1 mmol/L   Chloride 117 (H) 98 - 111 mmol/L   CO2 20 (L) 22 - 32 mmol/L   Glucose, Bld 415 (H) 70 - 99 mg/dL   BUN 52 (H) 8 - 23 mg/dL   Creatinine, Ser 4.54 (H) 0.44 - 1.00 mg/dL   Calcium 9.5 8.9 - 09.8 mg/dL   GFR, Estimated 29 (L) >60 mL/min   Anion gap 18 (H) 5 - 15  Glucose, capillary     Status: Abnormal   Collection Time: 08/25/22  4:29 PM  Result Value Ref Range   Glucose-Capillary 337 (H) 70 - 99 mg/dL  Glucose, capillary     Status: Abnormal   Collection Time: 08/25/22  9:35 PM  Result Value Ref Range   Glucose-Capillary 310 (H) 70 - 99 mg/dL  Glucose, capillary     Status: Abnormal   Collection Time: 08/26/22 12:09 AM  Result Value Ref Range   Glucose-Capillary 274 (H) 70 - 99 mg/dL  MRSA Next Gen by PCR, Nasal     Status: None   Collection Time: 08/26/22 12:18 AM   Specimen: Nasal Mucosa; Nasal Swab  Result Value Ref Range   MRSA by PCR Next Gen NOT DETECTED NOT DETECTED  Respiratory (~20 pathogens) panel by PCR     Status: None   Collection Time: 08/26/22 12:18 AM   Specimen: Nasopharyngeal Swab; Respiratory  Result Value Ref Range   Adenovirus NOT DETECTED NOT DETECTED   Coronavirus 229E NOT DETECTED NOT DETECTED   Coronavirus HKU1 NOT DETECTED NOT DETECTED   Coronavirus NL63 NOT DETECTED NOT DETECTED   Coronavirus OC43 NOT DETECTED NOT DETECTED   Metapneumovirus NOT DETECTED NOT DETECTED    Rhinovirus / Enterovirus NOT DETECTED NOT DETECTED   Influenza A NOT DETECTED NOT DETECTED   Influenza B NOT DETECTED NOT DETECTED   Parainfluenza Virus 1 NOT DETECTED NOT DETECTED   Parainfluenza Virus 2 NOT DETECTED NOT DETECTED   Parainfluenza Virus 3 NOT DETECTED NOT DETECTED   Parainfluenza Virus 4 NOT DETECTED NOT DETECTED   Respiratory Syncytial Virus NOT DETECTED NOT DETECTED   Bordetella pertussis NOT DETECTED NOT DETECTED   Bordetella Parapertussis NOT DETECTED NOT DETECTED   Chlamydophila  pneumoniae NOT DETECTED NOT DETECTED   Mycoplasma pneumoniae NOT DETECTED NOT DETECTED  SARS Coronavirus 2 by RT PCR (hospital order, performed in [facility] hospital lab) *cepheid single result test* Nasal Mucosa     Status: None   Collection Time: 08/26/22 12:18 AM   Specimen: Nasal Mucosa; Nasal Swab  Result Value Ref Range   SARS Coronavirus 2 by RT PCR NEGATIVE NEGATIVE  Glucose, capillary     Status: Abnormal   Collection Time: 08/26/22  6:04 AM  Result Value Ref Range   Glucose-Capillary 291 (H) 70 - 99 mg/dL  Glucose, capillary     Status: Abnormal   Collection Time: 08/26/22  7:32 AM  Result Value Ref Range   Glucose-Capillary 307 (H) 70 - 99 mg/dL  CBC     Status: Abnormal   Collection Time: 08/26/22  8:02 AM  Result Value Ref Range   WBC 26.4 (H) 4.0 - 10.5 K/uL   RBC 5.65 (H) 3.87 - 5.11 MIL/uL   Hemoglobin 15.5 (H) 12.0 - 15.0 g/dL   HCT 78.2 (H) 95.6 - 21.3 %   MCV 82.8 80.0 - 100.0 fL   MCH 27.4 26.0 - 34.0 pg   MCHC 33.1 30.0 - 36.0 g/dL   RDW 08.6 (H) 57.8 - 46.9 %   Platelets 157 150 - 400 K/uL   nRBC 0.1 0.0 - 0.2 %  Basic metabolic panel     Status: Abnormal   Collection Time: 08/26/22  9:31 AM  Result Value Ref Range   Sodium 156 (H) 135 - 145 mmol/L   Potassium 2.8 (L) 3.5 - 5.1 mmol/L   Chloride 121 (H) 98 - 111 mmol/L   CO2 22 22 - 32 mmol/L   Glucose, Bld 347 (H) 70 - 99 mg/dL   BUN 37 (H) 8 - 23 mg/dL   Creatinine, Ser 6.29 (H) 0.44 - 1.00 mg/dL    Calcium 9.5 8.9 - 52.8 mg/dL   GFR, Estimated 31 (L) >60 mL/min   Anion gap 13 5 - 15  Magnesium     Status: None   Collection Time: 08/26/22  9:31 AM  Result Value Ref Range   Magnesium 2.0 1.7 - 2.4 mg/dL  Phosphorus     Status: Abnormal   Collection Time: 08/26/22  9:31 AM  Result Value Ref Range   Phosphorus 1.4 (L) 2.5 - 4.6 mg/dL  Glucose, capillary     Status: Abnormal   Collection Time: 08/26/22 11:39 AM  Result Value Ref Range   Glucose-Capillary 246 (H) 70 - 99 mg/dL    I have reviewed pertinent nursing notes, vitals, labs, and images as necessary. I have ordered labwork to follow up on as indicated.  I have reviewed the last notes from staff over past 24 hours. I have discussed patient's care plan and test results with nursing staff, CM/SW, and other staff as appropriate.  Time spent: Greater than 50% of the 55 minute visit was spent in counseling/coordination of care for the patient as laid out in the A&P.   LOS: 2 days   Lewie Chamber, MD [facility] 08/26/2022, 12:47 PM

## 2022-08-27 DIAGNOSIS — E870 Hyperosmolality and hypernatremia: Secondary | ICD-10-CM | POA: Diagnosis not present

## 2022-08-27 DIAGNOSIS — N179 Acute kidney failure, unspecified: Secondary | ICD-10-CM | POA: Diagnosis not present

## 2022-08-27 DIAGNOSIS — E860 Dehydration: Secondary | ICD-10-CM | POA: Diagnosis not present

## 2022-08-27 DIAGNOSIS — G9341 Metabolic encephalopathy: Secondary | ICD-10-CM | POA: Diagnosis not present

## 2022-08-27 DIAGNOSIS — E440 Moderate protein-calorie malnutrition: Secondary | ICD-10-CM | POA: Insufficient documentation

## 2022-08-27 LAB — GLUCOSE, CAPILLARY
Glucose-Capillary: 107 mg/dL — ABNORMAL HIGH (ref 70–99)
Glucose-Capillary: 129 mg/dL — ABNORMAL HIGH (ref 70–99)
Glucose-Capillary: 166 mg/dL — ABNORMAL HIGH (ref 70–99)
Glucose-Capillary: 179 mg/dL — ABNORMAL HIGH (ref 70–99)
Glucose-Capillary: 189 mg/dL — ABNORMAL HIGH (ref 70–99)
Glucose-Capillary: 202 mg/dL — ABNORMAL HIGH (ref 70–99)

## 2022-08-27 LAB — BASIC METABOLIC PANEL
Anion gap: 11 (ref 5–15)
BUN: 24 mg/dL — ABNORMAL HIGH (ref 8–23)
CO2: 23 mmol/L (ref 22–32)
Calcium: 8.5 mg/dL — ABNORMAL LOW (ref 8.9–10.3)
Chloride: 117 mmol/L — ABNORMAL HIGH (ref 98–111)
Creatinine, Ser: 1.14 mg/dL — ABNORMAL HIGH (ref 0.44–1.00)
GFR, Estimated: 47 mL/min — ABNORMAL LOW (ref >60)
Glucose, Bld: 204 mg/dL — ABNORMAL HIGH (ref 70–99)
Potassium: 2.9 mmol/L — ABNORMAL LOW (ref 3.5–5.1)
Sodium: 151 mmol/L — ABNORMAL HIGH (ref 135–145)

## 2022-08-27 MED ORDER — POTASSIUM CHLORIDE 10 MEQ/100ML IV SOLN
10.0000 meq | INTRAVENOUS | Status: AC
  Administered 2022-08-27 (×4): 10 meq via INTRAVENOUS
  Filled 2022-08-27 (×2): qty 100

## 2022-08-27 NOTE — Progress Notes (Signed)
[facility]  PROGRESS NOTE  Allison [name]:[id number] DOB: [birth year] DOA: 08/24/2022 PCP: [facility]   Brief HPI:   88 yo female with PMH HTN, HLD, CVA, DM II, hypothyroidism, dementia who presented with altered mental status. Workup was concerning for UTI and hypernatremia; she was started on antibiotics and IVF.     Assessment/Plan:   Sepsis secondary to UTI/pneumonia -Sepsis physiology has resolved -WBC still elevated 26,000; follow CBC in a.m. -She is afebrile, heart rate normal, blood pressure is stable  ?  UTI -Urine culture grew multiple species -Currently on vancomycin and cefepime  Pneumonia -Continue vancomycin/cefepime -Blood cultures x 2 are negative to date -Treat for total 5 days of antibiotics  Hypernatremia/AKI -Insetting of poor p.o. intake -Sodium still elevated at 151 improving; was 156 yesterday -Continue D5W -Creatinine improved to 1.14  Acute metabolic encephalopathy/possible dementia -Delirium precautions -Speech therapy evaluation obtained, continue dysphagia 1 diet  Hypokalemia -Potassium is 2.9 -Replace potassium and follow BMP in am  Diabetes mellitus type 2 -CBG well-controlled -Continue NPH insulin 10 units subcu twice daily, sliding scale insulin with NovoLog  Hypertension -Blood pressure well-controlled -Continue amlodipine  Hyperlipidemia -Continue atorvastatin  Hypothyroidism -Continue Synthroid TSH 7.686, T4  0.91 on 8/5 -Follow TSH as outpatient  Goals of care -Palliative care consulted -Family opting for full scope of care  Medications     amLODipine  10 mg Oral Daily   aspirin EC  81 mg Oral Daily   atorvastatin  40 mg Oral Daily   enoxaparin (LOVENOX) injection  30 mg Subcutaneous Q24H   feeding supplement (GLUCERNA SHAKE)  237 mL Oral TID BM   insulin aspart  0-9 Units Subcutaneous Q4H   insulin NPH Human  10 Units Subcutaneous BID AC & HS   levothyroxine  125  mcg Oral QAC breakfast   pantoprazole  20 mg Oral Daily   sodium chloride flush  3 mL Intravenous Q12H     Data Reviewed:   CBG:  Recent Labs  Lab 08/26/22 1948 08/26/22 2024 08/27/22 0002 08/27/22 0507 08/27/22 0730  GLUCAP 184* 223* 202* 189* 179*    SpO2: 100 %    Vitals:   08/26/22 1652 08/26/22 2022 08/27/22 0500 08/27/22 0937  BP: (!) 158/81 132/81 (!) 156/74 (!) 153/83  Pulse: (!) 104 96  83  Resp: 16 18 20 18   Temp: 98.2 F (36.8 C) 98.1 F (36.7 C) (!) 97 F (36.1 C) 98.3 F (36.8 C)  TempSrc:   Axillary   SpO2: 94% 98% 100% 100%  Weight:      Height:          Data Reviewed:  Basic Metabolic Panel: Recent Labs  Lab 08/24/22 1033 08/24/22 1039 08/25/22 0716 08/25/22 1601 08/26/22 0931 08/27/22 0633  NA 156* 155* 153* 155* 156* 151*  K 4.1 5.4* 3.8 3.5 2.8* 2.9*  CL 121*  --  117* 117* 121* 117*  CO2 18*  --  13* 20* 22 23  GLUCOSE 134*  --  318* 415* 347* 204*  BUN 68*  --  55* 52* 37* 24*  CREATININE 2.08*  --  1.87* 1.70* 1.60* 1.14*  CALCIUM 9.7  --  9.4 9.5 9.5 8.5*  MG  --   --   --   --  2.0  --   PHOS  --   --   --   --  1.4*  --     CBC: Recent Labs  Lab 08/24/22  1033 08/24/22 1039 08/25/22 0716 08/26/22 0802  WBC 22.9*  --  26.4* 26.4*  NEUTROABS 19.7*  --   --   --   HGB 15.1* 15.0 16.2* 15.5*  HCT 46.9* 44.0 51.6* 46.8*  MCV 85.3  --  87.0 82.8  PLT PLATELET CLUMPS NOTED ON SMEAR, COUNT APPEARS ADEQUATE  --  168 157    LFT Recent Labs  Lab 08/24/22 1033  AST 32  ALT 19  ALKPHOS 73  BILITOT 1.9*  PROT 6.6  ALBUMIN 3.0*     Antibiotics: Anti-infectives (From admission, onward)    Start     Dose/Rate Route Frequency Ordered Stop   08/26/22 2200  vancomycin (VANCOREADY) IVPB 500 mg/100 mL        500 mg 100 mL/hr over 60 Minutes Intravenous Every 24 hours 08/25/22 2313     08/26/22 1700  ceFEPIme (MAXIPIME) 2 g in sodium chloride 0.9 % 100 mL IVPB        2 g 200 mL/hr over 30 Minutes Intravenous Every 24  hours 08/25/22 1617     08/26/22 0000  vancomycin (VANCOCIN) IVPB 1000 mg/200 mL premix        1,000 mg 200 mL/hr over 60 Minutes Intravenous  Once 08/25/22 2312 08/26/22 0104   08/25/22 1700  ceFEPIme (MAXIPIME) 2 g in sodium chloride 0.9 % 100 mL IVPB        2 g 200 mL/hr over 30 Minutes Intravenous  Once 08/25/22 1610 08/25/22 2230   08/25/22 1000  cefTRIAXone (ROCEPHIN) 1 g in sodium chloride 0.9 % 100 mL IVPB  Status:  Discontinued        1 g 200 mL/hr over 30 Minutes Intravenous Every 24 hours 08/24/22 1501 08/25/22 1557   08/24/22 1415  cefTRIAXone (ROCEPHIN) 1 g in sodium chloride 0.9 % 100 mL IVPB        1 g 200 mL/hr over 30 Minutes Intravenous  Once 08/24/22 1406 08/24/22 1644        DVT prophylaxis: Lovenox  Code Status: Full code  Family Communication: No family at bedside   CONSULTS    Subjective   Continues to be lethargic   Objective    Physical Examination:   General: Appears in no acute distress Cardiovascular: S1-S2, regular Respiratory: Lungs clear to auscultation bilaterally Abdomen: Soft, nontender, no organomegaly Extremities: No edema in the lower extremities Neurologic: Somnolent but arousable   Status is: Inpatient:      Pressure Injury 08/25/22 Coccyx Bilateral Deep Tissue Pressure Injury - Purple or maroon localized area of discolored intact skin or blood-filled blister due to damage of underlying soft tissue from pressure and/or shear. (Active)  08/25/22 2330  Location: Coccyx  Location Orientation: Bilateral  Staging: Deep Tissue Pressure Injury - Purple or maroon localized area of discolored intact skin or blood-filled blister due to damage of underlying soft tissue from pressure and/or shear.  Wound Description (Comments):   Present on Admission: Yes         S    [facility] If 7PM-7AM, please contact night-coverage at [email or web address], Office  [phone]   08/27/2022, 10:52 AM  LOS: 3 days

## 2022-08-27 NOTE — Plan of Care (Signed)

## 2022-08-27 NOTE — TOC Progression Note (Signed)
Transition of Care [facility]) - Initial/Assessment Note    Patient Details  Name: Allison [name] MRN: [id number] Date of Birth: [birth year]  Transition of [facility]) CM/SW Contact:    Ralene Bathe, LCSW Phone Number: 08/27/2022, 3:24 PM  Clinical Narrative:                 LCSW received a call from [facility] social worker, Irving Burton, who reports that [name] agreed to [facility] the patient. LCSW sent the referral to [facility] again to review.  TOC following.         Patient Goals and CMS Choice            Expected Discharge Plan and Services                                              Prior Living Arrangements/Services                       Activities of Daily Living Home Assistive Devices/Equipment: Wheelchair ADL Screening (condition at time of admission) Patient's cognitive ability adequate to safely complete daily activities?: No Is the patient deaf or have difficulty hearing?: Yes Does the patient have difficulty seeing, even when wearing glasses/contacts?: Yes Does the patient have difficulty concentrating, remembering, or making decisions?: Yes Patient able to express need for assistance with ADLs?: Yes Does the patient have difficulty dressing or bathing?: Yes Independently performs ADLs?: No Does the patient have difficulty walking or climbing stairs?: Yes Weakness of Legs: Both Weakness of Arms/Hands: Both  Permission Sought/Granted                  Emotional Assessment              Admission diagnosis:  Dehydration [E86.0] Encephalopathy [G93.40] AKI (acute kidney injury) (HCC) [N17.9] Patient Active Problem List   Diagnosis Date Noted   Malnutrition of moderate degree 08/27/2022   AKI (acute kidney injury) (HCC) 08/24/2022   Sepsis secondary to UTI (HCC) 08/24/2022   Acute metabolic encephalopathy 08/24/2022   Diabetic ketoacidosis (HCC) 08/16/2022   DKA, type 2 (HCC) 08/15/2022   Alzheimer's dementia (HCC)  08/15/2022   Hypernatremia 08/15/2022   Family discord 03/28/2019   Neurocognitive deficits 01/04/2018   History of stroke 10/01/2016   Anxiety state 06/03/2016   Visual hallucinations 10/28/2015   CVA (cerebral vascular accident) (HCC) 10/28/2015   Ischemic stroke (HCC)    Hx of adenomatous colonic polyps 06/19/2014   Neuropathic pain 03/27/2014   Uncontrolled type 2 diabetes mellitus with hyperglycemia, without long-term current use of insulin (HCC) 03/27/2014   Bilateral carpal tunnel syndrome 03/27/2014   Dehydration 08/05/2013   Diabetes mellitus with hyperglycemia (HCC) 08/05/2013   Hypercalcemia 08/05/2013   PVD (peripheral vascular disease) (HCC) 03/05/2012   Onychomycosis 03/05/2012   Other hammer toe (acquired) 03/05/2012   Abnormal CXR 12/25/2011   Cystocele 06/11/2011   Incontinence 06/04/2011   Depression 06/04/2011   Hypothyroidism 06/23/2006   Dyslipidemia 06/23/2006   Essential hypertension 06/23/2006   GERD 06/23/2006   IBS 06/23/2006   PCP:  [facility], [facility]:   [facility] - [place], [place] - [address] [place] [address] Phone: [phone] Fax: [phone]  [facility] - [place] ([place]), [place] - [address] [place] ([place]) [place] [address] Phone: [phone] Fax: [phone]     Social Determinants of Health (SDOH) Social History: SDOH Screenings   Food Insecurity: No Food Insecurity (08/24/2022)  Housing: Low Risk  (08/24/2022)  Transportation Needs: No Transportation Needs (08/24/2022)  Utilities: Not At Risk (08/24/2022)  Alcohol Screen: Low Risk  (10/11/2017)  Financial Resource Strain: Low Risk  (06/28/2017)  Physical Activity: Insufficiently Active (06/28/2017)  Social Connections: Somewhat Isolated (06/28/2017)  Stress: Stress Concern Present (06/28/2017)  Tobacco Use: Low Risk  (08/25/2022)   SDOH Interventions:      Readmission Risk Interventions    08/18/2022   12:38 PM  Readmission Risk Prevention Plan  Transportation Screening Complete  PCP or Specialist Appt within 5-7 Days Complete  Home Care Screening Complete  Medication Review (RN CM) Complete

## 2022-08-28 DIAGNOSIS — E860 Dehydration: Secondary | ICD-10-CM | POA: Diagnosis not present

## 2022-08-28 DIAGNOSIS — N179 Acute kidney failure, unspecified: Secondary | ICD-10-CM | POA: Diagnosis not present

## 2022-08-28 DIAGNOSIS — G9341 Metabolic encephalopathy: Secondary | ICD-10-CM | POA: Diagnosis not present

## 2022-08-28 DIAGNOSIS — E870 Hyperosmolality and hypernatremia: Secondary | ICD-10-CM | POA: Diagnosis not present

## 2022-08-28 LAB — RENAL FUNCTION PANEL
Albumin: 1.8 g/dL — ABNORMAL LOW (ref 3.5–5.0)
Anion gap: 13 (ref 5–15)
BUN: 18 mg/dL (ref 8–23)
CO2: 19 mmol/L — ABNORMAL LOW (ref 22–32)
Calcium: 7.7 mg/dL — ABNORMAL LOW (ref 8.9–10.3)
Chloride: 107 mmol/L (ref 98–111)
Creatinine, Ser: 1.11 mg/dL — ABNORMAL HIGH (ref 0.44–1.00)
GFR, Estimated: 49 mL/min — ABNORMAL LOW (ref >60)
Glucose, Bld: 251 mg/dL — ABNORMAL HIGH (ref 70–99)
Phosphorus: 1.7 mg/dL — ABNORMAL LOW (ref 2.5–4.6)
Potassium: 2.7 mmol/L — CL (ref 3.5–5.1)
Sodium: 139 mmol/L (ref 135–145)

## 2022-08-28 LAB — CBC
HCT: 44.5 % (ref 36.0–46.0)
Hemoglobin: 14.8 g/dL (ref 12.0–15.0)
MCH: 26.5 pg (ref 26.0–34.0)
MCHC: 33.3 g/dL (ref 30.0–36.0)
MCV: 79.7 fL — ABNORMAL LOW (ref 80.0–100.0)
Platelets: 124 10*3/uL — ABNORMAL LOW (ref 150–400)
RBC: 5.58 MIL/uL — ABNORMAL HIGH (ref 3.87–5.11)
RDW: 15.3 % (ref 11.5–15.5)
WBC: 19.1 10*3/uL — ABNORMAL HIGH (ref 4.0–10.5)
nRBC: 0 % (ref 0.0–0.2)

## 2022-08-28 LAB — GLUCOSE, CAPILLARY
Glucose-Capillary: 195 mg/dL — ABNORMAL HIGH (ref 70–99)
Glucose-Capillary: 217 mg/dL — ABNORMAL HIGH (ref 70–99)
Glucose-Capillary: 218 mg/dL — ABNORMAL HIGH (ref 70–99)
Glucose-Capillary: 219 mg/dL — ABNORMAL HIGH (ref 70–99)
Glucose-Capillary: 252 mg/dL — ABNORMAL HIGH (ref 70–99)
Glucose-Capillary: 265 mg/dL — ABNORMAL HIGH (ref 70–99)
Glucose-Capillary: 276 mg/dL — ABNORMAL HIGH (ref 70–99)

## 2022-08-28 LAB — LEGIONELLA PNEUMOPHILA SEROGP 1 UR AG: L. pneumophila Serogp 1 Ur Ag: NEGATIVE

## 2022-08-28 MED ORDER — POLYETHYLENE GLYCOL 3350 17 G PO PACK
17.0000 g | PACK | Freq: Every day | ORAL | Status: DC
  Administered 2022-08-28 – 2022-08-31 (×3): 17 g via ORAL
  Filled 2022-08-28 (×3): qty 1

## 2022-08-28 MED ORDER — POTASSIUM CHLORIDE 10 MEQ/100ML IV SOLN
10.0000 meq | INTRAVENOUS | Status: AC
  Administered 2022-08-28 (×6): 10 meq via INTRAVENOUS
  Filled 2022-08-28 (×6): qty 100

## 2022-08-28 MED ORDER — K PHOS MONO-SOD PHOS DI & MONO 155-852-130 MG PO TABS
500.0000 mg | ORAL_TABLET | Freq: Three times a day (TID) | ORAL | Status: AC
  Administered 2022-08-28 – 2022-08-29 (×4): 500 mg via ORAL
  Filled 2022-08-28 (×4): qty 2

## 2022-08-28 NOTE — Plan of Care (Signed)
  Problem: Education: Goal: Individualized Educational Video(s) Outcome: Not Applicable   

## 2022-08-28 NOTE — Inpatient Diabetes Management (Signed)
Inpatient Diabetes Program Recommendations  AACE/ADA: New Consensus Statement on Inpatient Glycemic Control   Target Ranges:  Prepandial:   less than 140 mg/dL      Peak postprandial:   less than 180 mg/dL (1-2 hours)      Critically ill patients:  140 - 180 mg/dL    Latest Reference Range & Units 08/27/22 07:30 08/27/22 11:45 08/27/22 16:46 08/27/22 19:58 08/28/22 00:49 08/28/22 06:10 08/28/22 07:34  Glucose-Capillary 70 - 99 mg/dL 865 (H) 784 (H) 696 (H) 166 (H) 217 (H) 252 (H) 219 (H)   Review of Glycemic Control  Diabetes history: DM2 Outpatient Diabetes medications: 70/30 15 units BID Current orders for Inpatient glycemic control: NPH 10 units BID, Novolog 0-9 units Q4H  Inpatient Diabetes Program Recommendations:    Insulin: Please change NPH to 10 units QAM and 12 units at bedtime.  Thanks, Orlando Penner, RN, MSN, CDCES Diabetes Coordinator Inpatient Diabetes Program [phone] (Team Pager from 8am to 5pm)

## 2022-08-28 NOTE — Plan of Care (Signed)

## 2022-08-28 NOTE — Progress Notes (Signed)
Physical Therapy Treatment Patient Details Name: Allison [name] MRN: [id number] DOB: 06-14-1934 Today's Date: 08/28/2022   History of Present Illness 88 y.o. female presents to [facility] hospital on 08/24/2022 with AMS. UA concerning for UTI, pt also with AKI 2/2 dehydration. PMH includes dementia, DMII, hypothyroidism, CVA, HTN, GERD, HLD.    PT Comments  Pt received in supine, awake and able to state month/day of birth but not year, not otherwise oriented. Pt needing up to maxA to perform transfer to EOB, but with limited command follow once sitting EOB and appeared quick to fatigue, needing increased assist +2 totalA for sit>supine transfer. Pt with limited progress thus far, could consider goal downgrade next session if not progressing, plan to have pt hoyer OOB to chair next session if she remains unable to participate in sit to stand transfer training.     If plan is discharge home, recommend the following: Two people to help with walking and/or transfers;Two people to help with bathing/dressing/bathroom;Assistance with cooking/housework;Assistance with feeding;Direct supervision/assist for medications management;Direct supervision/assist for financial management;Assist for transportation;Help with stairs or ramp for entrance;Supervision due to cognitive status   Can travel by private vehicle     No  Equipment Recommendations  Hospital bed;Hoyer lift;Wheelchair (measurements PT)    Recommendations for Other Services       Precautions / Restrictions Precautions Precautions: Fall Precaution Comments: advanced dementia Restrictions Weight Bearing Restrictions: No     Mobility  Bed Mobility Overal bed mobility: Needs Assistance Bed Mobility: Rolling, Sidelying to Sit, Sit to Sidelying Rolling: Max assist Sidelying to sit: Max assist     Sit to sidelying: Total assist, +2 for physical assistance General bed mobility comments: NT assist for posterior supine scoot toward HOB, pt  unable to assist with return to supine but did use arms to push up away from bed when sitting up initially.    Transfers Overall transfer level: Needs assistance                 General transfer comment: pt too lethargic to safely attempt standing, keeping eyes closed after sitting EOB ~5 mins.    Ambulation/Gait                   Stairs             Wheelchair Mobility     Tilt Bed    Modified Rankin (Stroke Patients Only)       Balance Overall balance assessment: Needs assistance Sitting-balance support: Single extremity supported, Bilateral upper extremity supported, Feet supported Sitting balance-Leahy Scale: Poor Sitting balance - Comments: initially maxA, progressing to zero/totalA       Standing balance comment: defer, pt not able                            Cognition Arousal: Lethargic Behavior During Therapy: Flat affect Overall Cognitive Status: History of cognitive impairments - at baseline Area of Impairment: Orientation, Attention, Memory, Following commands, Safety/judgement, Awareness, Problem solving                 Orientation Level: Disoriented to, Place, Time, Situation (Unable to provide DOB) Current Attention Level: Focused Memory: Decreased recall of precautions, Decreased short-term memory Following Commands: Follows one step commands inconsistently Safety/Judgement: Decreased awareness of safety, Decreased awareness of deficits Awareness: Intellectual Problem Solving: Slow processing, Decreased initiation, Requires tactile cues, Requires verbal cues, Difficulty sequencing General Comments: Pt states [birth year] but unable to state  year of birth, not otherwise oriented. Increased lethargy with sitting EOB.        Exercises Other Exercises Other Exercises: PROM to BLE all joints, planes x 10 reps each within range of pt tolerance. Pt not assisted wtih seated LAQ attempt x5 reps    General Comments General  comments (skin integrity, edema, etc.): Pt heels floated, NT notified she will need bed bath due to purewick leaking and NT preparing to assist her with this at end of session      Pertinent Vitals/Pain Pain Assessment Pain Assessment: PAINAD Breathing: normal Negative Vocalization: none Facial Expression: sad, frightened, frown Body Language: relaxed Consolability: no need to console PAINAD Score: 1 Pain Intervention(s): Monitored during session, Repositioned, Limited activity within patient's tolerance    Home Living                          Prior Function            PT Goals (current goals can now be found in the care plan section) Acute Rehab PT Goals Patient Stated Goal: pt unable to state, PT goal to improve bed mobility quality and reduce falls risk during transfers PT Goal Formulation: Patient unable to participate in goal setting Time For Goal Achievement: 09/08/22 Progress towards PT goals: Not progressing toward goals - comment (lethargy limiting her)    Frequency    Min 1X/week      PT Plan      Co-evaluation              AM-PAC PT "6 Clicks" Mobility   Outcome Measure  Help needed turning from your back to your side while in a flat bed without using bedrails?: A Lot Help needed moving from lying on your back to sitting on the side of a flat bed without using bedrails?: Total Help needed moving to and from a bed to a chair (including a wheelchair)?: Total Help needed standing up from a chair using your arms (e.g., wheelchair or bedside chair)?: Total Help needed to walk in hospital room?: Total Help needed climbing 3-5 steps with a railing? : Total 6 Click Score: 7    End of Session   Activity Tolerance: Patient limited by lethargy Patient left: in bed;with call bell/phone within reach;with bed alarm set Nurse Communication: Mobility status;Need for lift equipment PT Visit Diagnosis: Other abnormalities of gait and mobility  (R26.89);Muscle weakness (generalized) (M62.81)     Time: 4742-5956 PT Time Calculation (min) (ACUTE ONLY): 18 min  Charges:    $Therapeutic Activity: 8-22 mins PT General Charges $$ ACUTE PT VISIT: 1 Visit                     Daisa Stennis P., PTA Acute Rehabilitation Services Secure Chat Preferred 9a-5:30pm Office: [phone]    Dorathy Kinsman [facility] 08/28/2022, 5:37 PM

## 2022-08-28 NOTE — Progress Notes (Signed)
[facility]  PROGRESS NOTE  Allison [name] ZOX:[id number] DOB: 13-Oct-1934 DOA: 08/24/2022 PCP: [facility]   Brief HPI:   88 yo female with PMH HTN, HLD, CVA, DM II, hypothyroidism, dementia who presented with altered mental status. Workup was concerning for UTI and hypernatremia; she was started on antibiotics and IVF.     Assessment/Plan:   Sepsis secondary to UTI/pneumonia -Sepsis physiology has resolved -WBC still elevated 26,000; follow CBC in a.m. -She is afebrile, heart rate normal, blood pressure is stable  ?  UTI -Urine culture grew multiple species -Currently on vancomycin and cefepime  Pneumonia -Continue vancomycin/cefepime -Blood cultures x 2 are negative to date -Treat for total 5 days of antibiotics -Will discontinue vancomycin and continue with cefepime  Hypernatremia/AKI -Insetting of poor p.o. intake -Resolved, sodium 139 -Discontinue D5W -Creatinine improved to 1.11  Acute metabolic encephalopathy/possible dementia -Delirium precautions -Speech therapy evaluation obtained, continue dysphagia 1 diet  Hypokalemia -Potassium is 2.7 -Replace potassium and follow BMP in am  Hypophosphatemia -Phosphorus 1.7 -Replace phosphorus and follow phosphorus level in a.m.  Diabetes mellitus type 2 -CBG well-controlled -Continue NPH insulin 10 units subcu twice daily, sliding scale insulin with NovoLog  Hypertension -Blood pressure well-controlled -Continue amlodipine  Hyperlipidemia -Continue atorvastatin  Hypothyroidism -Continue Synthroid TSH 7.686, T4  0.91 on 8/5 -Follow TSH as outpatient  Goals of care -Palliative care consulted -Family opting for full scope of care  Medications     amLODipine  10 mg Oral Daily   aspirin EC  81 mg Oral Daily   atorvastatin  40 mg Oral Daily   enoxaparin (LOVENOX) injection  30 mg Subcutaneous Q24H   feeding supplement (GLUCERNA SHAKE)  237 mL Oral TID BM    insulin aspart  0-9 Units Subcutaneous Q4H   insulin NPH Human  10 Units Subcutaneous BID AC & HS   levothyroxine  125 mcg Oral QAC breakfast   pantoprazole  20 mg Oral Daily   phosphorus  500 mg Oral TID   polyethylene glycol  17 g Oral Daily   sodium chloride flush  3 mL Intravenous Q12H     Data Reviewed:   CBG:  Recent Labs  Lab 08/27/22 1958 08/28/22 0049 08/28/22 0610 08/28/22 0734 08/28/22 1115  GLUCAP 166* 217* 252* 219* 265*    SpO2: 97 %    Vitals:   08/27/22 1959 08/28/22 0610 08/28/22 0755 08/28/22 0936  BP: (!) 112/57 (!) 151/78 (!) 154/76   Pulse: 92 88 92 94  Resp: 18 18    Temp: 97.7 F (36.5 C) 97.9 F (36.6 C) 98.8 F (37.1 C)   TempSrc: Oral  Oral   SpO2: 94% 99% 97%   Weight:      Height:          Data Reviewed:  Basic Metabolic Panel: Recent Labs  Lab 08/25/22 0716 08/25/22 1601 08/26/22 0931 08/27/22 0633 08/28/22 0832  NA 153* 155* 156* 151* 139  K 3.8 3.5 2.8* 2.9* 2.7*  CL 117* 117* 121* 117* 107  CO2 13* 20* 22 23 19*  GLUCOSE 318* 415* 347* 204* 251*  BUN 55* 52* 37* 24* 18  CREATININE 1.87* 1.70* 1.60* 1.14* 1.11*  CALCIUM 9.4 9.5 9.5 8.5* 7.7*  MG  --   --  2.0  --   --   PHOS  --   --  1.4*  --  1.7*    CBC: Recent Labs  Lab 08/24/22 1033 08/24/22 1039 08/25/22 0716  08/26/22 0802  WBC 22.9*  --  26.4* 26.4*  NEUTROABS 19.7*  --   --   --   HGB 15.1* 15.0 16.2* 15.5*  HCT 46.9* 44.0 51.6* 46.8*  MCV 85.3  --  87.0 82.8  PLT PLATELET CLUMPS NOTED ON SMEAR, COUNT APPEARS ADEQUATE  --  168 157    LFT Recent Labs  Lab 08/24/22 1033 08/28/22 0832  AST 32  --   ALT 19  --   ALKPHOS 73  --   BILITOT 1.9*  --   PROT 6.6  --   ALBUMIN 3.0* 1.8*     Antibiotics: Anti-infectives (From admission, onward)    Start     Dose/Rate Route Frequency Ordered Stop   08/26/22 2200  vancomycin (VANCOREADY) IVPB 500 mg/100 mL        500 mg 100 mL/hr over 60 Minutes Intravenous Every 24 hours 08/25/22 2313      08/26/22 1700  ceFEPIme (MAXIPIME) 2 g in sodium chloride 0.9 % 100 mL IVPB        2 g 200 mL/hr over 30 Minutes Intravenous Every 24 hours 08/25/22 1617     08/26/22 0000  vancomycin (VANCOCIN) IVPB 1000 mg/200 mL premix        1,000 mg 200 mL/hr over 60 Minutes Intravenous  Once 08/25/22 2312 08/26/22 0104   08/25/22 1700  ceFEPIme (MAXIPIME) 2 g in sodium chloride 0.9 % 100 mL IVPB        2 g 200 mL/hr over 30 Minutes Intravenous  Once 08/25/22 1610 08/25/22 2230   08/25/22 1000  cefTRIAXone (ROCEPHIN) 1 g in sodium chloride 0.9 % 100 mL IVPB  Status:  Discontinued        1 g 200 mL/hr over 30 Minutes Intravenous Every 24 hours 08/24/22 1501 08/25/22 1557   08/24/22 1415  cefTRIAXone (ROCEPHIN) 1 g in sodium chloride 0.9 % 100 mL IVPB        1 g 200 mL/hr over 30 Minutes Intravenous  Once 08/24/22 1406 08/24/22 1644        DVT prophylaxis: Lovenox  Code Status: Full code  Family Communication: No family at bedside   CONSULTS    Subjective   Denies any complaints.   Objective    Physical Examination:   General-appears in no acute distress Heart-S1-S2, regular, no murmur auscultated Lungs-clear to auscultation bilaterally, no wheezing or crackles auscultated Abdomen-soft, nontender, no organomegaly Extremities-no edema in the lower extremities Neuro-alert, oriented x3, no focal deficit noted  Status is: Inpatient:      Pressure Injury 08/25/22 Coccyx Bilateral Deep Tissue Pressure Injury - Purple or maroon localized area of discolored intact skin or blood-filled blister due to damage of underlying soft tissue from pressure and/or shear. (Active)  08/25/22 2330  Location: Coccyx  Location Orientation: Bilateral  Staging: Deep Tissue Pressure Injury - Purple or maroon localized area of discolored intact skin or blood-filled blister due to damage of underlying soft tissue from pressure and/or shear.  Wound Description (Comments):   Present on Admission: Yes         Allison [name]   [facility] If 7PM-7AM, please contact night-coverage at [email or web address], Office  [phone]   08/28/2022, 12:10 PM  LOS: 4 days

## 2022-08-28 NOTE — Care Management Important Message (Signed)
Important Message  Patient Details  Name: Allison [name] MRN: [id number] Date of Birth: 1935/01/10   Medicare Important Message Given:  Yes     Kayron Hicklin Stefan [facility] 08/28/2022, 3:08 PM

## 2022-08-28 NOTE — Progress Notes (Signed)
Speech Language Pathology Treatment: Dysphagia  Patient Details Name: Allison [name] MRN: [id number] DOB: Jun 14, 1934 Today's Date: 08/28/2022 Time: 2952-8413 SLP Time Calculation (min) (ACUTE ONLY): 10 min  Assessment / Plan / Recommendation Clinical Impression  Pt seen for diet tolerance and trials of upgraded textures. Pt politely declined placement of upper denture plate as well as solid trials. Pt continues with s/sx at least a mild oral dysphagia with oral holding/delayed A-P transit of purees. Pharyngeal swallow appeared [facility] across trials this date. Oral deficits likely exacerbated by lethargy and cognitive status. Recommend continuation of current diet with safe swallowing strategies as outlined below. SLP to f/u x1 for trials of upgraded textures with the hope of pt resuming baseline diet.    HPI HPI: 88 y.o. female presents to [facility] hospital on 08/24/2022 with AMS. UA concerning for UTI, pt also with AKI 2/2 dehydration. PMH includes dementia, DMII, hypothyroidism, CVA, HTN, GERD, HLD. CXR, 08/24/22, "1. Shallow inspiration radiograph.  2. Subtle ill-defined opacity within the left lung base, which may  reflect atelectasis or infection.  3. Aortic Atherosclerosis (ICD10-I70.0)."      SLP Plan  Continue with current plan of care      Recommendations for follow up therapy are one component of a multi-disciplinary discharge planning process, led by the attending physician.  Recommendations may be updated based on patient status, additional functional criteria and insurance authorization.    Recommendations  Diet recommendations: Dysphagia 1 (puree);Thin liquid Liquids provided via: Teaspoon;Cup;Straw ("pinch" straw if pt tending to take rapid, sequential sips) Medication Administration: Whole meds with puree Compensations: Minimize environmental distractions;Slow rate;Small sips/bites Postural Changes and/or Swallow Maneuvers: Seated upright 90 degrees;Upright 30-60 min after meal                   Oral care QID;Staff/trained caregiver to provide oral care   Frequent or constant Supervision/Assistance Dysphagia, oropharyngeal phase (R13.12)     Continue with current plan of care    Allison [name], M.S., CCC-SLP Speech-Language Pathologist Secure Chat Preferred  O: [phone]  Allison [name]  08/28/2022, 10:29 AM

## 2022-08-28 NOTE — Progress Notes (Signed)
Occupational Therapy Treatment Patient Details Name: Allison [name] MRN: [id number] DOB: Apr 14, 1934 Today's Date: 08/28/2022   History of present illness 88 y.o. female presents to [facility] hospital on 08/24/2022 with AMS. UA concerning for UTI, pt also with AKI 2/2 dehydration. PMH includes dementia, DMII, hypothyroidism, CVA, HTN, GERD, HLD.   OT comments  Patient, unfortunately, was unable to demonstrate progress today, presenting with somnolence, limited verbalizations, and limited ability to follow 1-step commands or engage fully in simple ADLs such as washing face in supine. Pt required hand over hand and elbow support to RUE for ADL. Stiff with possible Bil hand flexion contractures forming and minimal engagement in AA/PROM BUE exercises.  Patient remains limited by cognitive deficits, profound generalized weakness, lethargy, and decreased activity tolerance along with deficits noted below. Pt continues to demonstrate guarded rehab potential but may benefit from continued skilled OT to increase safety and independence with ADLs and functional transfers to allow pt to return home safely and reduce caregiver burden and fall risk.       If plan is discharge home, recommend the following:  Assistance with cooking/housework;Direct supervision/assist for medications management;Direct supervision/assist for financial management;Help with stairs or ramp for entrance;Assist for transportation;A lot of help with bathing/dressing/bathroom;Two people to help with walking and/or transfers;Assistance with feeding;Supervision due to cognitive status   Equipment Recommendations       Recommendations for Other Services      Precautions / Restrictions Precautions Precautions: Fall Precaution Comments: advanced dementia Restrictions Weight Bearing Restrictions: No       Mobility Bed Mobility               General bed mobility comments: Deferred due to somnolence and recommendations of +2     Transfers                         Balance                                           ADL either performed or assessed with clinical judgement   ADL       Grooming: Maximal assistance;Wash/dry face;Bed level Grooming Details (indicate cue type and reason): max hand over hand assist to RT hand with elbow support. Pt was able to verbalize that she prefers to use her RT hand.  Max cues to reach all parts of face but not following.                     Toileting- Clothing Manipulation and Hygiene: Total assistance;Bed level Toileting - Clothing Manipulation Details (indicate cue type and reason): pure wick            Extremity/Trunk Assessment Upper Extremity Assessment Upper Extremity Assessment: Right hand dominant;RUE deficits/detail;LUE deficits/detail (favors right UE for tasks. RT UE edema noted, elevated on pillow.) RUE Deficits / Details: Hand flexion contractures beginning with high tone and pt with grimace whenb extending digitrs but able to do so. Rests in claw hand. Tolerated AAROM to 110* but did not continue and required PROM. Elbow very stiff with lacking of flexion and extension. RUE: Shoulder pain with ROM RUE Coordination: decreased fine motor;decreased gross motor LUE Deficits / Details: PROM only to LUE with pt very stiff at first in shoulder and elbow, then loosened as PROM continued gently with verbal cues. Tolerated shoulder FF to ~120 without  pain. Elbow very stiff but able to improve flex/ext with reps. Hand with high tone flexion to digits. BAble to extend passively. LUE Coordination: decreased fine motor;decreased gross motor            Vision   Additional Comments: Max cues for pt to attend to RT visual field during ADLs and AAROM exercises, however pt keeping gaze to LT.  Not following instructions well so may be confusion-based vs inattention.   Perception Perception Perception: Impaired Preception Impairment  Details: Inattention/Neglect   Praxis Praxis Praxis: Impaired Praxis Impairment Details: Initiation;Ideomotor    Cognition Arousal: Lethargic Behavior During Therapy: Flat affect Overall Cognitive Status: History of cognitive impairments - at baseline Area of Impairment: Orientation, Attention, Memory, Following commands, Safety/judgement, Awareness, Problem solving                 Orientation Level: Disoriented to, Place, Time, Situation (Unable to provide DOB) Current Attention Level: Focused Memory: Decreased recall of precautions, Decreased short-term memory Following Commands: Follows one step commands inconsistently Safety/Judgement: Decreased awareness of safety, Decreased awareness of deficits Awareness: Intellectual Problem Solving: Slow processing, Decreased initiation, Requires tactile cues, Requires verbal cues          Exercises Other Exercises Other Exercises: P/AAROM to BUEs all joints, planes x 10 reps each within range of pt tolerance.    Shoulder Instructions       General Comments      Pertinent Vitals/ Pain       Pain Assessment Pain Assessment: PAINAD Breathing: normal Negative Vocalization: none Facial Expression: sad, frightened, frown Body Language: tense, distressed pacing, fidgeting Consolability: no need to console PAINAD Score: 2 Pain Intervention(s): Limited activity within patient's tolerance, Monitored during session, Repositioned (Pt would tense up with AAROM but relaxed with cues/time.)  Home Living                                          Prior Functioning/Environment              Frequency  Min 1X/week        Progress Toward Goals  OT Goals(current goals can now be found in the care plan section)  Progress towards OT goals: Not progressing toward goals - comment  Acute Rehab OT Goals OT Goal Formulation: Patient unable to participate in goal setting Time For Goal Achievement:  09/08/22 Potential to Achieve Goals: Poor  Plan      Co-evaluation                 AM-PAC OT "6 Clicks" Daily Activity     Outcome Measure   Help from another person eating meals?: Total Help from another person taking care of personal grooming?: A Lot Help from another person toileting, which includes using toliet, bedpan, or urinal?: Total Help from another person bathing (including washing, rinsing, drying)?: Total Help from another person to put on and taking off regular upper body clothing?: Total Help from another person to put on and taking off regular lower body clothing?: Total 6 Click Score: 7    End of Session    OT Visit Diagnosis: Unsteadiness on feet (R26.81);Muscle weakness (generalized) (M62.81);Other symptoms and signs involving cognitive function   Activity Tolerance Patient limited by lethargy   Patient Left in bed;with bed alarm set;with call bell/phone within reach   Nurse Communication  Time: 1610-9604 OT Time Calculation (min): 14 min  Charges: OT General Charges $OT Visit: 1 Visit OT Treatments $Therapeutic Exercise: 8-22 mins  Victorino Dike, OT Acute Rehab Services Office: [phone] 08/28/2022  Theodoro Clock 08/28/2022, 9:45 AM

## 2022-08-29 DIAGNOSIS — E860 Dehydration: Secondary | ICD-10-CM | POA: Diagnosis not present

## 2022-08-29 DIAGNOSIS — G9341 Metabolic encephalopathy: Secondary | ICD-10-CM | POA: Diagnosis not present

## 2022-08-29 DIAGNOSIS — N179 Acute kidney failure, unspecified: Secondary | ICD-10-CM | POA: Diagnosis not present

## 2022-08-29 DIAGNOSIS — E870 Hyperosmolality and hypernatremia: Secondary | ICD-10-CM | POA: Diagnosis not present

## 2022-08-29 LAB — GLUCOSE, CAPILLARY
Glucose-Capillary: 191 mg/dL — ABNORMAL HIGH (ref 70–99)
Glucose-Capillary: 191 mg/dL — ABNORMAL HIGH (ref 70–99)
Glucose-Capillary: 213 mg/dL — ABNORMAL HIGH (ref 70–99)
Glucose-Capillary: 235 mg/dL — ABNORMAL HIGH (ref 70–99)
Glucose-Capillary: 276 mg/dL — ABNORMAL HIGH (ref 70–99)
Glucose-Capillary: 300 mg/dL — ABNORMAL HIGH (ref 70–99)

## 2022-08-29 LAB — BASIC METABOLIC PANEL
Anion gap: 14 (ref 5–15)
BUN: 20 mg/dL (ref 8–23)
CO2: 20 mmol/L — ABNORMAL LOW (ref 22–32)
Calcium: 8 mg/dL — ABNORMAL LOW (ref 8.9–10.3)
Chloride: 108 mmol/L (ref 98–111)
Creatinine, Ser: 1.02 mg/dL — ABNORMAL HIGH (ref 0.44–1.00)
GFR, Estimated: 54 mL/min — ABNORMAL LOW (ref >60)
Glucose, Bld: 247 mg/dL — ABNORMAL HIGH (ref 70–99)
Potassium: 3.1 mmol/L — ABNORMAL LOW (ref 3.5–5.1)
Sodium: 142 mmol/L (ref 135–145)

## 2022-08-29 LAB — PHOSPHORUS: Phosphorus: 2.3 mg/dL — ABNORMAL LOW (ref 2.5–4.6)

## 2022-08-29 MED ORDER — POTASSIUM CHLORIDE 10 MEQ/100ML IV SOLN
10.0000 meq | INTRAVENOUS | Status: AC
  Administered 2022-08-29 (×4): 10 meq via INTRAVENOUS
  Filled 2022-08-29 (×4): qty 100

## 2022-08-29 MED ORDER — ALUM & MAG HYDROXIDE-SIMETH 200-200-20 MG/5ML PO SUSP
30.0000 mL | Freq: Four times a day (QID) | ORAL | Status: DC | PRN
  Administered 2022-08-29: 30 mL via ORAL
  Filled 2022-08-29: qty 30

## 2022-08-29 MED ORDER — ENOXAPARIN SODIUM 40 MG/0.4ML IJ SOSY
40.0000 mg | PREFILLED_SYRINGE | INTRAMUSCULAR | Status: DC
  Administered 2022-08-29 – 2022-08-30 (×2): 40 mg via SUBCUTANEOUS
  Filled 2022-08-29 (×2): qty 0.4

## 2022-08-29 NOTE — Progress Notes (Signed)
[facility]  PROGRESS NOTE  Allison [name]:[id number] DOB: 01/25/1934 DOA: 08/24/2022 PCP: [facility]   Brief HPI:   88 yo female with PMH HTN, HLD, CVA, DM II, hypothyroidism, dementia who presented with altered mental status. Workup was concerning for UTI and hypernatremia; she was started on antibiotics and IVF.     Assessment/Plan:   Sepsis secondary to UTI/pneumonia -Sepsis physiology has resolved -WBC still elevated 26,000; WBC is down to 19,000 -She is afebrile, heart rate normal, blood pressure is stable  ?  UTI -Urine culture grew multiple species -Currently on vancomycin and cefepime -Vancomycin has been discontinued  Pneumonia -Continue vancomycin/cefepime -Blood cultures x 2 are negative to date -Treat for total 5 days of antibiotics -Will discontinue vancomycin and continue with cefepime  Hypernatremia/AKI -Insetting of poor p.o. intake -Resolved, sodium 139 -Discontinue D5W -Creatinine improved to 1.11  Acute metabolic encephalopathy/possible dementia -Delirium precautions -Speech therapy evaluation obtained, continue dysphagia 1 diet  Hypokalemia -Potassium is still low at 3.1 -Replace potassium and follow BMP in am  Hypophosphatemia -Replete  Diabetes mellitus type 2 -CBG well-controlled -Continue NPH insulin 10 units subcu twice daily, sliding scale insulin with NovoLog  Hypertension -Blood pressure well-controlled -Continue amlodipine  Hyperlipidemia -Continue atorvastatin  Hypothyroidism -Continue Synthroid TSH 7.686, T4  0.91 on 8/5 -Follow TSH as outpatient  Goals of care -Palliative care consulted -Family opting for full scope of care  Medications     amLODipine  10 mg Oral Daily   aspirin EC  81 mg Oral Daily   atorvastatin  40 mg Oral Daily   enoxaparin (LOVENOX) injection  40 mg Subcutaneous Q24H   feeding supplement (GLUCERNA SHAKE)  237 mL Oral TID BM   insulin aspart   0-9 Units Subcutaneous Q4H   insulin NPH Human  10 Units Subcutaneous BID AC & HS   levothyroxine  125 mcg Oral QAC breakfast   pantoprazole  20 mg Oral Daily   polyethylene glycol  17 g Oral Daily   sodium chloride flush  3 mL Intravenous Q12H     Data Reviewed:   CBG:  Recent Labs  Lab 08/28/22 2004 08/28/22 2339 08/29/22 0358 08/29/22 0907 08/29/22 1114  GLUCAP 195* 276* 235* 213* 191*    SpO2: 97 %    Vitals:   08/28/22 1443 08/28/22 2016 08/29/22 0443 08/29/22 0925  BP: 120/65 134/81 (!) 147/78 (!) 170/78  Pulse: 92 95 89 86  Resp:  20 18 19   Temp: 98 F (36.7 C) 97.7 F (36.5 C) 98.2 F (36.8 C) (!) 97.5 F (36.4 C)  TempSrc: Oral Oral Oral   SpO2: 97% 98% 97% 97%  Weight:      Height:          Data Reviewed:  Basic Metabolic Panel: Recent Labs  Lab 08/25/22 1601 08/26/22 0931 08/27/22 0633 08/28/22 0832 08/29/22 0353  NA 155* 156* 151* 139 142  K 3.5 2.8* 2.9* 2.7* 3.1*  CL 117* 121* 117* 107 108  CO2 20* 22 23 19* 20*  GLUCOSE 415* 347* 204* 251* 247*  BUN 52* 37* 24* 18 20  CREATININE 1.70* 1.60* 1.14* 1.11* 1.02*  CALCIUM 9.5 9.5 8.5* 7.7* 8.0*  MG  --  2.0  --   --   --   PHOS  --  1.4*  --  1.7* 2.3*    CBC: Recent Labs  Lab 08/24/22 1033 08/24/22 1039 08/25/22 0716 08/26/22 0802 08/28/22 1134  WBC 22.9*  --  26.4* 26.4* 19.1*  NEUTROABS 19.7*  --   --   --   --   HGB 15.1* 15.0 16.2* 15.5* 14.8  HCT 46.9* 44.0 51.6* 46.8* 44.5  MCV 85.3  --  87.0 82.8 79.7*  PLT PLATELET CLUMPS NOTED ON SMEAR, COUNT APPEARS ADEQUATE  --  168 157 124*    LFT Recent Labs  Lab 08/24/22 1033 08/28/22 0832  AST 32  --   ALT 19  --   ALKPHOS 73  --   BILITOT 1.9*  --   PROT 6.6  --   ALBUMIN 3.0* 1.8*     Antibiotics: Anti-infectives (From admission, onward)    Start     Dose/Rate Route Frequency Ordered Stop   08/26/22 2200  vancomycin (VANCOREADY) IVPB 500 mg/100 mL  Status:  Discontinued        500 mg 100 mL/hr over 60  Minutes Intravenous Every 24 hours 08/25/22 2313 08/28/22 1651   08/26/22 1700  ceFEPIme (MAXIPIME) 2 g in sodium chloride 0.9 % 100 mL IVPB        2 g 200 mL/hr over 30 Minutes Intravenous Every 24 hours 08/25/22 1617     08/26/22 0000  vancomycin (VANCOCIN) IVPB 1000 mg/200 mL premix        1,000 mg 200 mL/hr over 60 Minutes Intravenous  Once 08/25/22 2312 08/26/22 0104   08/25/22 1700  ceFEPIme (MAXIPIME) 2 g in sodium chloride 0.9 % 100 mL IVPB        2 g 200 mL/hr over 30 Minutes Intravenous  Once 08/25/22 1610 08/25/22 2230   08/25/22 1000  cefTRIAXone (ROCEPHIN) 1 g in sodium chloride 0.9 % 100 mL IVPB  Status:  Discontinued        1 g 200 mL/hr over 30 Minutes Intravenous Every 24 hours 08/24/22 1501 08/25/22 1557   08/24/22 1415  cefTRIAXone (ROCEPHIN) 1 g in sodium chloride 0.9 % 100 mL IVPB        1 g 200 mL/hr over 30 Minutes Intravenous  Once 08/24/22 1406 08/24/22 1644        DVT prophylaxis: Lovenox  Code Status: Full code  Family Communication: No family at bedside   CONSULTS    Subjective   Denies pain.   Objective    Physical Examination:  General-appears in no acute distress Heart-S1-S2, regular, no murmur auscultated Lungs-clear to auscultation bilaterally, no wheezing or crackles auscultated Abdomen-soft, nontender, no organomegaly Extremities-no edema in the lower extremities Neuro-alert, oriented x3, no focal deficit noted   Status is: Inpatient:      Pressure Injury 08/25/22 Coccyx Bilateral Deep Tissue Pressure Injury - Purple or maroon localized area of discolored intact skin or blood-filled blister due to damage of underlying soft tissue from pressure and/or shear. (Active)  08/25/22 2330  Location: Coccyx  Location Orientation: Bilateral  Staging: Deep Tissue Pressure Injury - Purple or maroon localized area of discolored intact skin or blood-filled blister due to damage of underlying soft tissue from pressure and/or shear.  Wound  Description (Comments):   Present on Admission: Yes        Lailoni Baquera S Theora Vankirk   [facility] If 7PM-7AM, please contact night-coverage at [email or web address], Office  [phone]   08/29/2022, 11:24 AM  LOS: 5 days

## 2022-08-30 DIAGNOSIS — N179 Acute kidney failure, unspecified: Secondary | ICD-10-CM | POA: Diagnosis not present

## 2022-08-30 DIAGNOSIS — E870 Hyperosmolality and hypernatremia: Secondary | ICD-10-CM | POA: Diagnosis not present

## 2022-08-30 DIAGNOSIS — E860 Dehydration: Secondary | ICD-10-CM | POA: Diagnosis not present

## 2022-08-30 DIAGNOSIS — G9341 Metabolic encephalopathy: Secondary | ICD-10-CM | POA: Diagnosis not present

## 2022-08-30 LAB — GLUCOSE, CAPILLARY
Glucose-Capillary: 195 mg/dL — ABNORMAL HIGH (ref 70–99)
Glucose-Capillary: 168 mg/dL — ABNORMAL HIGH (ref 70–99)
Glucose-Capillary: 265 mg/dL — ABNORMAL HIGH (ref 70–99)
Glucose-Capillary: 262 mg/dL — ABNORMAL HIGH (ref 70–99)

## 2022-08-30 LAB — BASIC METABOLIC PANEL
Anion gap: 13 (ref 5–15)
BUN: 16 mg/dL (ref 8–23)
CO2: 21 mmol/L — ABNORMAL LOW (ref 22–32)
Calcium: 7.9 mg/dL — ABNORMAL LOW (ref 8.9–10.3)
Chloride: 108 mmol/L (ref 98–111)
Creatinine, Ser: 0.96 mg/dL (ref 0.44–1.00)
GFR, Estimated: 58 mL/min — ABNORMAL LOW (ref >60)
Glucose, Bld: 287 mg/dL — ABNORMAL HIGH (ref 70–99)
Potassium: 3.3 mmol/L — ABNORMAL LOW (ref 3.5–5.1)
Sodium: 142 mmol/L (ref 135–145)

## 2022-08-30 LAB — CBC
HCT: 35.2 % — ABNORMAL LOW (ref 36.0–46.0)
Hemoglobin: 11.6 g/dL — ABNORMAL LOW (ref 12.0–15.0)
MCH: 27.2 pg (ref 26.0–34.0)
MCHC: 33 g/dL (ref 30.0–36.0)
MCV: 82.6 fL (ref 80.0–100.0)
Platelets: 105 10*3/uL — ABNORMAL LOW (ref 150–400)
RBC: 4.26 MIL/uL (ref 3.87–5.11)
RDW: 15.2 % (ref 11.5–15.5)
WBC: 15 10*3/uL — ABNORMAL HIGH (ref 4.0–10.5)
nRBC: 0 % (ref 0.0–0.2)

## 2022-08-30 MED ORDER — INSULIN ASPART 100 UNIT/ML IJ SOLN
0.0000 [IU] | Freq: Three times a day (TID) | INTRAMUSCULAR | Status: DC
  Administered 2022-08-30: 5 [IU] via SUBCUTANEOUS
  Administered 2022-08-31: 3 [IU] via SUBCUTANEOUS

## 2022-08-30 MED ORDER — POTASSIUM CHLORIDE CRYS ER 20 MEQ PO TBCR
40.0000 meq | EXTENDED_RELEASE_TABLET | Freq: Once | ORAL | Status: AC
  Administered 2022-08-30: 40 meq via ORAL
  Filled 2022-08-30: qty 2

## 2022-08-30 NOTE — Progress Notes (Signed)
[facility]  PROGRESS NOTE  Allison [name]:[id number] DOB: 02-Sep-1934 DOA: 08/24/2022 PCP: [facility]   Brief HPI:   88 yo female with PMH HTN, HLD, CVA, DM II, hypothyroidism, dementia who presented with altered mental status. Workup was concerning for UTI and hypernatremia; she was started on antibiotics and IVF.     Assessment/Plan:   Sepsis secondary to UTI/pneumonia -Sepsis physiology has resolved -WBC still elevated 26,000; WBC is down to 15,000 -She is afebrile, heart rate normal, blood pressure is stable  ?  UTI -Urine culture grew multiple species -Currently on vancomycin and cefepime -Vancomycin has been discontinued -Will discontinue cefepime after today's date to complete 5 days of treatment  Pneumonia -Continue vancomycin/cefepime -Blood cultures x 2 are negative to date -Treat for total 5 days of antibiotics -Vancomycin was discontinued -Discontinue cefepime after today's days to complete 5 days of treatment as above  Hypernatremia/AKI -Insetting of poor p.o. intake -Resolved, sodium 139 -Discontinue D5W -Creatinine improved to 1.11  Acute metabolic encephalopathy/possible dementia -Delirium precautions -Speech therapy evaluation obtained, continue dysphagia 1 diet  Hypokalemia -Potassium is still low at 3.3 -Replace potassium and follow BMP in am  Hypophosphatemia -Replete  Diabetes mellitus type 2 -CBG well-controlled -Continue NPH insulin 10 units subcu twice daily, sliding scale insulin with NovoLog  Hypertension -Blood pressure well-controlled -Continue amlodipine  Hyperlipidemia -Continue atorvastatin  Hypothyroidism -Continue Synthroid TSH 7.686, T4  0.91 on 8/5 -Follow TSH as outpatient  Goals of care -Palliative care consulted -Family opting for full scope of care  Medications     amLODipine  10 mg Oral Daily   aspirin EC  81 mg Oral Daily   atorvastatin  40 mg Oral Daily    enoxaparin (LOVENOX) injection  40 mg Subcutaneous Q24H   feeding supplement (GLUCERNA SHAKE)  237 mL Oral TID BM   insulin aspart  0-9 Units Subcutaneous Q4H   insulin NPH Human  10 Units Subcutaneous BID AC & HS   levothyroxine  125 mcg Oral QAC breakfast   pantoprazole  20 mg Oral Daily   polyethylene glycol  17 g Oral Daily   potassium chloride  40 mEq Oral Once   sodium chloride flush  3 mL Intravenous Q12H     Data Reviewed:   CBG:  Recent Labs  Lab 08/29/22 1552 08/29/22 1939 08/29/22 2350 08/30/22 0348 08/30/22 0744  GLUCAP 191* 300* 276* 195* 168*    SpO2: 96 %    Vitals:   08/29/22 1553 08/29/22 1936 08/30/22 0352 08/30/22 0927  BP: (!) 148/81 (!) 146/71 (!) 180/87 (!) 157/80  Pulse: 94 97 89 95  Resp: 18 16 17    Temp: 98.4 F (36.9 C) 97.6 F (36.4 C) 98 F (36.7 C) 98.2 F (36.8 C)  TempSrc:  Oral Oral Oral  SpO2: 100% 99% 96% 96%  Weight:      Height:          Data Reviewed:  Basic Metabolic Panel: Recent Labs  Lab 08/26/22 0931 08/27/22 0633 08/28/22 0832 08/29/22 0353 08/30/22 0049  NA 156* 151* 139 142 142  K 2.8* 2.9* 2.7* 3.1* 3.3*  CL 121* 117* 107 108 108  CO2 22 23 19* 20* 21*  GLUCOSE 347* 204* 251* 247* 287*  BUN 37* 24* 18 20 16   CREATININE 1.60* 1.14* 1.11* 1.02* 0.96  CALCIUM 9.5 8.5* 7.7* 8.0* 7.9*  MG 2.0  --   --   --   --   PHOS  1.4*  --  1.7* 2.3*  --     CBC: Recent Labs  Lab 08/24/22 1033 08/24/22 1039 08/25/22 0716 08/26/22 0802 08/28/22 1134 08/30/22 0049  WBC 22.9*  --  26.4* 26.4* 19.1* 15.0*  NEUTROABS 19.7*  --   --   --   --   --   HGB 15.1* 15.0 16.2* 15.5* 14.8 11.6*  HCT 46.9* 44.0 51.6* 46.8* 44.5 35.2*  MCV 85.3  --  87.0 82.8 79.7* 82.6  PLT PLATELET CLUMPS NOTED ON SMEAR, COUNT APPEARS ADEQUATE  --  168 157 124* 105*    LFT Recent Labs  Lab 08/24/22 1033 08/28/22 0832  AST 32  --   ALT 19  --   ALKPHOS 73  --   BILITOT 1.9*  --   PROT 6.6  --   ALBUMIN 3.0* 1.8*      Antibiotics: Anti-infectives (From admission, onward)    Start     Dose/Rate Route Frequency Ordered Stop   08/26/22 2200  vancomycin (VANCOREADY) IVPB 500 mg/100 mL  Status:  Discontinued        500 mg 100 mL/hr over 60 Minutes Intravenous Every 24 hours 08/25/22 2313 08/28/22 1651   08/26/22 1700  ceFEPIme (MAXIPIME) 2 g in sodium chloride 0.9 % 100 mL IVPB        2 g 200 mL/hr over 30 Minutes Intravenous Every 24 hours 08/25/22 1617 08/30/22 2359   08/26/22 0000  vancomycin (VANCOCIN) IVPB 1000 mg/200 mL premix        1,000 mg 200 mL/hr over 60 Minutes Intravenous  Once 08/25/22 2312 08/26/22 0104   08/25/22 1700  ceFEPIme (MAXIPIME) 2 g in sodium chloride 0.9 % 100 mL IVPB        2 g 200 mL/hr over 30 Minutes Intravenous  Once 08/25/22 1610 08/25/22 2230   08/25/22 1000  cefTRIAXone (ROCEPHIN) 1 g in sodium chloride 0.9 % 100 mL IVPB  Status:  Discontinued        1 g 200 mL/hr over 30 Minutes Intravenous Every 24 hours 08/24/22 1501 08/25/22 1557   08/24/22 1415  cefTRIAXone (ROCEPHIN) 1 g in sodium chloride 0.9 % 100 mL IVPB        1 g 200 mL/hr over 30 Minutes Intravenous  Once 08/24/22 1406 08/24/22 1644        DVT prophylaxis: Lovenox  Code Status: Full code  Family Communication: No family at bedside   CONSULTS    Subjective   Denies any complaints.  Good p.o. intake.  Objective    Physical Examination:   General-appears in no acute distress Heart-S1-S2, regular, no murmur auscultated Lungs-clear to auscultation bilaterally, no wheezing or crackles auscultated Abdomen-soft, nontender, no organomegaly Extremities-no edema in the lower extremities Neuro-alert, oriented x3, no focal deficit noted  Status is: Inpatient:      Pressure Injury 08/25/22 Coccyx Bilateral Deep Tissue Pressure Injury - Purple or maroon localized area of discolored intact skin or blood-filled blister due to damage of underlying soft tissue from pressure and/or shear. (Active)   08/25/22 2330  Location: Coccyx  Location Orientation: Bilateral  Staging: Deep Tissue Pressure Injury - Purple or maroon localized area of discolored intact skin or blood-filled blister due to damage of underlying soft tissue from pressure and/or shear.  Wound Description (Comments):   Present on Admission: Yes        Betul Brisky S Taneal Sonntag   [facility] If 7PM-7AM, please contact night-coverage at [email or web address], Office  [phone]  08/30/2022, 10:52 AM  LOS: 6 days

## 2022-08-31 DIAGNOSIS — E870 Hyperosmolality and hypernatremia: Secondary | ICD-10-CM | POA: Diagnosis not present

## 2022-08-31 DIAGNOSIS — N179 Acute kidney failure, unspecified: Secondary | ICD-10-CM | POA: Diagnosis not present

## 2022-08-31 DIAGNOSIS — E860 Dehydration: Secondary | ICD-10-CM | POA: Diagnosis not present

## 2022-08-31 DIAGNOSIS — G9340 Encephalopathy, unspecified: Secondary | ICD-10-CM | POA: Diagnosis not present

## 2022-08-31 LAB — BASIC METABOLIC PANEL
Anion gap: 11 (ref 5–15)
BUN: 11 mg/dL (ref 8–23)
CO2: 23 mmol/L (ref 22–32)
Calcium: 8.2 mg/dL — ABNORMAL LOW (ref 8.9–10.3)
Chloride: 110 mmol/L (ref 98–111)
Creatinine, Ser: 0.93 mg/dL (ref 0.44–1.00)
GFR, Estimated: >60 mL/min (ref >60)
Glucose, Bld: 165 mg/dL — ABNORMAL HIGH (ref 70–99)
Potassium: 3.4 mmol/L — ABNORMAL LOW (ref 3.5–5.1)
Sodium: 144 mmol/L (ref 135–145)

## 2022-08-31 LAB — GLUCOSE, CAPILLARY
Glucose-Capillary: 80 mg/dL (ref 70–99)
Glucose-Capillary: 220 mg/dL — ABNORMAL HIGH (ref 70–99)

## 2022-08-31 MED ORDER — INSULIN ASPART PROT & ASPART (70-30 MIX) 100 UNIT/ML PEN: 10.0000 [IU] | PEN_INJECTOR | Freq: Two times a day (BID) | SUBCUTANEOUS | 11 refills | Status: AC

## 2022-08-31 MED ORDER — POTASSIUM CHLORIDE CRYS ER 20 MEQ PO TBCR
40.0000 meq | EXTENDED_RELEASE_TABLET | Freq: Once | ORAL | Status: AC
  Administered 2022-08-31: 40 meq via ORAL
  Filled 2022-08-31: qty 2

## 2022-08-31 NOTE — Progress Notes (Signed)
PT is in a calm stable condition Discharged to [facility] transfer services to go to [facility]. All monitors and IV's have been removed. All belongings have been sent with pt including dentures and a silver ring.

## 2022-08-31 NOTE — TOC Transition Note (Signed)
Transition of Care [facility]) - CM/SW Discharge Note   Patient Details  Name: Allison [name] MRN: [id number] Date of Birth: 08-19-1934  Transition of Care [facility] CM/SW Contact:  Carley Hammed, LCSW Phone Number: 08/31/2022, 11:53 AM   Clinical Narrative:    Pt to be transported to [facility] via [facility]. Nurse to call report to [phone].   Final next level of care: Skilled Nursing Facility Barriers to Discharge: Barriers Resolved   Patient Goals and CMS Choice      Discharge Placement                Patient chooses bed at: [facility] Patient to be transferred to facility by: [facility] Name of family member notified: VM for Hali Marry Patient and family notified of of transfer: 08/31/22  Discharge Plan and Services Additional resources added to the After Visit Summary for                                       Social Determinants of Health (SDOH) Interventions SDOH Screenings   Food Insecurity: No Food Insecurity (08/24/2022)  Housing: Low Risk  (08/24/2022)  Transportation Needs: No Transportation Needs (08/24/2022)  Utilities: Not At Risk (08/24/2022)  Alcohol Screen: Low Risk  (10/11/2017)  Financial Resource Strain: Low Risk  (06/28/2017)  Physical Activity: Insufficiently Active (06/28/2017)  Social Connections: Somewhat Isolated (06/28/2017)  Stress: Stress Concern Present (06/28/2017)  Tobacco Use: Low Risk  (08/25/2022)     Readmission Risk Interventions    08/18/2022   12:38 PM  Readmission Risk Prevention Plan  Transportation Screening Complete  PCP or Specialist Appt within 5-7 Days Complete  Home Care Screening Complete  Medication Review (RN CM) Complete

## 2022-08-31 NOTE — Discharge Summary (Signed)
Physician Discharge Summary   Patient: Allison [name] MRN: [id number] DOB: 11/24/1934  Admit date:     08/24/2022  Discharge date: 08/31/22  Discharge Physician: Meredeth Ide   PCP: [facility]   Recommendations at discharge:   Patient be discharged to skilled nursing facility Follow-up PCP in 2 weeks  Discharge Diagnoses: Active Problems:   Sepsis secondary to UTI (HCC)   Dehydration   AKI (acute kidney injury) (HCC)   Hypernatremia   Alzheimer's dementia (HCC)   Acute metabolic encephalopathy   Hypothyroidism   Dyslipidemia   Essential hypertension   GERD   Uncontrolled type 2 diabetes mellitus with hyperglycemia, without long-term current use of insulin (HCC)   Malnutrition of moderate degree  Resolved Problems:   * No resolved hospital problems. *  Hospital Course: 88 yo female with PMH HTN, HLD, CVA, DM II, hypothyroidism, dementia who presented with altered mental status. Workup was concerning for UTI and hypernatremia; she was started on antibiotics and IVF.    Assessment and Plan:  Sepsis secondary to UTI/pneumonia -Sepsis physiology has resolved -WBC down to 15,000 -She is afebrile, heart rate normal, blood pressure is stable   ?  UTI -Urine culture grew multiple species -Started on vancomycin and cefepime -Completed 5 days of antibiotics in the hospital   Pneumonia -Continue vancomycin/cefepime -Blood cultures x 2 are negative to date -Treated  for total 5 days of antibiotics -   Hypernatremia/AKI -Insetting of poor p.o. intake -Resolved, sodium 144 -Encourage p.o. intake    Acute metabolic encephalopathy/possible dementia -Delirium precautions -Speech therapy evaluation obtained, continue dysphagia 1 diet   Hypokalemia -Potassium is 3.4 this morning -Replace potassium and follow BMP in am   Hypophosphatemia -Replete   Diabetes mellitus type 2 -CBG well-controlled -Cut down home insulin of 70/30 to  10 units subcu twice daily   Hypertension -Blood pressure well-controlled -Continue amlodipine   Hyperlipidemia -Continue atorvastatin   Hypothyroidism -Continue Synthroid TSH 7.686, T4  0.91 on 8/5 -Follow TSH as outpatient   Goals of care -Palliative care consulted -Family opting for full scope of care          Consultants:  Procedures performed:  Disposition: Home Diet recommendation:  Discharge Diet Orders (From admission, onward)     Start     Ordered   08/31/22 0000  Diet - low sodium heart healthy        08/31/22 1037           Regular diet DISCHARGE MEDICATION: Allergies as of 08/31/2022       Reactions   Lantus [insulin Glargine] Itching, Swelling   angioedema   Levemir [insulin Detemir] Itching, Swelling   angioedema   Pineapple Anaphylaxis   Not listed on the [facility]   Codeine Other (See Comments)   Unknown reaction per [facility]   Invokana [canagliflozin] Other (See Comments)   Constipation, nightmares Not listed on the [facility]   Penicillins Other (See Comments)   Unknown reaction per MAR   Sulfa Antibiotics Other (See Comments)   Unknown reaction per [facility]   Tradjenta [linagliptin] Other (See Comments)   Unknown   Vioxx [rofecoxib] Other (See Comments)   Unknown reaction per MAR   Exelon [rivastigmine] Rash        Medication List     TAKE these medications    acetaminophen 500 MG tablet Commonly known as: TYLENOL Take 500 mg by mouth daily as needed for mild pain.   amLODipine 10 MG  tablet Commonly known as: NORVASC Take 1 tablet (10 mg total) by mouth daily.   aspirin EC 81 MG tablet Take 1 tablet (81 mg total) by mouth daily. Swallow whole.   atorvastatin 40 MG tablet Commonly known as: LIPITOR Take 1 tablet (40 mg total) by mouth daily.   Blood Glucose Monitoring Suppl Devi 1 each by Does not apply route in the morning, at noon, and at bedtime. May substitute to any manufacturer covered by patient's insurance.   BLOOD GLUCOSE  TEST STRIPS Strp 1 each by In Vitro route 3 (three) times daily before meals. May substitute to any manufacturer covered by patient's insurance.   calcium carbonate 750 MG chewable tablet Commonly known as: TUMS EX Chew 2 tablets by mouth daily as needed (indigestion).   insulin aspart protamine - aspart (70-30) 100 UNIT/ML FlexPen Commonly known as: NOVOLOG 70/30 MIX Inject 10 Units into the skin 2 (two) times daily with a meal. What changed: how much to take   Lancet Device Misc 1 each by Does not apply route 3 (three) times daily before meals. May substitute to any manufacturer covered by patient's insurance.   levothyroxine 125 MCG tablet Commonly known as: SYNTHROID Take 1 tablet (125 mcg total) by mouth daily before breakfast.   pantoprazole 20 MG tablet Commonly known as: PROTONIX Take 1 tablet (20 mg total) by mouth daily.   Pen Needles 3/16" 31G X 5 MM Misc 1 Pen by Does not apply route in the morning and at bedtime.   Voltaren Arthritis Pain 1 % Gel Generic drug: diclofenac Sodium Apply 2 g topically daily as needed (for pain).        Discharge Exam: Filed Weights   08/24/22 1631  Weight: 64.1 kg   General-appears in no acute distress Heart-S1-S2, regular, no murmur auscultated Lungs-clear to auscultation bilaterally, no wheezing or crackles auscultated Abdomen-soft, nontender, no organomegaly Extremities-no edema in the lower extremities Neuro-alert, oriented x3, no focal deficit noted  Condition at discharge: good  The results of significant diagnostics from this hospitalization (including imaging, microbiology, ancillary and laboratory) are listed below for reference.   Imaging Studies: CT CHEST WO CONTRAST  Result Date: 08/25/2022 CLINICAL DATA:  Left lung base patchy opacities on prior chest radiograph EXAM: CT CHEST WITHOUT CONTRAST TECHNIQUE: Multidetector CT imaging of the chest was performed following the standard protocol without IV contrast.  RADIATION DOSE REDUCTION: This exam was performed according to the departmental dose-optimization program which includes automated exposure control, adjustment of the mA and/or kV according to patient size and/or use of iterative reconstruction technique. COMPARISON:  Chest radiograph dated 08/24/2022, CT chest dated 05/01/2019 FINDINGS: Cardiovascular: Normal heart size. No significant pericardial fluid/thickening. Great vessels are normal in course and caliber. Coronary artery calcifications and aortic atherosclerosis. Mediastinum/Nodes: Imaged thyroid gland without nodules meeting criteria for imaging follow-up by size. Normal esophagus. No pathologically enlarged axillary, supraclavicular, mediastinal, or hilar lymph nodes. Lungs/Pleura: The central airways are patent. Dependent density within the distal trachea, likely secretions. Bilateral lower lobe subsegmental atelectasis and irregular consolidations, right-greater-than-left with surrounding patchy ground-glass opacities. Unchanged scattered pulmonary nodules measuring up to 6 mm (4:97), likely benign. No specific follow-up imaging recommended. No pneumothorax. No pleural effusion. Upper abdomen: Please see separately dictated CTA abdomen and pelvis report for detailed findings. Musculoskeletal: No acute or abnormal lytic or blastic osseous lesions. Multilevel degenerative changes of the thoracic spine. IMPRESSION: 1. Bilateral lower lobe subsegmental atelectasis and irregular consolidations, right-greater-than-left with surrounding patchy ground-glass opacities, suspicious for multifocal  pneumonia. 2. Aortic Atherosclerosis (ICD10-I70.0). Coronary artery calcifications. Assessment for potential risk factor modification, dietary therapy or pharmacologic therapy may be warranted, if clinically indicated. Electronically Signed   By: Agustin Cree M.D.   On: 08/25/2022 19:51   CT RENAL STONE STUDY  Result Date: 08/25/2022 CLINICAL DATA:  Sepsis EXAM: CT  ABDOMEN AND PELVIS WITHOUT CONTRAST TECHNIQUE: Multidetector CT imaging of the abdomen and pelvis was performed following the standard protocol without IV contrast. RADIATION DOSE REDUCTION: This exam was performed according to the departmental dose-optimization program which includes automated exposure control, adjustment of the mA and/or kV according to patient size and/or use of iterative reconstruction technique. COMPARISON:  07/18/17 FINDINGS: Lower chest: Bibasilar atelectatic changes are noted. Stable nodules are seen in the right middle lobe similar to that noted on the prior exam. Given their long-term stability no further follow-up is recommended. Hepatobiliary: No focal liver abnormality is seen. No gallstones, gallbladder wall thickening, or biliary dilatation. Pancreas: Unremarkable. No pancreatic ductal dilatation or surrounding inflammatory changes. Spleen: Normal in size without focal abnormality. Adrenals/Urinary Tract: Adrenal glands are within normal limits. Kidneys are well visualized bilaterally. No renal calculi or obstructive changes are noted. Rounded cyst is noted in the posterior aspect of the right kidney stable from the prior exam. No follow-up is recommended. The bladder is partially distended. Stomach/Bowel: Scattered diverticular change of the colon is noted without evidence of diverticulitis. Appendix is within normal limits. Small bowel and stomach are unremarkable. Vascular/Lymphatic: Aortic atherosclerosis. No enlarged abdominal or pelvic lymph nodes. Reproductive: Status post hysterectomy. No adnexal masses. Other: No abdominal wall hernia or abnormality. No abdominopelvic ascites. Musculoskeletal: Degenerative changes of lumbar spine are noted. IMPRESSION: Diverticulosis without diverticulitis. Bibasilar atelectatic changes. No other acute abnormality noted. Electronically Signed   By: Alcide Clever M.D.   On: 08/25/2022 19:50   DG Chest Portable 1 View  Result Date:  08/24/2022 CLINICAL DATA:  Provided history: Altered mental status. EXAM: PORTABLE CHEST 1 VIEW COMPARISON:  Prior chest radiographs 08/15/2022 and earlier. Chest CT 05/01/2019. FINDINGS: Heart size within normal limits. Aortic atherosclerosis. Known right lower lobe pulmonary nodule unchanged from the recent prior chest radiograph of 08/15/2022. Subtle ill-defined opacity within the left lung base. No evidence of pleural effusion or pneumothorax. No acute osseous abnormality identified. Spondylosis and dextrocurvature of the thoracic spine. IMPRESSION: 1. Shallow inspiration radiograph. 2. Subtle ill-defined opacity within the left lung base, which may reflect atelectasis or infection. 3. Aortic Atherosclerosis (ICD10-I70.0). Electronically Signed   By: Jackey Loge D.O.   On: 08/24/2022 11:01   DG Chest Portable 1 View  Result Date: 08/15/2022 CLINICAL DATA:  Altered mental status EXAM: PORTABLE CHEST 1 VIEW COMPARISON:  01/10/2022 FINDINGS: Lung volumes are small and there is bibasilar atelectasis. No pneumothorax or pleural effusion. Cardiac size within normal limits. Pulmonary vascularity is normal. No acute bone abnormality. IMPRESSION: 1. Pulmonary hypoinflation. Electronically Signed   By: Helyn Numbers M.D.   On: 08/15/2022 21:17    Microbiology: Results for orders placed or performed during the hospital encounter of 08/24/22  Urine Culture     Status: Abnormal   Collection Time: 08/24/22 10:08 AM   Specimen: Urine, Random  Result Value Ref Range Status   Specimen Description URINE, RANDOM  Final   Special Requests   Final    NONE Reflexed from [id number] Performed at [facility], [address] [place], [place] [address]    Culture MULTIPLE SPECIES PRESENT, SUGGEST RECOLLECTION (A)  Final  Report Status 08/26/2022 FINAL  Final  Culture, blood (routine x 2)     Status: None   Collection Time: 08/24/22  5:04 PM   Specimen: BLOOD RIGHT HAND  Result Value Ref Range Status   Specimen  Description BLOOD RIGHT HAND  Final   Special Requests   Final    BOTTLES DRAWN AEROBIC ONLY Blood Culture adequate volume   Culture   Final    NO GROWTH 5 DAYS Performed at [facility], [address] [place], [place] [address]    Report Status 08/29/2022 FINAL  Final  Culture, blood (routine x 2)     Status: None   Collection Time: 08/24/22  8:56 PM   Specimen: BLOOD  Result Value Ref Range Status   Specimen Description BLOOD SITE NOT SPECIFIED  Final   Special Requests   Final    BOTTLES DRAWN AEROBIC AND ANAEROBIC Blood Culture results may not be optimal due to an inadequate volume of blood received in culture bottles   Culture   Final    NO GROWTH 5 DAYS Performed at [facility], [address], [place], [place] [address]    Report Status 08/29/2022 FINAL  Final  MRSA Next Gen by PCR, Nasal     Status: None   Collection Time: 08/26/22 12:18 AM   Specimen: Nasal Mucosa; Nasal Swab  Result Value Ref Range Status   MRSA by PCR Next Gen NOT DETECTED NOT DETECTED Final    Comment: (NOTE) The GeneXpert MRSA Assay (FDA approved for NASAL specimens only), is one component of a comprehensive MRSA colonization surveillance program. It is not intended to diagnose MRSA infection nor to guide or monitor treatment for MRSA infections. Test performance is not FDA approved in patients less than 98 years old. Performed at [facility], [address] [place], [place] [address]   Respiratory (~20 pathogens) panel by PCR     Status: None   Collection Time: 08/26/22 12:18 AM   Specimen: Nasopharyngeal Swab; Respiratory  Result Value Ref Range Status   Adenovirus NOT DETECTED NOT DETECTED Final   Coronavirus 229E NOT DETECTED NOT DETECTED Final    Comment: (NOTE) The Coronavirus on the Respiratory Panel, DOES NOT test for the novel  Coronavirus (2019 nCoV)    Coronavirus HKU1 NOT DETECTED NOT DETECTED Final   Coronavirus NL63 NOT DETECTED NOT DETECTED Final    Coronavirus OC43 NOT DETECTED NOT DETECTED Final   Metapneumovirus NOT DETECTED NOT DETECTED Final   Rhinovirus / Enterovirus NOT DETECTED NOT DETECTED Final   Influenza A NOT DETECTED NOT DETECTED Final   Influenza B NOT DETECTED NOT DETECTED Final   Parainfluenza Virus 1 NOT DETECTED NOT DETECTED Final   Parainfluenza Virus 2 NOT DETECTED NOT DETECTED Final   Parainfluenza Virus 3 NOT DETECTED NOT DETECTED Final   Parainfluenza Virus 4 NOT DETECTED NOT DETECTED Final   Respiratory Syncytial Virus NOT DETECTED NOT DETECTED Final   Bordetella pertussis NOT DETECTED NOT DETECTED Final   Bordetella Parapertussis NOT DETECTED NOT DETECTED Final   Chlamydophila pneumoniae NOT DETECTED NOT DETECTED Final   Mycoplasma pneumoniae NOT DETECTED NOT DETECTED Final    Comment: Performed at [facility], [address] [place], [place] [address]  SARS Coronavirus 2 by RT PCR (hospital order, performed in [facility] hospital lab) *cepheid single result test* Nasal Mucosa     Status: None   Collection Time: 08/26/22 12:18 AM   Specimen: Nasal Mucosa;  Nasal Swab  Result Value Ref Range Status   SARS Coronavirus 2 by RT PCR NEGATIVE NEGATIVE Final    Comment: Performed at [facility], [address], [place], [place] [address]    Labs: CBC: Recent Labs  Lab 08/24/22 1039 08/25/22 0716 08/26/22 0802 08/28/22 1134 08/30/22 0049  WBC  --  26.4* 26.4* 19.1* 15.0*  HGB 15.0 16.2* 15.5* 14.8 11.6*  HCT 44.0 51.6* 46.8* 44.5 35.2*  MCV  --  87.0 82.8 79.7* 82.6  PLT  --  168 157 124* 105*   Basic Metabolic Panel: Recent Labs  Lab 08/26/22 0931 08/27/22 0633 08/28/22 0832 08/29/22 0353 08/30/22 0049 08/31/22 0122  NA 156* 151* 139 142 142 144  K 2.8* 2.9* 2.7* 3.1* 3.3* 3.4*  CL 121* 117* 107 108 108 110  CO2 22 23 19* 20* 21* 23  GLUCOSE 347* 204* 251* 247* 287* 165*  BUN 37* 24* 18 20 16 11   CREATININE 1.60* 1.14* 1.11* 1.02* 0.96 0.93  CALCIUM 9.5 8.5* 7.7* 8.0* 7.9*  8.2*  MG 2.0  --   --   --   --   --   PHOS 1.4*  --  1.7* 2.3*  --   --    Liver Function Tests: Recent Labs  Lab 08/28/22 0832  ALBUMIN 1.8*   CBG: Recent Labs  Lab 08/30/22 0348 08/30/22 0744 08/30/22 1126 08/30/22 2123 08/31/22 0744  GLUCAP 195* 168* 265* 262* 80    Discharge time spent: greater than 30 minutes.  Signed: Meredeth Ide, MD [facility] 08/31/2022

## 2022-08-31 NOTE — Progress Notes (Signed)
Report called to [facility]. Pt will be going to [facility].

## 2022-08-31 NOTE — Progress Notes (Signed)
Attempted several times to give synthroid but whenever patient opens her eyes and was told that this nurse is giving her synthroid, she suddenly closes her eyes back and purses her lips and will not open.several attempts made but failed.

## 2022-08-31 NOTE — Plan of Care (Signed)
  Problem: Metabolic: Goal: Ability to maintain appropriate glucose levels will improve Outcome: Not Progressing   Problem: Nutritional: Goal: Maintenance of adequate nutrition will improve Outcome: Not Progressing   Problem: Skin Integrity: Goal: Risk for impaired skin integrity will decrease Outcome: Not Progressing   Problem: Clinical Measurements: Goal: Will remain free from infection Outcome: Not Progressing

## 2022-11-21 ENCOUNTER — Emergency Department (HOSPITAL_COMMUNITY): Payer: Medicare (Managed Care)

## 2022-11-21 ENCOUNTER — Encounter (HOSPITAL_COMMUNITY): Payer: Self-pay

## 2022-11-21 ENCOUNTER — Inpatient Hospital Stay (HOSPITAL_COMMUNITY)
Admission: EM | Admit: 2022-11-21 | Discharge: 2022-11-25 | DRG: 871 | Disposition: A | Discharge status: Skilled Nursing Facility | Payer: Medicare (Managed Care) | Source: Skilled Nursing Facility | Attending: Internal Medicine | Admitting: Internal Medicine

## 2022-11-21 DIAGNOSIS — Z825 Family history of asthma and other chronic lower respiratory diseases: Secondary | ICD-10-CM

## 2022-11-21 DIAGNOSIS — Z885 Allergy status to narcotic agent status: Secondary | ICD-10-CM

## 2022-11-21 DIAGNOSIS — Z794 Long term (current) use of insulin: Secondary | ICD-10-CM

## 2022-11-21 DIAGNOSIS — R4182 Altered mental status, unspecified: Principal | ICD-10-CM

## 2022-11-21 DIAGNOSIS — Z8673 Personal history of transient ischemic attack (TIA), and cerebral infarction without residual deficits: Secondary | ICD-10-CM

## 2022-11-21 DIAGNOSIS — E870 Hyperosmolality and hypernatremia: Secondary | ICD-10-CM | POA: Diagnosis present

## 2022-11-21 DIAGNOSIS — G309 Alzheimer's disease, unspecified: Secondary | ICD-10-CM | POA: Diagnosis present

## 2022-11-21 DIAGNOSIS — Z841 Family history of disorders of kidney and ureter: Secondary | ICD-10-CM

## 2022-11-21 DIAGNOSIS — Z888 Allergy status to other drugs, medicaments and biological substances status: Secondary | ICD-10-CM

## 2022-11-21 DIAGNOSIS — K219 Gastro-esophageal reflux disease without esophagitis: Secondary | ICD-10-CM | POA: Diagnosis present

## 2022-11-21 DIAGNOSIS — A419 Sepsis, unspecified organism: Secondary | ICD-10-CM | POA: Diagnosis not present

## 2022-11-21 DIAGNOSIS — Z882 Allergy status to sulfonamides status: Secondary | ICD-10-CM

## 2022-11-21 DIAGNOSIS — Z7982 Long term (current) use of aspirin: Secondary | ICD-10-CM

## 2022-11-21 DIAGNOSIS — Z79899 Other long term (current) drug therapy: Secondary | ICD-10-CM

## 2022-11-21 DIAGNOSIS — S0083XA Contusion of other part of head, initial encounter: Secondary | ICD-10-CM | POA: Diagnosis present

## 2022-11-21 DIAGNOSIS — Z803 Family history of malignant neoplasm of breast: Secondary | ICD-10-CM

## 2022-11-21 DIAGNOSIS — Z880 Allergy status to penicillin: Secondary | ICD-10-CM

## 2022-11-21 DIAGNOSIS — E1110 Type 2 diabetes mellitus with ketoacidosis without coma: Secondary | ICD-10-CM | POA: Diagnosis present

## 2022-11-21 DIAGNOSIS — K5900 Constipation, unspecified: Secondary | ICD-10-CM | POA: Diagnosis present

## 2022-11-21 DIAGNOSIS — Z800 Family history of malignant neoplasm of digestive organs: Secondary | ICD-10-CM

## 2022-11-21 DIAGNOSIS — E860 Dehydration: Secondary | ICD-10-CM | POA: Diagnosis present

## 2022-11-21 DIAGNOSIS — N179 Acute kidney failure, unspecified: Secondary | ICD-10-CM | POA: Diagnosis present

## 2022-11-21 DIAGNOSIS — I10 Essential (primary) hypertension: Secondary | ICD-10-CM | POA: Diagnosis present

## 2022-11-21 DIAGNOSIS — E039 Hypothyroidism, unspecified: Secondary | ICD-10-CM | POA: Diagnosis present

## 2022-11-21 DIAGNOSIS — Z83511 Family history of glaucoma: Secondary | ICD-10-CM

## 2022-11-21 DIAGNOSIS — Z79890 Hormone replacement therapy (postmenopausal): Secondary | ICD-10-CM

## 2022-11-21 DIAGNOSIS — Z91018 Allergy to other foods: Secondary | ICD-10-CM

## 2022-11-21 DIAGNOSIS — Z8249 Family history of ischemic heart disease and other diseases of the circulatory system: Secondary | ICD-10-CM

## 2022-11-21 DIAGNOSIS — E871 Hypo-osmolality and hyponatremia: Secondary | ICD-10-CM | POA: Diagnosis present

## 2022-11-21 DIAGNOSIS — Z820 Family history of epilepsy and other diseases of the nervous system: Secondary | ICD-10-CM

## 2022-11-21 DIAGNOSIS — N390 Urinary tract infection, site not specified: Secondary | ICD-10-CM | POA: Diagnosis present

## 2022-11-21 DIAGNOSIS — W06XXXA Fall from bed, initial encounter: Secondary | ICD-10-CM | POA: Diagnosis present

## 2022-11-21 DIAGNOSIS — E1165 Type 2 diabetes mellitus with hyperglycemia: Secondary | ICD-10-CM | POA: Diagnosis present

## 2022-11-21 DIAGNOSIS — J189 Pneumonia, unspecified organism: Secondary | ICD-10-CM | POA: Diagnosis present

## 2022-11-21 DIAGNOSIS — F0280 Dementia in other diseases classified elsewhere without behavioral disturbance: Secondary | ICD-10-CM | POA: Diagnosis present

## 2022-11-21 DIAGNOSIS — Z1152 Encounter for screening for COVID-19: Secondary | ICD-10-CM

## 2022-11-21 DIAGNOSIS — E785 Hyperlipidemia, unspecified: Secondary | ICD-10-CM | POA: Diagnosis present

## 2022-11-21 LAB — CBC WITH DIFFERENTIAL/PLATELET
Abs Immature Granulocytes: 0.04 10*3/uL (ref 0.00–0.07)
Basophils Absolute: 0.1 10*3/uL (ref 0.0–0.1)
Basophils Relative: 1 %
Eosinophils Absolute: 0.1 10*3/uL (ref 0.0–0.5)
Eosinophils Relative: 1 %
HCT: 39.1 % (ref 36.0–46.0)
Hemoglobin: 12 g/dL (ref 12.0–15.0)
Immature Granulocytes: 0 %
Lymphocytes Relative: 22 %
Lymphs Abs: 2.6 10*3/uL (ref 0.7–4.0)
MCH: 26.1 pg (ref 26.0–34.0)
MCHC: 30.7 g/dL (ref 30.0–36.0)
MCV: 85 fL (ref 80.0–100.0)
Monocytes Absolute: 1.7 10*3/uL — ABNORMAL HIGH (ref 0.1–1.0)
Monocytes Relative: 14 %
Neutro Abs: 7.5 10*3/uL (ref 1.7–7.7)
Neutrophils Relative %: 62 %
Platelets: 282 10*3/uL (ref 150–400)
RBC: 4.6 MIL/uL (ref 3.87–5.11)
RDW: 16.2 % — ABNORMAL HIGH (ref 11.5–15.5)
WBC: 12.1 10*3/uL — ABNORMAL HIGH (ref 4.0–10.5)
nRBC: 0 % (ref 0.0–0.2)

## 2022-11-21 LAB — COMPREHENSIVE METABOLIC PANEL
ALT: 14 U/L (ref 0–44)
AST: 20 U/L (ref 15–41)
Albumin: 3.1 g/dL — ABNORMAL LOW (ref 3.5–5.0)
Alkaline Phosphatase: 68 U/L (ref 38–126)
Anion gap: 11 (ref 5–15)
BUN: 36 mg/dL — ABNORMAL HIGH (ref 8–23)
CO2: 23 mmol/L (ref 22–32)
Calcium: 10.3 mg/dL (ref 8.9–10.3)
Chloride: 121 mmol/L — ABNORMAL HIGH (ref 98–111)
Creatinine, Ser: 1.32 mg/dL — ABNORMAL HIGH (ref 0.44–1.00)
GFR, Estimated: 40 mL/min — ABNORMAL LOW (ref >60)
Glucose, Bld: 121 mg/dL — ABNORMAL HIGH (ref 70–99)
Potassium: 4.2 mmol/L (ref 3.5–5.1)
Sodium: 155 mmol/L — ABNORMAL HIGH (ref 135–145)
Total Bilirubin: 0.6 mg/dL (ref <1.2)
Total Protein: 6.7 g/dL (ref 6.5–8.1)

## 2022-11-21 LAB — TSH: TSH: 0.174 u[IU]/mL — ABNORMAL LOW (ref 0.350–4.500)

## 2022-11-21 LAB — RESP PANEL BY RT-PCR (RSV, FLU A&B, COVID)  RVPGX2
Influenza A by PCR: NEGATIVE
Influenza B by PCR: NEGATIVE
Resp Syncytial Virus by PCR: NEGATIVE
SARS Coronavirus 2 by RT PCR: NEGATIVE

## 2022-11-21 LAB — AMMONIA: Ammonia: 16 umol/L (ref 9–35)

## 2022-11-21 LAB — T4, FREE: Free T4: 1.63 ng/dL — ABNORMAL HIGH (ref 0.61–1.12)

## 2022-11-21 LAB — I-STAT CG4 LACTIC ACID, ED: Lactic Acid, Venous: 1.6 mmol/L (ref 0.5–1.9)

## 2022-11-21 MED ORDER — IOHEXOL 350 MG/ML SOLN
60.0000 mL | Freq: Once | INTRAVENOUS | Status: AC | PRN
  Administered 2022-11-21: 60 mL via INTRAVENOUS

## 2022-11-21 MED ORDER — SODIUM CHLORIDE 0.9 % IV SOLN
1.0000 g | Freq: Once | INTRAVENOUS | Status: AC
  Administered 2022-11-22: 1 g via INTRAVENOUS
  Filled 2022-11-21: qty 10

## 2022-11-21 MED ORDER — DEXTROSE 5 % IV SOLN
500.0000 mg | Freq: Once | INTRAVENOUS | Status: AC
  Administered 2022-11-22: 500 mg via INTRAVENOUS
  Filled 2022-11-21: qty 5

## 2022-11-21 MED ORDER — LACTATED RINGERS IV BOLUS
1000.0000 mL | Freq: Once | INTRAVENOUS | Status: AC
  Administered 2022-11-21: 1000 mL via INTRAVENOUS

## 2022-11-21 NOTE — ED Notes (Signed)
XR at bedside

## 2022-11-21 NOTE — ED Notes (Signed)
Patient transported to CT 

## 2022-11-21 NOTE — ED Provider Notes (Signed)
[place] EMERGENCY DEPARTMENT AT [facility] Provider Note   CSN: [id number] Arrival date & time: 11/21/22  1925     History {Add pertinent medical, surgical, social history, OB history to HPI:1} Chief Complaint  Patient presents with  . Altered Mental Status    Allison [name] is a 88 y.o. female.  HPI     88 year old female with a history of dementia, diabetes, hypertension, hyperlipidemia, hypothyroidism, CVA who presents with concern for altered mental status.   There was a fall that happened 5 days ago from bed, she is known to have contusion to her forehead on the left.  She has dementia at baseline, however when family evaluated her today they found her to be less responsive than her baseline, slower to respond.  She is on a blood thinners.  Concerned her sugars have been high at the facility, eating potatoes when she should be on diabetic diet,  mixing up nights and days and not eating as sleeping during the day and not eating, awake at night  No known nausea, vomiting, diarrhea, cough, fever Most of the time will let you know if something hurting, not always though No cp, dyspnea  Used to get nights and days mixed sometimes at home but has been worse and energy level worse.  Holding food in mouth. Past Medical History:  Diagnosis Date  . Acute ischemic stroke (HCC) 10/29/2015  . Carpal tunnel syndrome   . Dementia (HCC)   . Diabetes mellitus without complication (HCC)   . Dysphagia   . GERD (gastroesophageal reflux disease)   . Hiatal hernia   . HLD (hyperlipidemia)   . Hx of adenomatous colonic polyps 06/19/2014  . Hyperlipidemia   . Hypertension   . Hypothyroidism   . Onychomycosis 03/05/2012  . Pain in lower limb 04/18/2013  . Sciatic nerve pain   . Stroke-like symptoms 10/28/2015  . Vertigo      Home Medications Prior to Admission medications   Medication Sig Start Date End Date Taking? Authorizing Provider  acetaminophen (TYLENOL)  500 MG tablet Take 500 mg by mouth daily as needed for mild pain.    [provider]  amLODipine (NORVASC) 10 MG tablet Take 1 tablet (10 mg total) by mouth daily. 08/18/22   Almon Hercules, MD  aspirin EC 81 MG tablet Take 1 tablet (81 mg total) by mouth daily. Swallow whole. 08/18/22 08/18/23  Almon Hercules, MD  atorvastatin (LIPITOR) 40 MG tablet Take 1 tablet (40 mg total) by mouth daily. 08/18/22   Almon Hercules, MD  Blood Glucose Monitoring Suppl DEVI 1 each by Does not apply route in the morning, at noon, and at bedtime. May substitute to any manufacturer covered by patient's insurance. 08/18/22   Almon Hercules, MD  calcium carbonate (TUMS EX) 750 MG chewable tablet Chew 2 tablets by mouth daily as needed (indigestion).    [provider]  diclofenac Sodium (VOLTAREN ARTHRITIS PAIN) 1 % GEL Apply 2 g topically daily as needed (for pain).    [provider]  insulin aspart protamine - aspart (NOVOLOG 70/30 MIX) (70-30) 100 UNIT/ML FlexPen Inject 10 Units into the skin 2 (two) times daily with a meal. 08/31/22   Meredeth Ide, MD  Insulin Pen Needle (PEN NEEDLES 3/16") 31G X 5 MM MISC 1 Pen by Does not apply route in the morning and at bedtime. 08/18/22   Almon Hercules, MD  levothyroxine (SYNTHROID) 125 MCG tablet Take 1 tablet (  125 mcg total) by mouth daily before breakfast. 08/18/22   Almon Hercules, MD  pantoprazole (PROTONIX) 20 MG tablet Take 1 tablet (20 mg total) by mouth daily. 01/10/22   Bethann Berkshire, MD      Allergies    Lantus [insulin glargine], Levemir [insulin detemir], Pineapple, Codeine, Invokana [canagliflozin], Penicillins, Sulfa antibiotics, Tradjenta [linagliptin], Vioxx [rofecoxib], and Exelon [rivastigmine]    Review of Systems   Review of Systems  Physical Exam Updated Vital Signs BP 112/80   Pulse (!) 102   Temp 97.9 F (36.6 C) (Oral)   Resp 19   SpO2 99%  Physical Exam Vitals and nursing note reviewed.  Constitutional:      General: She is not  in acute distress.    Appearance: She is well-developed. She is not diaphoretic.  HENT:     Head: Normocephalic and atraumatic.  Eyes:     Conjunctiva/sclera: Conjunctivae normal.  Cardiovascular:     Rate and Rhythm: Normal rate and regular rhythm.     Heart sounds: Normal heart sounds. No murmur heard.    No friction rub. No gallop.  Pulmonary:     Effort: Pulmonary effort is normal. No respiratory distress.     Breath sounds: Normal breath sounds. No wheezing or rales.  Abdominal:     General: There is no distension.     Palpations: Abdomen is soft.     Tenderness: There is no abdominal tenderness. There is no guarding.  Musculoskeletal:        General: No tenderness.     Cervical back: Normal range of motion.  Skin:    General: Skin is warm and dry.     Findings: No erythema or rash.  Neurological:     Mental Status: She is alert and oriented to person, place, and time.    ED Results / Procedures / Treatments   Labs (all labs ordered are listed, but only abnormal results are displayed) Labs Reviewed  COMPREHENSIVE METABOLIC PANEL  CBC    EKG None  Radiology No results found.  Procedures Procedures  {Document cardiac monitor, telemetry assessment procedure when appropriate:1}  Medications Ordered in ED Medications - No data to display  ED Course/ Medical Decision Making/ A&P   {   Click here for ABCD2, HEART and other calculatorsREFRESH Note before signing :1}                              Medical Decision Making Amount and/or Complexity of Data Reviewed Labs: ordered. Radiology: ordered.  Risk Prescription drug management. Decision regarding hospitalization.   ***  {Document critical care time when appropriate:1} {Document review of labs and clinical decision tools ie heart score, Chads2Vasc2 etc:1}  {Document your independent review of radiology images, and any outside records:1} {Document your discussion with family members, caretakers, and  with consultants:1} {Document social determinants of health affecting pt's care:1} {Document your decision making why or why not admission, treatments were needed:1} Final Clinical Impression(s) / ED Diagnoses Final diagnoses:  None    Rx / DC Orders ED Discharge Orders     None

## 2022-11-21 NOTE — ED Triage Notes (Signed)
Pt is coming in from [place], for a fall that occurred 1 weeks ago, there is a report of altered mental status when family visited today. The fall occurred 5 days ago from bed, she was not sent from [facility] for evaluation after the fall. The staff was not forth coming with information. Pt has a contusion to her forehead on the left. Pt has dementia at baseline, but family sent her because they felt like she was less responsive, and slower to respond but is responding to medic appropriately. NO blood thinners   Medic vitals   136/60 104hr 20rr 193bgl 98.6  Gcs 13 per medic  20g right hand

## 2022-11-22 ENCOUNTER — Other Ambulatory Visit: Payer: Self-pay

## 2022-11-22 DIAGNOSIS — W06XXXA Fall from bed, initial encounter: Secondary | ICD-10-CM | POA: Diagnosis present

## 2022-11-22 DIAGNOSIS — K5900 Constipation, unspecified: Secondary | ICD-10-CM

## 2022-11-22 DIAGNOSIS — E860 Dehydration: Secondary | ICD-10-CM | POA: Diagnosis present

## 2022-11-22 DIAGNOSIS — A419 Sepsis, unspecified organism: Secondary | ICD-10-CM | POA: Diagnosis present

## 2022-11-22 DIAGNOSIS — Z794 Long term (current) use of insulin: Secondary | ICD-10-CM | POA: Diagnosis not present

## 2022-11-22 DIAGNOSIS — I10 Essential (primary) hypertension: Secondary | ICD-10-CM

## 2022-11-22 DIAGNOSIS — Z7982 Long term (current) use of aspirin: Secondary | ICD-10-CM | POA: Diagnosis not present

## 2022-11-22 DIAGNOSIS — F0280 Dementia in other diseases classified elsewhere without behavioral disturbance: Secondary | ICD-10-CM | POA: Diagnosis present

## 2022-11-22 DIAGNOSIS — E039 Hypothyroidism, unspecified: Secondary | ICD-10-CM | POA: Diagnosis present

## 2022-11-22 DIAGNOSIS — Z882 Allergy status to sulfonamides status: Secondary | ICD-10-CM | POA: Diagnosis not present

## 2022-11-22 DIAGNOSIS — E870 Hyperosmolality and hypernatremia: Secondary | ICD-10-CM

## 2022-11-22 DIAGNOSIS — N179 Acute kidney failure, unspecified: Secondary | ICD-10-CM | POA: Diagnosis present

## 2022-11-22 DIAGNOSIS — G309 Alzheimer's disease, unspecified: Secondary | ICD-10-CM | POA: Diagnosis present

## 2022-11-22 DIAGNOSIS — N390 Urinary tract infection, site not specified: Secondary | ICD-10-CM | POA: Diagnosis present

## 2022-11-22 DIAGNOSIS — E1165 Type 2 diabetes mellitus with hyperglycemia: Secondary | ICD-10-CM | POA: Diagnosis present

## 2022-11-22 DIAGNOSIS — Z1152 Encounter for screening for COVID-19: Secondary | ICD-10-CM | POA: Diagnosis not present

## 2022-11-22 DIAGNOSIS — E1110 Type 2 diabetes mellitus with ketoacidosis without coma: Secondary | ICD-10-CM | POA: Diagnosis not present

## 2022-11-22 DIAGNOSIS — Z91018 Allergy to other foods: Secondary | ICD-10-CM | POA: Diagnosis not present

## 2022-11-22 DIAGNOSIS — G308 Other Alzheimer's disease: Secondary | ICD-10-CM | POA: Diagnosis not present

## 2022-11-22 DIAGNOSIS — Z79899 Other long term (current) drug therapy: Secondary | ICD-10-CM | POA: Diagnosis not present

## 2022-11-22 DIAGNOSIS — J189 Pneumonia, unspecified organism: Secondary | ICD-10-CM | POA: Diagnosis present

## 2022-11-22 DIAGNOSIS — Z79890 Hormone replacement therapy (postmenopausal): Secondary | ICD-10-CM | POA: Diagnosis not present

## 2022-11-22 DIAGNOSIS — Z885 Allergy status to narcotic agent status: Secondary | ICD-10-CM | POA: Diagnosis not present

## 2022-11-22 DIAGNOSIS — E785 Hyperlipidemia, unspecified: Secondary | ICD-10-CM | POA: Diagnosis present

## 2022-11-22 DIAGNOSIS — E871 Hypo-osmolality and hyponatremia: Secondary | ICD-10-CM | POA: Diagnosis present

## 2022-11-22 DIAGNOSIS — Z888 Allergy status to other drugs, medicaments and biological substances status: Secondary | ICD-10-CM | POA: Diagnosis not present

## 2022-11-22 DIAGNOSIS — Z880 Allergy status to penicillin: Secondary | ICD-10-CM | POA: Diagnosis not present

## 2022-11-22 HISTORY — DX: Sepsis, unspecified organism: A41.9

## 2022-11-22 LAB — CBC
HCT: 35 % — ABNORMAL LOW (ref 36.0–46.0)
Hemoglobin: 10.8 g/dL — ABNORMAL LOW (ref 12.0–15.0)
MCH: 26.1 pg (ref 26.0–34.0)
MCHC: 30.9 g/dL (ref 30.0–36.0)
MCV: 84.5 fL (ref 80.0–100.0)
Platelets: 246 10*3/uL (ref 150–400)
RBC: 4.14 MIL/uL (ref 3.87–5.11)
RDW: 15.9 % — ABNORMAL HIGH (ref 11.5–15.5)
WBC: 11.2 10*3/uL — ABNORMAL HIGH (ref 4.0–10.5)
nRBC: 0 % (ref 0.0–0.2)

## 2022-11-22 LAB — BASIC METABOLIC PANEL
Anion gap: 12 (ref 5–15)
BUN: 32 mg/dL — ABNORMAL HIGH (ref 8–23)
CO2: 23 mmol/L (ref 22–32)
Calcium: 9.7 mg/dL (ref 8.9–10.3)
Chloride: 120 mmol/L — ABNORMAL HIGH (ref 98–111)
Creatinine, Ser: 1.29 mg/dL — ABNORMAL HIGH (ref 0.44–1.00)
GFR, Estimated: 41 mL/min — ABNORMAL LOW (ref >60)
Glucose, Bld: 194 mg/dL — ABNORMAL HIGH (ref 70–99)
Potassium: 4 mmol/L (ref 3.5–5.1)
Sodium: 155 mmol/L — ABNORMAL HIGH (ref 135–145)

## 2022-11-22 LAB — URINALYSIS, W/ REFLEX TO CULTURE (INFECTION SUSPECTED)
Bilirubin Urine: NEGATIVE
Glucose, UA: NEGATIVE mg/dL
Ketones, ur: NEGATIVE mg/dL
Nitrite: NEGATIVE
Protein, ur: 100 mg/dL — AB
Specific Gravity, Urine: 1.014 (ref 1.005–1.030)
WBC, UA: >50 WBC/hpf (ref 0–5)
pH: 5 (ref 5.0–8.0)

## 2022-11-22 LAB — CBG MONITORING, ED: Glucose-Capillary: 209 mg/dL — ABNORMAL HIGH (ref 70–99)

## 2022-11-22 LAB — GLUCOSE, CAPILLARY
Glucose-Capillary: 225 mg/dL — ABNORMAL HIGH (ref 70–99)
Glucose-Capillary: 98 mg/dL (ref 70–99)
Glucose-Capillary: 224 mg/dL — ABNORMAL HIGH (ref 70–99)

## 2022-11-22 MED ORDER — ENOXAPARIN SODIUM 30 MG/0.3ML IJ SOSY
30.0000 mg | PREFILLED_SYRINGE | INTRAMUSCULAR | Status: DC
  Administered 2022-11-22 – 2022-11-25 (×4): 30 mg via SUBCUTANEOUS
  Filled 2022-11-22 (×5): qty 0.3

## 2022-11-22 MED ORDER — DEXTROSE 5 % IV SOLN: INTRAVENOUS | Status: DC

## 2022-11-22 MED ORDER — SODIUM CHLORIDE 0.9 % IV SOLN: INTRAVENOUS | Status: AC

## 2022-11-22 MED ORDER — POLYETHYLENE GLYCOL 3350 17 G PO PACK
17.0000 g | PACK | Freq: Every day | ORAL | Status: DC
  Administered 2022-11-22 – 2022-11-25 (×3): 17 g via ORAL
  Filled 2022-11-22 (×4): qty 1

## 2022-11-22 MED ORDER — DEXTROSE 5 % IV SOLN
500.0000 mg | INTRAVENOUS | Status: DC
  Administered 2022-11-23 – 2022-11-24 (×2): 500 mg via INTRAVENOUS
  Filled 2022-11-22 (×3): qty 5

## 2022-11-22 MED ORDER — INSULIN ASPART 100 UNIT/ML IJ SOLN
0.0000 [IU] | Freq: Three times a day (TID) | INTRAMUSCULAR | Status: DC
  Administered 2022-11-22 (×2): 5 [IU] via SUBCUTANEOUS
  Administered 2022-11-23: 3 [IU] via SUBCUTANEOUS
  Administered 2022-11-23: 5 [IU] via SUBCUTANEOUS
  Administered 2022-11-23: 2 [IU] via SUBCUTANEOUS
  Administered 2022-11-24 (×2): 5 [IU] via SUBCUTANEOUS
  Administered 2022-11-24 – 2022-11-25 (×2): 8 [IU] via SUBCUTANEOUS
  Administered 2022-11-25: 3 [IU] via SUBCUTANEOUS

## 2022-11-22 MED ORDER — SODIUM CHLORIDE 0.9 % IV SOLN
1.0000 g | INTRAVENOUS | Status: DC
  Administered 2022-11-22 – 2022-11-24 (×3): 1 g via INTRAVENOUS
  Filled 2022-11-22 (×4): qty 10

## 2022-11-22 NOTE — Care Plan (Signed)
This 88 y.o. female with medical history significant of HTN, HLD, CVA, insulin-dependent Type 2 DM, hypothyroidism, dementia who presents with altered mental status and recent fall. As per report Patient had a fall 5 days ago but was never evaluated. She presented today with increased altered mentation but unclear what is her baseline with history of dementia.She is found to have leukocytosis, hypernatremia with a sodium of 155.Chest x-ray showed slight hazy left lower lobe opacities. CT head is negative. Patient empirically started on ceftriaxone and Zithromax. Patient is admitted for sepsis likely secondary to pneumonia. Continue D5W for hypernatremia.  Continue to monitor serum electrolytes.

## 2022-11-22 NOTE — ED Notes (Signed)
ED TO INPATIENT HANDOFF REPORT  ED Nurse Name and Phone #: Vernona Rieger [phone]  S Name/Age/Gender Allison [name] 88 y.o. female Room/Bed: 039C/039C  Code Status   Code Status: Full Code  Home/SNF/Other Skilled nursing facility Patient oriented to: self Is this baseline? Yes   Triage Complete: Triage complete  Chief Complaint Sepsis [facility]) [A41.9]  Triage Note Pt is coming in from [place], for a fall that occurred 1 weeks ago, there is a report of altered mental status when family visited today. The fall occurred 5 days ago from bed, she was not sent from [facility] for evaluation after the fall. The staff was not forth coming with information. Pt has a contusion to her forehead on the left. Pt has dementia at baseline, but family sent her because they felt like she was less responsive, and slower to respond but is responding to medic appropriately. NO blood thinners   Medic vitals   136/60 104hr 20rr 193bgl 98.6  Gcs 13 per medic  20g right hand   Allergies Allergies  Allergen Reactions   Lantus [Insulin Glargine] Itching and Swelling    angioedema   Levemir [Insulin Detemir] Itching and Swelling    angioedema   Pineapple Anaphylaxis    Not listed on the [facility]   Codeine Other (See Comments)    Not listed on the [facility]   Invokana [Canagliflozin] Other (See Comments)    Constipation, nightmares Not listed on the [facility]   Penicillins Other (See Comments)    Unknown reaction per MAR    Sulfa Antibiotics Other (See Comments)    Unknown reaction per MAR **Sulfasalazine included on MAR**   Tradjenta [Linagliptin] Other (See Comments)    Not listed on the [facility]   Vioxx [Rofecoxib] Other (See Comments)    Not listed on the [facility]   Exelon [Rivastigmine] Rash    Level of Care/Admitting Diagnosis ED Disposition     ED Disposition  Admit   Condition  --   Comment  Hospital Area: [facility] [[id number]]  Level of Care: Telemetry Medical [104]  May admit patient  to [facility] or [facility] if equivalent level of care is available:: No  Covid Evaluation: Asymptomatic - no recent exposure (last 10 days) testing not required  Diagnosis: Sepsis [facility]) [[id number]]  Admitting Physician: Anselm Jungling [[id number]]  Attending Physician: Anselm Jungling [[id number]]  Certification:: I certify this patient will need inpatient services for at least 2 midnights  Expected Medical Readiness: 11/25/2022          B Medical/Surgery History Past Medical History:  Diagnosis Date   Acute ischemic stroke (HCC) 10/29/2015   Carpal tunnel syndrome    Dementia (HCC)    Diabetes mellitus without complication (HCC)    Dysphagia    GERD (gastroesophageal reflux disease)    Hiatal hernia    HLD (hyperlipidemia)    Hx of adenomatous colonic polyps 06/19/2014   Hyperlipidemia    Hypertension    Hypothyroidism    Onychomycosis 03/05/2012   Pain in lower limb 04/18/2013   Sciatic nerve pain    Stroke-like symptoms 10/28/2015   Vertigo    Past Surgical History:  Procedure Laterality Date   BLADDER SURGERY     Mesh implant   COLONOSCOPY     LARYNX SURGERY     vocal cords   PARTIAL HYSTERECTOMY  1978   VESICOVAGINAL FISTULA CLOSURE W/ TAH  1976     A IV Location/Drains/Wounds Patient Lines/Drains/Airways Status  Active Line/Drains/Airways     Name Placement date Placement time Site Days   Peripheral IV 11/21/22 20 G Right Antecubital 11/21/22  2125  Antecubital  1   Peripheral IV 11/21/22 20 G Right Hand 11/21/22  2125  Hand  1   Pressure Injury 08/25/22 Coccyx Bilateral Deep Tissue Pressure Injury - Purple or maroon localized area of discolored intact skin or blood-filled blister due to damage of underlying soft tissue from pressure and/or shear. 08/25/22  2330  -- 89            Intake/Output Last 24 hours  Intake/Output Summary (Last 24 hours) at 11/22/2022 1052 Last data filed at 11/22/2022 0244 Gross per 24 hour  Intake 250 ml  Output 400 ml  Net  -150 ml    Labs/Imaging Results for orders placed or performed during the hospital encounter of 11/21/22 (from the past 48 hour(s))  Resp panel by RT-PCR (RSV, Flu A&B, Covid) Anterior Nasal Swab     Status: None   Collection Time: 11/21/22  7:55 PM   Specimen: Anterior Nasal Swab  Result Value Ref Range   SARS Coronavirus 2 by RT PCR NEGATIVE NEGATIVE   Influenza A by PCR NEGATIVE NEGATIVE   Influenza B by PCR NEGATIVE NEGATIVE    Comment: (NOTE) The Xpert Xpress SARS-CoV-2/FLU/RSV plus assay is intended as an aid in the diagnosis of influenza from Nasopharyngeal swab specimens and should not be used as a sole basis for treatment. Nasal washings and aspirates are unacceptable for Xpert Xpress SARS-CoV-2/FLU/RSV testing.  Fact Sheet for Patients: BloggerCourse.com  Fact Sheet for Healthcare Providers: SeriousBroker.it  This test is not yet approved or cleared by the [place] FDA and has been authorized for detection and/or diagnosis of SARS-CoV-2 by FDA under an Emergency Use Authorization (EUA). This EUA will remain in effect (meaning this test can be used) for the duration of the COVID-19 declaration under Section 564(b)(1) of the Act, 21 U.S.C. section 360bbb-3(b)(1), unless the authorization is terminated or revoked.     Resp Syncytial Virus by PCR NEGATIVE NEGATIVE    Comment: (NOTE) Fact Sheet for Patients: BloggerCourse.com  Fact Sheet for Healthcare Providers: SeriousBroker.it  This test is not yet approved or cleared by the [place] FDA and has been authorized for detection and/or diagnosis of SARS-CoV-2 by FDA under an Emergency Use Authorization (EUA). This EUA will remain in effect (meaning this test can be used) for the duration of the COVID-19 declaration under Section 564(b)(1) of the Act, 21 U.S.C. section 360bbb-3(b)(1), unless the authorization  is terminated or revoked.  Performed at [facility], [address], [place], [place] [address]   Blood culture (routine x 2)     Status: None (Preliminary result)   Collection Time: 11/21/22  9:06 PM   Specimen: BLOOD  Result Value Ref Range   Specimen Description BLOOD RIGHT ANTECUBITAL    Special Requests      BOTTLES DRAWN AEROBIC AND ANAEROBIC Blood Culture adequate volume   Culture      NO GROWTH < 12 HOURS Performed at [facility], [address], [place], [place] [address]    Report Status PENDING   Comprehensive metabolic panel     Status: Abnormal   Collection Time: 11/21/22  9:09 PM  Result Value Ref Range   Sodium 155 (H) 135 - 145 mmol/L   Potassium 4.2 3.5 - 5.1 mmol/L    Comment: HEMOLYSIS AT THIS LEVEL MAY AFFECT RESULT  Chloride 121 (H) 98 - 111 mmol/L   CO2 23 22 - 32 mmol/L   Glucose, Bld 121 (H) 70 - 99 mg/dL    Comment: Glucose reference range applies only to samples taken after fasting for at least 8 hours.   BUN 36 (H) 8 - 23 mg/dL   Creatinine, Ser 1.61 (H) 0.44 - 1.00 mg/dL   Calcium 09.6 8.9 - 04.5 mg/dL   Total Protein 6.7 6.5 - 8.1 g/dL   Albumin 3.1 (L) 3.5 - 5.0 g/dL   AST 20 15 - 41 U/L    Comment: HEMOLYSIS AT THIS LEVEL MAY AFFECT RESULT   ALT 14 0 - 44 U/L    Comment: HEMOLYSIS AT THIS LEVEL MAY AFFECT RESULT   Alkaline Phosphatase 68 38 - 126 U/L   Total Bilirubin 0.6 <1.2 mg/dL    Comment: HEMOLYSIS AT THIS LEVEL MAY AFFECT RESULT   GFR, Estimated 40 (L) >60 mL/min    Comment: (NOTE) Calculated using the CKD-EPI Creatinine Equation (2021)    Anion gap 11 5 - 15    Comment: Performed at [facility], [address], [place], [place] [address]  CBC with Differential     Status: Abnormal   Collection Time: 11/21/22  9:09 PM  Result Value Ref Range   WBC 12.1 (H) 4.0 - 10.5 K/uL   RBC 4.60 3.87 - 5.11 MIL/uL   Hemoglobin 12.0 12.0 - 15.0 g/dL   HCT 19.1 47.8 - 29.5 %   MCV 85.0 80.0 - 100.0 fL   MCH 26.1 26.0 -  34.0 pg   MCHC 30.7 30.0 - 36.0 g/dL   RDW 62.1 (H) 30.8 - 65.7 %   Platelets 282 150 - 400 K/uL   nRBC 0.0 0.0 - 0.2 %   Neutrophils Relative % 62 %   Neutro Abs 7.5 1.7 - 7.7 K/uL   Lymphocytes Relative 22 %   Lymphs Abs 2.6 0.7 - 4.0 K/uL   Monocytes Relative 14 %   Monocytes Absolute 1.7 (H) 0.1 - 1.0 K/uL   Eosinophils Relative 1 %   Eosinophils Absolute 0.1 0.0 - 0.5 K/uL   Basophils Relative 1 %   Basophils Absolute 0.1 0.0 - 0.1 K/uL   Immature Granulocytes 0 %   Abs Immature Granulocytes 0.04 0.00 - 0.07 K/uL    Comment: Performed at [facility], [address], [place], [place] [address]  Ammonia     Status: None   Collection Time: 11/21/22  9:09 PM  Result Value Ref Range   Ammonia 16 9 - 35 umol/L    Comment: Performed at [facility], [address], [place], [place] [address]  Blood culture (routine x 2)     Status: None (Preliminary result)   Collection Time: 11/21/22  9:09 PM   Specimen: BLOOD  Result Value Ref Range   Specimen Description BLOOD LEFT ANTECUBITAL    Special Requests      BOTTLES DRAWN AEROBIC AND ANAEROBIC Blood Culture results may not be optimal due to an inadequate volume of blood received in culture bottles   Culture      NO GROWTH < 12 HOURS Performed at [facility], [address], [place], [place] [address]    Report Status PENDING   T4, free     Status: Abnormal   Collection Time: 11/21/22  9:09 PM  Result Value Ref Range   Free T4 1.63 (H) 0.61 - 1.12 ng/dL  Comment: HEMOLYSIS AT THIS LEVEL MAY AFFECT RESULT (NOTE) Biotin ingestion may interfere with free T4 tests. If the results are inconsistent with the TSH level, previous test results, or the clinical presentation, then consider biotin interference. If needed, order repeat testing after stopping biotin. Performed at [facility], [address] [place], [place] [address]   TSH     Status: Abnormal   Collection Time: 11/21/22  9:09 PM  Result  Value Ref Range   TSH 0.174 (L) 0.350 - 4.500 uIU/mL    Comment: Performed by a 3rd Generation assay with a functional sensitivity of <=0.01 uIU/mL. Performed at [facility], [address] [place], [place] [address]   I-Stat CG4 Lactic Acid     Status: None   Collection Time: 11/21/22  9:30 PM  Result Value Ref Range   Lactic Acid, Venous 1.6 0.5 - 1.9 mmol/L  Urinalysis, w/ Reflex to Culture (Infection Suspected) -Urine, Clean Catch     Status: Abnormal   Collection Time: 11/22/22 12:02 AM  Result Value Ref Range   Specimen Source URINE, CLEAN CATCH    Color, Urine YELLOW (A) YELLOW   APPearance TURBID (A) CLEAR   Specific Gravity, Urine 1.014 1.005 - 1.030   pH 5.0 5.0 - 8.0   Glucose, UA NEGATIVE NEGATIVE mg/dL   Hgb urine dipstick SMALL (A) NEGATIVE   Bilirubin Urine NEGATIVE NEGATIVE   Ketones, ur NEGATIVE NEGATIVE mg/dL   Protein, ur 829 (A) NEGATIVE mg/dL   Nitrite NEGATIVE NEGATIVE   Leukocytes,Ua MODERATE (A) NEGATIVE   RBC / HPF 0-5 0 - 5 RBC/hpf   WBC, UA >50 0 - 5 WBC/hpf    Comment:        Reflex urine culture not performed if WBC <=10, OR if Squamous epithelial cells >5. If Squamous epithelial cells >5 suggest recollection.    Bacteria, UA MANY (A) NONE SEEN   Squamous Epithelial / HPF 0-5 0 - 5 /HPF   Budding Yeast PRESENT     Comment: Performed at [facility], [address] [place], [place] [address]  CBC     Status: Abnormal   Collection Time: 11/22/22  4:09 AM  Result Value Ref Range   WBC 11.2 (H) 4.0 - 10.5 K/uL   RBC 4.14 3.87 - 5.11 MIL/uL   Hemoglobin 10.8 (L) 12.0 - 15.0 g/dL   HCT 08.6 (L) 57.8 - 46.9 %   MCV 84.5 80.0 - 100.0 fL   MCH 26.1 26.0 - 34.0 pg   MCHC 30.9 30.0 - 36.0 g/dL   RDW 62.9 (H) 52.8 - 41.3 %   Platelets 246 150 - 400 K/uL   nRBC 0.0 0.0 - 0.2 %    Comment: Performed at [facility], [address], [place], [place] [address]  Basic metabolic panel     Status: Abnormal   Collection Time: 11/22/22   4:09 AM  Result Value Ref Range   Sodium 155 (H) 135 - 145 mmol/L   Potassium 4.0 3.5 - 5.1 mmol/L   Chloride 120 (H) 98 - 111 mmol/L   CO2 23 22 - 32 mmol/L   Glucose, Bld 194 (H) 70 - 99 mg/dL    Comment: Glucose reference range applies only to samples taken after fasting for at least 8 hours.   BUN 32 (H) 8 - 23 mg/dL   Creatinine, Ser 0.27 (H) 0.44 - 1.00 mg/dL   Calcium 9.7 8.9 - 25.3 mg/dL  GFR, Estimated 41 (L) >60 mL/min    Comment: (NOTE) Calculated using the CKD-EPI Creatinine Equation (2021)    Anion gap 12 5 - 15    Comment: Performed at [facility], [address] [place], [place] [address]  CBG monitoring, ED     Status: Abnormal   Collection Time: 11/22/22  8:25 AM  Result Value Ref Range   Glucose-Capillary 209 (H) 70 - 99 mg/dL    Comment: Glucose reference range applies only to samples taken after fasting for at least 8 hours.   CT ABDOMEN PELVIS W CONTRAST  Result Date: 11/22/2022 CLINICAL DATA:  Left lower quadrant abdominal pain. EXAM: CT ABDOMEN AND PELVIS WITH CONTRAST TECHNIQUE: Multidetector CT imaging of the abdomen and pelvis was performed using the standard protocol following bolus administration of intravenous contrast. RADIATION DOSE REDUCTION: This exam was performed according to the departmental dose-optimization program which includes automated exposure control, adjustment of the mA and/or kV according to patient size and/or use of iterative reconstruction technique. CONTRAST:  60mL OMNIPAQUE IOHEXOL 350 MG/ML SOLN COMPARISON:  08/25/2022. FINDINGS: Lower chest: Dependent atelectasis is noted at the lung bases. Multiple pulmonary nodules are seen bilaterally measuring up to 7 mm in the left lower lobe, axial image 11. Hepatobiliary: No focal liver abnormality is seen. No gallstones, gallbladder wall thickening, or biliary dilatation. Pancreas: Unremarkable. No pancreatic ductal dilatation or surrounding inflammatory changes. Spleen: Normal in size  without focal abnormality. Adrenals/Urinary Tract: The adrenal glands are within normal limits. The kidneys enhance symmetrically. Cysts are present in the kidneys bilaterally. No renal calculus or hydronephrosis is seen. The bladder is unremarkable. Stomach/Bowel: Stomach is within normal limits. Appendix is not seen. No evidence of bowel wall thickening, distention, or inflammatory changes. No free air or pneumatosis. Few scattered diverticula are present along the colon without evidence of diverticulitis. A moderate amount of retained stool is present in the colon and rectum. Vascular/Lymphatic: Aortic atherosclerosis. No enlarged abdominal or pelvic lymph nodes. Reproductive: Status post hysterectomy. No adnexal masses. Other: No abdominopelvic ascites. Musculoskeletal: Degenerative changes are present in the thoracolumbar spine. No acute osseous abnormality is seen. IMPRESSION: 1. No acute intra-abdominal process. 2. Moderate amount of retained stool in the colon and rectum suggesting constipation. 3. Diverticulosis without diverticulitis. 4. Stable scattered pulmonary nodules at the lung bases measuring up to 7 mm, unchanged from 2019 and likely benign. 5. Aortic atherosclerosis. Electronically Signed   By: Thornell Sartorius M.D.   On: 11/22/2022 00:27   DG Hip Unilat W or Wo Pelvis 2-3 Views Right  Result Date: 11/22/2022 CLINICAL DATA:  Fall EXAM: DG HIP (WITH OR WITHOUT PELVIS) 2-3V RIGHT COMPARISON:  None Available. FINDINGS: There is no evidence of hip fracture or dislocation. Degenerative changes pubic symphysis, both hips, SI joints and lower lumbar spine. IMPRESSION: No acute fracture or dislocation. Electronically Signed   By: Minerva Fester M.D.   On: 11/22/2022 00:02   CT Head Wo Contrast  Result Date: 11/21/2022 CLINICAL DATA:  Head trauma, altered mental status. Fall from bed 5 days ago. EXAM: CT HEAD WITHOUT CONTRAST TECHNIQUE: Contiguous axial images were obtained from the base of the  skull through the vertex without intravenous contrast. RADIATION DOSE REDUCTION: This exam was performed according to the departmental dose-optimization program which includes automated exposure control, adjustment of the mA and/or kV according to patient size and/or use of iterative reconstruction technique. COMPARISON:  02/29/2023. FINDINGS: Brain: No acute intracranial hemorrhage, midline shift or mass effect. No extra-axial  fluid collection. Diffuse atrophy is noted. Periventricular white matter hypodensities are present bilaterally. No hydrocephalus. Hypodensity is noted in the lentiform nucleus on the left suggesting old infarct. Vascular: No hyperdense vessel or unexpected calcification. Skull: Normal. Negative for fracture or focal lesion. Sinuses/Orbits: No acute finding. Other: None. IMPRESSION: 1. No acute intracranial process. 2. Atrophy with chronic microvascular ischemic changes and old infarct. Electronically Signed   By: Thornell Sartorius M.D.   On: 11/21/2022 21:22   DG Chest Portable 1 View  Result Date: 11/21/2022 CLINICAL DATA:  Altered mental status EXAM: PORTABLE CHEST 1 VIEW COMPARISON:  Radiograph 08/24/2022 and CT chest 08/25/2022 FINDINGS: Airspace opacities in the left lower lung. The lungs are otherwise clear. Stable cardiomediastinal silhouette. Aortic atherosclerotic calcification. IMPRESSION: Airspace opacities in the left lower lung may represent atelectasis, pneumonia, or aspiration. Electronically Signed   By: Minerva Fester M.D.   On: 11/21/2022 21:12    Pending Labs Unresulted Labs (From admission, onward)     Start     Ordered   11/22/22 0002  Urine Culture  Once,   R        11/22/22 0002            Vitals/Pain Today's Vitals   11/22/22 0533 11/22/22 0800 11/22/22 0858 11/22/22 1000  BP:  (!) 106/55 (!) 93/52 (!) 104/56  Pulse:  (!) 103 95 100  Resp:   15   Temp:   97.7 F (36.5 C)   TempSrc:   Oral   SpO2:  99% 98% 99%  Weight:      Height:       PainSc: Asleep       Isolation Precautions No active isolations  Medications Medications  enoxaparin (LOVENOX) injection 30 mg (30 mg Subcutaneous Given 11/22/22 1026)  dextrose 5 % solution ( Intravenous New Bag/Given 11/22/22 0530)  cefTRIAXone (ROCEPHIN) 1 g in sodium chloride 0.9 % 100 mL IVPB (has no administration in time range)  azithromycin (ZITHROMAX) 500 mg in dextrose 5 % 250 mL IVPB (has no administration in time range)  insulin aspart (novoLOG) injection 0-15 Units (5 Units Subcutaneous Given 11/22/22 0912)  polyethylene glycol (MIRALAX / GLYCOLAX) packet 17 g (17 g Oral Given 11/22/22 1025)  lactated ringers bolus 1,000 mL (0 mLs Intravenous Stopped 11/22/22 0528)  cefTRIAXone (ROCEPHIN) 1 g in sodium chloride 0.9 % 100 mL IVPB (0 g Intravenous Stopped 11/22/22 0132)  azithromycin (ZITHROMAX) 500 mg in dextrose 5 % 250 mL IVPB (0 mg Intravenous Stopped 11/22/22 0244)  iohexol (OMNIPAQUE) 350 MG/ML injection 60 mL (60 mLs Intravenous Contrast Given 11/21/22 2330)    Mobility Hasn't been up in the ED.      Focused Assessments Pulmonary Assessment Handoff:  Lung sounds:   O2 Device: Room Air      R Recommendations: See Admitting Provider Note  Report given to:   Additional Notes: Hard to wake up when asleep. Needs encouragement with eating. Dysphagia diet.

## 2022-11-22 NOTE — Assessment & Plan Note (Signed)
-   Likely due to decreased intake - Has received 1 L of normal saline bolus in the ED.  Will continue D5W continuous infusion overnight. - Follow sodium in the morning.

## 2022-11-22 NOTE — H&P (Signed)
History and Physical    Patient: Allison [name] WUJ:[id number] DOB: Jun 06, 1934 DOA: 11/21/2022 DOS: the patient was seen and examined on 11/22/2022 PCP: [facility]  Patient coming from: SNF  Chief Complaint:  Chief Complaint  Patient presents with   Altered Mental Status   HPI: Allison [name] is a 88 y.o. female with medical history significant of HTN, HLD, CVA,insulin-dependent Type 2 DM, hypothyroidism, dementia who presents with altered mental status and recent fall.  Patient is very drowsy on exam unable to provide history.  She is only able to tell me that she is in the hospital and that she feels "fine".  Reportedly per ED documentation she had a fall about 5 days ago but her nursing facility but was never evaluated.  She presented today with increased altered mental status although unclear what her baseline with her history of dementia.    On arrival to the ED, she was afebrile, heart rate of 102, normotensive   on room air.  CBC with leukocytosis 12.1, hemoglobin of 12, platelet of 282.  BMP with hyponatremia of 155, chloride 121, elevated creatinine of 1.32.  Chest x-ray on my view with slight hazy left lower lobe opacities.  CT head is negative.  CT abdomen pelvis was obtained due to left lower quadrant pain elicited by ED physician.  This is pending at the time of admission. UA is also pending.   Patient was started empirically on IV Rocephin and azithromycin.  Hospitalist consulted for admission.   Review of Systems: unable to review all systems due to the inability of the patient to answer questions. Past Medical History:  Diagnosis Date   Acute ischemic stroke (HCC) 10/29/2015   Carpal tunnel syndrome    Dementia (HCC)    Diabetes mellitus without complication (HCC)    Dysphagia    GERD (gastroesophageal reflux disease)    Hiatal hernia    HLD (hyperlipidemia)    Hx of adenomatous colonic polyps 06/19/2014    Hyperlipidemia    Hypertension    Hypothyroidism    Onychomycosis 03/05/2012   Pain in lower limb 04/18/2013   Sciatic nerve pain    Stroke-like symptoms 10/28/2015   Vertigo    Past Surgical History:  Procedure Laterality Date   BLADDER SURGERY     Mesh implant   COLONOSCOPY     LARYNX SURGERY     vocal cords   PARTIAL HYSTERECTOMY  1978   VESICOVAGINAL FISTULA CLOSURE W/ TAH  1976   Social History:  reports that she has never smoked. She has never used smokeless tobacco. She reports that she does not drink alcohol and does not use drugs.  Allergies  Allergen Reactions   Lantus [Insulin Glargine] Itching and Swelling    angioedema   Levemir [Insulin Detemir] Itching and Swelling    angioedema   Pineapple Anaphylaxis    Not listed on the [facility]   Codeine Other (See Comments)    Unknown reaction per [facility]   Invokana [Canagliflozin] Other (See Comments)    Constipation, nightmares Not listed on the [facility]   Penicillins Other (See Comments)    Unknown reaction per [facility]    Sulfa Antibiotics Other (See Comments)    Unknown reaction per [facility]   Tradjenta [Linagliptin] Other (See Comments)    Unknown   Vioxx [Rofecoxib] Other (See Comments)    Unknown reaction per [facility]   Exelon [Rivastigmine] Rash    Family History  Problem Relation Age of  Onset   Alzheimer's disease Mother    Glaucoma Mother    Heart disease Father    Alzheimer's disease Father    Asthma Sister    Allergies Sister    Allergies Sister    Sleep apnea Sister    Heart disease Sister    Breast cancer Maternal Aunt    Pancreatic cancer Maternal Aunt    Prostate cancer Maternal Uncle    Prostate cancer Paternal Uncle    Alcoholism Maternal Uncle    Kidney disease Maternal Aunt    Heart disease Maternal Aunt     Prior to Admission medications   Medication Sig Start Date End Date Taking? Authorizing Provider  acetaminophen (TYLENOL) 500 MG tablet Take 500 mg by mouth daily as needed for mild pain.     [provider]  amLODipine (NORVASC) 10 MG tablet Take 1 tablet (10 mg total) by mouth daily. 08/18/22   Almon Hercules, MD  aspirin EC 81 MG tablet Take 1 tablet (81 mg total) by mouth daily. Swallow whole. 08/18/22 08/18/23  Almon Hercules, MD  atorvastatin (LIPITOR) 40 MG tablet Take 1 tablet (40 mg total) by mouth daily. 08/18/22   Almon Hercules, MD  Blood Glucose Monitoring Suppl DEVI 1 each by Does not apply route in the morning, at noon, and at bedtime. May substitute to any manufacturer covered by patient's insurance. 08/18/22   Almon Hercules, MD  calcium carbonate (TUMS EX) 750 MG chewable tablet Chew 2 tablets by mouth daily as needed (indigestion).    [provider]  diclofenac Sodium (VOLTAREN ARTHRITIS PAIN) 1 % GEL Apply 2 g topically daily as needed (for pain).    [provider]  insulin aspart protamine - aspart (NOVOLOG 70/30 MIX) (70-30) 100 UNIT/ML FlexPen Inject 10 Units into the skin 2 (two) times daily with a meal. 08/31/22   Meredeth Ide, MD  Insulin Pen Needle (PEN NEEDLES 3/16") 31G X 5 MM MISC 1 Pen by Does not apply route in the morning and at bedtime. 08/18/22   Almon Hercules, MD  levothyroxine (SYNTHROID) 125 MCG tablet Take 1 tablet (125 mcg total) by mouth daily before breakfast. 08/18/22   Almon Hercules, MD  pantoprazole (PROTONIX) 20 MG tablet Take 1 tablet (20 mg total) by mouth daily. 01/10/22   Bethann Berkshire, MD    Physical Exam: Vitals:   11/22/22 0000 11/22/22 0015 11/22/22 0020 11/22/22 0030  BP: 118/63 (!) 116/56  111/60  Pulse: (!) 108 (!) 104  (!) 110  Resp: 14 16  (!) 24  Temp:      TempSrc:      SpO2: 100% 98%  99%  Weight:   58 kg   Height:   5\' 4"  (1.626 m)    Constitutional: NAD, calm, comfortable, thin chronically ill-appearing elderly female laying in bed asleep.  Awoke to light touch but would drift back off to sleep immediately. Eyes: lids and conjunctivae normal ENMT: Mucous membranes are moist.  Neck: normal,  supple Respiratory: clear to auscultation bilaterally, no wheezing, no crackles. Normal respiratory effort. No accessory muscle use.  Cardiovascular: Regular rate and rhythm, no murmurs / rubs / gallops. No extremity edema. Abdomen: no tenderness, soft, no masses palpated.   Musculoskeletal: no clubbing / cyanosis. No joint deformity upper and lower extremities. Muscle wasting on all extremities Skin: no rashes, lesions, ulcers. No induration Neurologic: Pt remained mostly asleep throughout the evaluation.  She awoke to light touch but would  drift back off to sleep almost immediately.  She was able to tell me she was at the hospital and that she was feeling fine.  She was able to withdraw all extremities during exam. Psychiatric: Normal mood.   Data Reviewed:  See HPI  Assessment and Plan: * Sepsis (HCC) - Presented with tachycardia, leukocytosis and finding of left lower lobe pneumonia on chest x-ray - Patient appears to have aspiration risk.  She was placed on dysphasic 1 diet in the recent past. - Continue IV Rocephin and azithromycin  DKA, type 2 (HCC) - Uncontrolled, last hemoglobin A1c of 11.2 in August - Placed on sliding scale insulin  Hypernatremia - Likely due to decreased intake - Has received 1 L of normal saline bolus in the ED.  Will continue D5W continuous infusion overnight. - Follow sodium in the morning.  Alzheimer's dementia [facility]) - Patient drowsy on exam and only able to tell me that she is in the hospital.  Unclear of her baseline.  Constipation Miralax daily in the morning once mental status improves  Essential hypertension - Controlled - Will need to verify medication with nursing facility in the morning      Advance Care Planning: Full  Consults: None  Family Communication: None at bedside  Severity of Illness: The appropriate patient status for this patient is INPATIENT. Inpatient status is judged to be reasonable and necessary in order to  provide the required intensity of service to ensure the patient's safety. The patient's presenting symptoms, physical exam findings, and initial radiographic and laboratory data in the context of their chronic comorbidities is felt to place them at high risk for further clinical deterioration. Furthermore, it is not anticipated that the patient will be medically stable for discharge from the hospital within 2 midnights of admission.   * I certify that at the point of admission it is my clinical judgment that the patient will require inpatient hospital care spanning beyond 2 midnights from the point of admission due to high intensity of service, high risk for further deterioration and high frequency of surveillance required.*  Author: Anselm Jungling, DO 11/22/2022 12:44 AM  For on call review [email or web address].

## 2022-11-22 NOTE — Assessment & Plan Note (Signed)
-   Uncontrolled, last hemoglobin A1c of 11.2 in August - Placed on sliding scale insulin

## 2022-11-22 NOTE — Plan of Care (Signed)

## 2022-11-22 NOTE — Assessment & Plan Note (Signed)
-   Presented with tachycardia, leukocytosis and finding of left lower lobe pneumonia on chest x-ray - Patient appears to have aspiration risk.  She was placed on dysphasic 1 diet in the recent past. - Continue IV Rocephin and azithromycin

## 2022-11-22 NOTE — Progress Notes (Signed)
New Admission Note:   Arrival Method: bed Mental Orientation: disoriented x 4 Telemetry: box 14 Assessment: Completed Skin: C/D/I IV: right antecub and hand Pain: denies Tubes: N/A Safety Measures: Safety Fall Prevention Plan has been given, discussed and signed Admission: Completed [facility] Orientation: Patient has been orientated to the room, unit and staff.  Family:  Orders have been reviewed and implemented. Will continue to monitor the patient. Call light has been placed within reach and bed alarm has been activated.   Margarita Grizzle, RN

## 2022-11-22 NOTE — Assessment & Plan Note (Signed)
-   Controlled - Will need to verify medication with nursing facility in the morning

## 2022-11-22 NOTE — Assessment & Plan Note (Signed)
Miralax daily in the morning once mental status improves

## 2022-11-22 NOTE — Assessment & Plan Note (Addendum)
-   Patient drowsy on exam and only able to tell me that she is in the hospital.  Unclear of her baseline.

## 2022-11-23 DIAGNOSIS — A419 Sepsis, unspecified organism: Secondary | ICD-10-CM | POA: Diagnosis not present

## 2022-11-23 LAB — URINE CULTURE: Culture: >=100000 — AB

## 2022-11-23 LAB — GLUCOSE, CAPILLARY
Glucose-Capillary: 244 mg/dL — ABNORMAL HIGH (ref 70–99)
Glucose-Capillary: 152 mg/dL — ABNORMAL HIGH (ref 70–99)
Glucose-Capillary: 137 mg/dL — ABNORMAL HIGH (ref 70–99)
Glucose-Capillary: 204 mg/dL — ABNORMAL HIGH (ref 70–99)

## 2022-11-23 MED ORDER — GLUCERNA SHAKE PO LIQD
237.0000 mL | Freq: Three times a day (TID) | ORAL | Status: DC
  Administered 2022-11-23 – 2022-11-25 (×5): 237 mL via ORAL

## 2022-11-23 NOTE — Plan of Care (Signed)
  Problem: Education: Goal: Ability to describe self-care measures that may prevent or decrease complications (Diabetes Survival Skills Education) will improve Outcome: Progressing Goal: Individualized Educational Video(s) Outcome: Progressing   Problem: Coping: Goal: Ability to adjust to condition or change in health will improve Outcome: Progressing   Problem: Fluid Volume: Goal: Ability to maintain a balanced intake and output will improve Outcome: Progressing   Problem: Health Behavior/Discharge Planning: Goal: Ability to identify and utilize available resources and services will improve Outcome: Progressing Goal: Ability to manage health-related needs will improve Outcome: Progressing   Problem: Metabolic: Goal: Ability to maintain appropriate glucose levels will improve Outcome: Progressing   Problem: Nutritional: Goal: Maintenance of adequate nutrition will improve Outcome: Progressing Goal: Progress toward achieving an optimal weight will improve Outcome: Progressing   Problem: Skin Integrity: Goal: Risk for impaired skin integrity will decrease Outcome: Progressing   Problem: Tissue Perfusion: Goal: Adequacy of tissue perfusion will improve Outcome: Progressing   Problem: Education: Goal: Knowledge of General Education information will improve Description: Including pain rating scale, medication(s)/side effects and non-pharmacologic comfort measures Outcome: Progressing   Problem: Health Behavior/Discharge Planning: Goal: Ability to manage health-related needs will improve Outcome: Progressing   Problem: Clinical Measurements: Goal: Ability to maintain clinical measurements within normal limits will improve Outcome: Progressing Goal: Will remain free from infection Outcome: Progressing Goal: Diagnostic test results will improve Outcome: Progressing Goal: Respiratory complications will improve Outcome: Progressing Goal: Cardiovascular complication will  be avoided Outcome: Progressing   Problem: Activity: Goal: Risk for activity intolerance will decrease Outcome: Progressing   Problem: Nutrition: Goal: Adequate nutrition will be maintained Outcome: Progressing   Problem: Coping: Goal: Level of anxiety will decrease Outcome: Progressing   Problem: Elimination: Goal: Will not experience complications related to bowel motility Outcome: Progressing Goal: Will not experience complications related to urinary retention Outcome: Progressing   Problem: Pain Management: Goal: General experience of comfort will improve Outcome: Progressing   Problem: Skin Integrity: Goal: Risk for impaired skin integrity will decrease Outcome: Progressing

## 2022-11-23 NOTE — Progress Notes (Signed)
PROGRESS NOTE    Allison [name]  ZOX:[id number] DOB: 05-07-1934 DOA: 11/21/2022 PCP: [facility]   Brief Narrative: This 88 y.o. female with medical history significant of HTN, HLD, CVA, insulin-dependent Type 2 DM, hypothyroidism, dementia who presents with altered mental status and recent fall. As per report Patient had a fall 5 days ago but was never evaluated. She presented today with increased altered mentation but unclear what is her baseline with history of dementia. She is found to have leukocytosis, hypernatremia with a sodium of 155. Chest x-ray showed slight hazy left lower lobe opacities. CT head is negative. Patient empirically started on ceftriaxone and Zithromax. Patient is admitted for sepsis likely secondary to pneumonia.    Assessment & Plan:   Principal Problem:   Sepsis (HCC) Active Problems:   DKA, type 2 (HCC)   Hypernatremia   Alzheimer's dementia (HCC)   Essential hypertension   CAP (community acquired pneumonia)   Constipation  Sepsis secondary to pneumonia: Patient presented with tachycardia, leukocytosis and left lower lobe pneumonia on chest x-ray Patient appears to have aspiration risk.  She was placed on dysphasic 1 diet in the recent past. Continue IV Rocephin and azithromycin   Uncontrolled type 2 diabetes: Uncontrolled, last hemoglobin A1c of 11.2 in August Continue regular insulin sliding scale.   Hypernatremia: Likely due to decreased intake Has received 1 L of normal saline bolus in the ED.  Continue D5W continuous infusion Follow-up serum sodium in the morning.   Alzheimer's dementia (HCC) Unclear of patient's baseline mental status.   Constipation: Continue MiraLAX daily.   Essential hypertension Blood pressure well-controlled. Continue amlodipine 10 mg daily.   DVT prophylaxis: Lovenox Code Status: Full code Family Communication: No family at bed side. Disposition Plan:     Status is:  Inpatient Remains inpatient appropriate because: Admitted for sepsis secondary to pneumonia    Consultants:  None  Procedures:   Antimicrobials:  Anti-infectives (From admission, onward)    Start     Dose/Rate Route Frequency Ordered Stop   11/23/22 0000  azithromycin (ZITHROMAX) 500 mg in dextrose 5 % 250 mL IVPB        500 mg 250 mL/hr over 60 Minutes Intravenous Every 24 hours 11/22/22 0034 11/27/22 2359   11/22/22 2200  cefTRIAXone (ROCEPHIN) 1 g in sodium chloride 0.9 % 100 mL IVPB        1 g 200 mL/hr over 30 Minutes Intravenous Every 24 hours 11/22/22 0034 11/27/22 2159   11/21/22 2315  cefTRIAXone (ROCEPHIN) 1 g in sodium chloride 0.9 % 100 mL IVPB        1 g 200 mL/hr over 30 Minutes Intravenous  Once 11/21/22 2302 11/22/22 0132   11/21/22 2315  azithromycin (ZITHROMAX) 500 mg in dextrose 5 % 250 mL IVPB        500 mg 250 mL/hr over 60 Minutes Intravenous  Once 11/21/22 2302 11/22/22 0244      Subjective: Patient was seen and examined at bedside.  Overnight events noted. Patient seems improved from yesterday.  She is following commands.  Objective: Vitals:   11/22/22 1946 11/22/22 1956 11/23/22 0502 11/23/22 1043  BP: (!) 121/57 (!) 114/59 (!) 143/59 (!) 131/59  Pulse: 97 96 92 86  Resp: 18  18 18   Temp: 98.5 F (36.9 C) 98.4 F (36.9 C) 98.6 F (37 C) 98.4 F (36.9 C)  TempSrc: Oral     SpO2: 96% 99% 99% 100%  Weight:  Height:        Intake/Output Summary (Last 24 hours) at 11/23/2022 1437 Last data filed at 11/23/2022 0700 Gross per 24 hour  Intake 1084.67 ml  Output --  Net 1084.67 ml   Filed Weights   11/22/22 0020 11/22/22 1214  Weight: 58 kg 57 kg    Examination:  General exam: Appears calm and comfortable, deconditioned, not in any acute distress. Respiratory system: CTA bilaterally. Respiratory effort normal. RR 14 Cardiovascular system: S1 & S2 heard, RRR. No JVD, murmurs, rubs, gallops or clicks. No pedal edema. Gastrointestinal  system: Abdomen is nondistended, soft and nontender. Normal bowel sounds heard. Central nervous system: Alert and oriented X 1. No focal neurological deficits. Extremities: Symmetric 5 x 5 power. Skin: No rashes, lesions or ulcers Psychiatry: Mood & affect appropriate.     Data Reviewed: I have personally reviewed following labs and imaging studies  CBC: Recent Labs  Lab 11/21/22 2109 11/22/22 0409  WBC 12.1* 11.2*  NEUTROABS 7.5  --   HGB 12.0 10.8*  HCT 39.1 35.0*  MCV 85.0 84.5  PLT 282 246   Basic Metabolic Panel: Recent Labs  Lab 11/21/22 2109 11/22/22 0409  NA 155* 155*  K 4.2 4.0  CL 121* 120*  CO2 23 23  GLUCOSE 121* 194*  BUN 36* 32*  CREATININE 1.32* 1.29*  CALCIUM 10.3 9.7   GFR: Estimated Creatinine Clearance: 27.5 mL/min (A) (by C-G formula based on SCr of 1.29 mg/dL (H)). Liver Function Tests: Recent Labs  Lab 11/21/22 2109  AST 20  ALT 14  ALKPHOS 68  BILITOT 0.6  PROT 6.7  ALBUMIN 3.1*   No results for input(s): "LIPASE", "AMYLASE" in the last 168 hours. Recent Labs  Lab 11/21/22 2109  AMMONIA 16   Coagulation Profile: No results for input(s): "INR", "PROTIME" in the last 168 hours. Cardiac Enzymes: No results for input(s): "CKTOTAL", "CKMB", "CKMBINDEX", "TROPONINI" in the last 168 hours. BNP (last 3 results) No results for input(s): "PROBNP" in the last 8760 hours. HbA1C: No results for input(s): "HGBA1C" in the last 72 hours. CBG: Recent Labs  Lab 11/22/22 1224 11/22/22 1630 11/22/22 2122 11/23/22 0700 11/23/22 1128  GLUCAP 225* 98 224* 244* 152*   Lipid Profile: No results for input(s): "CHOL", "HDL", "LDLCALC", "TRIG", "CHOLHDL", "LDLDIRECT" in the last 72 hours. Thyroid Function Tests: Recent Labs    11/21/22 2109  TSH 0.174*  FREET4 1.63*   Anemia Panel: No results for input(s): "VITAMINB12", "FOLATE", "FERRITIN", "TIBC", "IRON", "RETICCTPCT" in the last 72 hours. Sepsis Labs: Recent Labs  Lab 11/21/22 2130   LATICACIDVEN 1.6    Recent Results (from the past 240 hour(s))  Resp panel by RT-PCR (RSV, Flu A&B, Covid) Anterior Nasal Swab     Status: None   Collection Time: 11/21/22  7:55 PM   Specimen: Anterior Nasal Swab  Result Value Ref Range Status   SARS Coronavirus 2 by RT PCR NEGATIVE NEGATIVE Final   Influenza A by PCR NEGATIVE NEGATIVE Final   Influenza B by PCR NEGATIVE NEGATIVE Final    Comment: (NOTE) The Xpert Xpress SARS-CoV-2/FLU/RSV plus assay is intended as an aid in the diagnosis of influenza from Nasopharyngeal swab specimens and should not be used as a sole basis for treatment. Nasal washings and aspirates are unacceptable for Xpert Xpress SARS-CoV-2/FLU/RSV testing.  Fact Sheet for Patients: BloggerCourse.com  Fact Sheet for Healthcare Providers: SeriousBroker.it  This test is not yet approved or cleared by the [place] and has been authorized  for detection and/or diagnosis of SARS-CoV-2 by FDA under an Emergency Use Authorization (EUA). This EUA will remain in effect (meaning this test can be used) for the duration of the COVID-19 declaration under Section 564(b)(1) of the Act, 21 U.S.C. section 360bbb-3(b)(1), unless the authorization is terminated or revoked.     Resp Syncytial Virus by PCR NEGATIVE NEGATIVE Final    Comment: (NOTE) Fact Sheet for Patients: BloggerCourse.com  Fact Sheet for Healthcare Providers: SeriousBroker.it  This test is not yet approved or cleared by the [place] FDA and has been authorized for detection and/or diagnosis of SARS-CoV-2 by FDA under an Emergency Use Authorization (EUA). This EUA will remain in effect (meaning this test can be used) for the duration of the COVID-19 declaration under Section 564(b)(1) of the Act, 21 U.S.C. section 360bbb-3(b)(1), unless the authorization is terminated  or revoked.  Performed at [facility], [address] [place], [place] [address]   Blood culture (routine x 2)     Status: None (Preliminary result)   Collection Time: 11/21/22  9:06 PM   Specimen: BLOOD  Result Value Ref Range Status   Specimen Description BLOOD RIGHT ANTECUBITAL  Final   Special Requests   Final    BOTTLES DRAWN AEROBIC AND ANAEROBIC Blood Culture adequate volume   Culture   Final    NO GROWTH 2 DAYS Performed at [facility], [address] [place], [place] [address]    Report Status PENDING  Incomplete  Blood culture (routine x 2)     Status: None (Preliminary result)   Collection Time: 11/21/22  9:09 PM   Specimen: BLOOD  Result Value Ref Range Status   Specimen Description BLOOD LEFT ANTECUBITAL  Final   Special Requests   Final    BOTTLES DRAWN AEROBIC AND ANAEROBIC Blood Culture results may not be optimal due to an inadequate volume of blood received in culture bottles   Culture   Final    NO GROWTH 2 DAYS Performed at [facility], [address] [place], [place] [address]    Report Status PENDING  Incomplete  Urine Culture     Status: Abnormal   Collection Time: 11/22/22 12:02 AM   Specimen: Urine, Random  Result Value Ref Range Status   Specimen Description URINE, RANDOM  Final   Special Requests   Final    NONE Reflexed from [id number] Performed at [facility], [address] [place], [place] [address]    Culture >=100,000 COLONIES/mL YEAST (A)  Final   Report Status 11/23/2022 FINAL  Final    Radiology Studies: CT ABDOMEN PELVIS W CONTRAST  Result Date: 11/22/2022 CLINICAL DATA:  Left lower quadrant abdominal pain. EXAM: CT ABDOMEN AND PELVIS WITH CONTRAST TECHNIQUE: Multidetector CT imaging of the abdomen and pelvis was performed using the standard protocol following bolus administration of intravenous contrast. RADIATION DOSE REDUCTION: This exam was performed according to the departmental dose-optimization  program which includes automated exposure control, adjustment of the mA and/or kV according to patient size and/or use of iterative reconstruction technique. CONTRAST:  60mL OMNIPAQUE IOHEXOL 350 MG/ML SOLN COMPARISON:  08/25/2022. FINDINGS: Lower chest: Dependent atelectasis is noted at the lung bases. Multiple pulmonary nodules are seen bilaterally measuring up to 7 mm in the left lower lobe, axial image 11. Hepatobiliary: No focal liver abnormality is seen. No gallstones, gallbladder wall thickening, or biliary dilatation. Pancreas: Unremarkable. No pancreatic ductal dilatation or surrounding inflammatory changes. Spleen: Normal in size  without focal abnormality. Adrenals/Urinary Tract: The adrenal glands are within normal limits. The kidneys enhance symmetrically. Cysts are present in the kidneys bilaterally. No renal calculus or hydronephrosis is seen. The bladder is unremarkable. Stomach/Bowel: Stomach is within normal limits. Appendix is not seen. No evidence of bowel wall thickening, distention, or inflammatory changes. No free air or pneumatosis. Few scattered diverticula are present along the colon without evidence of diverticulitis. A moderate amount of retained stool is present in the colon and rectum. Vascular/Lymphatic: Aortic atherosclerosis. No enlarged abdominal or pelvic lymph nodes. Reproductive: Status post hysterectomy. No adnexal masses. Other: No abdominopelvic ascites. Musculoskeletal: Degenerative changes are present in the thoracolumbar spine. No acute osseous abnormality is seen. IMPRESSION: 1. No acute intra-abdominal process. 2. Moderate amount of retained stool in the colon and rectum suggesting constipation. 3. Diverticulosis without diverticulitis. 4. Stable scattered pulmonary nodules at the lung bases measuring up to 7 mm, unchanged from 2019 and likely benign. 5. Aortic atherosclerosis. Electronically Signed   By: Thornell Sartorius M.D.   On: 11/22/2022 00:27   DG Hip Unilat W or Wo  Pelvis 2-3 Views Right  Result Date: 11/22/2022 CLINICAL DATA:  Fall EXAM: DG HIP (WITH OR WITHOUT PELVIS) 2-3V RIGHT COMPARISON:  None Available. FINDINGS: There is no evidence of hip fracture or dislocation. Degenerative changes pubic symphysis, both hips, SI joints and lower lumbar spine. IMPRESSION: No acute fracture or dislocation. Electronically Signed   By: Minerva Fester M.D.   On: 11/22/2022 00:02   CT Head Wo Contrast  Result Date: 11/21/2022 CLINICAL DATA:  Head trauma, altered mental status. Fall from bed 5 days ago. EXAM: CT HEAD WITHOUT CONTRAST TECHNIQUE: Contiguous axial images were obtained from the base of the skull through the vertex without intravenous contrast. RADIATION DOSE REDUCTION: This exam was performed according to the departmental dose-optimization program which includes automated exposure control, adjustment of the mA and/or kV according to patient size and/or use of iterative reconstruction technique. COMPARISON:  02/29/2023. FINDINGS: Brain: No acute intracranial hemorrhage, midline shift or mass effect. No extra-axial fluid collection. Diffuse atrophy is noted. Periventricular white matter hypodensities are present bilaterally. No hydrocephalus. Hypodensity is noted in the lentiform nucleus on the left suggesting old infarct. Vascular: No hyperdense vessel or unexpected calcification. Skull: Normal. Negative for fracture or focal lesion. Sinuses/Orbits: No acute finding. Other: None. IMPRESSION: 1. No acute intracranial process. 2. Atrophy with chronic microvascular ischemic changes and old infarct. Electronically Signed   By: Thornell Sartorius M.D.   On: 11/21/2022 21:22   DG Chest Portable 1 View  Result Date: 11/21/2022 CLINICAL DATA:  Altered mental status EXAM: PORTABLE CHEST 1 VIEW COMPARISON:  Radiograph 08/24/2022 and CT chest 08/25/2022 FINDINGS: Airspace opacities in the left lower lung. The lungs are otherwise clear. Stable cardiomediastinal silhouette. Aortic  atherosclerotic calcification. IMPRESSION: Airspace opacities in the left lower lung may represent atelectasis, pneumonia, or aspiration. Electronically Signed   By: Minerva Fester M.D.   On: 11/21/2022 21:12    Scheduled Meds:  enoxaparin (LOVENOX) injection  30 mg Subcutaneous Q24H   insulin aspart  0-15 Units Subcutaneous TID WC   polyethylene glycol  17 g Oral Daily   Continuous Infusions:  azithromycin 500 mg (11/23/22 0039)   cefTRIAXone (ROCEPHIN)  IV 1 g (11/22/22 2117)     LOS: 1 day    Time spent: 50 mins    Willeen Niece, MD [facility]   If 7PM-7AM, please contact night-coverage

## 2022-11-23 NOTE — TOC Initial Note (Signed)
Transition of Care [facility]) - Initial/Assessment Note    Patient Details  Name: Allison [name] MRN: [id number] Date of Birth: Nov 09, 1934  [facility]) CM/SW Contact:    Mearl Latin, LCSW Phone Number: 11/23/2022, 2:40 PM  Clinical Narrative:                 CSW spoke with French Ana at [facility]. She reported patient's SW is Marylu Lund. She confirmed patient is under long term care at [facility] and will return at discharge.   Expected Discharge Plan: Skilled Nursing Facility Barriers to Discharge: Continued Medical Work up   Patient Goals and CMS Choice Patient states their goals for this hospitalization and ongoing recovery are:: Return to SNF          Expected Discharge Plan and Services In-house Referral: Clinical Social Work   Post Acute Care Choice: Skilled Nursing Facility Living arrangements for the past 2 months: Skilled Nursing Facility                                      Prior Living Arrangements/Services Living arrangements for the past 2 months: Skilled Nursing Facility Lives with:: Facility Resident Patient language and need for interpreter reviewed:: Yes Do you feel safe going back to the place where you live?: Yes      Need for Family Participation in Patient Care: Yes (Comment) Care giver support system in place?: Yes (comment)   Criminal Activity/Legal Involvement Pertinent to Current Situation/Hospitalization: No - Comment as needed  Activities of Daily Living   ADL Screening (condition at time of admission) Independently performs ADLs?: No Does the patient have a NEW difficulty with bathing/dressing/toileting/self-feeding that is expected to last >3 days?: Yes (Initiates electronic notice to provider for possible OT consult) Does the patient have a NEW difficulty with getting in/out of bed, walking, or climbing stairs that is expected to last >3 days?: Yes (Initiates electronic notice to provider for possible PT consult) Does the  patient have a NEW difficulty with communication that is expected to last >3 days?: Yes (Initiates electronic notice to provider for possible SLP consult) Is the patient deaf or have difficulty hearing?: No Does the patient have difficulty seeing, even when wearing glasses/contacts?: No Does the patient have difficulty concentrating, remembering, or making decisions?: Yes  Permission Sought/Granted Permission sought to share information with : Facility Medical sales representative, Family Supports Permission granted to share information with : No  Share Information with NAME: Tinnie Gens  Permission granted to share info w AGENCY: [name]  Permission granted to share info w Relationship: Son  Permission granted to share info w Contact Information: [phone]  Emotional Assessment Appearance:: Appears stated age Attitude/Demeanor/Rapport: Unable to Assess Affect (typically observed): Unable to Assess Orientation: :  (Disoriented x4) Alcohol / Substance Use: Not Applicable Psych Involvement: No (comment)  Admission diagnosis:  Sepsis [facility]) [A41.9] Patient Active Problem List   Diagnosis Date Noted   Sepsis (HCC) 11/22/2022   Constipation 11/22/2022   Malnutrition of moderate degree 08/27/2022   AKI (acute kidney injury) (HCC) 08/24/2022   Sepsis secondary to UTI (HCC) 08/24/2022   Acute metabolic encephalopathy 08/24/2022   Diabetic ketoacidosis (HCC) 08/16/2022   DKA, type 2 (HCC) 08/15/2022   Alzheimer's dementia (HCC) 08/15/2022   Hypernatremia 08/15/2022   CAP (community acquired pneumonia) 01/07/2021   Family discord 03/28/2019   Neurocognitive deficits 01/04/2018   History of stroke 10/01/2016  Anxiety state 06/03/2016   Visual hallucinations 10/28/2015   CVA (cerebral vascular accident) (HCC) 10/28/2015   Ischemic stroke (HCC)    Hx of adenomatous colonic polyps 06/19/2014   Neuropathic pain 03/27/2014   Uncontrolled type 2 diabetes mellitus with hyperglycemia, without  long-term current use of insulin (HCC) 03/27/2014   Bilateral carpal tunnel syndrome 03/27/2014   Dehydration 08/05/2013   Diabetes mellitus with hyperglycemia (HCC) 08/05/2013   Hypercalcemia 08/05/2013   PVD (peripheral vascular disease) (HCC) 03/05/2012   Onychomycosis 03/05/2012   Other hammer toe (acquired) 03/05/2012   Abnormal CXR 12/25/2011   Cystocele 06/11/2011   Incontinence 06/04/2011   Depression 06/04/2011   Hypothyroidism 06/23/2006   Dyslipidemia 06/23/2006   Essential hypertension 06/23/2006   GERD 06/23/2006   IBS 06/23/2006   PCP:  [facility] Pharmacy:   [facility] - [place], [place] - [address] [place] [address] Phone: [phone] Fax: [phone]     Social Determinants of Health (SDOH) Social History: SDOH Screenings   Food Insecurity: No Food Insecurity (11/22/2022)  Housing: Low Risk  (11/22/2022)  Transportation Needs: No Transportation Needs (11/22/2022)  Utilities: Not At Risk (11/22/2022)  Alcohol Screen: Low Risk  (10/11/2017)  Financial Resource Strain: Low Risk  (06/28/2017)  Physical Activity: Insufficiently Active (06/28/2017)  Social Connections: Somewhat Isolated (06/28/2017)  Stress: Stress Concern Present (06/28/2017)  Tobacco Use: Low Risk  (11/21/2022)   SDOH Interventions:     Readmission Risk Interventions    08/18/2022   12:38 PM  Readmission Risk Prevention Plan  Transportation Screening Complete  PCP or Specialist Appt within 5-7 Days Complete  Home Care Screening Complete  Medication Review (RN CM) Complete

## 2022-11-23 NOTE — Progress Notes (Signed)
PT Cancellation Note  Patient Details Name: Allison [name] MRN: [id number] DOB: [birth year]   Cancelled Treatment:    Reason Eval/Treat Not Completed: PT screened, no needs identified, will sign off  Noted pt from LTC with h/o dementia. Contacted MD and PT eval no longer requested.    Jerolyn Center, PT Acute Rehabilitation Services  Office [phone]  Zena Amos 11/23/2022, 3:16 PM

## 2022-11-24 DIAGNOSIS — A419 Sepsis, unspecified organism: Secondary | ICD-10-CM | POA: Diagnosis not present

## 2022-11-24 LAB — BASIC METABOLIC PANEL
Anion gap: 9 (ref 5–15)
BUN: 18 mg/dL (ref 8–23)
CO2: 23 mmol/L (ref 22–32)
Calcium: 9.3 mg/dL (ref 8.9–10.3)
Chloride: 119 mmol/L — ABNORMAL HIGH (ref 98–111)
Creatinine, Ser: 1.04 mg/dL — ABNORMAL HIGH (ref 0.44–1.00)
GFR, Estimated: 53 mL/min — ABNORMAL LOW (ref >60)
Glucose, Bld: 197 mg/dL — ABNORMAL HIGH (ref 70–99)
Potassium: 4.6 mmol/L (ref 3.5–5.1)
Sodium: 151 mmol/L — ABNORMAL HIGH (ref 135–145)

## 2022-11-24 LAB — CBC
HCT: 37.8 % (ref 36.0–46.0)
Hemoglobin: 12.3 g/dL (ref 12.0–15.0)
MCH: 27 pg (ref 26.0–34.0)
MCHC: 32.5 g/dL (ref 30.0–36.0)
MCV: 82.9 fL (ref 80.0–100.0)
Platelets: 201 10*3/uL (ref 150–400)
RBC: 4.56 MIL/uL (ref 3.87–5.11)
RDW: 15.9 % — ABNORMAL HIGH (ref 11.5–15.5)
WBC: 8.6 10*3/uL (ref 4.0–10.5)
nRBC: 0 % (ref 0.0–0.2)

## 2022-11-24 LAB — GLUCOSE, CAPILLARY
Glucose-Capillary: 226 mg/dL — ABNORMAL HIGH (ref 70–99)
Glucose-Capillary: 240 mg/dL — ABNORMAL HIGH (ref 70–99)
Glucose-Capillary: 255 mg/dL — ABNORMAL HIGH (ref 70–99)
Glucose-Capillary: 208 mg/dL — ABNORMAL HIGH (ref 70–99)

## 2022-11-24 LAB — MAGNESIUM: Magnesium: 2 mg/dL (ref 1.7–2.4)

## 2022-11-24 LAB — PHOSPHORUS: Phosphorus: 3 mg/dL (ref 2.5–4.6)

## 2022-11-24 MED ORDER — FLUCONAZOLE 100 MG PO TABS
100.0000 mg | ORAL_TABLET | Freq: Every day | ORAL | Status: DC
  Administered 2022-11-24 – 2022-11-25 (×2): 100 mg via ORAL
  Filled 2022-11-24 (×2): qty 1

## 2022-11-24 MED ORDER — AZITHROMYCIN 500 MG PO TABS
500.0000 mg | ORAL_TABLET | Freq: Every day | ORAL | Status: DC
  Administered 2022-11-24: 500 mg via ORAL
  Filled 2022-11-24: qty 1

## 2022-11-24 NOTE — Progress Notes (Signed)
Speech Language Pathology Treatment: Dysphagia  Patient Details Name: Allison [name] MRN: [id number] DOB: 1934/03/22 Today's Date: 11/24/2022 Time: 4098-1191 SLP Time Calculation (min) (ACUTE ONLY): 8 min  Assessment / Plan / Recommendation Clinical Impression  Pt was seen for skilled ST targeting diagnostic treatment following limited clinical swallow evaluation this morning.  Pt was more alert this afternoon and was able to consume trials of thin liquid and pureed solids.  She exhibited an immediate cough in 1/10 trials of thin liquid when consuming large serial sips, otherwise no overt s/sx of aspiration were observed with PO trials.  She was noted to have infrequent L anterior labial spillage with liquid.  Pt was unable to follow commands to complete an oral mechanism examination, but labial spillage may be indicative of L labial weakness.  Pt with prolonged lingual manipulation and AP transit of puree with suspected delayed swallow initiation.  Recommend continuation of Dysphagia 1 (puree) solids and thin liquids with medication administered crushed in puree.  Pt will require full supervision and assistance for PO intake.     HPI HPI: This 88 y.o. female with medical history significant of HTN, HLD, CVA, insulin-dependent Type 2 DM, hypothyroidism, dementia who presents with altered mental status and recent fall. As per report Patient had a fall 5 days ago but was never evaluated. She presented today with increased altered mentation but unclear what is her baseline with history of dementia. She is found to have leukocytosis, hypernatremia with a sodium of 155. Chest x-ray showed slight hazy left lower lobe opacities with concerns for PNA or aspiration. CT head is negative. Patient empirically started on ceftriaxone and Zithromax. Patient is admitted for sepsis likely secondary to pneumonia. Pt has a hx of cognitive dysphagia with most recent diet recommendations for Dysphagia 1 solids and thin  liquids.      SLP Plan  Continue with current plan of care  Patient does not need any further Speech Lanaguage Pathology Services (for cognitive-linguistic deficits)   Recommendations for follow up therapy are one component of a multi-disciplinary discharge planning process, led by the attending physician.  Recommendations may be updated based on patient status, additional functional criteria and insurance authorization.    Recommendations  Diet recommendations: Dysphagia 1 (puree);Thin liquid Liquids provided via: Straw Medication Administration: Crushed with puree Supervision: Full supervision/cueing for compensatory strategies;Trained caregiver to feed patient;Staff to assist with self feeding Compensations: Minimize environmental distractions;Slow rate;Small sips/bites                  Oral care BID     Dysphagia, unspecified (R13.10)     Continue with current plan of care    Allison [name], M.S., CCC-SLP Acute Rehabilitation Services Office: [phone]  Allison [name] [facility]  11/24/2022, 1:06 PM

## 2022-11-24 NOTE — Progress Notes (Signed)
PROGRESS NOTE    Allison [name]  WUJ:[id number] DOB: Jun 12, 1934 DOA: 11/21/2022 PCP: [facility]   Brief Narrative: This 88 y.o. female with medical history significant of HTN, HLD, CVA, insulin-dependent Type 2 DM, hypothyroidism, dementia who presents with altered mental status and recent fall. As per report Patient had a fall 5 days ago but was never evaluated. She presented today with increased altered mentation but unclear what is her baseline with history of dementia. She is found to have leukocytosis, hypernatremia with a sodium of 155. Chest x-ray showed slight hazy left lower lobe opacities. CT head is negative. Patient empirically started on ceftriaxone and Zithromax. Patient is admitted for sepsis likely secondary to pneumonia.    Assessment & Plan:   Principal Problem:   Sepsis (HCC) Active Problems:   DKA, type 2 (HCC)   Hypernatremia   Alzheimer's dementia (HCC)   Essential hypertension   CAP (community acquired pneumonia)   Constipation  Sepsis secondary to pneumonia: Patient presented with tachycardia, leukocytosis and left lower lobe pneumonia on chest x-ray Patient appears to have aspiration risk.  She was placed on dysphasic 1 diet in the recent past. Continue IV Rocephin and azithromycin. Patient was given IV fluids in the ED. Sepsis physiology improving.   Uncontrolled type 2 diabetes: Uncontrolled, last hemoglobin A1c of 11.2 in August Continue regular insulin sliding scale.   Hypernatremia: Likely due to decreased intake. Has received 1 L of normal saline bolus in the ED.  Continue D5W continuous infusion Serum sodium improving.  Continue to monitor.   Alzheimer's dementia (HCC) She appears back to her baseline mental status. She is alert, oriented x 1, following commands.   Constipation: Continue MiraLAX daily.   Essential hypertension Blood pressure well-controlled. Continue amlodipine 10 mg daily.   DVT  prophylaxis: Lovenox Code Status: Full code Family Communication: No family at bed side. Disposition Plan:     Status is: Inpatient Remains inpatient appropriate because: Admitted for sepsis secondary to pneumonia, requiring IV antibiotics. She seems back to her baseline mental status.  She needs PT and OT evaluation for disposition     Consultants:  None  Procedures:   Antimicrobials:  Anti-infectives (From admission, onward)    Start     Dose/Rate Route Frequency Ordered Stop   11/24/22 2200  azithromycin (ZITHROMAX) tablet 500 mg        500 mg Oral Daily at bedtime 11/24/22 1305 11/26/22 2159   11/24/22 1030  fluconazole (DIFLUCAN) tablet 100 mg        100 mg Oral Daily 11/24/22 0935 11/27/22 0959   11/23/22 0000  azithromycin (ZITHROMAX) 500 mg in dextrose 5 % 250 mL IVPB  Status:  Discontinued        500 mg 250 mL/hr over 60 Minutes Intravenous Every 24 hours 11/22/22 0034 11/24/22 1305   11/22/22 2200  cefTRIAXone (ROCEPHIN) 1 g in sodium chloride 0.9 % 100 mL IVPB        1 g 200 mL/hr over 30 Minutes Intravenous Every 24 hours 11/22/22 0034 11/27/22 2159   11/21/22 2315  cefTRIAXone (ROCEPHIN) 1 g in sodium chloride 0.9 % 100 mL IVPB        1 g 200 mL/hr over 30 Minutes Intravenous  Once 11/21/22 2302 11/22/22 0132   11/21/22 2315  azithromycin (ZITHROMAX) 500 mg in dextrose 5 % 250 mL IVPB        500 mg 250 mL/hr over 60 Minutes Intravenous  Once 11/21/22  2302 11/22/22 0244      Subjective: Patient was seen and examined at bedside.  Overnight events noted. Patient seems much improved back to her baseline mental status.   Objective: Vitals:   11/23/22 1712 11/23/22 2304 11/24/22 0551 11/24/22 0909  BP: (!) 112/51 (!) 144/69 (!) 157/70 (!) 142/75  Pulse: 98 91 87 86  Resp: 18 18 17 18   Temp: 98.1 F (36.7 C) 98.5 F (36.9 C) 98.1 F (36.7 C) 97.8 F (36.6 C)  TempSrc:    Oral  SpO2: 100% 100% 99% 99%  Weight:      Height:        Intake/Output  Summary (Last 24 hours) at 11/24/2022 1346 Last data filed at 11/24/2022 0500 Gross per 24 hour  Intake 0 ml  Output 1000 ml  Net -1000 ml   Filed Weights   11/22/22 0020 11/22/22 1214  Weight: 58 kg 57 kg    Examination:  General exam: Appears calm and comfortable, deconditioned, not in any acute distress. Respiratory system: CTA bilaterally, respiratory effort normal, RR 13. Cardiovascular system: S1 & S2 heard, RRR. No JVD, murmurs, rubs, gallops or clicks. No pedal edema. Gastrointestinal system: Abdomen is non distended, soft and non tender. Normal bowel sounds heard. Central nervous system: Alert and oriented X 1. No focal neurological deficits. Extremities: Symmetric 5 x 5 power. Skin: No rashes, lesions or ulcers Psychiatry: Mood & affect appropriate.     Data Reviewed: I have personally reviewed following labs and imaging studies  CBC: Recent Labs  Lab 11/21/22 2109 11/22/22 0409 11/24/22 0857  WBC 12.1* 11.2* 8.6  NEUTROABS 7.5  --   --   HGB 12.0 10.8* 12.3  HCT 39.1 35.0* 37.8  MCV 85.0 84.5 82.9  PLT 282 246 201   Basic Metabolic Panel: Recent Labs  Lab 11/21/22 2109 11/22/22 0409 11/24/22 0409  NA 155* 155* 151*  K 4.2 4.0 4.6  CL 121* 120* 119*  CO2 23 23 23   GLUCOSE 121* 194* 197*  BUN 36* 32* 18  CREATININE 1.32* 1.29* 1.04*  CALCIUM 10.3 9.7 9.3  MG  --   --  2.0  PHOS  --   --  3.0   GFR: Estimated Creatinine Clearance: 34.2 mL/min (A) (by C-G formula based on SCr of 1.04 mg/dL (H)). Liver Function Tests: Recent Labs  Lab 11/21/22 2109  AST 20  ALT 14  ALKPHOS 68  BILITOT 0.6  PROT 6.7  ALBUMIN 3.1*   No results for input(s): "LIPASE", "AMYLASE" in the last 168 hours. Recent Labs  Lab 11/21/22 2109  AMMONIA 16   Coagulation Profile: No results for input(s): "INR", "PROTIME" in the last 168 hours. Cardiac Enzymes: No results for input(s): "CKTOTAL", "CKMB", "CKMBINDEX", "TROPONINI" in the last 168 hours. BNP (last 3  results) No results for input(s): "PROBNP" in the last 8760 hours. HbA1C: No results for input(s): "HGBA1C" in the last 72 hours. CBG: Recent Labs  Lab 11/23/22 1128 11/23/22 1558 11/23/22 2306 11/24/22 0727 11/24/22 1137  GLUCAP 152* 137* 204* 226* 240*   Lipid Profile: No results for input(s): "CHOL", "HDL", "LDLCALC", "TRIG", "CHOLHDL", "LDLDIRECT" in the last 72 hours. Thyroid Function Tests: Recent Labs    11/21/22 2109  TSH 0.174*  FREET4 1.63*   Anemia Panel: No results for input(s): "VITAMINB12", "FOLATE", "FERRITIN", "TIBC", "IRON", "RETICCTPCT" in the last 72 hours. Sepsis Labs: Recent Labs  Lab 11/21/22 2130  LATICACIDVEN 1.6    Recent Results (from the past 240 hour(s))  Resp panel by RT-PCR (RSV, Flu A&B, Covid) Anterior Nasal Swab     Status: None   Collection Time: 11/21/22  7:55 PM   Specimen: Anterior Nasal Swab  Result Value Ref Range Status   SARS Coronavirus 2 by RT PCR NEGATIVE NEGATIVE Final   Influenza A by PCR NEGATIVE NEGATIVE Final   Influenza B by PCR NEGATIVE NEGATIVE Final    Comment: (NOTE) The Xpert Xpress SARS-CoV-2/FLU/RSV plus assay is intended as an aid in the diagnosis of influenza from Nasopharyngeal swab specimens and should not be used as a sole basis for treatment. Nasal washings and aspirates are unacceptable for Xpert Xpress SARS-CoV-2/FLU/RSV testing.  Fact Sheet for Patients: BloggerCourse.com  Fact Sheet for Healthcare Providers: SeriousBroker.it  This test is not yet approved or cleared by the [place] FDA and has been authorized for detection and/or diagnosis of SARS-CoV-2 by FDA under an Emergency Use Authorization (EUA). This EUA will remain in effect (meaning this test can be used) for the duration of the COVID-19 declaration under Section 564(b)(1) of the Act, 21 U.S.C. section 360bbb-3(b)(1), unless the authorization is terminated or revoked.     Resp  Syncytial Virus by PCR NEGATIVE NEGATIVE Final    Comment: (NOTE) Fact Sheet for Patients: BloggerCourse.com  Fact Sheet for Healthcare Providers: SeriousBroker.it  This test is not yet approved or cleared by the [place] FDA and has been authorized for detection and/or diagnosis of SARS-CoV-2 by FDA under an Emergency Use Authorization (EUA). This EUA will remain in effect (meaning this test can be used) for the duration of the COVID-19 declaration under Section 564(b)(1) of the Act, 21 U.S.C. section 360bbb-3(b)(1), unless the authorization is terminated or revoked.  Performed at [facility], [address] [place], [place] [address]   Blood culture (routine x 2)     Status: None (Preliminary result)   Collection Time: 11/21/22  9:06 PM   Specimen: BLOOD  Result Value Ref Range Status   Specimen Description BLOOD RIGHT ANTECUBITAL  Final   Special Requests   Final    BOTTLES DRAWN AEROBIC AND ANAEROBIC Blood Culture adequate volume   Culture   Final    NO GROWTH 3 DAYS Performed at [facility], [address], [place], [place] [address]    Report Status PENDING  Incomplete  Blood culture (routine x 2)     Status: None (Preliminary result)   Collection Time: 11/21/22  9:09 PM   Specimen: BLOOD  Result Value Ref Range Status   Specimen Description BLOOD LEFT ANTECUBITAL  Final   Special Requests   Final    BOTTLES DRAWN AEROBIC AND ANAEROBIC Blood Culture results may not be optimal due to an inadequate volume of blood received in culture bottles   Culture   Final    NO GROWTH 3 DAYS Performed at [facility], [address], [place], [place] [address]    Report Status PENDING  Incomplete  Urine Culture     Status: Abnormal   Collection Time: 11/22/22 12:02 AM   Specimen: Urine, Random  Result Value Ref Range Status   Specimen Description URINE, RANDOM  Final   Special Requests   Final    NONE  Reflexed from [id number] Performed at [facility], [address] [place], [place] [address]    Culture >=100,000 COLONIES/mL YEAST (A)  Final   Report Status 11/23/2022 FINAL  Final    Radiology Studies: No results found.  Scheduled  Meds:  azithromycin  500 mg Oral QHS   enoxaparin (LOVENOX) injection  30 mg Subcutaneous Q24H   feeding supplement (GLUCERNA SHAKE)  237 mL Oral TID BM   fluconazole  100 mg Oral Daily   insulin aspart  0-15 Units Subcutaneous TID WC   polyethylene glycol  17 g Oral Daily   Continuous Infusions:  cefTRIAXone (ROCEPHIN)  IV 1 g (11/23/22 2200)     LOS: 2 days    Time spent: 35 mins    Willeen Niece, MD [facility]   If 7PM-7AM, please contact night-coverage

## 2022-11-24 NOTE — Evaluation (Signed)
Speech Language Pathology Evaluation Patient Details Name: Allison [name] MRN: [id number] DOB: 1934/05/16 Today's Date: 11/24/2022 Time: 7425-9563 SLP Time Calculation (min) (ACUTE ONLY): 12 min  Problem List:  Patient Active Problem List   Diagnosis Date Noted   Sepsis (HCC) 11/22/2022   Constipation 11/22/2022   Malnutrition of moderate degree 08/27/2022   AKI (acute kidney injury) (HCC) 08/24/2022   Sepsis secondary to UTI (HCC) 08/24/2022   Acute metabolic encephalopathy 08/24/2022   Diabetic ketoacidosis (HCC) 08/16/2022   DKA, type 2 (HCC) 08/15/2022   Alzheimer's dementia (HCC) 08/15/2022   Hypernatremia 08/15/2022   CAP (community acquired pneumonia) 01/07/2021   Family discord 03/28/2019   Neurocognitive deficits 01/04/2018   History of stroke 10/01/2016   Anxiety state 06/03/2016   Visual hallucinations 10/28/2015   CVA (cerebral vascular accident) (HCC) 10/28/2015   Ischemic stroke (HCC)    Hx of adenomatous colonic polyps 06/19/2014   Neuropathic pain 03/27/2014   Uncontrolled type 2 diabetes mellitus with hyperglycemia, without long-term current use of insulin (HCC) 03/27/2014   Bilateral carpal tunnel syndrome 03/27/2014   Dehydration 08/05/2013   Diabetes mellitus with hyperglycemia (HCC) 08/05/2013   Hypercalcemia 08/05/2013   PVD (peripheral vascular disease) (HCC) 03/05/2012   Onychomycosis 03/05/2012   Other hammer toe (acquired) 03/05/2012   Abnormal CXR 12/25/2011   Cystocele 06/11/2011   Incontinence 06/04/2011   Depression 06/04/2011   Hypothyroidism 06/23/2006   Dyslipidemia 06/23/2006   Essential hypertension 06/23/2006   GERD 06/23/2006   IBS 06/23/2006   Past Medical History:  Past Medical History:  Diagnosis Date   Acute ischemic stroke (HCC) 10/29/2015   Carpal tunnel syndrome    Dementia (HCC)    Diabetes mellitus without complication (HCC)    Dysphagia    GERD (gastroesophageal reflux disease)    Hiatal hernia    HLD  (hyperlipidemia)    Hx of adenomatous colonic polyps 06/19/2014   Hyperlipidemia    Hypertension    Hypothyroidism    Onychomycosis 03/05/2012   Pain in lower limb 04/18/2013   Sciatic nerve pain    Stroke-like symptoms 10/28/2015   Vertigo    Past Surgical History:  Past Surgical History:  Procedure Laterality Date   BLADDER SURGERY     Mesh implant   COLONOSCOPY     LARYNX SURGERY     vocal cords   PARTIAL HYSTERECTOMY  1978   VESICOVAGINAL FISTULA CLOSURE W/ TAH  1976   HPI:  This 88 y.o. female with medical history significant of HTN, HLD, CVA, insulin-dependent Type 2 DM, hypothyroidism, dementia who presents with altered mental status and recent fall. As per report Patient had a fall 5 days ago but was never evaluated. She presented today with increased altered mentation but unclear what is her baseline with history of dementia. She is found to have leukocytosis, hypernatremia with a sodium of 155. Chest x-ray showed slight hazy left lower lobe opacities with concerns for PNA or aspiration. CT head is negative. Patient empirically started on ceftriaxone and Zithromax. Patient is admitted for sepsis likely secondary to pneumonia. Pt has a hx of cognitive dysphagia with most recent diet recommendations for Dysphagia 1 solids and thin liquids.   Assessment / Plan / Recommendation Clinical Impression  Pt presents with a cognitive-linguistic impairment c/b by deficits in attention, functional problem solving, executive functioning, and expressive/receptive language.  Pt was noted to have low vocal intensity and intermittently responded in single words or short phrases to simple and directed questions.  She was  oriented to self, but not to place, situation, or year.  She was unable to follow commands on this date.  Suspect that cognitive-linguistic deficits are baseline in the setting of documented dementia; however, no family or friends were present to help establish the pt's baseline.   No additional ST is recommended targeting cognitive-linguistic deficits as it is likely the pt's baseline; however, ST will continue to f/u to monitor diet tolerance and assess aspiration risk.    SLP Assessment  SLP Recommendation/Assessment: Patient does not need any further Speech Lanaguage Pathology Services (for cognitive-linguistic deficits) SLP Visit Diagnosis: Cognitive communication deficit (R41.841)    Recommendations for follow up therapy are one component of a multi-disciplinary discharge planning process, led by the attending physician.  Recommendations may be updated based on patient status, additional functional criteria and insurance authorization.    Follow Up Recommendations  Long-term institutional care without follow-up therapy    Assistance Recommended at Discharge     Functional Status Assessment Patient has had a recent decline in their functional status and/or demonstrates limited ability to make significant improvements in function in a reasonable and predictable amount of time  Frequency and Duration min 2x/week  2 weeks      SLP Evaluation Cognition  Overall Cognitive Status: History of cognitive impairments - at baseline Arousal/Alertness: Awake/alert Orientation Level: Oriented to person;Disoriented to place;Disoriented to time;Disoriented to situation Attention: Focused Focused Attention: Impaired Focused Attention Impairment: Functional basic       Comprehension  Auditory Comprehension Overall Auditory Comprehension: Impaired Commands: Impaired One Step Basic Commands: 0-24% accurate Conversation: Simple Interfering Components: Attention;Processing speed;Working [employer] fast streamer    Expression Expression Primary Mode of Expression: Verbal Verbal Expression Overall Verbal Expression: Impaired Initiation: Impaired Automatic Speech: Name Level of Generative/Spontaneous Verbalization: Phrase   Oral / Motor  Oral Motor/Sensory Function Overall Oral  Motor/Sensory Function:  (Pt unable to follow commands to complete) Motor Speech Overall Motor Speech: Appears within functional limits for tasks assessed Respiration: Within functional limits Phonation: Low vocal intensity Resonance: Within functional limits Articulation: Within functional limitis Intelligibility: Intelligibility reduced Phrase: 75-100% accurate           Eino Farber, M.S., CCC-SLP Acute Rehabilitation Services Office: [phone]  Shanon Rosser [facility] 11/24/2022, 12:52 PM

## 2022-11-24 NOTE — Evaluation (Signed)
Occupational Therapy Evaluation Patient Details Name: Allison [name] MRN: [id number] DOB: December 08, 1934 Today's Date: 11/24/2022   History of Present Illness 88 y.o. female presents to [facility] on 08/24/2022 with AMS. UA concerning for UTI, pt also with AKI 2/2 dehydration. PMH includes dementia, DMII, hypothyroidism, CVA, HTN, GERD, HLD.   Clinical Impression   Pt lethargic today, required multiple attempts and visits to wake up. Once alert, Pt not able to actively participate or follow commands, not able to make needs known. Pt from SNF, plans to return to SNF. Pt currently total A for all ADLs, was not able to participate in feeding, not able to chew puree food and swallow, refused sips of water. Pt max-total for in/out of bed, able to sit on OOB min A for support with posterior lean. Pt would benefit from continued skilled therapy to maximize participation in feeding/UB ADLs. DC back to SNF appropriate.        If plan is discharge home, recommend the following: Two people to help with walking and/or transfers;Two people to help with bathing/dressing/bathroom;Assistance with cooking/housework;Assistance with feeding;Direct supervision/assist for medications management;Assist for transportation;Help with stairs or ramp for entrance;Direct supervision/assist for financial management    Functional Status Assessment  Patient has had a recent decline in their functional status and/or demonstrates limited ability to make significant improvements in function in a reasonable and predictable amount of time  Equipment Recommendations  None recommended by OT    Recommendations for Other Services       Precautions / Restrictions Precautions Precautions: Fall Restrictions Weight Bearing Restrictions: No      Mobility Bed Mobility Overal bed mobility: Needs Assistance Bed Mobility: Supine to Sit, Sit to Supine     Supine to sit: Max assist, HOB elevated Sit to supine: Total assist    General bed mobility comments: max-total A for assist to EOB and back. able to sit up once sitting with min A support with posterior lean    Transfers Overall transfer level: Needs assistance                 General transfer comment: not able to stand at this time      Balance Overall balance assessment: Needs assistance Sitting-balance support: Bilateral upper extremity supported, Feet supported Sitting balance-Leahy Scale: Poor Sitting balance - Comments: posterior lean at EOB     Standing balance-Leahy Scale: Zero                             ADL either performed or assessed with clinical judgement   ADL Overall ADL's : Needs assistance/impaired                                       General ADL Comments: total A for all ADLs, not able to actively participate.     Vision         Perception         Praxis         Pertinent Vitals/Pain Pain Assessment Pain Assessment: Faces Faces Pain Scale: Hurts a little bit Pain Location: not sure, Pt not able to respond Pain Descriptors / Indicators: Discomfort, Grimacing Pain Intervention(s): Monitored during session     Extremity/Trunk Assessment Upper Extremity Assessment Upper Extremity Assessment: Difficult to assess due to impaired cognition   Lower Extremity Assessment Lower Extremity Assessment: Defer to PT  evaluation       Communication Communication Communication: Difficulty following commands/understanding;Difficulty communicating thoughts/reduced clarity of speech Following commands: Follows one step commands inconsistently   Cognition Arousal: Lethargic Behavior During Therapy: Flat affect Overall Cognitive Status: Impaired/Different from baseline                                 General Comments: Pt not able to respond to questions, relay needs, follow commands, or actively participate. Pt is able to passively participate in therapy     General  Comments       Exercises     Shoulder Instructions      Home Living Family/patient expects to be discharged to:: Skilled nursing facility                                 Additional Comments: history obtained from previous admission as pt unable to provide accurate information      Prior Functioning/Environment Prior Level of Function : Needs assist             Mobility Comments: significant assistance, poor historian ADLs Comments: poor historian, significant assistance        OT Problem List: Decreased strength;Decreased activity tolerance;Impaired balance (sitting and/or standing);Decreased cognition;Decreased safety awareness;Decreased knowledge of use of DME or AE;Pain      OT Treatment/Interventions: Self-care/ADL training;Therapeutic exercise;Energy conservation;DME and/or AE instruction;Therapeutic activities;Patient/family education    OT Goals(Current goals can be found in the care plan section) Acute Rehab OT Goals Patient Stated Goal: unable to participate in goal setting OT Goal Formulation: Patient unable to participate in goal setting Time For Goal Achievement: 12/08/22 Potential to Achieve Goals: Fair  OT Frequency: Min 1X/week    Co-evaluation              AM-PAC OT "6 Clicks" Daily Activity     Outcome Measure Help from another person eating meals?: A Lot Help from another person taking care of personal grooming?: Total Help from another person toileting, which includes using toliet, bedpan, or urinal?: Total Help from another person bathing (including washing, rinsing, drying)?: Total Help from another person to put on and taking off regular upper body clothing?: Total Help from another person to put on and taking off regular lower body clothing?: Total 6 Click Score: 7   End of Session Equipment Utilized During Treatment: Gait belt Nurse Communication: Mobility status  Activity Tolerance: Patient limited by lethargy Patient  left: in bed;with call bell/phone within reach;with bed alarm set  OT Visit Diagnosis: Unsteadiness on feet (R26.81);Other abnormalities of gait and mobility (R26.89);Muscle weakness (generalized) (M62.81);Other symptoms and signs involving cognitive function;Pain                Time: 1130-1155 OT Time Calculation (min): 25 min Charges:  OT General Charges $OT Visit: 1 Visit OT Evaluation $OT Eval Moderate Complexity: 1 Mod OT Treatments $Self Care/Home Management : 8-22 mins  [place], OTR/L   Alexis Goodell 11/24/2022, 3:30 PM

## 2022-11-24 NOTE — Evaluation (Signed)
Clinical/Bedside Swallow Evaluation Patient Details  Name: CELITA HEGGER MRN: [id number] Date of Birth: 1934-08-27  Today's Date: 11/24/2022 Time: SLP Start Time (ACUTE ONLY): 1030 SLP Stop Time (ACUTE ONLY): 1043 SLP Time Calculation (min) (ACUTE ONLY): 13 min  Past Medical History:  Past Medical History:  Diagnosis Date   Acute ischemic stroke (HCC) 10/29/2015   Carpal tunnel syndrome    Dementia (HCC)    Diabetes mellitus without complication (HCC)    Dysphagia    GERD (gastroesophageal reflux disease)    Hiatal hernia    HLD (hyperlipidemia)    Hx of adenomatous colonic polyps 06/19/2014   Hyperlipidemia    Hypertension    Hypothyroidism    Onychomycosis 03/05/2012   Pain in lower limb 04/18/2013   Sciatic nerve pain    Stroke-like symptoms 10/28/2015   Vertigo    Past Surgical History:  Past Surgical History:  Procedure Laterality Date   BLADDER SURGERY     Mesh implant   COLONOSCOPY     LARYNX SURGERY     vocal cords   PARTIAL HYSTERECTOMY  1978   VESICOVAGINAL FISTULA CLOSURE W/ TAH  1976   HPI:  This 88 y.o. female with medical history significant of HTN, HLD, CVA, insulin-dependent Type 2 DM, hypothyroidism, dementia who presents with altered mental status and recent fall. As per report Patient had a fall 5 days ago but was never evaluated. She presented today with increased altered mentation but unclear what is her baseline with history of dementia. She is found to have leukocytosis, hypernatremia with a sodium of 155. Chest x-ray showed slight hazy left lower lobe opacities with concerns for PNA or aspiration. CT head is negative. Patient empirically started on ceftriaxone and Zithromax. Patient is admitted for sepsis likely secondary to pneumonia. Pt has a hx of dysphagia with most recent diet recommendations for Dysphagia 1 solids and thin liquids.    Assessment / Plan / Recommendation  Clinical Impression  Pt was seen for a very limited clinical swallow  evaluation.  She was encountered asleep in bed and only roused briefly to max tactile and verbal stimulation.  Pt was seen with an ice chip x1 and she exhibited reduced labial seal and lingual manipulation with L anterior labial spillage and no attempts at AP transit or swallow initiation.  No further trials were attempted on this date secondary to increased risk of aspiration with pt's significant lethargy.  SLP will f/u to further evaluate when pt is more awake/alert.  Recommend continuation of Dysphagia 1 (puree) and  thin liquids with adherence to aspiration precautions if pt is awake/alert.  SLP Visit Diagnosis: Dysphagia, unspecified (R13.10)    Aspiration Risk  Moderate aspiration risk    Diet Recommendation Dysphagia 1 (Puree);Thin liquid    Liquid Administration via: Cup;Straw Medication Administration: Crushed with puree Supervision: Staff to assist with self feeding;Full supervision/cueing for compensatory strategies Compensations: Minimize environmental distractions;Slow rate;Small sips/bites    Other  Recommendations Oral Care Recommendations: Oral care BID    Recommendations for follow up therapy are one component of a multi-disciplinary discharge planning process, led by the attending physician.  Recommendations may be updated based on patient status, additional functional criteria and insurance authorization.  Follow up Recommendations Long-term institutional care without follow-up therapy      Assistance Recommended at Discharge    Functional Status Assessment Patient has had a recent decline in their functional status and/or demonstrates limited ability to make significant improvements in function in a reasonable and  predictable amount of time  Frequency and Duration min 2x/week  2 weeks       Prognosis Prognosis for improved oropharyngeal function: Fair Barriers to Reach Goals: Cognitive deficits      Swallow Study   General HPI: This 88 y.o. female with medical  history significant of HTN, HLD, CVA, insulin-dependent Type 2 DM, hypothyroidism, dementia who presents with altered mental status and recent fall. As per report Patient had a fall 5 days ago but was never evaluated. She presented today with increased altered mentation but unclear what is her baseline with history of dementia. She is found to have leukocytosis, hypernatremia with a sodium of 155. Chest x-ray showed slight hazy left lower lobe opacities with concerns for PNA or aspiration. CT head is negative. Patient empirically started on ceftriaxone and Zithromax. Patient is admitted for sepsis likely secondary to pneumonia. Pt has a hx of cognitive dysphagia with most recent diet recommendations for Dysphagia 1 solids and thin liquids. Type of Study: Bedside Swallow Evaluation Previous Swallow Assessment: BSE 08/24 Diet Prior to this Study: Thin liquids (Level 0);Dysphagia 1 (pureed) Temperature Spikes Noted: No Respiratory Status: Room air History of Recent Intubation: No Behavior/Cognition: Lethargic/Drowsy Oral Cavity Assessment:  (unable to assess) Oral Care Completed by SLP: No Oral Cavity - Dentition: Edentulous (per limited evaluation) Patient Positioning: Upright in bed Baseline Vocal Quality: Not observed    Oral/Motor/Sensory Function Overall Oral Motor/Sensory Function: Other (comment) (Pt unable to complete secondary to lethargy)   Ice Chips Ice chips: Impaired Presentation: Spoon Oral Phase Impairments: Reduced labial seal;Reduced lingual movement/coordination;Poor awareness of bolus Oral Phase Functional Implications: Left anterior spillage;Oral residue;Oral holding Pharyngeal Phase Impairments: Unable to trigger swallow   Thin Liquid Thin Liquid: Not tested    Nectar Thick Nectar Thick Liquid: Not tested   Honey Thick Honey Thick Liquid: Not tested   Puree Puree: Not tested   Solid     Solid: Not tested      Shanon Rosser Lamorris Knoblock 11/24/2022,10:54 AM

## 2022-11-25 DIAGNOSIS — K5900 Constipation, unspecified: Secondary | ICD-10-CM | POA: Diagnosis not present

## 2022-11-25 DIAGNOSIS — A419 Sepsis, unspecified organism: Secondary | ICD-10-CM | POA: Diagnosis not present

## 2022-11-25 DIAGNOSIS — J189 Pneumonia, unspecified organism: Secondary | ICD-10-CM | POA: Diagnosis not present

## 2022-11-25 DIAGNOSIS — E870 Hyperosmolality and hypernatremia: Secondary | ICD-10-CM | POA: Diagnosis not present

## 2022-11-25 LAB — GLUCOSE, CAPILLARY
Glucose-Capillary: 188 mg/dL — ABNORMAL HIGH (ref 70–99)
Glucose-Capillary: 255 mg/dL — ABNORMAL HIGH (ref 70–99)

## 2022-11-25 MED ORDER — AZITHROMYCIN 500 MG PO TABS: 500.0000 mg | ORAL_TABLET | Freq: Every day | ORAL | 0 refills | Status: DC

## 2022-11-25 MED ORDER — FLUCONAZOLE 100 MG PO TABS: 100.0000 mg | ORAL_TABLET | Freq: Every day | ORAL | 0 refills | Status: AC

## 2022-11-25 NOTE — Progress Notes (Signed)
Yehuda Mao to be discharged Skilled nursing facility [facility] per MD order. Patient verbalized understanding.  Skin clean, dry and intact without evidence of skin break down, no evidence of skin tears noted. IV catheter discontinued intact. Site without signs and symptoms of complications. Dressing and pressure applied. Pt denies pain at the site currently. No complaints noted.  Patient free of lines, drains, and wounds.   Discharge packet assembled. An After Visit Summary (AVS) was printed and given to the EMS personnel. Patient escorted via stretcher and discharged to [facility] via ambulance. Report called to accepting facility [facility]); all questions and concerns addressed.   Arvilla Meres, RN

## 2022-11-25 NOTE — NC FL2 (Addendum)
[place] MEDICAID FL2 LEVEL OF CARE FORM     IDENTIFICATION  Patient Name: Allison [name] Birthdate: 23-Jul-1934 Sex: female Admission Date (Current Location): 11/21/2022  [facility] and [place] Number:  Producer, television/film/video and Address:  [facility], [address] [place], [place] [address]      Provider Number: [id number]  Attending Physician Name and Address:  Azucena Fallen, MD  Relative Name and Phone Number:       Current Level of Care: Hospital Recommended Level of Care: Skilled Nursing Facility Prior Approval Number:    Date Approved/Denied:   PASRR Number:    Discharge Plan: SNF    Current Diagnoses: Patient Active Problem List   Diagnosis Date Noted   Sepsis (HCC) 11/22/2022   Constipation 11/22/2022   Malnutrition of moderate degree 08/27/2022   AKI (acute kidney injury) (HCC) 08/24/2022   Sepsis secondary to UTI (HCC) 08/24/2022   Acute metabolic encephalopathy 08/24/2022   Diabetic ketoacidosis (HCC) 08/16/2022   DKA, type 2 (HCC) 08/15/2022   Alzheimer's dementia (HCC) 08/15/2022   Hypernatremia 08/15/2022   CAP (community acquired pneumonia) 01/07/2021   Family discord 03/28/2019   Neurocognitive deficits 01/04/2018   History of stroke 10/01/2016   Anxiety state 06/03/2016   Visual hallucinations 10/28/2015   CVA (cerebral vascular accident) (HCC) 10/28/2015   Ischemic stroke (HCC)    Hx of adenomatous colonic polyps 06/19/2014   Neuropathic pain 03/27/2014   Uncontrolled type 2 diabetes mellitus with hyperglycemia, without long-term current use of insulin (HCC) 03/27/2014   Bilateral carpal tunnel syndrome 03/27/2014   Dehydration 08/05/2013   Diabetes mellitus with hyperglycemia (HCC) 08/05/2013   Hypercalcemia 08/05/2013   PVD (peripheral vascular disease) (HCC) 03/05/2012   Onychomycosis 03/05/2012   Other hammer toe (acquired) 03/05/2012   Abnormal CXR 12/25/2011   Cystocele 06/11/2011   Incontinence  06/04/2011   Depression 06/04/2011   Hypothyroidism 06/23/2006   Dyslipidemia 06/23/2006   Essential hypertension 06/23/2006   GERD 06/23/2006   IBS 06/23/2006    Orientation RESPIRATION BLADDER Height & Weight     Self  Normal Incontinent Weight: 125 lb 10.6 oz (57 kg) Height:  5\' 4"  (162.6 cm)  BEHAVIORAL SYMPTOMS/MOOD NEUROLOGICAL BOWEL NUTRITION STATUS      Incontinent Diet (DSY 1)  AMBULATORY STATUS COMMUNICATION OF NEEDS Skin   Extensive Assist Verbally Normal                       Personal Care Assistance Level of Assistance  Bathing, Feeding, Dressing Bathing Assistance: Maximum assistance Feeding assistance: Maximum assistance Dressing Assistance: Maximum assistance     Functional Limitations Info             SPECIAL CARE FACTORS FREQUENCY                       Contractures      Additional Factors Info  Code Status, Allergies Code Status Info: Full Allergies Info: Lantus (Insulin Glargine), Levemir (Insulin Detemir), Pineapple, Codeine, Invokana (Canagliflozin), Penicillins, Sulfa Antibiotics, Tradjenta (Linagliptin), Vioxx (Rofecoxib), Exelon (Rivastigmine)           Current Medications (11/25/2022):  This is the current hospital active medication list Current Facility-Administered Medications  Medication Dose Route Frequency Provider Last Rate Last Admin   azithromycin (ZITHROMAX) tablet 500 mg  500 mg Oral QHS Idelle Leech, Pardeep, MD   500 mg at 11/24/22 2200   cefTRIAXone (ROCEPHIN) 1 g in sodium chloride  0.9 % 100 mL IVPB  1 g Intravenous Q24H Khatri, Pardeep, MD 200 mL/hr at 11/24/22 2200 1 g at 11/24/22 2200   enoxaparin (LOVENOX) injection 30 mg  30 mg Subcutaneous Q24H Tu, Ching T, DO   30 mg at 11/25/22 5284   feeding supplement (GLUCERNA SHAKE) (GLUCERNA SHAKE) liquid 237 mL  237 mL Oral TID BM Khatri, Pardeep, MD   237 mL at 11/25/22 0925   fluconazole (DIFLUCAN) tablet 100 mg  100 mg Oral Daily Willeen Niece, MD   100 mg at  11/25/22 1324   insulin aspart (novoLOG) injection 0-15 Units  0-15 Units Subcutaneous TID WC Tu, Ching T, DO   3 Units at 11/25/22 0900   polyethylene glycol (MIRALAX / GLYCOLAX) packet 17 g  17 g Oral Daily Tu, Ching T, DO   17 g at 11/25/22 4010     Discharge Medications: Please see discharge summary for a list of discharge medications.  Relevant Imaging Results:  Relevant Lab Results:   Additional Information SSN: [id number]  Antion Felipa Emory, Student-Social Work

## 2022-11-25 NOTE — Discharge Summary (Signed)
Physician Discharge Summary  BEDA RABUCK WUJ:[id number] DOB: 19-Feb-1934 DOA: 11/21/2022  PCP: [facility]  Admit date: 11/21/2022 Discharge date: 11/25/2022  Admitted From: SNF Disposition: Same  Recommendations for Outpatient Follow-up:  Follow up with PCP in 1-2 weeks  Discharge Condition: Stable CODE STATUS: Full Diet recommendation: As tolerated  Brief/Interim Summary: 88 y.o. female with medical history significant of HTN, HLD, CVA, insulin-dependent Type 2 DM, hypothyroidism, dementia who presents with altered mental status and recent fall. As per report Patient had a fall 5 days ago but was never evaluated. She presented today with increased altered mentation but unclear what is her baseline with history of dementia. She is found to have leukocytosis, hypernatremia with a sodium of 155. Chest x-ray showed slight hazy left lower lobe opacities. CT head is negative. Patient empirically started on ceftriaxone and Zithromax. Patient is admitted for sepsis likely secondary to pneumonia.   Patient improving quite drastically on antibiotics, last dose today/tomorrow of azithromycin versus fluconazole respectively.  Patient otherwise appears to be back to baseline, demented but oriented to person and general situation.  She is otherwise stable and agreeable for discharge home.  Close follow-up with PCP in the next 1 to 2 weeks as scheduled.  Changes listed below including discontinuation of amlodipine given borderline hypotension during hospitalization.   Discharge Diagnoses:  Principal Problem:   Sepsis (HCC) Active Problems:   DKA, type 2 (HCC)   Hypernatremia   Alzheimer's dementia (HCC)   Essential hypertension   CAP (community acquired pneumonia)   Constipation    Discharge Instructions  Discharge Instructions     Discharge patient   Complete by: As directed    Discharge disposition: 03-Skilled Nursing Facility   Discharge patient  date: 11/25/2022      Allergies as of 11/25/2022       Reactions   Lantus [insulin Glargine] Itching, Swelling   angioedema   Levemir [insulin Detemir] Itching, Swelling   angioedema   Pineapple Anaphylaxis   Not listed on the [facility]   Codeine Other (See Comments)   Not listed on the [facility]   Invokana [canagliflozin] Other (See Comments)   Constipation, nightmares Not listed on the [facility]   Penicillins Other (See Comments)   Unknown reaction per MAR   Sulfa Antibiotics Other (See Comments)   Unknown reaction per MAR **Sulfasalazine included on MAR**   Tradjenta [linagliptin] Other (See Comments)   Not listed on the [facility]   Vioxx [rofecoxib] Other (See Comments)   Not listed on the [facility]   Exelon [rivastigmine] Rash        Medication List     STOP taking these medications    amLODipine 10 MG tablet Commonly known as: NORVASC       TAKE these medications    acetaminophen 500 MG tablet Commonly known as: TYLENOL Take 500 mg by mouth daily as needed for mild pain.   aspirin EC 81 MG tablet Take 1 tablet (81 mg total) by mouth daily. Swallow whole.   atorvastatin 40 MG tablet Commonly known as: LIPITOR Take 1 tablet (40 mg total) by mouth daily.   azithromycin 500 MG tablet Commonly known as: ZITHROMAX Take 1 tablet (500 mg total) by mouth at bedtime.   bisacodyl 10 MG suppository Commonly known as: DULCOLAX Place 10 mg rectally daily as needed for moderate constipation.   Blood Glucose Monitoring Suppl Devi 1 each by Does not apply route in the morning, at noon, and at bedtime.  May substitute to any manufacturer covered by patient's insurance.   calcium carbonate 750 MG chewable tablet Commonly known as: TUMS EX Chew 2 tablets by mouth daily as needed (indigestion).   fluconazole 100 MG tablet Commonly known as: DIFLUCAN Take 1 tablet (100 mg total) by mouth daily for 1 day.   insulin aspart protamine - aspart (70-30) 100 UNIT/ML FlexPen Commonly known as:  NOVOLOG 70/30 MIX Inject 10 Units into the skin 2 (two) times daily with a meal.   levothyroxine 125 MCG tablet Commonly known as: SYNTHROID Take 1 tablet (125 mcg total) by mouth daily before breakfast.   magnesium hydroxide 400 MG/5ML suspension Commonly known as: MILK OF MAGNESIA Take 30 mLs by mouth daily as needed for mild constipation.   melatonin 5 MG Tabs Take 5 mg by mouth at bedtime.   mirtazapine 7.5 MG tablet Commonly known as: REMERON Take 7.5 mg by mouth at bedtime.   pantoprazole 20 MG tablet Commonly known as: PROTONIX Take 1 tablet (20 mg total) by mouth daily.   Pen Needles 3/16" 31G X 5 MM Misc 1 Pen by Does not apply route in the morning and at bedtime.   Voltaren Arthritis Pain 1 % Gel Generic drug: diclofenac Sodium Apply 2 g topically daily as needed (for pain).        Contact information for after-discharge care     Destination     [facility] Preferred SNF .   Service: Skilled Nursing Contact information: [address] [place] [address] [phone]                    Allergies  Allergen Reactions   Lantus [Insulin Glargine] Itching and Swelling    angioedema   Levemir [Insulin Detemir] Itching and Swelling    angioedema   Pineapple Anaphylaxis    Not listed on the [facility]   Codeine Other (See Comments)    Not listed on the [facility]   Invokana [Canagliflozin] Other (See Comments)    Constipation, nightmares Not listed on the [facility]   Penicillins Other (See Comments)    Unknown reaction per MAR    Sulfa Antibiotics Other (See Comments)    Unknown reaction per MAR **Sulfasalazine included on MAR**   Tradjenta [Linagliptin] Other (See Comments)    Not listed on the [facility]   Vioxx [Rofecoxib] Other (See Comments)    Not listed on the [facility]   Exelon [Rivastigmine] Rash    Consultations: None  Procedures/Studies: CT ABDOMEN PELVIS W CONTRAST  Result Date: 11/22/2022 CLINICAL DATA:  Left  lower quadrant abdominal pain. EXAM: CT ABDOMEN AND PELVIS WITH CONTRAST TECHNIQUE: Multidetector CT imaging of the abdomen and pelvis was performed using the standard protocol following bolus administration of intravenous contrast. RADIATION DOSE REDUCTION: This exam was performed according to the departmental dose-optimization program which includes automated exposure control, adjustment of the mA and/or kV according to patient size and/or use of iterative reconstruction technique. CONTRAST:  60mL OMNIPAQUE IOHEXOL 350 MG/ML SOLN COMPARISON:  08/25/2022. FINDINGS: Lower chest: Dependent atelectasis is noted at the lung bases. Multiple pulmonary nodules are seen bilaterally measuring up to 7 mm in the left lower lobe, axial image 11. Hepatobiliary: No focal liver abnormality is seen. No gallstones, gallbladder wall thickening, or biliary dilatation. Pancreas: Unremarkable. No pancreatic ductal dilatation or surrounding inflammatory changes. Spleen: Normal in size without focal abnormality. Adrenals/Urinary Tract: The adrenal glands are within normal limits. The kidneys enhance symmetrically. Cysts are present in  the kidneys bilaterally. No renal calculus or hydronephrosis is seen. The bladder is unremarkable. Stomach/Bowel: Stomach is within normal limits. Appendix is not seen. No evidence of bowel wall thickening, distention, or inflammatory changes. No free air or pneumatosis. Few scattered diverticula are present along the colon without evidence of diverticulitis. A moderate amount of retained stool is present in the colon and rectum. Vascular/Lymphatic: Aortic atherosclerosis. No enlarged abdominal or pelvic lymph nodes. Reproductive: Status post hysterectomy. No adnexal masses. Other: No abdominopelvic ascites. Musculoskeletal: Degenerative changes are present in the thoracolumbar spine. No acute osseous abnormality is seen. IMPRESSION: 1. No acute intra-abdominal process. 2. Moderate amount of retained stool  in the colon and rectum suggesting constipation. 3. Diverticulosis without diverticulitis. 4. Stable scattered pulmonary nodules at the lung bases measuring up to 7 mm, unchanged from 2019 and likely benign. 5. Aortic atherosclerosis. Electronically Signed   By: Thornell Sartorius M.D.   On: 11/22/2022 00:27   DG Hip Unilat W or Wo Pelvis 2-3 Views Right  Result Date: 11/22/2022 CLINICAL DATA:  Fall EXAM: DG HIP (WITH OR WITHOUT PELVIS) 2-3V RIGHT COMPARISON:  None Available. FINDINGS: There is no evidence of hip fracture or dislocation. Degenerative changes pubic symphysis, both hips, SI joints and lower lumbar spine. IMPRESSION: No acute fracture or dislocation. Electronically Signed   By: Minerva Fester M.D.   On: 11/22/2022 00:02   CT Head Wo Contrast  Result Date: 11/21/2022 CLINICAL DATA:  Head trauma, altered mental status. Fall from bed 5 days ago. EXAM: CT HEAD WITHOUT CONTRAST TECHNIQUE: Contiguous axial images were obtained from the base of the skull through the vertex without intravenous contrast. RADIATION DOSE REDUCTION: This exam was performed according to the departmental dose-optimization program which includes automated exposure control, adjustment of the mA and/or kV according to patient size and/or use of iterative reconstruction technique. COMPARISON:  02/29/2023. FINDINGS: Brain: No acute intracranial hemorrhage, midline shift or mass effect. No extra-axial fluid collection. Diffuse atrophy is noted. Periventricular white matter hypodensities are present bilaterally. No hydrocephalus. Hypodensity is noted in the lentiform nucleus on the left suggesting old infarct. Vascular: No hyperdense vessel or unexpected calcification. Skull: Normal. Negative for fracture or focal lesion. Sinuses/Orbits: No acute finding. Other: None. IMPRESSION: 1. No acute intracranial process. 2. Atrophy with chronic microvascular ischemic changes and old infarct. Electronically Signed   By: Thornell Sartorius M.D.    On: 11/21/2022 21:22   DG Chest Portable 1 View  Result Date: 11/21/2022 CLINICAL DATA:  Altered mental status EXAM: PORTABLE CHEST 1 VIEW COMPARISON:  Radiograph 08/24/2022 and CT chest 08/25/2022 FINDINGS: Airspace opacities in the left lower lung. The lungs are otherwise clear. Stable cardiomediastinal silhouette. Aortic atherosclerotic calcification. IMPRESSION: Airspace opacities in the left lower lung may represent atelectasis, pneumonia, or aspiration. Electronically Signed   By: Minerva Fester M.D.   On: 11/21/2022 21:12     Subjective: No acute issues or events overnight   Discharge Exam: Vitals:   11/25/22 0524 11/25/22 0948  BP: (!) 168/76 (!) 156/66  Pulse: 83 89  Resp: 16 20  Temp: 97.7 F (36.5 C) (!) 97.4 F (36.3 C)  SpO2: 96% 99%   Vitals:   11/24/22 1622 11/24/22 2043 11/25/22 0524 11/25/22 0948  BP: (!) 155/76 (!) 153/73 (!) 168/76 (!) 156/66  Pulse: 92 97 83 89  Resp: 18 18 16 20   Temp: 98 F (36.7 C) 97.6 F (36.4 C) 97.7 F (36.5 C) (!) 97.4 F (36.3 C)  TempSrc:  Oral Oral  SpO2: 99%  96% 99%  Weight:      Height:        General: Pt is alert, awake, not in acute distress Cardiovascular: RRR, S1/S2 +, no rubs, no gallops Respiratory: CTA bilaterally, no wheezing, no rhonchi Abdominal: Soft, NT, ND, bowel sounds + Extremities: no edema, no cyanosis    The results of significant diagnostics from this hospitalization (including imaging, microbiology, ancillary and laboratory) are listed below for reference.     Microbiology: Recent Results (from the past 240 hour(s))  Resp panel by RT-PCR (RSV, Flu A&B, Covid) Anterior Nasal Swab     Status: None   Collection Time: 11/21/22  7:55 PM   Specimen: Anterior Nasal Swab  Result Value Ref Range Status   SARS Coronavirus 2 by RT PCR NEGATIVE NEGATIVE Final   Influenza A by PCR NEGATIVE NEGATIVE Final   Influenza B by PCR NEGATIVE NEGATIVE Final    Comment: (NOTE) The Xpert Xpress  SARS-CoV-2/FLU/RSV plus assay is intended as an aid in the diagnosis of influenza from Nasopharyngeal swab specimens and should not be used as a sole basis for treatment. Nasal washings and aspirates are unacceptable for Xpert Xpress SARS-CoV-2/FLU/RSV testing.  Fact Sheet for Patients: BloggerCourse.com  Fact Sheet for Healthcare Providers: SeriousBroker.it  This test is not yet approved or cleared by the [place] FDA and has been authorized for detection and/or diagnosis of SARS-CoV-2 by FDA under an Emergency Use Authorization (EUA). This EUA will remain in effect (meaning this test can be used) for the duration of the COVID-19 declaration under Section 564(b)(1) of the Act, 21 U.S.C. section 360bbb-3(b)(1), unless the authorization is terminated or revoked.     Resp Syncytial Virus by PCR NEGATIVE NEGATIVE Final    Comment: (NOTE) Fact Sheet for Patients: BloggerCourse.com  Fact Sheet for Healthcare Providers: SeriousBroker.it  This test is not yet approved or cleared by the [place] FDA and has been authorized for detection and/or diagnosis of SARS-CoV-2 by FDA under an Emergency Use Authorization (EUA). This EUA will remain in effect (meaning this test can be used) for the duration of the COVID-19 declaration under Section 564(b)(1) of the Act, 21 U.S.C. section 360bbb-3(b)(1), unless the authorization is terminated or revoked.  Performed at [facility], [address], [place], [place] [address]   Blood culture (routine x 2)     Status: None (Preliminary result)   Collection Time: 11/21/22  9:06 PM   Specimen: BLOOD  Result Value Ref Range Status   Specimen Description BLOOD RIGHT ANTECUBITAL  Final   Special Requests   Final    BOTTLES DRAWN AEROBIC AND ANAEROBIC Blood Culture adequate volume   Culture   Final    NO GROWTH 4 DAYS Performed at  [facility], [address], [place], [place] [address]    Report Status PENDING  Incomplete  Blood culture (routine x 2)     Status: None (Preliminary result)   Collection Time: 11/21/22  9:09 PM   Specimen: BLOOD  Result Value Ref Range Status   Specimen Description BLOOD LEFT ANTECUBITAL  Final   Special Requests   Final    BOTTLES DRAWN AEROBIC AND ANAEROBIC Blood Culture results may not be optimal due to an inadequate volume of blood received in culture bottles   Culture   Final    NO GROWTH 4 DAYS Performed at [facility], [address] [place], [place] [address]    Report Status PENDING  Incomplete  Urine Culture     Status: Abnormal   Collection Time: 11/22/22 12:02 AM   Specimen: Urine, Random  Result Value Ref Range Status   Specimen Description URINE, RANDOM  Final   Special Requests   Final    NONE Reflexed from [phone] [phone] Performed at [facility], [address] [place], [place] [address]    Culture >=100,000 COLONIES/mL YEAST (A)  Final   Report Status 11/23/2022 FINAL  Final     Labs: BNP (last 3 results) No results for input(s): "BNP" in the last 8760 hours. Basic Metabolic Panel: Recent Labs  Lab 11/21/22 2109 11/22/22 0409 11/24/22 0409  NA 155* 155* 151*  K 4.2 4.0 4.6  CL 121* 120* 119*  CO2 23 23 23   GLUCOSE 121* 194* 197*  BUN 36* 32* 18  CREATININE 1.32* 1.29* 1.04*  CALCIUM 10.3 9.7 9.3  MG  --   --  2.0  PHOS  --   --  3.0   Liver Function Tests: Recent Labs  Lab 11/21/22 2109  AST 20  ALT 14  ALKPHOS 68  BILITOT 0.6  PROT 6.7  ALBUMIN 3.1*   No results for input(s): "LIPASE", "AMYLASE" in the last 168 hours. Recent Labs  Lab 11/21/22 2109  AMMONIA 16   CBC: Recent Labs  Lab 11/21/22 2109 11/22/22 0409 11/24/22 0857  WBC 12.1* 11.2* 8.6  NEUTROABS 7.5  --   --   HGB 12.0 10.8* 12.3  HCT 39.1 35.0* 37.8  MCV 85.0 84.5 82.9  PLT 282 246 201   Cardiac Enzymes: No results for input(s): "CKTOTAL",  "CKMB", "CKMBINDEX", "TROPONINI" in the last 168 hours. BNP: Invalid input(s): "POCBNP" CBG: Recent Labs  Lab 11/24/22 1137 11/24/22 1621 11/24/22 2150 11/25/22 0735 11/25/22 1202  GLUCAP 240* 255* 208* 188* 255*   D-Dimer No results for input(s): "DDIMER" in the last 72 hours. Hgb A1c No results for input(s): "HGBA1C" in the last 72 hours. Lipid Profile No results for input(s): "CHOL", "HDL", "LDLCALC", "TRIG", "CHOLHDL", "LDLDIRECT" in the last 72 hours. Thyroid function studies No results for input(s): "TSH", "T4TOTAL", "T3FREE", "THYROIDAB" in the last 72 hours.  Invalid input(s): "FREET3" Anemia work up No results for input(s): "VITAMINB12", "FOLATE", "FERRITIN", "TIBC", "IRON", "RETICCTPCT" in the last 72 hours. Urinalysis    Component Value Date/Time   COLORURINE YELLOW (A) 11/22/2022 0002   APPEARANCEUR TURBID (A) 11/22/2022 0002   LABSPEC 1.014 11/22/2022 0002   PHURINE 5.0 11/22/2022 0002   GLUCOSEU NEGATIVE 11/22/2022 0002   HGBUR SMALL (A) 11/22/2022 0002   BILIRUBINUR NEGATIVE 11/22/2022 0002   KETONESUR NEGATIVE 11/22/2022 0002   PROTEINUR 100 (A) 11/22/2022 0002   UROBILINOGEN 0.2 03/29/2014 1055   NITRITE NEGATIVE 11/22/2022 0002   LEUKOCYTESUR MODERATE (A) 11/22/2022 0002   Sepsis Labs Recent Labs  Lab 11/21/22 2109 11/22/22 0409 11/24/22 0857  WBC 12.1* 11.2* 8.6   Microbiology Recent Results (from the past 240 hour(s))  Resp panel by RT-PCR (RSV, Flu A&B, Covid) Anterior Nasal Swab     Status: None   Collection Time: 11/21/22  7:55 PM   Specimen: Anterior Nasal Swab  Result Value Ref Range Status   SARS Coronavirus 2 by RT PCR NEGATIVE NEGATIVE Final   Influenza A by PCR NEGATIVE NEGATIVE Final   Influenza B by PCR NEGATIVE NEGATIVE Final    Comment: (NOTE) The Xpert Xpress SARS-CoV-2/FLU/RSV plus assay is intended as an aid in the diagnosis of influenza from Nasopharyngeal swab specimens and should  not be used as a sole basis for  treatment. Nasal washings and aspirates are unacceptable for Xpert Xpress SARS-CoV-2/FLU/RSV testing.  Fact Sheet for Patients: BloggerCourse.com  Fact Sheet for Healthcare Providers: SeriousBroker.it  This test is not yet approved or cleared by the [place] FDA and has been authorized for detection and/or diagnosis of SARS-CoV-2 by FDA under an Emergency Use Authorization (EUA). This EUA will remain in effect (meaning this test can be used) for the duration of the COVID-19 declaration under Section 564(b)(1) of the Act, 21 U.S.C. section 360bbb-3(b)(1), unless the authorization is terminated or revoked.     Resp Syncytial Virus by PCR NEGATIVE NEGATIVE Final    Comment: (NOTE) Fact Sheet for Patients: BloggerCourse.com  Fact Sheet for Healthcare Providers: SeriousBroker.it  This test is not yet approved or cleared by the [place] FDA and has been authorized for detection and/or diagnosis of SARS-CoV-2 by FDA under an Emergency Use Authorization (EUA). This EUA will remain in effect (meaning this test can be used) for the duration of the COVID-19 declaration under Section 564(b)(1) of the Act, 21 U.S.C. section 360bbb-3(b)(1), unless the authorization is terminated or revoked.  Performed at [facility], [address], [place], [place] [address]   Blood culture (routine x 2)     Status: None (Preliminary result)   Collection Time: 11/21/22  9:06 PM   Specimen: BLOOD  Result Value Ref Range Status   Specimen Description BLOOD RIGHT ANTECUBITAL  Final   Special Requests   Final    BOTTLES DRAWN AEROBIC AND ANAEROBIC Blood Culture adequate volume   Culture   Final    NO GROWTH 4 DAYS Performed at [facility], [address] [place], [place] [address]    Report Status PENDING  Incomplete  Blood culture (routine x 2)     Status: None (Preliminary  result)   Collection Time: 11/21/22  9:09 PM   Specimen: BLOOD  Result Value Ref Range Status   Specimen Description BLOOD LEFT ANTECUBITAL  Final   Special Requests   Final    BOTTLES DRAWN AEROBIC AND ANAEROBIC Blood Culture results may not be optimal due to an inadequate volume of blood received in culture bottles   Culture   Final    NO GROWTH 4 DAYS Performed at [facility], [address], [place], [place] [address]    Report Status PENDING  Incomplete  Urine Culture     Status: Abnormal   Collection Time: 11/22/22 12:02 AM   Specimen: Urine, Random  Result Value Ref Range Status   Specimen Description URINE, RANDOM  Final   Special Requests   Final    NONE Reflexed from [phone] [phone] Performed at [facility], [address], [place], [place] [address]    Culture >=100,000 COLONIES/mL YEAST (A)  Final   Report Status 11/23/2022 FINAL  Final     Time coordinating discharge: Over 30 minutes  SIGNED:   Azucena Fallen, DO [facility] 11/25/2022, 12:05 PM Pager   If 7PM-7AM, please contact night-coverage [email or web address]

## 2022-11-25 NOTE — Progress Notes (Signed)
Tried to call pt;s son Tinnie Gens but was going straight to voice mail which was full.

## 2022-11-25 NOTE — Progress Notes (Signed)
PT Cancellation Note  Patient Details Name: Allison [name] MRN: [id number] DOB: 1934-06-28   Cancelled Treatment:    Reason Eval/Treat Not Completed: PT screened, no needs identified, will sign off. As noted 2 days ago pt is total care and is long term care pt at facility. No PT needs.   Angelina Ok [facility] 11/25/2022, 12:58 PM Skip Mayer PT Acute [employer] [phone]

## 2022-11-25 NOTE — Care Management Important Message (Signed)
Important Message  Patient Details  Name: Allison [name] MRN: [id number] Date of Birth: 02-Mar-1934   Important Message Given:  Yes - Medicare IM  Patient was sleep IM was giving to Case Manager  to provide copy to the patient     Dorena Bodo 11/25/2022, 3:28 PM

## 2022-11-25 NOTE — TOC Transition Note (Signed)
Transition of Care [facility]) - CM/SW Discharge Note   Patient Details  Name: Allison [name] MRN: [id number] Date of Birth: 06-12-1934  [facility] CM/SW Contact:  Lindsea Olivar Felipa [place], Student-Social Work Phone Number: 11/25/2022, 1:49 PM   Clinical Narrative:   MSW Student updated [facility] about patient returning to facility today, they are in agreement. [facility] approved patient for stretcher transport. MSW Student confirmed with MD that patient is stable for discharge. MSW Student notified son [name] and they are in agreement with discharge. MSW Student confirmed bed available at [facility] . Transport arranged with [facility] for next available.  Number to call report: [phone] RM: 125A    Final next level of care: Skilled Nursing Facility Barriers to Discharge: Barriers Resolved   Patient Goals and CMS Choice      Discharge Placement                Patient chooses bed at:  [facility]) Patient to be transferred to facility by: [facility] Name of family member notified: [name] Patient and family notified of of transfer: 11/25/22  Discharge Plan and Services Additional resources added to the After Visit Summary for   In-house Referral: Clinical Social Work   Post Acute Care Choice: Skilled Nursing Facility                               Social Determinants of Health (SDOH) Interventions SDOH Screenings   Food Insecurity: No Food Insecurity (11/22/2022)  Housing: Low Risk  (11/22/2022)  Transportation Needs: No Transportation Needs (11/22/2022)  Utilities: Not At Risk (11/22/2022)  Alcohol Screen: Low Risk  (10/11/2017)  Financial Resource Strain: Low Risk  (06/28/2017)  Physical Activity: Insufficiently Active (06/28/2017)  Social Connections: Somewhat Isolated (06/28/2017)  Stress: Stress Concern Present (06/28/2017)  Tobacco Use: Low Risk  (11/21/2022)     Readmission Risk Interventions    08/18/2022   12:38 PM  Readmission Risk Prevention Plan   Transportation Screening Complete  PCP or Specialist Appt within 5-7 Days Complete  Home Care Screening Complete  Medication Review (RN CM) Complete

## 2022-11-26 LAB — CULTURE, BLOOD (ROUTINE X 2)
Culture: NO GROWTH
Culture: NO GROWTH
Special Requests: ADEQUATE

## 2022-12-03 ENCOUNTER — Other Ambulatory Visit: Payer: Self-pay | Admitting: Student

## 2022-12-03 DIAGNOSIS — N939 Abnormal uterine and vaginal bleeding, unspecified: Secondary | ICD-10-CM

## 2022-12-08 ENCOUNTER — Ambulatory Visit
Admission: RE | Admit: 2022-12-08 | Discharge: 2022-12-08 | Disposition: A | Discharge status: Home / Self Care | Payer: Medicare (Managed Care) | Source: Ambulatory Visit | Attending: Student

## 2022-12-08 DIAGNOSIS — N939 Abnormal uterine and vaginal bleeding, unspecified: Secondary | ICD-10-CM

## 2023-01-31 ENCOUNTER — Other Ambulatory Visit: Payer: Self-pay

## 2023-01-31 ENCOUNTER — Emergency Department (HOSPITAL_COMMUNITY)
Admission: EM | Admit: 2023-01-31 | Discharge: 2023-01-31 | Disposition: A | Discharge status: Skilled Nursing Facility | Payer: Medicare (Managed Care) | Attending: Emergency Medicine | Admitting: Emergency Medicine

## 2023-01-31 ENCOUNTER — Encounter (HOSPITAL_COMMUNITY): Payer: Self-pay

## 2023-01-31 DIAGNOSIS — I10 Essential (primary) hypertension: Secondary | ICD-10-CM | POA: Diagnosis not present

## 2023-01-31 DIAGNOSIS — Z7982 Long term (current) use of aspirin: Secondary | ICD-10-CM | POA: Insufficient documentation

## 2023-01-31 DIAGNOSIS — Z79899 Other long term (current) drug therapy: Secondary | ICD-10-CM | POA: Insufficient documentation

## 2023-01-31 DIAGNOSIS — E1165 Type 2 diabetes mellitus with hyperglycemia: Secondary | ICD-10-CM | POA: Insufficient documentation

## 2023-01-31 DIAGNOSIS — E039 Hypothyroidism, unspecified: Secondary | ICD-10-CM | POA: Diagnosis not present

## 2023-01-31 DIAGNOSIS — R739 Hyperglycemia, unspecified: Secondary | ICD-10-CM

## 2023-01-31 DIAGNOSIS — Z794 Long term (current) use of insulin: Secondary | ICD-10-CM | POA: Insufficient documentation

## 2023-01-31 DIAGNOSIS — F0390 Unspecified dementia without behavioral disturbance: Secondary | ICD-10-CM | POA: Diagnosis not present

## 2023-01-31 LAB — CBC WITH DIFFERENTIAL/PLATELET
Abs Immature Granulocytes: 0.03 10*3/uL (ref 0.00–0.07)
Basophils Absolute: 0.1 10*3/uL (ref 0.0–0.1)
Basophils Relative: 1 %
Eosinophils Absolute: 0.4 10*3/uL (ref 0.0–0.5)
Eosinophils Relative: 5 %
HCT: 29 % — ABNORMAL LOW (ref 36.0–46.0)
Hemoglobin: 9.4 g/dL — ABNORMAL LOW (ref 12.0–15.0)
Immature Granulocytes: 0 %
Lymphocytes Relative: 29 %
Lymphs Abs: 2.5 10*3/uL (ref 0.7–4.0)
MCH: 24.7 pg — ABNORMAL LOW (ref 26.0–34.0)
MCHC: 32.4 g/dL (ref 30.0–36.0)
MCV: 76.3 fL — ABNORMAL LOW (ref 80.0–100.0)
Monocytes Absolute: 1 10*3/uL (ref 0.1–1.0)
Monocytes Relative: 11 %
Neutro Abs: 4.6 10*3/uL (ref 1.7–7.7)
Neutrophils Relative %: 54 %
Platelets: 348 10*3/uL (ref 150–400)
RBC: 3.8 MIL/uL — ABNORMAL LOW (ref 3.87–5.11)
RDW: 14.9 % (ref 11.5–15.5)
WBC: 8.6 10*3/uL (ref 4.0–10.5)
nRBC: 0 % (ref 0.0–0.2)

## 2023-01-31 LAB — BASIC METABOLIC PANEL
Anion gap: 10 (ref 5–15)
BUN: 14 mg/dL (ref 8–23)
CO2: 25 mmol/L (ref 22–32)
Calcium: 9.5 mg/dL (ref 8.9–10.3)
Chloride: 102 mmol/L (ref 98–111)
Creatinine, Ser: 0.93 mg/dL (ref 0.44–1.00)
GFR, Estimated: 60 mL/min — ABNORMAL LOW (ref >60)
Glucose, Bld: 264 mg/dL — ABNORMAL HIGH (ref 70–99)
Potassium: 4.1 mmol/L (ref 3.5–5.1)
Sodium: 137 mmol/L (ref 135–145)

## 2023-01-31 LAB — I-STAT VENOUS BLOOD GAS, ED
Acid-Base Excess: 6 mmol/L — ABNORMAL HIGH (ref 0.0–2.0)
Bicarbonate: 30.1 mmol/L — ABNORMAL HIGH (ref 20.0–28.0)
Calcium, Ion: 1.2 mmol/L (ref 1.15–1.40)
HCT: 30 % — ABNORMAL LOW (ref 36.0–46.0)
Hemoglobin: 10.2 g/dL — ABNORMAL LOW (ref 12.0–15.0)
O2 Saturation: 87 %
Potassium: 4.4 mmol/L (ref 3.5–5.1)
Sodium: 139 mmol/L (ref 135–145)
TCO2: 31 mmol/L (ref 22–32)
pCO2, Ven: 40.2 mm[Hg] — ABNORMAL LOW (ref 44–60)
pH, Ven: 7.483 — ABNORMAL HIGH (ref 7.25–7.43)
pO2, Ven: 50 mm[Hg] — ABNORMAL HIGH (ref 32–45)

## 2023-01-31 LAB — CBG MONITORING, ED
Glucose-Capillary: 299 mg/dL — ABNORMAL HIGH (ref 70–99)
Glucose-Capillary: 162 mg/dL — ABNORMAL HIGH (ref 70–99)

## 2023-01-31 LAB — BETA-HYDROXYBUTYRIC ACID: Beta-Hydroxybutyric Acid: 0.13 mmol/L (ref 0.05–0.27)

## 2023-01-31 MED ORDER — MELATONIN 5 MG PO TABS
5.0000 mg | ORAL_TABLET | ORAL | Status: DC
  Filled 2023-01-31: qty 1

## 2023-01-31 MED ORDER — LACTATED RINGERS IV BOLUS: 500.0000 mL | Freq: Once | INTRAVENOUS | Status: DC

## 2023-01-31 MED ORDER — ONDANSETRON HCL 4 MG/2ML IJ SOLN: 4.0000 mg | Freq: Four times a day (QID) | INTRAMUSCULAR | Status: DC | PRN

## 2023-01-31 MED ORDER — MIRTAZAPINE 15 MG PO TABS
7.5000 mg | ORAL_TABLET | ORAL | Status: DC
  Filled 2023-01-31: qty 1

## 2023-01-31 MED ORDER — LORAZEPAM 2 MG/ML IJ SOLN
1.0000 mg | Freq: Once | INTRAMUSCULAR | Status: AC
  Administered 2023-01-31: 1 mg via INTRAVENOUS
  Filled 2023-01-31: qty 1

## 2023-01-31 NOTE — ED Notes (Addendum)
[facility] RN notified on patient's discharge plan , secretary notified [facility] for transport . Son notified on patient's return to nursing facility.

## 2023-01-31 NOTE — ED Notes (Signed)
Pt attempting to get OOB despite multiple attempts for redirection. Attempted home PO meds, pt spit meds and water back out and refuses to take PO meds. MD Zackowski made aware. IV orders placed.
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
## 2023-01-31 NOTE — ED Provider Notes (Signed)
Patient's blood sugars look pretty good labs without significant abnormalities.  Blood sugar now is 162.  No evidence of any DKA beta-hydroxybutyrate is normal.  Venous blood gas without any acidosis.  Did start with blood sugar being a little higher.  But his come down nicely.  Patient stable to be discharged.   Vanetta Mulders, MD 01/31/23 1850

## 2023-01-31 NOTE — ED Provider Notes (Signed)
[place] EMERGENCY DEPARTMENT AT [facility] Provider Note   CSN: [id number] Arrival date & time: 01/31/23  1204     History  Chief Complaint  Patient presents with   Hyperglycemia    Allison [name] is a 89 y.o. female with PMH as listed below who was sent to emergency department via EMS from [facility] for hyperglycemia.  They report she was in the 200s yesterday at her baseline but this morning they noted it to be 528 mg/dL.  They gave her insulin 10 units this morning and EMS rechecked it getting a CBG of 455 mg/dL.  Currently the facility reported to EMS that she is at her baseline mentation due to dementia.  Patient in the ED has no complaints except chronic BL hip pain. No abd pain, CP, dizziness, nausea/vomiting, leg swelling.  Past Medical History:  Diagnosis Date   Acute ischemic stroke (HCC) 10/29/2015   Carpal tunnel syndrome    Dementia (HCC)    Diabetes mellitus without complication (HCC)    Dysphagia    GERD (gastroesophageal reflux disease)    Hiatal hernia    HLD (hyperlipidemia)    Hx of adenomatous colonic polyps 06/19/2014   Hyperlipidemia    Hypertension    Hypothyroidism    Onychomycosis 03/05/2012   Pain in lower limb 04/18/2013   Sciatic nerve pain    Stroke-like symptoms 10/28/2015   Vertigo        Home Medications Prior to Admission medications   Medication Sig Start Date End Date Taking? Authorizing Provider  acetaminophen (TYLENOL) 500 MG tablet Take 500 mg by mouth daily as needed for mild pain.    [provider]  aspirin EC 81 MG tablet Take 1 tablet (81 mg total) by mouth daily. Swallow whole. 08/18/22 08/18/23  Almon Hercules, MD  atorvastatin (LIPITOR) 40 MG tablet Take 1 tablet (40 mg total) by mouth daily. 08/18/22   Almon Hercules, MD  azithromycin (ZITHROMAX) 500 MG tablet Take 1 tablet (500 mg total) by mouth at bedtime. 11/25/22   Azucena Fallen, MD  bisacodyl (DULCOLAX) 10 MG suppository Place 10 mg rectally  daily as needed for moderate constipation.    [provider]  Blood Glucose Monitoring Suppl DEVI 1 each by Does not apply route in the morning, at noon, and at bedtime. May substitute to any manufacturer covered by patient's insurance. 08/18/22   Almon Hercules, MD  calcium carbonate (TUMS EX) 750 MG chewable tablet Chew 2 tablets by mouth daily as needed (indigestion).    [provider]  diclofenac Sodium (VOLTAREN ARTHRITIS PAIN) 1 % GEL Apply 2 g topically daily as needed (for pain).    [provider]  insulin aspart protamine - aspart (NOVOLOG 70/30 MIX) (70-30) 100 UNIT/ML FlexPen Inject 10 Units into the skin 2 (two) times daily with a meal. 08/31/22   Meredeth Ide, MD  Insulin Pen Needle (PEN NEEDLES 3/16") 31G X 5 MM MISC 1 Pen by Does not apply route in the morning and at bedtime. 08/18/22   Almon Hercules, MD  levothyroxine (SYNTHROID) 125 MCG tablet Take 1 tablet (125 mcg total) by mouth daily before breakfast. 08/18/22   Almon Hercules, MD  magnesium hydroxide (MILK OF MAGNESIA) 400 MG/5ML suspension Take 30 mLs by mouth daily as needed for mild constipation.    [provider]  melatonin 5 MG TABS Take 5 mg by mouth at bedtime.    [provider]  mirtazapine (REMERON) 7.5 MG tablet Take 7.5 mg by mouth at bedtime.    [provider]  pantoprazole (PROTONIX) 20 MG tablet Take 1 tablet (20 mg total) by mouth daily. 01/10/22   Bethann Berkshire, MD      Allergies    Lantus [insulin glargine], Levemir [insulin detemir], Pineapple, Codeine, Invokana [canagliflozin], Penicillins, Sulfa antibiotics, Tradjenta [linagliptin], Vioxx [rofecoxib], and Exelon [rivastigmine]    Review of Systems   Review of Systems A 10 point review of systems was performed and is negative unless otherwise reported in HPI.  Physical Exam Updated Vital Signs BP (!) 115/103   Pulse (!) 101   Temp 98.2 F (36.8 C) (Temporal)   Resp 17   Ht 5\' 4"  (1.626 m)   Wt 57  kg   SpO2 100%   BMI 21.57 kg/m  Physical Exam General: Normal appearing female, lying in bed.  HEENT: PERRLA, Sclera anicteric, MMM, trachea midline.  Cardiology: RRR, no murmurs/rubs/gallops. BL radial and DP pulses equal bilaterally.  Resp: Normal respiratory rate and effort. CTAB, no wheezes, rhonchi, crackles.  Abd: Soft, non-tender, non-distended. No rebound tenderness or guarding.  GU: Deferred. MSK: No peripheral edema or signs of trauma. Extremities without deformity or TTP. No cyanosis or clubbing. Skin: warm, dry. No rashes or lesions. Back: No CVA tenderness Neuro: A&Ox4, CNs II-XII grossly intact. MAEs. Sensation grossly intact.  Psych: Normal mood and affect.   ED Results / Procedures / Treatments   Labs (all labs ordered are listed, but only abnormal results are displayed) Labs Reviewed  BASIC METABOLIC PANEL - Abnormal; Notable for the following components:      Result Value   Glucose, Bld 264 (*)    GFR, Estimated 60 (*)    All other components within normal limits  CBC WITH DIFFERENTIAL/PLATELET - Abnormal; Notable for the following components:   RBC 3.80 (*)    Hemoglobin 9.4 (*)    HCT 29.0 (*)    MCV 76.3 (*)    MCH 24.7 (*)    All other components within normal limits  CBG MONITORING, ED - Abnormal; Notable for the following components:   Glucose-Capillary 299 (*)    All other components within normal limits  I-STAT VENOUS BLOOD GAS, ED - Abnormal; Notable for the following components:   pH, Ven 7.483 (*)    pCO2, Ven 40.2 (*)    pO2, Ven 50 (*)    Bicarbonate 30.1 (*)    Acid-Base Excess 6.0 (*)    HCT 30.0 (*)    Hemoglobin 10.2 (*)    All other components within normal limits  CBG MONITORING, ED - Abnormal; Notable for the following components:   Glucose-Capillary 162 (*)    All other components within normal limits  BETA-HYDROXYBUTYRIC ACID    EKG EKG Interpretation Date/Time:  Sunday January 31 2023 12:25:07 EST Ventricular Rate:   99 PR Interval:  176 QRS Duration:  64 QT Interval:  342 QTC Calculation: 438 R Axis:   45  Text Interpretation: Normal sinus rhythm Low voltage QRS Confirmed by Vivi Barrack [phone]) on 01/31/2023 1:39:26 PM  Radiology No results found.  Procedures Procedures    Medications Ordered in ED Medications  LORazepam (ATIVAN) injection 1 mg (1 mg Intravenous Given 01/31/23 2127)    ED Course/ Medical Decision Making/ A&P                          Medical Decision Making Amount and/or Complexity  of Data Reviewed Labs: ordered. Decision-making details documented in ED Course.  Risk Prescription drug management.    This patient presents to the ED for concern of hyperglycemia, this involves an extensive number of treatment options, and is a complaint that carries with it a high risk of complications and morbidity.  I considered the following differential and admission for this acute, potentially life threatening condition.   MDM:    Patient with isolated concern for hyperglycemia.  She also reports chronic bilateral hip pain but does not complain of anything else.  Her labs are not consistent with DKA.  No acidosis, no anion gap.  She has no fever or leukocytosis to indicate an infectious source of her hyperglycemia.  No abdominal tenderness with patient or flank pain to indicate cystitis or pyelonephritis.  Will give insulin and recheck her sugars.  For her mild decrease in her hemoglobin down to 9.4 from baseline 11-14, there was no melena or hematochezia reported the facility, patient is not able to answer these questions, none noted while she was in the emergency department. No abd TTP.  Patient will need to have her hemoglobin level rechecked with her PCP within 1-2 weeks.   Clinical Course as of 02/05/23 1915  Sun Jan 31, 2023  1338 WBC: 8.6 No leukocytosis  [HN]  1338 Hemoglobin(!): 9.4 Drop from ~12 two months ago [HN]  1338 Glucose-Capillary(!): 299 [HN]  1423  Hemoglobin(!): 9.4 BL appears to be ~11-14 [HN]    Clinical Course User Index [HN] Loetta Rough, MD      Labs: I Ordered, and personally interpreted labs.  The pertinent results include: Those listed above  Additional history obtained from chart review, EMS  Cardiac Monitoring: The patient was maintained on a cardiac monitor.  I personally viewed and interpreted the cardiac monitored which showed an underlying rhythm of: Normal sinus rhythm  Reevaluation: After the interventions noted above, I reevaluated the patient and found that they have :improved  Social Determinants of Health:  lives at [facility] facility  Disposition:  Patient is signed out to the oncoming ED physician Dr. Deretha Emory who is made aware of her history, presentation, exam, workup, and plan. Plan is to await remainder of labs and check sugar and likely DC.    Co morbidities that complicate the patient evaluation  Past Medical History:  Diagnosis Date   Acute ischemic stroke (HCC) 10/29/2015   Carpal tunnel syndrome    Dementia (HCC)    Diabetes mellitus without complication (HCC)    Dysphagia    GERD (gastroesophageal reflux disease)    Hiatal hernia    HLD (hyperlipidemia)    Hx of adenomatous colonic polyps 06/19/2014   Hyperlipidemia    Hypertension    Hypothyroidism    Onychomycosis 03/05/2012   Pain in lower limb 04/18/2013   Sciatic nerve pain    Stroke-like symptoms 10/28/2015   Vertigo      Medicines Meds ordered this encounter  Medications   DISCONTD: ondansetron (ZOFRAN) injection 4 mg   DISCONTD: lactated ringers bolus 500 mL   DISCONTD: melatonin tablet 5 mg   DISCONTD: mirtazapine (REMERON) tablet 7.5 mg   LORazepam (ATIVAN) injection 1 mg    I have reviewed the patients home medicines and have made adjustments as needed  Problem List / ED Course: Problem List Items Addressed This Visit   None Visit Diagnoses       Hyperglycemia    -  Primary  This note was created using dictation software, which may contain spelling or grammatical errors.    Loetta Rough, MD 02/05/23 [phone]

## 2023-01-31 NOTE — ED Provider Triage Note (Signed)
Emergency Medicine Provider Triage Evaluation Note  NANCIE HEMBREE , a 89 y.o. female  was evaluated in triage.  History of dementia.  Currently A&O x 3.  Patient sent to emergency department via EMS from [facility] for hyperglycemia.  They report she was in the 200s yesterday at her baseline but this morning they noted it to be 528.  They gave her insulin 10 units this morning and EMS rechecked it getting a CBG of 455.  Currently and reports that she is at her baseline mentation due to dementia.  She has no complaints of pain, nausea, vomiting.  Review of Systems  Positive: hyperglycemia Negative:    Physical Exam  BP (!) 173/87 (BP Location: Right Arm)   Pulse 99   Temp 97.9 F (36.6 C) (Oral)   Resp 14   Ht 5\' 4"  (1.626 m)   Wt 57 kg   SpO2 100%   BMI 21.57 kg/m  Gen:   Awake, no distress. A&Ox3 Resp:  Normal effort  MSK:   Moves extremities without difficulty  Other:    Medical Decision Making  Medically screening exam initiated at 12:39 PM.  Appropriate orders placed.  MURJANI ELMAN was informed that the remainder of the evaluation will be completed by another provider, this initial triage assessment does not replace that evaluation, and the importance of remaining in the ED until their evaluation is complete.  Labs ordered   Judithann Sheen, [place] 01/31/23 1240

## 2023-01-31 NOTE — ED Triage Notes (Addendum)
Pt to ED via [facility] from [facility]. Pt sent for hyperglycemia. Pt was 200s yesterday which was her baseline. This am it fsbs=528, pt received her asp pro 10U, per EMS, recheck, fsbs=455. Pt is at baseline mentation. Pt asleep at this time, responds to verbal, alert to person, situation, and time. Disoriented to place. Pt denies pain, dizziness, nausea or shortness of breath. Pt states, "I just feel funny".   EMS VS HR=99 98% RA 118/90 RR 22 Cbg 455

## 2023-01-31 NOTE — Discharge Instructions (Signed)
Workup.  Blood sugars have stabilized out.  No evidence of any acute abnormalities with the hyperglycemia.  Patient stable for discharge.

## 2023-03-28 ENCOUNTER — Emergency Department (HOSPITAL_COMMUNITY)
Admission: EM | Admit: 2023-03-28 | Discharge: 2023-03-29 | Disposition: A | Discharge status: Home / Self Care | Payer: Medicare (Managed Care) | Attending: Emergency Medicine | Admitting: Emergency Medicine

## 2023-03-28 ENCOUNTER — Other Ambulatory Visit: Payer: Self-pay

## 2023-03-28 ENCOUNTER — Encounter (HOSPITAL_COMMUNITY): Payer: Self-pay

## 2023-03-28 DIAGNOSIS — Z79899 Other long term (current) drug therapy: Secondary | ICD-10-CM | POA: Insufficient documentation

## 2023-03-28 DIAGNOSIS — R739 Hyperglycemia, unspecified: Secondary | ICD-10-CM | POA: Diagnosis present

## 2023-03-28 DIAGNOSIS — I10 Essential (primary) hypertension: Secondary | ICD-10-CM | POA: Insufficient documentation

## 2023-03-28 DIAGNOSIS — Z794 Long term (current) use of insulin: Secondary | ICD-10-CM | POA: Diagnosis not present

## 2023-03-28 DIAGNOSIS — R8281 Pyuria: Secondary | ICD-10-CM

## 2023-03-28 DIAGNOSIS — E1165 Type 2 diabetes mellitus with hyperglycemia: Secondary | ICD-10-CM | POA: Diagnosis not present

## 2023-03-28 DIAGNOSIS — F0390 Unspecified dementia without behavioral disturbance: Secondary | ICD-10-CM | POA: Insufficient documentation

## 2023-03-28 DIAGNOSIS — Z7982 Long term (current) use of aspirin: Secondary | ICD-10-CM | POA: Insufficient documentation

## 2023-03-28 DIAGNOSIS — E039 Hypothyroidism, unspecified: Secondary | ICD-10-CM | POA: Diagnosis not present

## 2023-03-28 LAB — COMPREHENSIVE METABOLIC PANEL
ALT: 8 U/L (ref 0–44)
AST: 31 U/L (ref 15–41)
Albumin: 2.6 g/dL — ABNORMAL LOW (ref 3.5–5.0)
Alkaline Phosphatase: 56 U/L (ref 38–126)
Anion gap: 9 (ref 5–15)
BUN: 16 mg/dL (ref 8–23)
CO2: 23 mmol/L (ref 22–32)
Calcium: 8.7 mg/dL — ABNORMAL LOW (ref 8.9–10.3)
Chloride: 103 mmol/L (ref 98–111)
Creatinine, Ser: 1.12 mg/dL — ABNORMAL HIGH (ref 0.44–1.00)
GFR, Estimated: 48 mL/min — ABNORMAL LOW (ref >60)
Glucose, Bld: 295 mg/dL — ABNORMAL HIGH (ref 70–99)
Potassium: 5.4 mmol/L — ABNORMAL HIGH (ref 3.5–5.1)
Sodium: 135 mmol/L (ref 135–145)
Total Bilirubin: 0.7 mg/dL (ref 0.0–1.2)
Total Protein: 6 g/dL — ABNORMAL LOW (ref 6.5–8.1)

## 2023-03-28 LAB — URINALYSIS, ROUTINE W REFLEX MICROSCOPIC
Bacteria, UA: NONE SEEN
Bilirubin Urine: NEGATIVE
Glucose, UA: 150 mg/dL — AB
Ketones, ur: NEGATIVE mg/dL
Nitrite: NEGATIVE
Protein, ur: 100 mg/dL — AB
RBC / HPF: >50 RBC/hpf (ref 0–5)
Specific Gravity, Urine: 1.013 (ref 1.005–1.030)
WBC, UA: >50 WBC/hpf (ref 0–5)
pH: 5 (ref 5.0–8.0)

## 2023-03-28 LAB — CBG MONITORING, ED
Glucose-Capillary: 296 mg/dL — ABNORMAL HIGH (ref 70–99)
Glucose-Capillary: 228 mg/dL — ABNORMAL HIGH (ref 70–99)

## 2023-03-28 LAB — CBC
HCT: 29.2 % — ABNORMAL LOW (ref 36.0–46.0)
Hemoglobin: 9.2 g/dL — ABNORMAL LOW (ref 12.0–15.0)
MCH: 22.5 pg — ABNORMAL LOW (ref 26.0–34.0)
MCHC: 31.5 g/dL (ref 30.0–36.0)
MCV: 71.6 fL — ABNORMAL LOW (ref 80.0–100.0)
Platelets: 276 10*3/uL (ref 150–400)
RBC: 4.08 MIL/uL (ref 3.87–5.11)
RDW: 16.6 % — ABNORMAL HIGH (ref 11.5–15.5)
WBC: 8.3 10*3/uL (ref 4.0–10.5)
nRBC: 0 % (ref 0.0–0.2)

## 2023-03-28 LAB — BETA-HYDROXYBUTYRIC ACID: Beta-Hydroxybutyric Acid: 0.14 mmol/L (ref 0.05–0.27)

## 2023-03-28 MED ORDER — INSULIN ASPART 100 UNIT/ML IJ SOLN
6.0000 [IU] | Freq: Once | INTRAMUSCULAR | Status: AC
Start: 2023-03-28 — End: 2023-03-28
  Administered 2023-03-28: 6 [IU] via SUBCUTANEOUS

## 2023-03-28 MED ORDER — SODIUM CHLORIDE 0.9 % IV BOLUS
500.0000 mL | Freq: Once | INTRAVENOUS | Status: AC
  Administered 2023-03-28: 500 mL via INTRAVENOUS

## 2023-03-28 NOTE — Discharge Instructions (Addendum)
 Your urine is very concentrated, likely from your uncontrolled diabetes, and dehydration.  Make sure you are drinking lots of fluids, cut down on your sweet items.  Take your insulin as prescribed, and follow-up with your primary care doctor.  If you have increased thirst, urination, or excessive hunger, or confusion please return to the ER

## 2023-03-28 NOTE — ED Notes (Signed)
 [facility] called

## 2023-03-28 NOTE — ED Provider Notes (Signed)
 Handoff from Glynis Smiles, PA, patient is here for hyperglycemia, has been eating pudding, sweet teas, and yogurt, noncompliant with diabetic diet.  CBG in the 500s.  Receiving fluids currently, pending repeat CBG. Physical Exam  BP (!) 151/80   Pulse 92   Temp 97.9 F (36.6 C)   Resp 18   Ht 5\' 4"  (1.626 m)   Wt 57 kg   SpO2 100%   BMI 21.57 kg/m   Physical Exam Vitals and nursing note reviewed.  Constitutional:      General: She is not in acute distress.    Appearance: She is well-developed.  HENT:     Head: Normocephalic and atraumatic.  Eyes:     Conjunctiva/sclera: Conjunctivae normal.  Cardiovascular:     Rate and Rhythm: Normal rate and regular rhythm.     Heart sounds: No murmur heard. Pulmonary:     Effort: Pulmonary effort is normal. No respiratory distress.     Breath sounds: Normal breath sounds.  Abdominal:     Palpations: Abdomen is soft.     Tenderness: There is no abdominal tenderness.  Musculoskeletal:        General: No swelling.     Cervical back: Neck supple.  Skin:    General: Skin is warm and dry.     Capillary Refill: Capillary refill takes less than 2 seconds.  Neurological:     Mental Status: She is alert.  Psychiatric:        Mood and Affect: Mood normal.     Procedures  Procedures  ED Course / MDM    Medical Decision Making Patient is here for hyperglycemia, with fluids, her glucose has improved, to the 200s on repeat.  Urine shows sterile pyuria I believe that is likely hemoconcentrated, from her dehydration, as the facility she is at, has not been giving her water, but just sweet tea, and she has been eating sugary products.  I discussed with the son at length, the importance of giving the patient lots of fluids, and not having her eat sweetened/enriched products.  He voiced understanding and is currently working on getting this fixed, the facility or transferring her to another facility.  She is well-appearing, is not in DKA, or HHS at this  time, and is discharged home.  Amount and/or Complexity of Data Reviewed Labs: ordered.  Risk Prescription drug management.       Pete Pelt, [place] 03/28/23 2315    Gwyneth Sprout, MD 03/31/23 [phone]

## 2023-03-28 NOTE — ED Notes (Signed)
 Placed PT on bedpan to get urine sample

## 2023-03-28 NOTE — ED Triage Notes (Signed)
 PT BIB [facility] from [facility]. Staff Concerned about her CBG reading in the 500's , when EMS arrived they got a CBG of 449.PT was eating sugary snacks as well when EMS arrived. (Sweet tea, yogurt , pudding)NS was given and EMS noted a strong urine smell.

## 2023-03-28 NOTE — ED Provider Notes (Signed)
 [place] EMERGENCY DEPARTMENT AT [facility] Provider Note   CSN: [id number] Arrival date & time: 03/28/23  1814     History  Chief Complaint  Patient presents with   Hyperglycemia    Allison [name] is a 89 y.o. female with history of acute ischemic stroke in 2017, dementia, diabetes, dysphagia, GERD, hypertension, hypothyroid, hyperlipidemia.  Patient presents to ED for evaluation of hyperglycemia.  Per triage note, the patient is being brought in from [facility].  The staff there is concerned about her CBG reading in the 500s.  With EMS, CBG was 449.  The patient was found eating sugary snacks such as sweet tea, yogurt and pudding.  Normal saline was given by EMS who also noted strong urine smell.  Here, patient alert to baseline.  Denies any pain.  Is endorsing excessive thirst and hunger but denies dysuria, fevers, nausea vomiting, abdominal pain, chest pain or shortness of breath.   Hyperglycemia      Home Medications Prior to Admission medications   Medication Sig Start Date End Date Taking? Authorizing Provider  acetaminophen (TYLENOL) 500 MG tablet Take 500 mg by mouth daily as needed for mild pain.    [provider]  aspirin EC 81 MG tablet Take 1 tablet (81 mg total) by mouth daily. Swallow whole. 08/18/22 08/18/23  Almon Hercules, MD  atorvastatin (LIPITOR) 40 MG tablet Take 1 tablet (40 mg total) by mouth daily. 08/18/22   Almon Hercules, MD  azithromycin (ZITHROMAX) 500 MG tablet Take 1 tablet (500 mg total) by mouth at bedtime. 11/25/22   Azucena Fallen, MD  bisacodyl (DULCOLAX) 10 MG suppository Place 10 mg rectally daily as needed for moderate constipation.    [provider]  Blood Glucose Monitoring Suppl DEVI 1 each by Does not apply route in the morning, at noon, and at bedtime. May substitute to any manufacturer covered by patient's insurance. 08/18/22   Almon Hercules, MD  calcium carbonate (TUMS EX) 750 MG chewable  tablet Chew 2 tablets by mouth daily as needed (indigestion).    [provider]  diclofenac Sodium (VOLTAREN ARTHRITIS PAIN) 1 % GEL Apply 2 g topically daily as needed (for pain).    [provider]  insulin aspart protamine - aspart (NOVOLOG 70/30 MIX) (70-30) 100 UNIT/ML FlexPen Inject 10 Units into the skin 2 (two) times daily with a meal. 08/31/22   Meredeth Ide, MD  Insulin Pen Needle (PEN NEEDLES 3/16") 31G X 5 MM MISC 1 Pen by Does not apply route in the morning and at bedtime. 08/18/22   Almon Hercules, MD  levothyroxine (SYNTHROID) 125 MCG tablet Take 1 tablet (125 mcg total) by mouth daily before breakfast. 08/18/22   Almon Hercules, MD  magnesium hydroxide (MILK OF MAGNESIA) 400 MG/5ML suspension Take 30 mLs by mouth daily as needed for mild constipation.    [provider]  melatonin 5 MG TABS Take 5 mg by mouth at bedtime.    [provider]  mirtazapine (REMERON) 7.5 MG tablet Take 7.5 mg by mouth at bedtime.    [provider]  pantoprazole (PROTONIX) 20 MG tablet Take 1 tablet (20 mg total) by mouth daily. 01/10/22   Bethann Berkshire, MD      Allergies    Lantus [insulin glargine], Levemir [insulin detemir], Pineapple, Codeine, Invokana [canagliflozin], Penicillins, Sulfa antibiotics, Tradjenta [linagliptin], Vioxx [rofecoxib], and Exelon [rivastigmine]    Review of Systems   Review  of Systems  Unable to perform ROS: Dementia (Level 5 caveat)    Physical Exam Updated Vital Signs BP (!) 146/76   Pulse 87   Temp 97.6 F (36.4 C) (Oral)   Resp (!) 23   Ht 5\' 4"  (1.626 m)   Wt 57 kg   SpO2 100%   BMI 21.57 kg/m  Physical Exam Vitals and nursing note reviewed.  Constitutional:      General: She is not in acute distress.    Appearance: She is well-developed.  HENT:     Head: Normocephalic and atraumatic.  Eyes:     Conjunctiva/sclera: Conjunctivae normal.  Cardiovascular:     Rate and Rhythm: Normal rate and regular rhythm.      Heart sounds: No murmur heard. Pulmonary:     Effort: Pulmonary effort is normal. No respiratory distress.     Breath sounds: Normal breath sounds.  Abdominal:     Palpations: Abdomen is soft.     Tenderness: There is no abdominal tenderness.  Musculoskeletal:        General: No swelling.     Cervical back: Neck supple.  Skin:    General: Skin is warm and dry.     Capillary Refill: Capillary refill takes less than 2 seconds.  Neurological:     Mental Status: She is alert. Mental status is at baseline.     Comments: Follows commands appropriately  Psychiatric:        Mood and Affect: Mood normal.     ED Results / Procedures / Treatments   Labs (all labs ordered are listed, but only abnormal results are displayed) Labs Reviewed  COMPREHENSIVE METABOLIC PANEL - Abnormal; Notable for the following components:      Result Value   Potassium 5.4 (*)    Glucose, Bld 295 (*)    Creatinine, Ser 1.12 (*)    Calcium 8.7 (*)    Total Protein 6.0 (*)    Albumin 2.6 (*)    GFR, Estimated 48 (*)    All other components within normal limits  CBC - Abnormal; Notable for the following components:   Hemoglobin 9.2 (*)    HCT 29.2 (*)    MCV 71.6 (*)    MCH 22.5 (*)    RDW 16.6 (*)    All other components within normal limits  CBG MONITORING, ED - Abnormal; Notable for the following components:   Glucose-Capillary 296 (*)    All other components within normal limits  BETA-HYDROXYBUTYRIC ACID  URINALYSIS, ROUTINE W REFLEX MICROSCOPIC  CBG MONITORING, ED    EKG None  Radiology No results found.  Procedures Procedures   Medications Ordered in ED Medications  sodium chloride 0.9 % bolus 500 mL (has no administration in time range)  sodium chloride 0.9 % bolus 500 mL (0 mLs Intravenous Stopped 03/28/23 2033)  insulin aspart (novoLOG) injection 6 Units (6 Units Subcutaneous Given 03/28/23 2049)    ED Course/ Medical Decision Making/ A&P  Medical Decision Making Amount and/or  Complexity of Data Reviewed Labs: ordered.  Risk Prescription drug management.   89 year old female presents for evaluation.  Please see HPI for further details.  On examination patient is afebrile and nontachycardic.  Lung sounds are clear bilaterally, she is not hypoxic.  Abdomen is soft and compressible.  Neurological examination at baseline.  Follows commands appropriately.  Initial CBG 96.  CBC reveals hemoglobin 9.2 which is baseline, no white count elevation.  CMP with potassium 5.4, glucose 205, baseline creatinine 1.12,  anion gap 9.  Beta-hydroxybutyrate acid 0.14.  Patient will receive 1 L of fluid, 16 to his insulin.  Have signed patient out to proximal PA-C pending reassessment of patient CBG.  Anticipate discharge home as long as CBG is declining appropriately.   Final Clinical Impression(s) / ED Diagnoses Final diagnoses:  Hyperglycemia    Rx / DC Orders ED Discharge Orders     None         Clent Ridges 03/28/23 2101    Benjiman Core, MD 03/28/23 2145

## 2023-12-27 ENCOUNTER — Emergency Department (HOSPITAL_COMMUNITY)
Admission: EM | Admit: 2023-12-27 | Discharge: 2023-12-27 | Disposition: A | Discharge status: Skilled Nursing Facility | Payer: Medicare (Managed Care) | Attending: Emergency Medicine | Admitting: Emergency Medicine

## 2023-12-27 ENCOUNTER — Other Ambulatory Visit: Payer: Self-pay

## 2023-12-27 ENCOUNTER — Emergency Department (HOSPITAL_COMMUNITY): Payer: Medicare (Managed Care)

## 2023-12-27 DIAGNOSIS — Z794 Long term (current) use of insulin: Secondary | ICD-10-CM | POA: Diagnosis not present

## 2023-12-27 DIAGNOSIS — Z79899 Other long term (current) drug therapy: Secondary | ICD-10-CM | POA: Diagnosis not present

## 2023-12-27 DIAGNOSIS — S0083XA Contusion of other part of head, initial encounter: Secondary | ICD-10-CM | POA: Diagnosis not present

## 2023-12-27 DIAGNOSIS — S0990XA Unspecified injury of head, initial encounter: Secondary | ICD-10-CM | POA: Diagnosis present

## 2023-12-27 DIAGNOSIS — F0390 Unspecified dementia without behavioral disturbance: Secondary | ICD-10-CM | POA: Diagnosis not present

## 2023-12-27 DIAGNOSIS — S0012XA Contusion of left eyelid and periocular area, initial encounter: Secondary | ICD-10-CM | POA: Diagnosis not present

## 2023-12-27 LAB — CBC WITH DIFFERENTIAL/PLATELET
Abs Immature Granulocytes: 0.03 K/uL (ref 0.00–0.07)
Basophils Absolute: 0.1 K/uL (ref 0.0–0.1)
Basophils Relative: 1 %
Eosinophils Absolute: 0.1 K/uL (ref 0.0–0.5)
Eosinophils Relative: 2 %
HCT: 38.3 % (ref 36.0–46.0)
Hemoglobin: 12.2 g/dL (ref 12.0–15.0)
Immature Granulocytes: 0 %
Lymphocytes Relative: 23 %
Lymphs Abs: 1.7 K/uL (ref 0.7–4.0)
MCH: 25.8 pg — ABNORMAL LOW (ref 26.0–34.0)
MCHC: 31.9 g/dL (ref 30.0–36.0)
MCV: 81.1 fL (ref 80.0–100.0)
Monocytes Absolute: 0.8 K/uL (ref 0.1–1.0)
Monocytes Relative: 11 %
Neutro Abs: 4.8 K/uL (ref 1.7–7.7)
Neutrophils Relative %: 63 %
Platelets: 266 K/uL (ref 150–400)
RBC: 4.72 MIL/uL (ref 3.87–5.11)
RDW: 15.9 % — ABNORMAL HIGH (ref 11.5–15.5)
WBC: 7.6 K/uL (ref 4.0–10.5)
nRBC: 0 % (ref 0.0–0.2)

## 2023-12-27 LAB — COMPREHENSIVE METABOLIC PANEL WITH GFR
ALT: 16 U/L (ref 0–44)
AST: 20 U/L (ref 15–41)
Albumin: 3.4 g/dL — ABNORMAL LOW (ref 3.5–5.0)
Alkaline Phosphatase: 67 U/L (ref 38–126)
Anion gap: 8 (ref 5–15)
BUN: 19 mg/dL (ref 8–23)
CO2: 31 mmol/L (ref 22–32)
Calcium: 9.4 mg/dL (ref 8.9–10.3)
Chloride: 98 mmol/L (ref 98–111)
Creatinine, Ser: 1 mg/dL (ref 0.44–1.00)
GFR, Estimated: 55 mL/min — ABNORMAL LOW (ref >60)
Glucose, Bld: 379 mg/dL — ABNORMAL HIGH (ref 70–99)
Potassium: 4 mmol/L (ref 3.5–5.1)
Sodium: 137 mmol/L (ref 135–145)
Total Bilirubin: 0.5 mg/dL (ref 0.0–1.2)
Total Protein: 6.8 g/dL (ref 6.5–8.1)

## 2023-12-27 LAB — CBG MONITORING, ED: Glucose-Capillary: 359 mg/dL — ABNORMAL HIGH (ref 70–99)

## 2023-12-27 NOTE — ED Provider Notes (Signed)
 [place] [facility] AT [facility] Provider Note   CSN: [id number] Arrival date & time: 12/27/23  1716     Patient presents with: Allison [name] is a 89 y.o. female.    Fall   This patient is a 89 year old female presenting to the hospital with a complaint of a fall.  The patient has dementia and does not remember what happened but evidently she fell out of the wheelchair face first and hit her face.  She has a hematoma over the left eye.  She denies any other complaints, she does not know why she is here.    Prior to Admission medications  Medication Sig Start Date End Date Taking? Authorizing Provider  acetaminophen  (TYLENOL ) 500 MG tablet Take 500 mg by mouth daily as needed for mild pain.    [provider]  atorvastatin  (LIPITOR ) 40 MG tablet Take 1 tablet (40 mg total) by mouth daily. 08/18/22   Gonfa, Taye T, MD  azithromycin  (ZITHROMAX ) 500 MG tablet Take 1 tablet (500 mg total) by mouth at bedtime. 11/25/22   Lue Elsie BROCKS, MD  bisacodyl  (DULCOLAX) 10 MG suppository Place 10 mg rectally daily as needed for moderate constipation.    [provider]  Blood Glucose Monitoring Suppl DEVI 1 each by Does not apply route in the morning, at noon, and at bedtime. May substitute to any manufacturer covered by patient's insurance. 08/18/22   Gonfa, Taye T, MD  calcium  carbonate (TUMS EX) 750 MG chewable tablet Chew 2 tablets by mouth daily as needed (indigestion).    [provider]  diclofenac  Sodium (VOLTAREN  ARTHRITIS PAIN) 1 % GEL Apply 2 g topically daily as needed (for pain).    [provider]  insulin  aspart protamine - aspart (NOVOLOG  70/30 MIX) (70-30) 100 UNIT/ML FlexPen Inject 10 Units into the skin 2 (two) times daily with a meal. 08/31/22   Drusilla Sabas RAMAN, MD  Insulin  Pen Needle (PEN NEEDLES 3/16) 31G X 5 MM MISC 1 Pen by Does not apply route in the morning and at bedtime. 08/18/22   Gonfa, Taye T, MD   levothyroxine  (SYNTHROID ) 125 MCG tablet Take 1 tablet (125 mcg total) by mouth daily before breakfast. 08/18/22   Gonfa, Taye T, MD  magnesium  hydroxide (MILK OF MAGNESIA) 400 MG/5ML suspension Take 30 mLs by mouth daily as needed for mild constipation.    [provider]  melatonin 5 MG TABS Take 5 mg by mouth at bedtime.    [provider]  mirtazapine  (REMERON ) 7.5 MG tablet Take 7.5 mg by mouth at bedtime.    [provider]  pantoprazole  (PROTONIX ) 20 MG tablet Take 1 tablet (20 mg total) by mouth daily. 01/10/22   Suzette Pac, MD    Allergies: Lantus  [insulin  glargine], Levemir  [insulin  detemir], Pineapple, Codeine, Invokana [canagliflozin], Penicillins, Sulfa antibiotics, Tradjenta  [linagliptin ], Vioxx [rofecoxib], and Exelon [rivastigmine]    Review of Systems  All other systems reviewed and are negative.   Updated Vital Signs BP (!) 180/96 (BP Location: Left Arm)   Pulse 90   Temp 97.8 F (36.6 C) (Oral)   Resp (!) 21   SpO2 99%   Physical Exam Vitals and nursing note reviewed.  Constitutional:      General: She is not in acute distress.    Appearance: She is well-developed.  HENT:     Head: Normocephalic.     Comments: 3 cm hematoma located over the left eyebrow  Mouth/Throat:     Pharynx: No oropharyngeal exudate.  Eyes:     General: No scleral icterus.       Right eye: No discharge.        Left eye: No discharge.     Conjunctiva/sclera: Conjunctivae normal.     Pupils: Pupils are equal, round, and reactive to light.  Neck:     Thyroid : No thyromegaly.     Vascular: No JVD.  Cardiovascular:     Rate and Rhythm: Normal rate and regular rhythm.     Heart sounds: Normal heart sounds. No murmur heard.    No friction rub. No gallop.  Pulmonary:     Effort: Pulmonary effort is normal. No respiratory distress.     Breath sounds: Normal breath sounds. No wheezing or rales.  Abdominal:     General: Bowel sounds are normal. There is no  distension.     Palpations: Abdomen is soft. There is no mass.     Tenderness: There is no abdominal tenderness.  Musculoskeletal:        General: Tenderness (Over left forehead hematoma) present. Normal range of motion.     Cervical back: Normal range of motion and neck supple.     Right lower leg: No edema.     Left lower leg: No edema.  Lymphadenopathy:     Cervical: No cervical adenopathy.  Skin:    General: Skin is warm and dry.     Findings: No erythema or rash.  Neurological:     Mental Status: She is alert.     Coordination: Coordination normal.     Comments: Patient is able to follow some simple commands, she is confused as to location and reason for her visit  Psychiatric:        Behavior: Behavior normal.     (all labs ordered are listed, but only abnormal results are displayed) Labs Reviewed  CBC WITH DIFFERENTIAL/PLATELET - Abnormal; Notable for the following components:      Result Value   MCH 25.8 (*)    RDW 15.9 (*)    All other components within normal limits  COMPREHENSIVE METABOLIC PANEL WITH GFR - Abnormal; Notable for the following components:   Glucose, Bld 379 (*)    Albumin 3.4 (*)    GFR, Estimated 55 (*)    All other components within normal limits  CBG MONITORING, ED - Abnormal; Notable for the following components:   Glucose-Capillary 359 (*)    All other components within normal limits  URINALYSIS, ROUTINE W REFLEX MICROSCOPIC    EKG: EKG Interpretation Date/Time:  Monday December 27 2023 18:16:54 EST Ventricular Rate:  0 PR Interval:  156 QRS Duration:  0 QT Interval:  0 QTC Calculation: 0 R Axis:   0  Text Interpretation: not billable Confirmed by Cleotilde Rogue ([id number]) on 12/27/2023 7:44:44 PM  Radiology: CT Cervical Spine Wo Contrast Result Date: 12/27/2023 EXAM: CT CERVICAL SPINE WITHOUT CONTRAST 12/27/2023 06:13:07 PM TECHNIQUE: CT of the cervical spine was performed without the administration of intravenous contrast.  Multiplanar reformatted images are provided for review. Automated exposure control, iterative reconstruction, and/or weight based adjustment of the mA/kV was utilized to reduce the radiation dose to as low as reasonably achievable. COMPARISON: None available. CLINICAL HISTORY: Neck trauma (Age >= 65y) FINDINGS: BONES AND ALIGNMENT: No acute fracture or traumatic malalignment. DEGENERATIVE CHANGES: Mild degenerative changes of the mid cervical spine. SOFT TISSUES: No prevertebral soft tissue swelling. IMPRESSION: 1. No evidence of acute traumatic injury.  Electronically signed by: Pinkie Pebbles MD 12/27/2023 06:50 PM EST RP Workstation: [id number]   CT Head Wo Contrast Result Date: 12/27/2023 EXAM: CT HEAD WITHOUT 12/27/2023 06:13:07 PM TECHNIQUE: CT of the head was performed without the administration of intravenous contrast. Automated exposure control, iterative reconstruction, and/or weight based adjustment of the mA/kV was utilized to reduce the radiation dose to as low as reasonably achievable. COMPARISON: 11/21/2022 CLINICAL HISTORY: Head trauma, minor (Age >= 65y) FINDINGS: BRAIN AND VENTRICLES: No acute intracranial hemorrhage. No mass effect or midline shift. No extra-axial fluid collection. Old left basal ganglia infarct, unchanged. No evidence of acute infarct. No hydrocephalus. Mild diffuse atrophy. Mild periventricular white matter hypodensity, likely chronic small vessel ischemic change. Atherosclerotic calcifications within the cavernous internal carotid arteries. ORBITS: No acute abnormality. SINUSES AND MASTOIDS: No acute abnormality. SOFT TISSUES AND SKULL: No acute skull fracture. Small left forehead scalp hematoma. IMPRESSION: 1. No acute intracranial abnormality. 2. Small left forehead scalp hematoma. Electronically signed by: Pinkie Pebbles MD 12/27/2023 06:49 PM EST RP Workstation: [id number]     Procedures   Medications Ordered in the ED - No data to display                                   Medical Decision Making   lalThis patient presents to the ED for concern of trauma differential diagnosis includes head injury, concussion, hemorrhage, fracture    Additional history obtained   Additional history obtained from Electronic Medical Record External records from outside source obtained and reviewed including medical record   Lab Tests:  I Ordered, and personally interpreted labs.  The pertinent results include: CBC and metabolic panel unremarkable, glucose was slightly elevated at 379, patient has not had her evening insulin    Imaging Studies ordered:  I ordered imaging studies including CT scan of the head and cervical spine I independently visualized and interpreted imaging which showed no acute fractures or intracranial injuries I agree with the radiologist interpretation   Medicines ordered and prescription drug management:     I have reviewed the patients home medicines and have made adjustments as needed   Problem List / ED Course:  Patient with hematoma to the forehead minor head injury, no seizures, no vomiting, no signs of intracranial injury   Social Determinants of Health:  Dementia  Stable for discharge, can have evening insulin  when she gets back to her facility        Final diagnoses:  Injury of head, initial encounter    ED Discharge Orders     None          Cleotilde Rogue, MD 12/27/23 2009

## 2023-12-27 NOTE — ED Notes (Signed)
 When you have time, Zeva Leber (son) [phone] would like an update on pt.

## 2023-12-27 NOTE — ED Triage Notes (Signed)
 Patient with hx of dementia here after unwitnessed fall out of wheelchair. Hematoma on face with abrasion. No LOC. Acting normal per staff.

## 2023-12-27 NOTE — Discharge Instructions (Signed)
 Thankfully all of your x-rays looks good, there is no signs of brain injury, there is a mild concussion for which you can take Tylenol .  Please take your evening diabetes medicine when you get home as your blood sugar was slightly elevated

## 2023-12-27 NOTE — ED Notes (Signed)
 Called and spoke with patient's son regarding pt status and care.

## 2023-12-27 NOTE — ED Notes (Signed)
 [facility] called

## 2023-12-31 ENCOUNTER — Inpatient Hospital Stay (HOSPITAL_COMMUNITY)
Admission: EM | Admit: 2023-12-31 | Discharge: 2024-01-04 | DRG: 640 | Disposition: A | Discharge status: Skilled Nursing Facility | Payer: Medicare (Managed Care) | Source: Skilled Nursing Facility | Attending: Internal Medicine | Admitting: Internal Medicine

## 2023-12-31 ENCOUNTER — Other Ambulatory Visit: Payer: Self-pay

## 2023-12-31 ENCOUNTER — Emergency Department (HOSPITAL_COMMUNITY): Payer: Medicare (Managed Care)

## 2023-12-31 ENCOUNTER — Encounter (HOSPITAL_COMMUNITY): Payer: Self-pay | Admitting: Internal Medicine

## 2023-12-31 DIAGNOSIS — S0512XA Contusion of eyeball and orbital tissues, left eye, initial encounter: Secondary | ICD-10-CM | POA: Diagnosis present

## 2023-12-31 DIAGNOSIS — G308 Other Alzheimer's disease: Secondary | ICD-10-CM | POA: Diagnosis not present

## 2023-12-31 DIAGNOSIS — Z79899 Other long term (current) drug therapy: Secondary | ICD-10-CM

## 2023-12-31 DIAGNOSIS — E871 Hypo-osmolality and hyponatremia: Secondary | ICD-10-CM | POA: Diagnosis present

## 2023-12-31 DIAGNOSIS — E870 Hyperosmolality and hypernatremia: Principal | ICD-10-CM | POA: Diagnosis present

## 2023-12-31 DIAGNOSIS — G9341 Metabolic encephalopathy: Secondary | ICD-10-CM | POA: Diagnosis present

## 2023-12-31 DIAGNOSIS — E1165 Type 2 diabetes mellitus with hyperglycemia: Secondary | ICD-10-CM | POA: Diagnosis present

## 2023-12-31 DIAGNOSIS — F32A Depression, unspecified: Secondary | ICD-10-CM | POA: Diagnosis present

## 2023-12-31 DIAGNOSIS — Z794 Long term (current) use of insulin: Secondary | ICD-10-CM

## 2023-12-31 DIAGNOSIS — Z83511 Family history of glaucoma: Secondary | ICD-10-CM

## 2023-12-31 DIAGNOSIS — I739 Peripheral vascular disease, unspecified: Secondary | ICD-10-CM | POA: Diagnosis not present

## 2023-12-31 DIAGNOSIS — K219 Gastro-esophageal reflux disease without esophagitis: Secondary | ICD-10-CM | POA: Diagnosis not present

## 2023-12-31 DIAGNOSIS — I5032 Chronic diastolic (congestive) heart failure: Secondary | ICD-10-CM | POA: Diagnosis present

## 2023-12-31 DIAGNOSIS — Z888 Allergy status to other drugs, medicaments and biological substances status: Secondary | ICD-10-CM

## 2023-12-31 DIAGNOSIS — E785 Hyperlipidemia, unspecified: Secondary | ICD-10-CM | POA: Diagnosis not present

## 2023-12-31 DIAGNOSIS — I10 Essential (primary) hypertension: Secondary | ICD-10-CM | POA: Diagnosis present

## 2023-12-31 DIAGNOSIS — E039 Hypothyroidism, unspecified: Secondary | ICD-10-CM | POA: Diagnosis not present

## 2023-12-31 DIAGNOSIS — R4182 Altered mental status, unspecified: Secondary | ICD-10-CM

## 2023-12-31 DIAGNOSIS — Z8673 Personal history of transient ischemic attack (TIA), and cerebral infarction without residual deficits: Secondary | ICD-10-CM | POA: Diagnosis not present

## 2023-12-31 DIAGNOSIS — E860 Dehydration: Secondary | ICD-10-CM | POA: Diagnosis present

## 2023-12-31 DIAGNOSIS — Z803 Family history of malignant neoplasm of breast: Secondary | ICD-10-CM

## 2023-12-31 DIAGNOSIS — Z820 Family history of epilepsy and other diseases of the nervous system: Secondary | ICD-10-CM

## 2023-12-31 DIAGNOSIS — F411 Generalized anxiety disorder: Secondary | ICD-10-CM | POA: Diagnosis not present

## 2023-12-31 DIAGNOSIS — G309 Alzheimer's disease, unspecified: Secondary | ICD-10-CM | POA: Diagnosis present

## 2023-12-31 DIAGNOSIS — W050XXA Fall from non-moving wheelchair, initial encounter: Secondary | ICD-10-CM | POA: Diagnosis present

## 2023-12-31 DIAGNOSIS — R739 Hyperglycemia, unspecified: Secondary | ICD-10-CM

## 2023-12-31 DIAGNOSIS — M792 Neuralgia and neuritis, unspecified: Secondary | ICD-10-CM | POA: Diagnosis present

## 2023-12-31 DIAGNOSIS — I69391 Dysphagia following cerebral infarction: Secondary | ICD-10-CM

## 2023-12-31 DIAGNOSIS — E1151 Type 2 diabetes mellitus with diabetic peripheral angiopathy without gangrene: Secondary | ICD-10-CM | POA: Diagnosis present

## 2023-12-31 DIAGNOSIS — G9340 Encephalopathy, unspecified: Secondary | ICD-10-CM | POA: Diagnosis not present

## 2023-12-31 DIAGNOSIS — I5030 Unspecified diastolic (congestive) heart failure: Secondary | ICD-10-CM | POA: Insufficient documentation

## 2023-12-31 DIAGNOSIS — F0283 Dementia in other diseases classified elsewhere, unspecified severity, with mood disturbance: Secondary | ICD-10-CM | POA: Diagnosis present

## 2023-12-31 DIAGNOSIS — Z800 Family history of malignant neoplasm of digestive organs: Secondary | ICD-10-CM

## 2023-12-31 DIAGNOSIS — Z91018 Allergy to other foods: Secondary | ICD-10-CM

## 2023-12-31 DIAGNOSIS — E1140 Type 2 diabetes mellitus with diabetic neuropathy, unspecified: Secondary | ICD-10-CM | POA: Diagnosis present

## 2023-12-31 DIAGNOSIS — Z8249 Family history of ischemic heart disease and other diseases of the circulatory system: Secondary | ICD-10-CM

## 2023-12-31 DIAGNOSIS — Z880 Allergy status to penicillin: Secondary | ICD-10-CM

## 2023-12-31 DIAGNOSIS — Z7982 Long term (current) use of aspirin: Secondary | ICD-10-CM

## 2023-12-31 DIAGNOSIS — Z882 Allergy status to sulfonamides status: Secondary | ICD-10-CM

## 2023-12-31 DIAGNOSIS — I110 Hypertensive heart disease with heart failure: Secondary | ICD-10-CM | POA: Diagnosis present

## 2023-12-31 DIAGNOSIS — Z79890 Hormone replacement therapy (postmenopausal): Secondary | ICD-10-CM

## 2023-12-31 DIAGNOSIS — Z825 Family history of asthma and other chronic lower respiratory diseases: Secondary | ICD-10-CM

## 2023-12-31 DIAGNOSIS — F0280 Dementia in other diseases classified elsewhere without behavioral disturbance: Secondary | ICD-10-CM | POA: Diagnosis present

## 2023-12-31 DIAGNOSIS — Z66 Do not resuscitate: Secondary | ICD-10-CM | POA: Diagnosis present

## 2023-12-31 HISTORY — DX: Cerebral infarction, unspecified: I63.9

## 2023-12-31 LAB — CBC WITH DIFFERENTIAL/PLATELET
Abs Immature Granulocytes: 0.05 K/uL (ref 0.00–0.07)
Basophils Absolute: 0.1 K/uL (ref 0.0–0.1)
Basophils Relative: 1 %
Eosinophils Absolute: 0.1 K/uL (ref 0.0–0.5)
Eosinophils Relative: 1 %
HCT: 37.7 % (ref 36.0–46.0)
Hemoglobin: 12.3 g/dL (ref 12.0–15.0)
Immature Granulocytes: 1 %
Lymphocytes Relative: 19 %
Lymphs Abs: 1.9 K/uL (ref 0.7–4.0)
MCH: 26.9 pg (ref 26.0–34.0)
MCHC: 32.6 g/dL (ref 30.0–36.0)
MCV: 82.3 fL (ref 80.0–100.0)
Monocytes Absolute: 0.8 K/uL (ref 0.1–1.0)
Monocytes Relative: 8 %
Neutro Abs: 6.9 K/uL (ref 1.7–7.7)
Neutrophils Relative %: 70 %
Platelets: 263 K/uL (ref 150–400)
RBC: 4.58 MIL/uL (ref 3.87–5.11)
RDW: 15.9 % — ABNORMAL HIGH (ref 11.5–15.5)
WBC: 9.8 K/uL (ref 4.0–10.5)
nRBC: 0 % (ref 0.0–0.2)

## 2023-12-31 LAB — BASIC METABOLIC PANEL WITH GFR
Anion gap: 9 (ref 5–15)
BUN: 22 mg/dL (ref 8–23)
CO2: 29 mmol/L (ref 22–32)
Calcium: 9.9 mg/dL (ref 8.9–10.3)
Chloride: 114 mmol/L — ABNORMAL HIGH (ref 98–111)
Creatinine, Ser: 0.88 mg/dL (ref 0.44–1.00)
GFR, Estimated: >60 mL/min
Glucose, Bld: 175 mg/dL — ABNORMAL HIGH (ref 70–99)
Potassium: 3.2 mmol/L — ABNORMAL LOW (ref 3.5–5.1)
Sodium: 152 mmol/L — ABNORMAL HIGH (ref 135–145)

## 2023-12-31 LAB — I-STAT VENOUS BLOOD GAS, ED
Acid-Base Excess: 5 mmol/L — ABNORMAL HIGH (ref 0.0–2.0)
Bicarbonate: 29.6 mmol/L — ABNORMAL HIGH (ref 20.0–28.0)
Calcium, Ion: 1.25 mmol/L (ref 1.15–1.40)
HCT: 37 % (ref 36.0–46.0)
Hemoglobin: 12.6 g/dL (ref 12.0–15.0)
O2 Saturation: 67 %
Potassium: 3.7 mmol/L (ref 3.5–5.1)
Sodium: 151 mmol/L — ABNORMAL HIGH (ref 135–145)
TCO2: 31 mmol/L (ref 22–32)
pCO2, Ven: 41.5 mmHg — ABNORMAL LOW (ref 44–60)
pH, Ven: 7.461 — ABNORMAL HIGH (ref 7.25–7.43)
pO2, Ven: 33 mmHg (ref 32–45)

## 2023-12-31 LAB — PROTIME-INR
INR: 1 (ref 0.8–1.2)
Prothrombin Time: 13.9 s (ref 11.4–15.2)

## 2023-12-31 LAB — COMPREHENSIVE METABOLIC PANEL WITH GFR
ALT: 12 U/L (ref 0–44)
AST: 14 U/L — ABNORMAL LOW (ref 15–41)
Albumin: 4.1 g/dL (ref 3.5–5.0)
Alkaline Phosphatase: 81 U/L (ref 38–126)
Anion gap: 10 (ref 5–15)
BUN: 26 mg/dL — ABNORMAL HIGH (ref 8–23)
CO2: 30 mmol/L (ref 22–32)
Calcium: 10.4 mg/dL — ABNORMAL HIGH (ref 8.9–10.3)
Chloride: 111 mmol/L (ref 98–111)
Creatinine, Ser: 1.05 mg/dL — ABNORMAL HIGH (ref 0.44–1.00)
GFR, Estimated: 51 mL/min — ABNORMAL LOW
Glucose, Bld: 347 mg/dL — ABNORMAL HIGH (ref 70–99)
Potassium: 3.7 mmol/L (ref 3.5–5.1)
Sodium: 151 mmol/L — ABNORMAL HIGH (ref 135–145)
Total Bilirubin: 0.6 mg/dL (ref 0.0–1.2)
Total Protein: 7.2 g/dL (ref 6.5–8.1)

## 2023-12-31 LAB — CBG MONITORING, ED
Glucose-Capillary: 293 mg/dL — ABNORMAL HIGH (ref 70–99)
Glucose-Capillary: 171 mg/dL — ABNORMAL HIGH (ref 70–99)
Glucose-Capillary: 206 mg/dL — ABNORMAL HIGH (ref 70–99)
Glucose-Capillary: 105 mg/dL — ABNORMAL HIGH (ref 70–99)

## 2023-12-31 LAB — HEMOGLOBIN A1C
Hgb A1c MFr Bld: 7.6 % — ABNORMAL HIGH (ref 4.8–5.6)
Mean Plasma Glucose: 171.42 mg/dL

## 2023-12-31 LAB — BETA-HYDROXYBUTYRIC ACID: Beta-Hydroxybutyric Acid: 0.26 mmol/L (ref 0.05–0.27)

## 2023-12-31 MED ORDER — ACETAMINOPHEN 650 MG RE SUPP: 650.0000 mg | Freq: Four times a day (QID) | RECTAL | Status: DC | PRN

## 2023-12-31 MED ORDER — INSULIN ASPART 100 UNIT/ML IJ SOLN
0.0000 [IU] | Freq: Three times a day (TID) | INTRAMUSCULAR | Status: DC
  Administered 2023-12-31: 3 [IU] via SUBCUTANEOUS
  Administered 2024-01-01: 5 [IU] via SUBCUTANEOUS
  Administered 2024-01-01: 3 [IU] via SUBCUTANEOUS
  Administered 2024-01-01: 2 [IU] via SUBCUTANEOUS
  Administered 2024-01-02: 3 [IU] via SUBCUTANEOUS
  Administered 2024-01-02: 9 [IU] via SUBCUTANEOUS
  Administered 2024-01-02: 5 [IU] via SUBCUTANEOUS
  Filled 2023-12-31: qty 9
  Filled 2023-12-31: qty 3
  Filled 2023-12-31: qty 5
  Filled 2023-12-31: qty 3
  Filled 2023-12-31: qty 2
  Filled 2023-12-31: qty 3

## 2023-12-31 MED ORDER — DEXTROSE 5 % IV SOLN: INTRAVENOUS | Status: AC

## 2023-12-31 MED ORDER — ENOXAPARIN SODIUM 40 MG/0.4ML IJ SOSY
40.0000 mg | PREFILLED_SYRINGE | INTRAMUSCULAR | Status: DC
  Administered 2023-12-31 – 2024-01-03 (×4): 40 mg via SUBCUTANEOUS
  Filled 2023-12-31 (×4): qty 0.4

## 2023-12-31 MED ORDER — SODIUM CHLORIDE 0.9 % IV BOLUS
500.0000 mL | Freq: Once | INTRAVENOUS | Status: AC
  Administered 2023-12-31: 500 mL via INTRAVENOUS

## 2023-12-31 MED ORDER — ACETAMINOPHEN 325 MG PO TABS: 650.0000 mg | ORAL_TABLET | Freq: Four times a day (QID) | ORAL | Status: DC | PRN

## 2023-12-31 MED ORDER — SODIUM CHLORIDE 0.9% FLUSH: 3.0000 mL | Freq: Two times a day (BID) | INTRAVENOUS | Status: DC

## 2023-12-31 MED ORDER — LABETALOL HCL 5 MG/ML IV SOLN
5.0000 mg | INTRAVENOUS | Status: DC | PRN
  Administered 2024-01-01 – 2024-01-02 (×3): 5 mg via INTRAVENOUS
  Filled 2023-12-31 (×4): qty 4

## 2023-12-31 MED ORDER — INSULIN ASPART 100 UNIT/ML IJ SOLN
0.0000 [IU] | Freq: Every day | INTRAMUSCULAR | Status: DC
  Administered 2024-01-01: 3 [IU] via SUBCUTANEOUS
  Filled 2023-12-31 (×2): qty 3

## 2023-12-31 MED ORDER — POLYETHYLENE GLYCOL 3350 17 G PO PACK: 17.0000 g | PACK | Freq: Every day | ORAL | Status: DC | PRN

## 2023-12-31 NOTE — H&P (Signed)
 " History and Physical   Allison [name] FMW:[id number] DOB: 1934-03-10 DOA: 12/31/2023  PCP: [name], DO   Patient coming from: [facility]  Chief Complaint: Bruising, altered mental status  HPI: Allison [name] is a 89 y.o. female with medical history significant of hypertension, hyperlipidemia, diabetes, GERD, CVA, PAD, hypothyroidism, diastolic CHF, dementia, depression, anxiety, neuropathy presenting with bruising and altered mental status.  Patient was seen on 12/15 for a fall.  She reportedly fell out of her wheelchair face first and hit her face.  There was noted to be a hematoma over her left eye.  Workup was reassuring and occluding CT head and patient was discharged.  Family has noticed increased bruising over her left eye since that time.  Also report patient has had some decrease responsiveness/interactivity as well as decreased ability to follow commands but otherwise at baseline.    Denies fevers, chills, chest pain, shortness of breath, abdominal pain, constipation, diarrhea, nausea, vomiting.  ED Course: Vital signs in the ED notable for blood pressure in the 180s-200 systolic.  Lab workup included CMP with sodium 151 which corrects to 155-157 in the setting of glucose of 347, BUN 26, creatinine stable 1.05, calcium  10.4.  CBC within normal limits.  Urinalysis pending.  Hydroxybutyric acid normal.  VBG with pH 7.46 and pCO2 41.5.  CT head showed no acute abnormality.    Patient ordered a total of 1 L NS in the ED.   Review of Systems: As per HPI otherwise all other systems reviewed and are negative.  Reviewed on admission  Past Medical History:  Diagnosis Date   Acute ischemic stroke (HCC) 10/29/2015   Acute metabolic encephalopathy 08/24/2022   Carpal tunnel syndrome    CVA (cerebral vascular accident) (HCC) 10/28/2015   Dementia (HCC)    Diabetes mellitus without complication (HCC)    Diabetic ketoacidosis (HCC) 08/16/2022   Dysphagia     GERD (gastroesophageal reflux disease)    Hiatal hernia    HLD (hyperlipidemia)    Hx of adenomatous colonic polyps 06/19/2014   Hyperlipidemia    Hypertension    Hypothyroidism    Ischemic stroke (HCC)    Onychomycosis 03/05/2012   Pain in lower limb 04/18/2013   Sciatic nerve pain    Sepsis (HCC) 11/22/2022   Sepsis secondary to UTI (HCC) 08/24/2022   Stroke-like symptoms 10/28/2015   Vertigo     Past Surgical History:  Procedure Laterality Date   BLADDER SURGERY     Mesh implant   COLONOSCOPY     LARYNX SURGERY     vocal cords   PARTIAL HYSTERECTOMY  1978   VESICOVAGINAL FISTULA CLOSURE W/ TAH  1976    Social History  reports that she has never smoked. She has never used smokeless tobacco. She reports that she does not drink alcohol and does not use drugs.  Allergies[1]  Family History  Problem Relation Age of Onset   Alzheimer's disease Mother    Glaucoma Mother    Heart disease Father    Alzheimer's disease Father    Asthma Sister    Allergies Sister    Allergies Sister    Sleep apnea Sister    Heart disease Sister    Breast cancer Maternal Aunt    Pancreatic cancer Maternal Aunt    Prostate cancer Maternal Uncle    Prostate cancer Paternal Uncle    Alcoholism Maternal Uncle    Kidney disease Maternal Aunt    Heart disease Maternal  Aunt   Reviewed on admission  Prior to Admission medications  Medication Sig Start Date End Date Taking? Authorizing Provider  acetaminophen  (TYLENOL ) 500 MG tablet Take 500 mg by mouth daily as needed for mild pain.    [provider]  atorvastatin  (LIPITOR ) 40 MG tablet Take 1 tablet (40 mg total) by mouth daily. 08/18/22   Gonfa, Taye T, MD  azithromycin  (ZITHROMAX ) 500 MG tablet Take 1 tablet (500 mg total) by mouth at bedtime. 11/25/22   Lue Elsie BROCKS, MD  bisacodyl  (DULCOLAX) 10 MG suppository Place 10 mg rectally daily as needed for moderate constipation.    [provider]  Blood Glucose  Monitoring Suppl DEVI 1 each by Does not apply route in the morning, at noon, and at bedtime. May substitute to any manufacturer covered by patient's insurance. 08/18/22   Gonfa, Taye T, MD  calcium  carbonate (TUMS EX) 750 MG chewable tablet Chew 2 tablets by mouth daily as needed (indigestion).    [provider]  diclofenac  Sodium (VOLTAREN  ARTHRITIS PAIN) 1 % GEL Apply 2 g topically daily as needed (for pain).    [provider]  insulin  aspart protamine - aspart (NOVOLOG  70/30 MIX) (70-30) 100 UNIT/ML FlexPen Inject 10 Units into the skin 2 (two) times daily with a meal. 08/31/22   Drusilla Sabas RAMAN, MD  Insulin  Pen Needle (PEN NEEDLES 3/16) 31G X 5 MM MISC 1 Pen by Does not apply route in the morning and at bedtime. 08/18/22   Gonfa, Taye T, MD  levothyroxine  (SYNTHROID ) 125 MCG tablet Take 1 tablet (125 mcg total) by mouth daily before breakfast. 08/18/22   Gonfa, Taye T, MD  magnesium  hydroxide (MILK OF MAGNESIA) 400 MG/5ML suspension Take 30 mLs by mouth daily as needed for mild constipation.    [provider]  melatonin 5 MG TABS Take 5 mg by mouth at bedtime.    [provider]  mirtazapine  (REMERON ) 7.5 MG tablet Take 7.5 mg by mouth at bedtime.    [provider]  pantoprazole  (PROTONIX ) 20 MG tablet Take 1 tablet (20 mg total) by mouth daily. 01/10/22   Suzette Pac, MD    Physical Exam: Vitals:   12/31/23 1115 12/31/23 1215 12/31/23 1300 12/31/23 1400  BP: (!) 193/96 (!) 191/94 (!) 187/92 (!) 205/90  Pulse: 98 91 91   Resp: 15 19 12 13   Temp:      TempSrc:      SpO2: 100% 100% 100%     Physical Exam Constitutional:      General: She is not in acute distress.    Appearance: Normal appearance.  HENT:     Head: Normocephalic and atraumatic.     Mouth/Throat:     Mouth: Mucous membranes are moist.     Pharynx: Oropharynx is clear.  Eyes:     Extraocular Movements: Extraocular movements intact.     Pupils: Pupils are equal, round, and  reactive to light.  Cardiovascular:     Rate and Rhythm: Normal rate and regular rhythm.     Pulses: Normal pulses.     Heart sounds: Normal heart sounds.  Pulmonary:     Effort: Pulmonary effort is normal. No respiratory distress.     Breath sounds: Normal breath sounds.  Abdominal:     General: Bowel sounds are normal. There is no distension.     Palpations: Abdomen is soft.     Tenderness: There is no abdominal tenderness.  Musculoskeletal:  General: No swelling or deformity.  Skin:    General: Skin is warm and dry.  Neurological:     General: No focal deficit present.     Mental Status: Mental status is at baseline.     Comments: Pleasantly demented    Labs on Admission: I have personally reviewed following labs and imaging studies  CBC: Recent Labs  Lab 12/27/23 1828 12/31/23 1115 12/31/23 1122  WBC 7.6 9.8  --   NEUTROABS 4.8 6.9  --   HGB 12.2 12.3 12.6  HCT 38.3 37.7 37.0  MCV 81.1 82.3  --   PLT 266 263  --     Basic Metabolic Panel: Recent Labs  Lab 12/27/23 1828 12/31/23 1115 12/31/23 1122  NA 137 151* 151*  K 4.0 3.7 3.7  CL 98 111  --   CO2 31 30  --   GLUCOSE 379* 347*  --   BUN 19 26*  --   CREATININE 1.00 1.05*  --   CALCIUM  9.4 10.4*  --     GFR: CrCl cannot be calculated (Unknown ideal weight.).  Liver Function Tests: Recent Labs  Lab 12/27/23 1828 12/31/23 1115  AST 20 14*  ALT 16 12  ALKPHOS 67 81  BILITOT 0.5 0.6  PROT 6.8 7.2  ALBUMIN 3.4* 4.1    Urine analysis:    Component Value Date/Time   COLORURINE YELLOW 03/28/2023 2220   APPEARANCEUR TURBID (A) 03/28/2023 2220   LABSPEC 1.013 03/28/2023 2220   PHURINE 5.0 03/28/2023 2220   GLUCOSEU 150 (A) 03/28/2023 2220   HGBUR SMALL (A) 03/28/2023 2220   BILIRUBINUR NEGATIVE 03/28/2023 2220   KETONESUR NEGATIVE 03/28/2023 2220   PROTEINUR 100 (A) 03/28/2023 2220   UROBILINOGEN 0.2 03/29/2014 1055   NITRITE NEGATIVE 03/28/2023 2220   LEUKOCYTESUR MODERATE (A)  03/28/2023 2220    Radiological Exams on Admission: CT Head Wo Contrast Result Date: 12/31/2023 EXAM: CT HEAD WITHOUT CONTRAST 12/31/2023 01:21:48 PM TECHNIQUE: CT of the head was performed without the administration of intravenous contrast. Automated exposure control, iterative reconstruction, and/or weight based adjustment of the mA/kV was utilized to reduce the radiation dose to as low as reasonably achievable. COMPARISON: 12/27/2023 CLINICAL HISTORY: Mental status change, unknown cause. FINDINGS: BRAIN AND VENTRICLES: No acute hemorrhage. No evidence of acute infarct. Chronic left basal ganglia infarct. Proportional prominence of ventricles and sulci, consistent with diffuse cerebral parenchymal volume loss. Scattered periventricular and subcortical white matter hypoattenuation, consistent with mild chronic ischemic microvascular disease. No hydrocephalus. No extra-axial collection. No mass effect or midline shift. ORBITS: No acute abnormality. SINUSES: No acute abnormality. SOFT TISSUES AND SKULL: Small left frontal scalp hematoma. No acute fracture or radiopaque foreign body is associated. VASCULATURE: Calcified atherosclerotic plaque within cavernous/supraclinoid internal carotid arteries. IMPRESSION: 1. No acute intracranial abnormality. 2. Small left frontal scalp hematoma without associated acute fracture or radiopaque foreign body. 3. Diffuse cerebral parenchymal volume loss. 4. Mild chronic ischemic microvascular disease. 5. Chronic left basal ganglia infarct. Electronically signed by: Lonni Necessary MD 12/31/2023 01:28 PM EST RP Workstation: [id number]   EKG: Independently reviewed.  Sinus rhythm, nonspecific T wave changes.  Artifact/wander during the middle of EKG capture interfering with interpretation.   Assessment/Plan Active Problems:   Hypernatremia   Alzheimer's dementia (HCC)   Acute encephalopathy   Hypothyroidism   Dyslipidemia   Essential hypertension   GERD   PVD  (peripheral vascular disease) (HCC)   Neuropathic pain   Uncontrolled type 2 diabetes mellitus with hyperglycemia, without long-term  current use of insulin  (HCC)   Anxiety state   History of stroke   Depression   Encephalopathy Hyponatremia > Patient noted to be somewhat decreased in her reactivity and responsiveness and ability to follow commands today. > Noted to have sodium of 151 that corrects to higher around 156 when you come for glucose of 347.  Has had this in the past which was presumed to be due to decreased p.o. intake. > Has received 500 cc normal saline in the ED.  Calculated to have approximately 2.5 L free water deficit. - Monitor in progressive overnight - Trend BMP every 6 hours - Start D5W with close monitoring of glucose after completion of normal saline in the ED - Supportive care  Bruising Recent fall > Left sided black eye noted by family.  This was not present right after her recent fall.  But patient did have hematoma in that area after landing on her face.  Suspect this is evolution of her bruising following her recent fall. > Repeat CT head showed no acute normality. - Continue to monitor - Will check PT and INR  Diabetes Hyperglycemia > Will work to confirm home regimen, SSI for now. > Allergy listed to Levemir  and Lantus  in chart.  Chart review shows patient tolerated NovoLog  as recently as March and has been on NovoLog  70/30 mix in the past. - SSI with NovoLog   Hypertension > BP 180s-200 in ED - Will confirm medication regimen - As needed labetalol  for now  Hyperlipidemia GERD History of CVA PAD Hypothyroidism - Working to confirm regimen, awaiting information from facility  Dementia - Noted  Depression Anxiety - Working to confirm home regimen  Chronic diastolic CHF > Echo in 2017 showed EF 55-6%, G1 DD, normal RV function. - Not appear to be on diuretics.  DVT prophylaxis: Lovenox   Code Status:   Full Family Communication:  None on  admission, family updated was by previous provider per chart.  Disposition Plan:   Patient is from:  [facility]  Anticipated DC to:  Same as above  Anticipated DC date:  1 to 3 days  Anticipated DC barriers: None  Consults called:  None Admission status:  Observation, progressive  Severity of Illness: The appropriate patient status for this patient is OBSERVATION. Observation status is judged to be reasonable and necessary in order to provide the required intensity of service to ensure the patient's safety. The patient's presenting symptoms, physical exam findings, and initial radiographic and laboratory data in the context of their medical condition is felt to place them at decreased risk for further clinical deterioration. Furthermore, it is anticipated that the patient will be medically stable for discharge from the hospital within 2 midnights of admission.    Marsa KATHEE Scurry MD [facility]  How to contact the [facility] Attending or Consulting provider 7A - 7P or covering provider during after hours 7P -7A, for this patient?   Check the care team in [facility] and look for a) attending/consulting [facility] provider listed and b) the [facility] team listed Log into [email or web address] and use [facility] universal password to access. If you do not have the password, please contact the hospital operator. Locate the [facility] provider you are looking for under [facility] and page to a number that you can be directly reached. If you still have difficulty reaching the provider, please page the [facility] (Director on Call) for the Hospitalists listed on amion for assistance.  12/31/2023, 2:47 PM       [1]  Allergies Allergen Reactions   Lantus  [Insulin  Glargine] Itching and Swelling    angioedema   Levemir  [Insulin  Detemir] Itching and Swelling    angioedema   Pineapple Anaphylaxis    Not listed on the [facility]   Codeine Other (See Comments)    Not listed on the [facility]   Invokana [Canagliflozin] Other (See  Comments)    Constipation, nightmares Not listed on the [facility]   Penicillins Other (See Comments)    Unknown reaction per MAR    Sulfa Antibiotics Other (See Comments)    Unknown reaction per MAR **Sulfasalazine included on MAR**   Tradjenta  [Linagliptin ] Other (See Comments)    Not listed on the [facility]   Vioxx [Rofecoxib] Other (See Comments)    Not listed on the [facility]   Exelon [Rivastigmine] Rash   "

## 2023-12-31 NOTE — ED Provider Notes (Signed)
 " [facility] Provider Note   CSN: [id number] Arrival date & time: 12/31/23  1053     Patient presents with: Altered Mental Status   Allison [name] is a 89 y.o. female patient with past medical history of CVA, DM on insulin , HTN, HLD, dementia is presenting to ER. Facility reports that they have noted worsening bruising to like left eye since she was last seen here 4 days ago. She did have a fall 4 days ago and had CT head at that time showing no acute intracranial abnormalities. Not on BT. With EMS she was found to have hyperglycemia of 480. Patient has no complaints, unable to provide history to due dementia. Facility reports that she is not talking as much as normal and not following basic commands as well, but EMS tells us  she is otherwise at her baseline.      Altered Mental Status      Prior to Admission medications  Medication Sig Start Date End Date Taking? Authorizing Provider  acetaminophen  (TYLENOL ) 500 MG tablet Take 500 mg by mouth daily as needed for mild pain.    [provider]  atorvastatin  (LIPITOR ) 40 MG tablet Take 1 tablet (40 mg total) by mouth daily. 08/18/22   Gonfa, Taye T, MD  azithromycin  (ZITHROMAX ) 500 MG tablet Take 1 tablet (500 mg total) by mouth at bedtime. 11/25/22   Lue Elsie BROCKS, MD  bisacodyl  (DULCOLAX) 10 MG suppository Place 10 mg rectally daily as needed for moderate constipation.    [provider]  Blood Glucose Monitoring Suppl DEVI 1 each by Does not apply route in the morning, at noon, and at bedtime. May substitute to any manufacturer covered by patient's insurance. 08/18/22   Gonfa, Taye T, MD  calcium  carbonate (TUMS EX) 750 MG chewable tablet Chew 2 tablets by mouth daily as needed (indigestion).    [provider]  diclofenac  Sodium (VOLTAREN  ARTHRITIS PAIN) 1 % GEL Apply 2 g topically daily as needed (for pain).    [provider]  insulin  aspart  protamine - aspart (NOVOLOG  70/30 MIX) (70-30) 100 UNIT/ML FlexPen Inject 10 Units into the skin 2 (two) times daily with a meal. 08/31/22   Drusilla Sabas RAMAN, MD  Insulin  Pen Needle (PEN NEEDLES 3/16) 31G X 5 MM MISC 1 Pen by Does not apply route in the morning and at bedtime. 08/18/22   Gonfa, Taye T, MD  levothyroxine  (SYNTHROID ) 125 MCG tablet Take 1 tablet (125 mcg total) by mouth daily before breakfast. 08/18/22   Gonfa, Taye T, MD  magnesium  hydroxide (MILK OF MAGNESIA) 400 MG/5ML suspension Take 30 mLs by mouth daily as needed for mild constipation.    [provider]  melatonin 5 MG TABS Take 5 mg by mouth at bedtime.    [provider]  mirtazapine  (REMERON ) 7.5 MG tablet Take 7.5 mg by mouth at bedtime.    [provider]  pantoprazole  (PROTONIX ) 20 MG tablet Take 1 tablet (20 mg total) by mouth daily. 01/10/22   Suzette Pac, MD    Allergies: Lantus  [insulin  glargine], Levemir  [insulin  detemir], Pineapple, Codeine, Invokana [canagliflozin], Penicillins, Sulfa antibiotics, Tradjenta  [linagliptin ], Vioxx [rofecoxib], and Exelon [rivastigmine]    Review of Systems  Skin:  Positive for wound.    Updated Vital Signs BP (!) 191/94   Pulse 91   Temp 97.6 F (36.4 C) (Oral)   Resp 19   SpO2 100%   Physical Exam Vitals and nursing  note reviewed.  Constitutional:      General: She is not in acute distress.    Appearance: She is not toxic-appearing.  HENT:     Head: Normocephalic and atraumatic.  Eyes:     General: No scleral icterus.    Conjunctiva/sclera: Conjunctivae normal.  Cardiovascular:     Rate and Rhythm: Normal rate and regular rhythm.     Pulses: Normal pulses.     Heart sounds: Normal heart sounds.  Pulmonary:     Effort: Pulmonary effort is normal. No respiratory distress.     Breath sounds: Normal breath sounds.  Abdominal:     General: Abdomen is flat. Bowel sounds are normal.     Palpations: Abdomen is soft.     Tenderness: There is no  abdominal tenderness.  Skin:    General: Skin is warm and dry.     Findings: No lesion.  Neurological:     General: No focal deficit present.     Mental Status: She is alert and oriented to person, place, and time. Mental status is at baseline.     Comments: Following basic commands, knows name.  Hematoma over left eye, bruising over left eye.      (all labs ordered are listed, but only abnormal results are displayed) Labs Reviewed  CBC WITH DIFFERENTIAL/PLATELET - Abnormal; Notable for the following components:      Result Value   RDW 15.9 (*)    All other components within normal limits  COMPREHENSIVE METABOLIC PANEL WITH GFR - Abnormal; Notable for the following components:   Sodium 151 (*)    Glucose, Bld 347 (*)    BUN 26 (*)    Creatinine, Ser 1.05 (*)    Calcium  10.4 (*)    AST 14 (*)    GFR, Estimated 51 (*)    All other components within normal limits  CBG MONITORING, ED - Abnormal; Notable for the following components:   Glucose-Capillary 293 (*)    All other components within normal limits  I-STAT VENOUS BLOOD GAS, ED - Abnormal; Notable for the following components:   pH, Ven 7.461 (*)    pCO2, Ven 41.5 (*)    Bicarbonate 29.6 (*)    Acid-Base Excess 5.0 (*)    Sodium 151 (*)    All other components within normal limits  BETA-HYDROXYBUTYRIC ACID  URINALYSIS, ROUTINE W REFLEX MICROSCOPIC    EKG: EKG Interpretation Date/Time:  Friday December 31 2023 11:01:20 EST Ventricular Rate:  97 PR Interval:  195 QRS Duration:  81 QT Interval:  348 QTC Calculation: 445 R Axis:   60  Text Interpretation: Sinus rhythm Ventricular premature complex Consider left atrial enlargement Nonspecific repol abnormality, lateral leads No significant change since last tracing Confirmed by Dean Clarity [phone]) on 12/31/2023 11:04:03 AM  Radiology: No results found.   Procedures   Medications Ordered in the ED  sodium chloride  0.9 % bolus 500 mL (has no administration in  time range)                                    Medical Decision Making Amount and/or Complexity of Data Reviewed Labs: ordered. Radiology: ordered.  Risk Decision regarding hospitalization.   This patient presents to the ED for concern of confusion/left eye bruising, this involves an extensive number of treatment options, and is a complaint that carries with it a high risk of complications and morbidity.  The differential diagnosis includes CVA, intercranial hemorrhage, electrolyte abnormality, dehydration, urinary tract infection   Co morbidities that complicate the patient evaluation  CVA, DM on insulin , HTN, HLD, dementia   Additional history obtained:  Additional history obtained from patient had recent head injury 12/27/2023 and had normal intracranial imaging at this time.   Lab Tests:  I personally interpreted labs.  The pertinent results include:   CBC is unremarkable, CMP is remarkable for sodium of 151, glucose is elevated at 347 but no anion gap.  VBG shows no acidosis.   Imaging Studies ordered:  I ordered imaging studies including head CT  I independently visualized and interpreted imaging which showed no acute findings.  I agree with the radiologist interpretation   Cardiac Monitoring: / EKG:  The patient was maintained on a cardiac monitor.  I personally viewed and interpreted the cardiac monitored which showed an underlying rhythm of: Sinus with PVC no significant change since prior imaging   Consultations Obtained:  I requested consultation with the hospital team for admission,  and discussed lab and imaging findings as well as pertinent plan. Hospitalist has accepted for admission.    Problem List / ED Course / Critical interventions / Medication management  Patient presents to emergency room with altered mental status, hyperglycemia and bruising over left eye.  My exam she is able to follow basic commands.  She does not seem to have any focal  deficit.  She is aware that report that was yesterday she does not know why she is here or have any additional concerns. Vitals stable, but is hypertensive.  Patient has hyperglycemia without evidence of DKA.  I did give her 1 L of normal saline this nice improve BG from initally 347 seen on CMP to 293.  She was also found to be hypernatremic which is suspected secondary to volume loss/dehydration. Patient has a hematoma noted on her CT scan 4 days ago but apparently bruising has gotten worse.  This is likely secondary to gravity but given increased confusion I did repeat head CT which shows no acute intercranial abnormality. No known new injury/fall. I ordered medication including 1L NS Reevaluation of the patient after these medicines showed that the patient stayed the same I have reviewed the patients home medicines and have made adjustments as needed. Given natremia, confusion feel patient would benefit from admission.        Final diagnoses:  Hypernatremia  Hyperglycemia  Altered mental status, unspecified altered mental status type    ED Discharge Orders     None          Shermon Warren SAILOR, PA-C 12/31/23 1438  "

## 2023-12-31 NOTE — ED Triage Notes (Addendum)
 Pt from [facility] c/o an increase in altered mental status and a new black eye on the L side. The facility is unsure how she sustained this. Pt is not on any blood thinners. Pt interactive and vitally stable with EMS. She is altered at baseline per the facility.   BGL 482

## 2024-01-01 ENCOUNTER — Encounter (HOSPITAL_COMMUNITY): Payer: Self-pay | Admitting: Internal Medicine

## 2024-01-01 DIAGNOSIS — F411 Generalized anxiety disorder: Secondary | ICD-10-CM | POA: Diagnosis present

## 2024-01-01 DIAGNOSIS — Z8249 Family history of ischemic heart disease and other diseases of the circulatory system: Secondary | ICD-10-CM | POA: Diagnosis not present

## 2024-01-01 DIAGNOSIS — E1165 Type 2 diabetes mellitus with hyperglycemia: Secondary | ICD-10-CM | POA: Diagnosis present

## 2024-01-01 DIAGNOSIS — G9341 Metabolic encephalopathy: Secondary | ICD-10-CM | POA: Diagnosis present

## 2024-01-01 DIAGNOSIS — Z66 Do not resuscitate: Secondary | ICD-10-CM | POA: Diagnosis present

## 2024-01-01 DIAGNOSIS — E860 Dehydration: Secondary | ICD-10-CM | POA: Diagnosis present

## 2024-01-01 DIAGNOSIS — E871 Hypo-osmolality and hyponatremia: Secondary | ICD-10-CM | POA: Diagnosis present

## 2024-01-01 DIAGNOSIS — F32A Depression, unspecified: Secondary | ICD-10-CM | POA: Diagnosis present

## 2024-01-01 DIAGNOSIS — Z794 Long term (current) use of insulin: Secondary | ICD-10-CM | POA: Diagnosis not present

## 2024-01-01 DIAGNOSIS — W050XXA Fall from non-moving wheelchair, initial encounter: Secondary | ICD-10-CM | POA: Diagnosis present

## 2024-01-01 DIAGNOSIS — I110 Hypertensive heart disease with heart failure: Secondary | ICD-10-CM | POA: Diagnosis present

## 2024-01-01 DIAGNOSIS — F0283 Dementia in other diseases classified elsewhere, unspecified severity, with mood disturbance: Secondary | ICD-10-CM | POA: Diagnosis present

## 2024-01-01 DIAGNOSIS — R4182 Altered mental status, unspecified: Secondary | ICD-10-CM | POA: Diagnosis present

## 2024-01-01 DIAGNOSIS — Z7982 Long term (current) use of aspirin: Secondary | ICD-10-CM | POA: Diagnosis not present

## 2024-01-01 DIAGNOSIS — G309 Alzheimer's disease, unspecified: Secondary | ICD-10-CM | POA: Diagnosis present

## 2024-01-01 DIAGNOSIS — E039 Hypothyroidism, unspecified: Secondary | ICD-10-CM | POA: Diagnosis present

## 2024-01-01 DIAGNOSIS — I5032 Chronic diastolic (congestive) heart failure: Secondary | ICD-10-CM | POA: Diagnosis present

## 2024-01-01 DIAGNOSIS — E1151 Type 2 diabetes mellitus with diabetic peripheral angiopathy without gangrene: Secondary | ICD-10-CM | POA: Diagnosis present

## 2024-01-01 DIAGNOSIS — E785 Hyperlipidemia, unspecified: Secondary | ICD-10-CM | POA: Diagnosis present

## 2024-01-01 DIAGNOSIS — K219 Gastro-esophageal reflux disease without esophagitis: Secondary | ICD-10-CM | POA: Diagnosis present

## 2024-01-01 DIAGNOSIS — E1140 Type 2 diabetes mellitus with diabetic neuropathy, unspecified: Secondary | ICD-10-CM | POA: Diagnosis present

## 2024-01-01 DIAGNOSIS — Z79899 Other long term (current) drug therapy: Secondary | ICD-10-CM | POA: Diagnosis not present

## 2024-01-01 DIAGNOSIS — Z880 Allergy status to penicillin: Secondary | ICD-10-CM | POA: Diagnosis not present

## 2024-01-01 DIAGNOSIS — E870 Hyperosmolality and hypernatremia: Secondary | ICD-10-CM | POA: Diagnosis present

## 2024-01-01 DIAGNOSIS — Z79890 Hormone replacement therapy (postmenopausal): Secondary | ICD-10-CM | POA: Diagnosis not present

## 2024-01-01 DIAGNOSIS — I69391 Dysphagia following cerebral infarction: Secondary | ICD-10-CM | POA: Diagnosis not present

## 2024-01-01 LAB — BASIC METABOLIC PANEL WITH GFR
Anion gap: 12 (ref 5–15)
Anion gap: 12 (ref 5–15)
BUN: 19 mg/dL (ref 8–23)
BUN: 21 mg/dL (ref 8–23)
CO2: 27 mmol/L (ref 22–32)
CO2: 26 mmol/L (ref 22–32)
Calcium: 9.6 mg/dL (ref 8.9–10.3)
Calcium: 9.4 mg/dL (ref 8.9–10.3)
Chloride: 114 mmol/L — ABNORMAL HIGH (ref 98–111)
Chloride: 110 mmol/L (ref 98–111)
Creatinine, Ser: 0.71 mg/dL (ref 0.44–1.00)
Creatinine, Ser: 0.85 mg/dL (ref 0.44–1.00)
GFR, Estimated: >60 mL/min
GFR, Estimated: >60 mL/min
Glucose, Bld: 125 mg/dL — ABNORMAL HIGH (ref 70–99)
Glucose, Bld: 247 mg/dL — ABNORMAL HIGH (ref 70–99)
Potassium: 3.1 mmol/L — ABNORMAL LOW (ref 3.5–5.1)
Potassium: 4.1 mmol/L (ref 3.5–5.1)
Sodium: 152 mmol/L — ABNORMAL HIGH (ref 135–145)
Sodium: 147 mmol/L — ABNORMAL HIGH (ref 135–145)

## 2024-01-01 LAB — CBC
HCT: 37.4 % (ref 36.0–46.0)
Hemoglobin: 12 g/dL (ref 12.0–15.0)
MCH: 26.3 pg (ref 26.0–34.0)
MCHC: 32.1 g/dL (ref 30.0–36.0)
MCV: 82 fL (ref 80.0–100.0)
Platelets: 222 K/uL (ref 150–400)
RBC: 4.56 MIL/uL (ref 3.87–5.11)
RDW: 16 % — ABNORMAL HIGH (ref 11.5–15.5)
WBC: 9.6 K/uL (ref 4.0–10.5)
nRBC: 0 % (ref 0.0–0.2)

## 2024-01-01 LAB — GLUCOSE, CAPILLARY
Glucose-Capillary: 240 mg/dL — ABNORMAL HIGH (ref 70–99)
Glucose-Capillary: 297 mg/dL — ABNORMAL HIGH (ref 70–99)
Glucose-Capillary: 286 mg/dL — ABNORMAL HIGH (ref 70–99)

## 2024-01-01 LAB — CBG MONITORING, ED: Glucose-Capillary: 193 mg/dL — ABNORMAL HIGH (ref 70–99)

## 2024-01-01 LAB — OSMOLALITY: Osmolality: 318 mosm/kg — ABNORMAL HIGH (ref 275–295)

## 2024-01-01 LAB — SODIUM: Sodium: 146 mmol/L — ABNORMAL HIGH (ref 135–145)

## 2024-01-01 LAB — MRSA NEXT GEN BY PCR, NASAL: MRSA by PCR Next Gen: NOT DETECTED

## 2024-01-01 MED ORDER — PANTOPRAZOLE SODIUM 20 MG PO TBEC
20.0000 mg | DELAYED_RELEASE_TABLET | Freq: Every day | ORAL | Status: DC
  Administered 2024-01-01 – 2024-01-04 (×4): 20 mg via ORAL
  Filled 2024-01-01 (×4): qty 1

## 2024-01-01 MED ORDER — DEXTROSE 5 % IV SOLN: INTRAVENOUS | Status: DC

## 2024-01-01 MED ORDER — POTASSIUM CHLORIDE 20 MEQ PO PACK
40.0000 meq | PACK | Freq: Once | ORAL | Status: AC
  Administered 2024-01-01: 40 meq via ORAL
  Filled 2024-01-01: qty 2

## 2024-01-01 MED ORDER — LEVOTHYROXINE SODIUM 25 MCG PO TABS
125.0000 ug | ORAL_TABLET | Freq: Every day | ORAL | Status: DC
  Administered 2024-01-02 – 2024-01-04 (×3): 125 ug via ORAL
  Filled 2024-01-01 (×3): qty 1

## 2024-01-01 MED ORDER — MIRTAZAPINE 15 MG PO TABS
7.5000 mg | ORAL_TABLET | Freq: Every day | ORAL | Status: DC
  Administered 2024-01-01 – 2024-01-03 (×3): 7.5 mg via ORAL
  Filled 2024-01-01 (×3): qty 1

## 2024-01-01 MED ORDER — MELATONIN 5 MG PO TABS
5.0000 mg | ORAL_TABLET | Freq: Every day | ORAL | Status: DC
  Administered 2024-01-01 – 2024-01-03 (×3): 5 mg via ORAL
  Filled 2024-01-01 (×3): qty 1

## 2024-01-01 MED ORDER — AMLODIPINE BESYLATE 10 MG PO TABS
10.0000 mg | ORAL_TABLET | Freq: Every day | ORAL | Status: DC
  Administered 2024-01-01 – 2024-01-04 (×4): 10 mg via ORAL
  Filled 2024-01-01: qty 1
  Filled 2024-01-01: qty 2
  Filled 2024-01-01 (×2): qty 1

## 2024-01-01 MED ORDER — ATORVASTATIN CALCIUM 40 MG PO TABS
40.0000 mg | ORAL_TABLET | Freq: Every day | ORAL | Status: DC
  Administered 2024-01-01 – 2024-01-04 (×4): 40 mg via ORAL
  Filled 2024-01-01 (×4): qty 1

## 2024-01-01 MED ORDER — ASPIRIN 81 MG PO CHEW
81.0000 mg | CHEWABLE_TABLET | Freq: Every day | ORAL | Status: DC
  Administered 2024-01-01 – 2024-01-04 (×4): 81 mg via ORAL
  Filled 2024-01-01 (×4): qty 1

## 2024-01-01 NOTE — Evaluation (Signed)
 Physical Therapy Evaluation Patient Details Name: Allison [name] MRN: [id number] DOB: 26-Aug-1934 Today's Date: 01/01/2024  History of Present Illness  Allison [name] is a 89 y.o. F who presented to [facility] ED 12/31/23 with bruising and AMS. CT head showed no acute abnormality. Pt noted to be somewhat decreased in her reactivity and responsiveness and ability to follow commands. PMHx: dementia, HTN, HLD, T2DM, hypothyroidism, CVA,  diastolic CHF, GERD, depression, anxiety, and neuropathy. Of note, recently seen 12/15 for fall out of her w/c where she hit her face first and had a 3 cm hematoma over her left eye.   Clinical Impression  Pt admitted with above diagnosis. Examination limited by lethargy, impaired cognition secondary to dementia, pt being an unreliable historian, and lack of family/caregiver present. Per Chart Review, pt resides at [facility]. Unsure of her PLOF, anticipate pt requires significant assistance with functional mobility, ADLs, and IADLs. She recently fell out her w/c requiring a hospital visit d/t hitting her face earlier this week. Pt currently with functional limitations due to the deficits listed below (see PT Problem List). She required maxA for bed mobility and maxA x1-2 for sit<>stand. Pt will benefit from acute skilled PT to maximize her independence and safety with mobility to allow discharge. Recommend continued inpatient follow up therapy, <3 hours/day.   01/01/24 0750  Vitals  Patient Position (if appropriate) Orthostatic Vitals  Orthostatic Lying   BP- Lying (!) 186/97  Pulse- Lying 82  Orthostatic Sitting  BP- Sitting 169/81  Pulse- Sitting 79            If plan is discharge home, recommend the following: A lot of help with walking and/or transfers;A lot of help with bathing/dressing/bathroom;Assistance with cooking/housework;Assist for transportation;Help with stairs or ramp for entrance;Supervision due to cognitive status;Direct  supervision/assist for medications management   Can travel by private vehicle   No    Equipment Recommendations Hospital bed;Hoyer lift;Wheelchair (measurements PT);Wheelchair cushion (measurements PT)  Recommendations for Other Services       Functional Status Assessment Patient has had a recent decline in their functional status and/or demonstrates limited ability to make significant improvements in function in a reasonable and predictable amount of time     Precautions / Restrictions Precautions Precautions: Fall Recall of Precautions/Restrictions: Impaired Precaution/Restrictions Comments: watch BP Restrictions Weight Bearing Restrictions Per Provider Order: No      Mobility  Bed Mobility Overal bed mobility: Needs Assistance Bed Mobility: Supine to Sit, Sit to Supine     Supine to sit: Max assist, HOB elevated, Used rails Sit to supine: Max assist   General bed mobility comments: Pt sat up on R side of bed with increased time. No initation noted. Pt slightly participated once PT facilitated movement. Brought BLE off EOB, elevated trunk, and scooted hips fwd til feet flat on floor. Returning to bed guided pt's trunk down and brought BLE back into bed. Repositioned using bed pad and +2 assist.    Transfers Overall transfer level: Needs assistance Equipment used: None, Rolling walker (2 wheels) Transfers: Sit to/from Stand Sit to Stand: From elevated surface, Max assist, +2 physical assistance           General transfer comment: Pt stood from raised bed height. Instructed pt to hold onto posterior upper arm of therapist with minimal grip noted. Powered up to stand with face-to-face approach and bilateral anterior knee block. MaxA, able to fully clear hips but lacking full hip/knee ext and upright posture. Introduced [employer]  and pt held onto bilateral grips, RN entered to assist in stand attempt #2. No improvement noted and pt quickly fatiguing.    Ambulation/Gait                General Gait Details: Unable  Stairs            Wheelchair Mobility     Tilt Bed    Modified Rankin (Stroke Patients Only)       Balance Overall balance assessment: Needs assistance Sitting-balance support: Bilateral upper extremity supported, Feet supported Sitting balance-Leahy Scale: Fair Sitting balance - Comments: Pt sat EOB with close supervision.   Standing balance support: Bilateral upper extremity supported, During functional activity Standing balance-Leahy Scale: Poor Standing balance comment: Pt dependent on maxA x1-2.                             Pertinent Vitals/Pain Pain Assessment Pain Assessment: PAINAD Breathing: normal Negative Vocalization: occasional moan/groan, low speech, negative/disapproving quality Facial Expression: smiling or inexpressive Body Language: tense, distressed pacing, fidgeting Consolability: no need to console PAINAD Score: 2 Pain Intervention(s): Monitored during session    Home Living Family/patient expects to be discharged to:: Skilled nursing facility                   Additional Comments: Per Chart Review, pt from [facility] health and rehab.    Prior Function Prior Level of Function : Patient poor historian/Family not available;Needs assist  Cognitive Assist : Mobility (cognitive);ADLs (cognitive)     Physical Assist : Mobility (physical);ADLs (physical)     Mobility Comments: Anticipate significant assistance with all mobility. Per chart review, pt fell out of her wheelchair 12/15. ADLs Comments: Anticipate significant assistance with all ADLs/IADLs.     Extremity/Trunk Assessment   Upper Extremity Assessment Upper Extremity Assessment: Defer to OT evaluation    Lower Extremity Assessment Lower Extremity Assessment: Difficult to assess due to impaired cognition;Generalized weakness    Cervical / Trunk Assessment Cervical / Trunk Assessment: Kyphotic  Communication    Communication Communication: Impaired Factors Affecting Communication: Hearing impaired    Cognition Arousal: Lethargic Behavior During Therapy: Flat affect   PT - Cognitive impairments: No family/caregiver present to determine baseline, History of cognitive impairments (Dementia)                       PT - Cognition Comments: Pt drowsy, keeping eyes closed throughout the majority of session. She required tactile stimulation to engage. Pt was able to state her name and that she had a birthday recently, but didn't verbalize DOB. Alertness improved upon sitting EOB. Following commands: Impaired Following commands impaired: Follows one step commands inconsistently, Follows one step commands with increased time     Cueing Cueing Techniques: Verbal cues, Gestural cues, Tactile cues     General Comments General comments (skin integrity, edema, etc.): Orthostatic Vitals taken, (+). Pt unable to state if she was symptomatic. Unable to achieve standing readings. 95-98% SpO2 on RA.    Exercises     Assessment/Plan    PT Assessment Patient needs continued PT services  PT Problem List Decreased strength;Decreased activity tolerance;Decreased balance;Decreased mobility;Decreased cognition;Decreased knowledge of use of DME;Decreased safety awareness;Cardiopulmonary status limiting activity       PT Treatment Interventions DME instruction;Functional mobility training;Therapeutic activities;Therapeutic exercise;Balance training;Cognitive remediation;Patient/family education;Wheelchair mobility training    PT Goals (Current goals can be found in the Care Plan section)  Acute  Rehab PT Goals PT Goal Formulation: Patient unable to participate in goal setting Time For Goal Achievement: 01/15/24 Potential to Achieve Goals: Fair    Frequency Min 2X/week     Co-evaluation               AM-PAC PT 6 Clicks Mobility  Outcome Measure Help needed turning from your back to your side  while in a flat bed without using bedrails?: A Lot Help needed moving from lying on your back to sitting on the side of a flat bed without using bedrails?: A Lot Help needed moving to and from a bed to a chair (including a wheelchair)?: Total Help needed standing up from a chair using your arms (e.g., wheelchair or bedside chair)?: Total Help needed to walk in hospital room?: Total Help needed climbing 3-5 steps with a railing? : Total 6 Click Score: 8    End of Session Equipment Utilized During Treatment: Gait belt Activity Tolerance: Patient limited by lethargy;Patient limited by fatigue Patient left: in bed;with call bell/phone within reach;with bed alarm set Nurse Communication: Mobility status;Need for lift equipment PT Visit Diagnosis: Muscle weakness (generalized) (M62.81);Other abnormalities of gait and mobility (R26.89);Unsteadiness on feet (R26.81)    Time: 9253-9195 PT Time Calculation (min) (ACUTE ONLY): 18 min   Charges:   PT Evaluation $PT Eval Moderate Complexity: 1 Mod   PT General Charges $$ ACUTE PT VISIT: 1 Visit         Randall SAUNDERS, PT, DPT Acute Rehabilitation Services Office: [phone] Secure Chat Preferred  Allison [name] 01/01/2024, 9:23 AM

## 2024-01-01 NOTE — ED Notes (Signed)
 RN attempted to place a second IV without success. Pt will not extend her right arm to allow IVF to infuse.

## 2024-01-01 NOTE — Evaluation (Signed)
 Clinical/Bedside Swallow Evaluation Patient Details  Name: Allison [name] MRN: [id number] Date of Birth: 12/03/1934  Today's Date: 01/01/2024 Time: SLP Start Time (ACUTE ONLY): 9182 SLP Stop Time (ACUTE ONLY): 0830 SLP Time Calculation (min) (ACUTE ONLY): 13 min  Past Medical History:  Past Medical History:  Diagnosis Date   Acute ischemic stroke (HCC) 10/29/2015   Acute metabolic encephalopathy 08/24/2022   Carpal tunnel syndrome    CVA (cerebral vascular accident) (HCC) 10/28/2015   Dementia (HCC)    Diabetes mellitus without complication (HCC)    Diabetic ketoacidosis (HCC) 08/16/2022   Dysphagia    GERD (gastroesophageal reflux disease)    Hiatal hernia    HLD (hyperlipidemia)    Hx of adenomatous colonic polyps 06/19/2014   Hyperlipidemia    Hypertension    Hypothyroidism    Ischemic stroke (HCC)    Onychomycosis 03/05/2012   Pain in lower limb 04/18/2013   Sciatic nerve pain    Sepsis (HCC) 11/22/2022   Sepsis secondary to UTI (HCC) 08/24/2022   Stroke-like symptoms 10/28/2015   Vertigo    Past Surgical History:  Past Surgical History:  Procedure Laterality Date   BLADDER SURGERY     Mesh implant   COLONOSCOPY     LARYNX SURGERY     vocal cords   PARTIAL HYSTERECTOMY  1978   VESICOVAGINAL FISTULA CLOSURE W/ TAH  1976   HPI:  89 y.o. female with medical history significant of hypertension, hyperlipidemia, diabetes, GERD, CVA, PAD, hypothyroidism, diastolic CHF, dementia, depression, anxiety, neuropathy presenting with bruising and altered mental status. Pt s/p fall 12/15 with hematoma over left eye. Following DC from hospital pt with some reduced responsiveness, prompting ED visit. CT of head 12/31/23 without acute intracranial abnormality, small frontal hematoma, and chronic left basal ganglia infarct.    Assessment / Plan / Recommendation  Clinical Impression  Pt presents with an oral dysphagia and suspected mild pharyngeal phase dysphagia. Pt was  arousable for PO trials but with hx of waxing and waning mentation. Provided oral care (baseline upper and lower dentures; removed for cleaning and placed prior to PO trials). No s/sx of aspiration during 3 oz water challenge. Pt did however have one instance of cough following thin liquids via straw towards end of assessment (1/10) trials. Mastication of solids was slow, prolonged, and with delayed oral transit as well as oral residuals. Sips of thin liquids asssited oral clearance. Recommend conservative dysphagia 1 (puree) and thin liquids with meds in puree (crush as needed). Recommend full supervision with all PO for swallow safety in setting of cognitive deficits. SLP to follow up.  SLP Visit Diagnosis: Dysphagia, oral phase (R13.11);Dysphagia, unspecified (R13.10)    Aspiration Risk  Mild aspiration risk    Diet Recommendation   Thin;Dysphagia 1 (puree)  Medication Administration: Whole meds with puree (crush meds as needed)    Other Recommendations Oral Care Recommendations: Oral care BID     Swallow Evaluation Recommendations     Assistance Recommended at Discharge    Functional Status Assessment Patient has had a recent decline in their functional status and demonstrates the ability to make significant improvements in function in a reasonable and predictable amount of time.  Frequency and Duration min 1 x/week  2 weeks       Prognosis Prognosis for improved oropharyngeal function: Good Barriers to Reach Goals: Cognitive deficits      Swallow Study   General Date of Onset: 12/31/23 HPI: 89 y.o. female with medical history significant of hypertension,  hyperlipidemia, diabetes, GERD, CVA, PAD, hypothyroidism, diastolic CHF, dementia, depression, anxiety, neuropathy presenting with bruising and altered mental status. Pt s/p fall 12/15 with hematoma over left eye. Following DC from hospital pt with some reduced responsiveness, prompting ED visit. CT of head 12/31/23 without acute  intracranial abnormality, small frontal hematoma, and chronic left basal ganglia infarct. Type of Study: Bedside Swallow Evaluation Previous Swallow Assessment: 2024, D1, thin liquids clinical swallow eval Diet Prior to this Study: NPO Temperature Spikes Noted: No Respiratory Status: Room air History of Recent Intubation: No Behavior/Cognition: Requires cueing;Lethargic/Drowsy;Distractible Oral Cavity Assessment: Within Functional Limits Oral Care Completed by SLP: Yes Oral Cavity - Dentition: Dentures, bottom;Dentures, top (removed for cleaning, placed prior to PO trials) Vision: Functional for self-feeding Self-Feeding Abilities: Needs assist Patient Positioning: Upright in bed Baseline Vocal Quality: Low vocal intensity Volitional Cough: Cognitively unable to elicit Volitional Swallow: Unable to elicit    Oral/Motor/Sensory Function Overall Oral Motor/Sensory Function: Generalized oral weakness   Ice Chips Ice chips: Impaired Presentation: Spoon Oral Phase Impairments: Reduced lingual movement/coordination Oral Phase Functional Implications: Prolonged oral transit Pharyngeal Phase Impairments: Suspected delayed Swallow;Multiple swallows   Thin Liquid Thin Liquid: Impaired Presentation: Cup;Straw Pharyngeal  Phase Impairments: Suspected delayed Swallow;Multiple swallows;Cough - Delayed (cough in 1/10 trials)    Nectar Thick Nectar Thick Liquid: Not tested   Honey Thick Honey Thick Liquid: Not tested   Puree Puree: Impaired Presentation: Spoon Pharyngeal Phase Impairments: Suspected delayed Swallow;Multiple swallows   Solid     Solid: Impaired Presentation: Spoon Oral Phase Impairments: Impaired mastication;Reduced lingual movement/coordination Oral Phase Functional Implications: Impaired mastication;Prolonged oral transit;Oral residue Pharyngeal Phase Impairments: Suspected delayed Swallow;Multiple swallows      Tashia Leiterman H. MA, CCC-SLP Acute Rehabilitation Services    01/01/2024,8:51 AM

## 2024-01-01 NOTE — Progress Notes (Addendum)
 " PROGRESS NOTE  CASIA CORTI FMW:[id number] DOB: 28-Oct-1934 DOA: 12/31/2023 PCP: [name], DO   LOS: 0 days   Brief Narrative / Interim history: 89 year old female with history of HTN, HLD, DM2, prior CVA, PAD, hypothyroidism, diastolic CHF, dementia, depression who comes into the hospital from [facility] with increased bruising and altered mental status.  Patient had a ground-level fall on 12/15, she was seen in the ED, CT was reassuring and she was discharged.  Family has noticed increased bruising over the left eye since that time as well as decreased responsiveness.  She was brought back to the hospital.  Workup in the ED with a CT scan of the head was unremarkable, but she was found to be hypernatremic and was admitted to the hospital.  Subjective / 24h Interval events: She is doing well on my evaluation, sleepy but opens up and answers questions, has underlying dementia.  For me she denies any chest pain, dizziness or headaches, no abdominal discomfort, no nausea or vomiting  Assesement and Plan: Principal problem Hypernatremia -this is likely in the setting of possible poor p.o. intake over the last several days since her fall.  Has received fluids, dextrose , while sodium appears unchanged it actually is improving given correction for hyperglycemia - Continue D5W - Encourage p.o. intake  Active problems Acute metabolic encephalopathy, underlying dementia -possibly related to #1, recent fall.  Closely monitor  Fall -with left eye bruising, this is not present right after the fall, likely expected evolution.  Repeat CT of the head without acute findings, continue to monitor clinically.  She is able to open her eye without issues  Essential hypertension -hypertensive here, does not appear to be on medications.  Start amlodipine   Hyperlipidemia -continue home statin  Hypothyroidism-continue home Synthroid   DM 2, with hyperglycemia -has been placed on sliding scale,  continue.  A1c acceptable in her age group  Lab Results  Component Value Date   HGBA1C 7.6 (H) 12/31/2023   CBG (last 3)  Recent Labs    12/31/23 1845 12/31/23 2222 01/01/24 0903  GLUCAP 206* 105* 193*   Code status - DNR per durable yellow form, confirmed with Koren  Scheduled Meds:  aspirin   81 mg Oral Daily   atorvastatin   40 mg Oral Daily   enoxaparin  (LOVENOX ) injection  40 mg Subcutaneous Q24H   insulin  aspart  0-5 Units Subcutaneous QHS   insulin  aspart  0-9 Units Subcutaneous TID WC   [START ON 01/02/2024] levothyroxine   125 mcg Oral QAC breakfast   melatonin  5 mg Oral QHS   mirtazapine   7.5 mg Oral QHS   pantoprazole   20 mg Oral Daily   potassium chloride   40 mEq Oral Once   Continuous Infusions:  dextrose  75 mL/hr at 01/01/24 0714   PRN Meds:.acetaminophen  **OR** acetaminophen , labetalol , polyethylene glycol  Current Outpatient Medications  Medication Instructions   acetaminophen  (TYLENOL ) 500 mg, Daily PRN   aspirin  EC 81 mg, Oral, Daily, Swallow whole.   aspirin  81 mg, Oral, Daily   atorvastatin  (LIPITOR ) 40 mg, Oral, Daily   bisacodyl  (DULCOLAX) 10 mg, Daily PRN   Blood Glucose Monitoring Suppl DEVI 1 each, Does not apply, 3 times daily, May substitute to any manufacturer covered by patient's insurance.   calcium  carbonate (TUMS EX) 750 MG chewable tablet 2 tablets, Daily PRN   diclofenac  Sodium (VOLTAREN  ARTHRITIS PAIN) 2 g, Daily PRN   insulin  aspart protamine - aspart (NOVOLOG  70/30 MIX) (70-30) 100 UNIT/ML FlexPen 10 Units, Subcutaneous,  2 times daily with meals   Insulin  Pen Needle (PEN NEEDLES 3/16) 31G X 5 MM MISC 1 Pen, Does not apply, 2 times daily   levothyroxine  (SYNTHROID ) 125 mcg, Oral, Daily before breakfast   magnesium  hydroxide (MILK OF MAGNESIA) 400 MG/5ML suspension 30 mLs, Daily PRN   melatonin 5 mg, Daily at bedtime   mirtazapine  (REMERON ) 7.5 mg, Daily at bedtime   pantoprazole  (PROTONIX ) 20 mg, Oral, Daily   Sodium Phosphates  (ENEMA RE) 1 enema, Rectal, Daily PRN    Diet Orders (From admission, onward)     Start     Ordered   01/01/24 0843  DIET - DYS 1 Room service appropriate? Yes with Assist; Fluid consistency: Thin  Diet effective now       Comments: Meds in puree, crush as needed; full supervision  Question Answer Comment  Room service appropriate? Yes with Assist   Fluid consistency: Thin      01/01/24 0843            DVT prophylaxis: enoxaparin  (LOVENOX ) injection 40 mg Start: 12/31/23 1800   Lab Results  Component Value Date   PLT 222 01/01/2024      Code Status: Full Code  Family Communication: No family at bedside, unable to reach 1st contact Reyes, spoke with and updated Koren  Status is: Observation The patient will require care spanning > 2 midnights and should be moved to inpatient because: Hypernatremia  Level of care: Progressive  Consultants:  None  Objective: Vitals:   01/01/24 0750 01/01/24 0755 01/01/24 0800 01/01/24 0910  BP: (!) 186/97 (!) 169/81 (!) 180/114 (!) 155/93  Pulse: 86 94 (!) 118 69  Resp: (!) 24 14 19 13   Temp:      TempSrc:      SpO2: 100% 96% 90% 98%   No intake or output data in the 24 hours ending 01/01/24 0919 Wt Readings from Last 3 Encounters:  03/28/23 57 kg  01/31/23 57 kg  11/22/22 57 kg    Examination:  Constitutional: NAD Eyes: no scleral icterus, periorbital bruising on the left ENMT: Mucous membranes are moist.  Neck: normal, supple Respiratory: clear to auscultation bilaterally, no wheezing, no crackles. Normal respiratory effort. No accessory muscle use.  Cardiovascular: Regular rate and rhythm, no murmurs / rubs / gallops. No LE edema.  Abdomen: non distended, no tenderness. Bowel sounds positive.  Musculoskeletal: no clubbing / cyanosis.   Data Reviewed: I have independently reviewed following labs and imaging studies   CBC Recent Labs  Lab 12/27/23 1828 12/31/23 1115 12/31/23 1122 01/01/24 0507  WBC 7.6 9.8   --  9.6  HGB 12.2 12.3 12.6 12.0  HCT 38.3 37.7 37.0 37.4  PLT 266 263  --  222  MCV 81.1 82.3  --  82.0  MCH 25.8* 26.9  --  26.3  MCHC 31.9 32.6  --  32.1  RDW 15.9* 15.9*  --  16.0*  LYMPHSABS 1.7 1.9  --   --   MONOABS 0.8 0.8  --   --   EOSABS 0.1 0.1  --   --   BASOSABS 0.1 0.1  --   --     Recent Labs  Lab 12/27/23 1828 12/31/23 1115 12/31/23 1122 12/31/23 2031 12/31/23 2048 01/01/24 0507  NA 137 151* 151* 152*  --  152*  K 4.0 3.7 3.7 3.2*  --  3.1*  CL 98 111  --  114*  --  114*  CO2 31 30  --  29  --  27  GLUCOSE 379* 347*  --  175*  --  125*  BUN 19 26*  --  22  --  19  CREATININE 1.00 1.05*  --  0.88  --  0.71  CALCIUM  9.4 10.4*  --  9.9  --  9.6  AST 20 14*  --   --   --   --   ALT 16 12  --   --   --   --   ALKPHOS 67 81  --   --   --   --   BILITOT 0.5 0.6  --   --   --   --   ALBUMIN 3.4* 4.1  --   --   --   --   INR  --   --   --   --  1.0  --   HGBA1C  --   --   --  7.6*  --   --     ------------------------------------------------------------------------------------------------------------------ No results for input(s): CHOL, HDL, LDLCALC, TRIG, CHOLHDL, LDLDIRECT in the last 72 hours.  Lab Results  Component Value Date   HGBA1C 7.6 (H) 12/31/2023   ------------------------------------------------------------------------------------------------------------------ No results for input(s): TSH, T4TOTAL, T3FREE, THYROIDAB in the last 72 hours.  Invalid input(s): FREET3  Cardiac Enzymes No results for input(s): CKMB, TROPONINI, MYOGLOBIN in the last 168 hours.  Invalid input(s): CK ------------------------------------------------------------------------------------------------------------------ No results found for: BNP  CBG: Recent Labs  Lab 12/31/23 1133 12/31/23 1711 12/31/23 1845 12/31/23 2222 01/01/24 0903  GLUCAP 293* 171* 206* 105* 193*    No results found for this or any previous visit (from the  past 240 hours).   Radiology Studies: CT Head Wo Contrast Result Date: 12/31/2023 EXAM: CT HEAD WITHOUT CONTRAST 12/31/2023 01:21:48 PM TECHNIQUE: CT of the head was performed without the administration of intravenous contrast. Automated exposure control, iterative reconstruction, and/or weight based adjustment of the mA/kV was utilized to reduce the radiation dose to as low as reasonably achievable. COMPARISON: 12/27/2023 CLINICAL HISTORY: Mental status change, unknown cause. FINDINGS: BRAIN AND VENTRICLES: No acute hemorrhage. No evidence of acute infarct. Chronic left basal ganglia infarct. Proportional prominence of ventricles and sulci, consistent with diffuse cerebral parenchymal volume loss. Scattered periventricular and subcortical white matter hypoattenuation, consistent with mild chronic ischemic microvascular disease. No hydrocephalus. No extra-axial collection. No mass effect or midline shift. ORBITS: No acute abnormality. SINUSES: No acute abnormality. SOFT TISSUES AND SKULL: Small left frontal scalp hematoma. No acute fracture or radiopaque foreign body is associated. VASCULATURE: Calcified atherosclerotic plaque within cavernous/supraclinoid internal carotid arteries. IMPRESSION: 1. No acute intracranial abnormality. 2. Small left frontal scalp hematoma without associated acute fracture or radiopaque foreign body. 3. Diffuse cerebral parenchymal volume loss. 4. Mild chronic ischemic microvascular disease. 5. Chronic left basal ganglia infarct. Electronically signed by: Lonni Necessary MD 12/31/2023 01:28 PM EST RP Workstation: [id number]     Nilda Fendt, MD, PhD [facility]  Between 7 am - 7 pm I am available, please contact me via Amion (for emergencies) or Securechat (non urgent messages)  Between 7 pm - 7 am I am not available, please contact night coverage MD/APP via Amion  "

## 2024-01-01 NOTE — Evaluation (Signed)
 Occupational Therapy Evaluation Patient Details Name: Allison [name] MRN: [id number] DOB: September 26, 1934 Today's Date: 01/01/2024   History of Present Illness   Allison [name] is a 89 y.o. F who presented to [facility] ED 12/31/23 with bruising and AMS. CT head showed no acute abnormality. Pt noted to be somewhat decreased in her reactivity and responsiveness and ability to follow commands. PMHx: dementia, HTN, HLD, T2DM, hypothyroidism, CVA,  diastolic CHF, GERD, depression, anxiety, and neuropathy. Of note, recently seen 12/15 for fall out of her w/c where she hit her face first and had a 3 cm hematoma over her left eye.     Clinical Impressions Prior to this admission, patient at [facility]. Patient poor historian and oriented to name and birthdate only. Patient able to complete minimal grooming at bed level, and attempting to sit EOB, however no initiation shown and when OT attempting, patient actively resisting. OT recommending patient return to [facility] when medically stable. OT will continue to follow acutely.      If plan is discharge home, recommend the following:   Two people to help with walking and/or transfers;Two people to help with bathing/dressing/bathroom;Assistance with cooking/housework;Assistance with feeding;Direct supervision/assist for medications management;Direct supervision/assist for financial management;Assist for transportation;Help with stairs or ramp for entrance;Supervision due to cognitive status     Functional Status Assessment   Patient has had a recent decline in their functional status and demonstrates the ability to make significant improvements in function in a reasonable and predictable amount of time.     Equipment Recommendations   None recommended by OT (defer to next venue)     Recommendations for Other Services         Precautions/Restrictions   Precautions Precautions: Fall Recall of Precautions/Restrictions:  Impaired Precaution/Restrictions Comments: watch BP Restrictions Weight Bearing Restrictions Per Provider Order: No     Mobility Bed Mobility Overal bed mobility: Needs Assistance Bed Mobility: Supine to Sit           General bed mobility comments: Actively resisting despite attempts    Transfers Overall transfer level: Needs assistance                 General transfer comment: Unable to progress due to safety and decreased participation      Balance Overall balance assessment: Needs assistance                                         ADL either performed or assessed with clinical judgement   ADL Overall ADL's : Needs assistance/impaired Eating/Feeding: Set up;Bed level   Grooming: Wash/dry hands;Wash/dry face;Set up;Bed level   Upper Body Bathing: Maximal assistance;Bed level   Lower Body Bathing: Total assistance;Bed level   Upper Body Dressing : Maximal assistance;Bed level   Lower Body Dressing: Total assistance;Bed level     Toilet Transfer Details (indicate cue type and reason): unable to assess due to resistance when attempted Toileting- Clothing Manipulation and Hygiene: Total assistance;Bed level       Functional mobility during ADLs: Maximal assistance;+2 for physical assistance;+2 for safety/equipment;Cueing for safety;Cueing for sequencing General ADL Comments: Prior to this admission, patient at [facility]. Patient poor historian and oriented to name and birthdate only. Patient able to complete minimal grooming at bed level, and attempting to sit EOB, however no initiation shown and when OT attempting, patient actively resisting. OT recommending patient return to [facility] when medically stable.  OT will continue to follow acutely.     Vision   Vision Assessment?: No apparent visual deficits     Perception Perception: Not tested       Praxis Praxis: Not tested       Pertinent Vitals/Pain Pain Assessment Pain  Assessment: PAINAD Breathing: normal Negative Vocalization: none Facial Expression: smiling or inexpressive Body Language: relaxed Consolability: no need to console PAINAD Score: 0 Pain Intervention(s): Monitored during session, Limited activity within patient's tolerance, Repositioned     Extremity/Trunk Assessment Upper Extremity Assessment Upper Extremity Assessment: Generalized weakness;Right hand dominant   Lower Extremity Assessment Lower Extremity Assessment: Defer to PT evaluation   Cervical / Trunk Assessment Cervical / Trunk Assessment: Kyphotic   Communication Communication Communication: Impaired Factors Affecting Communication: Hearing impaired   Cognition Arousal: Lethargic, Alert Behavior During Therapy: Flat affect Cognition: History of cognitive impairments             OT - Cognition Comments: Dementia at baseline, decreased command following                 Following commands: Impaired Following commands impaired: Follows one step commands inconsistently, Follows one step commands with increased time     Cueing  General Comments   Cueing Techniques: Verbal cues;Gestural cues;Tactile cues  VSS   Exercises     Shoulder Instructions      Home Living Family/patient expects to be discharged to:: Skilled nursing facility                                 Additional Comments: Per Chart Review, pt from [facility] health [facility] rehab.      Prior Functioning/Environment Prior Level of Function : Patient poor historian/Family not available;Needs assist  Cognitive Assist : Mobility (cognitive);ADLs (cognitive)     Physical Assist : Mobility (physical);ADLs (physical)     Mobility Comments: Anticipate significant assistance with all mobility. Per chart review, pt fell out of her wheelchair 12/15. ADLs Comments: Anticipate significant assistance with all ADLs/IADLs.    OT Problem List: Decreased strength;Decreased range of  motion;Decreased activity tolerance;Impaired balance (sitting and/or standing);Decreased coordination;Decreased cognition;Decreased safety awareness;Decreased knowledge of precautions;Decreased knowledge of use of DME or AE   OT Treatment/Interventions: Self-care/ADL training;Therapeutic exercise;Energy conservation;DME and/or AE instruction;Manual therapy;Therapeutic activities;Patient/family education;Balance training      OT Goals(Current goals can be found in the care plan section)   Acute Rehab OT Goals Patient Stated Goal: unable OT Goal Formulation: With patient Time For Goal Achievement: 01/15/24 Potential to Achieve Goals: Good ADL Goals Pt Will Perform Upper Body Bathing: with contact guard assist;sitting Pt Will Perform Upper Body Dressing: with contact guard assist;sitting Pt Will Transfer to Toilet: with min assist;stand pivot transfer;bedside commode Pt Will Perform Toileting - Clothing Manipulation and hygiene: with min assist;sitting/lateral leans;sit to/from stand Additional ADL Goal #1: Patient will be able to sit EOB for 5-7 minutes without need for external assist or correction of balance as a precursor to OOB activities.   OT Frequency:  Min 2X/week    Co-evaluation              AM-PAC OT 6 Clicks Daily Activity     Outcome Measure Help from another person eating meals?: A Little Help from another person taking care of personal grooming?: A Little Help from another person toileting, which includes using toliet, bedpan, or urinal?: Total Help from another person bathing (including washing, rinsing, drying)?: A Lot  Help from another person to put on and taking off regular upper body clothing?: A Lot Help from another person to put on and taking off regular lower body clothing?: Total 6 Click Score: 12   End of Session Nurse Communication: Mobility status  Activity Tolerance: Patient tolerated treatment well Patient left: in bed;with bed alarm set;with  call bell/phone within reach  OT Visit Diagnosis: Adult, failure to thrive (R62.7);Other abnormalities of gait and mobility (R26.89);Unsteadiness on feet (R26.81);History of falling (Z91.81);Muscle weakness (generalized) (M62.81);Repeated falls (R29.6)                Time: 8943-8888 OT Time Calculation (min): 15 min Charges:  OT General Charges $OT Visit: 1 Visit OT Evaluation $OT Eval Moderate Complexity: 1 Mod Ronal Gift E. Anna-Marie Coller, OTR/L Acute Rehabilitation Services [phone]   Ronal Gift Salt 01/01/2024, 1:08 PM

## 2024-01-01 NOTE — ED Notes (Signed)
 Full linen change, brief change, gown change

## 2024-01-01 NOTE — ED Notes (Signed)
 Meds given crushed in applesauce, unable to swallow whole pills with applesauce.

## 2024-01-02 LAB — CBC
HCT: 37.1 % (ref 36.0–46.0)
Hemoglobin: 12.2 g/dL (ref 12.0–15.0)
MCH: 26.2 pg (ref 26.0–34.0)
MCHC: 32.9 g/dL (ref 30.0–36.0)
MCV: 79.8 fL — ABNORMAL LOW (ref 80.0–100.0)
Platelets: 236 K/uL (ref 150–400)
RBC: 4.65 MIL/uL (ref 3.87–5.11)
RDW: 15.9 % — ABNORMAL HIGH (ref 11.5–15.5)
WBC: 9.3 K/uL (ref 4.0–10.5)
nRBC: 0 % (ref 0.0–0.2)

## 2024-01-02 LAB — COMPREHENSIVE METABOLIC PANEL WITH GFR
ALT: 10 U/L (ref 0–44)
AST: 15 U/L (ref 15–41)
Albumin: 3.7 g/dL (ref 3.5–5.0)
Alkaline Phosphatase: 84 U/L (ref 38–126)
Anion gap: 12 (ref 5–15)
BUN: 21 mg/dL (ref 8–23)
CO2: 25 mmol/L (ref 22–32)
Calcium: 9.6 mg/dL (ref 8.9–10.3)
Chloride: 108 mmol/L (ref 98–111)
Creatinine, Ser: 0.85 mg/dL (ref 0.44–1.00)
GFR, Estimated: >60 mL/min
Glucose, Bld: 334 mg/dL — ABNORMAL HIGH (ref 70–99)
Potassium: 3.5 mmol/L (ref 3.5–5.1)
Sodium: 145 mmol/L (ref 135–145)
Total Bilirubin: 0.4 mg/dL (ref 0.0–1.2)
Total Protein: 6.7 g/dL (ref 6.5–8.1)

## 2024-01-02 LAB — GLUCOSE, CAPILLARY
Glucose-Capillary: 291 mg/dL — ABNORMAL HIGH (ref 70–99)
Glucose-Capillary: 236 mg/dL — ABNORMAL HIGH (ref 70–99)
Glucose-Capillary: 362 mg/dL — ABNORMAL HIGH (ref 70–99)
Glucose-Capillary: 93 mg/dL (ref 70–99)

## 2024-01-02 LAB — MAGNESIUM: Magnesium: 1.7 mg/dL (ref 1.7–2.4)

## 2024-01-02 MED ORDER — CARVEDILOL 6.25 MG PO TABS
6.2500 mg | ORAL_TABLET | Freq: Two times a day (BID) | ORAL | Status: DC
  Administered 2024-01-02 – 2024-01-04 (×5): 6.25 mg via ORAL
  Filled 2024-01-02: qty 1
  Filled 2024-01-02: qty 2
  Filled 2024-01-02 (×2): qty 1
  Filled 2024-01-02: qty 2

## 2024-01-02 MED ORDER — ENSURE PLUS HIGH PROTEIN PO LIQD
237.0000 mL | Freq: Two times a day (BID) | ORAL | Status: DC
  Administered 2024-01-02 – 2024-01-04 (×4): 237 mL via ORAL

## 2024-01-02 MED ORDER — POTASSIUM CHLORIDE CRYS ER 20 MEQ PO TBCR
40.0000 meq | EXTENDED_RELEASE_TABLET | Freq: Once | ORAL | Status: AC
  Administered 2024-01-02: 40 meq via ORAL
  Filled 2024-01-02: qty 2

## 2024-01-02 MED ORDER — MAGNESIUM SULFATE 2 GM/50ML IV SOLN
2.0000 g | Freq: Once | INTRAVENOUS | Status: AC
  Administered 2024-01-02: 2 g via INTRAVENOUS
  Filled 2024-01-02: qty 50

## 2024-01-02 MED ORDER — HYDRALAZINE HCL 20 MG/ML IJ SOLN: 10.0000 mg | Freq: Four times a day (QID) | INTRAMUSCULAR | Status: DC | PRN

## 2024-01-02 NOTE — Plan of Care (Signed)
 HTN this shift treated effectively with PRN Labetolol as ordered. Problem: Education: Goal: Ability to describe self-care measures that may prevent or decrease complications (Diabetes Survival Skills Education) will improve Outcome: Progressing Goal: Individualized Educational Video(s) Outcome: Progressing   Problem: Coping: Goal: Ability to adjust to condition or change in health will improve Outcome: Progressing   Problem: Fluid Volume: Goal: Ability to maintain a balanced intake and output will improve Outcome: Progressing   Problem: Health Behavior/Discharge Planning: Goal: Ability to identify and utilize available resources and services will improve Outcome: Progressing Goal: Ability to manage health-related needs will improve Outcome: Progressing   Problem: Metabolic: Goal: Ability to maintain appropriate glucose levels will improve Outcome: Progressing   Problem: Nutritional: Goal: Maintenance of adequate nutrition will improve Outcome: Progressing Goal: Progress toward achieving an optimal weight will improve Outcome: Progressing   Problem: Skin Integrity: Goal: Risk for impaired skin integrity will decrease Outcome: Progressing   Problem: Tissue Perfusion: Goal: Adequacy of tissue perfusion will improve Outcome: Progressing   Problem: Education: Goal: Knowledge of General Education information will improve Description: Including pain rating scale, medication(s)/side effects and non-pharmacologic comfort measures Outcome: Progressing   Problem: Health Behavior/Discharge Planning: Goal: Ability to manage health-related needs will improve Outcome: Progressing   Problem: Clinical Measurements: Goal: Ability to maintain clinical measurements within normal limits will improve Outcome: Progressing Goal: Will remain free from infection Outcome: Progressing Goal: Diagnostic test results will improve Outcome: Progressing Goal: Respiratory complications will  improve Outcome: Progressing Goal: Cardiovascular complication will be avoided Outcome: Progressing   Problem: Activity: Goal: Risk for activity intolerance will decrease Outcome: Progressing   Problem: Nutrition: Goal: Adequate nutrition will be maintained Outcome: Progressing   Problem: Coping: Goal: Level of anxiety will decrease Outcome: Progressing   Problem: Elimination: Goal: Will not experience complications related to bowel motility Outcome: Progressing Goal: Will not experience complications related to urinary retention Outcome: Progressing   Problem: Pain Managment: Goal: General experience of comfort will improve and/or be controlled Outcome: Progressing   Problem: Safety: Goal: Ability to remain free from injury will improve Outcome: Progressing   Problem: Skin Integrity: Goal: Risk for impaired skin integrity will decrease Outcome: Progressing

## 2024-01-02 NOTE — Progress Notes (Signed)
 "                                                                                                                                                                                                                                                                                PROGRESS NOTE     Patient Demographics:    Allison [name], is a 89 y.o. female, DOB - 1934-03-15, FMW:[id number]  Outpatient Primary MD for the patient is [name], DO    LOS - 1  Admit date - 12/31/2023    Chief Complaint  Patient presents with   Altered Mental Status       Brief Narrative (HPI from H&P)   89 year old female with history of HTN, HLD, DM2, prior CVA, PAD, hypothyroidism, diastolic CHF, dementia, depression who comes into the hospital from [facility] with increased bruising and altered mental status.  Patient had a ground-level fall on 12/15, she was seen in the ED, CT was reassuring and she was discharged.  Family has noticed increased bruising over the left eye since that time as well as decreased responsiveness.  She was brought back to the hospital.  Workup in the ED with a CT scan of the head was unremarkable, but she was found to be hypernatremic and was admitted to the hospital.     Subjective:    Allison [name] today has, No headache, No chest pain, No abdominal pain - No Nausea, No new weakness tingling or numbness, no shortness of breath, no complaints, mildly confused.   Assessment  & Plan :   Hypernatremia -this is likely in the setting of possible poor p.o. intake and dehydration improved with D5W continue to monitor.   Acute metabolic encephalopathy, underlying dementia -possibly related to #1, recent falls, stable head CT, no headache, monitor with supportive care, minimize narcotics and benzodiazepines.   Fall -with left eye bruising, this is not present right after the fall, likely expected evolution.  Repeat CT of the head without acute findings, continue to monitor clinically.   She is able to open her eye without issues   Essential hypertension -hypertensive here, Antley on Norvasc  added Coreg  and as needed hydralazine  for better  control.   Hyperlipidemia -continue home statin   Hypothyroidism-continue home Synthroid    DM 2, with hyperglycemia -has been placed on sliding scale, continue to monitor, CBGs slightly high due to recent D5W.  Lab Results  Component Value Date   HGBA1C 7.6 (H) 12/31/2023    CBG (last 3)  Recent Labs    01/01/24 1621 01/01/24 2132 01/02/24 0832  GLUCAP 297* 286* 291*         Condition - Extremely Guarded  Family Communication  :   Called son Reyes [phone]  on 01/02/2024 at 10:39 AM and message left. Called son Koren [phone]  on 01/02/2024 at 10:42 AM and message left  Code Status :  DNR  Consults  :  None  PUD Prophylaxis : PPI   Procedures  :     Head CT - nonacute.      Disposition Plan  :    Status is: Inpatient  DVT Prophylaxis  :    enoxaparin  (LOVENOX ) injection 40 mg Start: 12/31/23 1800    Lab Results  Component Value Date   PLT 236 01/02/2024    Diet :  Diet Order             DIET - DYS 1 Room service appropriate? Yes with Assist; Fluid consistency: Thin  Diet effective now                    Inpatient Medications  Scheduled Meds:  amLODipine   10 mg Oral Daily   aspirin   81 mg Oral Daily   atorvastatin   40 mg Oral Daily   carvedilol   6.25 mg Oral BID WC   enoxaparin  (LOVENOX ) injection  40 mg Subcutaneous Q24H   insulin  aspart  0-5 Units Subcutaneous QHS   insulin  aspart  0-9 Units Subcutaneous TID WC   levothyroxine   125 mcg Oral QAC breakfast   melatonin  5 mg Oral QHS   mirtazapine   7.5 mg Oral QHS   pantoprazole   20 mg Oral Daily   Continuous Infusions: PRN Meds:.acetaminophen  **OR** acetaminophen , hydrALAZINE , labetalol , polyethylene glycol  Antibiotics  :    Anti-infectives (From admission, onward)    None         Objective:   Vitals:    01/02/24 0000 01/02/24 0419 01/02/24 0800 01/02/24 0847  BP: (!) 186/84 (!) 189/100  (!) 157/85  Pulse: 78 83  68  Resp: 17     Temp: 97.9 F (36.6 C) (!) 97.4 F (36.3 C) (!) 97.4 F (36.3 C)   TempSrc: Axillary Oral Axillary   SpO2: 99% 100%      Wt Readings from Last 3 Encounters:  03/28/23 57 kg  01/31/23 57 kg  11/22/22 57 kg     Intake/Output Summary (Last 24 hours) at 01/02/2024 1038 Last data filed at 01/02/2024 0120 Gross per 24 hour  Intake 823.61 ml  Output 200 ml  Net 623.61 ml     Physical Exam  Awake, mildly confused, No new F.N deficits, Normal affect Allison [name].AT,PERRAL Supple Neck, No JVD,   Symmetrical Chest wall movement, Good air movement bilaterally, CTAB RRR,No Gallops,Rubs or new Murmurs,  +ve B.Sounds, Abd Soft, No tenderness,   No Cyanosis, Clubbing or edema        Data Review:    Recent Labs  Lab 12/27/23 1828 12/31/23 1115 12/31/23 1122 01/01/24 0507 01/02/24 0336  WBC 7.6 9.8  --  9.6 9.3  HGB 12.2 12.3 12.6 12.0 12.2  HCT 38.3 37.7 37.0 37.4 37.1  PLT 266 263  --  222 236  MCV 81.1 82.3  --  82.0 79.8*  MCH 25.8* 26.9  --  26.3 26.2  MCHC 31.9 32.6  --  32.1 32.9  RDW 15.9* 15.9*  --  16.0* 15.9*  LYMPHSABS 1.7 1.9  --   --   --   MONOABS 0.8 0.8  --   --   --   EOSABS 0.1 0.1  --   --   --   BASOSABS 0.1 0.1  --   --   --     Recent Labs  Lab 12/27/23 1828 12/31/23 1115 12/31/23 1122 12/31/23 2031 12/31/23 2048 01/01/24 0507 01/01/24 1209 01/01/24 1710 01/02/24 0336  NA 137 151* 151* 152*  --  152* 147* 146* 145  K 4.0 3.7 3.7 3.2*  --  3.1* 4.1  --  3.5  CL 98 111  --  114*  --  114* 110  --  108  CO2 31 30  --  29  --  27 26  --  25  ANIONGAP 8 10  --  9  --  12 12  --  12  GLUCOSE 379* 347*  --  175*  --  125* 247*  --  334*  BUN 19 26*  --  22  --  19 21  --  21  CREATININE 1.00 1.05*  --  0.88  --  0.71 0.85  --  0.85  AST 20 14*  --   --   --   --   --   --  15  ALT 16 12  --   --   --   --   --   --   10  ALKPHOS 67 81  --   --   --   --   --   --  84  BILITOT 0.5 0.6  --   --   --   --   --   --  0.4  ALBUMIN 3.4* 4.1  --   --   --   --   --   --  3.7  INR  --   --   --   --  1.0  --   --   --   --   HGBA1C  --   --   --  7.6*  --   --   --   --   --   MG  --   --   --   --   --   --   --   --  1.7  CALCIUM  9.4 10.4*  --  9.9  --  9.6 9.4  --  9.6      Recent Labs  Lab 12/31/23 1115 12/31/23 2031 12/31/23 2048 01/01/24 0507 01/01/24 1209 01/02/24 0336  INR  --   --  1.0  --   --   --   HGBA1C  --  7.6*  --   --   --   --   MG  --   --   --   --   --  1.7  CALCIUM  10.4* 9.9  --  9.6 9.4 9.6    --------------------------------------------------------------------------------------------------------------- Lab Results  Component Value Date   CHOL 234 (H) 02/10/2018   HDL 46 (L) 02/10/2018   LDLCALC 168 (H) 02/10/2018   TRIG 96 02/10/2018   CHOLHDL 5.1 (H) 02/10/2018    Lab Results  Component Value Date  HGBA1C 7.6 (H) 12/31/2023   No results for input(s): TSH, T4TOTAL, FREET4, T3FREE, THYROIDAB in the last 72 hours. No results for input(s): VITAMINB12, FOLATE, FERRITIN, TIBC, IRON, RETICCTPCT in the last 72 hours. ------------------------------------------------------------------------------------------------------------------ Cardiac Enzymes No results for input(s): CKMB, TROPONINI, MYOGLOBIN in the last 168 hours.  Invalid input(s): CK  Micro Results Recent Results (from the past 240 hours)  MRSA Next Gen by PCR, Nasal     Status: None   Collection Time: 01/01/24  1:26 PM   Specimen: Nasal Mucosa; Nasal Swab  Result Value Ref Range Status   MRSA by PCR Next Gen NOT DETECTED NOT DETECTED Final    Comment: (NOTE) The GeneXpert MRSA Assay (FDA approved for NASAL specimens only), is one component of a comprehensive MRSA colonization surveillance program. It is not intended to diagnose MRSA infection nor to guide or monitor  treatment for MRSA infections. Test performance is not FDA approved in patients less than 76 years old. Performed at [facility], [address] [place], [place] [address]     Radiology Report CT Head Wo Contrast Result Date: 12/31/2023 EXAM: CT HEAD WITHOUT CONTRAST 12/31/2023 01:21:48 PM TECHNIQUE: CT of the head was performed without the administration of intravenous contrast. Automated exposure control, iterative reconstruction, and/or weight based adjustment of the mA/kV was utilized to reduce the radiation dose to as low as reasonably achievable. COMPARISON: 12/27/2023 CLINICAL HISTORY: Mental status change, unknown cause. FINDINGS: BRAIN AND VENTRICLES: No acute hemorrhage. No evidence of acute infarct. Chronic left basal ganglia infarct. Proportional prominence of ventricles and sulci, consistent with diffuse cerebral parenchymal volume loss. Scattered periventricular and subcortical white matter hypoattenuation, consistent with mild chronic ischemic microvascular disease. No hydrocephalus. No extra-axial collection. No mass effect or midline shift. ORBITS: No acute abnormality. SINUSES: No acute abnormality. SOFT TISSUES AND SKULL: Small left frontal scalp hematoma. No acute fracture or radiopaque foreign body is associated. VASCULATURE: Calcified atherosclerotic plaque within cavernous/supraclinoid internal carotid arteries. IMPRESSION: 1. No acute intracranial abnormality. 2. Small left frontal scalp hematoma without associated acute fracture or radiopaque foreign body. 3. Diffuse cerebral parenchymal volume loss. 4. Mild chronic ischemic microvascular disease. 5. Chronic left basal ganglia infarct. Electronically signed by: Lonni Necessary MD 12/31/2023 01:28 PM EST RP Workstation: [id number]     Signature  -   Lavada Stank M.D on 01/02/2024 at 10:38 AM   -  To page go to [email or web address]   "

## 2024-01-02 NOTE — TOC Initial Note (Addendum)
 Transition of Care [facility]) - Initial/Assessment Note    Patient Details  Name: Allison [name] MRN: [id number] Date of Birth: Aug 11, 1934  Transition of Care [facility]) CM/SW Contact:    Jeoffrey LITTIE Maranda ISRAEL Phone Number: 01/02/2024, 10:23 AM  Clinical Narrative:                 Pt admitted from [facility] due to increase in altered mental status. CSW attempted to call both pt sons Katha & Koren) to discuss discharge plan but did not receive an answer form either. CSW completed updated Fl2 and sent to [facility]. CSW will continue to follow.    Barriers to Discharge: Continued Medical Work up   Patient Goals and CMS Choice            Expected Discharge Plan and Services       Living arrangements for the past 2 months: Single Family Home                                      Prior Living Arrangements/Services Living arrangements for the past 2 months: Single Family Home Lives with:: Self Patient language and need for interpreter reviewed:: Yes        Need for Family Participation in Patient Care: Yes (Comment) Care giver support system in place?: Yes (comment)   Criminal Activity/Legal Involvement Pertinent to Current Situation/Hospitalization: No - Comment as needed  Activities of Daily Living   ADL Screening (condition at time of admission) Independently performs ADLs?: No Does the patient have a NEW difficulty with bathing/dressing/toileting/self-feeding that is expected to last >3 days?: Yes (Initiates electronic notice to provider for possible OT consult) Does the patient have a NEW difficulty with getting in/out of bed, walking, or climbing stairs that is expected to last >3 days?: Yes (Initiates electronic notice to provider for possible PT consult) Does the patient have a NEW difficulty with communication that is expected to last >3 days?: Yes (Initiates electronic notice to provider for possible SLP consult) Is the patient deaf or have difficulty  hearing?: Yes Does the patient have difficulty seeing, even when wearing glasses/contacts?: Yes Does the patient have difficulty concentrating, remembering, or making decisions?: Yes  Permission Sought/Granted      Share Information with NAME: Reyes     Permission granted to share info w Relationship: Son  Permission granted to share info w Contact Information: [phone]  Emotional Assessment Appearance:: Appears stated age Attitude/Demeanor/Rapport: Unable to Assess Affect (typically observed): Unable to Assess Orientation: : Oriented to Self Alcohol / Substance Use: Not Applicable Psych Involvement: No (comment)  Admission diagnosis:  Hypernatremia [E87.0] Hyperglycemia [R73.9] Altered mental status, unspecified altered mental status type [R41.82] Patient Active Problem List   Diagnosis Date Noted   Diastolic heart failure (HCC) 12/31/2023   Constipation 11/22/2022   Malnutrition of moderate degree 08/27/2022   Acute encephalopathy 08/24/2022   DKA, type 2 (HCC) 08/15/2022   Alzheimer's dementia (HCC) 08/15/2022   Hypernatremia 08/15/2022   CAP (community acquired pneumonia) 01/07/2021   Family discord 03/28/2019   Neurocognitive deficits 01/04/2018   History of stroke 10/01/2016   Anxiety state 06/03/2016   Visual hallucinations 10/28/2015   Hx of adenomatous colonic polyps 06/19/2014   Neuropathic pain 03/27/2014   Uncontrolled type 2 diabetes mellitus with hyperglycemia, without long-term current use of insulin  (HCC) 03/27/2014   Bilateral carpal tunnel syndrome 03/27/2014   Dehydration 08/05/2013  Hypercalcemia 08/05/2013   PVD (peripheral vascular disease) (HCC) 03/05/2012   Onychomycosis 03/05/2012   Other hammer toe (acquired) 03/05/2012   Abnormal CXR 12/25/2011   Cystocele 06/11/2011   Incontinence 06/04/2011   Depression 06/04/2011   Hypothyroidism 06/23/2006   Dyslipidemia 06/23/2006   Essential hypertension 06/23/2006   GERD 06/23/2006   IBS  06/23/2006   PCP:  [name], DO Pharmacy:   [facility] [place], [place] - [address] [place] [address] Phone: [phone] Fax: [phone]     Social Drivers of Health (SDOH) Social History: SDOH Screenings   Food Insecurity: No Food Insecurity (01/01/2024)  Housing: Patient Unable To Answer (01/01/2024)  Transportation Needs: No Transportation Needs (01/01/2024)  Utilities: Not At Risk (01/01/2024)  Social Connections: Patient Unable To Answer (01/01/2024)  Tobacco Use: Low Risk (01/01/2024)   SDOH Interventions:     Readmission Risk Interventions    08/18/2022   12:38 PM  Readmission Risk Prevention Plan  Transportation Screening Complete  PCP or Specialist Appt within 5-7 Days Complete  Home Care Screening Complete  Medication Review (RN CM) Complete

## 2024-01-02 NOTE — Plan of Care (Signed)

## 2024-01-02 NOTE — Progress Notes (Signed)
 Some admission questions conpleted from provider and toc notes  PT ams and very sleepy

## 2024-01-03 DIAGNOSIS — E870 Hyperosmolality and hypernatremia: Secondary | ICD-10-CM | POA: Diagnosis not present

## 2024-01-03 LAB — CBC WITH DIFFERENTIAL/PLATELET
Abs Immature Granulocytes: 0.03 K/uL (ref 0.00–0.07)
Basophils Absolute: 0.1 K/uL (ref 0.0–0.1)
Basophils Relative: 1 %
Eosinophils Absolute: 0.4 K/uL (ref 0.0–0.5)
Eosinophils Relative: 4 %
HCT: 38.7 % (ref 36.0–46.0)
Hemoglobin: 12.6 g/dL (ref 12.0–15.0)
Immature Granulocytes: 0 %
Lymphocytes Relative: 34 %
Lymphs Abs: 3.2 K/uL (ref 0.7–4.0)
MCH: 26.1 pg (ref 26.0–34.0)
MCHC: 32.6 g/dL (ref 30.0–36.0)
MCV: 80.3 fL (ref 80.0–100.0)
Monocytes Absolute: 0.7 K/uL (ref 0.1–1.0)
Monocytes Relative: 8 %
Neutro Abs: 5 K/uL (ref 1.7–7.7)
Neutrophils Relative %: 53 %
Platelets: 249 K/uL (ref 150–400)
RBC: 4.82 MIL/uL (ref 3.87–5.11)
RDW: 15.9 % — ABNORMAL HIGH (ref 11.5–15.5)
WBC: 9.4 K/uL (ref 4.0–10.5)
nRBC: 0 % (ref 0.0–0.2)

## 2024-01-03 LAB — BASIC METABOLIC PANEL WITH GFR
Anion gap: 12 (ref 5–15)
BUN: 24 mg/dL — ABNORMAL HIGH (ref 8–23)
CO2: 25 mmol/L (ref 22–32)
Calcium: 9.8 mg/dL (ref 8.9–10.3)
Chloride: 111 mmol/L (ref 98–111)
Creatinine, Ser: 0.92 mg/dL (ref 0.44–1.00)
GFR, Estimated: >60 mL/min
Glucose, Bld: 151 mg/dL — ABNORMAL HIGH (ref 70–99)
Potassium: 4 mmol/L (ref 3.5–5.1)
Sodium: 148 mmol/L — ABNORMAL HIGH (ref 135–145)

## 2024-01-03 LAB — GLUCOSE, CAPILLARY
Glucose-Capillary: 145 mg/dL — ABNORMAL HIGH (ref 70–99)
Glucose-Capillary: 171 mg/dL — ABNORMAL HIGH (ref 70–99)
Glucose-Capillary: 169 mg/dL — ABNORMAL HIGH (ref 70–99)
Glucose-Capillary: 258 mg/dL — ABNORMAL HIGH (ref 70–99)

## 2024-01-03 LAB — MAGNESIUM: Magnesium: 2 mg/dL (ref 1.7–2.4)

## 2024-01-03 MED ORDER — INSULIN ASPART 100 UNIT/ML IJ SOLN
0.0000 [IU] | Freq: Three times a day (TID) | INTRAMUSCULAR | Status: DC
  Administered 2024-01-03: 3 [IU] via SUBCUTANEOUS
  Administered 2024-01-03: 2 [IU] via SUBCUTANEOUS
  Administered 2024-01-03: 3 [IU] via SUBCUTANEOUS
  Administered 2024-01-04: 5 [IU] via SUBCUTANEOUS
  Filled 2024-01-03 (×2): qty 3
  Filled 2024-01-03: qty 2
  Filled 2024-01-03: qty 5

## 2024-01-03 MED ORDER — SODIUM CHLORIDE 0.45 % IV SOLN: INTRAVENOUS | Status: AC

## 2024-01-03 MED ORDER — LACTATED RINGERS IV SOLN: INTRAVENOUS | Status: DC

## 2024-01-03 MED ORDER — INSULIN ASPART 100 UNIT/ML IJ SOLN
0.0000 [IU] | Freq: Every day | INTRAMUSCULAR | Status: DC
  Administered 2024-01-03: 3 [IU] via SUBCUTANEOUS
  Filled 2024-01-03: qty 3

## 2024-01-03 NOTE — Inpatient Diabetes Management (Signed)
 Inpatient Diabetes Program Recommendations  AACE/ADA: New Consensus Statement on Inpatient Glycemic Control (2015)  Target Ranges:  Prepandial:   less than 140 mg/dL      Peak postprandial:   less than 180 mg/dL (1-2 hours)      Critically ill patients:  140 - 180 mg/dL   Lab Results  Component Value Date   GLUCAP 145 (H) 01/03/2024   HGBA1C 7.6 (H) 12/31/2023    Review of Glycemic Control  Latest Reference Range & Units 01/02/24 17:05 01/02/24 20:15 01/03/24 07:39  Glucose-Capillary 70 - 99 mg/dL 637 (H) 93 854 (H)   Diabetes history: DM 2 Outpatient Diabetes medications:  Novolog  70/30 18 units bid Current orders for Inpatient glycemic control:  Novolog  0-15 units tid with meals and HS  Inpatient Diabetes Program Recommendations:    May consider adding Semglee  8 units daily.   Thanks,  Randall Bullocks, RN, BC-ADM Inpatient Diabetes Coordinator Pager [phone]  (8a-5p)

## 2024-01-03 NOTE — Progress Notes (Signed)
 "                                                                                                                                                                                                                                                                                PROGRESS NOTE     Patient Demographics:    Allison [name], is a 89 y.o. female, DOB - 1934/08/14, FMW:[id number]  Outpatient Primary MD for the patient is [name], DO    LOS - 2  Admit date - 12/31/2023    Chief Complaint  Patient presents with   Altered Mental Status       Brief Narrative (HPI from H&P)   89 year old female with history of HTN, HLD, DM2, prior CVA, PAD, hypothyroidism, diastolic CHF, dementia, depression who comes into the hospital from [facility] SN[facility] with increased bruising and altered mental status.  Patient had a ground-level fall on 12/15, she was seen in the ED, CT was reassuring and she was discharged.  Family has noticed increased bruising over the left eye since that time as well as decreased responsiveness.  She was brought back to the hospital.  Workup in the ED with a CT scan of the head was unremarkable, but she was found to be hypernatremic and was admitted to the hospital.     Subjective:   Patient in bed, appears comfortable, denies any headache, no fever, no chest pain or pressure, no shortness of breath , no abdominal pain. No new focal weakness, mildly confused.   Assessment  & Plan :   Hypernatremia -this is likely in the setting of possible poor p.o. intake and dehydration improved with D5W but it was switched to LR overnight with worsening hypernatremia, she does have DM type II and sugars run high on D5W hence we will try half-normal saline, encouraged to increase oral intake.   Acute metabolic encephalopathy, underlying dementia -possibly related to #1, recent falls, stable head CT, no headache, monitor with supportive care, minimize narcotics and benzodiazepines.   Fall  -with left eye bruising, this is not present right after the fall, likely expected evolution.  Repeat CT of the head without acute findings, continue to  monitor clinically.  She is able to open her eye without issues   Essential hypertension -hypertensive here, [place] on Norvasc  added Coreg  and as needed hydralazine  for better control.   Hyperlipidemia -continue home statin   Hypothyroidism-continue home Synthroid    DM 2, with hyperglycemia -has been placed on sliding scale, continue to monitor, CBGs slightly high due to recent D5W.  Lab Results  Component Value Date   HGBA1C 7.6 (H) 12/31/2023    CBG (last 3)  Recent Labs    01/02/24 1705 01/02/24 2015 01/03/24 0739  GLUCAP 362* 93 145*         Condition - Extremely Guarded  Family Communication  :   Called son Reyes [phone]  on 01/02/2024 at 10:39 AM and message left. Called son Koren [phone]  on 01/02/2024 at 10:42 AM and message left  Code Status :  DNR  Consults  :  None  PUD Prophylaxis : PPI   Procedures  :     Head CT - nonacute.      Disposition Plan  :    Status is: Inpatient  DVT Prophylaxis  :    enoxaparin  (LOVENOX ) injection 40 mg Start: 12/31/23 1800    Lab Results  Component Value Date   PLT 249 01/03/2024    Diet :  Diet Order             DIET - DYS 1 Room service appropriate? Yes with Assist; Fluid consistency: Thin  Diet effective now                    Inpatient Medications  Scheduled Meds:  amLODipine   10 mg Oral Daily   aspirin   81 mg Oral Daily   atorvastatin   40 mg Oral Daily   carvedilol   6.25 mg Oral BID WC   enoxaparin  (LOVENOX ) injection  40 mg Subcutaneous Q24H   feeding supplement  237 mL Oral BID BM   insulin  aspart  0-15 Units Subcutaneous TID WC   insulin  aspart  0-5 Units Subcutaneous QHS   levothyroxine   125 mcg Oral QAC breakfast   melatonin  5 mg Oral QHS   mirtazapine   7.5 mg Oral QHS   pantoprazole   20 mg Oral Daily   Continuous  Infusions:  sodium chloride      PRN Meds:.acetaminophen  **OR** acetaminophen , hydrALAZINE , labetalol , polyethylene glycol  Antibiotics  :    Anti-infectives (From admission, onward)    None         Objective:   Vitals:   01/02/24 1709 01/02/24 1930 01/03/24 0000 01/03/24 0740  BP: (!) 154/80 128/77 93/62 (!) 145/84  Pulse: 76 86 85 78  Resp:  12 16 14   Temp:  98 F (36.7 C) 98 F (36.7 C) 97.8 F (36.6 C)  TempSrc:  Axillary Axillary Axillary  SpO2:  98%  99%    Wt Readings from Last 3 Encounters:  03/28/23 57 kg  01/31/23 57 kg  11/22/22 57 kg     Intake/Output Summary (Last 24 hours) at 01/03/2024 0844 Last data filed at 01/02/2024 1800 Gross per 24 hour  Intake 192.24 ml  Output --  Net 192.24 ml     Physical Exam  Awake, mildly confused, No new F.N deficits, Normal affect [place].AT,PERRAL Supple Neck, No JVD,   Symmetrical Chest wall movement, Good air movement bilaterally, CTAB RRR,No Gallops,Rubs or new Murmurs,  +ve B.Sounds, Abd Soft, No tenderness,   No Cyanosis, Clubbing or edema  Data Review:    Recent Labs  Lab 12/27/23 1828 12/31/23 1115 12/31/23 1122 01/01/24 0507 01/02/24 0336 01/03/24 0711  WBC 7.6 9.8  --  9.6 9.3 9.4  HGB 12.2 12.3 12.6 12.0 12.2 12.6  HCT 38.3 37.7 37.0 37.4 37.1 38.7  PLT 266 263  --  222 236 249  MCV 81.1 82.3  --  82.0 79.8* 80.3  MCH 25.8* 26.9  --  26.3 26.2 26.1  MCHC 31.9 32.6  --  32.1 32.9 32.6  RDW 15.9* 15.9*  --  16.0* 15.9* 15.9*  LYMPHSABS 1.7 1.9  --   --   --  3.2  MONOABS 0.8 0.8  --   --   --  0.7  EOSABS 0.1 0.1  --   --   --  0.4  BASOSABS 0.1 0.1  --   --   --  0.1    Recent Labs  Lab 12/27/23 1828 12/31/23 1115 12/31/23 1122 12/31/23 2031 12/31/23 2048 01/01/24 0507 01/01/24 1209 01/01/24 1710 01/02/24 0336 01/03/24 0711  NA 137 151*   < > 152*  --  152* 147* 146* 145 148*  K 4.0 3.7   < > 3.2*  --  3.1* 4.1  --  3.5 4.0  CL 98 111  --  114*  --  114* 110  --   108 111  CO2 31 30  --  29  --  27 26  --  25 25  ANIONGAP 8 10  --  9  --  12 12  --  12 12  GLUCOSE 379* 347*  --  175*  --  125* 247*  --  334* 151*  BUN 19 26*  --  22  --  19 21  --  21 24*  CREATININE 1.00 1.05*  --  0.88  --  0.71 0.85  --  0.85 0.92  AST 20 14*  --   --   --   --   --   --  15  --   ALT 16 12  --   --   --   --   --   --  10  --   ALKPHOS 67 81  --   --   --   --   --   --  84  --   BILITOT 0.5 0.6  --   --   --   --   --   --  0.4  --   ALBUMIN 3.4* 4.1  --   --   --   --   --   --  3.7  --   INR  --   --   --   --  1.0  --   --   --   --   --   HGBA1C  --   --   --  7.6*  --   --   --   --   --   --   MG  --   --   --   --   --   --   --   --  1.7 2.0  CALCIUM  9.4 10.4*  --  9.9  --  9.6 9.4  --  9.6 9.8   < > = values in this interval not displayed.      Recent Labs  Lab 12/31/23 2031 12/31/23 2048 01/01/24 0507 01/01/24 1209 01/02/24 0336 01/03/24 0711  INR  --  1.0  --   --   --   --  HGBA1C 7.6*  --   --   --   --   --   MG  --   --   --   --  1.7 2.0  CALCIUM  9.9  --  9.6 9.4 9.6 9.8    --------------------------------------------------------------------------------------------------------------- Lab Results  Component Value Date   CHOL 234 (H) 02/10/2018   HDL 46 (L) 02/10/2018   LDLCALC 168 (H) 02/10/2018   TRIG 96 02/10/2018   CHOLHDL 5.1 (H) 02/10/2018    Lab Results  Component Value Date   HGBA1C 7.6 (H) 12/31/2023   No results for input(s): TSH, T4TOTAL, FREET4, T3FREE, THYROIDAB in the last 72 hours. No results for input(s): VITAMINB12, FOLATE, FERRITIN, TIBC, IRON, RETICCTPCT in the last 72 hours. ------------------------------------------------------------------------------------------------------------------ Cardiac Enzymes No results for input(s): CKMB, TROPONINI, MYOGLOBIN in the last 168 hours.  Invalid input(s): CK  Micro Results Recent Results (from the past 240 hours)  MRSA Next Gen  by PCR, Nasal     Status: None   Collection Time: 01/01/24  1:26 PM   Specimen: Nasal Mucosa; Nasal Swab  Result Value Ref Range Status   MRSA by PCR Next Gen NOT DETECTED NOT DETECTED Final    Comment: (NOTE) The GeneXpert MRSA Assay (FDA approved for NASAL specimens only), is one component of a comprehensive MRSA colonization surveillance program. It is not intended to diagnose MRSA infection nor to guide or monitor treatment for MRSA infections. Test performance is not FDA approved in patients less than 55 years old. Performed at [facility], [address] [place], [place] [address]     Radiology Report No results found.    Signature  -   Lavada Stank M.D on 01/03/2024 at 8:44 AM   -  To page go to [email or web address]   "

## 2024-01-03 NOTE — TOC Progression Note (Addendum)
 Transition of Care [facility]) - Progression Note    Patient Details  Name: Allison [name] MRN: [id number] Date of Birth: [birth year]  Transition of Care [facility]) CM/SW Contact  Inocente GORMAN Kindle, LCSW Phone Number: 01/03/2024, 1:40 PM  Clinical Narrative:    1:42 PM-CSW left a voicemail for patient's [facility] social worker, Clarita, to provide update and confirm that they will approve PTAR for transportation back to [facility] when stable.   4:04 PM-CSW spoke with Clarita who stated she does not have an answer regarding transport yet but that Rueben will be covering her tomorrow.    Expected Discharge Plan: Skilled Nursing Facility Barriers to Discharge: Continued Medical Work up               Expected Discharge Plan and Services In-house Referral: Clinical Social Work   Post Acute Care Choice: Skilled Nursing Facility Living arrangements for the past 2 months: Skilled Nursing Facility                                       Social Drivers of Health (SDOH) Interventions SDOH Screenings   Food Insecurity: No Food Insecurity (01/01/2024)  Housing: Patient Unable To Answer (01/01/2024)  Transportation Needs: No Transportation Needs (01/01/2024)  Utilities: Not At Risk (01/01/2024)  Social Connections: Patient Unable To Answer (01/01/2024)  Tobacco Use: Low Risk (01/01/2024)    Readmission Risk Interventions    08/18/2022   12:38 PM  Readmission Risk Prevention Plan  Transportation Screening Complete  PCP or Specialist Appt within 5-7 Days Complete  Home Care Screening Complete  Medication Review (RN CM) Complete

## 2024-01-03 NOTE — Progress Notes (Signed)
 Speech Language Pathology Treatment: Dysphagia  Patient Details Name: Allison [name] MRN: [id number] DOB: November 24, 1934 Today's Date: 01/03/2024 Time: 8975-8965 SLP Time Calculation (min) (ACUTE ONLY): 10 min  Assessment / Plan / Recommendation Clinical Impression  Pt is alert and fed herself given setup assistance. Dentures were donned and she promptly masticated bite-sized solids without significant residuals. Consecutive straw sips of water were observed without signs clinically concerning for aspiration. Discussed with pt's son, Allison [name], who reports pt typically consumes a regular diet. Suspect oral holding is possible and is dependent on mentation but given full supervision, she is attentive to POs. Will advance to Dys 3 solids and continue thin liquids with at least brief SLP f/u for further assessment.    HPI HPI: 89 y.o. female with medical history significant of hypertension, hyperlipidemia, diabetes, GERD, CVA, PAD, hypothyroidism, diastolic CHF, dementia, depression, anxiety, neuropathy presenting with bruising and altered mental status. Pt s/p fall 12/15 with hematoma over left eye. Following DC from hospital pt with some reduced responsiveness, prompting ED visit. CT of head 12/31/23 without acute intracranial abnormality, small frontal hematoma, and chronic left basal ganglia infarct.      SLP Plan  Continue with current plan of care        Swallow Evaluation Recommendations   Recommendations: PO diet PO Diet Recommendation: Dysphagia 3 (Mechanical soft);Thin liquids (Level 0) Liquid Administration via: Cup;Straw Medication Administration: Crushed with puree Supervision: Full assist for feeding;Full supervision/cueing for swallowing strategies Postural changes: Position pt fully upright for meals Oral care recommendations: Oral care BID (2x/day)     Recommendations                     Oral care BID   Frequent or constant Supervision/Assistance Dysphagia, oral  phase (R13.11);Dysphagia, unspecified (R13.10)     Continue with current plan of care     Damien Blumenthal, M.A., CCC-SLP Speech Language Pathology, Acute Rehabilitation Services  Secure Chat preferred [phone]   01/03/2024, 11:21 AM

## 2024-01-03 NOTE — Plan of Care (Signed)

## 2024-01-04 ENCOUNTER — Other Ambulatory Visit (HOSPITAL_COMMUNITY): Payer: Self-pay

## 2024-01-04 DIAGNOSIS — E870 Hyperosmolality and hypernatremia: Secondary | ICD-10-CM | POA: Diagnosis not present

## 2024-01-04 LAB — BASIC METABOLIC PANEL WITH GFR
Anion gap: 9 (ref 5–15)
BUN: 28 mg/dL — ABNORMAL HIGH (ref 8–23)
CO2: 25 mmol/L (ref 22–32)
Calcium: 9.3 mg/dL (ref 8.9–10.3)
Chloride: 107 mmol/L (ref 98–111)
Creatinine, Ser: 0.92 mg/dL (ref 0.44–1.00)
GFR, Estimated: 60 mL/min — ABNORMAL LOW
Glucose, Bld: 270 mg/dL — ABNORMAL HIGH (ref 70–99)
Potassium: 3.7 mmol/L (ref 3.5–5.1)
Sodium: 142 mmol/L (ref 135–145)

## 2024-01-04 LAB — CBC WITH DIFFERENTIAL/PLATELET
Abs Immature Granulocytes: 0.03 K/uL (ref 0.00–0.07)
Basophils Absolute: 0.1 K/uL (ref 0.0–0.1)
Basophils Relative: 1 %
Eosinophils Absolute: 0.4 K/uL (ref 0.0–0.5)
Eosinophils Relative: 4 %
HCT: 34.6 % — ABNORMAL LOW (ref 36.0–46.0)
Hemoglobin: 11.5 g/dL — ABNORMAL LOW (ref 12.0–15.0)
Immature Granulocytes: 0 %
Lymphocytes Relative: 33 %
Lymphs Abs: 3 K/uL (ref 0.7–4.0)
MCH: 26.6 pg (ref 26.0–34.0)
MCHC: 33.2 g/dL (ref 30.0–36.0)
MCV: 79.9 fL — ABNORMAL LOW (ref 80.0–100.0)
Monocytes Absolute: 0.8 K/uL (ref 0.1–1.0)
Monocytes Relative: 9 %
Neutro Abs: 4.7 K/uL (ref 1.7–7.7)
Neutrophils Relative %: 53 %
Platelets: 236 K/uL (ref 150–400)
RBC: 4.33 MIL/uL (ref 3.87–5.11)
RDW: 15.9 % — ABNORMAL HIGH (ref 11.5–15.5)
WBC: 9 K/uL (ref 4.0–10.5)
nRBC: 0 % (ref 0.0–0.2)

## 2024-01-04 LAB — MAGNESIUM: Magnesium: 1.8 mg/dL (ref 1.7–2.4)

## 2024-01-04 LAB — GLUCOSE, CAPILLARY: Glucose-Capillary: 250 mg/dL — ABNORMAL HIGH (ref 70–99)

## 2024-01-04 MED ORDER — AMLODIPINE BESYLATE 10 MG PO TABS: 10.0000 mg | ORAL_TABLET | Freq: Every day | ORAL | Status: AC

## 2024-01-04 MED ORDER — INSULIN ASPART 100 UNIT/ML FLEXPEN: PEN_INJECTOR | SUBCUTANEOUS | Status: AC

## 2024-01-04 MED ORDER — CARVEDILOL 6.25 MG PO TABS: 6.2500 mg | ORAL_TABLET | Freq: Two times a day (BID) | ORAL | Status: AC

## 2024-01-04 NOTE — Care Management Important Message (Signed)
 Important Message  Patient Details  Name: Allison [name] MRN: [id number] Date of Birth: Sep 10, 1934   Important Message Given:  Yes - Medicare IM     Claretta Deed 01/04/2024, 12:16 PM

## 2024-01-04 NOTE — Discharge Instructions (Addendum)
 Follow with Primary MD [name], [name], DO in 7 days   Get CBC, CMP, Magnesium , 2 view Chest X ray -  checked next visit with your primary MD or SNF MD   Activity: As tolerated with Full fall precautions use walker/cane & assistance as needed  Disposition Home   Diet: Dysphagia 3 half with feeding assistance and aspiration precautions.  Check CBGs q. ACHS.  Special Instructions: If you have smoked or chewed Tobacco  in the last 2 yrs please stop smoking, stop any regular Alcohol  and or any Recreational drug use.  On your next visit with your primary care physician please Get Medicines reviewed and adjusted.  Please request your Prim.MD to go over all Hospital Tests and Procedure/Radiological results at the follow up, please get all Hospital records sent to your Prim MD by signing hospital release before you go home.  If you experience worsening of your admission symptoms, develop shortness of breath, life threatening emergency, suicidal or homicidal thoughts you must seek medical attention immediately by calling 911 or calling your MD immediately  if symptoms less severe.  You Must read complete instructions/literature along with all the possible adverse reactions/side effects for all the Medicines you take and that have been prescribed to you. Take any new Medicines after you have completely understood and accpet all the possible adverse reactions/side effects.   Do not drive when taking Pain medications.  Do not take more than prescribed Pain, Sleep and Anxiety Medications  Wear Seat belts while driving.

## 2024-01-04 NOTE — Plan of Care (Signed)
 Patient has met goals for discharge.     Problem: Education: Goal: Ability to describe self-care measures that may prevent or decrease complications (Diabetes Survival Skills Education) will improve Outcome: Adequate for Discharge Goal: Individualized Educational Video(s) Outcome: Adequate for Discharge   Problem: Coping: Goal: Ability to adjust to condition or change in health will improve Outcome: Adequate for Discharge   Problem: Fluid Volume: Goal: Ability to maintain a balanced intake and output will improve Outcome: Adequate for Discharge   Problem: Health Behavior/Discharge Planning: Goal: Ability to identify and utilize available resources and services will improve Outcome: Adequate for Discharge Goal: Ability to manage health-related needs will improve Outcome: Adequate for Discharge   Problem: Metabolic: Goal: Ability to maintain appropriate glucose levels will improve Outcome: Adequate for Discharge   Problem: Nutritional: Goal: Maintenance of adequate nutrition will improve Outcome: Adequate for Discharge Goal: Progress toward achieving an optimal weight will improve Outcome: Adequate for Discharge   Problem: Skin Integrity: Goal: Risk for impaired skin integrity will decrease Outcome: Adequate for Discharge   Problem: Tissue Perfusion: Goal: Adequacy of tissue perfusion will improve Outcome: Adequate for Discharge   Problem: Education: Goal: Knowledge of General Education information will improve Description: Including pain rating scale, medication(s)/side effects and non-pharmacologic comfort measures Outcome: Adequate for Discharge   Problem: Health Behavior/Discharge Planning: Goal: Ability to manage health-related needs will improve Outcome: Adequate for Discharge   Problem: Clinical Measurements: Goal: Ability to maintain clinical measurements within normal limits will improve Outcome: Adequate for Discharge Goal: Will remain free from  infection Outcome: Adequate for Discharge Goal: Diagnostic test results will improve Outcome: Adequate for Discharge Goal: Respiratory complications will improve Outcome: Adequate for Discharge Goal: Cardiovascular complication will be avoided Outcome: Adequate for Discharge   Problem: Activity: Goal: Risk for activity intolerance will decrease Outcome: Adequate for Discharge   Problem: Nutrition: Goal: Adequate nutrition will be maintained Outcome: Adequate for Discharge   Problem: Coping: Goal: Level of anxiety will decrease Outcome: Adequate for Discharge   Problem: Elimination: Goal: Will not experience complications related to bowel motility Outcome: Adequate for Discharge Goal: Will not experience complications related to urinary retention Outcome: Adequate for Discharge   Problem: Pain Managment: Goal: General experience of comfort will improve and/or be controlled Outcome: Adequate for Discharge   Problem: Safety: Goal: Ability to remain free from injury will improve Outcome: Adequate for Discharge   Problem: Skin Integrity: Goal: Risk for impaired skin integrity will decrease Outcome: Adequate for Discharge

## 2024-01-04 NOTE — TOC Transition Note (Signed)
 Transition of Care [facility]) - Discharge Note   Patient Details  Name: Allison [name] MRN: [id number] Date of Birth: 13-Apr-1934  Transition of Care [facility]) CM/SW Contact:  Inocente GORMAN Kindle, LCSW Phone Number: 01/04/2024, 9:37 AM   Clinical Narrative:    Patient will DC to: [facility] Anticipated DC date: 01/04/24 Family notified: Left vm for son, Koren (vm full for son Reyes)  Transport by: [facility] (approved by [facility])   Per MD patient ready for DC to [facility]. RN to call report prior to discharge [phone] room [phone]). RN, patient, patient's family, and facility notified of DC. Discharge Summary and FL2 sent to facility. DC packet on chart including signed DNR. Ambulance transport requested for patient.   CSW will sign off for now as social work intervention is no longer needed. Please consult us  again if new needs arise.     Final next level of care: Skilled Nursing Facility Barriers to Discharge: Barriers Resolved   Patient Goals and CMS Choice Patient states their goals for this hospitalization and ongoing recovery are:: return to snf          Discharge Placement   Existing PASRR number confirmed : 01/04/24          Patient chooses bed at: [facility] Rehab Patient to be transferred to facility by: PTAR Name of family member notified: Left vm for son Patient and family notified of of transfer: 01/04/24  Discharge Plan and Services Additional resources added to the After Visit Summary for   In-house Referral: Clinical Social Work   Post Acute Care Choice: Skilled Nursing Facility                               Social Drivers of Health (SDOH) Interventions SDOH Screenings   Food Insecurity: No Food Insecurity (01/01/2024)  Housing: Patient Unable To Answer (01/01/2024)  Transportation Needs: No Transportation Needs (01/01/2024)  Utilities: Not At Risk (01/01/2024)  Social Connections: Patient Unable To Answer (01/01/2024)  Tobacco Use: Low  Risk (01/01/2024)     Readmission Risk Interventions    08/18/2022   12:38 PM  Readmission Risk Prevention Plan  Transportation Screening Complete  PCP or Specialist Appt within 5-7 Days Complete  Home Care Screening Complete  Medication Review (RN CM) Complete

## 2024-01-04 NOTE — TOC Progression Note (Signed)
 Transition of Care [facility]) - Progression Note    Patient Details  Name: Allison [name] MRN: [id number] Date of Birth: 1934-02-24  Transition of Care [facility]) CM/SW Contact  Inocente GORMAN Kindle, LCSW Phone Number: 01/04/2024, 9:38 AM  Clinical Narrative:    CSW updated [facility] of medical stability. CSW spoke with Rueben at [facility] who confirmed they have approved PTAR for transport.  CSW contacted patient's son, Reyes, but vm is full. CW left vm for son, Koren. Valerie's number went to voicemail.  Will arrange for transport.     Expected Discharge Plan: Skilled Nursing Facility Barriers to Discharge: Barriers Resolved               Expected Discharge Plan and Services In-house Referral: Clinical Social Work   Post Acute Care Choice: Skilled Nursing Facility Living arrangements for the past 2 months: Skilled Nursing Facility Expected Discharge Date: 01/04/24                                     Social Drivers of Health (SDOH) Interventions SDOH Screenings   Food Insecurity: No Food Insecurity (01/01/2024)  Housing: Patient Unable To Answer (01/01/2024)  Transportation Needs: No Transportation Needs (01/01/2024)  Utilities: Not At Risk (01/01/2024)  Social Connections: Patient Unable To Answer (01/01/2024)  Tobacco Use: Low Risk (01/01/2024)    Readmission Risk Interventions    08/18/2022   12:38 PM  Readmission Risk Prevention Plan  Transportation Screening Complete  PCP or Specialist Appt within 5-7 Days Complete  Home Care Screening Complete  Medication Review (RN CM) Complete

## 2024-01-04 NOTE — Progress Notes (Signed)
" °   01/04/24 0800  Level of Consciousness  Level of Consciousness Responds to Pain  MEWS COLOR  MEWS Score Color Yellow  PCA/Epidural/Spinal Assessment  Respiratory Pattern Regular;Unlabored;Symmetrical  Glasgow Coma Scale  Eye Opening 3  Best Verbal Response (NON-intubated) 4  Best Motor Response 5  Glasgow Coma Scale Score 12  MEWS Score  MEWS Temp 0  MEWS Systolic 0  MEWS Pulse 0  MEWS RR 0  MEWS LOC 2  MEWS Score 2   Provider aware  "

## 2024-01-04 NOTE — Plan of Care (Signed)

## 2024-01-04 NOTE — Discharge Summary (Addendum)
 "                                                                                                                                                                               Discharge summary note.  Allison [name] FMW:[id number] DOB: [birth year] DOA: 12/31/2023  PCP: [name], DO  Admit date: 12/31/2023  Discharge date: 01/04/2024  Admitted From: SNF   Disposition:  SNF   Recommendations for Outpatient Follow-up:   Follow up with PCP in 1-2 weeks  PCP Please obtain BMP/CBC, 2 view CXR in 1week,  (see Discharge instructions)   PCP Please follow up on the following pending results:    Home Health: None   Equipment/Devices: None  Consultations: None  Discharge Condition: Stable    CODE STATUS: DNR  Diet Recommendation: Dysphagia 3 diet with feeding assistance and aspiration precautions, check CBGs q. ACHS.    Chief Complaint  Patient presents with   Altered Mental Status     Brief history of present illness from the day of admission and additional interim summary    89 year old female with history of HTN, HLD, DM2, prior CVA, PAD, hypothyroidism, diastolic CHF, dementia, depression who comes into the hospital from [facility] with increased bruising and altered mental status. Patient had a ground-level fall on 12/15, she was seen in the ED, CT was reassuring and she was discharged. Family has noticed increased bruising over the left eye since that time as well as decreased responsiveness. She was brought back to the hospital. Workup in the ED with a CT scan of the head was unremarkable, but she was found to be hypernatremic and was admitted to the hospital.                                                                  Hospital Course   Hypernatremia -this is likely in the setting of possible poor p.o. intake and dehydration improved after hydration, it might be worthwhile involving palliative care at SNF as poor oral intake with declining mental status and  advanced age could be her baseline and this will recur in due time, involve palliative care at [facility] for long-term goals of care.  She is DNR.  Symptom-free this morning, pleasantly confused.   Acute metabolic encephalopathy, underlying dementia -possibly related to #1, recent falls, stable head CT, no headache, monitor with supportive care, minimize narcotics and benzodiazepines.  Note due to underlying dementia, advanced age and frail status  it would be appropriate to involve palliative care at [facility] and focus on long-term comfort care.   Fall -with left eye bruising, this is not present right after the fall, likely expected evolution.  Repeat CT of the head without acute findings, continue to monitor clinically.  She is able to open her eye without issues   Essential hypertension -blood pressure in poor control placed on Norvasc  and Coreg  with good effect, monitor and adjust.   Hyperlipidemia -continue home statin   Hypothyroidism-continue home Synthroid    DM 2, with hyperglycemia -continue home 7030 added sliding scale for better control.  Check CBGs q. ACHS.  Discharge diagnosis     Principal Problem:   Hypernatremia Active Problems:   Alzheimer's dementia (HCC)   Acute encephalopathy   Hypothyroidism   Dyslipidemia   Essential hypertension   GERD   PVD (peripheral vascular disease) (HCC)   Neuropathic pain   Uncontrolled type 2 diabetes mellitus with hyperglycemia, without long-term current use of insulin  (HCC)   Anxiety state   History of stroke   Depression    Discharge instructions    Discharge Instructions     Discharge instructions   Complete by: As directed    Follow with Primary MD [name], [name], DO in 7 days   Get CBC, CMP, Magnesium , 2 view Chest X ray -  checked next visit with your primary MD or SNF MD   Activity: As tolerated with Full fall precautions use walker/cane & assistance as needed  Disposition Home   Diet: Dysphagia 3 half with feeding  assistance and aspiration precautions.  Check CBGs q. ACHS.  Special Instructions: If you have smoked or chewed Tobacco  in the last 2 yrs please stop smoking, stop any regular Alcohol  and or any Recreational drug use.  On your next visit with your primary care physician please Get Medicines reviewed and adjusted.  Please request your Prim.MD to go over all Hospital Tests and Procedure/Radiological results at the follow up, please get all Hospital records sent to your Prim MD by signing hospital release before you go home.  If you experience worsening of your admission symptoms, develop shortness of breath, life threatening emergency, suicidal or homicidal thoughts you must seek medical attention immediately by calling 911 or calling your MD immediately  if symptoms less severe.  You Must read complete instructions/literature along with all the possible adverse reactions/side effects for all the Medicines you take and that have been prescribed to you. Take any new Medicines after you have completely understood and accpet all the possible adverse reactions/side effects.   Do not drive when taking Pain medications.  Do not take more than prescribed Pain, Sleep and Anxiety Medications  Wear Seat belts while driving.   Increase activity slowly   Complete by: As directed        Discharge Medications   Allergies as of 01/04/2024       Reactions   Lantus  [insulin  Glargine] Itching, Swelling   angioedema   Levemir  [insulin  Detemir] Itching, Swelling   angioedema   Pineapple Anaphylaxis   Not listed on the [facility]   Codeine Other (See Comments)   Not listed on the [facility]   Invokana [canagliflozin] Other (See Comments)   Constipation, nightmares Not listed on the [facility]   Penicillins Other (See Comments)   Unknown reaction per MAR   Sulfa Antibiotics Other (See Comments)   Unknown reaction per MAR **Sulfasalazine included on MAR**   Tradjenta  [linagliptin ] Other (See Comments)  Not listed on  the [facility]   Vioxx [rofecoxib] Other (See Comments)   Not listed on the [facility]   Exelon [rivastigmine] Rash        Medication List     TAKE these medications    acetaminophen  500 MG tablet Commonly known as: TYLENOL  Take 500 mg by mouth daily as needed for mild pain.   amLODipine  10 MG tablet Commonly known as: NORVASC  Take 1 tablet (10 mg total) by mouth daily.   aspirin  81 MG chewable tablet Chew 81 mg by mouth daily. What changed: Another medication with the same name was removed. Continue taking this medication, and follow the directions you see here.   atorvastatin  40 MG tablet Commonly known as: LIPITOR  Take 1 tablet (40 mg total) by mouth daily.   bisacodyl  10 MG suppository Commonly known as: DULCOLAX Place 10 mg rectally daily as needed for moderate constipation.   Blood Glucose Monitoring Suppl Devi 1 each by Does not apply route in the morning, at noon, and at bedtime. May substitute to any manufacturer covered by patient's insurance.   calcium  carbonate 750 MG chewable tablet Commonly known as: TUMS EX Chew 2 tablets by mouth daily as needed (indigestion).   carvedilol  6.25 MG tablet Commonly known as: COREG  Take 1 tablet (6.25 mg total) by mouth 2 (two) times daily with a meal.   ENEMA RE Place 1 enema rectally daily as needed (for constipation).   insulin  aspart 100 UNIT/ML FlexPen Commonly known as: NOVOLOG  Before each meal 3 times a day, 140-199 - 2 units, 200-250 - 4 units, 251-299 - 6 units,  300-349 - 8 units,  350 or above 10 units.   insulin  aspart protamine - aspart (70-30) 100 UNIT/ML FlexPen Commonly known as: NOVOLOG  70/30 MIX Inject 10 Units into the skin 2 (two) times daily with a meal. What changed: how much to take   levothyroxine  125 MCG tablet Commonly known as: SYNTHROID  Take 1 tablet (125 mcg total) by mouth daily before breakfast.   magnesium  hydroxide 400 MG/5ML suspension Commonly known as: MILK OF MAGNESIA Take 30 mLs by  mouth daily as needed for mild constipation.   melatonin 5 MG Tabs Take 5 mg by mouth at bedtime.   mirtazapine  7.5 MG tablet Commonly known as: REMERON  Take 7.5 mg by mouth at bedtime.   pantoprazole  20 MG tablet Commonly known as: PROTONIX  Take 1 tablet (20 mg total) by mouth daily.   Pen Needles 3/16 31G X 5 MM Misc 1 Pen by Does not apply route in the morning and at bedtime.   Voltaren  Arthritis Pain 1 % Gel Generic drug: diclofenac  Sodium Apply 2 g topically daily as needed (for pain).         Follow-up Information     Reed, [name], DO. Schedule an appointment as soon as possible for a visit in 1 week(s).   Specialty: Geriatric Medicine Contact information: [address] [place] [address] [phone]                 Major procedures and Radiology Reports - PLEASE review detailed and final reports thoroughly  -      CT Head Wo Contrast Result Date: 12/31/2023 EXAM: CT HEAD WITHOUT CONTRAST 12/31/2023 01:21:48 PM TECHNIQUE: CT of the head was performed without the administration of intravenous contrast. Automated exposure control, iterative reconstruction, and/or weight based adjustment of the mA/kV was utilized to reduce the radiation dose to as low as reasonably achievable. COMPARISON: 12/27/2023 CLINICAL  HISTORY: Mental status change, unknown cause. FINDINGS: BRAIN AND VENTRICLES: No acute hemorrhage. No evidence of acute infarct. Chronic left basal ganglia infarct. Proportional prominence of ventricles and sulci, consistent with diffuse cerebral parenchymal volume loss. Scattered periventricular and subcortical white matter hypoattenuation, consistent with mild chronic ischemic microvascular disease. No hydrocephalus. No extra-axial collection. No mass effect or midline shift. ORBITS: No acute abnormality. SINUSES: No acute abnormality. SOFT TISSUES AND SKULL: Small left frontal scalp hematoma. No acute fracture or radiopaque foreign body is  associated. VASCULATURE: Calcified atherosclerotic plaque within cavernous/supraclinoid internal carotid arteries. IMPRESSION: 1. No acute intracranial abnormality. 2. Small left frontal scalp hematoma without associated acute fracture or radiopaque foreign body. 3. Diffuse cerebral parenchymal volume loss. 4. Mild chronic ischemic microvascular disease. 5. Chronic left basal ganglia infarct. Electronically signed by: Lonni Necessary MD 12/31/2023 01:28 PM EST RP Workstation: [id number]   CT Cervical Spine Wo Contrast Result Date: 12/27/2023 EXAM: CT CERVICAL SPINE WITHOUT CONTRAST 12/27/2023 06:13:07 PM TECHNIQUE: CT of the cervical spine was performed without the administration of intravenous contrast. Multiplanar reformatted images are provided for review. Automated exposure control, iterative reconstruction, and/or weight based adjustment of the mA/kV was utilized to reduce the radiation dose to as low as reasonably achievable. COMPARISON: None available. CLINICAL HISTORY: Neck trauma (Age >= 65y) FINDINGS: BONES AND ALIGNMENT: No acute fracture or traumatic malalignment. DEGENERATIVE CHANGES: Mild degenerative changes of the mid cervical spine. SOFT TISSUES: No prevertebral soft tissue swelling. IMPRESSION: 1. No evidence of acute traumatic injury. Electronically signed by: Pinkie Pebbles MD 12/27/2023 06:50 PM EST RP Workstation: [id number]   CT Head Wo Contrast Result Date: 12/27/2023 EXAM: CT HEAD WITHOUT 12/27/2023 06:13:07 PM TECHNIQUE: CT of the head was performed without the administration of intravenous contrast. Automated exposure control, iterative reconstruction, and/or weight based adjustment of the mA/kV was utilized to reduce the radiation dose to as low as reasonably achievable. COMPARISON: 11/21/2022 CLINICAL HISTORY: Head trauma, minor (Age >= 65y) FINDINGS: BRAIN AND VENTRICLES: No acute intracranial hemorrhage. No mass effect or midline shift. No extra-axial fluid collection.  Old left basal ganglia infarct, unchanged. No evidence of acute infarct. No hydrocephalus. Mild diffuse atrophy. Mild periventricular white matter hypodensity, likely chronic small vessel ischemic change. Atherosclerotic calcifications within the cavernous internal carotid arteries. ORBITS: No acute abnormality. SINUSES AND MASTOIDS: No acute abnormality. SOFT TISSUES AND SKULL: No acute skull fracture. Small left forehead scalp hematoma. IMPRESSION: 1. No acute intracranial abnormality. 2. Small left forehead scalp hematoma. Electronically signed by: Pinkie Pebbles MD 12/27/2023 06:49 PM EST RP Workstation: [id number]    Micro Results    Recent Results (from the past 240 hours)  MRSA Next Gen by PCR, Nasal     Status: None   Collection Time: 01/01/24  1:26 PM   Specimen: Nasal Mucosa; Nasal Swab  Result Value Ref Range Status   MRSA by PCR Next Gen NOT DETECTED NOT DETECTED Final    Comment: (NOTE) The GeneXpert MRSA Assay (FDA approved for NASAL specimens only), is one component of a comprehensive MRSA colonization surveillance program. It is not intended to diagnose MRSA infection nor to guide or monitor treatment for MRSA infections. Test performance is not FDA approved in patients less than 25 years old. Performed at [facility], [address], [place], [place] [address]     Today   Subjective    Allison [name] today has no headache,no chest abdominal pain,no new weakness tingling or numbness, feels much better    Objective  Blood pressure 110/62, pulse 90, temperature (!) 97.4 F (36.3 C), temperature source Axillary, resp. rate 16, SpO2 100%.   Intake/Output Summary (Last 24 hours) at 01/04/2024 0858 Last data filed at 01/04/2024 0259 Gross per 24 hour  Intake 1000.83 ml  Output --  Net 1000.83 ml    Exam  Awake but pleasantly confused, left periorbital and facial hematoma from previous fall, No new F.N deficits,    [place].AT,PERRAL Supple Neck,    Symmetrical Chest wall movement, Good air movement bilaterally, CTAB RRR,No Gallops,   +ve B.Sounds, Abd Soft, Non tender,  No Cyanosis, Clubbing or edema    Data Review   Recent Labs  Lab 12/31/23 1115 12/31/23 1122 01/01/24 0507 01/02/24 0336 01/03/24 0711 01/04/24 0545  WBC 9.8  --  9.6 9.3 9.4 9.0  HGB 12.3 12.6 12.0 12.2 12.6 11.5*  HCT 37.7 37.0 37.4 37.1 38.7 34.6*  PLT 263  --  222 236 249 236  MCV 82.3  --  82.0 79.8* 80.3 79.9*  MCH 26.9  --  26.3 26.2 26.1 26.6  MCHC 32.6  --  32.1 32.9 32.6 33.2  RDW 15.9*  --  16.0* 15.9* 15.9* 15.9*  LYMPHSABS 1.9  --   --   --  3.2 3.0  MONOABS 0.8  --   --   --  0.7 0.8  EOSABS 0.1  --   --   --  0.4 0.4  BASOSABS 0.1  --   --   --  0.1 0.1    Recent Labs  Lab 12/31/23 1115 12/31/23 1122 12/31/23 2031 12/31/23 2048 01/01/24 0507 01/01/24 1209 01/01/24 1710 01/02/24 0336 01/03/24 0711 01/04/24 0545  NA 151*   < > 152*  --  152* 147* 146* 145 148* 142  K 3.7   < > 3.2*  --  3.1* 4.1  --  3.5 4.0 3.7  CL 111  --  114*  --  114* 110  --  108 111 107  CO2 30  --  29  --  27 26  --  25 25 25   ANIONGAP 10  --  9  --  12 12  --  12 12 9   GLUCOSE 347*  --  175*  --  125* 247*  --  334* 151* 270*  BUN 26*  --  22  --  19 21  --  21 24* 28*  CREATININE 1.05*  --  0.88  --  0.71 0.85  --  0.85 0.92 0.92  AST 14*  --   --   --   --   --   --  15  --   --   ALT 12  --   --   --   --   --   --  10  --   --   ALKPHOS 81  --   --   --   --   --   --  84  --   --   BILITOT 0.6  --   --   --   --   --   --  0.4  --   --   ALBUMIN 4.1  --   --   --   --   --   --  3.7  --   --   INR  --   --   --  1.0  --   --   --   --   --   --  HGBA1C  --   --  7.6*  --   --   --   --   --   --   --   MG  --   --   --   --   --   --   --  1.7 2.0 1.8  CALCIUM  10.4*  --  9.9  --  9.6 9.4  --  9.6 9.8 9.3   < > = values in this interval not displayed.    Total Time in preparing paper work, data evaluation and todays exam - 35  minutes  Signature  -    Lavada Stank M.D on 01/04/2024 at 8:58 AM   -  To page go to [email or web address]      "

## 2024-02-16 ENCOUNTER — Encounter (HOSPITAL_COMMUNITY): Payer: Self-pay

## 2024-02-16 ENCOUNTER — Inpatient Hospital Stay (HOSPITAL_COMMUNITY)
Admission: EM | Admit: 2024-02-16 | Payer: Medicare (Managed Care) | Source: Home / Self Care | Attending: Family Medicine | Admitting: Family Medicine

## 2024-02-16 ENCOUNTER — Other Ambulatory Visit: Payer: Self-pay

## 2024-02-16 DIAGNOSIS — E870 Hyperosmolality and hypernatremia: Secondary | ICD-10-CM | POA: Diagnosis not present

## 2024-02-16 DIAGNOSIS — E860 Dehydration: Secondary | ICD-10-CM | POA: Diagnosis present

## 2024-02-16 DIAGNOSIS — E1165 Type 2 diabetes mellitus with hyperglycemia: Secondary | ICD-10-CM | POA: Diagnosis present

## 2024-02-16 DIAGNOSIS — N390 Urinary tract infection, site not specified: Secondary | ICD-10-CM | POA: Diagnosis present

## 2024-02-16 DIAGNOSIS — E440 Moderate protein-calorie malnutrition: Secondary | ICD-10-CM | POA: Diagnosis present

## 2024-02-16 DIAGNOSIS — R410 Disorientation, unspecified: Secondary | ICD-10-CM

## 2024-02-16 DIAGNOSIS — E785 Hyperlipidemia, unspecified: Secondary | ICD-10-CM | POA: Diagnosis present

## 2024-02-16 DIAGNOSIS — I739 Peripheral vascular disease, unspecified: Secondary | ICD-10-CM | POA: Diagnosis present

## 2024-02-16 LAB — COMPREHENSIVE METABOLIC PANEL WITH GFR
ALT: 11 U/L (ref 0–44)
AST: 16 U/L (ref 15–41)
Albumin: 4.2 g/dL (ref 3.5–5.0)
Alkaline Phosphatase: 87 U/L (ref 38–126)
Anion gap: 18 — ABNORMAL HIGH (ref 5–15)
BUN: 58 mg/dL — ABNORMAL HIGH (ref 8–23)
CO2: 23 mmol/L (ref 22–32)
Calcium: 10.5 mg/dL — ABNORMAL HIGH (ref 8.9–10.3)
Chloride: 121 mmol/L — ABNORMAL HIGH (ref 98–111)
Creatinine, Ser: 1.3 mg/dL — ABNORMAL HIGH (ref 0.44–1.00)
GFR, Estimated: 40 mL/min — ABNORMAL LOW
Glucose, Bld: 342 mg/dL — ABNORMAL HIGH (ref 70–99)
Potassium: 4.1 mmol/L (ref 3.5–5.1)
Sodium: 162 mmol/L (ref 135–145)
Total Bilirubin: 0.7 mg/dL (ref 0.0–1.2)
Total Protein: 7.9 g/dL (ref 6.5–8.1)

## 2024-02-16 LAB — CBC WITH DIFFERENTIAL/PLATELET
Abs Immature Granulocytes: 0.07 10*3/uL (ref 0.00–0.07)
Basophils Absolute: 0.1 10*3/uL (ref 0.0–0.1)
Basophils Relative: 1 %
Eosinophils Absolute: 0 10*3/uL (ref 0.0–0.5)
Eosinophils Relative: 0 %
HCT: 46.8 % — ABNORMAL HIGH (ref 36.0–46.0)
Hemoglobin: 14.7 g/dL (ref 12.0–15.0)
Immature Granulocytes: 1 %
Lymphocytes Relative: 22 %
Lymphs Abs: 3 10*3/uL (ref 0.7–4.0)
MCH: 26.5 pg (ref 26.0–34.0)
MCHC: 31.4 g/dL (ref 30.0–36.0)
MCV: 84.5 fL (ref 80.0–100.0)
Monocytes Absolute: 1.4 10*3/uL — ABNORMAL HIGH (ref 0.1–1.0)
Monocytes Relative: 11 %
Neutro Abs: 9 10*3/uL — ABNORMAL HIGH (ref 1.7–7.7)
Neutrophils Relative %: 65 %
Platelets: 291 10*3/uL (ref 150–400)
RBC: 5.54 MIL/uL — ABNORMAL HIGH (ref 3.87–5.11)
RDW: 18.5 % — ABNORMAL HIGH (ref 11.5–15.5)
WBC: 13.6 10*3/uL — ABNORMAL HIGH (ref 4.0–10.5)
nRBC: 0 % (ref 0.0–0.2)

## 2024-02-16 LAB — OSMOLALITY: Osmolality: 376 mosm/kg (ref 275–295)

## 2024-02-16 LAB — I-STAT CHEM 8, ED
BUN: 54 mg/dL — ABNORMAL HIGH (ref 8–23)
Calcium, Ion: 1.28 mmol/L (ref 1.15–1.40)
Chloride: 122 mmol/L — ABNORMAL HIGH (ref 98–111)
Creatinine, Ser: 1.4 mg/dL — ABNORMAL HIGH (ref 0.44–1.00)
Glucose, Bld: 333 mg/dL — ABNORMAL HIGH (ref 70–99)
HCT: 39 % (ref 36.0–46.0)
Hemoglobin: 13.3 g/dL (ref 12.0–15.0)
Potassium: 3.8 mmol/L (ref 3.5–5.1)
Sodium: 165 mmol/L (ref 135–145)
TCO2: 26 mmol/L (ref 22–32)

## 2024-02-16 LAB — CBG MONITORING, ED: Glucose-Capillary: 307 mg/dL — ABNORMAL HIGH (ref 70–99)

## 2024-02-16 LAB — TSH: TSH: 4.59 u[IU]/mL — ABNORMAL HIGH (ref 0.350–4.500)

## 2024-02-16 MED ORDER — MIRTAZAPINE 15 MG PO TABS
7.5000 mg | ORAL_TABLET | Freq: Every day | ORAL | Status: AC
  Administered 2024-02-17 – 2024-02-18 (×2): 7.5 mg via ORAL
  Filled 2024-02-16 (×2): qty 1

## 2024-02-16 MED ORDER — SODIUM CHLORIDE 0.9 % IV BOLUS
500.0000 mL | Freq: Once | INTRAVENOUS | Status: AC
  Administered 2024-02-16: 500 mL via INTRAVENOUS

## 2024-02-16 MED ORDER — CARVEDILOL 3.125 MG PO TABS
6.2500 mg | ORAL_TABLET | Freq: Two times a day (BID) | ORAL | Status: DC
  Filled 2024-02-16: qty 2

## 2024-02-16 MED ORDER — INSULIN ASPART PROT & ASPART (70-30 MIX) 100 UNIT/ML ~~LOC~~ SUSP
10.0000 [IU] | Freq: Two times a day (BID) | SUBCUTANEOUS | Status: DC
  Filled 2024-02-16: qty 10

## 2024-02-16 MED ORDER — BISACODYL 10 MG RE SUPP: 10.0000 mg | Freq: Every day | RECTAL | Status: AC | PRN

## 2024-02-16 MED ORDER — INSULIN ASPART 100 UNIT/ML IJ SOLN
0.0000 [IU] | Freq: Every day | INTRAMUSCULAR | Status: AC
  Administered 2024-02-17: 5 [IU] via SUBCUTANEOUS
  Administered 2024-02-18: 3 [IU] via SUBCUTANEOUS
  Filled 2024-02-16: qty 3
  Filled 2024-02-16: qty 5

## 2024-02-16 MED ORDER — INSULIN ASPART 100 UNIT/ML IJ SOLN
0.0000 [IU] | Freq: Three times a day (TID) | INTRAMUSCULAR | Status: AC
  Administered 2024-02-17 (×3): 3 [IU] via SUBCUTANEOUS
  Administered 2024-02-18: 5 [IU] via SUBCUTANEOUS
  Administered 2024-02-18 (×2): 3 [IU] via SUBCUTANEOUS
  Filled 2024-02-16: qty 5
  Filled 2024-02-16: qty 3
  Filled 2024-02-16: qty 1
  Filled 2024-02-16: qty 3
  Filled 2024-02-16: qty 5
  Filled 2024-02-16: qty 3

## 2024-02-16 MED ORDER — MELATONIN 5 MG PO TABS
5.0000 mg | ORAL_TABLET | Freq: Every day | ORAL | Status: AC
  Administered 2024-02-18: 5 mg via ORAL
  Filled 2024-02-16: qty 1

## 2024-02-16 MED ORDER — CALCIUM CARBONATE ANTACID 500 MG PO CHEW: 500.0000 mg | CHEWABLE_TABLET | Freq: Three times a day (TID) | ORAL | Status: AC | PRN

## 2024-02-16 MED ORDER — ASPIRIN 81 MG PO CHEW
81.0000 mg | CHEWABLE_TABLET | Freq: Every day | ORAL | Status: AC
  Administered 2024-02-18: 81 mg via ORAL
  Filled 2024-02-16 (×2): qty 1

## 2024-02-16 MED ORDER — ACETAMINOPHEN 650 MG RE SUPP: 650.0000 mg | Freq: Four times a day (QID) | RECTAL | Status: AC | PRN

## 2024-02-16 MED ORDER — SODIUM CHLORIDE 0.45 % IV SOLN: INTRAVENOUS | Status: DC

## 2024-02-16 MED ORDER — HEPARIN SODIUM (PORCINE) 5000 UNIT/ML IJ SOLN
5000.0000 [IU] | Freq: Three times a day (TID) | INTRAMUSCULAR | Status: AC
  Administered 2024-02-17 – 2024-02-18 (×5): 5000 [IU] via SUBCUTANEOUS
  Filled 2024-02-16 (×5): qty 1

## 2024-02-16 MED ORDER — ONDANSETRON HCL 4 MG/2ML IJ SOLN: 4.0000 mg | Freq: Four times a day (QID) | INTRAMUSCULAR | Status: AC | PRN

## 2024-02-16 MED ORDER — ACETAMINOPHEN 325 MG PO TABS: 650.0000 mg | ORAL_TABLET | Freq: Four times a day (QID) | ORAL | Status: AC | PRN

## 2024-02-16 MED ORDER — PANTOPRAZOLE SODIUM 20 MG PO TBEC
20.0000 mg | DELAYED_RELEASE_TABLET | Freq: Every day | ORAL | Status: DC
  Filled 2024-02-16: qty 1

## 2024-02-16 MED ORDER — ATORVASTATIN CALCIUM 40 MG PO TABS
40.0000 mg | ORAL_TABLET | Freq: Every day | ORAL | Status: AC
  Administered 2024-02-18: 40 mg via ORAL
  Filled 2024-02-16 (×2): qty 1

## 2024-02-16 MED ORDER — ONDANSETRON HCL 4 MG PO TABS: 4.0000 mg | ORAL_TABLET | Freq: Four times a day (QID) | ORAL | Status: AC | PRN

## 2024-02-16 NOTE — ED Provider Notes (Signed)
 " [place] EMERGENCY DEPARTMENT AT [facility] Provider Note   CSN: [id number] Arrival date & time: 02/16/24  1925     Patient presents with: Fatigue   Allison [name] is a 89 y.o. female.   HPI     According to The patient, she has no pain anywhere.  According to Son: concerned for dehydration. Not eating or drinking anything. Lethargic. Usually dehydrated. No vomiting. Usually able to tell name and can sometimes tell where she is at. Mental status seems to be vary. No recent falls. Family okay with labs/fluids.   Previous medical history reviewed : Patient was last admitted and discharged in December 2025.  Hyponatremia.  Likely in setting of possible poor p.o. intake.  Dehydration.  Acute metabolic encephalopathy.  Underlying dementia.  Fall.   Prior to Admission medications  Medication Sig Start Date End Date Taking? Authorizing Provider  acetaminophen  (TYLENOL ) 500 MG tablet Take 500 mg by mouth daily as needed for mild pain.    [provider]  amLODipine  (NORVASC ) 10 MG tablet Take 1 tablet (10 mg total) by mouth daily. 01/04/24   Singh, Prashant K, MD  aspirin  81 MG chewable tablet Chew 81 mg by mouth daily.    [provider]  atorvastatin  (LIPITOR ) 40 MG tablet Take 1 tablet (40 mg total) by mouth daily. 08/18/22   Gonfa, Taye T, MD  bisacodyl  (DULCOLAX) 10 MG suppository Place 10 mg rectally daily as needed for moderate constipation.    [provider]  Blood Glucose Monitoring Suppl DEVI 1 each by Does not apply route in the morning, at noon, and at bedtime. May substitute to any manufacturer covered by patient's insurance. 08/18/22   Gonfa, Taye T, MD  calcium  carbonate (TUMS EX) 750 MG chewable tablet Chew 2 tablets by mouth daily as needed (indigestion).    [provider]  carvedilol  (COREG ) 6.25 MG tablet Take 1 tablet (6.25 mg total) by mouth 2 (two) times daily with a meal. 01/04/24   Dennise Lavada POUR, MD  diclofenac   Sodium (VOLTAREN  ARTHRITIS PAIN) 1 % GEL Apply 2 g topically daily as needed (for pain).    [provider]  insulin  aspart (NOVOLOG ) 100 UNIT/ML FlexPen Before each meal 3 times a day, 140-199 - 2 units, 200-250 - 4 units, 251-299 - 6 units,  300-349 - 8 units,  350 or above 10 units. 01/04/24   Singh, Prashant K, MD  insulin  aspart protamine - aspart (NOVOLOG  70/30 MIX) (70-30) 100 UNIT/ML FlexPen Inject 10 Units into the skin 2 (two) times daily with a meal. Patient taking differently: Inject 18 Units into the skin 2 (two) times daily with a meal. 08/31/22   Drusilla Sabas RAMAN, MD  Insulin  Pen Needle (PEN NEEDLES 3/16) 31G X 5 MM MISC 1 Pen by Does not apply route in the morning and at bedtime. 08/18/22   Gonfa, Taye T, MD  levothyroxine  (SYNTHROID ) 125 MCG tablet Take 1 tablet (125 mcg total) by mouth daily before breakfast. 08/18/22   Gonfa, Taye T, MD  magnesium  hydroxide (MILK OF MAGNESIA) 400 MG/5ML suspension Take 30 mLs by mouth daily as needed for mild constipation.    [provider]  melatonin 5 MG TABS Take 5 mg by mouth at bedtime.    [provider]  mirtazapine  (REMERON ) 7.5 MG tablet Take 7.5 mg by mouth at bedtime.    [provider]  pantoprazole  (PROTONIX ) 20 MG tablet Take 1 tablet (20 mg total) by mouth  daily. 01/10/22   Zammit, Joseph, MD  Sodium Phosphates (ENEMA RE) Place 1 enema rectally daily as needed (for constipation).    [provider]    Allergies: Lantus  [insulin  glargine], Levemir  [insulin  detemir], Pineapple, Codeine, Invokana [canagliflozin], Penicillins, Sulfa antibiotics, Tradjenta  [linagliptin ], Vioxx [rofecoxib], and Exelon [rivastigmine]    ROS: Unable to obtain due to dementia   Updated Vital Signs BP (!) 145/85 (BP Location: Right Arm)   Pulse 96   Temp (!) 97.2 F (36.2 C) (Oral)   Resp 20   Ht 5' 4 (1.626 m)   Wt 57 kg   SpO2 99%   BMI 21.57 kg/m   Physical Exam Vitals and nursing note reviewed.   Constitutional:      General: She is not in acute distress.    Appearance: She is well-developed.  HENT:     Head: Normocephalic and atraumatic.  Eyes:     Conjunctiva/sclera: Conjunctivae normal.  Cardiovascular:     Rate and Rhythm: Normal rate and regular rhythm.     Heart sounds: No murmur heard. Pulmonary:     Effort: Pulmonary effort is normal. No respiratory distress.     Breath sounds: Normal breath sounds.  Abdominal:     Palpations: Abdomen is soft.     Tenderness: There is no abdominal tenderness.  Musculoskeletal:        General: No swelling.     Cervical back: Neck supple.  Skin:    General: Skin is warm and dry.     Capillary Refill: Capillary refill takes less than 2 seconds.  Neurological:     Mental Status: She is alert.     Comments: Able to tell name but not time or place   Psychiatric:        Mood and Affect: Mood normal.     (all labs ordered are listed, but only abnormal results are displayed) Labs Reviewed  CBC WITH DIFFERENTIAL/PLATELET - Abnormal; Notable for the following components:      Result Value   WBC 13.6 (*)    RBC 5.54 (*)    HCT 46.8 (*)    RDW 18.5 (*)    Neutro Abs 9.0 (*)    Monocytes Absolute 1.4 (*)    All other components within normal limits  COMPREHENSIVE METABOLIC PANEL WITH GFR - Abnormal; Notable for the following components:   Sodium 162 (*)    Chloride 121 (*)    Glucose, Bld 342 (*)    BUN 58 (*)    Creatinine, Ser 1.30 (*)    Calcium  10.5 (*)    GFR, Estimated 40 (*)    Anion gap 18 (*)    All other components within normal limits  OSMOLALITY - Abnormal; Notable for the following components:   Osmolality 376 (*)    All other components within normal limits  TSH - Abnormal; Notable for the following components:   TSH 4.590 (*)    All other components within normal limits  CBG MONITORING, ED - Abnormal; Notable for the following components:   Glucose-Capillary 307 (*)    All other components within normal  limits  I-STAT CHEM 8, ED - Abnormal; Notable for the following components:   Sodium 165 (*)    Chloride 122 (*)    BUN 54 (*)    Creatinine, Ser 1.40 (*)    Glucose, Bld 333 (*)    All other components within normal limits  URINALYSIS, ROUTINE W REFLEX MICROSCOPIC  SODIUM, URINE, RANDOM  CREATININE,  URINE, RANDOM  OSMOLALITY, URINE  CBC  CREATININE, SERUM  BASIC METABOLIC PANEL WITH GFR  CBC  HEMOGLOBIN A1C    EKG: None  Radiology: No results found.   Procedures   Medications Ordered in the ED  aspirin  chewable tablet 81 mg (has no administration in time range)  atorvastatin  (LIPITOR ) tablet 40 mg (has no administration in time range)  carvedilol  (COREG ) tablet 6.25 mg (has no administration in time range)  mirtazapine  (REMERON ) tablet 7.5 mg (has no administration in time range)  insulin  aspart protamine - aspart (NOVOLOG  70/30 MIX) FlexPen 10 Units (has no administration in time range)  bisacodyl  (DULCOLAX) suppository 10 mg (has no administration in time range)  calcium  carbonate (TUMS - dosed in mg elemental calcium ) chewable tablet 200 mg of elemental calcium  (has no administration in time range)  pantoprazole  (PROTONIX ) EC tablet 20 mg (has no administration in time range)  melatonin tablet 5 mg (has no administration in time range)  heparin  injection 5,000 Units (has no administration in time range)  0.45 % sodium chloride  infusion (has no administration in time range)  acetaminophen  (TYLENOL ) tablet 650 mg (has no administration in time range)    Or  acetaminophen  (TYLENOL ) suppository 650 mg (has no administration in time range)  ondansetron  (ZOFRAN ) tablet 4 mg (has no administration in time range)    Or  ondansetron  (ZOFRAN ) injection 4 mg (has no administration in time range)  insulin  aspart (novoLOG ) injection 0-9 Units (has no administration in time range)  insulin  aspart (novoLOG ) injection 0-5 Units (has no administration in time range)  sodium  chloride 0.9 % bolus 500 mL (0 mLs Intravenous Stopped 02/16/24 2231)                                    Medical Decision Making Amount and/or Complexity of Data Reviewed Labs: ordered.  Risk Decision regarding hospitalization.     HPI:   According to The patient, she has no pain anywhere.  According to Son: concerned for dehydration. Not eating or drinking anything. Lethargic. Usually dehydrated. No vomiting. Usually able to tell name and can sometimes tell where she is at. Mental status seems to be vary. No recent falls. Family okay with labs/fluids.   Previous medical history reviewed : Patient was last admitted and discharged in December 2025.  Hyponatremia.  Likely in setting of possible poor p.o. intake.  Dehydration.  Acute metabolic encephalopathy.  Underlying dementia.  Fall.  MDM:   Upon examination, patient unable to stable.  Afebrile.  Normotensive.  O2 saturation 99% on room air.  Able to tell me her name.  Not able to tell location or date.  Denies any pain anywhere.  Otherwise not able to answer any questions.  Moving all extremities.  Soft benign abdomen.  Rebound or tenderness.  Likely dehydration as well as hyponatremia from poor p.o. intake.  History of this in the setting of patient's advanced dementia.  Spoke to family, they are okay with obtaining labs as well as fluids if needed.  Will obtain CMP.  Serum awesome.  UA to rule out UTI.  CBC to rule out, significant anemia.  No recent falls.  No signs of head trauma.  No indication  for CT head.   Reevaluation:   Labs show hyponatremia.  Elevated serum osm.  Some leukocytosis as well.  Likely all in the setting of dehydration.  Patient started on 500  cc of fluid.  Pending urine studies.  Afebrile.  No indication for antibiotics at this time.  Soft and benign abdomen.  No rebound guarding or tenderness.  Lung sounds clear to oscillation bilaterally as well. Will be admitted for further  care.   Interventions: 500 cc fluid     Social Determinant of Health: advanced dementia    Disposition and Follow Up: admit       Final diagnoses:  Hypernatremia  Disorientation    ED Discharge Orders     None          Simon Lavonia SAILOR, MD 02/16/24 2326  "

## 2024-02-16 NOTE — ED Notes (Signed)
 Report given to receiving RN.

## 2024-02-16 NOTE — H&P (Signed)
 " History and Physical    Allison [name] FMW:[id number] DOB: August 28, 1934 DOA: 02/16/2024  PCP: [name], DO  Patient coming from: [name]  I have personally briefly reviewed patient's old medical records in [facility]  Chief Complaint: Poor p.o. intake  HPI: Allison [name] is a 89 y.o. female with medical history significant of HTN, HLD, DM2, prior CVA, PAD, hypothyroidism, diastolic CHF, advanced dementia, who according to ED physician was brought into the ED by family family members due to concern of poor p.o. intake since last 3 days as well as generalized deconditioning and debility.  No other concerns.  No reports of fever, cough, chest pain, shortness of breath, problem with urination or with bowel movements or any other complaint.  Patient has advanced dementia, unable to provide any history.  Family is unavailable at the bedside when I saw her.  I tried calling patient's son but was unable to reach to him.  ED Course: Upon arrival to ED, patient hemodynamically stable.  CMP showed significant hypernatremia 165, AKI and hyperglycemia.  CBC shows leukocytosis.  Patient received 500 cc fluid bolus in the ED, hospitalist were called for admission.  Review of Systems: As per HPI otherwise negative.    Past Medical History:  Diagnosis Date   Acute ischemic stroke (HCC) 10/29/2015   Acute metabolic encephalopathy 08/24/2022   Carpal tunnel syndrome    CVA (cerebral vascular accident) (HCC) 10/28/2015   Dementia (HCC)    Diabetes mellitus without complication (HCC)    Diabetic ketoacidosis (HCC) 08/16/2022   Dysphagia    GERD (gastroesophageal reflux disease)    Hiatal hernia    HLD (hyperlipidemia)    Hx of adenomatous colonic polyps 06/19/2014   Hyperlipidemia    Hypertension    Hypothyroidism    Ischemic stroke (HCC)    Onychomycosis 03/05/2012   Pain in lower limb 04/18/2013   Sciatic nerve pain    Sepsis (HCC) 11/22/2022   Sepsis secondary to UTI (HCC)  08/24/2022   Stroke-like symptoms 10/28/2015   Vertigo     Past Surgical History:  Procedure Laterality Date   BLADDER SURGERY     Mesh implant   COLONOSCOPY     LARYNX SURGERY     vocal cords   PARTIAL HYSTERECTOMY  1978   VESICOVAGINAL FISTULA CLOSURE W/ TAH  1976     reports that she has never smoked. She has never used smokeless tobacco. She reports that she does not drink alcohol and does not use drugs.  Allergies[1]  Family History  Problem Relation Age of Onset   Alzheimer's disease Mother    Glaucoma Mother    Heart disease Father    Alzheimer's disease Father    Asthma Sister    Allergies Sister    Allergies Sister    Sleep apnea Sister    Heart disease Sister    Breast cancer Maternal Aunt    Pancreatic cancer Maternal Aunt    Prostate cancer Maternal Uncle    Prostate cancer Paternal Uncle    Alcoholism Maternal Uncle    Kidney disease Maternal Aunt    Heart disease Maternal Aunt     Prior to Admission medications  Medication Sig Start Date End Date Taking? Authorizing Provider  acetaminophen  (TYLENOL ) 500 MG tablet Take 500 mg by mouth daily as needed for mild pain.    [provider]  amLODipine  (NORVASC ) 10 MG tablet Take 1 tablet (10 mg total) by mouth daily. 01/04/24   Dennise,  Prashant K, MD  aspirin  81 MG chewable tablet Chew 81 mg by mouth daily.    [provider]  atorvastatin  (LIPITOR ) 40 MG tablet Take 1 tablet (40 mg total) by mouth daily. 08/18/22   Gonfa, Taye T, MD  bisacodyl  (DULCOLAX) 10 MG suppository Place 10 mg rectally daily as needed for moderate constipation.    [provider]  Blood Glucose Monitoring Suppl DEVI 1 each by Does not apply route in the morning, at noon, and at bedtime. May substitute to any manufacturer covered by patient's insurance. 08/18/22   Gonfa, Taye T, MD  calcium  carbonate (TUMS EX) 750 MG chewable tablet Chew 2 tablets by mouth daily as needed (indigestion).    [provider]   carvedilol  (COREG ) 6.25 MG tablet Take 1 tablet (6.25 mg total) by mouth 2 (two) times daily with a meal. 01/04/24   Dennise Lavada POUR, MD  diclofenac  Sodium (VOLTAREN  ARTHRITIS PAIN) 1 % GEL Apply 2 g topically daily as needed (for pain).    [provider]  insulin  aspart (NOVOLOG ) 100 UNIT/ML FlexPen Before each meal 3 times a day, 140-199 - 2 units, 200-250 - 4 units, 251-299 - 6 units,  300-349 - 8 units,  350 or above 10 units. 01/04/24   Singh, Prashant K, MD  insulin  aspart protamine - aspart (NOVOLOG  70/30 MIX) (70-30) 100 UNIT/ML FlexPen Inject 10 Units into the skin 2 (two) times daily with a meal. Patient taking differently: Inject 18 Units into the skin 2 (two) times daily with a meal. 08/31/22   Drusilla Sabas RAMAN, MD  Insulin  Pen Needle (PEN NEEDLES 3/16) 31G X 5 MM MISC 1 Pen by Does not apply route in the morning and at bedtime. 08/18/22   Gonfa, Taye T, MD  levothyroxine  (SYNTHROID ) 125 MCG tablet Take 1 tablet (125 mcg total) by mouth daily before breakfast. 08/18/22   Gonfa, Taye T, MD  magnesium  hydroxide (MILK OF MAGNESIA) 400 MG/5ML suspension Take 30 mLs by mouth daily as needed for mild constipation.    [provider]  melatonin 5 MG TABS Take 5 mg by mouth at bedtime.    [provider]  mirtazapine  (REMERON ) 7.5 MG tablet Take 7.5 mg by mouth at bedtime.    [provider]  pantoprazole  (PROTONIX ) 20 MG tablet Take 1 tablet (20 mg total) by mouth daily. 01/10/22   Zammit, Joseph, MD  Sodium Phosphates (ENEMA RE) Place 1 enema rectally daily as needed (for constipation).    [provider]    Physical Exam: Vitals:   02/16/24 1933 02/16/24 1936  BP:  (!) 145/85  Pulse:  96  Resp:  20  Temp:  (!) 97.2 F (36.2 C)  TempSrc:  Oral  SpO2:  99%  Weight: 57 kg   Height: 5' 4 (1.626 m)     Constitutional: NAD, calm, comfortable Vitals:   02/16/24 1933 02/16/24 1936  BP:  (!) 145/85  Pulse:  96  Resp:  20  Temp:  (!) 97.2 F  (36.2 C)  TempSrc:  Oral  SpO2:  99%  Weight: 57 kg   Height: 5' 4 (1.626 m)    Eyes: PERRL, lids and conjunctivae normal ENMT: Mucous membranes are dry. Posterior pharynx clear of any exudate or lesions.Normal dentition.  Neck: normal, supple, no masses, no thyromegaly Respiratory: clear to auscultation bilaterally, no wheezing, no crackles. Normal respiratory effort. No accessory muscle use.  Cardiovascular: Regular rate and rhythm, no murmurs / rubs / gallops. No  extremity edema. 2+ pedal pulses. No carotid bruits.  Abdomen: no tenderness, no masses palpated. No hepatosplenomegaly. Bowel sounds positive.  Musculoskeletal: no clubbing / cyanosis. No joint deformity upper and lower extremities. Good ROM, no contractures. Normal muscle tone.  Skin: no rashes, lesions, ulcers. No induration Neurologic: CN 2-12 grossly intact. Sensation intact, DTR normal. Strength 5/5 in all 4.    Labs on Admission: I have personally reviewed following labs and imaging studies  CBC: Recent Labs  Lab 02/16/24 1955 02/16/24 2237  WBC 13.6*  --   NEUTROABS 9.0*  --   HGB 14.7 13.3  HCT 46.8* 39.0  MCV 84.5  --   PLT 291  --    Basic Metabolic Panel: Recent Labs  Lab 02/16/24 2237  NA 165*  K 3.8  CL 122*  GLUCOSE 333*  BUN 54*  CREATININE 1.40*   GFR: Estimated Creatinine Clearance: 24.4 mL/min (A) (by C-G formula based on SCr of 1.4 mg/dL (H)). Liver Function Tests: No results for input(s): AST, ALT, ALKPHOS, BILITOT, PROT, ALBUMIN in the last 168 hours. No results for input(s): LIPASE, AMYLASE in the last 168 hours. No results for input(s): AMMONIA in the last 168 hours. Coagulation Profile: No results for input(s): INR, PROTIME in the last 168 hours. Cardiac Enzymes: No results for input(s): CKTOTAL, CKMB, CKMBINDEX, TROPONINI in the last 168 hours. BNP (last 3 results) No results for input(s): PROBNP in the last 8760 hours. HbA1C: No results for  input(s): HGBA1C in the last 72 hours. CBG: Recent Labs  Lab 02/16/24 1935  GLUCAP 307*   Lipid Profile: No results for input(s): CHOL, HDL, LDLCALC, TRIG, CHOLHDL, LDLDIRECT in the last 72 hours. Thyroid  Function Tests: Recent Labs    02/16/24 1955  TSH 4.590*   Anemia Panel: No results for input(s): VITAMINB12, FOLATE, FERRITIN, TIBC, IRON, RETICCTPCT in the last 72 hours. Urine analysis:    Component Value Date/Time   COLORURINE YELLOW 03/28/2023 2220   APPEARANCEUR TURBID (A) 03/28/2023 2220   LABSPEC 1.013 03/28/2023 2220   PHURINE 5.0 03/28/2023 2220   GLUCOSEU 150 (A) 03/28/2023 2220   HGBUR SMALL (A) 03/28/2023 2220   BILIRUBINUR NEGATIVE 03/28/2023 2220   KETONESUR NEGATIVE 03/28/2023 2220   PROTEINUR 100 (A) 03/28/2023 2220   UROBILINOGEN 0.2 03/29/2014 1055   NITRITE NEGATIVE 03/28/2023 2220   LEUKOCYTESUR MODERATE (A) 03/28/2023 2220    Radiological Exams on Admission: No results found.   Assessment/Plan Principal Problem:   Dehydration with hypernatremia   Failure to thrive in adult/hypovolemic hypernatremia/dehydration, POA: This appears to be recurrent issue for this patient.  She was recently hospitalized and discharged on 01/04/2024 for the similar issues.  I will start her on half-normal saline at 100 cc/h, monitor sodium every 6 hours.  Consult dietitian.  Due to recurrent hospitalization for the similar issue, I think it would be worthwhile to have goal of conversation discussion with the palliative care.  I have placed consult for the palliative care.  Leukocytosis: Nonspecific, afebrile.  Likely due to dehydration/hemoconcentration.  Repeat labs in the morning.  Essential hypertension: Patient on amlodipine  and Coreg .  Blood pressure controlled.  I will resume Coreg  but hold amlodipine  for now.  Uncontrolled type 2 diabetes mellitus with hyperglycemia:  Acquired hypothyroidism: Patient on Synthroid  125 mcg daily.  TSH  only slightly elevated.  Will increase Synthroid  to 150 mcg and recommend repeating TSH in 6 weeks.  Hyperlipidemia: Resume statin.  GERD: Continue PPI.  Advanced dementia: Does not appear to be  on any medications.  DVT prophylaxis: heparin  injection 5,000 Units Start: 02/16/24 2315 Code Status: DNR Family Communication: None present at bedside.  Disposition Plan: Discharge once sodium improved. Consults called: None  Fredia Skeeter MD [facility]  *Please note that this is a verbal dictation therefore any spelling or grammatical errors are due to the Dragon Medical One system interpretation.  Please page via Amion and do not message via secure chat for urgent patient care matters. Secure chat can be used for non urgent patient care matters. 02/16/2024, 11:12 PM  To contact the attending provider between 7A-7P or the covering provider during after hours 7P-7A, please log into the web site [email or web address]     [1]  Allergies Allergen Reactions   Lantus  [Insulin  Glargine] Itching and Swelling    angioedema   Levemir  [Insulin  Detemir] Itching and Swelling    angioedema   Pineapple Anaphylaxis    Not listed on the [facility]   Codeine Other (See Comments)    Not listed on the [facility]   Invokana [Canagliflozin] Other (See Comments)    Constipation, nightmares Not listed on the [facility]   Penicillins Other (See Comments)    Unknown reaction per MAR    Sulfa Antibiotics Other (See Comments)    Unknown reaction per MAR **Sulfasalazine included on MAR**   Tradjenta  [Linagliptin ] Other (See Comments)    Not listed on the [facility]   Vioxx [Rofecoxib] Other (See Comments)    Not listed on the [facility]   Exelon [Rivastigmine] Rash   "

## 2024-02-16 NOTE — ED Triage Notes (Signed)
 PT BIB [facility] from [place]. Family and staff report PT has been lethargIc and weak x 3 days. PT is also not eating. Cbg:414 with last insulin  given at lunchtime.

## 2024-02-17 DIAGNOSIS — N390 Urinary tract infection, site not specified: Secondary | ICD-10-CM | POA: Diagnosis present

## 2024-02-17 LAB — URINALYSIS, ROUTINE W REFLEX MICROSCOPIC
Bilirubin Urine: NEGATIVE
Glucose, UA: >=500 mg/dL — AB
Ketones, ur: 5 mg/dL — AB
Nitrite: NEGATIVE
Protein, ur: 100 mg/dL — AB
Specific Gravity, Urine: 1.013 (ref 1.005–1.030)
WBC, UA: >50 WBC/hpf (ref 0–5)
pH: 5 (ref 5.0–8.0)

## 2024-02-17 LAB — BASIC METABOLIC PANEL WITH GFR
Anion gap: 12 (ref 5–15)
BUN: 51 mg/dL — ABNORMAL HIGH (ref 8–23)
CO2: 26 mmol/L (ref 22–32)
Calcium: 10.1 mg/dL (ref 8.9–10.3)
Chloride: 126 mmol/L — ABNORMAL HIGH (ref 98–111)
Creatinine, Ser: 1.28 mg/dL — ABNORMAL HIGH (ref 0.44–1.00)
GFR, Estimated: 40 mL/min — ABNORMAL LOW
Glucose, Bld: 341 mg/dL — ABNORMAL HIGH (ref 70–99)
Potassium: 3.6 mmol/L (ref 3.5–5.1)
Sodium: 164 mmol/L (ref 135–145)

## 2024-02-17 LAB — CREATININE, SERUM
Creatinine, Ser: 1.27 mg/dL — ABNORMAL HIGH (ref 0.44–1.00)
GFR, Estimated: 41 mL/min — ABNORMAL LOW

## 2024-02-17 LAB — CBC
HCT: 43.3 % (ref 36.0–46.0)
Hemoglobin: 13.5 g/dL (ref 12.0–15.0)
MCH: 26.5 pg (ref 26.0–34.0)
MCHC: 31.2 g/dL (ref 30.0–36.0)
MCV: 85.1 fL (ref 80.0–100.0)
Platelets: 278 10*3/uL (ref 150–400)
RBC: 5.09 MIL/uL (ref 3.87–5.11)
RDW: 18.4 % — ABNORMAL HIGH (ref 11.5–15.5)
WBC: 13.3 10*3/uL — ABNORMAL HIGH (ref 4.0–10.5)
nRBC: 0 % (ref 0.0–0.2)

## 2024-02-17 LAB — CBG MONITORING, ED
Glucose-Capillary: 321 mg/dL — ABNORMAL HIGH (ref 70–99)
Glucose-Capillary: 233 mg/dL — ABNORMAL HIGH (ref 70–99)
Glucose-Capillary: 227 mg/dL — ABNORMAL HIGH (ref 70–99)
Glucose-Capillary: 222 mg/dL — ABNORMAL HIGH (ref 70–99)
Glucose-Capillary: 175 mg/dL — ABNORMAL HIGH (ref 70–99)

## 2024-02-17 LAB — HEMOGLOBIN A1C
Hgb A1c MFr Bld: 9.4 % — ABNORMAL HIGH (ref 4.8–5.6)
Mean Plasma Glucose: 223.08 mg/dL

## 2024-02-17 LAB — SODIUM: Sodium: 156 mmol/L — ABNORMAL HIGH (ref 135–145)

## 2024-02-17 LAB — T4, FREE: Free T4: 1.72 ng/dL (ref 0.80–2.00)

## 2024-02-17 MED ORDER — SODIUM CHLORIDE 0.45 % IV SOLN: INTRAVENOUS | Status: DC

## 2024-02-17 MED ORDER — SODIUM CHLORIDE 0.9 % IV SOLN
1.0000 g | INTRAVENOUS | Status: AC
  Administered 2024-02-17 – 2024-02-18 (×2): 1 g via INTRAVENOUS
  Filled 2024-02-17 (×2): qty 10

## 2024-02-17 MED ORDER — LEVOTHYROXINE SODIUM 75 MCG PO TABS
150.0000 ug | ORAL_TABLET | Freq: Every day | ORAL | Status: AC
  Administered 2024-02-17: 150 ug via ORAL
  Filled 2024-02-17: qty 2

## 2024-02-17 MED ORDER — PANTOPRAZOLE SODIUM 40 MG IV SOLR
40.0000 mg | INTRAVENOUS | Status: AC
  Administered 2024-02-17 – 2024-02-18 (×2): 40 mg via INTRAVENOUS
  Filled 2024-02-17 (×2): qty 10

## 2024-02-17 MED ORDER — HYDRALAZINE HCL 20 MG/ML IJ SOLN
10.0000 mg | Freq: Four times a day (QID) | INTRAMUSCULAR | Status: AC | PRN
  Administered 2024-02-18: 10 mg via INTRAVENOUS
  Filled 2024-02-17: qty 1

## 2024-02-17 MED ORDER — INSULIN ASPART PROT & ASPART (70-30 MIX) 100 UNIT/ML ~~LOC~~ SUSP
8.0000 [IU] | Freq: Two times a day (BID) | SUBCUTANEOUS | Status: AC
  Filled 2024-02-17 (×2): qty 10

## 2024-02-17 MED ORDER — METOPROLOL TARTRATE 5 MG/5ML IV SOLN
5.0000 mg | Freq: Three times a day (TID) | INTRAVENOUS | Status: DC
  Administered 2024-02-17 – 2024-02-18 (×3): 5 mg via INTRAVENOUS
  Filled 2024-02-17 (×3): qty 5

## 2024-02-17 MED ORDER — INSULIN ASPART PROT & ASPART (70-30 MIX) 100 UNIT/ML ~~LOC~~ SUSP
15.0000 [IU] | Freq: Two times a day (BID) | SUBCUTANEOUS | Status: DC
  Filled 2024-02-17: qty 10

## 2024-02-17 NOTE — Inpatient Diabetes Management (Addendum)
 Inpatient Diabetes Program Recommendations  AACE/ADA: New Consensus Statement on Inpatient Glycemic Control (2015)  Target Ranges:  Prepandial:   less than 140 mg/dL      Peak postprandial:   less than 180 mg/dL (1-2 hours)      Critically ill patients:  140 - 180 mg/dL   Lab Results  Component Value Date   GLUCAP 233 (H) 02/17/2024   HGBA1C 9.4 (H) 02/16/2024    Review of Glycemic Control  Latest Reference Range & Units 02/17/24 01:20 02/17/24 08:01  Glucose-Capillary 70 - 99 mg/dL 678 (H) 766 (H)  (H): Data is abnormally high Diabetes history: Type 2 DM Outpatient Diabetes medications:  Current orders for Inpatient glycemic control: Novolog  70/30 15 units BID, Novolog  0-9 units TID & HS  Inpatient Diabetes Program Recommendations:    With poor oral intake, change insulin  to NPH 8 units BID. Secure chat sent to MD.   Thanks, Tinnie Minus, MSN, RNC-OB Diabetes Coordinator [phone] (8a-5p)

## 2024-02-17 NOTE — ED Notes (Signed)
 In and out catheter performed with Shaun -RN, patient tolerated well. output, cloudy with sediment. Peri care and hand hygiene performed.

## 2024-02-17 NOTE — Progress Notes (Signed)
 "          [facility]                                                                              Allison [name], is a 89 y.o. female, DOB - Feb 13, 1934, FMW:[id number] Admit date - 02/16/2024    Outpatient Primary MD for the patient is [name], [name], DO  LOS - 1  days  Chief Complaint  Patient presents with   Fatigue       Brief summary   Patient is a 89 year old female with HTN, hyperlipidemia, DM type II, prior CVA, PAD, hypothyroidism, diastolic CHF, advanced dementia presented from [facility] due to concern for poor p.o. intake for the last 3 days prior to admission with generalized deconditioning and debility.  No fever, cough, chest pain, shortness of breath, any problem with urination or with bowel movement.  Patient has advanced dementia and is unable to provide history. In ED, sodium 162, trended up to 165, glucose 342, BUN 58, creatinine 1.3, trended up to 1.4, calcium  10.5, anion gap 18, osmolality 376. WBCs 13.6, hemoglobin 14.Hct 46.8  Assessment & Plan   Failure to thrive in adult/hypovolemic hypernatremia/dehydration, POA: - Appears to be a recurrent issue, recently hospitalized and discharged on 01/04/2024 for similar complaint - Patient appears to be aspirating when drinking water, placed on n.p.o. status, continuing IV fluids, SLP evaluation - Given advanced dementia, age, FTT, dysphagia/aspiration, palliative medicine consulted for goals of care. - For now continue half-normal saline, serial sodium  FTT, dysphagia/aspiration in the setting of advanced dementia -SLP evaluation, continue IVF, n.p.o.   UTI, leukocytosis  - UA showed large leukocytes, many bacteria, WBCs >50 -Send off urine culture from the UA sample, start IV Rocephin   Essential hypertension:  - BP currently elevated, holding p.o. meds due to aspiration  -Hold Coreg , changed to Lopressor  5 mg IV every 8 hours - Will place on IV hydralazine  as needed with parameters   Uncontrolled  type 2 diabetes mellitus with hyperglycemia: Hemoglobin A1c 9.4  CBG (last 3)  Recent Labs    02/16/24 1935 02/17/24 0120 02/17/24 0801  GLUCAP 307* 321* 233*   -Start NovoLog  70/30 insulin  8 units twice daily, SSI  Diabetic coordinator consulted   Acquired hypothyroidism: - On Synthroid  125 mcg daily, TSH 4. 5 9, increase Synthroid  150 mcg daily   Hyperlipidemia:  Resume statin.   GERD:  Continue PPI.   Advanced dementia: - Does not appear to be on any medications. Palliative medicine consulted for GOC   Estimated body mass index is 21.57 kg/m as calculated from the following:   Height as of this encounter: 5' 4 (1.626 m).   Weight as of this encounter: 57 kg.  Code Status: DNR DVT Prophylaxis:  heparin  injection 5,000 Units Start: 02/17/24 1400   Level of Care: Level of care: Progressive Family Communication: No family at the bedside Disposition Plan:      Remains inpatient appropriate:      Procedures:    Consultants:     Antimicrobials:   Anti-infectives (From admission, onward)    None          Medications  aspirin   81  mg Oral Daily   atorvastatin   40 mg Oral Daily   carvedilol   6.25 mg Oral BID WC   heparin   5,000 Units Subcutaneous Q8H   insulin  aspart  0-5 Units Subcutaneous QHS   insulin  aspart  0-9 Units Subcutaneous TID WC   insulin  aspart protamine- aspart  8 Units Subcutaneous BID WC   levothyroxine   150 mcg Oral Q0600   melatonin  5 mg Oral QHS   mirtazapine   7.5 mg Oral QHS   pantoprazole   20 mg Oral Daily      Subjective:   Allison [name] was seen and examined today.  Alert and oriented to self but not to place or person.  Difficult to obtain ROS from the patient.  No nausea vomiting, chest pain or shortness of breath.   Objective:   Vitals:   02/17/24 0631 02/17/24 0700 02/17/24 0800 02/17/24 1030  BP: (!) 181/94 (!) 166/95 (!) 171/85 (!) 163/91  Pulse: 91 84 90 87  Resp: 14 14 15 17   Temp: 97.6 F (36.4 C)      TempSrc: Axillary     SpO2: 98% 98% 96% 99%  Weight:      Height:        Intake/Output Summary (Last 24 hours) at 02/17/2024 1113 Last data filed at 02/17/2024 0120 Gross per 24 hour  Intake 500 ml  Output 1200 ml  Net -700 ml     Wt Readings from Last 3 Encounters:  02/16/24 57 kg  03/28/23 57 kg  01/31/23 57 kg     Exam General: Alert and oriented to self, NAD, appears comfortable, dry mucosal membranes Cardiovascular: S1 S2 auscultated,  RRR Respiratory: Clear to auscultation bilaterally, no wheezing Gastrointestinal: Soft, nontender, nondistended, + bowel sounds Ext: no pedal edema bilaterally Neuro: Does not follow commands Psych: Advanced dementia    Data Reviewed:  I have personally reviewed following labs    CBC Lab Results  Component Value Date   WBC 13.3 (H) 02/17/2024   RBC 5.09 02/17/2024   HGB 13.5 02/17/2024   HCT 43.3 02/17/2024   MCV 85.1 02/17/2024   MCH 26.5 02/17/2024   PLT 278 02/17/2024   MCHC 31.2 02/17/2024   RDW 18.4 (H) 02/17/2024   LYMPHSABS 3.0 02/16/2024   MONOABS 1.4 (H) 02/16/2024   EOSABS 0.0 02/16/2024   BASOSABS 0.1 02/16/2024     Last metabolic panel Lab Results  Component Value Date   NA 164 (HH) 02/17/2024   K 3.6 02/17/2024   CL 126 (H) 02/17/2024   CO2 26 02/17/2024   BUN 51 (H) 02/17/2024   CREATININE 1.28 (H) 02/17/2024   GLUCOSE 341 (H) 02/17/2024   GFRNONAA 40 (L) 02/17/2024   GFRAA >60 03/27/2019   CALCIUM  10.1 02/17/2024   PHOS 3.0 11/24/2022   PROT 7.9 02/16/2024   ALBUMIN 4.2 02/16/2024   LABGLOB 3.0 05/22/2015   AGRATIO 1.3 05/22/2015   BILITOT 0.7 02/16/2024   ALKPHOS 87 02/16/2024   AST 16 02/16/2024   ALT 11 02/16/2024   ANIONGAP 12 02/17/2024    CBG (last 3)  Recent Labs    02/16/24 1935 02/17/24 0120 02/17/24 0801  GLUCAP 307* 321* 233*      Coagulation Profile: No results for input(s): INR, PROTIME in the last 168 hours.   Radiology Studies: I have personally reviewed  the imaging studies  No results found.     Nydia Distance M.D. [facility] 02/17/2024, 11:13 AM  Available via Epic secure chat 7am-7pm After 7  pm, please refer to night coverage provider listed on amion.    "

## 2024-02-17 NOTE — ED Notes (Signed)
 Could not get temp oral.

## 2024-02-17 NOTE — Progress Notes (Addendum)
 SLP Cancellation Note  Patient Details Name: Allison [name] MRN: [id number] DOB: Jun 09, 1934   Cancelled treatment:       Reason Eval/Treat Not Completed: Fatigue/lethargy limiting ability to participate;Patient's level of consciousness  Unable to complete BSE at this time. RN reports pt is lethargic. Previous BSE in December 2025 recommended Dys3/thin liquid diet with crushed meds. Pt currently NPO. Will continue efforts to assess swallow function and safety.   Phyllicia Dudek B. Dory, MSP, CCC-SLP Speech Language Pathologist Office: [phone]  Dory Caprice Daring 02/17/2024, 12:00 PM

## 2024-02-17 NOTE — ED Notes (Signed)
 Patient cleaned of incontinence, peri care performed, new brief placed, and patient repositioned for comfort.

## 2024-02-17 NOTE — ED Notes (Signed)
 Rai MD made aware of pt coughing when drinking water and water spilling out of pt mouth. Speech eval order placed. Oral meds held until eval complete.

## 2024-02-17 NOTE — Consult Note (Signed)
 "                                                  Palliative Care Consult Note                                  Date: 02/17/2024   Patient Name: Allison [name]  DOB: 06/15/1934  MRN: [id number]  Age / Sex: 89 y.o., female  PCP: [name], DO Referring Physician: Davia Nydia POUR, MD  Reason for Consultation: {Reason for Consult:23484}  Past Medical History:  Diagnosis Date   Acute ischemic stroke (HCC) 10/29/2015   Acute metabolic encephalopathy 08/24/2022   Carpal tunnel syndrome    CVA (cerebral vascular accident) (HCC) 10/28/2015   Dementia (HCC)    Diabetes mellitus without complication (HCC)    Diabetic ketoacidosis (HCC) 08/16/2022   Dysphagia    GERD (gastroesophageal reflux disease)    Hiatal hernia    HLD (hyperlipidemia)    Hx of adenomatous colonic polyps 06/19/2014   Hyperlipidemia    Hypertension    Hypothyroidism    Ischemic stroke (HCC)    Onychomycosis 03/05/2012   Pain in lower limb 04/18/2013   Sciatic nerve pain    Sepsis (HCC) 11/22/2022   Sepsis secondary to UTI (HCC) 08/24/2022   Stroke-like symptoms 10/28/2015   Vertigo     Subjective:   This NP Camellia Kays reviewed medical records, received report from team, assessed the patient and then meet at the patient's bedside to discuss diagnosis, prognosis, GOC, EOL wishes disposition and options.  Before meeting with the patient/family, I spent time reviewing the chart notes including ***. I also reviewed vital signs, nursing flowsheets, medication administrations record, labs, and imaging. Labs reviewed include ***.  I met with ***.   We meet to discuss diagnosis prognosis, GOC, EOL wishes, disposition and options. Concept of Palliative Care was introduced as specialized medical care for people and their families living with serious illness.  If focuses on providing relief from the symptoms and stress of a serious illness.  The goal is to improve quality of life for both the patient and the family.  Values and goals of care important to patient and family were attempted to be elicited.  Created space and opportunity for patient  and family to explore thoughts and feelings regarding current medical situation   Natural trajectory and current clinical status were discussed. Questions and concerns addressed. Patient  encouraged to call with questions or concerns.    Patient/Family Understanding of Illness: ***  Life Review: ***  Patient Values: ***  Baseline Status: ***  Today's Discussion: ***  Goals: ***  Review of Systems  Objective:   Primary Diagnoses: Present on Admission:  Dehydration with hypernatremia   Vital Signs:  BP (!) 171/85   Pulse 90   Temp 97.6 F (36.4 C) (Axillary)   Resp 15   Ht 5' 4 (1.626 m)   Wt 57 kg   SpO2 96%   BMI 21.57 kg/m   Physical Exam  Palliative Assessment/Data: ***   Advanced Care Planning:   Existing Vynca/ACP Documentation: ***  Primary Decision Maker: {Primary Decision Fjxzm:78612}  Pertinent diagnosis: ***  The patient and/or family consented to a voluntary Advance Care Planning Conversation in person/over the phone***. Individuals  present for the conversation: ***  Summary of the conversation: ***  Outcome of the conversations and/or documents completed: ***  I spent *** minutes providing separately identifiable ACP services with the patient and/or surrogate decision maker in a voluntary, in-person conversation discussing the patient's wishes and goals as detailed in the above note.  Assessment & Plan:   HPI/Patient Profile: 89 y.o. female  with past medical history of *** admitted on 02/16/2024 with ***.   SUMMARY OF RECOMMENDATIONS   ***  Symptom Management:  ***  Code Status: {Updated Palliative Code Status:33307}  Prognosis:  {Palliative Care Prognosis:23504}  Discharge Planning:  {Palliative dispostion:23505}   Discussed with: ***    Thank you for allowing us  to participate in the  care of [name] PMT will continue to support holistically.  Time Total: ***  Detailed review of medical records (labs, imaging, vital signs), medically appropriate exam, discussed with treatment team, counseling and education to patient, family, & staff, documenting clinical information, medication management, coordination of care  Signed by: Camellia Kays, NP Palliative Medicine Team  Team Phone # [phone] (Nights/Weekends)  02/17/2024, 10:11 AM  "

## 2024-02-18 LAB — SODIUM
Sodium: 161 mmol/L (ref 135–145)
Sodium: 154 mmol/L — ABNORMAL HIGH (ref 135–145)
Sodium: 156 mmol/L — ABNORMAL HIGH (ref 135–145)

## 2024-02-18 LAB — BASIC METABOLIC PANEL WITH GFR
Anion gap: 13 (ref 5–15)
BUN: 30 mg/dL — ABNORMAL HIGH (ref 8–23)
CO2: 21 mmol/L — ABNORMAL LOW (ref 22–32)
Calcium: 9.4 mg/dL (ref 8.9–10.3)
Chloride: 126 mmol/L — ABNORMAL HIGH (ref 98–111)
Creatinine, Ser: 0.91 mg/dL (ref 0.44–1.00)
GFR, Estimated: >60 mL/min
Glucose, Bld: 177 mg/dL — ABNORMAL HIGH (ref 70–99)
Potassium: 4 mmol/L (ref 3.5–5.1)
Sodium: 160 mmol/L — ABNORMAL HIGH (ref 135–145)

## 2024-02-18 LAB — CBG MONITORING, ED
Glucose-Capillary: 208 mg/dL — ABNORMAL HIGH (ref 70–99)
Glucose-Capillary: 263 mg/dL — ABNORMAL HIGH (ref 70–99)

## 2024-02-18 LAB — GLUCOSE, CAPILLARY
Glucose-Capillary: 209 mg/dL — ABNORMAL HIGH (ref 70–99)
Glucose-Capillary: 275 mg/dL — ABNORMAL HIGH (ref 70–99)

## 2024-02-18 LAB — URINE CULTURE: Culture: >=100000 — AB

## 2024-02-18 MED ORDER — AMLODIPINE BESYLATE 10 MG PO TABS
10.0000 mg | ORAL_TABLET | Freq: Every day | ORAL | Status: AC
  Administered 2024-02-18: 10 mg via ORAL
  Filled 2024-02-18: qty 1

## 2024-02-18 MED ORDER — CARVEDILOL 6.25 MG PO TABS
6.2500 mg | ORAL_TABLET | Freq: Two times a day (BID) | ORAL | Status: AC
  Administered 2024-02-18: 6.25 mg via ORAL
  Filled 2024-02-18: qty 1

## 2024-02-18 MED ORDER — DEXTROSE-SODIUM CHLORIDE 5-0.45 % IV SOLN: INTRAVENOUS | Status: AC

## 2024-02-18 MED ORDER — ENSURE PLUS HIGH PROTEIN PO LIQD: 237.0000 mL | Freq: Two times a day (BID) | ORAL | Status: AC

## 2024-02-18 NOTE — Evaluation (Signed)
 Clinical/Bedside Swallow Evaluation Patient Details  Name: MAKHYA ARAVE MRN: [id number] Date of Birth: 1934-01-25  Today's Date: 02/18/2024 Time: SLP Start Time (ACUTE ONLY): 1016 SLP Stop Time (ACUTE ONLY): 1033 SLP Time Calculation (min) (ACUTE ONLY): 17 min  Past Medical History:  Past Medical History:  Diagnosis Date   Acute ischemic stroke (HCC) 10/29/2015   Acute metabolic encephalopathy 08/24/2022   Carpal tunnel syndrome    CVA (cerebral vascular accident) (HCC) 10/28/2015   Dementia (HCC)    Diabetes mellitus without complication (HCC)    Diabetic ketoacidosis (HCC) 08/16/2022   Dysphagia    GERD (gastroesophageal reflux disease)    Hiatal hernia    HLD (hyperlipidemia)    Hx of adenomatous colonic polyps 06/19/2014   Hyperlipidemia    Hypertension    Hypothyroidism    Ischemic stroke (HCC)    Onychomycosis 03/05/2012   Pain in lower limb 04/18/2013   Sciatic nerve pain    Sepsis (HCC) 11/22/2022   Sepsis secondary to UTI (HCC) 08/24/2022   Stroke-like symptoms 10/28/2015   Vertigo    Past Surgical History:  Past Surgical History:  Procedure Laterality Date   BLADDER SURGERY     Mesh implant   COLONOSCOPY     LARYNX SURGERY     vocal cords   PARTIAL HYSTERECTOMY  1978   VESICOVAGINAL FISTULA CLOSURE W/ TAH  1976   HPI:  89yo female admitted from [facility] 02/16/24 with poor PO intake, deconditioning and debility. Dx UTI. BSE 01/01/24: Dys3/thin, crush meds. PMHx: advanced dementia, GERD, hiatal hernia, dysphagia, HTN, HLD, DM2, PAD, hypothyroidism, CVA, diastolic CHF, GERD, depression, anxiety, neuropathy.    Assessment / Plan / Recommendation  Clinical Impression  PLAN: Start a dysphagia 1 diet with thin liquids; crush meds in applesauce. Will need to be fed by staff.   There were no concerns for aspiration today.  In the long term, her dependency on others for feeding and oral care in the setting of advanced dementia increase her susceptibility to  developing an aspiration-related pna, however, she is swallowing quite well today.    Pt was alert and participatory.  No focal CN deficits; dentures present.  She accepted sips of water and consumed 6 oz through a straw with no s/s of aspiration.  She was able to eat applesauce with intermittent oral holding, requiring occasional verbal cues.  Once MS returns to baseline, diet can likely be advanced to dysphagia 2 or 3. SLP will follow. D/W RN.   SLP Visit Diagnosis: Dysphagia, oral phase (R13.11)    Aspiration Risk  No limitations    Diet Recommendation   Thin;Dysphagia 1 (puree)  Medication Administration: Crushed with puree    Other Recommendations Oral Care Recommendations: Oral care QID      Functional Status Assessment    Frequency and Duration min 1 x/week  1 week       Prognosis Prognosis for improved oropharyngeal function: Good      Swallow Study   General Date of Onset: 02/16/24 HPI: 89yo female admitted from [facility] 02/16/24 with poor PO intake, deconditioning and debility. Dx UTI. BSE 01/01/24: Dys3/thin, crush meds. PMHx: advanced dementia, GERD, hiatal hernia, dysphagia, HTN, HLD, DM2, PAD, hypothyroidism, CVA, diastolic CHF, GERD, depression, anxiety, neuropathy. Type of Study: Bedside Swallow Evaluation Previous Swallow Assessment: 12/2023: Dys3/thin, crushed meds. Diet Prior to this Study: NPO Temperature Spikes Noted: No Respiratory Status: Room air History of Recent Intubation: No Behavior/Cognition: Alert;Cooperative Oral Cavity Assessment: Within Functional Limits Oral Care Completed  by SLP: Recent completion by staff Self-Feeding Abilities: Total assist Patient Positioning: Upright in bed Baseline Vocal Quality: Normal Volitional Cough: Cognitively unable to elicit Volitional Swallow: Unable to elicit    Oral/Motor/Sensory Function Overall Oral Motor/Sensory Function:  (symmetric at baseline, not following commands)   Ice Chips Ice chips: Not  tested   Thin Liquid Thin Liquid: Within functional limits    Nectar Thick Nectar Thick Liquid: Not tested   Honey Thick Honey Thick Liquid: Not tested   Puree Puree: Within functional limits   Solid     Solid: Not tested      Vona Palma Laurice 02/18/2024,10:50 AM  Palma L. Vona, MA CCC/SLP Clinical Specialist - Acute Care SLP Acute Rehabilitation Services Office number [phone]

## 2024-02-18 NOTE — Progress Notes (Signed)
 "          [facility]                                                                              Yasuko Lapage, is a 89 y.o. female, DOB - Mar 28, 1934, FMW:[id number] Admit date - 02/16/2024    Outpatient Primary MD for the patient is [name], [name], DO  LOS - 2  days  Chief Complaint  Patient presents with   Fatigue       Brief summary   Patient is a 89 year old female with HTN, hyperlipidemia, DM type II, prior CVA, PAD, hypothyroidism, diastolic CHF, advanced dementia presented from [facility] due to concern for poor p.o. intake for the last 3 days prior to admission with generalized deconditioning and debility.  No fever, cough, chest pain, shortness of breath, any problem with urination or with bowel movement.  Patient has advanced dementia and is unable to provide history. In ED, sodium 162, trended up to 165, glucose 342, BUN 58, creatinine 1.3, trended up to 1.4, calcium  10.5, anion gap 18, osmolality 376. WBCs 13.6, hemoglobin 14.Hct 46.8  Assessment & Plan   Hypovolemic hypernatremia/dehydration, POA: - Appears to be a recurrent issue, recently hospitalized and discharged on 01/04/2024 for similar complaint. Na 164 - Appeared to be aspirating, SLP eval dysphagia 1 diet - Na 160, changed IV fluids to D5 half-normal saline, follow sodium every 8 hours  FTT, dysphagia/aspiration in the setting of advanced dementia -SLP evaluation recommended dysphagia 1 diet - Given advanced dementia, age, FTT, dysphagia/aspiration, palliative medicine consulted for goals of care. - Patient much more alert and awake today   UTI, leukocytosis  - urine culture showed more than 100,000 colonies of GPC - Continue IV Rocephin , follow urine culture results  Essential hypertension:  -BP readings elevated, resume amlodipine  10 mg daily, Coreg  6.25 mg twice daily  - Continue IV hydralazine  as needed with parameters   Uncontrolled type 2 diabetes mellitus with  hyperglycemia: Hemoglobin A1c 9.4  CBG (last 3)  Recent Labs    02/17/24 2152 02/18/24 0814 02/18/24 1150  GLUCAP 175* 208* 263*   - Continue NovoLog  70/30 insulin  8 units twice daily, SSI CBG is expected to rise with the D5 half-normal saline, will need increase in insulin  regimen.  Diabetic coordinator consulted    Acquired hypothyroidism: - On Synthroid  125 mcg daily, TSH 4. 5 9, increase Synthroid  150 mcg daily   Hyperlipidemia:  Resume statin.   GERD:  Continue PPI.   Advanced dementia: - Does not appear to be on any medications. Palliative medicine consulted for GOC   Estimated body mass index is 21.57 kg/m as calculated from the following:   Height as of this encounter: 5' 4 (1.626 m).   Weight as of this encounter: 57 kg.  Code Status: DNR DVT Prophylaxis:  heparin  injection 5,000 Units Start: 02/17/24 1400   Level of Care: Level of care: Progressive Family Communication: No family at the bedside Disposition Plan:      Remains inpatient appropriate:      Procedures:    Consultants:     Antimicrobials:   Anti-infectives (From admission, onward)    Start  Dose/Rate Route Frequency Ordered Stop   02/17/24 1130  cefTRIAXone  (ROCEPHIN ) 1 g in sodium chloride  0.9 % 100 mL IVPB        1 g 200 mL/hr over 30 Minutes Intravenous Every 24 hours 02/17/24 1125            Medications  aspirin   81 mg Oral Daily   atorvastatin   40 mg Oral Daily   heparin   5,000 Units Subcutaneous Q8H   insulin  aspart  0-5 Units Subcutaneous QHS   insulin  aspart  0-9 Units Subcutaneous TID WC   insulin  aspart protamine- aspart  8 Units Subcutaneous BID WC   levothyroxine   150 mcg Oral Q0600   melatonin  5 mg Oral QHS   metoprolol  tartrate  5 mg Intravenous Q8H   mirtazapine   7.5 mg Oral QHS   pantoprazole  (PROTONIX ) IV  40 mg Intravenous Q24H      Subjective:   Jaelin Devincentis was seen and examined today.  Much more alert and awake today, answering few  questions.  No acute nausea vomiting, chest pain, shortness of breath, fevers.    Objective:   Vitals:   02/18/24 0530 02/18/24 0600 02/18/24 0720 02/18/24 0930  BP: (!) 150/85 (!) 156/97  (!) 166/142  Pulse: 89 89  90  Resp: 14 12  12   Temp:   97.7 F (36.5 C)   TempSrc:   Oral   SpO2: 100% 100%  100%  Weight:      Height:        Intake/Output Summary (Last 24 hours) at 02/18/2024 1333 Last data filed at 02/18/2024 1117 Gross per 24 hour  Intake 1927.33 ml  Output --  Net 1927.33 ml     Wt Readings from Last 3 Encounters:  02/16/24 57 kg  03/28/23 57 kg  01/31/23 57 kg   Physical Exam General: Alert and oriented x self, NAD, Cardiovascular: S1 S2 clear, RRR.  Respiratory: CTAB, no wheezing Gastrointestinal: Soft, nontender, nondistended, NBS Ext: no pedal edema bilaterally Psych: Alert and awake today, has advanced dementia  Data Reviewed:  I have personally reviewed following labs    CBC Lab Results  Component Value Date   WBC 13.3 (H) 02/17/2024   RBC 5.09 02/17/2024   HGB 13.5 02/17/2024   HCT 43.3 02/17/2024   MCV 85.1 02/17/2024   MCH 26.5 02/17/2024   PLT 278 02/17/2024   MCHC 31.2 02/17/2024   RDW 18.4 (H) 02/17/2024   LYMPHSABS 3.0 02/16/2024   MONOABS 1.4 (H) 02/16/2024   EOSABS 0.0 02/16/2024   BASOSABS 0.1 02/16/2024     Last metabolic panel Lab Results  Component Value Date   NA 160 (H) 02/18/2024   K 4.0 02/18/2024   CL 126 (H) 02/18/2024   CO2 21 (L) 02/18/2024   BUN 30 (H) 02/18/2024   CREATININE 0.91 02/18/2024   GLUCOSE 177 (H) 02/18/2024   GFRNONAA >60 02/18/2024   GFRAA >60 03/27/2019   CALCIUM  9.4 02/18/2024   PHOS 3.0 11/24/2022   PROT 7.9 02/16/2024   ALBUMIN 4.2 02/16/2024   LABGLOB 3.0 05/22/2015   AGRATIO 1.3 05/22/2015   BILITOT 0.7 02/16/2024   ALKPHOS 87 02/16/2024   AST 16 02/16/2024   ALT 11 02/16/2024   ANIONGAP 13 02/18/2024    CBG (last 3)  Recent Labs    02/17/24 2152 02/18/24 0814 02/18/24 1150   GLUCAP 175* 208* 263*      Coagulation Profile: No results for input(s): INR, PROTIME in the last 168 hours.  Radiology Studies: I have personally reviewed the imaging studies  No results found.     Nydia Distance M.D. [facility] 02/18/2024, 1:33 PM  Available via Epic secure chat 7am-7pm After 7 pm, please refer to night coverage provider listed on amion.    "

## 2024-02-18 NOTE — ED Notes (Signed)
 Dr. Shona was made aware through e-chat the result of the last Sodium level of 161.
